# Patient Record
Sex: Female | Born: 1935 | Race: White | Hispanic: No | Smoking: Never smoker
Health system: Southern US, Community
[De-identification: ages and names within clinical notes are randomized; demographics above are authoritative.]

## PROBLEM LIST (undated history)

## (undated) DIAGNOSIS — N189 Chronic kidney disease, unspecified: Secondary | ICD-10-CM

## (undated) DIAGNOSIS — I214 Non-ST elevation (NSTEMI) myocardial infarction: Secondary | ICD-10-CM

## (undated) DIAGNOSIS — E119 Type 2 diabetes mellitus without complications: Secondary | ICD-10-CM

## (undated) DIAGNOSIS — Z8673 Personal history of transient ischemic attack (TIA), and cerebral infarction without residual deficits: Secondary | ICD-10-CM

## (undated) DIAGNOSIS — I509 Heart failure, unspecified: Secondary | ICD-10-CM

## (undated) DIAGNOSIS — M8580 Other specified disorders of bone density and structure, unspecified site: Secondary | ICD-10-CM

## (undated) DIAGNOSIS — I2510 Atherosclerotic heart disease of native coronary artery without angina pectoris: Secondary | ICD-10-CM

## (undated) DIAGNOSIS — C50919 Malignant neoplasm of unspecified site of unspecified female breast: Secondary | ICD-10-CM

## (undated) DIAGNOSIS — E782 Mixed hyperlipidemia: Secondary | ICD-10-CM

## (undated) DIAGNOSIS — D649 Anemia, unspecified: Secondary | ICD-10-CM

## (undated) DIAGNOSIS — K219 Gastro-esophageal reflux disease without esophagitis: Secondary | ICD-10-CM

## (undated) DIAGNOSIS — G510 Bell's palsy: Secondary | ICD-10-CM

## (undated) DIAGNOSIS — I480 Paroxysmal atrial fibrillation: Secondary | ICD-10-CM

## (undated) DIAGNOSIS — M109 Gout, unspecified: Secondary | ICD-10-CM

## (undated) DIAGNOSIS — K2100 Gastro-esophageal reflux disease with esophagitis, without bleeding: Secondary | ICD-10-CM

## (undated) DIAGNOSIS — I10 Essential (primary) hypertension: Secondary | ICD-10-CM

## (undated) DIAGNOSIS — Z85828 Personal history of other malignant neoplasm of skin: Secondary | ICD-10-CM

## (undated) DIAGNOSIS — E041 Nontoxic single thyroid nodule: Secondary | ICD-10-CM

## (undated) HISTORY — DX: Mixed hyperlipidemia: E78.2

## (undated) HISTORY — DX: Personal history of transient ischemic attack (TIA), and cerebral infarction without residual deficits: Z86.73

## (undated) HISTORY — DX: Bell's palsy: G51.0

## (undated) HISTORY — DX: Personal history of other malignant neoplasm of skin: Z85.828

## (undated) HISTORY — DX: Type 2 diabetes mellitus without complications: E11.9

## (undated) HISTORY — DX: Malignant neoplasm of unspecified site of unspecified female breast: C50.919

## (undated) HISTORY — DX: Heart failure, unspecified: I50.9

## (undated) HISTORY — DX: Gout, unspecified: M10.9

## (undated) HISTORY — DX: Nontoxic single thyroid nodule: E04.1

## (undated) HISTORY — PX: APPENDECTOMY: SHX54

## (undated) HISTORY — DX: Gastro-esophageal reflux disease with esophagitis, without bleeding: K21.00

## (undated) HISTORY — DX: Gastro-esophageal reflux disease without esophagitis: K21.9

## (undated) HISTORY — DX: Other specified disorders of bone density and structure, unspecified site: M85.80

## (undated) HISTORY — DX: Essential (primary) hypertension: I10

## (undated) HISTORY — DX: Gastro-esophageal reflux disease with esophagitis: K21.0

## (undated) HISTORY — DX: Atherosclerotic heart disease of native coronary artery without angina pectoris: I25.10

## (undated) HISTORY — DX: Paroxysmal atrial fibrillation: I48.0

## (undated) HISTORY — PX: HYSTEROSCOPY: SHX211

## (undated) HISTORY — PX: ABDOMINAL HYSTERECTOMY: SHX81

---

## 1996-01-28 DIAGNOSIS — C50919 Malignant neoplasm of unspecified site of unspecified female breast: Secondary | ICD-10-CM

## 1996-01-28 HISTORY — DX: Malignant neoplasm of unspecified site of unspecified female breast: C50.919

## 1996-01-28 HISTORY — PX: OTHER SURGICAL HISTORY: SHX169

## 2011-02-28 DIAGNOSIS — E041 Nontoxic single thyroid nodule: Secondary | ICD-10-CM

## 2011-02-28 HISTORY — DX: Nontoxic single thyroid nodule: E04.1

## 2011-03-04 DIAGNOSIS — I1A0 Resistant hypertension: Secondary | ICD-10-CM | POA: Diagnosis present

## 2011-03-04 DIAGNOSIS — E119 Type 2 diabetes mellitus without complications: Secondary | ICD-10-CM

## 2011-03-04 DIAGNOSIS — I10 Essential (primary) hypertension: Secondary | ICD-10-CM | POA: Diagnosis present

## 2011-03-04 DIAGNOSIS — E785 Hyperlipidemia, unspecified: Secondary | ICD-10-CM | POA: Diagnosis present

## 2011-03-18 ENCOUNTER — Other Ambulatory Visit: Payer: Self-pay | Admitting: Endocrinology

## 2011-03-18 DIAGNOSIS — E042 Nontoxic multinodular goiter: Secondary | ICD-10-CM

## 2011-04-02 ENCOUNTER — Ambulatory Visit
Admission: RE | Admit: 2011-04-02 | Discharge: 2011-04-02 | Disposition: A | Discharge status: Home / Self Care | Payer: Medicare Other | Source: Ambulatory Visit | Attending: Endocrinology | Admitting: Endocrinology

## 2011-04-02 ENCOUNTER — Other Ambulatory Visit (HOSPITAL_COMMUNITY)
Admission: RE | Admit: 2011-04-02 | Discharge: 2011-04-02 | Disposition: A | Discharge status: Home / Self Care | Payer: Medicare Other | Source: Ambulatory Visit | Attending: Interventional Radiology | Admitting: Interventional Radiology

## 2011-04-02 DIAGNOSIS — E042 Nontoxic multinodular goiter: Secondary | ICD-10-CM

## 2011-04-02 DIAGNOSIS — E049 Nontoxic goiter, unspecified: Secondary | ICD-10-CM | POA: Insufficient documentation

## 2012-02-12 ENCOUNTER — Other Ambulatory Visit: Payer: Self-pay | Admitting: Endocrinology

## 2012-02-12 DIAGNOSIS — E049 Nontoxic goiter, unspecified: Secondary | ICD-10-CM

## 2012-04-05 ENCOUNTER — Ambulatory Visit
Admission: RE | Admit: 2012-04-05 | Discharge: 2012-04-05 | Disposition: A | Discharge status: Home / Self Care | Payer: Medicare Other | Source: Ambulatory Visit | Attending: Endocrinology | Admitting: Endocrinology

## 2012-04-05 DIAGNOSIS — E049 Nontoxic goiter, unspecified: Secondary | ICD-10-CM

## 2014-01-27 HISTORY — PX: CATARACT EXTRACTION: SUR2

## 2014-07-20 ENCOUNTER — Other Ambulatory Visit: Payer: Self-pay | Admitting: Endocrinology

## 2014-07-20 DIAGNOSIS — E049 Nontoxic goiter, unspecified: Secondary | ICD-10-CM

## 2014-09-29 ENCOUNTER — Other Ambulatory Visit: Payer: Medicare Other

## 2015-02-20 DIAGNOSIS — H25041 Posterior subcapsular polar age-related cataract, right eye: Secondary | ICD-10-CM | POA: Diagnosis not present

## 2015-02-20 DIAGNOSIS — H21561 Pupillary abnormality, right eye: Secondary | ICD-10-CM | POA: Diagnosis not present

## 2015-02-20 DIAGNOSIS — H2511 Age-related nuclear cataract, right eye: Secondary | ICD-10-CM | POA: Diagnosis not present

## 2015-02-20 DIAGNOSIS — H25811 Combined forms of age-related cataract, right eye: Secondary | ICD-10-CM | POA: Diagnosis not present

## 2015-04-13 DIAGNOSIS — I10 Essential (primary) hypertension: Secondary | ICD-10-CM | POA: Diagnosis not present

## 2015-04-13 DIAGNOSIS — E119 Type 2 diabetes mellitus without complications: Secondary | ICD-10-CM | POA: Diagnosis not present

## 2015-04-23 DIAGNOSIS — I10 Essential (primary) hypertension: Secondary | ICD-10-CM | POA: Diagnosis not present

## 2015-04-23 DIAGNOSIS — E1165 Type 2 diabetes mellitus with hyperglycemia: Secondary | ICD-10-CM | POA: Diagnosis not present

## 2015-04-23 DIAGNOSIS — Z789 Other specified health status: Secondary | ICD-10-CM | POA: Diagnosis not present

## 2015-04-23 DIAGNOSIS — M10072 Idiopathic gout, left ankle and foot: Secondary | ICD-10-CM | POA: Diagnosis not present

## 2015-05-02 DIAGNOSIS — Z7189 Other specified counseling: Secondary | ICD-10-CM | POA: Diagnosis not present

## 2015-05-02 DIAGNOSIS — E7800 Pure hypercholesterolemia, unspecified: Secondary | ICD-10-CM | POA: Diagnosis not present

## 2015-05-02 DIAGNOSIS — Z0000 Encounter for general adult medical examination without abnormal findings: Secondary | ICD-10-CM | POA: Diagnosis not present

## 2015-05-02 DIAGNOSIS — Z79899 Other long term (current) drug therapy: Secondary | ICD-10-CM | POA: Diagnosis not present

## 2015-05-02 DIAGNOSIS — Z299 Encounter for prophylactic measures, unspecified: Secondary | ICD-10-CM | POA: Diagnosis not present

## 2015-05-02 DIAGNOSIS — Z1389 Encounter for screening for other disorder: Secondary | ICD-10-CM | POA: Diagnosis not present

## 2015-05-02 DIAGNOSIS — E119 Type 2 diabetes mellitus without complications: Secondary | ICD-10-CM | POA: Diagnosis not present

## 2015-05-02 DIAGNOSIS — I10 Essential (primary) hypertension: Secondary | ICD-10-CM | POA: Diagnosis not present

## 2015-05-02 DIAGNOSIS — R5383 Other fatigue: Secondary | ICD-10-CM | POA: Diagnosis not present

## 2015-05-02 DIAGNOSIS — Z1211 Encounter for screening for malignant neoplasm of colon: Secondary | ICD-10-CM | POA: Diagnosis not present

## 2015-05-29 DIAGNOSIS — E2839 Other primary ovarian failure: Secondary | ICD-10-CM | POA: Diagnosis not present

## 2015-06-28 DIAGNOSIS — E119 Type 2 diabetes mellitus without complications: Secondary | ICD-10-CM | POA: Diagnosis not present

## 2015-06-28 DIAGNOSIS — I10 Essential (primary) hypertension: Secondary | ICD-10-CM | POA: Diagnosis not present

## 2015-07-11 DIAGNOSIS — R1011 Right upper quadrant pain: Secondary | ICD-10-CM | POA: Diagnosis not present

## 2015-07-11 DIAGNOSIS — R1033 Periumbilical pain: Secondary | ICD-10-CM | POA: Diagnosis not present

## 2015-07-17 DIAGNOSIS — R1031 Right lower quadrant pain: Secondary | ICD-10-CM | POA: Diagnosis not present

## 2015-07-17 DIAGNOSIS — I700 Atherosclerosis of aorta: Secondary | ICD-10-CM | POA: Diagnosis not present

## 2015-07-17 DIAGNOSIS — R109 Unspecified abdominal pain: Secondary | ICD-10-CM | POA: Diagnosis not present

## 2015-07-17 DIAGNOSIS — K5730 Diverticulosis of large intestine without perforation or abscess without bleeding: Secondary | ICD-10-CM | POA: Diagnosis not present

## 2015-07-20 DIAGNOSIS — I10 Essential (primary) hypertension: Secondary | ICD-10-CM | POA: Diagnosis not present

## 2015-07-20 DIAGNOSIS — E1165 Type 2 diabetes mellitus with hyperglycemia: Secondary | ICD-10-CM | POA: Diagnosis not present

## 2015-07-20 DIAGNOSIS — Z299 Encounter for prophylactic measures, unspecified: Secondary | ICD-10-CM | POA: Diagnosis not present

## 2015-07-20 DIAGNOSIS — R1011 Right upper quadrant pain: Secondary | ICD-10-CM | POA: Diagnosis not present

## 2015-07-25 DIAGNOSIS — Z7982 Long term (current) use of aspirin: Secondary | ICD-10-CM | POA: Diagnosis not present

## 2015-07-25 DIAGNOSIS — I10 Essential (primary) hypertension: Secondary | ICD-10-CM | POA: Diagnosis not present

## 2015-08-01 DIAGNOSIS — I10 Essential (primary) hypertension: Secondary | ICD-10-CM | POA: Diagnosis not present

## 2015-08-01 DIAGNOSIS — E1165 Type 2 diabetes mellitus with hyperglycemia: Secondary | ICD-10-CM | POA: Diagnosis not present

## 2015-08-01 DIAGNOSIS — E7800 Pure hypercholesterolemia, unspecified: Secondary | ICD-10-CM | POA: Diagnosis not present

## 2015-08-06 DIAGNOSIS — I10 Essential (primary) hypertension: Secondary | ICD-10-CM | POA: Diagnosis not present

## 2015-08-06 DIAGNOSIS — E119 Type 2 diabetes mellitus without complications: Secondary | ICD-10-CM | POA: Diagnosis not present

## 2015-08-09 DIAGNOSIS — Z01419 Encounter for gynecological examination (general) (routine) without abnormal findings: Secondary | ICD-10-CM | POA: Diagnosis not present

## 2015-08-20 DIAGNOSIS — W57XXXA Bitten or stung by nonvenomous insect and other nonvenomous arthropods, initial encounter: Secondary | ICD-10-CM | POA: Diagnosis not present

## 2015-08-20 DIAGNOSIS — S0006XA Insect bite (nonvenomous) of scalp, initial encounter: Secondary | ICD-10-CM | POA: Diagnosis not present

## 2015-09-27 DIAGNOSIS — I10 Essential (primary) hypertension: Secondary | ICD-10-CM | POA: Diagnosis not present

## 2015-09-27 DIAGNOSIS — E119 Type 2 diabetes mellitus without complications: Secondary | ICD-10-CM | POA: Diagnosis not present

## 2015-10-04 DIAGNOSIS — E042 Nontoxic multinodular goiter: Secondary | ICD-10-CM | POA: Diagnosis not present

## 2015-10-09 DIAGNOSIS — R1031 Right lower quadrant pain: Secondary | ICD-10-CM | POA: Diagnosis not present

## 2015-10-10 DIAGNOSIS — E049 Nontoxic goiter, unspecified: Secondary | ICD-10-CM | POA: Diagnosis not present

## 2015-11-09 DIAGNOSIS — E1165 Type 2 diabetes mellitus with hyperglycemia: Secondary | ICD-10-CM | POA: Diagnosis not present

## 2015-11-09 DIAGNOSIS — Z299 Encounter for prophylactic measures, unspecified: Secondary | ICD-10-CM | POA: Diagnosis not present

## 2015-11-09 DIAGNOSIS — Z6822 Body mass index (BMI) 22.0-22.9, adult: Secondary | ICD-10-CM | POA: Diagnosis not present

## 2015-11-09 DIAGNOSIS — C50919 Malignant neoplasm of unspecified site of unspecified female breast: Secondary | ICD-10-CM | POA: Diagnosis not present

## 2015-11-09 DIAGNOSIS — I639 Cerebral infarction, unspecified: Secondary | ICD-10-CM | POA: Diagnosis not present

## 2015-11-13 DIAGNOSIS — L03031 Cellulitis of right toe: Secondary | ICD-10-CM | POA: Diagnosis not present

## 2015-11-14 DIAGNOSIS — E119 Type 2 diabetes mellitus without complications: Secondary | ICD-10-CM | POA: Diagnosis not present

## 2015-11-14 DIAGNOSIS — I10 Essential (primary) hypertension: Secondary | ICD-10-CM | POA: Diagnosis not present

## 2016-01-07 DIAGNOSIS — E119 Type 2 diabetes mellitus without complications: Secondary | ICD-10-CM | POA: Diagnosis not present

## 2016-01-07 DIAGNOSIS — I10 Essential (primary) hypertension: Secondary | ICD-10-CM | POA: Diagnosis not present

## 2016-01-17 DIAGNOSIS — Z1231 Encounter for screening mammogram for malignant neoplasm of breast: Secondary | ICD-10-CM | POA: Diagnosis not present

## 2016-01-17 DIAGNOSIS — Z9011 Acquired absence of right breast and nipple: Secondary | ICD-10-CM | POA: Diagnosis not present

## 2016-02-01 DIAGNOSIS — I10 Essential (primary) hypertension: Secondary | ICD-10-CM | POA: Diagnosis not present

## 2016-02-01 DIAGNOSIS — E119 Type 2 diabetes mellitus without complications: Secondary | ICD-10-CM | POA: Diagnosis not present

## 2016-02-27 DIAGNOSIS — I639 Cerebral infarction, unspecified: Secondary | ICD-10-CM | POA: Diagnosis not present

## 2016-02-27 DIAGNOSIS — Z713 Dietary counseling and surveillance: Secondary | ICD-10-CM | POA: Diagnosis not present

## 2016-02-27 DIAGNOSIS — Z299 Encounter for prophylactic measures, unspecified: Secondary | ICD-10-CM | POA: Diagnosis not present

## 2016-02-27 DIAGNOSIS — Z6822 Body mass index (BMI) 22.0-22.9, adult: Secondary | ICD-10-CM | POA: Diagnosis not present

## 2016-02-27 DIAGNOSIS — C50919 Malignant neoplasm of unspecified site of unspecified female breast: Secondary | ICD-10-CM | POA: Diagnosis not present

## 2016-02-27 DIAGNOSIS — I10 Essential (primary) hypertension: Secondary | ICD-10-CM | POA: Diagnosis not present

## 2016-02-27 DIAGNOSIS — E1165 Type 2 diabetes mellitus with hyperglycemia: Secondary | ICD-10-CM | POA: Diagnosis not present

## 2016-03-03 DIAGNOSIS — I10 Essential (primary) hypertension: Secondary | ICD-10-CM | POA: Diagnosis not present

## 2016-03-03 DIAGNOSIS — E119 Type 2 diabetes mellitus without complications: Secondary | ICD-10-CM | POA: Diagnosis not present

## 2016-03-14 DIAGNOSIS — Z299 Encounter for prophylactic measures, unspecified: Secondary | ICD-10-CM | POA: Diagnosis not present

## 2016-03-14 DIAGNOSIS — Z6823 Body mass index (BMI) 23.0-23.9, adult: Secondary | ICD-10-CM | POA: Diagnosis not present

## 2016-03-14 DIAGNOSIS — D045 Carcinoma in situ of skin of trunk: Secondary | ICD-10-CM | POA: Diagnosis not present

## 2016-03-17 DIAGNOSIS — D485 Neoplasm of uncertain behavior of skin: Secondary | ICD-10-CM | POA: Diagnosis not present

## 2016-03-20 DIAGNOSIS — H5202 Hypermetropia, left eye: Secondary | ICD-10-CM | POA: Diagnosis not present

## 2016-03-20 DIAGNOSIS — E119 Type 2 diabetes mellitus without complications: Secondary | ICD-10-CM | POA: Diagnosis not present

## 2016-04-15 DIAGNOSIS — Z299 Encounter for prophylactic measures, unspecified: Secondary | ICD-10-CM | POA: Diagnosis not present

## 2016-04-15 DIAGNOSIS — C44629 Squamous cell carcinoma of skin of left upper limb, including shoulder: Secondary | ICD-10-CM | POA: Diagnosis not present

## 2016-04-15 DIAGNOSIS — Z6823 Body mass index (BMI) 23.0-23.9, adult: Secondary | ICD-10-CM | POA: Diagnosis not present

## 2016-05-02 DIAGNOSIS — I639 Cerebral infarction, unspecified: Secondary | ICD-10-CM | POA: Diagnosis not present

## 2016-05-02 DIAGNOSIS — N390 Urinary tract infection, site not specified: Secondary | ICD-10-CM | POA: Diagnosis not present

## 2016-05-02 DIAGNOSIS — Z6823 Body mass index (BMI) 23.0-23.9, adult: Secondary | ICD-10-CM | POA: Diagnosis not present

## 2016-05-02 DIAGNOSIS — E7800 Pure hypercholesterolemia, unspecified: Secondary | ICD-10-CM | POA: Diagnosis not present

## 2016-05-02 DIAGNOSIS — R5383 Other fatigue: Secondary | ICD-10-CM | POA: Diagnosis not present

## 2016-05-02 DIAGNOSIS — Z1389 Encounter for screening for other disorder: Secondary | ICD-10-CM | POA: Diagnosis not present

## 2016-05-02 DIAGNOSIS — Z1211 Encounter for screening for malignant neoplasm of colon: Secondary | ICD-10-CM | POA: Diagnosis not present

## 2016-05-02 DIAGNOSIS — R350 Frequency of micturition: Secondary | ICD-10-CM | POA: Diagnosis not present

## 2016-05-02 DIAGNOSIS — Z0000 Encounter for general adult medical examination without abnormal findings: Secondary | ICD-10-CM | POA: Diagnosis not present

## 2016-05-02 DIAGNOSIS — Z299 Encounter for prophylactic measures, unspecified: Secondary | ICD-10-CM | POA: Diagnosis not present

## 2016-05-02 DIAGNOSIS — Z79899 Other long term (current) drug therapy: Secondary | ICD-10-CM | POA: Diagnosis not present

## 2016-05-02 DIAGNOSIS — Z7189 Other specified counseling: Secondary | ICD-10-CM | POA: Diagnosis not present

## 2016-05-02 DIAGNOSIS — I10 Essential (primary) hypertension: Secondary | ICD-10-CM | POA: Diagnosis not present

## 2016-05-19 DIAGNOSIS — D72829 Elevated white blood cell count, unspecified: Secondary | ICD-10-CM | POA: Diagnosis not present

## 2016-06-09 DIAGNOSIS — I10 Essential (primary) hypertension: Secondary | ICD-10-CM | POA: Diagnosis not present

## 2016-06-09 DIAGNOSIS — E119 Type 2 diabetes mellitus without complications: Secondary | ICD-10-CM | POA: Diagnosis not present

## 2016-06-18 DIAGNOSIS — I639 Cerebral infarction, unspecified: Secondary | ICD-10-CM | POA: Diagnosis not present

## 2016-06-18 DIAGNOSIS — Z299 Encounter for prophylactic measures, unspecified: Secondary | ICD-10-CM | POA: Diagnosis not present

## 2016-06-18 DIAGNOSIS — Z6822 Body mass index (BMI) 22.0-22.9, adult: Secondary | ICD-10-CM | POA: Diagnosis not present

## 2016-06-18 DIAGNOSIS — E7800 Pure hypercholesterolemia, unspecified: Secondary | ICD-10-CM | POA: Diagnosis not present

## 2016-06-18 DIAGNOSIS — M8580 Other specified disorders of bone density and structure, unspecified site: Secondary | ICD-10-CM | POA: Diagnosis not present

## 2016-06-18 DIAGNOSIS — I10 Essential (primary) hypertension: Secondary | ICD-10-CM | POA: Diagnosis not present

## 2016-06-18 DIAGNOSIS — I059 Rheumatic mitral valve disease, unspecified: Secondary | ICD-10-CM | POA: Diagnosis not present

## 2016-06-18 DIAGNOSIS — C50919 Malignant neoplasm of unspecified site of unspecified female breast: Secondary | ICD-10-CM | POA: Diagnosis not present

## 2016-06-18 DIAGNOSIS — E1165 Type 2 diabetes mellitus with hyperglycemia: Secondary | ICD-10-CM | POA: Diagnosis not present

## 2016-06-18 DIAGNOSIS — E041 Nontoxic single thyroid nodule: Secondary | ICD-10-CM | POA: Diagnosis not present

## 2016-06-18 DIAGNOSIS — M10072 Idiopathic gout, left ankle and foot: Secondary | ICD-10-CM | POA: Diagnosis not present

## 2016-07-01 DIAGNOSIS — E119 Type 2 diabetes mellitus without complications: Secondary | ICD-10-CM | POA: Diagnosis not present

## 2016-07-01 DIAGNOSIS — I10 Essential (primary) hypertension: Secondary | ICD-10-CM | POA: Diagnosis not present

## 2016-07-17 DIAGNOSIS — B351 Tinea unguium: Secondary | ICD-10-CM | POA: Diagnosis not present

## 2016-07-17 DIAGNOSIS — M79676 Pain in unspecified toe(s): Secondary | ICD-10-CM | POA: Diagnosis not present

## 2016-08-12 DIAGNOSIS — R808 Other proteinuria: Secondary | ICD-10-CM | POA: Diagnosis not present

## 2016-08-12 DIAGNOSIS — Z01419 Encounter for gynecological examination (general) (routine) without abnormal findings: Secondary | ICD-10-CM | POA: Diagnosis not present

## 2016-08-25 DIAGNOSIS — I10 Essential (primary) hypertension: Secondary | ICD-10-CM | POA: Diagnosis not present

## 2016-08-25 DIAGNOSIS — E119 Type 2 diabetes mellitus without complications: Secondary | ICD-10-CM | POA: Diagnosis not present

## 2016-09-16 DIAGNOSIS — I10 Essential (primary) hypertension: Secondary | ICD-10-CM | POA: Diagnosis not present

## 2016-09-16 DIAGNOSIS — I701 Atherosclerosis of renal artery: Secondary | ICD-10-CM | POA: Diagnosis not present

## 2016-09-18 DIAGNOSIS — B351 Tinea unguium: Secondary | ICD-10-CM | POA: Diagnosis not present

## 2016-09-18 DIAGNOSIS — M79676 Pain in unspecified toe(s): Secondary | ICD-10-CM | POA: Diagnosis not present

## 2016-09-24 DIAGNOSIS — M8580 Other specified disorders of bone density and structure, unspecified site: Secondary | ICD-10-CM | POA: Diagnosis not present

## 2016-09-24 DIAGNOSIS — Z299 Encounter for prophylactic measures, unspecified: Secondary | ICD-10-CM | POA: Diagnosis not present

## 2016-09-24 DIAGNOSIS — E7800 Pure hypercholesterolemia, unspecified: Secondary | ICD-10-CM | POA: Diagnosis not present

## 2016-09-24 DIAGNOSIS — I639 Cerebral infarction, unspecified: Secondary | ICD-10-CM | POA: Diagnosis not present

## 2016-09-24 DIAGNOSIS — C50919 Malignant neoplasm of unspecified site of unspecified female breast: Secondary | ICD-10-CM | POA: Diagnosis not present

## 2016-09-24 DIAGNOSIS — I10 Essential (primary) hypertension: Secondary | ICD-10-CM | POA: Diagnosis not present

## 2016-09-24 DIAGNOSIS — Z713 Dietary counseling and surveillance: Secondary | ICD-10-CM | POA: Diagnosis not present

## 2016-09-24 DIAGNOSIS — I059 Rheumatic mitral valve disease, unspecified: Secondary | ICD-10-CM | POA: Diagnosis not present

## 2016-09-24 DIAGNOSIS — E1165 Type 2 diabetes mellitus with hyperglycemia: Secondary | ICD-10-CM | POA: Diagnosis not present

## 2016-09-24 DIAGNOSIS — Z6822 Body mass index (BMI) 22.0-22.9, adult: Secondary | ICD-10-CM | POA: Diagnosis not present

## 2016-10-14 DIAGNOSIS — I10 Essential (primary) hypertension: Secondary | ICD-10-CM | POA: Diagnosis not present

## 2016-10-14 DIAGNOSIS — E119 Type 2 diabetes mellitus without complications: Secondary | ICD-10-CM | POA: Diagnosis not present

## 2016-11-26 DIAGNOSIS — I10 Essential (primary) hypertension: Secondary | ICD-10-CM | POA: Diagnosis not present

## 2016-11-26 DIAGNOSIS — E119 Type 2 diabetes mellitus without complications: Secondary | ICD-10-CM | POA: Diagnosis not present

## 2016-12-15 DIAGNOSIS — E119 Type 2 diabetes mellitus without complications: Secondary | ICD-10-CM | POA: Diagnosis not present

## 2016-12-15 DIAGNOSIS — I10 Essential (primary) hypertension: Secondary | ICD-10-CM | POA: Diagnosis not present

## 2016-12-30 DIAGNOSIS — I10 Essential (primary) hypertension: Secondary | ICD-10-CM | POA: Diagnosis not present

## 2016-12-30 DIAGNOSIS — E119 Type 2 diabetes mellitus without complications: Secondary | ICD-10-CM | POA: Diagnosis not present

## 2017-01-01 DIAGNOSIS — Z299 Encounter for prophylactic measures, unspecified: Secondary | ICD-10-CM | POA: Diagnosis not present

## 2017-01-01 DIAGNOSIS — I10 Essential (primary) hypertension: Secondary | ICD-10-CM | POA: Diagnosis not present

## 2017-01-01 DIAGNOSIS — M542 Cervicalgia: Secondary | ICD-10-CM | POA: Diagnosis not present

## 2017-01-01 DIAGNOSIS — E1165 Type 2 diabetes mellitus with hyperglycemia: Secondary | ICD-10-CM | POA: Diagnosis not present

## 2017-01-01 DIAGNOSIS — Z6823 Body mass index (BMI) 23.0-23.9, adult: Secondary | ICD-10-CM | POA: Diagnosis not present

## 2017-01-07 DIAGNOSIS — M542 Cervicalgia: Secondary | ICD-10-CM | POA: Diagnosis not present

## 2017-01-07 DIAGNOSIS — E042 Nontoxic multinodular goiter: Secondary | ICD-10-CM | POA: Diagnosis not present

## 2017-01-14 DIAGNOSIS — R609 Edema, unspecified: Secondary | ICD-10-CM | POA: Diagnosis not present

## 2017-01-14 DIAGNOSIS — Z713 Dietary counseling and surveillance: Secondary | ICD-10-CM | POA: Diagnosis not present

## 2017-01-14 DIAGNOSIS — Z6822 Body mass index (BMI) 22.0-22.9, adult: Secondary | ICD-10-CM | POA: Diagnosis not present

## 2017-01-14 DIAGNOSIS — I10 Essential (primary) hypertension: Secondary | ICD-10-CM | POA: Diagnosis not present

## 2017-01-27 HISTORY — PX: COLONOSCOPY: SHX174

## 2017-01-28 DIAGNOSIS — E042 Nontoxic multinodular goiter: Secondary | ICD-10-CM | POA: Diagnosis not present

## 2017-01-28 DIAGNOSIS — R229 Localized swelling, mass and lump, unspecified: Secondary | ICD-10-CM | POA: Diagnosis not present

## 2017-02-02 DIAGNOSIS — Z1231 Encounter for screening mammogram for malignant neoplasm of breast: Secondary | ICD-10-CM | POA: Diagnosis not present

## 2017-02-02 DIAGNOSIS — Z9011 Acquired absence of right breast and nipple: Secondary | ICD-10-CM | POA: Diagnosis not present

## 2017-02-04 DIAGNOSIS — E119 Type 2 diabetes mellitus without complications: Secondary | ICD-10-CM | POA: Diagnosis not present

## 2017-02-04 DIAGNOSIS — I10 Essential (primary) hypertension: Secondary | ICD-10-CM | POA: Diagnosis not present

## 2017-02-06 DIAGNOSIS — M7731 Calcaneal spur, right foot: Secondary | ICD-10-CM | POA: Diagnosis not present

## 2017-02-06 DIAGNOSIS — M722 Plantar fascial fibromatosis: Secondary | ICD-10-CM | POA: Diagnosis not present

## 2017-02-06 DIAGNOSIS — M71571 Other bursitis, not elsewhere classified, right ankle and foot: Secondary | ICD-10-CM | POA: Diagnosis not present

## 2017-02-06 DIAGNOSIS — M76821 Posterior tibial tendinitis, right leg: Secondary | ICD-10-CM | POA: Diagnosis not present

## 2017-02-13 DIAGNOSIS — M71571 Other bursitis, not elsewhere classified, right ankle and foot: Secondary | ICD-10-CM | POA: Diagnosis not present

## 2017-02-13 DIAGNOSIS — M722 Plantar fascial fibromatosis: Secondary | ICD-10-CM | POA: Diagnosis not present

## 2017-02-17 DIAGNOSIS — Z299 Encounter for prophylactic measures, unspecified: Secondary | ICD-10-CM | POA: Diagnosis not present

## 2017-02-17 DIAGNOSIS — C44629 Squamous cell carcinoma of skin of left upper limb, including shoulder: Secondary | ICD-10-CM | POA: Diagnosis not present

## 2017-02-17 DIAGNOSIS — C4492 Squamous cell carcinoma of skin, unspecified: Secondary | ICD-10-CM | POA: Diagnosis not present

## 2017-02-17 DIAGNOSIS — Z713 Dietary counseling and surveillance: Secondary | ICD-10-CM | POA: Diagnosis not present

## 2017-02-17 DIAGNOSIS — I10 Essential (primary) hypertension: Secondary | ICD-10-CM | POA: Diagnosis not present

## 2017-02-17 DIAGNOSIS — Z6822 Body mass index (BMI) 22.0-22.9, adult: Secondary | ICD-10-CM | POA: Diagnosis not present

## 2017-03-20 DIAGNOSIS — E1165 Type 2 diabetes mellitus with hyperglycemia: Secondary | ICD-10-CM | POA: Diagnosis not present

## 2017-03-20 DIAGNOSIS — R002 Palpitations: Secondary | ICD-10-CM | POA: Diagnosis not present

## 2017-03-20 DIAGNOSIS — Z6822 Body mass index (BMI) 22.0-22.9, adult: Secondary | ICD-10-CM | POA: Diagnosis not present

## 2017-03-20 DIAGNOSIS — Z299 Encounter for prophylactic measures, unspecified: Secondary | ICD-10-CM | POA: Diagnosis not present

## 2017-03-20 DIAGNOSIS — I10 Essential (primary) hypertension: Secondary | ICD-10-CM | POA: Diagnosis not present

## 2017-03-24 DIAGNOSIS — E119 Type 2 diabetes mellitus without complications: Secondary | ICD-10-CM | POA: Diagnosis not present

## 2017-03-24 DIAGNOSIS — I10 Essential (primary) hypertension: Secondary | ICD-10-CM | POA: Diagnosis not present

## 2017-03-25 DIAGNOSIS — H353121 Nonexudative age-related macular degeneration, left eye, early dry stage: Secondary | ICD-10-CM | POA: Diagnosis not present

## 2017-03-25 DIAGNOSIS — E119 Type 2 diabetes mellitus without complications: Secondary | ICD-10-CM | POA: Diagnosis not present

## 2017-03-25 DIAGNOSIS — H524 Presbyopia: Secondary | ICD-10-CM | POA: Diagnosis not present

## 2017-04-09 DIAGNOSIS — C50919 Malignant neoplasm of unspecified site of unspecified female breast: Secondary | ICD-10-CM | POA: Diagnosis not present

## 2017-04-09 DIAGNOSIS — E1165 Type 2 diabetes mellitus with hyperglycemia: Secondary | ICD-10-CM | POA: Diagnosis not present

## 2017-04-09 DIAGNOSIS — Z6821 Body mass index (BMI) 21.0-21.9, adult: Secondary | ICD-10-CM | POA: Diagnosis not present

## 2017-04-09 DIAGNOSIS — Z299 Encounter for prophylactic measures, unspecified: Secondary | ICD-10-CM | POA: Diagnosis not present

## 2017-04-09 DIAGNOSIS — Z713 Dietary counseling and surveillance: Secondary | ICD-10-CM | POA: Diagnosis not present

## 2017-04-13 DIAGNOSIS — I10 Essential (primary) hypertension: Secondary | ICD-10-CM | POA: Diagnosis not present

## 2017-04-13 DIAGNOSIS — E119 Type 2 diabetes mellitus without complications: Secondary | ICD-10-CM | POA: Diagnosis not present

## 2017-05-04 DIAGNOSIS — E042 Nontoxic multinodular goiter: Secondary | ICD-10-CM | POA: Diagnosis not present

## 2017-05-04 DIAGNOSIS — R229 Localized swelling, mass and lump, unspecified: Secondary | ICD-10-CM | POA: Diagnosis not present

## 2017-05-07 DIAGNOSIS — R5383 Other fatigue: Secondary | ICD-10-CM | POA: Diagnosis not present

## 2017-05-07 DIAGNOSIS — I10 Essential (primary) hypertension: Secondary | ICD-10-CM | POA: Diagnosis not present

## 2017-05-07 DIAGNOSIS — Z299 Encounter for prophylactic measures, unspecified: Secondary | ICD-10-CM | POA: Diagnosis not present

## 2017-05-07 DIAGNOSIS — Z7189 Other specified counseling: Secondary | ICD-10-CM | POA: Diagnosis not present

## 2017-05-07 DIAGNOSIS — Z1339 Encounter for screening examination for other mental health and behavioral disorders: Secondary | ICD-10-CM | POA: Diagnosis not present

## 2017-05-07 DIAGNOSIS — Z789 Other specified health status: Secondary | ICD-10-CM | POA: Diagnosis not present

## 2017-05-07 DIAGNOSIS — Z79899 Other long term (current) drug therapy: Secondary | ICD-10-CM | POA: Diagnosis not present

## 2017-05-07 DIAGNOSIS — Z0000 Encounter for general adult medical examination without abnormal findings: Secondary | ICD-10-CM | POA: Diagnosis not present

## 2017-05-07 DIAGNOSIS — Z1211 Encounter for screening for malignant neoplasm of colon: Secondary | ICD-10-CM | POA: Diagnosis not present

## 2017-05-07 DIAGNOSIS — Z1331 Encounter for screening for depression: Secondary | ICD-10-CM | POA: Diagnosis not present

## 2017-05-07 DIAGNOSIS — R0600 Dyspnea, unspecified: Secondary | ICD-10-CM | POA: Diagnosis not present

## 2017-05-07 DIAGNOSIS — R42 Dizziness and giddiness: Secondary | ICD-10-CM | POA: Diagnosis not present

## 2017-05-07 DIAGNOSIS — Z6821 Body mass index (BMI) 21.0-21.9, adult: Secondary | ICD-10-CM | POA: Diagnosis not present

## 2017-05-13 DIAGNOSIS — R030 Elevated blood-pressure reading, without diagnosis of hypertension: Secondary | ICD-10-CM | POA: Diagnosis not present

## 2017-05-13 DIAGNOSIS — I10 Essential (primary) hypertension: Secondary | ICD-10-CM | POA: Diagnosis not present

## 2017-05-13 DIAGNOSIS — M7989 Other specified soft tissue disorders: Secondary | ICD-10-CM | POA: Diagnosis not present

## 2017-05-19 DIAGNOSIS — E119 Type 2 diabetes mellitus without complications: Secondary | ICD-10-CM | POA: Diagnosis not present

## 2017-05-19 DIAGNOSIS — I10 Essential (primary) hypertension: Secondary | ICD-10-CM | POA: Diagnosis not present

## 2017-05-25 DIAGNOSIS — R0602 Shortness of breath: Secondary | ICD-10-CM | POA: Diagnosis not present

## 2017-06-01 ENCOUNTER — Encounter: Payer: Self-pay | Admitting: Cardiology

## 2017-06-01 NOTE — Progress Notes (Signed)
Cardiology Office Note  Date: 06/03/2017   ID: Rudene Re, DOB 19-Nov-1934, MRN [id number]  PCP: Glenda Chroman, MD  Consulting Cardiologist: Rozann Lesches, MD   Chief Complaint  Patient presents with  . Dyspnea on exertion    History of Present Illness: Calani Gick is an 83 y.o. female referred for cardiology consultation by Dr. Woody Seller for the assessment of dyspnea on exertion.  She states that at least over the last 6 months she has had shortness of breath with activities such as vacuuming or doing other chores, has to stop and rest.  She does not report frank chest pain or tightness.  No palpitations or dizziness.  She has a long-standing history of hypertension and has followed at [facility] for this.  She has a number of medication intolerances, but has generally done well on her current regimen.  She had a recent echocardiogram done at [facility] which showed normal LVEF at 60 to 65% with impaired left ventricular relaxation, mildly calcified aortic and mitral valves with only mild aortic and mild mitral regurgitation.  Also mild to moderate tricuspid regurgitation but upper normal RVSP.  Overall, findings not unexpected in an 83 year old with long-standing hypertension.  I reviewed her current medications which are outlined below.  She reports compliance.  I personally reviewed her ECG today which shows normal sinus rhythm.  She has not undergone any prior ischemic testing.  Past Medical History:  Diagnosis Date  . Bell's palsy   . Breast cancer ([place]) 1998   Right mastectomy  . Essential hypertension   . GERD (gastroesophageal reflux disease)   . Gout   . History of skin cancer    Squamous cell, left shoulder  . History of stroke   . Mixed hyperlipidemia   . Osteopenia   . Reflux esophagitis   . Thyroid nodule   . Type 2 diabetes mellitus ([place])     Past Surgical History:  Procedure Laterality Date  . ABDOMINAL HYSTERECTOMY    . CATARACT EXTRACTION   2016  . Right mastectomy  1998   [facility]    Current Outpatient Medications  Medication Sig Dispense Refill  . aspirin EC 81 MG tablet Take 81 mg by mouth daily.    . Calcium Carbonate-Vitamin D (CALCIUM 500 + D PO) Take by mouth.    . chlorthalidone (HYGROTON) 25 MG tablet Take 25 mg by mouth daily.    Marland Kitchen diltiazem (CARDIZEM CD) 300 MG 24 hr capsule Take 300 mg by mouth as needed.    . labetalol (NORMODYNE) 200 MG tablet Take 200 mg by mouth 2 (two) times daily.     Marland Kitchen losartan (COZAAR) 50 MG tablet Take 50 mg by mouth 2 (two) times daily.     . meclizine (ANTIVERT) 12.5 MG tablet Take 12.5 mg by mouth 3 (three) times daily as needed for dizziness.    . Multiple Vitamin (MULTIVITAMIN) capsule Take 1 capsule by mouth daily.    . multivitamin-lutein (OCUVITE-LUTEIN) CAPS capsule Take 1 capsule by mouth daily.    . Omega-3 Fatty Acids (OMEGA 3 500 PO) Take by mouth.    . saxagliptin HCl (ONGLYZA) 5 MG TABS tablet Take 5 mg by mouth daily.     No current facility-administered medications for this visit.    Allergies:  Amlodipine; Azithromycin; Bactrim [sulfamethoxazole-trimethoprim]; Clonidine derivatives; Coreg [carvedilol]; Evista [raloxifene hcl]; Fosamax [alendronate sodium]; Glipizide; Lipitor [atorvastatin calcium]; Losartan; Pravastatin; and Sulfa antibiotics   Social History: The patient  reports that she  has never smoked. She has never used smokeless tobacco. She reports that she does not drink alcohol or use drugs.   Family History: The patient's family history includes Aneurysm in her sister; Cancer in her sister; Heart attack in her father, maternal uncle, maternal uncle, maternal uncle, paternal aunt, paternal aunt, paternal grandfather, and sister; Heart disease in her maternal uncle, maternal uncle, maternal uncle, paternal aunt, paternal aunt, paternal grandfather, and sister; Stroke in her mother.   ROS:  Please see the history of present illness. Otherwise, complete review of  systems is positive for occasional ankle swelling.  All other systems are reviewed and negative.   Physical Exam: VS:  BP (!) 150/58   Pulse 61   Ht 5\' 6"  (1.676 m)   Wt 141 lb (64 kg)   SpO2 98%   BMI 22.76 kg/m , BMI Body mass index is 22.76 kg/m.  Wt Readings from Last 3 Encounters:  06/03/17 141 lb (64 kg)  04/09/17 143 lb (64.9 kg)    General: Elderly woman, appears comfortable at rest. HEENT: Conjunctiva and lids normal, oropharynx clear. Neck: Supple, no elevated JVP or carotid bruits, no thyromegaly. Lungs: Clear to auscultation, nonlabored breathing at rest. Cardiac: Regular rate and rhythm, no S3, soft systolic murmur, no pericardial rub. Abdomen: Soft, nontender, bowel sounds present. Extremities: No pitting edema, distal pulses 2+. Skin: Warm and dry. Musculoskeletal: No kyphosis. Neuropsychiatric: Alert and oriented x3, affect grossly appropriate.  ECG: There is no old tracing for review today.  Other Studies Reviewed Today:  Echocardiogram 05/25/2017 [facility]): Mild LVH with LVEF 60 to 65%, impaired left ventricular relaxation, normal biatrial chamber size, mildly calcified aortic valve with mild aortic regurgitation, mild mitral annular calcification with mildly thickened leaflets and mild mitral regurgitation, mild to moderate tricuspid regurgitation with RVSP 30 to 40 mmHg, no pericardial effusion.  Assessment and Plan:  1.  Dyspnea on exertion.  Baseline ECG is normal.  Recent echocardiogram shows normal LVEF with impaired diastolic relaxation, not unexpected in the setting of long-standing hypertension.  No major valvular abnormalities to explain symptoms.  Additional cardiac risk factors include hyperlipidemia, type 2 diabetes mellitus, history of stroke.  She has not undergone prior ischemic testing and we will arrange a [place] for further evaluation.  2.  Essential hypertension.  She follows at [facility] and with Dr. Woody Seller for this.  No  changes made to her current regimen.  3.  Mixed hyperlipidemia with statin intolerance.  She is taking omega-3 supplements.  Continues to follow with Dr. Woody Seller.  4.  History of stroke with no reported major residua.  She is on aspirin daily.  Current medicines were reviewed with the patient today.   Orders Placed This Encounter  Procedures  . NM Myocar Multi W/Spect W/Wall Motion / EF  . EKG 12-Lead    Disposition: Call with test results.  Signed, Satira Sark, MD, [facility] 06/03/2017 10:59 AM    [place] [facility] [place], [place], [place] [address] Phone: [phone]; Fax: [phone]

## 2017-06-02 ENCOUNTER — Encounter: Payer: Self-pay | Admitting: *Deleted

## 2017-06-02 DIAGNOSIS — R195 Other fecal abnormalities: Secondary | ICD-10-CM | POA: Diagnosis not present

## 2017-06-03 ENCOUNTER — Encounter: Payer: Self-pay | Admitting: Cardiology

## 2017-06-03 ENCOUNTER — Ambulatory Visit (INDEPENDENT_AMBULATORY_CARE_PROVIDER_SITE_OTHER): Payer: Medicare Other | Admitting: Cardiology

## 2017-06-03 ENCOUNTER — Telehealth: Payer: Self-pay | Admitting: Cardiology

## 2017-06-03 VITALS — BP 150/58 | HR 61 | Ht 66.0 in | Wt 141.0 lb

## 2017-06-03 DIAGNOSIS — E782 Mixed hyperlipidemia: Secondary | ICD-10-CM

## 2017-06-03 DIAGNOSIS — Z8673 Personal history of transient ischemic attack (TIA), and cerebral infarction without residual deficits: Secondary | ICD-10-CM | POA: Diagnosis not present

## 2017-06-03 DIAGNOSIS — I10 Essential (primary) hypertension: Secondary | ICD-10-CM | POA: Diagnosis not present

## 2017-06-03 DIAGNOSIS — R0609 Other forms of dyspnea: Secondary | ICD-10-CM

## 2017-06-03 NOTE — Patient Instructions (Signed)
Medication Instructions:  Your physician recommends that you continue on your current medications as directed. Please refer to the Current Medication list given to you today.  Labwork: NONE  Testing/Procedures: Your physician has requested that you have a lexiscan myoview. For further information please visit [email or web address]. Please follow instruction sheet, as given.  Follow-Up: Your physician recommends that you schedule a follow-up appointment PENDING TEST RESULTS  Any Other Special Instructions Will Be Listed Below (If Applicable).  If you need a refill on your cardiac medications before your next appointment, please call your pharmacy. 

## 2017-06-03 NOTE — Telephone Encounter (Signed)
Pre-cert Verification for the following procedure   lexican scheduled for 06/12/17 @ [facility]

## 2017-06-04 DIAGNOSIS — E1165 Type 2 diabetes mellitus with hyperglycemia: Secondary | ICD-10-CM | POA: Diagnosis not present

## 2017-06-04 DIAGNOSIS — Z299 Encounter for prophylactic measures, unspecified: Secondary | ICD-10-CM | POA: Diagnosis not present

## 2017-06-04 DIAGNOSIS — Z6821 Body mass index (BMI) 21.0-21.9, adult: Secondary | ICD-10-CM | POA: Diagnosis not present

## 2017-06-04 DIAGNOSIS — I10 Essential (primary) hypertension: Secondary | ICD-10-CM | POA: Diagnosis not present

## 2017-06-04 DIAGNOSIS — C50919 Malignant neoplasm of unspecified site of unspecified female breast: Secondary | ICD-10-CM | POA: Diagnosis not present

## 2017-06-04 DIAGNOSIS — R42 Dizziness and giddiness: Secondary | ICD-10-CM | POA: Diagnosis not present

## 2017-06-04 DIAGNOSIS — R0600 Dyspnea, unspecified: Secondary | ICD-10-CM | POA: Diagnosis not present

## 2017-06-08 DIAGNOSIS — R195 Other fecal abnormalities: Secondary | ICD-10-CM | POA: Diagnosis not present

## 2017-06-08 DIAGNOSIS — Z881 Allergy status to other antibiotic agents status: Secondary | ICD-10-CM | POA: Diagnosis not present

## 2017-06-08 DIAGNOSIS — Z800 Family history of malignant neoplasm of digestive organs: Secondary | ICD-10-CM | POA: Diagnosis not present

## 2017-06-08 DIAGNOSIS — I10 Essential (primary) hypertension: Secondary | ICD-10-CM | POA: Diagnosis not present

## 2017-06-08 DIAGNOSIS — Z853 Personal history of malignant neoplasm of breast: Secondary | ICD-10-CM | POA: Diagnosis not present

## 2017-06-08 DIAGNOSIS — Z79899 Other long term (current) drug therapy: Secondary | ICD-10-CM | POA: Diagnosis not present

## 2017-06-08 DIAGNOSIS — K921 Melena: Secondary | ICD-10-CM | POA: Diagnosis not present

## 2017-06-08 DIAGNOSIS — E119 Type 2 diabetes mellitus without complications: Secondary | ICD-10-CM | POA: Diagnosis not present

## 2017-06-08 DIAGNOSIS — Z7982 Long term (current) use of aspirin: Secondary | ICD-10-CM | POA: Diagnosis not present

## 2017-06-08 DIAGNOSIS — Z888 Allergy status to other drugs, medicaments and biological substances status: Secondary | ICD-10-CM | POA: Diagnosis not present

## 2017-06-08 DIAGNOSIS — Z882 Allergy status to sulfonamides status: Secondary | ICD-10-CM | POA: Diagnosis not present

## 2017-06-12 ENCOUNTER — Encounter (HOSPITAL_COMMUNITY)
Admission: RE | Admit: 2017-06-12 | Discharge: 2017-06-12 | Disposition: A | Discharge status: Home / Self Care | Payer: Medicare Other | Source: Ambulatory Visit | Attending: Cardiology | Admitting: Cardiology

## 2017-06-12 ENCOUNTER — Encounter (HOSPITAL_BASED_OUTPATIENT_CLINIC_OR_DEPARTMENT_OTHER)
Admission: RE | Admit: 2017-06-12 | Discharge: 2017-06-12 | Disposition: A | Discharge status: Home / Self Care | Payer: Medicare Other | Source: Ambulatory Visit | Attending: Cardiology | Admitting: Cardiology

## 2017-06-12 DIAGNOSIS — R0609 Other forms of dyspnea: Secondary | ICD-10-CM

## 2017-06-12 LAB — NM MYOCAR MULTI W/SPECT W/WALL MOTION / EF
CHL CUP NUCLEAR SDS: 0
CHL CUP NUCLEAR SRS: 1
CHL CUP NUCLEAR SSS: 1
LV sys vol: 23 mL
LVDIAVOL: 70 mL (ref 46–106)
NUC STRESS TID: 1.02
Peak HR: 75 {beats}/min
RATE: 0.26
Rest HR: 61 {beats}/min

## 2017-06-12 MED ORDER — SODIUM CHLORIDE 0.9% FLUSH
INTRAVENOUS | Status: AC
  Administered 2017-06-12: 10 mL via INTRAVENOUS
  Filled 2017-06-12: qty 10

## 2017-06-12 MED ORDER — TECHNETIUM TC 99M TETROFOSMIN IV KIT
10.0000 | PACK | Freq: Once | INTRAVENOUS | Status: AC | PRN
  Administered 2017-06-12: 11 via INTRAVENOUS

## 2017-06-12 MED ORDER — TECHNETIUM TC 99M TETROFOSMIN IV KIT
30.0000 | PACK | Freq: Once | INTRAVENOUS | Status: AC | PRN
  Administered 2017-06-12: 30 via INTRAVENOUS

## 2017-06-12 MED ORDER — REGADENOSON 0.4 MG/5ML IV SOLN
INTRAVENOUS | Status: AC
  Administered 2017-06-12: 0.4 mg via INTRAVENOUS
  Filled 2017-06-12: qty 5

## 2017-06-15 ENCOUNTER — Telehealth: Payer: Self-pay | Admitting: *Deleted

## 2017-06-15 MED ORDER — ISOSORBIDE MONONITRATE ER 30 MG PO TB24: 30.0000 mg | ORAL_TABLET | Freq: Every evening | ORAL | 3 refills | Status: DC

## 2017-06-15 NOTE — Telephone Encounter (Signed)
Patient informed and copy sent to PCP. 

## 2017-06-15 NOTE — Telephone Encounter (Signed)
-----   Message from Satira Sark, MD sent at 06/14/2017  2:13 PM EDT ----- Results reviewed. Overall low risk results, but not completely normal (soft tissue attenuation artifact versus mild anterior ischemia). Would add Imdur 30 mg daily in the evening and otherwise continue medical therapy. We can see her back in 6 months, sooner if symptoms worsen. A copy of this test should be forwarded to Glenda Chroman, MD.

## 2017-06-18 DIAGNOSIS — E2839 Other primary ovarian failure: Secondary | ICD-10-CM | POA: Diagnosis not present

## 2017-06-23 DIAGNOSIS — I10 Essential (primary) hypertension: Secondary | ICD-10-CM | POA: Diagnosis not present

## 2017-06-23 DIAGNOSIS — R195 Other fecal abnormalities: Secondary | ICD-10-CM | POA: Diagnosis not present

## 2017-06-23 DIAGNOSIS — E119 Type 2 diabetes mellitus without complications: Secondary | ICD-10-CM | POA: Diagnosis not present

## 2017-06-23 DIAGNOSIS — Z6823 Body mass index (BMI) 23.0-23.9, adult: Secondary | ICD-10-CM | POA: Diagnosis not present

## 2017-07-01 DIAGNOSIS — I10 Essential (primary) hypertension: Secondary | ICD-10-CM | POA: Diagnosis not present

## 2017-07-01 DIAGNOSIS — E119 Type 2 diabetes mellitus without complications: Secondary | ICD-10-CM | POA: Diagnosis not present

## 2017-07-16 DIAGNOSIS — I10 Essential (primary) hypertension: Secondary | ICD-10-CM | POA: Diagnosis not present

## 2017-07-16 DIAGNOSIS — Z9011 Acquired absence of right breast and nipple: Secondary | ICD-10-CM | POA: Diagnosis not present

## 2017-07-16 DIAGNOSIS — Z6821 Body mass index (BMI) 21.0-21.9, adult: Secondary | ICD-10-CM | POA: Diagnosis not present

## 2017-07-16 DIAGNOSIS — C50919 Malignant neoplasm of unspecified site of unspecified female breast: Secondary | ICD-10-CM | POA: Diagnosis not present

## 2017-07-16 DIAGNOSIS — E1165 Type 2 diabetes mellitus with hyperglycemia: Secondary | ICD-10-CM | POA: Diagnosis not present

## 2017-07-16 DIAGNOSIS — E7800 Pure hypercholesterolemia, unspecified: Secondary | ICD-10-CM | POA: Diagnosis not present

## 2017-07-16 DIAGNOSIS — Z299 Encounter for prophylactic measures, unspecified: Secondary | ICD-10-CM | POA: Diagnosis not present

## 2017-08-13 DIAGNOSIS — I10 Essential (primary) hypertension: Secondary | ICD-10-CM | POA: Diagnosis not present

## 2017-08-13 DIAGNOSIS — E119 Type 2 diabetes mellitus without complications: Secondary | ICD-10-CM | POA: Diagnosis not present

## 2017-08-24 DIAGNOSIS — C50911 Malignant neoplasm of unspecified site of right female breast: Secondary | ICD-10-CM | POA: Diagnosis not present

## 2017-08-24 DIAGNOSIS — Z6824 Body mass index (BMI) 24.0-24.9, adult: Secondary | ICD-10-CM | POA: Diagnosis not present

## 2017-09-10 DIAGNOSIS — I10 Essential (primary) hypertension: Secondary | ICD-10-CM | POA: Diagnosis not present

## 2017-09-10 DIAGNOSIS — E119 Type 2 diabetes mellitus without complications: Secondary | ICD-10-CM | POA: Diagnosis not present

## 2017-09-15 NOTE — Progress Notes (Signed)
Cardiology Office Note  Date: 09/16/2017   ID: Rudene Re, DOB [birth year], MRN [id number]  PCP: Glenda Chroman, MD  Primary Cardiologist: Rozann Lesches, MD   Chief Complaint  Patient presents with  . Palpitations    History of Present Illness: Crystal [name] is an 83 y.o. female seen in consultation in May of this year with dyspnea on exertion.  She presents back to the office complaining in the interim of a sense of palpitations like a skip, sometimes occurs in the evening, occasionally when blood pressure is elevated.  She does not report chest pain or breathlessness with these episodes and has had no syncope.  She states that she has been compliant with her medications, outlined below.  She still sees Dr. Woody Seller routinely.  Lexiscan Myoview from May is outlined below.  Results were overall low risk.  Past Medical History:  Diagnosis Date  . Bell's palsy   . Breast cancer ([place]) 1998   Right mastectomy  . Essential hypertension   . GERD (gastroesophageal reflux disease)   . Gout   . History of skin cancer    Squamous cell, left shoulder  . History of stroke   . Mixed hyperlipidemia   . Osteopenia   . Reflux esophagitis   . Thyroid nodule   . Type 2 diabetes mellitus ([name])     Past Surgical History:  Procedure Laterality Date  . ABDOMINAL HYSTERECTOMY    . CATARACT EXTRACTION  2016  . Right mastectomy  1998   [facility]    Current Outpatient Medications  Medication Sig Dispense Refill  . aspirin EC 81 MG tablet Take 81 mg by mouth daily.    . Calcium Carbonate-Vitamin D (CALCIUM 500 + D PO) Take by mouth.    . chlorthalidone (HYGROTON) 25 MG tablet Take 25 mg by mouth daily.    Marland Kitchen diltiazem (CARDIZEM CD) 300 MG 24 hr capsule Take 300 mg by mouth as needed.    . isosorbide mononitrate (IMDUR) 30 MG 24 hr tablet Take 1 tablet (30 mg total) by mouth every evening. 90 tablet 3  . labetalol (NORMODYNE) 200 MG tablet Take 200 mg by mouth 2 (two) times daily.       Marland Kitchen losartan (COZAAR) 50 MG tablet Take 50 mg by mouth 2 (two) times daily.     . meclizine (ANTIVERT) 12.5 MG tablet Take 12.5 mg by mouth 3 (three) times daily as needed for dizziness.    . Multiple Vitamin (MULTIVITAMIN) capsule Take 1 capsule by mouth daily.    . multivitamin-lutein (OCUVITE-LUTEIN) CAPS capsule Take 1 capsule by mouth daily.    . Omega-3 Fatty Acids (OMEGA 3 500 PO) Take by mouth.    . saxagliptin HCl (ONGLYZA) 5 MG TABS tablet Take 5 mg by mouth daily.     No current facility-administered medications for this visit.    Allergies:  Amlodipine; Azithromycin; Bactrim [sulfamethoxazole-trimethoprim]; Clonidine derivatives; Coreg [carvedilol]; Evista [raloxifene hcl]; Fosamax [alendronate sodium]; Glipizide; Lipitor [atorvastatin calcium]; Losartan; Pravastatin; and Sulfa antibiotics   Social History: The patient  reports that she has never smoked. She has never used smokeless tobacco. She reports that she does not drink alcohol or use drugs.   ROS:  Please see the history of present illness. Otherwise, complete review of systems is positive for anxiety.  All other systems are reviewed and negative.   Physical Exam: VS:  BP (!) 160/78 (BP Location: Left Arm, Cuff Size: Normal)   Pulse 71   Ht 5'  5" (1.651 m)   Wt 143 lb 9.6 oz (65.1 kg)   SpO2 99%   BMI 23.90 kg/m , BMI Body mass index is 23.9 kg/m.  Wt Readings from Last 3 Encounters:  09/16/17 143 lb 9.6 oz (65.1 kg)  06/03/17 141 lb (64 kg)  04/09/17 143 lb (64.9 kg)    General: Elderly woman, appears comfortable at rest. HEENT: Conjunctiva and lids normal, oropharynx clear. Neck: Supple, no elevated JVP or carotid bruits, no thyromegaly. Lungs: Clear to auscultation, nonlabored breathing at rest. Cardiac: Regular rate and rhythm, no S3, soft systolic murmur. Abdomen: Soft, nontender, bowel sounds present. Extremities: No pitting edema, distal pulses 2+. Skin: Warm and dry. Musculoskeletal: No  kyphosis. Neuropsychiatric: Alert and oriented x3, affect grossly appropriate.  ECG: I personally reviewed the tracing from 06/03/2017 which showed sinus rhythm.  Other Studies Reviewed Today:  Echocardiogram 05/25/2017 [facility]): Mild LVH with LVEF 60 to 65%, impaired left ventricular relaxation, normal biatrial chamber size, mildly calcified aortic valve with mild aortic regurgitation, mild mitral annular calcification with mildly thickened leaflets and mild mitral regurgitation, mild to moderate tricuspid regurgitation with RVSP 30 to 40 mmHg, no pericardial effusion.  Lexiscan Myoview 06/12/2017:  There was no ST segment deviation noted during stress.  Defect 1: There is a medium defect of mild severity present in the mid anterior, mid anteroseptal, mid anterolateral and apical anterior location. Regional wall motion is normal. Hence, variable soft tissue attenuation is suspected. However, a mild degree of underlying ischemia cannot entirely be ruled out. Clinical correlation is recommended.  This is a low risk study.  Nuclear stress EF: 68%.  Assessment and Plan:  1.  Intermittent palpitations as outlined above.  She has had no chest pain or syncope.  She states that these are daily at this point.  We will obtain a 48 to 72-hour cardiac monitor to further investigate.  It sounds like she might be having PVCs based on description of skips.  2.  Essential hypertension, currently on Cardizem CD, labetalol, chlorthalidone and Cozaar.  [facility] follows at [facility] as well.  Current medicines were reviewed with the patient today.   Orders Placed This Encounter  Procedures  . Holter monitor - 48 hour    Disposition: Call with test results.  Signed, Satira Sark, MD, [facility] 09/16/2017 4:53 PM    [place] [facility] [place], [place], [place] [address] Phone: [phone]; Fax: [phone]

## 2017-09-16 ENCOUNTER — Telehealth: Payer: Self-pay | Admitting: Cardiology

## 2017-09-16 ENCOUNTER — Encounter: Payer: Self-pay | Admitting: Cardiology

## 2017-09-16 ENCOUNTER — Ambulatory Visit: Payer: Medicare Other

## 2017-09-16 ENCOUNTER — Ambulatory Visit (INDEPENDENT_AMBULATORY_CARE_PROVIDER_SITE_OTHER): Payer: Medicare Other | Admitting: Cardiology

## 2017-09-16 VITALS — BP 160/78 | HR 71 | Ht 65.0 in | Wt 143.6 lb

## 2017-09-16 DIAGNOSIS — R002 Palpitations: Secondary | ICD-10-CM | POA: Diagnosis not present

## 2017-09-16 DIAGNOSIS — R0609 Other forms of dyspnea: Secondary | ICD-10-CM | POA: Diagnosis not present

## 2017-09-16 DIAGNOSIS — I10 Essential (primary) hypertension: Secondary | ICD-10-CM

## 2017-09-16 NOTE — Telephone Encounter (Signed)
°  Precert needed for: 3 day holter- palpitations   Location: [facility]     Date:

## 2017-09-16 NOTE — Patient Instructions (Signed)
Medication Instructions:  Your physician recommends that you continue on your current medications as directed. Please refer to the Current Medication list given to you today.  Labwork: NONE  Testing/Procedures: Your physician has recommended that you wear a holter monitor FOR 3 DAYS. Holter monitors are medical devices that record the heart's electrical activity. Doctors most often use these monitors to diagnose arrhythmias. Arrhythmias are problems with the speed or rhythm of the heartbeat. The monitor is a small, portable device. You can wear one while you do your normal daily activities. This is usually used to diagnose what is causing palpitations/syncope (passing out).   Follow-Up: Your physician recommends that you schedule a follow-up appointment FOLLOWING TEST RESULTS  Any Other Special Instructions Will Be Listed Below (If Applicable).  If you need a refill on your cardiac medications before your next appointment, please call your pharmacy.

## 2017-09-30 ENCOUNTER — Telehealth: Payer: Self-pay | Admitting: *Deleted

## 2017-09-30 NOTE — Telephone Encounter (Signed)
-----   Message from Satira Sark, MD sent at 09/30/2017  2:54 PM EDT ----- Results reviewed.  Please let her know that the cardiac monitor did not demonstrate any significant arrhythmias.  She has infrequent PACs and PVCs that she is likely feeling as palpitations or skips, but nothing that looks concerning or necessarily suggests a change in her current medical regimen. A copy of this test should be forwarded to Glenda Chroman, MD.

## 2017-09-30 NOTE — Addendum Note (Signed)
Addended by: Merlene Laughter on: 09/30/2017 01:15 PM   Modules accepted: Orders

## 2017-09-30 NOTE — Telephone Encounter (Signed)
Patient informed and copy sent to PCP. 

## 2017-10-09 DIAGNOSIS — E049 Nontoxic goiter, unspecified: Secondary | ICD-10-CM | POA: Diagnosis not present

## 2017-10-12 DIAGNOSIS — E119 Type 2 diabetes mellitus without complications: Secondary | ICD-10-CM | POA: Diagnosis not present

## 2017-10-12 DIAGNOSIS — I10 Essential (primary) hypertension: Secondary | ICD-10-CM | POA: Diagnosis not present

## 2017-10-13 DIAGNOSIS — R030 Elevated blood-pressure reading, without diagnosis of hypertension: Secondary | ICD-10-CM | POA: Diagnosis not present

## 2017-10-13 DIAGNOSIS — I10 Essential (primary) hypertension: Secondary | ICD-10-CM | POA: Diagnosis not present

## 2017-10-13 DIAGNOSIS — Z09 Encounter for follow-up examination after completed treatment for conditions other than malignant neoplasm: Secondary | ICD-10-CM | POA: Diagnosis not present

## 2017-10-13 DIAGNOSIS — I701 Atherosclerosis of renal artery: Secondary | ICD-10-CM | POA: Diagnosis not present

## 2017-10-22 DIAGNOSIS — Z299 Encounter for prophylactic measures, unspecified: Secondary | ICD-10-CM | POA: Diagnosis not present

## 2017-10-22 DIAGNOSIS — Z6821 Body mass index (BMI) 21.0-21.9, adult: Secondary | ICD-10-CM | POA: Diagnosis not present

## 2017-10-22 DIAGNOSIS — E1165 Type 2 diabetes mellitus with hyperglycemia: Secondary | ICD-10-CM | POA: Diagnosis not present

## 2017-10-22 DIAGNOSIS — Z713 Dietary counseling and surveillance: Secondary | ICD-10-CM | POA: Diagnosis not present

## 2017-10-22 DIAGNOSIS — I10 Essential (primary) hypertension: Secondary | ICD-10-CM | POA: Diagnosis not present

## 2017-10-29 DIAGNOSIS — E049 Nontoxic goiter, unspecified: Secondary | ICD-10-CM | POA: Diagnosis not present

## 2017-11-20 DIAGNOSIS — E119 Type 2 diabetes mellitus without complications: Secondary | ICD-10-CM | POA: Diagnosis not present

## 2017-11-20 DIAGNOSIS — I10 Essential (primary) hypertension: Secondary | ICD-10-CM | POA: Diagnosis not present

## 2017-12-02 DIAGNOSIS — I701 Atherosclerosis of renal artery: Secondary | ICD-10-CM | POA: Insufficient documentation

## 2017-12-02 DIAGNOSIS — I10 Essential (primary) hypertension: Secondary | ICD-10-CM | POA: Diagnosis not present

## 2017-12-02 DIAGNOSIS — Z79899 Other long term (current) drug therapy: Secondary | ICD-10-CM | POA: Diagnosis not present

## 2017-12-02 DIAGNOSIS — F419 Anxiety disorder, unspecified: Secondary | ICD-10-CM | POA: Diagnosis not present

## 2017-12-29 DIAGNOSIS — I10 Essential (primary) hypertension: Secondary | ICD-10-CM | POA: Diagnosis not present

## 2017-12-29 DIAGNOSIS — E7800 Pure hypercholesterolemia, unspecified: Secondary | ICD-10-CM | POA: Diagnosis not present

## 2017-12-29 DIAGNOSIS — E1165 Type 2 diabetes mellitus with hyperglycemia: Secondary | ICD-10-CM | POA: Diagnosis not present

## 2017-12-29 DIAGNOSIS — Z299 Encounter for prophylactic measures, unspecified: Secondary | ICD-10-CM | POA: Diagnosis not present

## 2017-12-29 DIAGNOSIS — Z6822 Body mass index (BMI) 22.0-22.9, adult: Secondary | ICD-10-CM | POA: Diagnosis not present

## 2017-12-31 DIAGNOSIS — I10 Essential (primary) hypertension: Secondary | ICD-10-CM | POA: Diagnosis not present

## 2017-12-31 DIAGNOSIS — E119 Type 2 diabetes mellitus without complications: Secondary | ICD-10-CM | POA: Diagnosis not present

## 2018-02-04 DIAGNOSIS — I10 Essential (primary) hypertension: Secondary | ICD-10-CM | POA: Diagnosis not present

## 2018-02-04 DIAGNOSIS — E119 Type 2 diabetes mellitus without complications: Secondary | ICD-10-CM | POA: Diagnosis not present

## 2018-02-10 DIAGNOSIS — F419 Anxiety disorder, unspecified: Secondary | ICD-10-CM | POA: Diagnosis not present

## 2018-02-10 DIAGNOSIS — I701 Atherosclerosis of renal artery: Secondary | ICD-10-CM | POA: Diagnosis not present

## 2018-02-10 DIAGNOSIS — I10 Essential (primary) hypertension: Secondary | ICD-10-CM | POA: Diagnosis not present

## 2018-02-11 DIAGNOSIS — I10 Essential (primary) hypertension: Secondary | ICD-10-CM | POA: Diagnosis not present

## 2018-02-11 DIAGNOSIS — Z6821 Body mass index (BMI) 21.0-21.9, adult: Secondary | ICD-10-CM | POA: Diagnosis not present

## 2018-02-11 DIAGNOSIS — Z299 Encounter for prophylactic measures, unspecified: Secondary | ICD-10-CM | POA: Diagnosis not present

## 2018-02-11 DIAGNOSIS — E1165 Type 2 diabetes mellitus with hyperglycemia: Secondary | ICD-10-CM | POA: Diagnosis not present

## 2018-02-11 DIAGNOSIS — C50919 Malignant neoplasm of unspecified site of unspecified female breast: Secondary | ICD-10-CM | POA: Diagnosis not present

## 2018-02-18 DIAGNOSIS — I10 Essential (primary) hypertension: Secondary | ICD-10-CM | POA: Diagnosis not present

## 2018-02-18 DIAGNOSIS — D045 Carcinoma in situ of skin of trunk: Secondary | ICD-10-CM | POA: Diagnosis not present

## 2018-02-18 DIAGNOSIS — Z6822 Body mass index (BMI) 22.0-22.9, adult: Secondary | ICD-10-CM | POA: Diagnosis not present

## 2018-02-18 DIAGNOSIS — Z299 Encounter for prophylactic measures, unspecified: Secondary | ICD-10-CM | POA: Diagnosis not present

## 2018-02-25 DIAGNOSIS — Z1231 Encounter for screening mammogram for malignant neoplasm of breast: Secondary | ICD-10-CM | POA: Diagnosis not present

## 2018-03-04 DIAGNOSIS — I10 Essential (primary) hypertension: Secondary | ICD-10-CM | POA: Diagnosis not present

## 2018-03-04 DIAGNOSIS — E119 Type 2 diabetes mellitus without complications: Secondary | ICD-10-CM | POA: Diagnosis not present

## 2018-03-16 DIAGNOSIS — Z299 Encounter for prophylactic measures, unspecified: Secondary | ICD-10-CM | POA: Diagnosis not present

## 2018-03-16 DIAGNOSIS — Z6821 Body mass index (BMI) 21.0-21.9, adult: Secondary | ICD-10-CM | POA: Diagnosis not present

## 2018-03-16 DIAGNOSIS — I10 Essential (primary) hypertension: Secondary | ICD-10-CM | POA: Diagnosis not present

## 2018-03-16 DIAGNOSIS — C44622 Squamous cell carcinoma of skin of right upper limb, including shoulder: Secondary | ICD-10-CM | POA: Diagnosis not present

## 2018-03-23 DIAGNOSIS — E1140 Type 2 diabetes mellitus with diabetic neuropathy, unspecified: Secondary | ICD-10-CM | POA: Diagnosis not present

## 2018-03-23 DIAGNOSIS — E1159 Type 2 diabetes mellitus with other circulatory complications: Secondary | ICD-10-CM | POA: Diagnosis not present

## 2018-03-23 DIAGNOSIS — H81393 Other peripheral vertigo, bilateral: Secondary | ICD-10-CM | POA: Diagnosis not present

## 2018-03-25 DIAGNOSIS — E119 Type 2 diabetes mellitus without complications: Secondary | ICD-10-CM | POA: Diagnosis not present

## 2018-03-25 DIAGNOSIS — Z961 Presence of intraocular lens: Secondary | ICD-10-CM | POA: Diagnosis not present

## 2018-04-01 DIAGNOSIS — I10 Essential (primary) hypertension: Secondary | ICD-10-CM | POA: Diagnosis not present

## 2018-04-01 DIAGNOSIS — E119 Type 2 diabetes mellitus without complications: Secondary | ICD-10-CM | POA: Diagnosis not present

## 2018-04-06 DIAGNOSIS — E1165 Type 2 diabetes mellitus with hyperglycemia: Secondary | ICD-10-CM | POA: Diagnosis not present

## 2018-04-06 DIAGNOSIS — Z6822 Body mass index (BMI) 22.0-22.9, adult: Secondary | ICD-10-CM | POA: Diagnosis not present

## 2018-04-06 DIAGNOSIS — E559 Vitamin D deficiency, unspecified: Secondary | ICD-10-CM | POA: Diagnosis not present

## 2018-04-06 DIAGNOSIS — Z299 Encounter for prophylactic measures, unspecified: Secondary | ICD-10-CM | POA: Diagnosis not present

## 2018-04-06 DIAGNOSIS — Z789 Other specified health status: Secondary | ICD-10-CM | POA: Diagnosis not present

## 2018-04-06 DIAGNOSIS — R42 Dizziness and giddiness: Secondary | ICD-10-CM | POA: Diagnosis not present

## 2018-04-06 DIAGNOSIS — I10 Essential (primary) hypertension: Secondary | ICD-10-CM | POA: Diagnosis not present

## 2018-05-07 DIAGNOSIS — I10 Essential (primary) hypertension: Secondary | ICD-10-CM | POA: Diagnosis not present

## 2018-05-07 DIAGNOSIS — E119 Type 2 diabetes mellitus without complications: Secondary | ICD-10-CM | POA: Diagnosis not present

## 2018-06-02 ENCOUNTER — Other Ambulatory Visit: Payer: Self-pay | Admitting: Cardiology

## 2018-06-16 DIAGNOSIS — R42 Dizziness and giddiness: Secondary | ICD-10-CM | POA: Diagnosis not present

## 2018-06-16 DIAGNOSIS — Z299 Encounter for prophylactic measures, unspecified: Secondary | ICD-10-CM | POA: Diagnosis not present

## 2018-06-16 DIAGNOSIS — I10 Essential (primary) hypertension: Secondary | ICD-10-CM | POA: Diagnosis not present

## 2018-06-16 DIAGNOSIS — E1165 Type 2 diabetes mellitus with hyperglycemia: Secondary | ICD-10-CM | POA: Diagnosis not present

## 2018-06-16 DIAGNOSIS — Z6822 Body mass index (BMI) 22.0-22.9, adult: Secondary | ICD-10-CM | POA: Diagnosis not present

## 2018-06-16 DIAGNOSIS — M8580 Other specified disorders of bone density and structure, unspecified site: Secondary | ICD-10-CM | POA: Diagnosis not present

## 2018-06-18 DIAGNOSIS — I10 Essential (primary) hypertension: Secondary | ICD-10-CM | POA: Diagnosis not present

## 2018-06-18 DIAGNOSIS — E119 Type 2 diabetes mellitus without complications: Secondary | ICD-10-CM | POA: Diagnosis not present

## 2018-07-13 DIAGNOSIS — I10 Essential (primary) hypertension: Secondary | ICD-10-CM | POA: Diagnosis not present

## 2018-07-13 DIAGNOSIS — E119 Type 2 diabetes mellitus without complications: Secondary | ICD-10-CM | POA: Diagnosis not present

## 2018-07-15 IMAGING — NM NM MYOCAR MULTI W/SPECT W/WALL MOTION & EF
2 series · 12 of 12 positions shown · non-contrast
Comparison: none

[Series 1: rest · 6.51mm/px · 6 of 64 frames shown]
[frame 6/64]
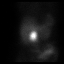
[frame 16/64]
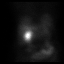
[frame 27/64]
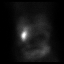
[frame 38/64]
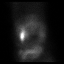
[frame 48/64]
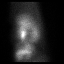
[frame 59/64]
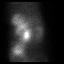

[Series 2: stress gated - gated · 6.51mm/px · 6 of 512 frames shown]
[frame 43/512  full-range]
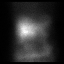
[frame 128/512  full-range]
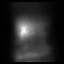
[frame 214/512  full-range]
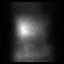
[frame 299/512  full-range]
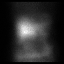
[frame 384/512  full-range]
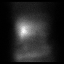
[frame 470/512  full-range]
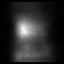

[12 of 12 positions shown; findings below may reference images not displayed]

Canned report from images found in remote index.

Refer to host system for actual result text.

## 2018-07-19 DIAGNOSIS — E1165 Type 2 diabetes mellitus with hyperglycemia: Secondary | ICD-10-CM | POA: Diagnosis not present

## 2018-07-19 DIAGNOSIS — Z1331 Encounter for screening for depression: Secondary | ICD-10-CM | POA: Diagnosis not present

## 2018-07-19 DIAGNOSIS — Z79899 Other long term (current) drug therapy: Secondary | ICD-10-CM | POA: Diagnosis not present

## 2018-07-19 DIAGNOSIS — Z1211 Encounter for screening for malignant neoplasm of colon: Secondary | ICD-10-CM | POA: Diagnosis not present

## 2018-07-19 DIAGNOSIS — Z6822 Body mass index (BMI) 22.0-22.9, adult: Secondary | ICD-10-CM | POA: Diagnosis not present

## 2018-07-19 DIAGNOSIS — Z1339 Encounter for screening examination for other mental health and behavioral disorders: Secondary | ICD-10-CM | POA: Diagnosis not present

## 2018-07-19 DIAGNOSIS — E7800 Pure hypercholesterolemia, unspecified: Secondary | ICD-10-CM | POA: Diagnosis not present

## 2018-07-19 DIAGNOSIS — Z0000 Encounter for general adult medical examination without abnormal findings: Secondary | ICD-10-CM | POA: Diagnosis not present

## 2018-07-19 DIAGNOSIS — I10 Essential (primary) hypertension: Secondary | ICD-10-CM | POA: Diagnosis not present

## 2018-07-19 DIAGNOSIS — R5383 Other fatigue: Secondary | ICD-10-CM | POA: Diagnosis not present

## 2018-07-19 DIAGNOSIS — Z299 Encounter for prophylactic measures, unspecified: Secondary | ICD-10-CM | POA: Diagnosis not present

## 2018-07-19 DIAGNOSIS — Z7189 Other specified counseling: Secondary | ICD-10-CM | POA: Diagnosis not present

## 2018-07-22 DIAGNOSIS — B078 Other viral warts: Secondary | ICD-10-CM | POA: Diagnosis not present

## 2018-07-22 DIAGNOSIS — Z6821 Body mass index (BMI) 21.0-21.9, adult: Secondary | ICD-10-CM | POA: Diagnosis not present

## 2018-07-22 DIAGNOSIS — I10 Essential (primary) hypertension: Secondary | ICD-10-CM | POA: Diagnosis not present

## 2018-07-22 DIAGNOSIS — D485 Neoplasm of uncertain behavior of skin: Secondary | ICD-10-CM | POA: Diagnosis not present

## 2018-07-22 DIAGNOSIS — Z299 Encounter for prophylactic measures, unspecified: Secondary | ICD-10-CM | POA: Diagnosis not present

## 2018-07-29 DIAGNOSIS — E119 Type 2 diabetes mellitus without complications: Secondary | ICD-10-CM | POA: Diagnosis not present

## 2018-07-29 DIAGNOSIS — I10 Essential (primary) hypertension: Secondary | ICD-10-CM | POA: Diagnosis not present

## 2018-08-20 DIAGNOSIS — B079 Viral wart, unspecified: Secondary | ICD-10-CM | POA: Diagnosis not present

## 2018-08-20 DIAGNOSIS — Z299 Encounter for prophylactic measures, unspecified: Secondary | ICD-10-CM | POA: Diagnosis not present

## 2018-08-20 DIAGNOSIS — I10 Essential (primary) hypertension: Secondary | ICD-10-CM | POA: Diagnosis not present

## 2018-08-20 DIAGNOSIS — Z6821 Body mass index (BMI) 21.0-21.9, adult: Secondary | ICD-10-CM | POA: Diagnosis not present

## 2018-08-25 DIAGNOSIS — I10 Essential (primary) hypertension: Secondary | ICD-10-CM | POA: Diagnosis not present

## 2018-09-13 DIAGNOSIS — Z299 Encounter for prophylactic measures, unspecified: Secondary | ICD-10-CM | POA: Diagnosis not present

## 2018-09-13 DIAGNOSIS — E7800 Pure hypercholesterolemia, unspecified: Secondary | ICD-10-CM | POA: Diagnosis not present

## 2018-09-13 DIAGNOSIS — Z6821 Body mass index (BMI) 21.0-21.9, adult: Secondary | ICD-10-CM | POA: Diagnosis not present

## 2018-09-13 DIAGNOSIS — I10 Essential (primary) hypertension: Secondary | ICD-10-CM | POA: Diagnosis not present

## 2018-09-13 DIAGNOSIS — E1165 Type 2 diabetes mellitus with hyperglycemia: Secondary | ICD-10-CM | POA: Diagnosis not present

## 2018-09-13 DIAGNOSIS — C50919 Malignant neoplasm of unspecified site of unspecified female breast: Secondary | ICD-10-CM | POA: Diagnosis not present

## 2018-09-24 DIAGNOSIS — I10 Essential (primary) hypertension: Secondary | ICD-10-CM | POA: Diagnosis not present

## 2018-09-24 DIAGNOSIS — E119 Type 2 diabetes mellitus without complications: Secondary | ICD-10-CM | POA: Diagnosis not present

## 2018-09-27 DIAGNOSIS — I10 Essential (primary) hypertension: Secondary | ICD-10-CM | POA: Diagnosis not present

## 2018-09-30 DIAGNOSIS — Z6821 Body mass index (BMI) 21.0-21.9, adult: Secondary | ICD-10-CM | POA: Diagnosis not present

## 2018-09-30 DIAGNOSIS — Z299 Encounter for prophylactic measures, unspecified: Secondary | ICD-10-CM | POA: Diagnosis not present

## 2018-09-30 DIAGNOSIS — E1165 Type 2 diabetes mellitus with hyperglycemia: Secondary | ICD-10-CM | POA: Diagnosis not present

## 2018-09-30 DIAGNOSIS — C50919 Malignant neoplasm of unspecified site of unspecified female breast: Secondary | ICD-10-CM | POA: Diagnosis not present

## 2018-09-30 DIAGNOSIS — I10 Essential (primary) hypertension: Secondary | ICD-10-CM | POA: Diagnosis not present

## 2018-10-13 DIAGNOSIS — F419 Anxiety disorder, unspecified: Secondary | ICD-10-CM | POA: Diagnosis not present

## 2018-10-13 DIAGNOSIS — I10 Essential (primary) hypertension: Secondary | ICD-10-CM | POA: Diagnosis not present

## 2018-10-13 DIAGNOSIS — I701 Atherosclerosis of renal artery: Secondary | ICD-10-CM | POA: Diagnosis not present

## 2018-10-15 DIAGNOSIS — I10 Essential (primary) hypertension: Secondary | ICD-10-CM | POA: Diagnosis not present

## 2018-10-15 DIAGNOSIS — E119 Type 2 diabetes mellitus without complications: Secondary | ICD-10-CM | POA: Diagnosis not present

## 2018-10-21 DIAGNOSIS — I10 Essential (primary) hypertension: Secondary | ICD-10-CM | POA: Diagnosis not present

## 2018-11-05 DIAGNOSIS — I10 Essential (primary) hypertension: Secondary | ICD-10-CM | POA: Diagnosis not present

## 2018-11-05 DIAGNOSIS — I701 Atherosclerosis of renal artery: Secondary | ICD-10-CM | POA: Diagnosis not present

## 2018-11-10 DIAGNOSIS — I129 Hypertensive chronic kidney disease with stage 1 through stage 4 chronic kidney disease, or unspecified chronic kidney disease: Secondary | ICD-10-CM | POA: Diagnosis not present

## 2018-11-10 DIAGNOSIS — I701 Atherosclerosis of renal artery: Secondary | ICD-10-CM | POA: Diagnosis not present

## 2018-11-10 DIAGNOSIS — N1831 Chronic kidney disease, stage 3a: Secondary | ICD-10-CM | POA: Diagnosis not present

## 2018-11-16 DIAGNOSIS — I10 Essential (primary) hypertension: Secondary | ICD-10-CM | POA: Diagnosis not present

## 2018-11-16 DIAGNOSIS — E119 Type 2 diabetes mellitus without complications: Secondary | ICD-10-CM | POA: Diagnosis not present

## 2018-11-23 DIAGNOSIS — I6381 Other cerebral infarction due to occlusion or stenosis of small artery: Secondary | ICD-10-CM | POA: Diagnosis not present

## 2018-11-23 DIAGNOSIS — Z6821 Body mass index (BMI) 21.0-21.9, adult: Secondary | ICD-10-CM | POA: Diagnosis not present

## 2018-11-23 DIAGNOSIS — R42 Dizziness and giddiness: Secondary | ICD-10-CM | POA: Diagnosis not present

## 2018-11-23 DIAGNOSIS — I10 Essential (primary) hypertension: Secondary | ICD-10-CM | POA: Diagnosis not present

## 2018-11-23 DIAGNOSIS — Z299 Encounter for prophylactic measures, unspecified: Secondary | ICD-10-CM | POA: Diagnosis not present

## 2018-11-30 DIAGNOSIS — H8112 Benign paroxysmal vertigo, left ear: Secondary | ICD-10-CM | POA: Diagnosis not present

## 2018-12-03 DIAGNOSIS — H8112 Benign paroxysmal vertigo, left ear: Secondary | ICD-10-CM | POA: Diagnosis not present

## 2018-12-06 ENCOUNTER — Other Ambulatory Visit: Payer: Self-pay | Admitting: Cardiology

## 2018-12-08 DIAGNOSIS — H8112 Benign paroxysmal vertigo, left ear: Secondary | ICD-10-CM | POA: Diagnosis not present

## 2018-12-13 DIAGNOSIS — H8112 Benign paroxysmal vertigo, left ear: Secondary | ICD-10-CM | POA: Diagnosis not present

## 2018-12-20 DIAGNOSIS — H8112 Benign paroxysmal vertigo, left ear: Secondary | ICD-10-CM | POA: Diagnosis not present

## 2018-12-27 DIAGNOSIS — H8112 Benign paroxysmal vertigo, left ear: Secondary | ICD-10-CM | POA: Diagnosis not present

## 2018-12-30 DIAGNOSIS — I10 Essential (primary) hypertension: Secondary | ICD-10-CM | POA: Diagnosis not present

## 2018-12-30 DIAGNOSIS — E119 Type 2 diabetes mellitus without complications: Secondary | ICD-10-CM | POA: Diagnosis not present

## 2019-01-30 ENCOUNTER — Encounter (HOSPITAL_COMMUNITY): Payer: Self-pay | Admitting: Emergency Medicine

## 2019-01-30 ENCOUNTER — Other Ambulatory Visit: Payer: Self-pay

## 2019-01-30 ENCOUNTER — Inpatient Hospital Stay (HOSPITAL_COMMUNITY)
Admission: EM | Admit: 2019-01-30 | Discharge: 2019-02-02 | DRG: 247 | Disposition: A | Discharge status: Home / Self Care | Payer: Medicare Other | Attending: Cardiovascular Disease | Admitting: Cardiovascular Disease

## 2019-01-30 ENCOUNTER — Emergency Department (HOSPITAL_COMMUNITY): Payer: Medicare Other

## 2019-01-30 ENCOUNTER — Inpatient Hospital Stay (HOSPITAL_COMMUNITY)
Admission: EM | Disposition: A | Discharge status: Home / Self Care | Payer: Self-pay | Source: Home / Self Care | Attending: Cardiovascular Disease

## 2019-01-30 DIAGNOSIS — Z955 Presence of coronary angioplasty implant and graft: Secondary | ICD-10-CM | POA: Diagnosis not present

## 2019-01-30 DIAGNOSIS — I129 Hypertensive chronic kidney disease with stage 1 through stage 4 chronic kidney disease, or unspecified chronic kidney disease: Secondary | ICD-10-CM | POA: Diagnosis not present

## 2019-01-30 DIAGNOSIS — Z90710 Acquired absence of both cervix and uterus: Secondary | ICD-10-CM | POA: Diagnosis not present

## 2019-01-30 DIAGNOSIS — M8580 Other specified disorders of bone density and structure, unspecified site: Secondary | ICD-10-CM | POA: Diagnosis present

## 2019-01-30 DIAGNOSIS — I2109 ST elevation (STEMI) myocardial infarction involving other coronary artery of anterior wall: Secondary | ICD-10-CM | POA: Diagnosis not present

## 2019-01-30 DIAGNOSIS — Z853 Personal history of malignant neoplasm of breast: Secondary | ICD-10-CM

## 2019-01-30 DIAGNOSIS — Z85828 Personal history of other malignant neoplasm of skin: Secondary | ICD-10-CM

## 2019-01-30 DIAGNOSIS — Z888 Allergy status to other drugs, medicaments and biological substances status: Secondary | ICD-10-CM

## 2019-01-30 DIAGNOSIS — E785 Hyperlipidemia, unspecified: Secondary | ICD-10-CM | POA: Diagnosis present

## 2019-01-30 DIAGNOSIS — R072 Precordial pain: Secondary | ICD-10-CM | POA: Diagnosis not present

## 2019-01-30 DIAGNOSIS — I1A0 Resistant hypertension: Secondary | ICD-10-CM | POA: Diagnosis present

## 2019-01-30 DIAGNOSIS — I499 Cardiac arrhythmia, unspecified: Secondary | ICD-10-CM | POA: Diagnosis not present

## 2019-01-30 DIAGNOSIS — Z823 Family history of stroke: Secondary | ICD-10-CM | POA: Diagnosis not present

## 2019-01-30 DIAGNOSIS — I361 Nonrheumatic tricuspid (valve) insufficiency: Secondary | ICD-10-CM | POA: Diagnosis not present

## 2019-01-30 DIAGNOSIS — I213 ST elevation (STEMI) myocardial infarction of unspecified site: Secondary | ICD-10-CM | POA: Diagnosis present

## 2019-01-30 DIAGNOSIS — I255 Ischemic cardiomyopathy: Secondary | ICD-10-CM | POA: Diagnosis present

## 2019-01-30 DIAGNOSIS — E782 Mixed hyperlipidemia: Secondary | ICD-10-CM | POA: Diagnosis not present

## 2019-01-30 DIAGNOSIS — E1122 Type 2 diabetes mellitus with diabetic chronic kidney disease: Secondary | ICD-10-CM | POA: Diagnosis not present

## 2019-01-30 DIAGNOSIS — I2510 Atherosclerotic heart disease of native coronary artery without angina pectoris: Secondary | ICD-10-CM | POA: Diagnosis not present

## 2019-01-30 DIAGNOSIS — M109 Gout, unspecified: Secondary | ICD-10-CM | POA: Diagnosis not present

## 2019-01-30 DIAGNOSIS — N182 Chronic kidney disease, stage 2 (mild): Secondary | ICD-10-CM | POA: Diagnosis present

## 2019-01-30 DIAGNOSIS — R9431 Abnormal electrocardiogram [ECG] [EKG]: Secondary | ICD-10-CM | POA: Diagnosis not present

## 2019-01-30 DIAGNOSIS — I340 Nonrheumatic mitral (valve) insufficiency: Secondary | ICD-10-CM | POA: Diagnosis not present

## 2019-01-30 DIAGNOSIS — Z8673 Personal history of transient ischemic attack (TIA), and cerebral infarction without residual deficits: Secondary | ICD-10-CM

## 2019-01-30 DIAGNOSIS — Z8249 Family history of ischemic heart disease and other diseases of the circulatory system: Secondary | ICD-10-CM | POA: Diagnosis not present

## 2019-01-30 DIAGNOSIS — Z79899 Other long term (current) drug therapy: Secondary | ICD-10-CM | POA: Diagnosis not present

## 2019-01-30 DIAGNOSIS — I10 Essential (primary) hypertension: Secondary | ICD-10-CM | POA: Diagnosis not present

## 2019-01-30 DIAGNOSIS — I200 Unstable angina: Secondary | ICD-10-CM

## 2019-01-30 DIAGNOSIS — Z20822 Contact with and (suspected) exposure to covid-19: Secondary | ICD-10-CM | POA: Diagnosis not present

## 2019-01-30 DIAGNOSIS — Z7982 Long term (current) use of aspirin: Secondary | ICD-10-CM

## 2019-01-30 DIAGNOSIS — E119 Type 2 diabetes mellitus without complications: Secondary | ICD-10-CM

## 2019-01-30 DIAGNOSIS — I214 Non-ST elevation (NSTEMI) myocardial infarction: Secondary | ICD-10-CM | POA: Diagnosis not present

## 2019-01-30 DIAGNOSIS — Z9011 Acquired absence of right breast and nipple: Secondary | ICD-10-CM

## 2019-01-30 DIAGNOSIS — Z7984 Long term (current) use of oral hypoglycemic drugs: Secondary | ICD-10-CM

## 2019-01-30 DIAGNOSIS — K2100 Gastro-esophageal reflux disease with esophagitis, without bleeding: Secondary | ICD-10-CM | POA: Diagnosis not present

## 2019-01-30 HISTORY — PX: LEFT HEART CATH AND CORONARY ANGIOGRAPHY: CATH118249

## 2019-01-30 HISTORY — PX: CORONARY/GRAFT ACUTE MI REVASCULARIZATION: CATH118305

## 2019-01-30 LAB — COMPREHENSIVE METABOLIC PANEL
ALT: 26 U/L (ref 0–44)
AST: 26 U/L (ref 15–41)
Albumin: 4.5 g/dL (ref 3.5–5.0)
Alkaline Phosphatase: 91 U/L (ref 38–126)
Anion gap: 13 (ref 5–15)
BUN: 24 mg/dL — ABNORMAL HIGH (ref 8–23)
CO2: 20 mmol/L — ABNORMAL LOW (ref 22–32)
Calcium: 10.1 mg/dL (ref 8.9–10.3)
Chloride: 99 mmol/L (ref 98–111)
Creatinine, Ser: 1.26 mg/dL — ABNORMAL HIGH (ref 0.44–1.00)
GFR calc Af Amer: 46 mL/min — ABNORMAL LOW (ref >60)
GFR calc non Af Amer: 40 mL/min — ABNORMAL LOW (ref >60)
Glucose, Bld: 147 mg/dL — ABNORMAL HIGH (ref 70–99)
Potassium: 4.2 mmol/L (ref 3.5–5.1)
Sodium: 132 mmol/L — ABNORMAL LOW (ref 135–145)
Total Bilirubin: 0.8 mg/dL (ref 0.3–1.2)
Total Protein: 8 g/dL (ref 6.5–8.1)

## 2019-01-30 LAB — CBC WITH DIFFERENTIAL/PLATELET
Abs Immature Granulocytes: 0.14 10*3/uL — ABNORMAL HIGH (ref 0.00–0.07)
Basophils Absolute: 0.1 10*3/uL (ref 0.0–0.1)
Basophils Relative: 1 %
Eosinophils Absolute: 0.3 10*3/uL (ref 0.0–0.5)
Eosinophils Relative: 2 %
HCT: 41.5 % (ref 36.0–46.0)
Hemoglobin: 13.4 g/dL (ref 12.0–15.0)
Immature Granulocytes: 1 %
Lymphocytes Relative: 23 %
Lymphs Abs: 3 10*3/uL (ref 0.7–4.0)
MCH: 27.3 pg (ref 26.0–34.0)
MCHC: 32.3 g/dL (ref 30.0–36.0)
MCV: 84.7 fL (ref 80.0–100.0)
Monocytes Absolute: 1.2 10*3/uL — ABNORMAL HIGH (ref 0.1–1.0)
Monocytes Relative: 9 %
Neutro Abs: 8.4 10*3/uL — ABNORMAL HIGH (ref 1.7–7.7)
Neutrophils Relative %: 64 %
Platelets: 451 10*3/uL — ABNORMAL HIGH (ref 150–400)
RBC: 4.9 MIL/uL (ref 3.87–5.11)
RDW: 14.7 % (ref 11.5–15.5)
WBC: 13.1 10*3/uL — ABNORMAL HIGH (ref 4.0–10.5)
nRBC: 0 % (ref 0.0–0.2)

## 2019-01-30 LAB — TROPONIN I (HIGH SENSITIVITY)
Troponin I (High Sensitivity): 689 ng/L (ref <18)
Troponin I (High Sensitivity): 6609 ng/L (ref <18)
Troponin I (High Sensitivity): 6918 ng/L (ref <18)

## 2019-01-30 LAB — LIPID PANEL
Cholesterol: 331 mg/dL — ABNORMAL HIGH (ref 0–200)
HDL: 68 mg/dL (ref >40)
LDL Cholesterol: 229 mg/dL — ABNORMAL HIGH (ref 0–99)
Total CHOL/HDL Ratio: 4.9 RATIO
Triglycerides: 169 mg/dL — ABNORMAL HIGH (ref <150)
VLDL: 34 mg/dL (ref 0–40)

## 2019-01-30 LAB — PROTIME-INR
INR: 1 (ref 0.8–1.2)
Prothrombin Time: 13 seconds (ref 11.4–15.2)

## 2019-01-30 LAB — APTT: aPTT: 47 seconds — ABNORMAL HIGH (ref 24–36)

## 2019-01-30 LAB — RESPIRATORY PANEL BY RT PCR (FLU A&B, COVID)
Influenza A by PCR: NEGATIVE
Influenza B by PCR: NEGATIVE
SARS Coronavirus 2 by RT PCR: NEGATIVE

## 2019-01-30 LAB — MRSA PCR SCREENING: MRSA by PCR: NEGATIVE

## 2019-01-30 SURGERY — CORONARY/GRAFT ACUTE MI REVASCULARIZATION
Anesthesia: LOCAL

## 2019-01-30 MED ORDER — DILTIAZEM HCL ER COATED BEADS 300 MG PO CP24: 300.0000 mg | ORAL_CAPSULE | Freq: Every day | ORAL | Status: DC

## 2019-01-30 MED ORDER — DILTIAZEM HCL ER COATED BEADS 180 MG PO CP24
300.0000 mg | ORAL_CAPSULE | Freq: Every day | ORAL | Status: DC
  Administered 2019-01-30 – 2019-01-31 (×2): 300 mg via ORAL
  Filled 2019-01-30 (×3): qty 1

## 2019-01-30 MED ORDER — SODIUM CHLORIDE 0.9 % IV SOLN: 250.0000 mL | INTRAVENOUS | Status: DC | PRN

## 2019-01-30 MED ORDER — SODIUM CHLORIDE 0.9 % IV SOLN: INTRAVENOUS | Status: AC

## 2019-01-30 MED ORDER — ASPIRIN EC 81 MG PO TBEC
81.0000 mg | DELAYED_RELEASE_TABLET | Freq: Every day | ORAL | Status: DC
  Administered 2019-01-31 – 2019-02-02 (×3): 81 mg via ORAL
  Filled 2019-01-30 (×3): qty 1

## 2019-01-30 MED ORDER — TICAGRELOR 90 MG PO TABS
90.0000 mg | ORAL_TABLET | Freq: Two times a day (BID) | ORAL | Status: DC
  Administered 2019-01-31 – 2019-02-02 (×5): 90 mg via ORAL
  Filled 2019-01-30 (×5): qty 1

## 2019-01-30 MED ORDER — LIDOCAINE HCL (PF) 1 % IJ SOLN
INTRAMUSCULAR | Status: DC | PRN
  Administered 2019-01-30: 2 mL

## 2019-01-30 MED ORDER — SODIUM CHLORIDE 0.9% FLUSH
3.0000 mL | INTRAVENOUS | Status: DC | PRN
  Administered 2019-02-01: 3 mL via INTRAVENOUS

## 2019-01-30 MED ORDER — HYDRALAZINE HCL 20 MG/ML IJ SOLN
10.0000 mg | INTRAMUSCULAR | Status: AC | PRN
  Administered 2019-01-30: 10 mg via INTRAVENOUS
  Filled 2019-01-30: qty 1

## 2019-01-30 MED ORDER — MIDAZOLAM HCL 2 MG/2ML IJ SOLN
INTRAMUSCULAR | Status: DC | PRN
  Administered 2019-01-30: 1 mg via INTRAVENOUS

## 2019-01-30 MED ORDER — ACETAMINOPHEN 325 MG PO TABS: 650.0000 mg | ORAL_TABLET | ORAL | Status: DC | PRN

## 2019-01-30 MED ORDER — NITROGLYCERIN IN D5W 200-5 MCG/ML-% IV SOLN
INTRAVENOUS | Status: AC
  Filled 2019-01-30: qty 250

## 2019-01-30 MED ORDER — LOSARTAN POTASSIUM 50 MG PO TABS: 50.0000 mg | ORAL_TABLET | Freq: Two times a day (BID) | ORAL | Status: DC

## 2019-01-30 MED ORDER — FENTANYL CITRATE (PF) 100 MCG/2ML IJ SOLN
INTRAMUSCULAR | Status: DC | PRN
  Administered 2019-01-30: 25 ug via INTRAVENOUS

## 2019-01-30 MED ORDER — CHLORTHALIDONE 25 MG PO TABS
25.0000 mg | ORAL_TABLET | Freq: Every day | ORAL | Status: DC
  Administered 2019-01-31 – 2019-02-01 (×2): 25 mg via ORAL
  Filled 2019-01-30 (×2): qty 1

## 2019-01-30 MED ORDER — LINAGLIPTIN 5 MG PO TABS
5.0000 mg | ORAL_TABLET | Freq: Every day | ORAL | Status: DC
  Administered 2019-01-31 – 2019-02-02 (×3): 5 mg via ORAL
  Filled 2019-01-30 (×3): qty 1

## 2019-01-30 MED ORDER — NITROGLYCERIN 0.4 MG SL SUBL
SUBLINGUAL_TABLET | SUBLINGUAL | Status: AC
  Administered 2019-01-30: 18:00:00 0.4 mg via SUBLINGUAL
  Filled 2019-01-30: qty 1

## 2019-01-30 MED ORDER — HEPARIN (PORCINE) IN NACL 1000-0.9 UT/500ML-% IV SOLN
INTRAVENOUS | Status: DC | PRN
  Administered 2019-01-30 (×2): 500 mL

## 2019-01-30 MED ORDER — ASPIRIN 81 MG PO CHEW
243.0000 mg | CHEWABLE_TABLET | Freq: Once | ORAL | Status: AC
  Administered 2019-01-30: 18:00:00 243 mg via ORAL
  Filled 2019-01-30: qty 3

## 2019-01-30 MED ORDER — HEPARIN SODIUM (PORCINE) 5000 UNIT/ML IJ SOLN
60.0000 [IU]/kg | Freq: Once | INTRAMUSCULAR | Status: AC
  Administered 2019-01-30: 18:00:00 3800 [IU] via INTRAVENOUS
  Filled 2019-01-30: qty 1

## 2019-01-30 MED ORDER — IOHEXOL 350 MG/ML SOLN
INTRAVENOUS | Status: DC | PRN
  Administered 2019-01-30: 115 mL

## 2019-01-30 MED ORDER — VERAPAMIL HCL 2.5 MG/ML IV SOLN
INTRAVENOUS | Status: DC | PRN
  Administered 2019-01-30: 10 mL via INTRA_ARTERIAL

## 2019-01-30 MED ORDER — CHLORHEXIDINE GLUCONATE CLOTH 2 % EX PADS: 6.0000 | MEDICATED_PAD | Freq: Every day | CUTANEOUS | Status: DC

## 2019-01-30 MED ORDER — LABETALOL HCL 200 MG PO TABS
400.0000 mg | ORAL_TABLET | Freq: Two times a day (BID) | ORAL | Status: DC
  Administered 2019-01-31 – 2019-02-01 (×3): 400 mg via ORAL
  Filled 2019-01-30 (×5): qty 2

## 2019-01-30 MED ORDER — NITROGLYCERIN 0.4 MG SL SUBL
0.4000 mg | SUBLINGUAL_TABLET | SUBLINGUAL | Status: DC | PRN
  Administered 2019-01-30 (×2): 0.4 mg via SUBLINGUAL
  Filled 2019-01-30: qty 1

## 2019-01-30 MED ORDER — IRBESARTAN 300 MG PO TABS
300.0000 mg | ORAL_TABLET | Freq: Every day | ORAL | Status: DC
  Administered 2019-01-31 – 2019-02-02 (×3): 300 mg via ORAL
  Filled 2019-01-30 (×3): qty 1

## 2019-01-30 MED ORDER — TICAGRELOR 90 MG PO TABS
ORAL_TABLET | ORAL | Status: DC | PRN
  Administered 2019-01-30: 180 mg via ORAL

## 2019-01-30 MED ORDER — SODIUM CHLORIDE 0.9% FLUSH
3.0000 mL | Freq: Two times a day (BID) | INTRAVENOUS | Status: DC
  Administered 2019-01-30 – 2019-02-02 (×4): 3 mL via INTRAVENOUS

## 2019-01-30 MED ORDER — ISOSORBIDE MONONITRATE ER 30 MG PO TB24
30.0000 mg | ORAL_TABLET | Freq: Every evening | ORAL | Status: DC
  Administered 2019-01-30 – 2019-02-01 (×3): 30 mg via ORAL
  Filled 2019-01-30 (×3): qty 1

## 2019-01-30 MED ORDER — SODIUM CHLORIDE 0.9 % IV SOLN
INTRAVENOUS | Status: DC
  Administered 2019-01-30: 18:00:00 10 mL via INTRAVENOUS

## 2019-01-30 MED ORDER — LABETALOL HCL 200 MG PO TABS: 200.0000 mg | ORAL_TABLET | Freq: Two times a day (BID) | ORAL | Status: DC

## 2019-01-30 MED ORDER — SODIUM CHLORIDE 0.9 % IV SOLN
INTRAVENOUS | Status: DC | PRN
  Administered 2019-01-30: 10 mL/h via INTRAVENOUS

## 2019-01-30 MED ORDER — ONDANSETRON HCL 4 MG/2ML IJ SOLN: 4.0000 mg | Freq: Four times a day (QID) | INTRAMUSCULAR | Status: DC | PRN

## 2019-01-30 MED ORDER — HEPARIN SODIUM (PORCINE) 1000 UNIT/ML IJ SOLN
INTRAMUSCULAR | Status: DC | PRN
  Administered 2019-01-30: 4000 [IU] via INTRAVENOUS
  Administered 2019-01-30: 6000 [IU] via INTRAVENOUS

## 2019-01-30 SURGICAL SUPPLY — 18 items
BALLN SAPPHIRE 2.0X12 (BALLOONS) ×2
BALLN ~~LOC~~ EMERGE MR 3.25X20 (BALLOONS) ×2
BALLOON SAPPHIRE 2.0X12 (BALLOONS) ×1 IMPLANT
BALLOON ~~LOC~~ EMERGE MR 3.25X20 (BALLOONS) ×1 IMPLANT
CATH 5FR JL3.5 JR4 ANG PIG MP (CATHETERS) ×2 IMPLANT
CATH VISTA GUIDE 6FR XBLAD3.5 (CATHETERS) ×2 IMPLANT
DEVICE RAD COMP TR BAND LRG (VASCULAR PRODUCTS) ×2 IMPLANT
GLIDESHEATH SLEND SS 6F .021 (SHEATH) ×2 IMPLANT
GUIDEWIRE INQWIRE 1.5J.035X260 (WIRE) ×1 IMPLANT
INQWIRE 1.5J .035X260CM (WIRE) ×2
KIT ENCORE 26 ADVANTAGE (KITS) ×2 IMPLANT
KIT HEART LEFT (KITS) ×2 IMPLANT
PACK CARDIAC CATHETERIZATION (CUSTOM PROCEDURE TRAY) ×2 IMPLANT
STENT SYNERGY XD 3.0X28 (Permanent Stent) ×1 IMPLANT
SYNERGY XD 3.0X28 (Permanent Stent) ×2 IMPLANT
TRANSDUCER W/STOPCOCK (MISCELLANEOUS) ×2 IMPLANT
TUBING CIL FLEX 10 FLL-RA (TUBING) ×2 IMPLANT
WIRE COUGAR XT STRL 190CM (WIRE) ×2 IMPLANT

## 2019-01-30 NOTE — ED Notes (Addendum)
Pt reports awakened at 0100 With midsternal chest pain  Reports 8/10 and has been consistant  She took Asa 162 mg po wihout relief and when pain did not abate this afternoon  Came to ED for eval   She reports she has a niece and nephew and did not want to bother them   Niece :   [phone] has been contacted and is informed that pt is going to [facility]  Reports to [employer], RN, [employer], ED [employer]

## 2019-01-30 NOTE — ED Notes (Signed)
After NTG SL x 3   Pt reports pain down from 8/10 To 1/10

## 2019-01-30 NOTE — ED Notes (Signed)
[facility] has been called for transport and are here to transport    PCRX has been done, labs,

## 2019-01-30 NOTE — H&P (Addendum)
Cardiology History & Physical    Patient ID: Crystal [name] MRN: [id number]; DOB: 06-02-1934   Admission date: 01/30/2019  Primary Care Provider: Glenda Chroman, MD Primary Cardiologist: No primary care provider on file.  Primary Electrophysiologist:  None   Chief Complaint:  Chest pain  Patient Profile:   Crystal [name] is a 85 y.o. female with a history of DM, HTN, left renal artery stenosis, mild CKD, and breast cancer s/p R mastectomy who presents with chest pain.  History of Present Illness:   Patient had onset of chest pain at approximately 1am.  She took some aspirin, which initially helped some but her pain continued to wax and wane.  She eventually went to the ED at [facility] at around 5PM.  ECG there was concerning for STEMI (STE in V4-V6, II/aVF, and I/aVL).  Patient was transferred to [facility] for urgent coronary angiography.  This demonstrated a 99% mid LAD lesion which was treated with a 3x28 DES via R radial access..  Otherwise mild disease in LCx and moderate 40-60% in RCA.    She denies any cardiac history.  No history of tobacco use.  Does have a family history of heart disease.  She denies fevers, chills, cough, dyspnea, edema, weight gain, or any other new symptoms.  Labs in ED notable for Cr 1.26, Tn 689, WBC 13.  Otherwise unremarkable.  Rapid COVID negative.  CXR normal.   Heart Pathway Score:      Past Medical History:  Diagnosis Date  . Bell's palsy   . Breast cancer ([place]) 1998   Right mastectomy  . Essential hypertension   . GERD (gastroesophageal reflux disease)   . Gout   . History of skin cancer    Squamous cell, left shoulder  . History of stroke   . Mixed hyperlipidemia   . Osteopenia   . Reflux esophagitis   . Thyroid nodule   . Type 2 diabetes mellitus ([place])     Past Surgical History:  Procedure Laterality Date  . ABDOMINAL HYSTERECTOMY    . CATARACT EXTRACTION  2016  . Right mastectomy  1998   [facility]     Medications Prior to  Admission: Prior to Admission medications   Medication Sig Start Date End Date Taking? Authorizing Provider  aspirin EC 81 MG tablet Take 81 mg by mouth daily.    [provider]  Calcium Carbonate-Vitamin D (CALCIUM 500 + D PO) Take by mouth.    [provider]  chlorthalidone (HYGROTON) 25 MG tablet Take 25 mg by mouth daily.    [provider]  diltiazem (CARDIZEM CD) 300 MG 24 hr capsule Take 300 mg by mouth as needed.    [provider]  isosorbide mononitrate (IMDUR) 30 MG 24 hr tablet TAKE 1 TABLET IN THE EVENING. 12/06/18   Satira Sark, MD  labetalol (NORMODYNE) 200 MG tablet Take 200 mg by mouth 2 (two) times daily.     [provider]  losartan (COZAAR) 50 MG tablet Take 50 mg by mouth 2 (two) times daily.     [provider]  meclizine (ANTIVERT) 12.5 MG tablet Take 12.5 mg by mouth 3 (three) times daily as needed for dizziness.    [provider]  Multiple Vitamin (MULTIVITAMIN) capsule Take 1 capsule by mouth daily.    [provider]  multivitamin-lutein (OCUVITE-LUTEIN) CAPS capsule Take 1 capsule by mouth daily.    [provider]  Omega-3 Fatty Acids (OMEGA 3 500 PO)  Take by mouth.    [provider]  saxagliptin HCl (ONGLYZA) 5 MG TABS tablet Take 5 mg by mouth daily.    [provider]     Allergies:    Allergies  Allergen Reactions  . Amlodipine     EDEMA  . Azithromycin     FACIAL BURNING   . Bactrim [Sulfamethoxazole-Trimethoprim]     NAUSEA  . Clonidine Derivatives     FATIGUE  . Coreg [Carvedilol]     GLAUCOMA   . Evista [Raloxifene Hcl]     GERD  . Fosamax [Alendronate Sodium]     INCREASED GERD   . Glipizide     PALPITATIONS  . Lipitor [Atorvastatin Calcium]     ACHING  . Losartan     FATIGUE   . Pravastatin     ACHING  . Sulfa Antibiotics     NAUSEA    Social History:   Social History   Socioeconomic History  . Marital status: Divorced     Spouse name: Not on file  . Number of children: Not on file  . Years of education: Not on file  . Highest education level: Not on file  Occupational History  . Not on file  Tobacco Use  . Smoking status: Never Smoker  . Smokeless tobacco: Never Used  Substance and Sexual Activity  . Alcohol use: Never  . Drug use: Never  . Sexual activity: Not on file  Other Topics Concern  . Not on file  Social History Narrative  . Not on file   Social Determinants of Health   Financial Resource Strain:   . Difficulty of Paying Living Expenses: Not on file  Food Insecurity:   . Worried About Charity fundraiser in the Last Year: Not on file  . Ran Out of Food in the Last Year: Not on file  Transportation Needs:   . Lack of Transportation (Medical): Not on file  . Lack of Transportation (Non-Medical): Not on file  Physical Activity:   . Days of Exercise per Week: Not on file  . Minutes of Exercise per Session: Not on file  Stress:   . Feeling of Stress : Not on file  Social Connections:   . Frequency of Communication with Friends and Family: Not on file  . Frequency of Social Gatherings with Friends and Family: Not on file  . Attends Religious Services: Not on file  . Active Member of Clubs or Organizations: Not on file  . Attends Archivist Meetings: Not on file  . Marital Status: Not on file  Intimate Partner Violence:   . Fear of Current or Ex-Partner: Not on file  . Emotionally Abused: Not on file  . Physically Abused: Not on file  . Sexually Abused: Not on file     Family History:   The patient's family history includes Aneurysm in her sister; Cancer in her sister; Heart attack in her father, maternal uncle, maternal uncle, maternal uncle, paternal aunt, paternal aunt, paternal grandfather, and sister; Heart disease in her maternal uncle, maternal uncle, maternal uncle, paternal aunt, paternal aunt, paternal grandfather, and sister; Stroke in her mother.    ROS:   Please see the history of present illness.  All other ROS reviewed and negative.     Physical Exam/Data:   Vitals:   01/30/19 1734 01/30/19 1743 01/30/19 1745 01/30/19 1747  BP: (!) 184/74 (!) 167/76  (!) 167/76  Pulse: 86 84 81 (!) 103  Resp:  17 15   Temp:      TempSrc:      SpO2: 96% 97% 97% 93%  Weight:      Height:       No intake or output data in the 24 hours ending 01/30/19 1839 Last 3 Weights 01/30/2019 01/30/2019 09/16/2017  Weight (lbs) 140 lb 140 lb 143 lb 9.6 oz  Weight (kg) 63.504 kg 63.504 kg 65.137 kg     Body mass index is 23.3 kg/m.  Wt Readings from Last 3 Encounters:  01/30/19 63.5 kg  09/16/17 65.1 kg  06/03/17 64 kg    Physical Exam: General: Well developed, well nourished, in no acute distress. Head: Normocephalic, atraumatic, sclera non-icteric, no xanthomas, nares are without discharge.  Neck: Negative for carotid bruits. JVD not elevated. Lungs: Clear bilaterally to auscultation without wheezes, rales, or rhonchi. Breathing is unlabored. Heart: RRR with S1 S2. No murmurs, rubs, or gallops appreciated. Abdomen: Soft, non-tender, non-distended with normoactive bowel sounds. No hepatomegaly. No rebound/guarding. No obvious abdominal masses. Msk:  Strength and tone appear normal for age. Extremities: No clubbing or cyanosis. No edema.  Distal pedal pulses are 2+ and equal bilaterally. Neuro: Alert and oriented X 3. No focal deficit. No facial asymmetry. Moves all extremities spontaneously. Psych:  Responds to questions appropriately with a normal affect.    EKG:  The ECG that was done  was personally reviewed.  See HPI.  Relevant CV Studies: Echo 04/2017 normal  Laboratory Data:  High Sensitivity Troponin:   Recent Labs  Lab 01/30/19 1727  TROPONINIHS 689*      Cardiac EnzymesNo results for input(s): TROPONINI in the last 168 hours. No results for input(s): TROPIPOC in the last 168 hours.  Chemistry Recent Labs  Lab 01/30/19 1727  NA 132*   K 4.2  CL 99  CO2 20*  GLUCOSE 147*  BUN 24*  CREATININE 1.26*  CALCIUM 10.1  GFRNONAA 40*  GFRAA 46*  ANIONGAP 13    Recent Labs  Lab 01/30/19 1727  PROT 8.0  ALBUMIN 4.5  AST 26  ALT 26  ALKPHOS 91  BILITOT 0.8   Hematology Recent Labs  Lab 01/30/19 1727  WBC 13.1*  RBC 4.90  HGB 13.4  HCT 41.5  MCV 84.7  MCH 27.3  MCHC 32.3  RDW 14.7  PLT 451*   BNPNo results for input(s): BNP, PROBNP in the last 168 hours.  DDimer No results for input(s): DDIMER in the last 168 hours.   Radiology/Studies:  DG Chest Port 1 View  Result Date: 01/30/2019 CLINICAL DATA:  Chest pain EXAM: PORTABLE CHEST 1 VIEW COMPARISON:  None. FINDINGS: The heart size and mediastinal contours are within normal limits. Both lungs are clear. The visualized skeletal structures are unremarkable. IMPRESSION: No active disease. Electronically Signed   By: Kerby Moors M.D.   On: 01/30/2019 17:57    Assessment and Plan   1. STEMI Coronary angiography with 99% mid LAD lesion, successfully treated with PCI.  Pt currently stable with improvement in chest pain.  Plan DAPTx1 year and other routine post-STEMI care. -- start ticagrelor 90 mg BID -- aspirin 81 mg daily -- continue home labetolol and losartan -- echocardiogram tomorrow -- muscle aches with multiple statins - will need to investigate alternatives (LDL 229)  2. Resistant HTN BP currently 176/86.  Will continue home regimen and adjust as needed of BP remains high.  Is being worked up as an outpatient for L renal artery stenosis. -- continue chlorthalidone 25,  diltiazem 300, imdur 30, labetolol 200, losartan 50  3. Diabetes mellitus Will hold home saxagliptin and monitor BS.   Severity of Illness: The appropriate patient status for this patient is INPATIENT. Inpatient status is judged to be reasonable and necessary in order to provide the required intensity of service to ensure the patient's safety. The patient's presenting symptoms,  physical exam findings, and initial radiographic and laboratory data in the context of their chronic comorbidities is felt to place them at high risk for further clinical deterioration. Furthermore, it is not anticipated that the patient will be medically stable for discharge from the hospital within 2 midnights of admission. The following factors support the patient status of inpatient.   " The patient's presenting symptoms include chest pain. " The worrisome physical exam findings include none. " The initial radiographic and laboratory data are worrisome because of STEMI. " The chronic co-morbidities include HTN, DM.   * I certify that at the point of admission it is my clinical judgment that the patient will require inpatient hospital care spanning beyond 2 midnights from the point of admission due to high intensity of service, high risk for further deterioration and high frequency of surveillance required.*    For questions or updates, please contact [place] Please consult [email or web address] for contact info under      Signed, BARNETT, ADAM S, MD 01/30/2019, 6:39 PM   I have personally seen and examined this patient with Dr. Charissa Bash. I agree with the assessment and plan as outlined above. She is admitted with ongoing chest pain, EKG changes with subtle ST elevation in the anterior and inferior leads. Her EKG changes do not meet criteria for STEMI. Emergent cardiac cath given ongoing pain and EKG changes. Severe stenosis mid LAD treated with a DES x 1. Will plan DAPT with ASA and Brilinta. Echo tomorrow. Statin intolerant. Consider PCSK9 inh as outpatient. Continue beta blocker.   Lauree Chandler 01/30/2019 8:57 PM

## 2019-01-30 NOTE — Progress Notes (Signed)
CRITICAL VALUE ALERT  Critical Value:  Troponin 6609  Date & Time Notied:  01/30/2019, 2210  Provider Notified: Cardiology fellow text paged.  Orders Received/Actions taken: No orders received at this time.  Patient has no complaints of chest pain or tightness.

## 2019-01-30 NOTE — ED Triage Notes (Signed)
Patient complains of chest pain that began today at 0100. Patient took 2 tylenol and 1 baby aspirin at 0200. Pain starts in the medial chest and radiates down her left arm.

## 2019-01-30 NOTE — ED Notes (Signed)
Code STEMI paged out to [facility].

## 2019-01-30 NOTE — ED Notes (Signed)
[facility] called to transport pt to [facility].

## 2019-01-30 NOTE — ED Provider Notes (Signed)
[facility] Provider Note   CSN: [id number] Arrival date & time: 01/30/19  1625     History Chief Complaint  Patient presents with  . Chest Pain    Crystal [name] is a 85 y.o. female.  HPI    Patient presents with chest pain. Patient notes that she was in her usual state of health until about 4 and half hours prior to ED arrival.  She awoke from sleeping with sternal chest pressure.  Since onset it has been persistent, with only transient relief after receiving aspirin at home.  No no dyspnea, no other pain, no fever, no cough. Patient has a history of prior stress test, cardiac monitor, but no catheterization.  She does have a history of cardiac disease in her family, but none personally. She notes a history of hypertension, diabetes personally.  Past Medical History:  Diagnosis Date  . Bell's palsy   . Breast cancer ([place]) 1998   Right mastectomy  . Essential hypertension   . GERD (gastroesophageal reflux disease)   . Gout   . History of skin cancer    Squamous cell, left shoulder  . History of stroke   . Mixed hyperlipidemia   . Osteopenia   . Reflux esophagitis   . Thyroid nodule   . Type 2 diabetes mellitus ([facility])     There are no problems to display for this patient.   Past Surgical History:  Procedure Laterality Date  . ABDOMINAL HYSTERECTOMY    . CATARACT EXTRACTION  2016  . Right mastectomy  1998   [facility]     OB History   No obstetric history on file.     Family History  Problem Relation Age of Onset  . Stroke Mother   . Heart attack Father   . Aneurysm Sister   . Heart attack Maternal Uncle   . Heart disease Maternal Uncle   . Heart attack Paternal Aunt   . Heart disease Paternal Aunt   . Heart attack Paternal Grandfather   . Heart disease Paternal Grandfather   . Heart attack Paternal Aunt   . Heart disease Paternal Aunt   . Heart attack Maternal Uncle   . Heart disease Maternal Uncle   . Heart attack Maternal  Uncle   . Heart disease Maternal Uncle   . Heart attack Sister   . Heart disease Sister   . Cancer Sister     Social History   Tobacco Use  . Smoking status: Never Smoker  . Smokeless tobacco: Never Used  Substance Use Topics  . Alcohol use: Never  . Drug use: Never    Home Medications Prior to Admission medications   Medication Sig Start Date End Date Taking? Authorizing Provider  aspirin EC 81 MG tablet Take 81 mg by mouth daily.    [provider]  Calcium Carbonate-Vitamin D (CALCIUM 500 + D PO) Take by mouth.    [provider]  chlorthalidone (HYGROTON) 25 MG tablet Take 25 mg by mouth daily.    [provider]  diltiazem (CARDIZEM CD) 300 MG 24 hr capsule Take 300 mg by mouth as needed.    [provider]  isosorbide mononitrate (IMDUR) 30 MG 24 hr tablet TAKE 1 TABLET IN THE EVENING. 12/06/18   Satira Sark, MD  labetalol (NORMODYNE) 200 MG tablet Take 200 mg by mouth 2 (two) times daily.     [provider]  losartan (COZAAR) 50 MG tablet Take 50 mg by mouth  2 (two) times daily.     [provider]  meclizine (ANTIVERT) 12.5 MG tablet Take 12.5 mg by mouth 3 (three) times daily as needed for dizziness.    [provider]  Multiple Vitamin (MULTIVITAMIN) capsule Take 1 capsule by mouth daily.    [provider]  multivitamin-lutein (OCUVITE-LUTEIN) CAPS capsule Take 1 capsule by mouth daily.    [provider]  Omega-3 Fatty Acids (OMEGA 3 500 PO) Take by mouth.    [provider]  saxagliptin HCl (ONGLYZA) 5 MG TABS tablet Take 5 mg by mouth daily.    [provider]    Allergies    Amlodipine, Azithromycin, Bactrim [sulfamethoxazole-trimethoprim], Clonidine derivatives, Coreg [carvedilol], Evista [raloxifene hcl], Fosamax [alendronate sodium], Glipizide, Lipitor [atorvastatin calcium], Losartan, Pravastatin, and Sulfa antibiotics  Review of Systems   Review of  Systems  Constitutional:       Per HPI, otherwise negative  HENT:       Per HPI, otherwise negative  Respiratory:       Per HPI, otherwise negative  Cardiovascular:       Per HPI, otherwise negative  Gastrointestinal: Negative for vomiting.  Endocrine:       Negative aside from HPI  Genitourinary:       Neg aside from HPI   Musculoskeletal:       Per HPI, otherwise negative  Skin: Negative.   Neurological: Negative for syncope.    Physical Exam Updated Vital Signs BP (!) 184/74 (BP Location: Left Arm)   Pulse 86   Temp 97.8 F (36.6 C) (Oral)   Resp 16   Ht 5\' 5"  (1.651 m)   Wt 63.5 kg   SpO2 96%   BMI 23.30 kg/m   Physical Exam Vitals and nursing note reviewed.  Constitutional:      General: She is not in acute distress.    Appearance: She is well-developed.  HENT:     Head: Normocephalic and atraumatic.  Eyes:     Conjunctiva/sclera: Conjunctivae normal.  Cardiovascular:     Rate and Rhythm: Normal rate and regular rhythm.  Pulmonary:     Effort: Pulmonary effort is normal. No respiratory distress.     Breath sounds: Normal breath sounds. No stridor.  Abdominal:     General: There is no distension.  Skin:    General: Skin is warm and dry.  Neurological:     Mental Status: She is alert and oriented to person, place, and time.     Cranial Nerves: No cranial nerve deficit.     ED Results / Procedures / Treatments   Labs (all labs ordered are listed, but only abnormal results are displayed) Labs Reviewed  CBC WITH DIFFERENTIAL/PLATELET - Abnormal; Notable for the following components:      Result Value   WBC 13.1 (*)    Platelets 451 (*)    Neutro Abs 8.4 (*)    Monocytes Absolute 1.2 (*)    Abs Immature Granulocytes 0.14 (*)    All other components within normal limits  APTT - Abnormal; Notable for the following components:   aPTT 47 (*)    All other components within normal limits  COMPREHENSIVE METABOLIC PANEL - Abnormal; Notable for the  following components:   Sodium 132 (*)    CO2 20 (*)    Glucose, Bld 147 (*)    BUN 24 (*)    Creatinine, Ser 1.26 (*)    GFR calc non Af Amer 40 (*)  GFR calc Af Amer 46 (*)    All other components within normal limits  LIPID PANEL - Abnormal; Notable for the following components:   Cholesterol 331 (*)    Triglycerides 169 (*)    LDL Cholesterol 229 (*)    All other components within normal limits  TROPONIN I (HIGH SENSITIVITY) - Abnormal; Notable for the following components:   Troponin I (High Sensitivity) 689 (*)    All other components within normal limits  RESPIRATORY PANEL BY RT PCR (FLU A&B, COVID)  PROTIME-INR  TROPONIN I (HIGH SENSITIVITY)    EKG EKG Interpretation  Date/Time:  Sunday January 30 2019 17:24:43 EST Ventricular Rate:  89 PR Interval:  144 QRS Duration: 85 QT Interval:  371 QTC Calculation: 452 R Axis:   68 Text Interpretation: Sinus rhythm Probable left atrial enlargement diffuse st-t wave changes similar to those  of a few minutes ago Baseline wander artefact In comparison to ECG 06/03/17 - ST changes are new Abnormal ECG Confirmed by Carmin Muskrat [phone]) on 01/30/2019 5:37:50 PM   Radiology DG Chest Port 1 View  Result Date: 01/30/2019 CLINICAL DATA:  Chest pain EXAM: PORTABLE CHEST 1 VIEW COMPARISON:  None. FINDINGS: The heart size and mediastinal contours are within normal limits. Both lungs are clear. The visualized skeletal structures are unremarkable. IMPRESSION: No active disease. Electronically Signed   By: Kerby Moors M.D.   On: 01/30/2019 17:57    Procedures Procedures (including critical care time)  CRITICAL CARE Performed by: Carmin Muskrat Total critical care time: 35 minutes Critical care time was exclusive of separately billable procedures and treating other patients. Critical care was necessary to treat or prevent imminent or life-threatening deterioration. Critical care was time spent personally by me on the following  activities: development of treatment plan with patient and/or surrogate as well as nursing, discussions with consultants, evaluation of patient's response to treatment, examination of patient, obtaining history from patient or surrogate, ordering and performing treatments and interventions, ordering and review of laboratory studies, ordering and review of radiographic studies, pulse oximetry and re-evaluation of patient's condition.   Medications Ordered in ED Medications  nitroGLYCERIN 0.2 mg/mL in dextrose 5 % infusion (has no administration in time range)  0.9 %  sodium chloride infusion (has no administration in time range)  aspirin chewable tablet 243 mg (has no administration in time range)  heparin injection 3,800 Units (has no administration in time range)  nitroGLYCERIN (NITROSTAT) SL tablet 0.4 mg (has no administration in time range)  nitroGLYCERIN (NITROSTAT) 0.4 MG SL tablet (0.4 mg  Given 01/30/19 1737)    ED Course  I have reviewed the triage vital signs and the nursing notes.  Pertinent labs & imaging results that were available during my care of the patient were reviewed by me and considered in my medical decision making (see chart for details).    MDM Rules/Calculators/A&P                     Initially discussed the patient's case with triage staff, after they had an EKG performed on arrival. That EKG had diffuse ST changes consistent with repull, the patient was brought emergently to exam room. There, repeat EKG was more concerning with diffuse ST elevations, though with no reciprocal depressions. Comparison to EKG from May, 2019, EKG changes are new. With persistent chest pain, new EKG changes, patient was designated as a code STEMI, received heparin, nitroglycerin, remaining aspirin.   With consideration of acute coronary syndrome  I discussed the patient's case with our cardiology colleagues and she was prepared for expedient transfer to our affiliated center.   6:15  PM Initial point-of-care troponin greater than 600.  Patient in route to [facility] for planned catheterization. Final Clinical Impression(s) / ED Diagnoses Final diagnoses:  Unstable angina [facility])     Carmin Muskrat, MD 01/30/19 1816

## 2019-01-31 ENCOUNTER — Inpatient Hospital Stay (HOSPITAL_COMMUNITY): Payer: Medicare Other

## 2019-01-31 DIAGNOSIS — I340 Nonrheumatic mitral (valve) insufficiency: Secondary | ICD-10-CM

## 2019-01-31 DIAGNOSIS — I361 Nonrheumatic tricuspid (valve) insufficiency: Secondary | ICD-10-CM

## 2019-01-31 LAB — BASIC METABOLIC PANEL
Anion gap: 11 (ref 5–15)
BUN: 27 mg/dL — ABNORMAL HIGH (ref 8–23)
CO2: 20 mmol/L — ABNORMAL LOW (ref 22–32)
Calcium: 9.2 mg/dL (ref 8.9–10.3)
Chloride: 103 mmol/L (ref 98–111)
Creatinine, Ser: 1.29 mg/dL — ABNORMAL HIGH (ref 0.44–1.00)
GFR calc Af Amer: 45 mL/min — ABNORMAL LOW (ref >60)
GFR calc non Af Amer: 39 mL/min — ABNORMAL LOW (ref >60)
Glucose, Bld: 141 mg/dL — ABNORMAL HIGH (ref 70–99)
Potassium: 4.1 mmol/L (ref 3.5–5.1)
Sodium: 134 mmol/L — ABNORMAL LOW (ref 135–145)

## 2019-01-31 LAB — ECHOCARDIOGRAM COMPLETE
Height: 65 in
Weight: 2239.87 oz

## 2019-01-31 LAB — CBC
HCT: 34.7 % — ABNORMAL LOW (ref 36.0–46.0)
Hemoglobin: 11.7 g/dL — ABNORMAL LOW (ref 12.0–15.0)
MCH: 27.3 pg (ref 26.0–34.0)
MCHC: 33.7 g/dL (ref 30.0–36.0)
MCV: 80.9 fL (ref 80.0–100.0)
Platelets: 381 10*3/uL (ref 150–400)
RBC: 4.29 MIL/uL (ref 3.87–5.11)
RDW: 14.8 % (ref 11.5–15.5)
WBC: 11.8 10*3/uL — ABNORMAL HIGH (ref 4.0–10.5)
nRBC: 0 % (ref 0.0–0.2)

## 2019-01-31 LAB — GLUCOSE, CAPILLARY: Glucose-Capillary: 156 mg/dL — ABNORMAL HIGH (ref 70–99)

## 2019-01-31 LAB — POCT ACTIVATED CLOTTING TIME: Activated Clotting Time: 395 seconds

## 2019-01-31 MED FILL — Nitroglycerin IV Soln 100 MCG/ML in D5W: INTRA_ARTERIAL | Qty: 10 | Status: CN

## 2019-01-31 NOTE — Progress Notes (Signed)
CARDIAC REHAB PHASE I   PRE:  Rate/Rhythm: 72 SR  BP:  Supine:   Sitting: 101/62  Standing:    SaO2: 100%RA  MODE:  Ambulation: 190 ft   POST:  Rate/Rhythm: 92 SR  BP:  Supine:   Sitting: 105/69  Standing:    SaO2: 99%RA 0601-5615 Pt walked 190 ft with asst x 1 with fairly steady gait. Tolerated without CP. Tired easily. Encouraged pt to walk with staff later. To recliner with call bell after taking pt to bathroom. MI education completed except for ex ed. Discussed MI restrictions, importance of brilinta with stent, NTG use, heart healthy and low carb food choices, and CRP 2. Referred to [facility] program. Not proficient with APPs so not interested in Virtual program.   Graylon Good, RN BSN  01/31/2019 9:11 AM

## 2019-01-31 NOTE — Progress Notes (Signed)
  Echocardiogram 2D Echocardiogram has been performed.  Crystal [name] 01/31/2019, 11:33 AM

## 2019-01-31 NOTE — Progress Notes (Signed)
Progress Note  Patient Name: Crystal [name] Date of Encounter: 01/31/2019  Primary Cardiologist: No primary care provider on file.   Subjective   Feels tired this morning after walking with cardiac rehab.  No chest pain, shortness of breath, or other specific complaints.  Inpatient Medications    Scheduled Meds: . aspirin EC  81 mg Oral Daily  . Chlorhexidine Gluconate Cloth  6 each Topical Daily  . chlorthalidone  25 mg Oral Daily  . diltiazem  300 mg Oral QHS  . irbesartan  300 mg Oral Daily  . isosorbide mononitrate  30 mg Oral QPM  . labetalol  400 mg Oral BID  . linagliptin  5 mg Oral Daily  . sodium chloride flush  3 mL Intravenous Q12H  . ticagrelor  90 mg Oral BID   Continuous Infusions: . sodium chloride 10 mL (01/30/19 1810)  . sodium chloride     PRN Meds: sodium chloride, acetaminophen, nitroGLYCERIN, ondansetron (ZOFRAN) IV, sodium chloride flush   Vital Signs    Vitals:   01/31/19 0600 01/31/19 0619 01/31/19 0700 01/31/19 0818  BP: (!) 140/55  (!) 147/60   Pulse: 80  78   Resp: 20  19   Temp:    98.4 F (36.9 C)  TempSrc:    Oral  SpO2: 98%  98%   Weight:  63.5 kg    Height:        Intake/Output Summary (Last 24 hours) at 01/31/2019 0839 Last data filed at 01/31/2019 0600 Gross per 24 hour  Intake 1077.05 ml  Output --  Net 1077.05 ml   Last 3 Weights 01/31/2019 01/30/2019 01/30/2019  Weight (lbs) 139 lb 15.9 oz 140 lb 140 lb  Weight (kg) 63.5 kg 63.504 kg 63.504 kg      Telemetry    Normal sinus rhythm- Personally Reviewed  ECG    Normal sinus rhythm with mild anterolateral ST elevation, improved from prior - Personally Reviewed  Physical Exam  Alert, oriented, elderly woman GEN: No acute distress.   Neck: No JVD Cardiac: RRR, no murmurs, rubs, or gallops.  Respiratory: Clear to auscultation bilaterally. GI: Soft, nontender, non-distended  MS: No edema; No deformity. Neuro:  Nonfocal  Psych: Normal affect   Labs    High  Sensitivity Troponin:   Recent Labs  Lab 01/30/19 1727 01/30/19 2047 01/30/19 2300  TROPONINIHS 689* 6,609* 6,918*      Chemistry Recent Labs  Lab 01/30/19 1727 01/31/19 0208  NA 132* 134*  K 4.2 4.1  CL 99 103  CO2 20* 20*  GLUCOSE 147* 141*  BUN 24* 27*  CREATININE 1.26* 1.29*  CALCIUM 10.1 9.2  PROT 8.0  --   ALBUMIN 4.5  --   AST 26  --   ALT 26  --   ALKPHOS 91  --   BILITOT 0.8  --   GFRNONAA 40* 39*  GFRAA 46* 45*  ANIONGAP 13 11     Hematology Recent Labs  Lab 01/30/19 1727 01/31/19 0208  WBC 13.1* 11.8*  RBC 4.90 4.29  HGB 13.4 11.7*  HCT 41.5 34.7*  MCV 84.7 80.9  MCH 27.3 27.3  MCHC 32.3 33.7  RDW 14.7 14.8  PLT 451* 381    BNPNo results for input(s): BNP, PROBNP in the last 168 hours.   DDimer No results for input(s): DDIMER in the last 168 hours.   Radiology    CARDIAC CATHETERIZATION  Result Date: 01/30/2019  Prox RCA lesion is 60% stenosed.  Mid  RCA lesion is 40% stenosed.  Prox Cx to Mid Cx lesion is 20% stenosed.  Mid LAD lesion is 99% stenosed.  A drug-eluting stent was successfully placed using a SYNERGY XD 3.0X28.  Post intervention, there is a 0% residual stenosis.  1. Severe stenosis mid LAD 2. Successful PTCA/DES x 1 mid LAD 3. Mild non-obstructive disease in the Circumflex 4. Moderate non-obstructive disease in the mid RCA Recommendations: Will admit to the ICU. DAPT with ASA and Brilinta for one year. Continue home beta blocker. Echo in am. She is statin intolerant. Fast track discharge, possibly home tomorrow if stable.   DG Chest Port 1 View  Result Date: 01/30/2019 CLINICAL DATA:  Chest pain EXAM: PORTABLE CHEST 1 VIEW COMPARISON:  None. FINDINGS: The heart size and mediastinal contours are within normal limits. Both lungs are clear. The visualized skeletal structures are unremarkable. IMPRESSION: No active disease. Electronically Signed   By: Kerby Moors M.D.   On: 01/30/2019 17:57    Cardiac Studies   Cardiac  catheterization 01/30/2019: Conclusion    Prox RCA lesion is 60% stenosed.  Mid RCA lesion is 40% stenosed.  Prox Cx to Mid Cx lesion is 20% stenosed.  Mid LAD lesion is 99% stenosed.  A drug-eluting stent was successfully placed using a SYNERGY XD 3.0X28.  Post intervention, there is a 0% residual stenosis.   1. Severe stenosis mid LAD 2. Successful PTCA/DES x 1 mid LAD 3. Mild non-obstructive disease in the Circumflex 4. Moderate non-obstructive disease in the mid RCA  Recommendations: Will admit to the ICU. DAPT with ASA and Brilinta for one year. Continue home beta blocker. Echo in am. She is statin intolerant. Fast track discharge, possibly home tomorrow if stable.      Patient Profile     85 y.o. female presents with anterolateral MI, treated with PCI of the LAD using a drug-eluting stent.    Assessment & Plan    1.  Anterolateral MI: Now on aspirin and ticagrelor.  Unable to tolerate statin drugs.  Need to evaluate PCSK9 inhibition.  Await 2D echocardiogram. 2.  Resistant hypertension: Has been treated with a combination of chlorthalidone, diltiazem, isosorbide, labetalol, and losartan. 3.  Type 2 diabetes: Treat with sliding scale insulin. 4.  Mixed hyperlipidemia: Markedly elevated LDL.  Will make early referral to lipid clinic for PCSK9 inhibitor.  Disposition: We will transfer to a telemetry bed today and anticipate discharge tomorrow.  For questions or updates, please contact [place] Please consult [email or web address] for contact info under     Signed, Sherren Mocha, MD  01/31/2019, 8:39 AM

## 2019-01-31 NOTE — TOC Benefit Eligibility Note (Signed)
Transition of Care [facility]) Benefit Eligibility Note    Patient Details  Name: Crystal [name] MRN: [id number] Date of Birth: 04-May-1934   Medication/Dose: Brilinta/Ticagrelor 90mg  twice daily  Covered?: Yes  Tier: Other(unavailable)  Prescription Coverage Preferred Pharmacy: Any retail  Spoke with Person/Company/Phone Number:: Jinny Blossom / [facility]/ [phone]  Co-Pay: 197.06 30 day supply Retail /125.00 15 day supply mail order  Prior Approval: No  Deductible: Unmet  Additional Notes: Alternative  / Clopidogrel 75 mg 4.42 30 day supply 10.00 for a 90 day supply  Mail Order    Orbie Pyo Phone Number: 01/31/2019, 10:40 AM

## 2019-01-31 NOTE — Progress Notes (Signed)
Received report from [place] at [phone]. Patient arrive to unit at approximately 1400. Glennon Mac RN indicated right radial site level 1 with bruising extending into forearm  Assessed right radial cath site upon admission, bruising extends minimally into palm and up forearm. Level 1 with raised area. No pain at site and 2+ pulse.

## 2019-01-31 NOTE — Care Management (Signed)
Brilinta benefits check sent and pending.  Caiden Monsivais MSN, RN, NCM-BC, ACM-RN [phone] 

## 2019-01-31 NOTE — Plan of Care (Signed)
  Problem: Activity: Goal: Ability to return to baseline activity level will improve Outcome: Progressing   Problem: Cardiovascular: Goal: Ability to achieve and maintain adequate cardiovascular perfusion will improve Outcome: Progressing Goal: Vascular access site(s) Level 0-1 will be maintained Outcome: Progressing   Problem: Clinical Measurements: Goal: Ability to maintain clinical measurements within normal limits will improve Outcome: Progressing Goal: Will remain free from infection Outcome: Progressing Goal: Diagnostic test results will improve Outcome: Progressing Goal: Respiratory complications will improve Outcome: Progressing Goal: Cardiovascular complication will be avoided Outcome: Progressing   Problem: Nutrition: Goal: Adequate nutrition will be maintained Outcome: Progressing   Problem: Elimination: Goal: Will not experience complications related to bowel motility Outcome: Progressing Goal: Will not experience complications related to urinary retention Outcome: Progressing   Problem: Pain Managment: Goal: General experience of comfort will improve Outcome: Progressing   Problem: Safety: Goal: Ability to remain free from injury will improve Outcome: Progressing

## 2019-02-01 ENCOUNTER — Encounter: Payer: Self-pay | Admitting: General Practice

## 2019-02-01 MED ORDER — CARVEDILOL 25 MG PO TABS
25.0000 mg | ORAL_TABLET | Freq: Two times a day (BID) | ORAL | Status: DC
  Administered 2019-02-01 – 2019-02-02 (×2): 25 mg via ORAL
  Filled 2019-02-01 (×2): qty 1

## 2019-02-01 MED ORDER — SPIRONOLACTONE 25 MG PO TABS
25.0000 mg | ORAL_TABLET | Freq: Every day | ORAL | Status: DC
  Administered 2019-02-01 – 2019-02-02 (×2): 25 mg via ORAL
  Filled 2019-02-01 (×2): qty 1

## 2019-02-01 MED FILL — Nitroglycerin IV Soln 100 MCG/ML in D5W: INTRA_ARTERIAL | Qty: 10 | Status: AC

## 2019-02-01 NOTE — Progress Notes (Addendum)
Progress Note  Patient Name: Crystal [name] Date of Encounter: 02/01/2019  Primary Cardiologist: No primary care provider on file.   Subjective   Feeling well this morning.   Inpatient Medications    Scheduled Meds: . aspirin EC  81 mg Oral Daily  . carvedilol  25 mg Oral BID WC  . irbesartan  300 mg Oral Daily  . isosorbide mononitrate  30 mg Oral QPM  . linagliptin  5 mg Oral Daily  . sodium chloride flush  3 mL Intravenous Q12H  . spironolactone  25 mg Oral Daily  . ticagrelor  90 mg Oral BID   Continuous Infusions: . sodium chloride 10 mL (01/30/19 1810)  . sodium chloride     PRN Meds: sodium chloride, acetaminophen, nitroGLYCERIN, ondansetron (ZOFRAN) IV, sodium chloride flush   Vital Signs    Vitals:   02/01/19 0040 02/01/19 0415 02/01/19 0500 02/01/19 0747  BP: (!) 104/45 (!) 115/44  (!) 140/55  Pulse: 79 72  85  Resp: 16 16  18   Temp: 98.2 F (36.8 C) 98.3 F (36.8 C)  98.3 F (36.8 C)  TempSrc: Oral Oral  Oral  SpO2: 97% 98%  99%  Weight:   63.9 kg   Height:        Intake/Output Summary (Last 24 hours) at 02/01/2019 0955 Last data filed at 02/01/2019 0730 Gross per 24 hour  Intake 340 ml  Output --  Net 340 ml   Last 3 Weights 02/01/2019 01/31/2019 01/31/2019  Weight (lbs) 140 lb 14.4 oz 141 lb 139 lb 15.9 oz  Weight (kg) 63.912 kg 63.957 kg 63.5 kg      Telemetry    SR - Personally Reviewed  ECG    No new tracing this morning  Physical Exam  Pleasant older WF, sitting up in bed. GEN: No acute distress.   Neck: No JVD Cardiac: RRR, no murmurs, rubs, or gallops.  Respiratory: Clear to auscultation bilaterally. GI: Soft, nontender, non-distended  MS: No edema; No deformity. Right radial site bruised but no hematoma.  Neuro:  Nonfocal  Psych: Normal affect   Labs    High Sensitivity Troponin:   Recent Labs  Lab 01/30/19 1727 01/30/19 2047 01/30/19 2300  TROPONINIHS 689* 6,609* 6,918*      Chemistry Recent Labs  Lab  01/30/19 1727 01/31/19 0208  NA 132* 134*  K 4.2 4.1  CL 99 103  CO2 20* 20*  GLUCOSE 147* 141*  BUN 24* 27*  CREATININE 1.26* 1.29*  CALCIUM 10.1 9.2  PROT 8.0  --   ALBUMIN 4.5  --   AST 26  --   ALT 26  --   ALKPHOS 91  --   BILITOT 0.8  --   GFRNONAA 40* 39*  GFRAA 46* 45*  ANIONGAP 13 11     Hematology Recent Labs  Lab 01/30/19 1727 01/31/19 0208  WBC 13.1* 11.8*  RBC 4.90 4.29  HGB 13.4 11.7*  HCT 41.5 34.7*  MCV 84.7 80.9  MCH 27.3 27.3  MCHC 32.3 33.7  RDW 14.7 14.8  PLT 451* 381    BNPNo results for input(s): BNP, PROBNP in the last 168 hours.   DDimer No results for input(s): DDIMER in the last 168 hours.   Radiology    CARDIAC CATHETERIZATION  Result Date: 01/30/2019  Prox RCA lesion is 60% stenosed.  Mid RCA lesion is 40% stenosed.  Prox Cx to Mid Cx lesion is 20% stenosed.  Mid LAD lesion is 99% stenosed.  A drug-eluting stent was successfully placed using a SYNERGY XD 3.0X28.  Post intervention, there is a 0% residual stenosis.  1. Severe stenosis mid LAD 2. Successful PTCA/DES x 1 mid LAD 3. Mild non-obstructive disease in the Circumflex 4. Moderate non-obstructive disease in the mid RCA Recommendations: Will admit to the ICU. DAPT with ASA and Brilinta for one year. Continue home beta blocker. Echo in am. She is statin intolerant. Fast track discharge, possibly home tomorrow if stable.   DG Chest Port 1 View  Result Date: 01/30/2019 CLINICAL DATA:  Chest pain EXAM: PORTABLE CHEST 1 VIEW COMPARISON:  None. FINDINGS: The heart size and mediastinal contours are within normal limits. Both lungs are clear. The visualized skeletal structures are unremarkable. IMPRESSION: No active disease. Electronically Signed   By: Kerby Moors M.D.   On: 01/30/2019 17:57   ECHOCARDIOGRAM COMPLETE  Result Date: 01/31/2019   ECHOCARDIOGRAM REPORT   Patient Name:   Crystal [name] Date of Exam: 01/31/2019 Medical Rec #:  [id number]        Height:       65.0 in  Accession #:    [id number]       Weight:       140.0 lb Date of Birth:  24-Apr-1934        BSA:          1.70 m Patient Age:    85 years         BP:           107/72 mmHg Patient Gender: F                HR:           73 bpm. Exam Location:  Inpatient Procedure: 2D Echo, Color Doppler and Cardiac Doppler Indications:    I25.110 Atherosclerotic heart disease of native coronary artery                 with unstable angina pectoris  History:        Patient has no prior history of Echocardiogram examinations.                 Acute MI; Risk Factors:Hypertension, Diabetes and Dyslipidemia.                 Patient is post stent placement.  Sonographer:    Roseanna Rainbow RDCS Referring Phys: [place]  Sonographer Comments: Image acquisition challenging due to respiratory motion. IMPRESSIONS  1. Left ventricular ejection fraction, by visual estimation, is 30 to 35%. The left ventricle has moderately decreased function. There is no left ventricular hypertrophy.  2. Severe akinesis of the left ventricular, apical apical segment.  3. Left ventricular diastolic parameters are consistent with Grade II diastolic dysfunction (pseudonormalization).  4. The left ventricle demonstrates regional wall motion abnormalities.  5. Global right ventricle has normal systolic function.The right ventricular size is normal. No increase in right ventricular wall thickness.  6. Left atrial size was normal.  7. Right atrial size was normal.  8. The mitral valve is grossly normal. Mild mitral valve regurgitation.  9. The tricuspid valve is normal in structure. 10. The aortic valve is tricuspid. Aortic valve regurgitation is trivial. No evidence of aortic valve sclerosis or stenosis. 11. The pulmonic valve was normal in structure. Pulmonic valve regurgitation is not visualized. 12. The atrial septum is grossly normal. FINDINGS  Left Ventricle: Left ventricular ejection fraction, by visual estimation, is 30 to 35%. The left ventricle has  moderately decreased function. Severe akinesis of the left ventricular, apical apical segment. The left ventricle demonstrates regional wall motion abnormalities. There is no left ventricular hypertrophy. Left ventricular diastolic parameters are consistent with Grade II diastolic dysfunction (pseudonormalization). Right Ventricle: The right ventricular size is normal. No increase in right ventricular wall thickness. Global RV systolic function is has normal systolic function. Left Atrium: Left atrial size was normal in size. Right Atrium: Right atrial size was normal in size Pericardium: There is no evidence of pericardial effusion. Mitral Valve: The mitral valve is grossly normal. Mild mitral valve regurgitation. Tricuspid Valve: The tricuspid valve is normal in structure. Tricuspid valve regurgitation is mild. Aortic Valve: The aortic valve is tricuspid. . There is mild thickening of the aortic valve. Aortic valve regurgitation is trivial. The aortic valve is structurally normal, with no evidence of sclerosis or stenosis. There is mild thickening of the aortic  valve. Pulmonic Valve: The pulmonic valve was normal in structure. Pulmonic valve regurgitation is not visualized. Pulmonic regurgitation is not visualized. Aorta: The aortic root and ascending aorta are structurally normal, with no evidence of dilitation. IAS/Shunts: The atrial septum is grossly normal.  LEFT VENTRICLE PLAX 2D LVIDd:         4.21 cm       Diastology LVIDs:         2.68 cm       LV e' lateral:   4.03 cm/s LV PW:         1.30 cm       LV E/e' lateral: 30.8 LV IVS:        1.40 cm       LV e' medial:    4.13 cm/s LVOT diam:     1.80 cm       LV E/e' medial:  30.0 LV SV:         53 ml LV SV Index:   30.73 LVOT Area:     2.54 cm  LV Volumes (MOD) LV area d, A2C:    26.90 cm LV area d, A4C:    25.80 cm LV area s, A2C:    19.00 cm LV area s, A4C:    21.40 cm LV major d, A2C:   8.06 cm LV major d, A4C:   7.92 cm LV major s, A2C:   8.10 cm LV  major s, A4C:   8.07 cm LV vol d, MOD A2C: 73.3 ml LV vol d, MOD A4C: 69.2 ml LV vol s, MOD A2C: 37.3 ml LV vol s, MOD A4C: 45.4 ml LV SV MOD A2C:     36.0 ml LV SV MOD A4C:     69.2 ml LV SV MOD BP:      31.4 ml RIGHT VENTRICLE             IVC RV S prime:     10.40 cm/s  IVC diam: 1.60 cm TAPSE (M-mode): 2.0 cm LEFT ATRIUM             Index       RIGHT ATRIUM           Index LA diam:        2.40 cm 1.41 cm/m  RA Area:     13.00 cm LA Vol (A2C):   44.2 ml 26.00 ml/m RA Volume:   31.50 ml  18.53 ml/m LA Vol (A4C):   36.2 ml 21.30 ml/m LA Biplane Vol: 40.3 ml 23.71 ml/m  AORTIC VALVE  PULMONIC VALVE LVOT Vmax:   106.00 cm/s PR End Diast Vel: 1.71 msec LVOT Vmean:  69.600 cm/s LVOT VTI:    0.225 m  AORTA Ao Root diam: 2.85 cm Ao Asc diam:  2.80 cm MITRAL VALVE MV Area (PHT): 2.60 cm              SHUNTS MV PHT:        84.68 msec            Systemic VTI:  0.22 m MV Decel Time: 292 msec              Systemic Diam: 1.80 cm MV E velocity: 124.00 cm/s 103 cm/s MV A velocity: 137.00 cm/s 70.3 cm/s MV E/A ratio:  0.91        1.5  Mertie Moores MD Electronically signed by Mertie Moores MD Signature Date/Time: 01/31/2019/1:46:41 PM    Final     Cardiac Studies   Cath: 01/30/19   Prox RCA lesion is 60% stenosed.  Mid RCA lesion is 40% stenosed.  Prox Cx to Mid Cx lesion is 20% stenosed.  Mid LAD lesion is 99% stenosed.  A drug-eluting stent was successfully placed using a SYNERGY XD 3.0X28.  Post intervention, there is a 0% residual stenosis.  1. Severe stenosis mid LAD 2. Successful PTCA/DES x 1 mid LAD 3. Mild non-obstructive disease in the Circumflex 4. Moderate non-obstructive disease in the mid RCA  Recommendations: Will admit to the ICU. DAPT with ASA and Brilinta for one year. Continue home beta blocker. Echo in am. She is statin intolerant. Fast track discharge, possibly home tomorrow if stable.   Diagnostic Dominance: Right  Intervention    TTE:  01/31/19  IMPRESSIONS   1. Left ventricular ejection fraction, by visual estimation, is 30 to 35%. The left ventricle has moderately decreased function. There is no left ventricular hypertrophy. 2. Severe akinesis of the left ventricular, apical apical segment. 3. Left ventricular diastolic parameters are consistent with Grade II diastolic dysfunction (pseudonormalization). 4. The left ventricle demonstrates regional wall motion abnormalities. 5. Global right ventricle has normal systolic function.The right ventricular size is normal. No increase in right ventricular wall thickness. 6. Left atrial size was normal. 7. Right atrial size was normal. 8. The mitral valve is grossly normal. Mild mitral valve regurgitation. 9. The tricuspid valve is normal in structure. 10. The aortic valve is tricuspid. Aortic valve regurgitation is trivial. No evidence of aortic valve sclerosis or stenosis. 11. The pulmonic valve was normal in structure. Pulmonic valve regurgitation is not visualized. 12. The atrial septum is grossly normal. _____________     Patient Profile     85 y.o. female who presented with anterolateral MI, treated with PCI of the LAD using a drug-eluting stent.     Assessment & Plan    1. NSTEMI: severe stenosis mLAD treated with PCI/DES x1. Residual non obstructive disease in the Lcx and mRCA to be treated medically. Placed on DAPT with ASA/Brilinta for at least one year. hsTn peaked at 6918. No chest pain overnight.   2. HTN: reported to have resistant HTN and on several high dose medications. Given decline in her EF will plan to transition from Labetalol to coreg, and chlorthalidone to spiro.  -- monitor with med changes  3. ICM: EF noted at 30-35% on echo. Does not appear volume overloaded on exam. Changes to meds noted above. Could consider Entresto as well.   4. HL: LDL 229, with hx of statin intolerance. Plan for  lipid clinic referral at discharge.   5. DM:  treated with alogliptin prior to admission. On Tradjenta here.   For questions or updates, please contact [place] Please consult [email or web address] for contact info under        Signed, Reino Bellis, NP  02/01/2019, 9:55 AM    I have personally seen and examined this patient. I agree with the assessment and plan as outlined above.  She is doing well post MI. LVEF=30-35%. Will plan one year of DAPT with ASA and Brilinta. Her BP has been difficult to control at home and she is on multiple medications. Will stop Diltiazem, Labetalol and chlorthalidone and will start coreg and spironolactone. It is reasonable to consider transitioning her ARB to [facility]. Given the medication changes today and fluctuation in her BP, will monitor today and plan d/c home tomorrow.   Lauree Chandler 02/01/2019 3:26 PM

## 2019-02-01 NOTE — Progress Notes (Signed)
Right radial bruising marked during shift assessment, reassement at 1700 remains within margins originally drawn.

## 2019-02-01 NOTE — Progress Notes (Signed)
CARDIAC REHAB PHASE I   PRE:  Rate/Rhythm: 73 SR  BP:  Supine:   Sitting: 110/63  Standing:    SaO2: 100%RA  MODE:  Ambulation: 200 ft   POST:  Rate/Rhythm: 77 SR  BP:  Supine:   Sitting: 117/46  Standing:    SaO2: 100%RA 1008-1055 Pt walked 200 ft on RA with asst x 1 with slow steady gait. Encouraged pursed lip breathing. Stopped a couple of times to rest. No CP but some DOE. Sats good on RA. To recliner after walk with call bell. Gave CHF booklet and discussed low sodium diet of 2000 mg, 2L FR and daily weights. Reviewed zone of when to call MD since EF low at 30-35%. Pt voiced understanding. Encouraged more walks with staff.    Graylon Good, RN BSN  02/01/2019 10:49 AM

## 2019-02-01 NOTE — Discharge Summary (Addendum)
Discharge Summary    Patient ID: Crystal [name],  MRN: [id number], DOB/AGE: [id number] y.o.  Admit date: 01/30/2019 Discharge date: 02/02/2019  Primary Care Provider: Glenda Chroman Primary Cardiologist: Lauree Chandler, MD  Discharge Diagnoses    Principal Problem:   STEMI (ST elevation myocardial infarction) [facility]) Active Problems:   Diabetes mellitus ([place])   Hyperlipidemia   Resistant hypertension   Non-ST elevation (NSTEMI) myocardial infarction [facility])   NSTEMI (non-ST elevated myocardial infarction) ([place])   Allergies Allergies  Allergen Reactions  . Bactrim [Sulfamethoxazole-Trimethoprim] Nausea And Vomiting  . Sulfa Antibiotics Nausea And Vomiting  . Amlodipine Other (See Comments)    EDEMA  . Clonidine Derivatives Other (See Comments)    FATIGUE  . Evista [Raloxifene Hcl] Other (See Comments)    GERD  . Fosamax [Alendronate Sodium] Other (See Comments)    INCREASED GERD   . Glipizide Other (See Comments)    PALPITATIONS  . Lipitor [Atorvastatin Calcium] Other (See Comments)    ACHING  . Losartan Other (See Comments)    FATIGUE   . Pravastatin Other (See Comments)    ACHING  . Azithromycin Rash and Other (See Comments)    FACIAL BURNING     Diagnostic Studies/Procedures    Cath: 01/30/19   Prox RCA lesion is 60% stenosed.  Mid RCA lesion is 40% stenosed.  Prox Cx to Mid Cx lesion is 20% stenosed.  Mid LAD lesion is 99% stenosed.  A drug-eluting stent was successfully placed using a SYNERGY XD 3.0X28.  Post intervention, there is a 0% residual stenosis.   1. Severe stenosis mid LAD 2. Successful PTCA/DES x 1 mid LAD 3. Mild non-obstructive disease in the Circumflex 4. Moderate non-obstructive disease in the mid RCA  Recommendations: Will admit to the ICU. DAPT with ASA and Brilinta for one year. Continue home beta blocker. Echo in am. She is statin intolerant. Fast track discharge, possibly home tomorrow if stable.    Diagnostic Dominance: Right  Intervention    TTE: 01/31/19  IMPRESSIONS    1. Left ventricular ejection fraction, by visual estimation, is 30 to 35%. The left ventricle has moderately decreased function. There is no left ventricular hypertrophy.  2. Severe akinesis of the left ventricular, apical apical segment.  3. Left ventricular diastolic parameters are consistent with Grade II diastolic dysfunction (pseudonormalization).  4. The left ventricle demonstrates regional wall motion abnormalities.  5. Global right ventricle has normal systolic function.The right ventricular size is normal. No increase in right ventricular wall thickness.  6. Left atrial size was normal.  7. Right atrial size was normal.  8. The mitral valve is grossly normal. Mild mitral valve regurgitation.  9. The tricuspid valve is normal in structure. 10. The aortic valve is tricuspid. Aortic valve regurgitation is trivial. No evidence of aortic valve sclerosis or stenosis. 11. The pulmonic valve was normal in structure. Pulmonic valve regurgitation is not visualized. 12. The atrial septum is grossly normal. _____________   History of Present Illness      85 y.o. female with a history of DM, HTN, left renal artery stenosis, mild CKD, and breast cancer s/p R mastectomy who presents with chest pain and found to have a STEMI.  Patient had onset of chest pain at approximately 1am.  She took some aspirin, which initially helped some but her pain continued to wax and wane.  She eventually went to the ED at [facility] at [facility].  ECG there was concerning for STEMI (STE  in V4-V6, II/aVF, and I/aVL).  Patient was transferred to [facility] for urgent coronary angiography.   She denied any cardiac history.  No history of tobacco use.  Does have a family history of heart disease.  She denied fevers, chills, cough, dyspnea, edema, weight gain, or any other new symptoms.  Labs in ED notable for Cr 1.26, Tn 689, WBC 13.  Otherwise  unremarkable. Rapid COVID negative.  CXR normal.  Hospital Course     She was brought to the cath lab emergently and underwent cath noted above with severe stenosis in the mLAD, successful PCI/DES x1. Residual non obstructive disease in the Lcx and mRCA to be treated medically. Placed on DAPT with ASA/Brilinta for at least one year. hsTn peaked at 6918. She was monitored in the ICU overnight and transferred out the following morning. Known to have resistant hypertension and treated with combination of chlorthalidone, Diltiazem, Imdur, labetalol and losartan prior to admission. Also hx of statin intolerance and will plan for referral to lipid clinic as an outpatient. LDL 229. Follow up echo showed an EF of 30-35% with severe akinesis in the left ventricular and apical segments, G2DD. Given this decline in her LV function, her Diltiazem was stopped and labetalol switched to coreg 25mg  BID. Also reports she had been on spiro in the past, this was restarted on 25mg  daily. She worked well with cardiac rehab without recurrent chest pain. She was continued on her home DM medication regimen.   General: Well developed, well nourished, female appearing in no acute distress. Head: Normocephalic, atraumatic.  Neck: Supple without bruits, JVD. Lungs:  Resp regular and unlabored, CTA. Heart: RRR, S1, S2, no S3, S4, soft systolic murmur; no rub. Abdomen: Soft, non-tender, non-distended with normoactive bowel sounds. No hepatomegaly. No rebound/guarding. No obvious abdominal masses. Extremities: No clubbing, cyanosis, edema. Distal pedal pulses are 2+ bilaterally. Right radial cath site with bruising extending into the forearm but no hematoma noted.  Neuro: Alert and oriented X 3. Moves all extremities spontaneously. Psych: Normal affect.  Crystal [name] was seen by Dr. Angelena Form and determined stable for discharge home. Follow up in the office has been arranged. Medications are listed below.    _____________  Discharge Vitals Blood pressure (!) 140/54, pulse 81, temperature 98.2 F (36.8 C), temperature source Oral, resp. rate 19, height 5\' 5"  (1.651 m), weight 64.5 kg, SpO2 98 %.  Filed Weights   01/31/19 1351 02/01/19 0500 02/02/19 0312  Weight: 64 kg 63.9 kg 64.5 kg    Labs & Radiologic Studies    CBC Recent Labs    01/30/19 1727 01/31/19 0208  WBC 13.1* 11.8*  NEUTROABS 8.4*  --   HGB 13.4 11.7*  HCT 41.5 34.7*  MCV 84.7 80.9  PLT 451* 081   Basic Metabolic Panel Recent Labs    01/30/19 1727 01/31/19 0208  NA 132* 134*  K 4.2 4.1  CL 99 103  CO2 20* 20*  GLUCOSE 147* 141*  BUN 24* 27*  CREATININE 1.26* 1.29*  CALCIUM 10.1 9.2   Liver Function Tests Recent Labs    01/30/19 1727  AST 26  ALT 26  ALKPHOS 91  BILITOT 0.8  PROT 8.0  ALBUMIN 4.5   No results for input(s): LIPASE, AMYLASE in the last 72 hours. Cardiac Enzymes No results for input(s): CKTOTAL, CKMB, CKMBINDEX, TROPONINI in the last 72 hours. BNP Invalid input(s): POCBNP D-Dimer No results for input(s): DDIMER in the last 72 hours. Hemoglobin A1C No results for input(s): HGBA1C  in the last 72 hours. Fasting Lipid Panel Recent Labs    01/30/19 1727  CHOL 331*  HDL 68  LDLCALC 229*  TRIG 169*  CHOLHDL 4.9   Thyroid Function Tests No results for input(s): TSH, T4TOTAL, T3FREE, THYROIDAB in the last 72 hours.  Invalid input(s): FREET3 _____________  CARDIAC CATHETERIZATION  Result Date: 01/30/2019  Prox RCA lesion is 60% stenosed.  Mid RCA lesion is 40% stenosed.  Prox Cx to Mid Cx lesion is 20% stenosed.  Mid LAD lesion is 99% stenosed.  A drug-eluting stent was successfully placed using a SYNERGY XD 3.0X28.  Post intervention, there is a 0% residual stenosis.  1. Severe stenosis mid LAD 2. Successful PTCA/DES x 1 mid LAD 3. Mild non-obstructive disease in the Circumflex 4. Moderate non-obstructive disease in the mid RCA Recommendations: Will admit to the ICU. DAPT  with ASA and Brilinta for one year. Continue home beta blocker. Echo in am. She is statin intolerant. Fast track discharge, possibly home tomorrow if stable.   DG Chest Port 1 View  Result Date: 01/30/2019 CLINICAL DATA:  Chest pain EXAM: PORTABLE CHEST 1 VIEW COMPARISON:  None. FINDINGS: The heart size and mediastinal contours are within normal limits. Both lungs are clear. The visualized skeletal structures are unremarkable. IMPRESSION: No active disease. Electronically Signed   By: Kerby Moors M.D.   On: 01/30/2019 17:57   ECHOCARDIOGRAM COMPLETE  Result Date: 01/31/2019   ECHOCARDIOGRAM REPORT   Patient Name:   Crystal [name] Date of Exam: 01/31/2019 Medical Rec #:  [id number]        Height:       65.0 in Accession #:    [id number]       Weight:       140.0 lb Date of Birth:  March 28, 1934        BSA:          1.70 m Patient Age:    16 years         BP:           107/72 mmHg Patient Gender: F                HR:           73 bpm. Exam Location:  Inpatient Procedure: 2D Echo, Color Doppler and Cardiac Doppler Indications:    I25.110 Atherosclerotic heart disease of native coronary artery                 with unstable angina pectoris  History:        Patient has no prior history of Echocardiogram examinations.                 Acute MI; Risk Factors:Hypertension, Diabetes and Dyslipidemia.                 Patient is post stent placement.  Sonographer:    Roseanna Rainbow RDCS Referring Phys: [place]  Sonographer Comments: Image acquisition challenging due to respiratory motion. IMPRESSIONS  1. Left ventricular ejection fraction, by visual estimation, is 30 to 35%. The left ventricle has moderately decreased function. There is no left ventricular hypertrophy.  2. Severe akinesis of the left ventricular, apical apical segment.  3. Left ventricular diastolic parameters are consistent with Grade II diastolic dysfunction (pseudonormalization).  4. The left ventricle demonstrates regional wall motion  abnormalities.  5. Global right ventricle has normal systolic function.The right ventricular size is normal. No increase in right ventricular wall thickness.  6. Left atrial size was normal.  7. Right atrial size was normal.  8. The mitral valve is grossly normal. Mild mitral valve regurgitation.  9. The tricuspid valve is normal in structure. 10. The aortic valve is tricuspid. Aortic valve regurgitation is trivial. No evidence of aortic valve sclerosis or stenosis. 11. The pulmonic valve was normal in structure. Pulmonic valve regurgitation is not visualized. 12. The atrial septum is grossly normal. FINDINGS  Left Ventricle: Left ventricular ejection fraction, by visual estimation, is 30 to 35%. The left ventricle has moderately decreased function. Severe akinesis of the left ventricular, apical apical segment. The left ventricle demonstrates regional wall motion abnormalities. There is no left ventricular hypertrophy. Left ventricular diastolic parameters are consistent with Grade II diastolic dysfunction (pseudonormalization). Right Ventricle: The right ventricular size is normal. No increase in right ventricular wall thickness. Global RV systolic function is has normal systolic function. Left Atrium: Left atrial size was normal in size. Right Atrium: Right atrial size was normal in size Pericardium: There is no evidence of pericardial effusion. Mitral Valve: The mitral valve is grossly normal. Mild mitral valve regurgitation. Tricuspid Valve: The tricuspid valve is normal in structure. Tricuspid valve regurgitation is mild. Aortic Valve: The aortic valve is tricuspid. . There is mild thickening of the aortic valve. Aortic valve regurgitation is trivial. The aortic valve is structurally normal, with no evidence of sclerosis or stenosis. There is mild thickening of the aortic  valve. Pulmonic Valve: The pulmonic valve was normal in structure. Pulmonic valve regurgitation is not visualized. Pulmonic regurgitation  is not visualized. Aorta: The aortic root and ascending aorta are structurally normal, with no evidence of dilitation. IAS/Shunts: The atrial septum is grossly normal.  LEFT VENTRICLE PLAX 2D LVIDd:         4.21 cm       Diastology LVIDs:         2.68 cm       LV e' lateral:   4.03 cm/s LV PW:         1.30 cm       LV E/e' lateral: 30.8 LV IVS:        1.40 cm       LV e' medial:    4.13 cm/s LVOT diam:     1.80 cm       LV E/e' medial:  30.0 LV SV:         53 ml LV SV Index:   30.73 LVOT Area:     2.54 cm  LV Volumes (MOD) LV area d, A2C:    26.90 cm LV area d, A4C:    25.80 cm LV area s, A2C:    19.00 cm LV area s, A4C:    21.40 cm LV major d, A2C:   8.06 cm LV major d, A4C:   7.92 cm LV major s, A2C:   8.10 cm LV major s, A4C:   8.07 cm LV vol d, MOD A2C: 73.3 ml LV vol d, MOD A4C: 69.2 ml LV vol s, MOD A2C: 37.3 ml LV vol s, MOD A4C: 45.4 ml LV SV MOD A2C:     36.0 ml LV SV MOD A4C:     69.2 ml LV SV MOD BP:      31.4 ml RIGHT VENTRICLE             IVC RV S prime:     10.40 cm/s  IVC diam: 1.60 cm TAPSE (M-mode): 2.0 cm LEFT ATRIUM  Index       RIGHT ATRIUM           Index LA diam:        2.40 cm 1.41 cm/m  RA Area:     13.00 cm LA Vol (A2C):   44.2 ml 26.00 ml/m RA Volume:   31.50 ml  18.53 ml/m LA Vol (A4C):   36.2 ml 21.30 ml/m LA Biplane Vol: 40.3 ml 23.71 ml/m  AORTIC VALVE             PULMONIC VALVE LVOT Vmax:   106.00 cm/s PR End Diast Vel: 1.71 msec LVOT Vmean:  69.600 cm/s LVOT VTI:    0.225 m  AORTA Ao Root diam: 2.85 cm Ao Asc diam:  2.80 cm MITRAL VALVE MV Area (PHT): 2.60 cm              SHUNTS MV PHT:        84.68 msec            Systemic VTI:  0.22 m MV Decel Time: 292 msec              Systemic Diam: 1.80 cm MV E velocity: 124.00 cm/s 103 cm/s MV A velocity: 137.00 cm/s 70.3 cm/s MV E/A ratio:  0.91        1.5  Mertie Moores MD Electronically signed by Mertie Moores MD Signature Date/Time: 01/31/2019/1:46:41 PM    Final    Disposition   Pt is being discharged home today in  good condition.  Follow-up Plans & Appointments    Follow-up Information    Glenda Chroman, MD. Go on 02/09/2019.   Specialty: Internal Medicine Why: @11 :45am Contact information: [place] [address] [phone]        Imogene Burn, PA-C Follow up on 02/16/2019.   Specialty: Cardiology Why: at 11:30am for your follow up appt.  Contact information: [address] [place] [address] [phone]          Discharge Instructions    Amb Referral to Cardiac Rehabilitation   Complete by: As directed    Diagnosis:  NSTEMI Coronary Stents     After initial evaluation and assessments completed: Virtual Based Care may be provided alone or in conjunction with Phase 2 Cardiac Rehab based on patient barriers.: Yes   Diet - low sodium heart healthy   Complete by: As directed    Discharge instructions   Complete by: As directed    Radial Site Care Refer to this sheet in the next few weeks. These instructions provide you with information on caring for yourself after your procedure. Your caregiver may also give you more specific instructions. Your treatment has been planned according to current medical practices, but problems sometimes occur. Call your caregiver if you have any problems or questions after your procedure. HOME CARE INSTRUCTIONS You may shower the day after the procedure.Remove the bandage (dressing) and gently wash the site with plain soap and water.Gently pat the site dry.  Do not apply powder or lotion to the site.  Do not submerge the affected site in water for 3 to 5 days.  Inspect the site at least twice daily.  Do not flex or bend the affected arm for 24 hours.  No lifting over 5 pounds (2.3 kg) for 5 days after your procedure.  Do not drive home if you are discharged the same day of the procedure. Have someone else drive you.  You may drive 24  hours after the procedure unless otherwise instructed by your caregiver.  What to  expect: Any bruising will usually fade within 1 to 2 weeks.  Blood that collects in the tissue (hematoma) may be painful to the touch. It should usually decrease in size and tenderness within 1 to 2 weeks.  SEEK IMMEDIATE MEDICAL CARE IF: You have unusual pain at the radial site.  You have redness, warmth, swelling, or pain at the radial site.  You have drainage (other than a small amount of blood on the dressing).  You have chills.  You have a fever or persistent symptoms for more than 72 hours.  You have a fever and your symptoms suddenly get worse.  Your arm becomes pale, cool, tingly, or numb.  You have heavy bleeding from the site. Hold pressure on the site.   PLEASE DO NOT MISS ANY DOSES OF YOUR BRILINTA!!!!! Also keep a log of you blood pressures and bring back to your follow up appt. Please call the office with any questions.   Patients taking blood thinners should generally stay away from medicines like ibuprofen, Advil, Motrin, naproxen, and Aleve due to risk of stomach bleeding. You may take Tylenol as directed or talk to your primary doctor about alternatives.   Increase activity slowly   Complete by: As directed        Discharge Medications     Medication List    STOP taking these medications   diltiazem 300 MG 24 hr capsule Commonly known as: CARDIZEM CD   labetalol 200 MG tablet Commonly known as: NORMODYNE     TAKE these medications   acetaminophen 500 MG tablet Commonly known as: TYLENOL Take 500 mg by mouth every 6 (six) hours as needed for headache (pain).   Alogliptin Benzoate 25 MG Tabs Take 12.5 mg by mouth daily at 2 PM.   aspirin EC 81 MG tablet Take 81 mg by mouth daily.   CALCIUM 500 + D PO Take 1 tablet by mouth daily.   candesartan 32 MG tablet Commonly known as: ATACAND Take 32 mg by mouth daily with breakfast.   carvedilol 25 MG tablet Commonly known as: COREG Take 1 tablet (25 mg total) by mouth 2 (two) times daily with a meal.    cholecalciferol 25 MCG (1000 UT) tablet Commonly known as: VITAMIN D3 Take 1,000 Units by mouth daily with breakfast.   CINNAMON PO Take 1 capsule by mouth daily with supper.   diazepam 2 MG tablet Commonly known as: VALIUM Take 2 mg by mouth 2 (two) times daily as needed (vertigo/dizziness).   isosorbide mononitrate 30 MG 24 hr tablet Commonly known as: IMDUR TAKE 1 TABLET IN THE EVENING. What changed: when to take this   meclizine 25 MG tablet Commonly known as: ANTIVERT Take 25 mg by mouth 2 (two) times daily as needed for dizziness (vertigo).   multivitamin with minerals Tabs tablet Take 1 tablet by mouth every morning.   multivitamin-lutein Caps capsule Take 1 capsule by mouth every morning.   nitroGLYCERIN 0.4 MG SL tablet Commonly known as: NITROSTAT Place 1 tablet (0.4 mg total) under the tongue every 5 (five) minutes x 3 doses as needed for chest pain.   spironolactone 25 MG tablet Commonly known as: ALDACTONE Take 25 mg by mouth daily with lunch.   ticagrelor 90 MG Tabs tablet Commonly known as: BRILINTA Take 1 tablet (90 mg total) by mouth 2 (two) times daily.       Yes  AHA/ACC Clinical Performance & Quality Measures: 1. Aspirin prescribed? - Yes 2. ADP Receptor Inhibitor (Plavix/Clopidogrel, Brilinta/Ticagrelor or Effient/Prasugrel) prescribed (includes medically managed patients)? - Yes 3. Beta Blocker prescribed? - Yes 4. High Intensity Statin (Lipitor 40-80mg  or Crestor 20-40mg ) prescribed? - No - statin intolerant  5. EF assessed during THIS hospitalization? - Yes 6. For EF <40%, was ACEI/ARB prescribed? - Yes 7. For EF <40%, Aldosterone Antagonist (Spironolactone or Eplerenone) prescribed? - Yes 8. Cardiac Rehab Phase II ordered (Included Medically managed Patients)? - Yes   Outstanding Labs/Studies   Lipid clinic referral done at discharge.   Duration of Discharge Encounter   Greater than 30 minutes including  physician time.  Signed, Reino Bellis NP-C 02/02/2019, 12:11 PM  Patient seen, examined. Available data reviewed. Agree with findings, assessment, and plan as outlined by Reino Bellis, NP.  On my exam, the patient is an elderly woman in no distress.  JVP is normal, lungs are clear, heart is regular rate and rhythm with no murmur gallop, abdomen is soft and nontender, extremities have no edema.  The patient is noted to have severe LV dysfunction after presenting with acute MI.  LVEF is less than 35%.  She has been started on appropriate medical therapy with an ARB, carvedilol, and spironolactone.  She is tolerating her initial therapy well.  It would be reasonable to change her to [facility] as an outpatient.  She should have a follow-up echocardiogram in 3 months.  She is medically stable for discharge this morning.  Otherwise as outlined above.  Sherren Mocha, M.D. 02/02/2019 2:45 PM

## 2019-02-02 ENCOUNTER — Telehealth: Payer: Self-pay | Admitting: Cardiovascular Disease

## 2019-02-02 LAB — BASIC METABOLIC PANEL
Anion gap: 8 (ref 5–15)
BUN: 35 mg/dL — ABNORMAL HIGH (ref 8–23)
CO2: 23 mmol/L (ref 22–32)
Calcium: 9.1 mg/dL (ref 8.9–10.3)
Chloride: 103 mmol/L (ref 98–111)
Creatinine, Ser: 1.49 mg/dL — ABNORMAL HIGH (ref 0.44–1.00)
GFR calc Af Amer: 38 mL/min — ABNORMAL LOW (ref >60)
GFR calc non Af Amer: 32 mL/min — ABNORMAL LOW (ref >60)
Glucose, Bld: 113 mg/dL — ABNORMAL HIGH (ref 70–99)
Potassium: 4.4 mmol/L (ref 3.5–5.1)
Sodium: 134 mmol/L — ABNORMAL LOW (ref 135–145)

## 2019-02-02 MED ORDER — CARVEDILOL 25 MG PO TABS: 25.0000 mg | ORAL_TABLET | Freq: Two times a day (BID) | ORAL | 2 refills | Status: DC

## 2019-02-02 MED ORDER — TICAGRELOR 90 MG PO TABS: 90.0000 mg | ORAL_TABLET | Freq: Two times a day (BID) | ORAL | 2 refills | Status: DC

## 2019-02-02 MED ORDER — NITROGLYCERIN 0.4 MG SL SUBL: 0.4000 mg | SUBLINGUAL_TABLET | SUBLINGUAL | 2 refills | Status: DC | PRN

## 2019-02-02 MED FILL — NITROGLYCERIN 0.4 MG TAB SL: 0.4 | 8 days supply | Qty: 25 | Fill #0

## 2019-02-02 MED FILL — CARVEDILOL 25 MG TABLET: 25 | 30 days supply | Qty: 60 | Fill #0

## 2019-02-02 MED FILL — BRILINTA 90 MG TABLET: 90 | 30 days supply | Qty: 60 | Fill #0

## 2019-02-02 NOTE — Progress Notes (Signed)
CARDIAC REHAB PHASE I   PRE:  Rate/Rhythm: 80 SR  BP:  Supine:   Sitting: 139/66  Standing:    SaO2: 100%RA  MODE:  Ambulation: 200 ft   POST:  Rate/Rhythm: 97 SR  BP:  Supine:   Sitting: 156/72  Standing:    SaO2: 100%RA 1004-1030 Pt walked 200 ft on RA with hand held asst. Stopped at 100 ft to rest. Pt still gets SOB with activity. Encouraged her to go home with nephew until she feels stronger. Knows to increase distance and walks as she feels ready.   Graylon Good, RN BSN  02/02/2019 10:25 AM

## 2019-02-02 NOTE — Telephone Encounter (Signed)
**Note De-identified Crystal [name] Obfuscation** The pt is being discharged from the hospital today. We will call her tomorrow. 

## 2019-02-02 NOTE — Telephone Encounter (Signed)
New message   Per Wenda Low scheduled a TOC appt with Ermalinda Barrios on 02/16/19 at 11:30 am.

## 2019-02-02 NOTE — Progress Notes (Addendum)
Discharge education and medication education given to patient  with teach back. Education on diet and increasing activity slowly given. All questions and concerns answered. Peripheral IV's and telemetry leads removed. All patient belongings given to patient. Patient transported to main entrance by nurse via wheelchair.

## 2019-02-02 NOTE — Care Management Important Message (Signed)
Important Message  Patient Details  Name: Crystal [name] MRN: [id number] Date of Birth: 1934/12/22   Medicare Important Message Given:  Yes     [name] 02/02/2019, 12:28 PM

## 2019-02-03 NOTE — Telephone Encounter (Signed)
Patient contacted regarding discharge from [facility] on 02/02/2019.  Patient understands to follow up with provider Ermalinda Barrios, PA-c on 02/16/2019 at 11:30 at [facility] in [place], [place] [address]. Patient understands discharge instructions? Yes Patient understands medications and regiment? Yes Patient understands to bring all medications to this visit? Yes

## 2019-02-09 DIAGNOSIS — I214 Non-ST elevation (NSTEMI) myocardial infarction: Secondary | ICD-10-CM | POA: Diagnosis not present

## 2019-02-09 DIAGNOSIS — Z6821 Body mass index (BMI) 21.0-21.9, adult: Secondary | ICD-10-CM | POA: Diagnosis not present

## 2019-02-09 DIAGNOSIS — Z299 Encounter for prophylactic measures, unspecified: Secondary | ICD-10-CM | POA: Diagnosis not present

## 2019-02-09 DIAGNOSIS — E1165 Type 2 diabetes mellitus with hyperglycemia: Secondary | ICD-10-CM | POA: Diagnosis not present

## 2019-02-09 DIAGNOSIS — I25119 Atherosclerotic heart disease of native coronary artery with unspecified angina pectoris: Secondary | ICD-10-CM | POA: Diagnosis not present

## 2019-02-09 DIAGNOSIS — C50919 Malignant neoplasm of unspecified site of unspecified female breast: Secondary | ICD-10-CM | POA: Diagnosis not present

## 2019-02-09 DIAGNOSIS — I10 Essential (primary) hypertension: Secondary | ICD-10-CM | POA: Diagnosis not present

## 2019-02-15 NOTE — Progress Notes (Signed)
Cardiology Office Note    Date:  02/16/2019   ID:  [name], DOB 1934-11-28, MRN [id number]  PCP:  Glenda Chroman, MD  Cardiologist: Lauree Chandler, MD EPS: None  No chief complaint on file.   History of Present Illness:  Crystal [name] is a 85 y.o. female  with a history of DM,resistant HTN, left renal artery stenosis, mild CKD, and breast cancer s/p R mastectomy.   Patient had STEMI  DES LAD 01/2019 Residual non obstructive disease in the Lcx and mRCA to be treated medically. Placed on DAPT with ASA/Brilinta for at least one year. hsTn peaked at 6918.  2D echo showed LVEF 30 to 35% with severe akinesis of the left ventricle and apical segments with grade 2 DD.  Diltiazem was stopped and she was placed on Coreg.  spironolactone was restarted.  History of statin intolerance and referred to the lipid clinic.  LDL was 229.  Patient says she was doing well but nervous about coming in today and became short of breath walking in. BP is up but readings from home 130-156/70's. She says it's the best it's been in years. Denies chest pain, dizziness, palpitations, edema.Watching sodium intake. Patient says her insurance won't pay for PCSK9 inhibitor.     Past Medical History:  Diagnosis Date  . Bell's palsy   . Breast cancer ([place]) 1998   Right mastectomy  . Essential hypertension   . GERD (gastroesophageal reflux disease)   . Gout   . History of skin cancer    Squamous cell, left shoulder  . History of stroke   . Mixed hyperlipidemia   . Osteopenia   . Reflux esophagitis   . Thyroid nodule   . Type 2 diabetes mellitus ([place])     Past Surgical History:  Procedure Laterality Date  . ABDOMINAL HYSTERECTOMY    . CATARACT EXTRACTION  2016  . CORONARY/GRAFT ACUTE MI REVASCULARIZATION N/A 01/30/2019   Procedure: Coronary/Graft Acute MI Revascularization;  Surgeon: Burnell Blanks, MD;  Location: [place] [facility];  Service: Cardiovascular;  Laterality: N/A;  . LEFT  HEART CATH AND CORONARY ANGIOGRAPHY N/A 01/30/2019   Procedure: LEFT HEART CATH AND CORONARY ANGIOGRAPHY;  Surgeon: Burnell Blanks, MD;  Location: [facility] [facility];  Service: Cardiovascular;  Laterality: N/A;  . Right mastectomy  1998   [place]    Current Medications: Current Meds  Medication Sig  . acetaminophen (TYLENOL) 500 MG tablet Take 500 mg by mouth every 6 (six) hours as needed for headache (pain).  . Alogliptin Benzoate 25 MG TABS Take 12.5 mg by mouth daily at 2 PM.  . aspirin EC 81 MG tablet Take 81 mg by mouth daily.  . Calcium Carbonate-Vitamin D (CALCIUM 500 + D PO) Take 1 tablet by mouth daily.   . candesartan (ATACAND) 32 MG tablet Take 32 mg by mouth daily with breakfast.   . carvedilol (COREG) 25 MG tablet Take 1 tablet (25 mg total) by mouth 2 (two) times daily with a meal.  . cholecalciferol (VITAMIN D3) 25 MCG (1000 UT) tablet Take 1,000 Units by mouth daily with breakfast.  . CINNAMON PO Take 1 capsule by mouth daily with supper.  . diazepam (VALIUM) 2 MG tablet Take 2 mg by mouth 2 (two) times daily as needed (vertigo/dizziness).   . isosorbide mononitrate (IMDUR) 30 MG 24 hr tablet TAKE 1 TABLET IN THE EVENING. (Patient taking differently: Take 30 mg by mouth at bedtime. )  . meclizine (ANTIVERT) 25  MG tablet Take 25 mg by mouth 2 (two) times daily as needed for dizziness (vertigo).   . Multiple Vitamin (MULTIVITAMIN WITH MINERALS) TABS tablet Take 1 tablet by mouth every morning.  . multivitamin-lutein (OCUVITE-LUTEIN) CAPS capsule Take 1 capsule by mouth every morning.   . nitroGLYCERIN (NITROSTAT) 0.4 MG SL tablet Place 1 tablet (0.4 mg total) under the tongue every 5 (five) minutes x 3 doses as needed for chest pain.  Marland Kitchen spironolactone (ALDACTONE) 25 MG tablet Take 25 mg by mouth daily with lunch.  . ticagrelor (BRILINTA) 90 MG TABS tablet Take 1 tablet (90 mg total) by mouth 2 (two) times daily.     Allergies:   Bactrim  [sulfamethoxazole-trimethoprim], Sulfa antibiotics, Amlodipine, Clonidine derivatives, Evista [raloxifene hcl], Fosamax [alendronate sodium], Glipizide, Lipitor [atorvastatin calcium], Losartan, Pravastatin, and Azithromycin   Social History   Socioeconomic History  . Marital status: Divorced    Spouse name: Not on file  . Number of children: Not on file  . Years of education: Not on file  . Highest education level: Not on file  Occupational History  . Not on file  Tobacco Use  . Smoking status: Never Smoker  . Smokeless tobacco: Never Used  Substance and Sexual Activity  . Alcohol use: Never  . Drug use: Never  . Sexual activity: Not on file  Other Topics Concern  . Not on file  Social History Narrative  . Not on file   Social Determinants of Health   Financial Resource Strain:   . Difficulty of Paying Living Expenses: Not on file  Food Insecurity:   . Worried About Charity fundraiser in the Last Year: Not on file  . Ran Out of Food in the Last Year: Not on file  Transportation Needs:   . Lack of Transportation (Medical): Not on file  . Lack of Transportation (Non-Medical): Not on file  Physical Activity:   . Days of Exercise per Week: Not on file  . Minutes of Exercise per Session: Not on file  Stress:   . Feeling of Stress : Not on file  Social Connections:   . Frequency of Communication with Friends and Family: Not on file  . Frequency of Social Gatherings with Friends and Family: Not on file  . Attends Religious Services: Not on file  . Active Member of Clubs or Organizations: Not on file  . Attends Archivist Meetings: Not on file  . Marital Status: Not on file     Family History:  The patient's   family history includes Aneurysm in her sister; Cancer in her sister; Heart attack in her father, maternal uncle, maternal uncle, maternal uncle, paternal aunt, paternal aunt, paternal grandfather, and sister; Heart disease in her maternal uncle, maternal  uncle, maternal uncle, paternal aunt, paternal aunt, paternal grandfather, and sister; Stroke in her mother.   ROS:   Please see the history of present illness.    ROS All other systems reviewed and are negative.   PHYSICAL EXAM:   VS:  BP (!) 152/72   Pulse 79   Ht 5\' 5"  (1.651 m)   Wt 151 lb (68.5 kg)   SpO2 96%   BMI 25.13 kg/m   Physical Exam  GEN: Well nourished, well developed, in no acute distress  Neck: no JVD, carotid bruits, or masses Cardiac:RRR; no murmurs, rubs, or gallops  Respiratory:  clear to auscultation bilaterally, normal work of breathing GI: soft, nontender, nondistended, + BS Ext: without cyanosis, clubbing, or  edema, Good distal pulses bilaterally Neuro:  Alert and Oriented x 3 Psych: euthymic mood, full affect  Wt Readings from Last 3 Encounters:  02/16/19 151 lb (68.5 kg)  02/02/19 142 lb 4.8 oz (64.5 kg)  09/16/17 143 lb 9.6 oz (65.1 kg)      Studies/Labs Reviewed:   EKG:  EKG is not ordered today.   Recent Labs: 01/30/2019: ALT 26 01/31/2019: Hemoglobin 11.7; Platelets 381 02/02/2019: BUN 35; Creatinine, Ser 1.49; Potassium 4.4; Sodium 134   Lipid Panel    Component Value Date/Time   CHOL 331 (H) 01/30/2019 1727   TRIG 169 (H) 01/30/2019 1727   HDL 68 01/30/2019 1727   CHOLHDL 4.9 01/30/2019 1727   VLDL 34 01/30/2019 1727   LDLCALC 229 (H) 01/30/2019 1727    Additional studies/ records that were reviewed today include:    Cath: 01/30/19    Prox RCA lesion is 60% stenosed.  Mid RCA lesion is 40% stenosed.  Prox Cx to Mid Cx lesion is 20% stenosed.  Mid LAD lesion is 99% stenosed.  A drug-eluting stent was successfully placed using a SYNERGY XD 3.0X28.  Post intervention, there is a 0% residual stenosis.   1. Severe stenosis mid LAD 2. Successful PTCA/DES x 1 mid LAD 3. Mild non-obstructive disease in the Circumflex 4. Moderate non-obstructive disease in the mid RCA   Recommendations: Will admit to the ICU. DAPT with ASA and  Brilinta for one year. Continue home beta blocker. Echo in am. She is statin intolerant. Fast track discharge, possibly home tomorrow if stable.    TTE: 01/31/19   IMPRESSIONS      1. Left ventricular ejection fraction, by visual estimation, is 30 to 35%. The left ventricle has moderately decreased function. There is no left ventricular hypertrophy.  2. Severe akinesis of the left ventricular, apical apical segment.  3. Left ventricular diastolic parameters are consistent with Grade II diastolic dysfunction (pseudonormalization).  4. The left ventricle demonstrates regional wall motion abnormalities.  5. Global right ventricle has normal systolic function.The right ventricular size is normal. No increase in right ventricular wall thickness.  6. Left atrial size was normal.  7. Right atrial size was normal.  8. The mitral valve is grossly normal. Mild mitral valve regurgitation.  9. The tricuspid valve is normal in structure. 10. The aortic valve is tricuspid. Aortic valve regurgitation is trivial. No evidence of aortic valve sclerosis or stenosis. 11. The pulmonic valve was normal in structure. Pulmonic valve regurgitation is not visualized. 12. The atrial septum is grossly normal. _____________     ASSESSMENT:    1. Coronary artery disease involving native coronary artery of native heart without angina pectoris   2. Ischemic cardiomyopathy   3. Resistant hypertension   4. Hyperlipidemia, unspecified hyperlipidemia type      PLAN:  In order of problems listed above:  CAD status post STEMI treated with DES to the LAD 01/2019 with residual nonobstructive disease in the circumflex and RCA treated medically.  LVEF 30 to 35% with grade 2 DD on echo.  On carvedilol, aspirin, Brilinta-doing well without angina. Also having trouble affording Brilinta. We will give samples today. Lives in [place] and wants to f/u there. Will arrange.  Ischemic cardiomyopathy EF 30 to 35% spironolactone  restarted in the hospital-dyspnea on exertion coming in here today. Waiting to do cardiac rehab  History of resistant hypertension and left renal artery stenosis-BP doing much better. No changes.  Hyperlipidemia statin intolerant referred to lipid clinic-her insurance  says they won't pay for PCSK9 inhibitor-will verify with lipid clinic    Medication Adjustments/Labs and Tests Ordered: Current medicines are reviewed at length with the patient today.  Concerns regarding medicines are outlined above.  Medication changes, Labs and Tests ordered today are listed in the Patient Instructions below. Patient Instructions  Medication Instructions:  Your physician recommends that you continue on your current medications as directed. Please refer to the Current Medication list given to you today.  *If you need a refill on your cardiac medications before your next appointment, please call your pharmacy*  Lab Work: TODAY: BMET  If you have labs (blood work) drawn today and your tests are completely normal, you will receive your results only by: Marland Kitchen MyChart Message (if you have MyChart) OR . A paper copy in the mail If you have any lab test that is abnormal or we need to change your treatment, we will call you to review the results.  Testing/Procedures: None ordered  Follow-Up: At [name], you and your health needs are our priority.  As part of our continuing mission to provide you with exceptional heart care, we have created designated Provider Care Teams.  These Care Teams include your primary Cardiologist (physician) and Advanced Practice Providers (APPs -  Physician Assistants and Nurse Practitioners) who all work together to provide you with the care you need, when you need it.  Your next appointment:   2 month(s)  The format for your next appointment:   In Person  Provider:   Dr. Harl Bowie in the [facility] office  Other Instructions      Signed, Ermalinda Barrios, PA-C  02/16/2019 12:30  PM    [address], [place], [place]  [address] Phone: [phone]; Fax: [phone]

## 2019-02-16 ENCOUNTER — Other Ambulatory Visit: Payer: Self-pay

## 2019-02-16 ENCOUNTER — Encounter: Payer: Self-pay | Admitting: Physician Assistant

## 2019-02-16 ENCOUNTER — Ambulatory Visit (INDEPENDENT_AMBULATORY_CARE_PROVIDER_SITE_OTHER): Payer: Medicare Other | Admitting: Physician Assistant

## 2019-02-16 VITALS — BP 152/72 | HR 79 | Ht 65.0 in | Wt 151.0 lb

## 2019-02-16 DIAGNOSIS — I2510 Atherosclerotic heart disease of native coronary artery without angina pectoris: Secondary | ICD-10-CM

## 2019-02-16 DIAGNOSIS — I200 Unstable angina: Secondary | ICD-10-CM

## 2019-02-16 DIAGNOSIS — E785 Hyperlipidemia, unspecified: Secondary | ICD-10-CM | POA: Diagnosis not present

## 2019-02-16 DIAGNOSIS — I10 Essential (primary) hypertension: Secondary | ICD-10-CM

## 2019-02-16 DIAGNOSIS — I255 Ischemic cardiomyopathy: Secondary | ICD-10-CM

## 2019-02-16 NOTE — Patient Instructions (Signed)
Medication Instructions:  Your physician recommends that you continue on your current medications as directed. Please refer to the Current Medication list given to you today.  *If you need a refill on your cardiac medications before your next appointment, please call your pharmacy*  Lab Work: TODAY: BMET  If you have labs (blood work) drawn today and your tests are completely normal, you will receive your results only by: Marland Kitchen MyChart Message (if you have MyChart) OR . A paper copy in the mail If you have any lab test that is abnormal or we need to change your treatment, we will call you to review the results.  Testing/Procedures: None ordered  Follow-Up: At [facility], you and your health needs are our priority.  As part of our continuing mission to provide you with exceptional heart care, we have created designated Provider Care Teams.  These Care Teams include your primary Cardiologist (physician) and Advanced Practice Providers (APPs -  Physician Assistants and Nurse Practitioners) who all work together to provide you with the care you need, when you need it.  Your next appointment:   2 month(s)  The format for your next appointment:   In Person  Provider:   Dr. Harl Bowie in the [facility] office  Other Instructions

## 2019-02-17 ENCOUNTER — Other Ambulatory Visit: Payer: Self-pay

## 2019-02-17 DIAGNOSIS — E875 Hyperkalemia: Secondary | ICD-10-CM

## 2019-02-17 LAB — BASIC METABOLIC PANEL
BUN/Creatinine Ratio: 21 (ref 12–28)
BUN: 27 mg/dL (ref 8–27)
CO2: 22 mmol/L (ref 20–29)
Calcium: 10.4 mg/dL — ABNORMAL HIGH (ref 8.7–10.3)
Chloride: 95 mmol/L — ABNORMAL LOW (ref 96–106)
Creatinine, Ser: 1.31 mg/dL — ABNORMAL HIGH (ref 0.57–1.00)
GFR calc Af Amer: 44 mL/min/{1.73_m2} — ABNORMAL LOW (ref >59)
GFR calc non Af Amer: 38 mL/min/{1.73_m2} — ABNORMAL LOW (ref >59)
Glucose: 98 mg/dL (ref 65–99)
Potassium: 5.6 mmol/L — ABNORMAL HIGH (ref 3.5–5.2)
Sodium: 130 mmol/L — ABNORMAL LOW (ref 134–144)

## 2019-02-18 NOTE — Progress Notes (Signed)
Patient ID: Nitasha Jewel                 DOB: 1934/11/10                    MRN: [id number]     HPI: Crystal [name] is a 85 y.o. female patient of Dr. Angelena Form, referred to lipid clinic by Ermalinda Barrios, PA. PMH is significant for CAD, resistant HTN, T2DM, mixed HLD, HFrEF, left renal artery stenosis, mild CKD, breast cancer s/p R mastectomy, gout, GERD, hx of skin cancer, and osteopenia. STEMI  DES LAD 01/2019 Residual non obstructive disease in the Lcx and mRCA to be treated medically. Placed on DAPT with ASA/Brilinta for at least one year. hsTn peaked at 6918.  2D echo showed LVEF 30 to 35% with severe akinesis of the left ventricle and apical segments with grade 2 DD.  Patient was last seen by Ermalinda Barrios, PA where it was noted that patient has a history of statin intolerance and her LDL was 229. The patient stated that her insurance will not cover a PCSK9 inhibitor.   Patient arrives in lipid clinic for medication management. She reports taking atorvastatin for 3-4 years and Zetia for about 1 year in the past and experienced myalgias with both. The patient appears to be eating a healthy diet, but minimal exercise due to no sidewalks and COVID precautions. She is very nervous about injections and does not think she can give herself injections.  Patient asked about how long she would need to be on medications for her cholesterol.  Current Medications: None Intolerances: atorvastatin (myalgias), Zetia (myalgias) Risk Factors: CAD/STEMI, resistant HTN, T2DM, HFrEF LDL goal: <55 mg/dL  Diet: breakfast: oatmeal, cheerios. Lunch: Sandwiches, leftover dinner. Dinner: potatoes, vegetables. Snacks: nuts, raisins  Exercise: minimal- walks around house and to mailbox  Family History: The patient's   family history includes Aneurysm in her sister; Cancer in her sister; Heart attack in her father, maternal uncle, maternal uncle, maternal uncle, paternal aunt, paternal aunt, paternal grandfather,  and sister; Heart disease in her maternal uncle, maternal uncle, maternal uncle, paternal aunt, paternal aunt, paternal grandfather, and sister; Stroke in her mother.   Social History: no tobacco, alcohol, or drug use  Labs: Lipid panel (01/30/2019): TC 331, HDL 68, LDL 229, TG 169 (no medications)  Past Medical History:  Diagnosis Date  . Bell's palsy   . Breast cancer ([place]) 1998   Right mastectomy  . Essential hypertension   . GERD (gastroesophageal reflux disease)   . Gout   . History of skin cancer    Squamous cell, left shoulder  . History of stroke   . Mixed hyperlipidemia   . Osteopenia   . Reflux esophagitis   . Thyroid nodule   . Type 2 diabetes mellitus ([place])     Current Outpatient Medications on File Prior to Visit  Medication Sig Dispense Refill  . acetaminophen (TYLENOL) 500 MG tablet Take 500 mg by mouth every 6 (six) hours as needed for headache (pain).    . Alogliptin Benzoate 25 MG TABS Take 12.5 mg by mouth daily at 2 PM.    . aspirin EC 81 MG tablet Take 81 mg by mouth daily.    . Calcium Carbonate-Vitamin D (CALCIUM 500 + D PO) Take 1 tablet by mouth daily.     . candesartan (ATACAND) 32 MG tablet Take 32 mg by mouth daily with breakfast.     . carvedilol (COREG) 25 MG tablet Take  1 tablet (25 mg total) by mouth 2 (two) times daily with a meal. 60 tablet 2  . cholecalciferol (VITAMIN D3) 25 MCG (1000 UT) tablet Take 1,000 Units by mouth daily with breakfast.    . CINNAMON PO Take 1 capsule by mouth daily with supper.    . diazepam (VALIUM) 2 MG tablet Take 2 mg by mouth 2 (two) times daily as needed (vertigo/dizziness).     . isosorbide mononitrate (IMDUR) 30 MG 24 hr tablet TAKE 1 TABLET IN THE EVENING. (Patient taking differently: Take 30 mg by mouth at bedtime. ) 90 tablet 0  . meclizine (ANTIVERT) 25 MG tablet Take 25 mg by mouth 2 (two) times daily as needed for dizziness (vertigo).     . Multiple Vitamin (MULTIVITAMIN WITH MINERALS) TABS tablet Take 1  tablet by mouth every morning.    . multivitamin-lutein (OCUVITE-LUTEIN) CAPS capsule Take 1 capsule by mouth every morning.     . nitroGLYCERIN (NITROSTAT) 0.4 MG SL tablet Place 1 tablet (0.4 mg total) under the tongue every 5 (five) minutes x 3 doses as needed for chest pain. 25 tablet 2  . ticagrelor (BRILINTA) 90 MG TABS tablet Take 1 tablet (90 mg total) by mouth 2 (two) times daily. 180 tablet 2   No current facility-administered medications on file prior to visit.    Allergies  Allergen Reactions  . Bactrim [Sulfamethoxazole-Trimethoprim] Nausea And Vomiting  . Sulfa Antibiotics Nausea And Vomiting  . Amlodipine Other (See Comments)    EDEMA  . Clonidine Derivatives Other (See Comments)    FATIGUE  . Evista [Raloxifene Hcl] Other (See Comments)    GERD  . Fosamax [Alendronate Sodium] Other (See Comments)    INCREASED GERD   . Glipizide Other (See Comments)    PALPITATIONS  . Lipitor [Atorvastatin Calcium] Other (See Comments)    ACHING  . Losartan Other (See Comments)    FATIGUE   . Pravastatin Other (See Comments)    ACHING  . Azithromycin Rash and Other (See Comments)    FACIAL BURNING     Assessment/Plan:  1. Hyperlipidemia - LDL remains above goal of <55 mg/dL. Start taking rosuvastatin 5 mg once daily. Discussed with patient that we will slowly titrate the dose to highest tolerable dose (ideally 20mg  daily). We talked about the benefits of taking a statin long term. Discussed that the cardiovascular benefits are clear for patients less than 89 years old. However if patient has a good quality of life, we would choose to continue to treat her risk factors aggressively. Will recheck lipids once on a stable, max tolerated statin dose. Will readdress PCSK9i at that time. Patient very hesitant. At 21, will need to weigh risk vs benefits. Encouraged the patient to continue eating a healthy diet and walking around her house. Will follow-up in 2-3 weeks to increase dose if  medication is tolerable.    Julieta Bellini, PharmD Candidate  Ramond Dial, [place].D, BCPS, CPP [place]  [address], [place], [place] [address]  Phone: [phone]; Fax: [phone]

## 2019-02-21 ENCOUNTER — Telehealth (HOSPITAL_COMMUNITY): Payer: Self-pay | Admitting: *Deleted

## 2019-02-21 ENCOUNTER — Other Ambulatory Visit: Payer: Self-pay

## 2019-02-21 ENCOUNTER — Ambulatory Visit (INDEPENDENT_AMBULATORY_CARE_PROVIDER_SITE_OTHER): Payer: Medicare Other | Admitting: Pharmacist

## 2019-02-21 ENCOUNTER — Other Ambulatory Visit: Payer: Medicare Other | Admitting: *Deleted

## 2019-02-21 ENCOUNTER — Encounter: Payer: Self-pay | Admitting: Pharmacist

## 2019-02-21 DIAGNOSIS — E785 Hyperlipidemia, unspecified: Secondary | ICD-10-CM

## 2019-02-21 DIAGNOSIS — E875 Hyperkalemia: Secondary | ICD-10-CM

## 2019-02-21 DIAGNOSIS — I200 Unstable angina: Secondary | ICD-10-CM | POA: Diagnosis not present

## 2019-02-21 MED ORDER — ROSUVASTATIN CALCIUM 5 MG PO TABS: 5.0000 mg | ORAL_TABLET | Freq: Every day | ORAL | 3 refills | Status: DC

## 2019-02-21 NOTE — Telephone Encounter (Signed)
Called patient to discuss program. No answer. Left message for her to call us back.

## 2019-02-21 NOTE — Patient Instructions (Signed)
It was nice meeting you today!!  Please start taking rosuvastatin 5 mg once daily.  We will call you in 2-3 weeks for follow-up and may increase the dose of your medication.   Please call [phone] if you have any questions.

## 2019-02-22 ENCOUNTER — Telehealth: Payer: Self-pay | Admitting: Cardiovascular Disease

## 2019-02-22 ENCOUNTER — Telehealth (HOSPITAL_COMMUNITY): Payer: Self-pay | Admitting: *Deleted

## 2019-02-22 LAB — BASIC METABOLIC PANEL
BUN/Creatinine Ratio: 16 (ref 12–28)
BUN: 20 mg/dL (ref 8–27)
CO2: 21 mmol/L (ref 20–29)
Calcium: 9.7 mg/dL (ref 8.7–10.3)
Chloride: 96 mmol/L (ref 96–106)
Creatinine, Ser: 1.22 mg/dL — ABNORMAL HIGH (ref 0.57–1.00)
GFR calc Af Amer: 48 mL/min/{1.73_m2} — ABNORMAL LOW (ref >59)
GFR calc non Af Amer: 41 mL/min/{1.73_m2} — ABNORMAL LOW (ref >59)
Glucose: 118 mg/dL — ABNORMAL HIGH (ref 65–99)
Potassium: 5.1 mmol/L (ref 3.5–5.2)
Sodium: 131 mmol/L — ABNORMAL LOW (ref 134–144)

## 2019-02-22 NOTE — Telephone Encounter (Signed)
Pt c/o medication issue:  1. Name of Medication: ticagrelor (BRILINTA) 90 MG TABS tablet  2. How are you currently taking this medication (dosage and times per day)? 2x daily with aspirin  3. Are you having a reaction (difficulty breathing--STAT)? no  4. What is your medication issue? Patient saw Ermalinda Barrios on 02/16/19. She states that she was told to start taking Brilinta 2x daily with an aspirin. She forgot to do that this morning and now wants to know what she should do. Please advise.

## 2019-02-22 NOTE — Telephone Encounter (Signed)
Patient called back today. She has decided to wait until we reopen to do the in gym program. She does have a smart phone, however she does not want the virtual program. We will call her when we reopen.

## 2019-02-22 NOTE — Telephone Encounter (Signed)
Called and spoke to patient and instructed her to take her Brilinta and ASA. Made her aware that she can take her PM dose before she goes to bed and resume regular schedule tomorrow. Patient verbalized understanding and thanked me for the call.

## 2019-02-27 ENCOUNTER — Inpatient Hospital Stay (HOSPITAL_COMMUNITY)
Admission: EM | Admit: 2019-02-27 | Discharge: 2019-03-02 | DRG: 291 | Disposition: A | Discharge status: Home / Self Care | Payer: Medicare Other | Attending: Internal Medicine | Admitting: Internal Medicine

## 2019-02-27 ENCOUNTER — Other Ambulatory Visit: Payer: Self-pay

## 2019-02-27 ENCOUNTER — Encounter (HOSPITAL_COMMUNITY): Payer: Self-pay | Admitting: Emergency Medicine

## 2019-02-27 ENCOUNTER — Emergency Department (HOSPITAL_COMMUNITY): Payer: Medicare Other

## 2019-02-27 DIAGNOSIS — I2510 Atherosclerotic heart disease of native coronary artery without angina pectoris: Secondary | ICD-10-CM

## 2019-02-27 DIAGNOSIS — I252 Old myocardial infarction: Secondary | ICD-10-CM | POA: Diagnosis not present

## 2019-02-27 DIAGNOSIS — E876 Hypokalemia: Secondary | ICD-10-CM | POA: Diagnosis not present

## 2019-02-27 DIAGNOSIS — K219 Gastro-esophageal reflux disease without esophagitis: Secondary | ICD-10-CM | POA: Diagnosis present

## 2019-02-27 DIAGNOSIS — I5042 Chronic combined systolic (congestive) and diastolic (congestive) heart failure: Secondary | ICD-10-CM

## 2019-02-27 DIAGNOSIS — Z955 Presence of coronary angioplasty implant and graft: Secondary | ICD-10-CM

## 2019-02-27 DIAGNOSIS — E785 Hyperlipidemia, unspecified: Secondary | ICD-10-CM | POA: Diagnosis present

## 2019-02-27 DIAGNOSIS — N179 Acute kidney failure, unspecified: Secondary | ICD-10-CM | POA: Diagnosis present

## 2019-02-27 DIAGNOSIS — Z8673 Personal history of transient ischemic attack (TIA), and cerebral infarction without residual deficits: Secondary | ICD-10-CM

## 2019-02-27 DIAGNOSIS — Z809 Family history of malignant neoplasm, unspecified: Secondary | ICD-10-CM

## 2019-02-27 DIAGNOSIS — J9601 Acute respiratory failure with hypoxia: Secondary | ICD-10-CM

## 2019-02-27 DIAGNOSIS — E782 Mixed hyperlipidemia: Secondary | ICD-10-CM | POA: Diagnosis not present

## 2019-02-27 DIAGNOSIS — N1832 Chronic kidney disease, stage 3b: Secondary | ICD-10-CM | POA: Diagnosis present

## 2019-02-27 DIAGNOSIS — E1122 Type 2 diabetes mellitus with diabetic chronic kidney disease: Secondary | ICD-10-CM | POA: Diagnosis present

## 2019-02-27 DIAGNOSIS — Z823 Family history of stroke: Secondary | ICD-10-CM | POA: Diagnosis not present

## 2019-02-27 DIAGNOSIS — R0602 Shortness of breath: Secondary | ICD-10-CM

## 2019-02-27 DIAGNOSIS — G510 Bell's palsy: Secondary | ICD-10-CM | POA: Diagnosis present

## 2019-02-27 DIAGNOSIS — Z85828 Personal history of other malignant neoplasm of skin: Secondary | ICD-10-CM

## 2019-02-27 DIAGNOSIS — I25118 Atherosclerotic heart disease of native coronary artery with other forms of angina pectoris: Secondary | ICD-10-CM | POA: Diagnosis not present

## 2019-02-27 DIAGNOSIS — I701 Atherosclerosis of renal artery: Secondary | ICD-10-CM | POA: Diagnosis present

## 2019-02-27 DIAGNOSIS — E871 Hypo-osmolality and hyponatremia: Secondary | ICD-10-CM | POA: Diagnosis present

## 2019-02-27 DIAGNOSIS — I509 Heart failure, unspecified: Secondary | ICD-10-CM

## 2019-02-27 DIAGNOSIS — Z20822 Contact with and (suspected) exposure to covid-19: Secondary | ICD-10-CM | POA: Diagnosis not present

## 2019-02-27 DIAGNOSIS — I25110 Atherosclerotic heart disease of native coronary artery with unstable angina pectoris: Secondary | ICD-10-CM | POA: Diagnosis present

## 2019-02-27 DIAGNOSIS — N183 Chronic kidney disease, stage 3 unspecified: Secondary | ICD-10-CM | POA: Diagnosis not present

## 2019-02-27 DIAGNOSIS — M109 Gout, unspecified: Secondary | ICD-10-CM | POA: Diagnosis present

## 2019-02-27 DIAGNOSIS — I5023 Acute on chronic systolic (congestive) heart failure: Secondary | ICD-10-CM | POA: Diagnosis not present

## 2019-02-27 DIAGNOSIS — I5043 Acute on chronic combined systolic (congestive) and diastolic (congestive) heart failure: Secondary | ICD-10-CM | POA: Diagnosis not present

## 2019-02-27 DIAGNOSIS — I255 Ischemic cardiomyopathy: Secondary | ICD-10-CM | POA: Diagnosis present

## 2019-02-27 DIAGNOSIS — I160 Hypertensive urgency: Secondary | ICD-10-CM | POA: Diagnosis present

## 2019-02-27 DIAGNOSIS — M8580 Other specified disorders of bone density and structure, unspecified site: Secondary | ICD-10-CM | POA: Diagnosis present

## 2019-02-27 DIAGNOSIS — I130 Hypertensive heart and chronic kidney disease with heart failure and stage 1 through stage 4 chronic kidney disease, or unspecified chronic kidney disease: Principal | ICD-10-CM | POA: Diagnosis present

## 2019-02-27 DIAGNOSIS — J810 Acute pulmonary edema: Secondary | ICD-10-CM | POA: Diagnosis not present

## 2019-02-27 DIAGNOSIS — Z9011 Acquired absence of right breast and nipple: Secondary | ICD-10-CM

## 2019-02-27 DIAGNOSIS — Z7982 Long term (current) use of aspirin: Secondary | ICD-10-CM

## 2019-02-27 DIAGNOSIS — I10 Essential (primary) hypertension: Secondary | ICD-10-CM | POA: Diagnosis not present

## 2019-02-27 DIAGNOSIS — R000 Tachycardia, unspecified: Secondary | ICD-10-CM | POA: Diagnosis not present

## 2019-02-27 DIAGNOSIS — N189 Chronic kidney disease, unspecified: Secondary | ICD-10-CM | POA: Diagnosis not present

## 2019-02-27 DIAGNOSIS — Z8249 Family history of ischemic heart disease and other diseases of the circulatory system: Secondary | ICD-10-CM

## 2019-02-27 DIAGNOSIS — Z853 Personal history of malignant neoplasm of breast: Secondary | ICD-10-CM | POA: Diagnosis not present

## 2019-02-27 DIAGNOSIS — D631 Anemia in chronic kidney disease: Secondary | ICD-10-CM | POA: Diagnosis present

## 2019-02-27 DIAGNOSIS — R0789 Other chest pain: Secondary | ICD-10-CM | POA: Diagnosis not present

## 2019-02-27 DIAGNOSIS — Z79899 Other long term (current) drug therapy: Secondary | ICD-10-CM

## 2019-02-27 DIAGNOSIS — E119 Type 2 diabetes mellitus without complications: Secondary | ICD-10-CM

## 2019-02-27 LAB — COMPREHENSIVE METABOLIC PANEL
ALT: 26 U/L (ref 0–44)
AST: 45 U/L — ABNORMAL HIGH (ref 15–41)
Albumin: 3.6 g/dL (ref 3.5–5.0)
Alkaline Phosphatase: 74 U/L (ref 38–126)
Anion gap: 11 (ref 5–15)
BUN: 21 mg/dL (ref 8–23)
CO2: 18 mmol/L — ABNORMAL LOW (ref 22–32)
Calcium: 8.4 mg/dL — ABNORMAL LOW (ref 8.9–10.3)
Chloride: 96 mmol/L — ABNORMAL LOW (ref 98–111)
Creatinine, Ser: 1.8 mg/dL — ABNORMAL HIGH (ref 0.44–1.00)
GFR calc Af Amer: 30 mL/min — ABNORMAL LOW (ref >60)
GFR calc non Af Amer: 26 mL/min — ABNORMAL LOW (ref >60)
Glucose, Bld: 187 mg/dL — ABNORMAL HIGH (ref 70–99)
Potassium: 5.4 mmol/L — ABNORMAL HIGH (ref 3.5–5.1)
Sodium: 125 mmol/L — ABNORMAL LOW (ref 135–145)
Total Bilirubin: 1.2 mg/dL (ref 0.3–1.2)
Total Protein: 6.6 g/dL (ref 6.5–8.1)

## 2019-02-27 LAB — CBC WITH DIFFERENTIAL/PLATELET
Abs Immature Granulocytes: 0.09 10*3/uL — ABNORMAL HIGH (ref 0.00–0.07)
Basophils Absolute: 0 10*3/uL (ref 0.0–0.1)
Basophils Relative: 0 %
Eosinophils Absolute: 0.2 10*3/uL (ref 0.0–0.5)
Eosinophils Relative: 1 %
HCT: 33.4 % — ABNORMAL LOW (ref 36.0–46.0)
Hemoglobin: 10.9 g/dL — ABNORMAL LOW (ref 12.0–15.0)
Immature Granulocytes: 1 %
Lymphocytes Relative: 13 %
Lymphs Abs: 1.6 10*3/uL (ref 0.7–4.0)
MCH: 27.5 pg (ref 26.0–34.0)
MCHC: 32.6 g/dL (ref 30.0–36.0)
MCV: 84.3 fL (ref 80.0–100.0)
Monocytes Absolute: 1.8 10*3/uL — ABNORMAL HIGH (ref 0.1–1.0)
Monocytes Relative: 14 %
Neutro Abs: 9.1 10*3/uL — ABNORMAL HIGH (ref 1.7–7.7)
Neutrophils Relative %: 71 %
Platelets: 303 10*3/uL (ref 150–400)
RBC: 3.96 MIL/uL (ref 3.87–5.11)
RDW: 14.1 % (ref 11.5–15.5)
WBC: 12.8 10*3/uL — ABNORMAL HIGH (ref 4.0–10.5)
nRBC: 0 % (ref 0.0–0.2)

## 2019-02-27 LAB — POC SARS CORONAVIRUS 2 AG -  ED: SARS Coronavirus 2 Ag: NEGATIVE

## 2019-02-27 LAB — PROTIME-INR
INR: 1.2 (ref 0.8–1.2)
Prothrombin Time: 14.9 seconds (ref 11.4–15.2)

## 2019-02-27 LAB — RESPIRATORY PANEL BY RT PCR (FLU A&B, COVID)
Influenza A by PCR: NEGATIVE
Influenza B by PCR: NEGATIVE
SARS Coronavirus 2 by RT PCR: NEGATIVE

## 2019-02-27 LAB — TROPONIN I (HIGH SENSITIVITY)
Troponin I (High Sensitivity): 29 ng/L — ABNORMAL HIGH (ref <18)
Troponin I (High Sensitivity): 189 ng/L (ref <18)

## 2019-02-27 LAB — BRAIN NATRIURETIC PEPTIDE: B Natriuretic Peptide: 2582 pg/mL — ABNORMAL HIGH (ref 0.0–100.0)

## 2019-02-27 MED ORDER — NITROGLYCERIN IN D5W 200-5 MCG/ML-% IV SOLN
INTRAVENOUS | Status: AC
  Filled 2019-02-27: qty 250

## 2019-02-27 MED ORDER — HEPARIN SODIUM (PORCINE) 5000 UNIT/ML IJ SOLN
5000.0000 [IU] | Freq: Three times a day (TID) | INTRAMUSCULAR | Status: DC
  Administered 2019-02-28 – 2019-03-02 (×6): 5000 [IU] via SUBCUTANEOUS
  Filled 2019-02-27 (×7): qty 1

## 2019-02-27 MED ORDER — ACETAMINOPHEN 325 MG PO TABS: 650.0000 mg | ORAL_TABLET | Freq: Four times a day (QID) | ORAL | Status: DC | PRN

## 2019-02-27 MED ORDER — FUROSEMIDE 10 MG/ML IJ SOLN
40.0000 mg | Freq: Once | INTRAMUSCULAR | Status: AC
  Administered 2019-02-27: 40 mg via INTRAVENOUS
  Filled 2019-02-27: qty 4

## 2019-02-27 MED ORDER — SODIUM CHLORIDE 0.9 % IV SOLN: 250.0000 mL | INTRAVENOUS | Status: DC | PRN

## 2019-02-27 MED ORDER — ASPIRIN EC 81 MG PO TBEC
81.0000 mg | DELAYED_RELEASE_TABLET | Freq: Every day | ORAL | Status: DC
  Administered 2019-02-28 – 2019-03-02 (×3): 81 mg via ORAL
  Filled 2019-02-27 (×4): qty 1

## 2019-02-27 MED ORDER — ACETAMINOPHEN 650 MG RE SUPP: 650.0000 mg | Freq: Four times a day (QID) | RECTAL | Status: DC | PRN

## 2019-02-27 MED ORDER — ADULT MULTIVITAMIN W/MINERALS CH
1.0000 | ORAL_TABLET | Freq: Every morning | ORAL | Status: DC
  Administered 2019-02-28 – 2019-03-02 (×3): 1 via ORAL
  Filled 2019-02-27 (×4): qty 1

## 2019-02-27 MED ORDER — LORAZEPAM 2 MG/ML IJ SOLN
0.5000 mg | Freq: Once | INTRAMUSCULAR | Status: AC
  Administered 2019-02-27: 0.5 mg via INTRAVENOUS
  Filled 2019-02-27: qty 1

## 2019-02-27 MED ORDER — CALCIUM CARBONATE-VITAMIN D 500-200 MG-UNIT PO TABS
ORAL_TABLET | Freq: Every day | ORAL | Status: DC
  Administered 2019-02-28 – 2019-03-02 (×3): 1 via ORAL
  Filled 2019-02-27 (×5): qty 1

## 2019-02-27 MED ORDER — BISACODYL 10 MG RE SUPP: 10.0000 mg | Freq: Every day | RECTAL | Status: DC | PRN

## 2019-02-27 MED ORDER — TRAMADOL HCL 50 MG PO TABS: 50.0000 mg | ORAL_TABLET | Freq: Three times a day (TID) | ORAL | Status: DC | PRN

## 2019-02-27 MED ORDER — CARVEDILOL 12.5 MG PO TABS
25.0000 mg | ORAL_TABLET | Freq: Two times a day (BID) | ORAL | Status: DC
  Administered 2019-02-28 – 2019-03-02 (×5): 25 mg via ORAL
  Filled 2019-02-27 (×7): qty 2

## 2019-02-27 MED ORDER — ONDANSETRON HCL 4 MG PO TABS: 4.0000 mg | ORAL_TABLET | Freq: Four times a day (QID) | ORAL | Status: DC | PRN

## 2019-02-27 MED ORDER — POLYETHYLENE GLYCOL 3350 17 G PO PACK: 17.0000 g | PACK | Freq: Every day | ORAL | Status: DC | PRN

## 2019-02-27 MED ORDER — SODIUM CHLORIDE 0.9% FLUSH: 3.0000 mL | INTRAVENOUS | Status: DC | PRN

## 2019-02-27 MED ORDER — NITROGLYCERIN IN D5W 200-5 MCG/ML-% IV SOLN
5.0000 ug/min | INTRAVENOUS | Status: DC
  Administered 2019-02-27: 5 ug/min via INTRAVENOUS

## 2019-02-27 MED ORDER — MECLIZINE HCL 12.5 MG PO TABS: 25.0000 mg | ORAL_TABLET | Freq: Two times a day (BID) | ORAL | Status: DC | PRN

## 2019-02-27 MED ORDER — FUROSEMIDE 10 MG/ML IJ SOLN
40.0000 mg | Freq: Two times a day (BID) | INTRAMUSCULAR | Status: DC
  Administered 2019-02-28 – 2019-03-02 (×5): 40 mg via INTRAVENOUS
  Filled 2019-02-27 (×6): qty 4

## 2019-02-27 MED ORDER — ONDANSETRON HCL 4 MG/2ML IJ SOLN: 4.0000 mg | Freq: Four times a day (QID) | INTRAMUSCULAR | Status: DC | PRN

## 2019-02-27 MED ORDER — SODIUM CHLORIDE 0.9% FLUSH
3.0000 mL | Freq: Two times a day (BID) | INTRAVENOUS | Status: DC
  Administered 2019-02-27 – 2019-03-02 (×4): 3 mL via INTRAVENOUS

## 2019-02-27 MED ORDER — VITAMIN D 25 MCG (1000 UNIT) PO TABS
1000.0000 [IU] | ORAL_TABLET | Freq: Every day | ORAL | Status: DC
  Administered 2019-02-28 – 2019-03-02 (×3): 1000 [IU] via ORAL
  Filled 2019-02-27 (×5): qty 1

## 2019-02-27 MED ORDER — TICAGRELOR 90 MG PO TABS
90.0000 mg | ORAL_TABLET | Freq: Two times a day (BID) | ORAL | Status: DC
  Administered 2019-02-28 – 2019-03-02 (×5): 90 mg via ORAL
  Filled 2019-02-27 (×5): qty 1

## 2019-02-27 MED ORDER — INSULIN ASPART 100 UNIT/ML ~~LOC~~ SOLN
0.0000 [IU] | Freq: Three times a day (TID) | SUBCUTANEOUS | Status: DC
  Administered 2019-02-28 (×2): 1 [IU] via SUBCUTANEOUS
  Administered 2019-03-01: 2 [IU] via SUBCUTANEOUS
  Administered 2019-03-02 (×2): 1 [IU] via SUBCUTANEOUS
  Filled 2019-02-27: qty 1

## 2019-02-27 MED ORDER — ROSUVASTATIN CALCIUM 10 MG PO TABS
5.0000 mg | ORAL_TABLET | Freq: Every day | ORAL | Status: DC
  Administered 2019-02-28 – 2019-03-02 (×3): 5 mg via ORAL
  Filled 2019-02-27 (×5): qty 1

## 2019-02-27 MED ORDER — ASPIRIN 81 MG PO CHEW
324.0000 mg | CHEWABLE_TABLET | Freq: Once | ORAL | Status: AC
  Administered 2019-02-27: 324 mg via ORAL
  Filled 2019-02-27: qty 4

## 2019-02-27 MED ORDER — ALBUTEROL SULFATE (2.5 MG/3ML) 0.083% IN NEBU
5.0000 mg | INHALATION_SOLUTION | Freq: Once | RESPIRATORY_TRACT | Status: AC
  Administered 2019-02-27: 5 mg via RESPIRATORY_TRACT
  Filled 2019-02-27: qty 6

## 2019-02-27 NOTE — H&P (Signed)
History and Physical    Patient Demographics:    Crystal [name] DGL:[id number] DOB: [birth year] DOA: 02/27/2019  PCP: Glenda Chroman, MD  Patient coming from: Home  I have personally briefly reviewed patient's old medical records in [place]  Chief Complaint: Shortness of breath   Assessment & Plan:     Assessment/Plan Principal Problem:   CHF exacerbation ([place]) Active Problems:   Diabetes mellitus ([place])   Hyperlipidemia   CAD (coronary artery disease)     Principal Problem: Acute exacerbation of congestive heart failure with reduced ejection fraction Patient presented with increased shortness of breath, dyspnea on exertion.  Noted to have hypoxemia on presentation.  Patient was placed on BiPAP.  BNP noted to be over 2000.  Chest x-ray shows findings consistent with pulmonary edema. Most recent echocardiogram on 01/31/2019 showed EF of 30 to 35%, severe akinesis of the LV and apical segment.  Case discussed with cardiology fellow on-call who recommended transfer to [facility] for cardiology consult this morning. -We will place on IV Lasix -Monitor input, renal function, daily weights -Cardiology consult on transfer to [facility]  Other Active Problems: Acute hypoxemic respiratory failure Patient noted to have increased shortness of breath and hypoxemia presentation likely secondary to CHF exacerbation.  Was placed on BiPAP. -Continuous pulse oximetry -Continue BiPAP and titrate.  Coronary artery disease: Patient had a recent ST elevation MI and had emergent cardiac which showed severe stenosis in the mid LAD with successful DES to mid LAD.  Mild nonobstructive disease in circumflex and moderate nonobstructive disease in mid RCA noted. -Continue aspirin, Brilinta, statin -Continue Coreg, isosorbide -Hold candesartan due to worsening renal function  Hypertension with hypertensive urgency: -Continue Coreg, isosorbide.  Initially placed on nitroglycerin infusion in the ER with  good improvement in blood pressures.  Diabetes mellitus type 2: -Placed on sliding scale insulin coverage with fingerstick monitoring -monitor a1c  Hyperlipidemia: -Continue Crestor  Hyponatremia: Sodium 125 on presentation. -We will send hyponatremia work-up  Mild acute on chronic renal failure with baseline chronic kidney disease stage III: Creatinine 1.8 on presentation.  Baseline appears to be around 1.2-1.4. May be secondary to recent use of ARB's and underlying CHF -We will keep candesartan on hold -Monitor input output, renal function closely  Chronic normocytic anemia: Hemoglobin 10.9 on presentation, appears to be chronic and stable, possibly anemia of chronic disease. -Monitor CBC  History of right-sided breast cancer status post mastectomy Stable, routine follow-up as outpatient  DVT prophylaxis: Lovenox Code Status:  Full code Family Communication: N/A  Disposition Plan: Admitted as inpatient to [facility] as per cardiology request, they will follow up in consult Consults called: N/A Admission status: Inpatient status    HPI:     HPI: Crystal [name] is a 85 y.o. female with medical history significant of CAD, resistant HTN, T2DM, mixed HLD, HFrEF, left renal artery stenosis, mild CKD, breast cancer s/p R mastectomy, gout, GERD, hx of skin cancer, and osteopenia who presented to the ER with shortness of breath. Patient was noted to be having respiratory distress on presentation to the ER with hypoxemia.  Patient was placed on BiPAP with improvement in symptoms.  EKG was done in the ER which showed biphasic T waves in slight ST elevations in lateral leads.  Case was discussed with cardiology fellow on-call who did not believe the patient was having a recurrence of MI and opined that the ST changes are likely secondary to evolving changes from recent MI.  Did recommend transferring the  patient over to [facility] for further management and will be available  for consult in the morning. ED Course:  Vital Signs reviewed on presentation, significant for temperature 98.1, heart rate 99, respiratory 27, blood pressure 103/88, saturation 96% on 15 L BiPAP. Labs reviewed, significant for sodium 125, potassium 5.4, chloride 96, BUN 21, creatinine 1.8, LFTs within normal limits, BNP 2582, troponin XX 9, WBC count 12.8, hemoglobin 10.9, hematocrit 33, platelets 303. Imaging personally Reviewed, chest x-ray shows diffuse bilateral interstitial shadows consistent with pulmonary edema with probably vascular congestion. EKG personally reviewed, shows EKG shows sinus tachycardia, low voltages, biphasic T waves in lateral leads, mild ST elevations noted lateral leads.    Review of systems:    Review of Systems: As per HPI otherwise 10 point review of systems negative.  All other review of systems is negative except the ones noted above in the HPI.    Past Medical and Surgical History:  Reviewed by me  Past Medical History:  Diagnosis Date  . Bell's palsy   . Breast cancer ([place]) 1998   Right mastectomy  . Essential hypertension   . GERD (gastroesophageal reflux disease)   . Gout   . History of skin cancer    Squamous cell, left shoulder  . History of stroke   . Mixed hyperlipidemia   . Osteopenia   . Reflux esophagitis   . Thyroid nodule   . Type 2 diabetes mellitus ([place])     Past Surgical History:  Procedure Laterality Date  . ABDOMINAL HYSTERECTOMY    . CATARACT EXTRACTION  2016  . CORONARY/GRAFT ACUTE MI REVASCULARIZATION N/A 01/30/2019   Procedure: Coronary/Graft Acute MI Revascularization;  Surgeon: Burnell Blanks, MD;  Location: [facility];  Service: Cardiovascular;  Laterality: N/A;  . LEFT HEART CATH AND CORONARY ANGIOGRAPHY N/A 01/30/2019   Procedure: LEFT HEART CATH AND CORONARY ANGIOGRAPHY;  Surgeon: Burnell Blanks, MD;  Location: [facility];  Service: Cardiovascular;  Laterality: N/A;  . Right  mastectomy  1998   [place]     Social History:  Reviewed by me   reports that she has never smoked. She has never used smokeless tobacco. She reports that she does not drink alcohol or use drugs.  Allergies:    Allergies  Allergen Reactions  . Bactrim [Sulfamethoxazole-Trimethoprim] Nausea And Vomiting  . Sulfa Antibiotics Nausea And Vomiting  . Amlodipine Other (See Comments)    EDEMA  . Clonidine Derivatives Other (See Comments)    FATIGUE  . Evista [Raloxifene Hcl] Other (See Comments)    GERD  . Fosamax [Alendronate Sodium] Other (See Comments)    INCREASED GERD   . Glipizide Other (See Comments)    PALPITATIONS  . Lipitor [Atorvastatin Calcium] Other (See Comments)    ACHING  . Losartan Other (See Comments)    FATIGUE   . Pravastatin Other (See Comments)    ACHING  . Azithromycin Rash and Other (See Comments)    FACIAL BURNING     Family History :   Family History  Problem Relation Age of Onset  . Stroke Mother   . Heart attack Father   . Aneurysm Sister   . Heart attack Maternal Uncle   . Heart disease Maternal Uncle   . Heart attack Paternal Aunt   . Heart disease Paternal Aunt   . Heart attack Paternal Grandfather   . Heart disease Paternal Grandfather   . Heart attack Paternal Aunt   . Heart disease  Paternal Aunt   . Heart attack Maternal Uncle   . Heart disease Maternal Uncle   . Heart attack Maternal Uncle   . Heart disease Maternal Uncle   . Heart attack Sister   . Heart disease Sister   . Cancer Sister    Family history reviewed, noted as above, not pertinent to current presentation.   Home Medications:    Prior to Admission medications   Medication Sig Start Date End Date Taking? Authorizing Provider  acetaminophen (TYLENOL) 500 MG tablet Take 500 mg by mouth every 6 (six) hours as needed for headache (pain).    [provider]  Alogliptin Benzoate 25 MG TABS Take 12.5 mg by mouth daily at 2 PM. 01/18/19   [provider]  aspirin EC 81 MG tablet Take 81 mg by mouth daily.    [provider]  Calcium Carbonate-Vitamin D (CALCIUM 500 + D PO) Take 1 tablet by mouth daily.     [provider]  candesartan (ATACAND) 32 MG tablet Take 32 mg by mouth daily with breakfast.  12/06/18   [provider]  carvedilol (COREG) 25 MG tablet Take 1 tablet (25 mg total) by mouth 2 (two) times daily with a meal. 02/02/19   Cheryln Manly, NP  cholecalciferol (VITAMIN D3) 25 MCG (1000 UT) tablet Take 1,000 Units by mouth daily with breakfast.    [provider]  CINNAMON PO Take 1 capsule by mouth daily with supper.    [provider]  diazepam (VALIUM) 2 MG tablet Take 2 mg by mouth 2 (two) times daily as needed (vertigo/dizziness).  12/02/18   [provider]  isosorbide mononitrate (IMDUR) 30 MG 24 hr tablet TAKE 1 TABLET IN THE EVENING. Patient taking differently: Take 30 mg by mouth at bedtime.  12/06/18   Satira Sark, MD  meclizine (ANTIVERT) 25 MG tablet Take 25 mg by mouth 2 (two) times daily as needed for dizziness (vertigo).  12/02/18   [provider]  Multiple Vitamin (MULTIVITAMIN WITH MINERALS) TABS tablet Take 1 tablet by mouth every morning.    [provider]  multivitamin-lutein (OCUVITE-LUTEIN) CAPS capsule Take 1 capsule by mouth every morning.     [provider]  nitroGLYCERIN (NITROSTAT) 0.4 MG SL tablet Place 1 tablet (0.4 mg total) under the tongue every 5 (five) minutes x 3 doses as needed for chest pain. 02/02/19   Cheryln Manly, NP  rosuvastatin (CRESTOR) 5 MG tablet Take 1 tablet (5 mg total) by mouth daily. 02/21/19   Burnell Blanks, MD  ticagrelor (BRILINTA) 90 MG TABS tablet Take 1 tablet (90 mg total) by mouth 2 (two) times daily. 02/02/19   Cheryln Manly, NP    Physical Exam:    Physical Exam: Vitals:   02/27/19 2045 02/27/19 2050 02/27/19 2055 02/27/19 2100  BP: (!) 195/119 (!) 115/97 (!)  175/93 (!) 188/98  Pulse: 100 (!) 103 (!) 104 (!) 104  Resp: (!) 24 (!) 32 (!) 21 (!) 28  Temp:      TempSrc:      SpO2: 97% 98% 99% 98%  Weight:      Height:        Constitutional: NAD, calm, comfortable Vitals:   02/27/19 2045 02/27/19 2050 02/27/19 2055 02/27/19 2100  BP: (!) 195/119 (!) 115/97 (!) 175/93 (!) 188/98  Pulse: 100 (!) 103 (!) 104 (!) 104  Resp: (!) 24 (!) 32 (!) 21 (!) 28  Temp:  TempSrc:      SpO2: 97% 98% 99% 98%  Weight:      Height:       Eyes: PERRL, lids and conjunctivae normal ENMT: Mucous membranes are moist. Posterior pharynx clear of any exudate or lesions.Normal dentition.  Neck: normal, supple, no masses, no thyromegaly Respiratory: clear to auscultation bilaterally, no wheezing, decreased air entry at both bases, bilateral crepitations. Normal respiratory effort. No accessory muscle use.  Cardiovascular: Regular rate and rhythm, no murmurs / rubs / gallops. No extremity edema. 2+ pedal pulses. No carotid bruits.  Abdomen: no tenderness, no masses palpated. No hepatosplenomegaly. Bowel sounds positive.  Musculoskeletal: no clubbing / cyanosis. No joint deformity upper and lower extremities. Good ROM, no contractures. Normal muscle tone.  Skin: no rashes, lesions, ulcers. No induration Neurologic: CN 2-12 grossly intact. Sensation intact, DTR normal. Strength 5/5 in all 4.  Psychiatric: Normal judgment and insight. Alert and oriented x 3. Normal mood.    Decubitus Ulcers: Not present on admission Catheters and tubes: None  Data Review:    Labs on Admission: I have personally reviewed following labs and imaging studies  CBC: Recent Labs  Lab 02/27/19 1944  WBC 12.8*  NEUTROABS 9.1*  HGB 10.9*  HCT 33.4*  MCV 84.3  PLT 161   Basic Metabolic Panel: Recent Labs  Lab 02/21/19 1314 02/27/19 1944  NA 131* 125*  K 5.1 5.4*  CL 96 96*  CO2 21 18*  GLUCOSE 118* 187*  BUN 20 21  CREATININE 1.22* 1.80*  CALCIUM 9.7 8.4*    GFR: Estimated Creatinine Clearance: 23.4 mL/min (A) (by C-G formula based on SCr of 1.8 mg/dL (H)). Liver Function Tests: Recent Labs  Lab 02/27/19 1944  AST 45*  ALT 26  ALKPHOS 74  BILITOT 1.2  PROT 6.6  ALBUMIN 3.6   No results for input(s): LIPASE, AMYLASE in the last 168 hours. No results for input(s): AMMONIA in the last 168 hours. Coagulation Profile: Recent Labs  Lab 02/27/19 1944  INR 1.2   Cardiac Enzymes: No results for input(s): CKTOTAL, CKMB, CKMBINDEX, TROPONINI in the last 168 hours. BNP (last 3 results) No results for input(s): PROBNP in the last 8760 hours. HbA1C: No results for input(s): HGBA1C in the last 72 hours. CBG: No results for input(s): GLUCAP in the last 168 hours. Lipid Profile: No results for input(s): CHOL, HDL, LDLCALC, TRIG, CHOLHDL, LDLDIRECT in the last 72 hours. Thyroid Function Tests: No results for input(s): TSH, T4TOTAL, FREET4, T3FREE, THYROIDAB in the last 72 hours. Anemia Panel: No results for input(s): VITAMINB12, FOLATE, FERRITIN, TIBC, IRON, RETICCTPCT in the last 72 hours. Urine analysis: No results found for: COLORURINE, APPEARANCEUR, [place], [place], [place], [place], BILIRUBINUR, KETONESUR, PROTEINUR, UROBILINOGEN, NITRITE, LEUKOCYTESUR   Imaging Results:      Radiological Exams on Admission: DG Chest Portable 1 View  Result Date: 02/27/2019 CLINICAL DATA:  Shortness of breath EXAM: PORTABLE CHEST 1 VIEW COMPARISON:  01/30/2019 FINDINGS: Cardiac shadow is within normal limits. Aortic calcifications are again seen. Vascular congestion is noted with evidence of bilateral parenchymal edema. No sizable effusion is seen. No bony abnormality is noted. IMPRESSION: Changes consistent with CHF. Electronically Signed   By: Inez Catalina M.D.   On: 02/27/2019 20:41      Justine Cossin Ginette Otto MD [facility]  If 7PM-7AM, please contact night-coverage   02/27/2019, 9:19 PM

## 2019-02-27 NOTE — ED Notes (Signed)
Date and time results received: 02/27/19 20:30 (use smartphrase ".now" to insert current time)  Test :troponin Critical Value: 189  Name of Provider Notified: [facility]  Orders Received? Or Actions Taken?: physician notified

## 2019-02-27 NOTE — ED Triage Notes (Signed)
Patient complains of Shortness of breath that began yesterday after walking to the mailbox. Patient had a stent place January 3rd.

## 2019-02-27 NOTE — ED Provider Notes (Signed)
[facility] EMERGENCY DEPARTMENT Provider Note   CSN: [id number] Arrival date & time: 02/27/19  1919     History Chief Complaint  Patient presents with  . Shortness of Breath    Crystal [name] is a 85 y.o. female.  Level 5 caveat for respiratory distress.  Patient complains of shortness of breath since yesterday that came on with exertion.  Has been "coming and going since".  She denies any cough.  She has tightness in her chest but not similar to her MI.  She underwent a cardiac catheterization on January 3 for ST elevation MI and had 1 stent placed.  She was found to have an echocardiogram with an EF around 35%.  She states she has been doing well up until yesterday when she became acutely short of breath is progressively worsening and feels like she is being smothered.  He denies any abdominal pain, nausea, vomiting.  No leg swelling.  She states she normally lies flat at home and was not sent home on any diuretics other than Spironolactone  The history is provided by the patient. The history is limited by the condition of the patient.  Shortness of Breath Associated symptoms: cough   Associated symptoms: no abdominal pain, no fever and no vomiting        Past Medical History:  Diagnosis Date  . Bell's palsy   . Breast cancer ([place]) 1998   Right mastectomy  . Essential hypertension   . GERD (gastroesophageal reflux disease)   . Gout   . History of skin cancer    Squamous cell, left shoulder  . History of stroke   . Mixed hyperlipidemia   . Osteopenia   . Reflux esophagitis   . Thyroid nodule   . Type 2 diabetes mellitus [facility])     Patient Active Problem List   Diagnosis Date Noted  . STEMI (ST elevation myocardial infarction) ([place]) 01/30/2019  . NSTEMI (non-ST elevated myocardial infarction) ([place]) 01/30/2019  . Non-ST elevation (NSTEMI) myocardial infarction ([place])   . Renal artery stenosis ([place]) 12/02/2017  . Diabetes mellitus ([place]) 03/04/2011  . Hyperlipidemia  03/04/2011  . Resistant hypertension 03/04/2011    Past Surgical History:  Procedure Laterality Date  . ABDOMINAL HYSTERECTOMY    . CATARACT EXTRACTION  2016  . CORONARY/GRAFT ACUTE MI REVASCULARIZATION N/A 01/30/2019   Procedure: Coronary/Graft Acute MI Revascularization;  Surgeon: Burnell Blanks, MD;  Location: [place] [facility];  Service: Cardiovascular;  Laterality: N/A;  . LEFT HEART CATH AND CORONARY ANGIOGRAPHY N/A 01/30/2019   Procedure: LEFT HEART CATH AND CORONARY ANGIOGRAPHY;  Surgeon: Burnell Blanks, MD;  Location: [place] [facility];  Service: Cardiovascular;  Laterality: N/A;  . Right mastectomy  1998   [place]     OB History   No obstetric history on file.     Family History  Problem Relation Age of Onset  . Stroke Mother   . Heart attack Father   . Aneurysm Sister   . Heart attack Maternal Uncle   . Heart disease Maternal Uncle   . Heart attack Paternal Aunt   . Heart disease Paternal Aunt   . Heart attack Paternal Grandfather   . Heart disease Paternal Grandfather   . Heart attack Paternal Aunt   . Heart disease Paternal Aunt   . Heart attack Maternal Uncle   . Heart disease Maternal Uncle   . Heart attack Maternal Uncle   . Heart disease Maternal Uncle   . Heart attack Sister   .  Heart disease Sister   . Cancer Sister     Social History   Tobacco Use  . Smoking status: Never Smoker  . Smokeless tobacco: Never Used  Substance Use Topics  . Alcohol use: Never  . Drug use: Never    Home Medications Prior to Admission medications   Medication Sig Start Date End Date Taking? Authorizing Provider  acetaminophen (TYLENOL) 500 MG tablet Take 500 mg by mouth every 6 (six) hours as needed for headache (pain).    [provider]  Alogliptin Benzoate 25 MG TABS Take 12.5 mg by mouth daily at 2 PM. 01/18/19   [provider]  aspirin EC 81 MG tablet Take 81 mg by mouth daily.    [provider]  Calcium  Carbonate-Vitamin D (CALCIUM 500 + D PO) Take 1 tablet by mouth daily.     [provider]  candesartan (ATACAND) 32 MG tablet Take 32 mg by mouth daily with breakfast.  12/06/18   [provider]  carvedilol (COREG) 25 MG tablet Take 1 tablet (25 mg total) by mouth 2 (two) times daily with a meal. 02/02/19   Cheryln Manly, NP  cholecalciferol (VITAMIN D3) 25 MCG (1000 UT) tablet Take 1,000 Units by mouth daily with breakfast.    [provider]  CINNAMON PO Take 1 capsule by mouth daily with supper.    [provider]  diazepam (VALIUM) 2 MG tablet Take 2 mg by mouth 2 (two) times daily as needed (vertigo/dizziness).  12/02/18   [provider]  isosorbide mononitrate (IMDUR) 30 MG 24 hr tablet TAKE 1 TABLET IN THE EVENING. Patient taking differently: Take 30 mg by mouth at bedtime.  12/06/18   Satira Sark, MD  meclizine (ANTIVERT) 25 MG tablet Take 25 mg by mouth 2 (two) times daily as needed for dizziness (vertigo).  12/02/18   [provider]  Multiple Vitamin (MULTIVITAMIN WITH MINERALS) TABS tablet Take 1 tablet by mouth every morning.    [provider]  multivitamin-lutein (OCUVITE-LUTEIN) CAPS capsule Take 1 capsule by mouth every morning.     [provider]  nitroGLYCERIN (NITROSTAT) 0.4 MG SL tablet Place 1 tablet (0.4 mg total) under the tongue every 5 (five) minutes x 3 doses as needed for chest pain. 02/02/19   Cheryln Manly, NP  rosuvastatin (CRESTOR) 5 MG tablet Take 1 tablet (5 mg total) by mouth daily. 02/21/19   Burnell Blanks, MD  ticagrelor (BRILINTA) 90 MG TABS tablet Take 1 tablet (90 mg total) by mouth 2 (two) times daily. 02/02/19   Cheryln Manly, NP    Allergies    Bactrim [sulfamethoxazole-trimethoprim], Sulfa antibiotics, Amlodipine, Clonidine derivatives, Evista [raloxifene hcl], Fosamax [alendronate sodium], Glipizide, Lipitor [atorvastatin calcium], Losartan, Pravastatin, and  Azithromycin  Review of Systems   Review of Systems  Unable to perform ROS: Severe respiratory distress  Constitutional: Negative for activity change, appetite change and fever.  Respiratory: Positive for cough, chest tightness and shortness of breath.   Gastrointestinal: Negative for abdominal pain, nausea and vomiting.  Genitourinary: Negative for dysuria and hematuria.   all other systems are negative except as noted in the HPI and PMH.    Physical Exam Updated Vital Signs Pulse (!) 103   Temp 98.1 F (36.7 C) (Oral)   Resp (!) 23   Ht 5\' 5"  (1.651 m)   Wt 68.4 kg   SpO2 93%   BMI 25.09 kg/m   Physical Exam Vitals and nursing  note reviewed.  Constitutional:      General: She is in acute distress.     Appearance: She is well-developed. She is ill-appearing.     Comments: Pale appearing, respiratory distress, tachypneic  HENT:     Head: Normocephalic and atraumatic.     Mouth/Throat:     Pharynx: No oropharyngeal exudate.  Eyes:     Conjunctiva/sclera: Conjunctivae normal.     Pupils: Pupils are equal, round, and reactive to light.  Neck:     Comments: No meningismus. Cardiovascular:     Rate and Rhythm: Normal rate and regular rhythm.     Heart sounds: Normal heart sounds. No murmur.  Pulmonary:     Effort: Pulmonary effort is normal. No respiratory distress.     Breath sounds: Rales present.     Comments: Bibasilar crackles Abdominal:     Palpations: Abdomen is soft.     Tenderness: There is no abdominal tenderness. There is no guarding or rebound.  Musculoskeletal:        General: No tenderness. Normal range of motion.     Cervical back: Normal range of motion and neck supple.     Right lower leg: Edema present.     Left lower leg: Edema present.  Skin:    General: Skin is warm.  Neurological:     Mental Status: She is alert and oriented to person, place, and time.     Cranial Nerves: No cranial nerve deficit.     Motor: No abnormal muscle tone.      Coordination: Coordination normal.     Comments:  5/5 strength throughout. CN 2-12 intact.Equal grip strength.   Psychiatric:        Behavior: Behavior normal.     ED Results / Procedures / Treatments   Labs (all labs ordered are listed, but only abnormal results are displayed) Labs Reviewed  CBC WITH DIFFERENTIAL/PLATELET - Abnormal; Notable for the following components:      Result Value   WBC 12.8 (*)    Hemoglobin 10.9 (*)    HCT 33.4 (*)    Neutro Abs 9.1 (*)    Monocytes Absolute 1.8 (*)    Abs Immature Granulocytes 0.09 (*)    All other components within normal limits  COMPREHENSIVE METABOLIC PANEL - Abnormal; Notable for the following components:   Sodium 125 (*)    Potassium 5.4 (*)    Chloride 96 (*)    CO2 18 (*)    Glucose, Bld 187 (*)    Creatinine, Ser 1.80 (*)    Calcium 8.4 (*)    AST 45 (*)    GFR calc non Af Amer 26 (*)    GFR calc Af Amer 30 (*)    All other components within normal limits  BRAIN NATRIURETIC PEPTIDE - Abnormal; Notable for the following components:   B Natriuretic Peptide 2,582.0 (*)    All other components within normal limits  TROPONIN I (HIGH SENSITIVITY) - Abnormal; Notable for the following components:   Troponin I (High Sensitivity) 29 (*)    All other components within normal limits  TROPONIN I (HIGH SENSITIVITY) - Abnormal; Notable for the following components:   Troponin I (High Sensitivity) 189 (*)    All other components within normal limits  RESPIRATORY PANEL BY RT PCR (FLU A&B, COVID)  PROTIME-INR  BASIC METABOLIC PANEL  CBC  POC SARS CORONAVIRUS 2 AG -  ED    EKG EKG Interpretation  Date/Time:  Sunday February 27 2019  19:41:29 EST Ventricular Rate:  104 PR Interval:    QRS Duration: 111 QT Interval:  319 QTC Calculation: 420 R Axis:   83 Text Interpretation: Sinus tachycardia Low voltage with right axis deviation Nonspecific T abnrm, anterolateral leads similar to earlier today ST elevation and biphasic T wave  inversions laterally. d/w Dr. Ellyn Hack Confirmed by Ezequiel Essex [phone]) on 02/27/2019 8:25:50 PM   Radiology DG Chest Portable 1 View  Result Date: 02/27/2019 CLINICAL DATA:  Shortness of breath EXAM: PORTABLE CHEST 1 VIEW COMPARISON:  01/30/2019 FINDINGS: Cardiac shadow is within normal limits. Aortic calcifications are again seen. Vascular congestion is noted with evidence of bilateral parenchymal edema. No sizable effusion is seen. No bony abnormality is noted. IMPRESSION: Changes consistent with CHF. Electronically Signed   By: Inez Catalina M.D.   On: 02/27/2019 20:41    Procedures .Critical Care Performed by: Ezequiel Essex, MD Authorized by: Ezequiel Essex, MD   Critical care provider statement:    Critical care time (minutes):  45   Critical care was necessary to treat or prevent imminent or life-threatening deterioration of the following conditions:  Cardiac failure and respiratory failure   Critical care was time spent personally by me on the following activities:  Discussions with consultants, evaluation of patient's response to treatment, examination of patient, ordering and performing treatments and interventions, ordering and review of laboratory studies, ordering and review of radiographic studies, pulse oximetry, re-evaluation of patient's condition, obtaining history from patient or surrogate and review of old charts   (including critical care time)  Medications Ordered in ED Medications  albuterol (PROVENTIL) (2.5 MG/3ML) 0.083% nebulizer solution 5 mg (has no administration in time range)  aspirin chewable tablet 324 mg (has no administration in time range)  furosemide (LASIX) injection 40 mg (has no administration in time range)    ED Course  I have reviewed the triage vital signs and the nursing notes.  Pertinent labs & imaging results that were available during my care of the patient were reviewed by me and considered in my medical decision making (see chart  for details).    MDM Rules/Calculators/A&P                     Patient with recent ST elevation MI here with shortness of breath for the past 2 days.  She is hypoxic into the 70s on room air with increased work of breathing with tachypnea and crackles.  EKG shows biphasic T waves in the lateral leads with some elevation of V4, V5 and V6.  Patient placed on BiPAP on arrival and given IV Lasix.  EKG discussed with Dr. Hassell Done of cardiology who does not feel this represents STEMI but recommends discussion with interventionalist.  Discussed with Dr. Ellyn Hack who reviewed EKG.  He feels these EKG changes likely evolution of her previous anterior lateral MI.  He does not think she has in-stent thrombosis given her lack of chest pain currently. Agrees does not meet STEMI criteria.   Patient improving on BiPAP.  Her x-ray is consistent with pulmonary edema.  She started on IV nitroglycerin as well as IV Lasix.  She remains tachypneic but with her breathing is improving.  Her diaphoresis is improving.  Labs show mild hyponatremia and renal failure. Discussed again with Dr. Hassell Done of cardiology.  She request medical admission and they will consult on patient [facility] at [facility].  Discussed with Dr. Scherrie November.   [name] was evaluated in Emergency Department on 02/27/2019 for  the symptoms described in the history of present illness. She was evaluated in the context of the global COVID-19 pandemic, which necessitated consideration that the patient might be at risk for infection with the SARS-CoV-2 virus that causes COVID-19. Institutional protocols and algorithms that pertain to the evaluation of patients at risk for COVID-19 are in a state of rapid change based on information released by regulatory bodies including the CDC and federal and state organizations. These policies and algorithms were followed during the patient's care in the ED.  Final Clinical Impression(s) / ED Diagnoses Final  diagnoses:  Acute pulmonary edema [facility])    Rx / DC Orders ED Discharge Orders    None       Taesean Reth, Annie Main, MD 02/27/19 2327

## 2019-02-28 ENCOUNTER — Inpatient Hospital Stay (HOSPITAL_COMMUNITY): Payer: Medicare Other

## 2019-02-28 ENCOUNTER — Other Ambulatory Visit: Payer: Self-pay

## 2019-02-28 DIAGNOSIS — N189 Chronic kidney disease, unspecified: Secondary | ICD-10-CM

## 2019-02-28 DIAGNOSIS — I5042 Chronic combined systolic (congestive) and diastolic (congestive) heart failure: Secondary | ICD-10-CM

## 2019-02-28 DIAGNOSIS — I25118 Atherosclerotic heart disease of native coronary artery with other forms of angina pectoris: Secondary | ICD-10-CM

## 2019-02-28 DIAGNOSIS — N179 Acute kidney failure, unspecified: Secondary | ICD-10-CM | POA: Insufficient documentation

## 2019-02-28 DIAGNOSIS — E785 Hyperlipidemia, unspecified: Secondary | ICD-10-CM | POA: Insufficient documentation

## 2019-02-28 DIAGNOSIS — E782 Mixed hyperlipidemia: Secondary | ICD-10-CM

## 2019-02-28 DIAGNOSIS — J810 Acute pulmonary edema: Secondary | ICD-10-CM | POA: Insufficient documentation

## 2019-02-28 DIAGNOSIS — J9601 Acute respiratory failure with hypoxia: Secondary | ICD-10-CM

## 2019-02-28 DIAGNOSIS — I10 Essential (primary) hypertension: Secondary | ICD-10-CM

## 2019-02-28 DIAGNOSIS — I5043 Acute on chronic combined systolic (congestive) and diastolic (congestive) heart failure: Secondary | ICD-10-CM

## 2019-02-28 LAB — CBC
HCT: 34.1 % — ABNORMAL LOW (ref 36.0–46.0)
Hemoglobin: 11.1 g/dL — ABNORMAL LOW (ref 12.0–15.0)
MCH: 27.5 pg (ref 26.0–34.0)
MCHC: 32.6 g/dL (ref 30.0–36.0)
MCV: 84.6 fL (ref 80.0–100.0)
Platelets: 258 10*3/uL (ref 150–400)
RBC: 4.03 MIL/uL (ref 3.87–5.11)
RDW: 13.9 % (ref 11.5–15.5)
WBC: 11.3 10*3/uL — ABNORMAL HIGH (ref 4.0–10.5)
nRBC: 0 % (ref 0.0–0.2)

## 2019-02-28 LAB — BASIC METABOLIC PANEL
Anion gap: 12 (ref 5–15)
BUN: 25 mg/dL — ABNORMAL HIGH (ref 8–23)
CO2: 23 mmol/L (ref 22–32)
Calcium: 8.5 mg/dL — ABNORMAL LOW (ref 8.9–10.3)
Chloride: 93 mmol/L — ABNORMAL LOW (ref 98–111)
Creatinine, Ser: 1.63 mg/dL — ABNORMAL HIGH (ref 0.44–1.00)
GFR calc Af Amer: 34 mL/min — ABNORMAL LOW (ref >60)
GFR calc non Af Amer: 29 mL/min — ABNORMAL LOW (ref >60)
Glucose, Bld: 156 mg/dL — ABNORMAL HIGH (ref 70–99)
Potassium: 3.7 mmol/L (ref 3.5–5.1)
Sodium: 128 mmol/L — ABNORMAL LOW (ref 135–145)

## 2019-02-28 LAB — CBG MONITORING, ED
Glucose-Capillary: 112 mg/dL — ABNORMAL HIGH (ref 70–99)
Glucose-Capillary: 126 mg/dL — ABNORMAL HIGH (ref 70–99)

## 2019-02-28 LAB — GLUCOSE, CAPILLARY
Glucose-Capillary: 135 mg/dL — ABNORMAL HIGH (ref 70–99)
Glucose-Capillary: 112 mg/dL — ABNORMAL HIGH (ref 70–99)

## 2019-02-28 MED ORDER — ISOSORBIDE MONONITRATE ER 60 MG PO TB24
30.0000 mg | ORAL_TABLET | Freq: Every day | ORAL | Status: DC
  Administered 2019-02-28 – 2019-03-02 (×3): 30 mg via ORAL
  Filled 2019-02-28 (×5): qty 1

## 2019-02-28 NOTE — Progress Notes (Addendum)
PROGRESS NOTE  Prosperity Darrough TFT:[id number] DOB: [birth year] DOA: 02/27/2019 PCP: Glenda Chroman, MD  Brief History:  85 year old female with a history of coronary artery disease, ischemic cardiomyopathy with a recent STEMI 01/30/2019 status post heart catheterization with DES, diabetes mellitus type 2, hypertension, left renal artery stenosis, right breast cancer, and GERD presenting with dyspnea on exertion for approximately 1 week.  She stated that she had had intermittent chest discomfort with the dyspnea on exertion.   She stated that her shortness of breath gradually worsened over the past week to the point where she had difficulty catching her breath after a bath on 02/27/2019.  She denied any worsening orthopnea or lower extremity edema.  As result, she presented for further evaluation.  Upon presentation, she was noted to be in respiratory distress with oxygen saturation in the 70s, and she was  placed on BiPAP.  Notably, the patient had a recent admission to the hospital from 01/30/2019 to 02/02/2019 during which time she had an STEMI.  She underwent cardiac catheterization on 02/26/2019 which showed severe mid LAD stenosis status post DES x1.  She was started on aspirin and Brilinta.  She was sent home on carvedilol 25 mg twice daily and spironolactone.  01/31/2019 echo showed EF 30-35%. In the emergency department, the patient was placed on BiPAP.  Chest x-ray showed pulmonary edema.  She was given IV furosemide and started on nitroglycerin drip.  Case was discussed with cardiology fellow and subsequently with Dr. Ellyn Hack.  Dr. Ellyn Hack to opined that that ST changes are likely secondary to evolving changes from her recent MI and did not feel the patient was having a STEMI.  They recommended transferring the patient to [facility] for further management.  Assessment/Plan: Acute respiratory failure with hypoxia -Secondary to pulmonary edema -Initially placed on BiPAP -Currently stable on 2 L  nasal cannula -Wean oxygen as tolerated for saturation greater 92% -personally reviewed CXR--pulmonary edema  Acute on chronic systolic and diastolic CHF -7/0/6237 echo EF 30-35%, G2DD, + regional WMA, severe apical AK -Continue furosemide 40 mg IV twice daily -Restart Imdur -Wean off nitroglycerin drip -Continue carvedilol -Daily weights -Accurate I's and O's -Holding ARB secondary to worsening serum creatinine  Acute on chronic renal failure--CKD stage IIIb -Baseline creatinine 1.2-1.4 -Presented with serum creatinine 1.80 -Monitor with diuresis  Coronary artery disease -Continue aspirin and Brilinta -No chest pain presently -Continue Crestor -personally reviewed EKG--sinus, TWI V4-V6  Hyponatremia -Secondary to fluid overload -Improving with diuresis  Diabetes mellitus type 2 -Hemoglobin A1c -Holding alogliptin -NovoLog sliding scale pending A1c  Essential hypertension -Continue carvedilol -Holding ARB secondary to acute on chronic renal failure  Hyperlipidemia -previously statin intolerant--now tolerating low dose crestor -Continue statin  -01/30/19 LDL 229       Disposition Plan:   Transfer to [facility] Family Communication:   Family at bedside  Consultants:  cardiology  Code Status:  FULL   DVT Prophylaxis:  [place] Heparin    Procedures: As Listed in Progress Note Above  Antibiotics: None       Subjective: Patient denies fevers, chills, headache, chest pain, dyspnea, nausea, vomiting, diarrhea, abdominal pain, dysuria, hematuria, hematochezia, and melena.   Objective: Vitals:   02/28/19 0500 02/28/19 0600 02/28/19 0615 02/28/19 0700  BP: 133/64 (!) 149/67  (!) 143/61  Pulse: 63 64  69  Resp: (!) 23 (!) 28  15  Temp:      TempSrc:  SpO2: 100% 100% 99% 99%  Weight:      Height:        Intake/Output Summary (Last 24 hours) at 02/28/2019 0723 Last data filed at 02/28/2019 0700 Gross per 24 hour  Intake --  Output 1600 ml  Net -1600 ml    Weight change:  Exam:   General:  Pt is alert, follows commands appropriately, not in acute distress  HEENT: No icterus, No thrush, No neck mass, [place]/AT  Cardiovascular: RRR, S1/S2, no rubs, no gallops  Respiratory: bibasilar crackles  Abdomen: Soft/+BS, non tender, non distended, no guarding  Extremities: No edema, No lymphangitis, No petechiae, No rashes, no synovitis   Data Reviewed: I have personally reviewed following labs and imaging studies Basic Metabolic Panel: Recent Labs  Lab 02/21/19 1314 02/27/19 1944 02/28/19 0532  NA 131* 125* 128*  K 5.1 5.4* 3.7  CL 96 96* 93*  CO2 21 18* 23  GLUCOSE 118* 187* 156*  BUN 20 21 25*  CREATININE 1.22* 1.80* 1.63*  CALCIUM 9.7 8.4* 8.5*   Liver Function Tests: Recent Labs  Lab 02/27/19 1944  AST 45*  ALT 26  ALKPHOS 74  BILITOT 1.2  PROT 6.6  ALBUMIN 3.6   No results for input(s): LIPASE, AMYLASE in the last 168 hours. No results for input(s): AMMONIA in the last 168 hours. Coagulation Profile: Recent Labs  Lab 02/27/19 1944  INR 1.2   CBC: Recent Labs  Lab 02/27/19 1944 02/28/19 0532  WBC 12.8* 11.3*  NEUTROABS 9.1*  --   HGB 10.9* 11.1*  HCT 33.4* 34.1*  MCV 84.3 84.6  PLT 303 258   Cardiac Enzymes: No results for input(s): CKTOTAL, CKMB, CKMBINDEX, TROPONINI in the last 168 hours. BNP: Invalid input(s): POCBNP CBG: No results for input(s): GLUCAP in the last 168 hours. HbA1C: No results for input(s): HGBA1C in the last 72 hours. Urine analysis: No results found for: COLORURINE, APPEARANCEUR, LABSPEC, PHURINE, GLUCOSEU, HGBUR, BILIRUBINUR, KETONESUR, PROTEINUR, UROBILINOGEN, NITRITE, LEUKOCYTESUR Sepsis Labs: @LABRCNTIP (procalcitonin:4,lacticidven:4) ) Recent Results (from the past 240 hour(s))  Respiratory Panel by RT PCR (Flu A&B, Covid) - Nasopharyngeal Swab     Status: None   Collection Time: 02/27/19  7:49 PM   Specimen: Nasopharyngeal Swab  Result Value Ref Range Status   SARS  Coronavirus 2 by RT PCR NEGATIVE NEGATIVE Final    Comment: (NOTE) SARS-CoV-2 target nucleic acids are NOT DETECTED. The SARS-CoV-2 RNA is generally detectable in upper respiratoy specimens during the acute phase of infection. The lowest concentration of SARS-CoV-2 viral copies this assay can detect is 131 copies/mL. A negative result does not preclude SARS-Cov-2 infection and should not be used as the sole basis for treatment or other patient management decisions. A negative result may occur with  improper specimen collection/handling, submission of specimen other than nasopharyngeal swab, presence of viral mutation(s) within the areas targeted by this assay, and inadequate number of viral copies (<131 copies/mL). A negative result must be combined with clinical observations, patient history, and epidemiological information. The expected result is Negative. Fact Sheet for Patients:  PinkCheek.be Fact Sheet for Healthcare Providers:  GravelBags.it This test is not yet ap proved or cleared by the [place] FDA and  has been authorized for detection and/or diagnosis of SARS-CoV-2 by FDA under an Emergency Use Authorization (EUA). This EUA will remain  in effect (meaning this test can be used) for the duration of the COVID-19 declaration under Section 564(b)(1) of the Act, 21 U.S.C. section 360bbb-3(b)(1), unless the authorization  is terminated or revoked sooner.    Influenza A by PCR NEGATIVE NEGATIVE Final   Influenza B by PCR NEGATIVE NEGATIVE Final    Comment: (NOTE) The Xpert Xpress SARS-CoV-2/FLU/RSV assay is intended as an aid in  the diagnosis of influenza from Nasopharyngeal swab specimens and  should not be used as a sole basis for treatment. Nasal washings and  aspirates are unacceptable for Xpert Xpress SARS-CoV-2/FLU/RSV  testing. Fact Sheet for Patients: PinkCheek.be Fact Sheet  for Healthcare Providers: GravelBags.it This test is not yet approved or cleared by the [place] FDA and  has been authorized for detection and/or diagnosis of SARS-CoV-2 by  FDA under an Emergency Use Authorization (EUA). This EUA will remain  in effect (meaning this test can be used) for the duration of the  Covid-19 declaration under Section 564(b)(1) of the Act, 21  U.S.C. section 360bbb-3(b)(1), unless the authorization is  terminated or revoked. Performed at [facility], [address] [place], [place] [address]      Scheduled Meds: . aspirin EC  81 mg Oral Daily  . Calcium Carb-Cholecalciferol   Oral Daily  . carvedilol  25 mg Oral BID WC  . furosemide  40 mg Intravenous Q12H  . heparin  5,000 Units Subcutaneous Q8H  . insulin aspart  0-9 Units Subcutaneous TID WC  . multivitamin with minerals  1 tablet Oral q morning - 10a  . rosuvastatin  5 mg Oral Daily  . sodium chloride flush  3 mL Intravenous Q12H  . ticagrelor  90 mg Oral BID  . cholecalciferol  1,000 Units Oral Q breakfast   Continuous Infusions: . sodium chloride    . nitroGLYCERIN 25 mcg/min (02/28/19 0541)    Procedures/Studies: CARDIAC CATHETERIZATION  Result Date: 01/30/2019  Prox RCA lesion is 60% stenosed.  Mid RCA lesion is 40% stenosed.  Prox Cx to Mid Cx lesion is 20% stenosed.  Mid LAD lesion is 99% stenosed.  A drug-eluting stent was successfully placed using a SYNERGY XD [id number].  Post intervention, there is a 0% residual stenosis.  1. Severe stenosis mid LAD 2. Successful PTCA/DES x 1 mid LAD 3. Mild non-obstructive disease in the Circumflex 4. Moderate non-obstructive disease in the mid RCA Recommendations: Will admit to the ICU. DAPT with ASA and Brilinta for one year. Continue home beta blocker. Echo in am. She is statin intolerant. Fast track discharge, possibly home tomorrow if stable.   DG Chest Portable 1 View  Result Date: 02/27/2019 CLINICAL DATA:   Shortness of breath EXAM: PORTABLE CHEST 1 VIEW COMPARISON:  01/30/2019 FINDINGS: Cardiac shadow is within normal limits. Aortic calcifications are again seen. Vascular congestion is noted with evidence of bilateral parenchymal edema. No sizable effusion is seen. No bony abnormality is noted. IMPRESSION: Changes consistent with CHF. Electronically Signed   By: Inez Catalina M.D.   On: 02/27/2019 20:41   DG Chest Port 1 View  Result Date: 01/30/2019 CLINICAL DATA:  Chest pain EXAM: PORTABLE CHEST 1 VIEW COMPARISON:  None. FINDINGS: The heart size and mediastinal contours are within normal limits. Both lungs are clear. The visualized skeletal structures are unremarkable. IMPRESSION: No active disease. Electronically Signed   By: Kerby Moors M.D.   On: 01/30/2019 17:57   ECHOCARDIOGRAM COMPLETE  Result Date: 01/31/2019   ECHOCARDIOGRAM REPORT   Patient Name:   MISK GALENTINE Date of Exam: 01/31/2019 Medical Rec #:  [id number]        Height:       65.0 in  Accession #:    [id number]       Weight:       140.0 lb Date of Birth:  [birth year]        BSA:          1.70 m Patient Age:    20 years         BP:           107/72 mmHg Patient Gender: F                HR:           73 bpm. Exam Location:  Inpatient Procedure: 2D Echo, Color Doppler and Cardiac Doppler Indications:    I25.110 Atherosclerotic heart disease of native coronary artery                 with unstable angina pectoris  History:        Patient has no prior history of Echocardiogram examinations.                 Acute MI; Risk Factors:Hypertension, Diabetes and Dyslipidemia.                 Patient is post stent placement.  Sonographer:    Roseanna Rainbow RDCS Referring Phys: [place]  Sonographer Comments: Image acquisition challenging due to respiratory motion. IMPRESSIONS  1. Left ventricular ejection fraction, by visual estimation, is 30 to 35%. The left ventricle has moderately decreased function. There is no left ventricular  hypertrophy.  2. Severe akinesis of the left ventricular, apical apical segment.  3. Left ventricular diastolic parameters are consistent with Grade II diastolic dysfunction (pseudonormalization).  4. The left ventricle demonstrates regional wall motion abnormalities.  5. Global right ventricle has normal systolic function.The right ventricular size is normal. No increase in right ventricular wall thickness.  6. Left atrial size was normal.  7. Right atrial size was normal.  8. The mitral valve is grossly normal. Mild mitral valve regurgitation.  9. The tricuspid valve is normal in structure. 10. The aortic valve is tricuspid. Aortic valve regurgitation is trivial. No evidence of aortic valve sclerosis or stenosis. 11. The pulmonic valve was normal in structure. Pulmonic valve regurgitation is not visualized. 12. The atrial septum is grossly normal. FINDINGS  Left Ventricle: Left ventricular ejection fraction, by visual estimation, is 30 to 35%. The left ventricle has moderately decreased function. Severe akinesis of the left ventricular, apical apical segment. The left ventricle demonstrates regional wall motion abnormalities. There is no left ventricular hypertrophy. Left ventricular diastolic parameters are consistent with Grade II diastolic dysfunction (pseudonormalization). Right Ventricle: The right ventricular size is normal. No increase in right ventricular wall thickness. Global RV systolic function is has normal systolic function. Left Atrium: Left atrial size was normal in size. Right Atrium: Right atrial size was normal in size Pericardium: There is no evidence of pericardial effusion. Mitral Valve: The mitral valve is grossly normal. Mild mitral valve regurgitation. Tricuspid Valve: The tricuspid valve is normal in structure. Tricuspid valve regurgitation is mild. Aortic Valve: The aortic valve is tricuspid. . There is mild thickening of the aortic valve. Aortic valve regurgitation is trivial. The  aortic valve is structurally normal, with no evidence of sclerosis or stenosis. There is mild thickening of the aortic  valve. Pulmonic Valve: The pulmonic valve was normal in structure. Pulmonic valve regurgitation is not visualized. Pulmonic regurgitation is not visualized. Aorta: The aortic root and ascending aorta are structurally normal, with  no evidence of dilitation. IAS/Shunts: The atrial septum is grossly normal.  LEFT VENTRICLE PLAX 2D LVIDd:         4.21 cm       Diastology LVIDs:         2.68 cm       LV e' lateral:   4.03 cm/s LV PW:         1.30 cm       LV E/e' lateral: 30.8 LV IVS:        1.40 cm       LV e' medial:    4.13 cm/s LVOT diam:     1.80 cm       LV E/e' medial:  30.0 LV SV:         53 ml LV SV Index:   30.73 LVOT Area:     2.54 cm  LV Volumes (MOD) LV area d, A2C:    26.90 cm LV area d, A4C:    25.80 cm LV area s, A2C:    19.00 cm LV area s, A4C:    21.40 cm LV major d, A2C:   8.06 cm LV major d, A4C:   7.92 cm LV major s, A2C:   8.10 cm LV major s, A4C:   8.07 cm LV vol d, MOD A2C: 73.3 ml LV vol d, MOD A4C: 69.2 ml LV vol s, MOD A2C: 37.3 ml LV vol s, MOD A4C: 45.4 ml LV SV MOD A2C:     36.0 ml LV SV MOD A4C:     69.2 ml LV SV MOD BP:      31.4 ml RIGHT VENTRICLE             IVC RV S prime:     10.40 cm/s  IVC diam: 1.60 cm TAPSE (M-mode): 2.0 cm LEFT ATRIUM             Index       RIGHT ATRIUM           Index LA diam:        2.40 cm 1.41 cm/m  RA Area:     13.00 cm LA Vol (A2C):   44.2 ml 26.00 ml/m RA Volume:   31.50 ml  18.53 ml/m LA Vol (A4C):   36.2 ml 21.30 ml/m LA Biplane Vol: 40.3 ml 23.71 ml/m  AORTIC VALVE             PULMONIC VALVE LVOT Vmax:   106.00 cm/s PR End Diast Vel: 1.71 msec LVOT Vmean:  69.600 cm/s LVOT VTI:    0.225 m  AORTA Ao Root diam: 2.85 cm Ao Asc diam:  2.80 cm MITRAL VALVE MV Area (PHT): 2.60 cm              SHUNTS MV PHT:        84.68 msec            Systemic VTI:  0.22 m MV Decel Time: 292 msec              Systemic Diam: 1.80 cm MV E  velocity: 124.00 cm/s 103 cm/s MV A velocity: 137.00 cm/s 70.3 cm/s MV E/A ratio:  0.91        1.5  Mertie Moores MD Electronically signed by Mertie Moores MD Signature Date/Time: 01/31/2019/1:46:41 PM    Final     Orson Eva, DO  [facility] Pager [phone]  If 7PM-7AM, please contact night-coverage [email or web address] Password [facility] 02/28/2019, 7:23 AM  LOS: 1 day

## 2019-02-28 NOTE — Final Consult Note (Addendum)
Cardiology Consultation:   [name] ID: [place] [name] MRN: [id number]; DOB: [birth year]  Admit date: 02/27/2019 Date of Consult: 02/28/2019  Primary Care Provider: Glenda Chroman, MD Primary Cardiologist: Lauree Chandler, MD   Primary Electrophysiologist:  None     [name] Profile:   C[place] [name] is a 85 y.o. female with a hx of CAD status post STEMI treated with DES to [place] LAD 01/2019 LVEF 30 to 35% who is being seen today for Crystal evaluation of acute CHF at Crystal request of Dr. Carles Collet.  History of Present Illness:   Crystal [name] is an 85 year old female with a history of DM,resistant HTN, left renal artery stenosis, mild CKD, and breast cancer s/p R mastectomy.    [name] had STEMI  DES LAD 01/2019 Residual non obstructive disease in [place] Lcx and mRCA to be treated medically. Placed on DAPT with ASA/Brilinta for at least one year. hsTn peaked at 6918.  2D echo showed LVEF 30 to 35% with severe akinesis of Crystal left ventricle and apical segments with grade 2 DD.  Diltiazem was stopped and she was placed on Coreg.  spironolactone was restarted.  History of statin intolerance and referred to [place] lipid clinic.  LDL was 229.   I saw Crystal [name] 02/17/2019 and she was short of breath coming into [place] office but overall stable without angina or heart failure on exam.  Potassium was high at 5.6 so I stopped spironolactone.  Repeat potassium 5.1 on 1/25/202.  [name] came in yesterday with worsening shortness of breath hypoxia and BNP over 2000.  Chest x-ray pulmonary edema.  She is awaiting transfer to [facility] and was given IV Lasix.  Blood pressure was 195/119 in [place] ED but she is now hypotensive.  [name] says blood pressures have been running 140-150/60's at home.  She was having some dyspnea on exertion but Saturday after eating some stew from [place] church she became acutely short of breath and came to [place] ER.  Heart Pathway Score:     Past Medical History:  Diagnosis Date  . Bell's palsy   .  Breast cancer ([place]) 1998   Right mastectomy  . Essential hypertension   . GERD (gastroesophageal reflux disease)   . Gout   . History of skin cancer    Squamous cell, left shoulder  . History of stroke   . Mixed hyperlipidemia   . Osteopenia   . Reflux esophagitis   . Thyroid nodule   . Type 2 diabetes mellitus ([place])     Past Surgical History:  Procedure Laterality Date  . ABDOMINAL HYSTERECTOMY    . CATARACT EXTRACTION  2016  . CORONARY/GRAFT ACUTE MI REVASCULARIZATION N/A 01/30/2019   Procedure: Coronary/Graft Acute MI Revascularization;  Surgeon: Burnell Blanks, MD;  Location: [place] [facility];  Service: Cardiovascular;  Laterality: N/A;  . LEFT HEART CATH AND CORONARY ANGIOGRAPHY N/A 01/30/2019   Procedure: LEFT HEART CATH AND CORONARY ANGIOGRAPHY;  Surgeon: Burnell Blanks, MD;  Location: [place] [facility];  Service: Cardiovascular;  Laterality: N/A;  . Right mastectomy  1998   [place]     Home Medications:  Prior to Admission medications   Medication Sig Start Date End Date Taking? Authorizing Provider  nitroGLYCERIN (NITROSTAT) 0.4 MG SL tablet Place 1 tablet (0.4 mg total) under [place] tongue every 5 (five) minutes x 3 doses as needed for chest pain. 02/02/19  Yes Reino Bellis B, NP  acetaminophen (TYLENOL) 500 MG tablet Take 500 mg by mouth every 6 (six)  hours as needed for headache (pain).    [provider]  Alogliptin Benzoate 25 MG TABS Take 12.5 mg by mouth daily at 2 PM. 01/18/19   [provider]  aspirin EC 81 MG tablet Take 81 mg by mouth daily.    [provider]  Calcium Carbonate-Vitamin D (CALCIUM 500 + D PO) Take 1 tablet by mouth daily.     [provider]  candesartan (ATACAND) 32 MG tablet Take 32 mg by mouth daily with breakfast.  12/06/18   [provider]  carvedilol (COREG) 25 MG tablet Take 1 tablet (25 mg total) by mouth 2 (two) times daily with a meal. 02/02/19   Cheryln Manly, NP    cholecalciferol (VITAMIN D3) 25 MCG (1000 UT) tablet Take 1,000 Units by mouth daily with breakfast.    [provider]  CINNAMON PO Take 1 capsule by mouth daily with supper.    [provider]  diazepam (VALIUM) 2 MG tablet Take 2 mg by mouth 2 (two) times daily as needed (vertigo/dizziness).  12/02/18   [provider]  isosorbide mononitrate (IMDUR) 30 MG 24 hr tablet TAKE 1 TABLET IN [place] EVENING. [name] taking differently: Take 30 mg by mouth at bedtime.  12/06/18   Satira Sark, MD  meclizine (ANTIVERT) 25 MG tablet Take 25 mg by mouth 2 (two) times daily as needed for dizziness (vertigo).  12/02/18   [provider]  Multiple Vitamin (MULTIVITAMIN WITH MINERALS) TABS tablet Take 1 tablet by mouth every morning.    [provider]  multivitamin-lutein (OCUVITE-LUTEIN) CAPS capsule Take 1 capsule by mouth every morning.     [provider]  rosuvastatin (CRESTOR) 5 MG tablet Take 1 tablet (5 mg total) by mouth daily. 02/21/19   Burnell Blanks, MD  ticagrelor (BRILINTA) 90 MG TABS tablet Take 1 tablet (90 mg total) by mouth 2 (two) times daily. 02/02/19   Cheryln Manly, NP    Inpatient Medications: Scheduled Meds: . aspirin EC  81 mg Oral Daily  . calcium-vitamin D   Oral Daily  . carvedilol  25 mg Oral BID WC  . furosemide  40 mg Intravenous Q12H  . heparin  5,000 Units Subcutaneous Q8H  . insulin aspart  0-9 Units Subcutaneous TID WC  . isosorbide mononitrate  30 mg Oral Daily  . multivitamin with minerals  1 tablet Oral q morning - 10a  . rosuvastatin  5 mg Oral Daily  . sodium chloride flush  3 mL Intravenous Q12H  . ticagrelor  90 mg Oral BID  . cholecalciferol  1,000 Units Oral Q breakfast   Continuous Infusions: . sodium chloride    . nitroGLYCERIN Stopped (02/28/19 1011)   PRN Meds: sodium chloride, acetaminophen **OR** acetaminophen, bisacodyl, meclizine, ondansetron **OR** ondansetron (ZOFRAN) IV,  polyethylene glycol, sodium chloride flush, traMADol  Allergies:    Allergies  Allergen Reactions  . Bactrim [Sulfamethoxazole-Trimethoprim] Nausea And Vomiting  . Sulfa Antibiotics Nausea And Vomiting  . Amlodipine Other (See Comments)    EDEMA  . Clonidine Derivatives Other (See Comments)    FATIGUE  . Evista [Raloxifene Hcl] Other (See Comments)    GERD  . Fosamax [Alendronate Sodium] Other (See Comments)    INCREASED GERD   . Glipizide Other (See Comments)    PALPITATIONS  . Lipitor [Atorvastatin Calcium] Other (See Comments)    ACHING  . Losartan Other (See Comments)    FATIGUE   . Pravastatin Other (See Comments)  ACHING  . Azithromycin Rash and Other (See Comments)    FACIAL BURNING     Social History:   Social History   Socioeconomic History  . Marital status: Divorced    Spouse name: Not on file  . Number of children: Not on file  . Years of education: Not on file  . Highest education level: Not on file  Occupational History  . Not on file  Tobacco Use  . Smoking status: Never Smoker  . Smokeless tobacco: Never Used  Substance and Sexual Activity  . Alcohol use: Never  . Drug use: Never  . Sexual activity: Not on file  Other Topics Concern  . Not on file  Social History Narrative  . Not on file   Social Determinants of Health   Financial Resource Strain:   . Difficulty of Paying Living Expenses: Not on file  Food Insecurity:   . Worried About Charity fundraiser in [place] Last Year: Not on file  . Ran Out of Food in [place] Last Year: Not on file  Transportation Needs:   . Lack of Transportation (Medical): Not on file  . Lack of Transportation (Non-Medical): Not on file  Physical Activity:   . Days of Exercise per Week: Not on file  . Minutes of Exercise per Session: Not on file  Stress:   . Feeling of Stress : Not on file  Social Connections:   . Frequency of Communication with Friends and Family: Not on file  . Frequency of Social Gatherings  with Friends and Family: Not on file  . Attends Religious Services: Not on file  . Active Member of Clubs or Organizations: Not on file  . Attends Archivist Meetings: Not on file  . Marital Status: Not on file  Intimate Partner Violence:   . Fear of Current or Ex-Partner: Not on file  . Emotionally Abused: Not on file  . Physically Abused: Not on file  . Sexually Abused: Not on file    Family History:     Family History  Problem Relation Age of Onset  . Stroke Mother   . Heart attack Father   . Aneurysm Sister   . Heart attack Maternal Uncle   . Heart disease Maternal Uncle   . Heart attack Paternal Aunt   . Heart disease Paternal Aunt   . Heart attack Paternal Grandfather   . Heart disease Paternal Grandfather   . Heart attack Paternal Aunt   . Heart disease Paternal Aunt   . Heart attack Maternal Uncle   . Heart disease Maternal Uncle   . Heart attack Maternal Uncle   . Heart disease Maternal Uncle   . Heart attack Sister   . Heart disease Sister   . Cancer Sister      ROS:  Please see Crystal history of present illness.  Review of Systems  Constitution: Negative.  HENT: Negative.   Eyes: Negative.   Cardiovascular: Positive for dyspnea on exertion.  Respiratory: Positive for shortness of breath.   Hematologic/Lymphatic: Negative.   Musculoskeletal: Negative.  Negative for joint pain.  Gastrointestinal: Negative.   Genitourinary: Negative.   Neurological: Negative.    All other ROS reviewed and negative.     Physical Exam/Data:   Vitals:   02/28/19 0900 02/28/19 0915 02/28/19 0945 02/28/19 1000  BP: (!) 162/62 (!) 138/54 (!) 140/55 136/66  Pulse: 65 70 72 79  Resp: 17 15 20 15   Temp:      TempSrc:  SpO2: 97% 98% 99% 98%  Weight:      Height:        Intake/Output Summary (Last 24 hours) at 02/28/2019 1030 Last data filed at 02/28/2019 0700 Gross per 24 hour  Intake --  Output 1600 ml  Net -1600 ml   Last 3 Weights 02/27/2019 02/16/2019  02/02/2019  Weight (lbs) 150 lb 12.7 oz 151 lb 142 lb 4.8 oz  Weight (kg) 68.4 kg 68.493 kg 64.547 kg     Body mass index is 25.09 kg/m.  General:  Well nourished, well developed, in no acute distress  HEENT: normal Lymph: no adenopathy Neck: no JVD Endocrine:  No thryomegaly Vascular: No carotid bruits; FA pulses 2+ bilaterally without bruits  Cardiac:  normal S1, S2; RRR; positive S3 and S4 Lungs:  clear to auscultation bilaterally, no wheezing, rhonchi or rales  Abd: soft, nontender, no hepatomegaly  Ext: no edema Musculoskeletal:  No deformities, BUE and BLE strength normal and equal Skin: warm and dry  Neuro:  CNs 2-12 intact, no focal abnormalities noted Psych:  Normal affect   EKG:  Crystal EKG was personally reviewed and demonstrates: Sinus tachycardia at 104 bpm.  Lateral T wave inversion new since 01/31/2019 Telemetry:  Telemetry was personally reviewed and demonstrates: Normal sinus rhythm  Relevant CV Studies:     Cath: 01/30/19    Prox RCA lesion is 60% stenosed.  Mid RCA lesion is 40% stenosed.  Prox Cx to Mid Cx lesion is 20% stenosed.  Mid LAD lesion is 99% stenosed.  A drug-eluting stent was successfully placed using a SYNERGY XD 3.0X28.  Post intervention, there is a 0% residual stenosis.   1. Severe stenosis mid LAD 2. Successful PTCA/DES x 1 mid LAD 3. Mild non-obstructive disease in Crystal Circumflex 4. Moderate non-obstructive disease in Crystal mid RCA   Recommendations: Will admit to [place] ICU. DAPT with ASA and Brilinta for one year. Continue home beta blocker. Echo in am. She is statin intolerant. Fast track discharge, possibly home tomorrow if stable.    TTE: 01/31/19   IMPRESSIONS      1. Left ventricular ejection fraction, by visual estimation, is 30 to 35%. Crystal left ventricle has moderately decreased function. There is no left ventricular hypertrophy.  2. Severe akinesis of [place] left ventricular, apical apical segment.  3. Left ventricular diastolic  parameters are consistent with Grade II diastolic dysfunction (pseudonormalization).  4. Crystal left ventricle demonstrates regional wall motion abnormalities.  5. Global right ventricle has normal systolic function.Crystal right ventricular size is normal. No increase in right ventricular wall thickness.  6. Left atrial size was normal.  7. Right atrial size was normal.  8. Crystal mitral valve is grossly normal. Mild mitral valve regurgitation.  9. Crystal tricuspid valve is normal in structure. 10. Crystal aortic valve is tricuspid. Aortic valve regurgitation is trivial. No evidence of aortic valve sclerosis or stenosis. 11. Crystal pulmonic valve was normal in structure. Pulmonic valve regurgitation is not visualized. 12. Crystal atrial septum is grossly normal. _____________        Laboratory Data:  High Sensitivity Troponin:   Recent Labs  Lab 01/30/19 1727 01/30/19 2047 01/30/19 2300 02/27/19 1944 02/27/19 2138  TROPONINIHS 689* 6,609* 6,918* 29* 189*     Chemistry Recent Labs  Lab 02/21/19 1314 02/27/19 1944 02/28/19 0532  NA 131* 125* 128*  K 5.1 5.4* 3.7  CL 96 96* 93*  CO2 21 18* 23  GLUCOSE 118* 187* 156*  BUN 20 21 25*  CREATININE 1.22* 1.80* 1.63*  CALCIUM 9.7 8.4* 8.5*  GFRNONAA 41* 26* 29*  GFRAA 48* 30* 34*  ANIONGAP  --  11 12    Recent Labs  Lab 02/27/19 1944  PROT 6.6  ALBUMIN 3.6  AST 45*  ALT 26  ALKPHOS 74  BILITOT 1.2   Hematology Recent Labs  Lab 02/27/19 1944 02/28/19 0532  WBC 12.8* 11.3*  RBC 3.96 4.03  HGB 10.9* 11.1*  HCT 33.4* 34.1*  MCV 84.3 84.6  MCH 27.5 27.5  MCHC 32.6 32.6  RDW 14.1 13.9  PLT 303 258   BNP Recent Labs  Lab 02/27/19 1944  BNP 2,582.0*    DDimer No results for input(s): DDIMER in [place] last 168 hours.   Radiology/Studies:  DG Chest Portable 1 View  Result Date: 02/27/2019 CLINICAL DATA:  Shortness of breath EXAM: PORTABLE CHEST 1 VIEW COMPARISON:  01/30/2019 FINDINGS: Cardiac shadow is within normal limits. Aortic  calcifications are again seen. Vascular congestion is noted with evidence of bilateral parenchymal edema. No sizable effusion is seen. No bony abnormality is noted. IMPRESSION: Changes consistent with CHF. Electronically Signed   By: Inez Catalina M.D.   On: 02/27/2019 20:41         Assessment and Plan:   Acute on chronic systolic CHF LVEF 30 to 20% with severe akinesis of [place] LV and apical segment 01/31/2019.  Spironolactone stopped 02/16/2019 because of hyperkalemia and BP was up on admission but is stable now.  She has diuresed 2.4 L and is feeling much better.  Will cancel transfer to [facility] and treat here.  Creatinine 1.  63 today which is up potassium 3.7  CAD status post STEMI treated with DES to [place] LAD 01/2019 with residual nonobstructive disease in Crystal circumflex and RCA treated medically.  LVEF 30 to 35% with grade 2 DD on echo.  On carvedilol, aspirin, Brilinta-doing well without angina. Also having trouble affording Brilinta.  Second troponin up to 189.  We will continue to watch. to establish care in our [place] office.  Ischemic cardiomyopathy EF 30 to 35% spironolactone stopped in [place] office because of hyperkalemia.  Blood pressure has been trending up but she is also had some low blood pressures.  Continue to monitor.   History of resistant hypertension and left renal artery stenosis-BP doing much better. No changes.   Hyperlipidemia statin intolerant referred to lipid clinic-her insurance says they won't pay for PCSK9 inhibitor-lipid clinic started on low-dose rosuvastatin 5 mg once daily to see how she tolerates it.          For questions or updates, please contact [place] Please consult [email or web address] for contact info under     Signed, Ermalinda Barrios, PA-C  02/28/2019 10:30 AM   Crystal [name] was seen and examined, and I agree with Crystal history, physical exam, assessment and plan as documented above, with modifications as noted below. I have also personally reviewed all  relevant documentation, old records, labs, and both radiographic and cardiovascular studies. I have also independently interpreted old and new ECG's.  Briefly, this is an 85 year old woman with a history of coronary disease and recent STEMI with drug-eluting stent placement to [place] LAD in January 2021. Echocardiogram demonstrated severely reduced LV systolic function, LVEF 30 to 35%. She was then evaluated in [place] outpatient setting by Gerrianne Scale PA-C and was doing well at that time.  She has been on dual antiplatelet therapy with aspirin and Brilinta and has been maintained on carvedilol. She is  statin intolerant. She had been on spironolactone but developed hyperkalemia and this was stopped.  After being discharged from [place] hospital she had some exertional dyspnea when walking to [place] mailbox and back to her house. Over Crystal past 2 days she has been walking to her mailbox but that she would make it back to her house. She denies exertional chest pain but did have some chest tightness. Chest x-ray demonstrated CHF.  Labs reviewed above with creatinine of 1.63 and hemoglobin of 11.1. Sodium low at 128.  High-sensitivity troponins have peaked at 189. 4 weeks ago was 6918.  BNP markedly elevated at 2582.  She has been given IV Lasix for a total of 80 mg thus far and has diuresed 2.4 L and her shortness of breath is already improving. She was initially on BiPAP but is now on nasal cannula.  Recommendations: It was initially planned for her to transfer to [facility] for further evaluation and treatment but I spoke with [place] hospitalist, Dr. Carles Collet, and told him that I felt she was okay to stay at [facility] and [place] transfer was canceled.  We will plan to continue IV Lasix 40 mg twice daily for acute on chronic combined CHF (she has grade 2 diastolic dysfunction as well). We will try low-dose rosuvastatin 5 mg daily to see if she tolerates this. We will continue Imdur 30 mg. Will hold candesartan given  acute on chronic renal insufficiency. Troponin elevation is not consistent with ACS and is more indicative of demand ischemia in Crystal context of decompensated CHF. Continue dual antiplatelet therapy with aspirin and Brilinta.   Kate Sable, MD, [facility]  02/28/2019 1:49 PM

## 2019-02-28 NOTE — Progress Notes (Signed)
Patient is to be transferred to [facility] but currently still at [facility].  She is an 85 year old female with a history of CAD, ischemic cardiomyopathy with recent STEMI on 01/29/2018 status post heart catheterization with DES, diabetes mellitus type 2, hypertension, left renal artery stenosis, history of right breast cancer, as well as GERD and other comorbidities who presents with a chief complaint of dyspnea on exertion for 1 week.  She has denied any worsening orthopnea or lower extremity edema.  She presented for further evaluation and she is noted to be in respiratory distress with oxygen saturation in the 70s and she was placed on BiPAP.  She recently underwent cardiac catheterization on 01/30/2019 and was placed on dual antiplatelet therapy with aspirin and Brilinta given severe mid LAD stenosis status post DES.  She was also placed on carvedilol and spironolactone.  She read presented last night and placed on BiPAP as chest x-ray showed pulmonary edema.  She was started on IV furosemide and started on nitroglycerin drip and cardiology was consulted and felt that the ST changes that she had on EKG were secondary to evolving and is from her recent MI but felt that she warranted transfer to [facility] for further evaluation and management.  She was seen and examined my partner and colleague Dr. Shanon Brow Tat and we are currently awaiting arrival to [facility].

## 2019-02-28 NOTE — ED Notes (Signed)
Tapering Nitro for systolic BP < 718 per verbal order Dr. Carles Collet

## 2019-02-28 NOTE — Progress Notes (Signed)
Patient taken off of BIPAP and placed on 3L [place]. Patient states she feels much better and is not having any trouble at this time with her breathing. All vitals are stable. RN aware.

## 2019-02-28 NOTE — ED Notes (Signed)
Pt was given meal tray.  

## 2019-03-01 DIAGNOSIS — I2510 Atherosclerotic heart disease of native coronary artery without angina pectoris: Secondary | ICD-10-CM

## 2019-03-01 DIAGNOSIS — N183 Chronic kidney disease, stage 3 unspecified: Secondary | ICD-10-CM

## 2019-03-01 LAB — COMPREHENSIVE METABOLIC PANEL
ALT: 35 U/L (ref 0–44)
AST: 25 U/L (ref 15–41)
Albumin: 3 g/dL — ABNORMAL LOW (ref 3.5–5.0)
Alkaline Phosphatase: 67 U/L (ref 38–126)
Anion gap: 12 (ref 5–15)
BUN: 28 mg/dL — ABNORMAL HIGH (ref 8–23)
CO2: 26 mmol/L (ref 22–32)
Calcium: 8.7 mg/dL — ABNORMAL LOW (ref 8.9–10.3)
Chloride: 94 mmol/L — ABNORMAL LOW (ref 98–111)
Creatinine, Ser: 1.31 mg/dL — ABNORMAL HIGH (ref 0.44–1.00)
GFR calc Af Amer: 44 mL/min — ABNORMAL LOW (ref >60)
GFR calc non Af Amer: 38 mL/min — ABNORMAL LOW (ref >60)
Glucose, Bld: 108 mg/dL — ABNORMAL HIGH (ref 70–99)
Potassium: 3.3 mmol/L — ABNORMAL LOW (ref 3.5–5.1)
Sodium: 132 mmol/L — ABNORMAL LOW (ref 135–145)
Total Bilirubin: 0.7 mg/dL (ref 0.3–1.2)
Total Protein: 5.8 g/dL — ABNORMAL LOW (ref 6.5–8.1)

## 2019-03-01 LAB — CBC WITH DIFFERENTIAL/PLATELET
Abs Immature Granulocytes: 0.05 10*3/uL (ref 0.00–0.07)
Basophils Absolute: 0 10*3/uL (ref 0.0–0.1)
Basophils Relative: 0 %
Eosinophils Absolute: 0.2 10*3/uL (ref 0.0–0.5)
Eosinophils Relative: 3 %
HCT: 32.6 % — ABNORMAL LOW (ref 36.0–46.0)
Hemoglobin: 10.6 g/dL — ABNORMAL LOW (ref 12.0–15.0)
Immature Granulocytes: 1 %
Lymphocytes Relative: 20 %
Lymphs Abs: 1.9 10*3/uL (ref 0.7–4.0)
MCH: 26.9 pg (ref 26.0–34.0)
MCHC: 32.5 g/dL (ref 30.0–36.0)
MCV: 82.7 fL (ref 80.0–100.0)
Monocytes Absolute: 1.3 10*3/uL — ABNORMAL HIGH (ref 0.1–1.0)
Monocytes Relative: 14 %
Neutro Abs: 6.1 10*3/uL (ref 1.7–7.7)
Neutrophils Relative %: 62 %
Platelets: 294 10*3/uL (ref 150–400)
RBC: 3.94 MIL/uL (ref 3.87–5.11)
RDW: 13.7 % (ref 11.5–15.5)
WBC: 9.6 10*3/uL (ref 4.0–10.5)
nRBC: 0 % (ref 0.0–0.2)

## 2019-03-01 LAB — GLUCOSE, CAPILLARY
Glucose-Capillary: 108 mg/dL — ABNORMAL HIGH (ref 70–99)
Glucose-Capillary: 157 mg/dL — ABNORMAL HIGH (ref 70–99)
Glucose-Capillary: 85 mg/dL (ref 70–99)
Glucose-Capillary: 123 mg/dL — ABNORMAL HIGH (ref 70–99)

## 2019-03-01 LAB — PHOSPHORUS: Phosphorus: 3.6 mg/dL (ref 2.5–4.6)

## 2019-03-01 LAB — HEMOGLOBIN A1C
Hgb A1c MFr Bld: 6.1 % — ABNORMAL HIGH (ref 4.8–5.6)
Mean Plasma Glucose: 128.37 mg/dL

## 2019-03-01 LAB — MAGNESIUM: Magnesium: 1.8 mg/dL (ref 1.7–2.4)

## 2019-03-01 MED ORDER — POTASSIUM CHLORIDE CRYS ER 20 MEQ PO TBCR
40.0000 meq | EXTENDED_RELEASE_TABLET | Freq: Once | ORAL | Status: AC
  Administered 2019-03-01: 40 meq via ORAL
  Filled 2019-03-01: qty 2

## 2019-03-01 NOTE — Progress Notes (Addendum)
PROGRESS NOTE  Crystal [name] JHE:[id number] DOB: 05/01/1934 DOA: 02/27/2019 PCP: Glenda Chroman, MD  Brief History:  85 year old female with a history of coronary artery disease, ischemic cardiomyopathy with a recent STEMI 01/30/2019 status post heart catheterization with DES, diabetes mellitus type 2, hypertension, left renal artery stenosis, right breast cancer, and GERD presenting with dyspnea on exertion for approximately 1 week.  She stated that she had had intermittent chest discomfort with the dyspnea on exertion.   She stated that her shortness of breath gradually worsened over the past week to the point where she had difficulty catching her breath after a bath on 02/27/2019.  She denied any worsening orthopnea or lower extremity edema.  As result, she presented for further evaluation.  Upon presentation, she was noted to be in respiratory distress with oxygen saturation in the 70s, and she was  placed on BiPAP.  Notably, the patient had a recent admission to the hospital from 01/30/2019 to 02/02/2019 during which time she had an STEMI.  She underwent cardiac catheterization on 02/26/2019 which showed severe mid LAD stenosis status post DES x1.  She was started on aspirin and Brilinta.  She was sent home on carvedilol 25 mg twice daily and spironolactone.  01/31/2019 echo showed EF 30-35%. In the emergency department, the patient was placed on BiPAP.  Chest x-ray showed pulmonary edema.  She was given IV furosemide and started on nitroglycerin drip.  Case was discussed with cardiology fellow and subsequently with Dr. Ellyn Hack.  Dr. Ellyn Hack to opined that that ST changes are likely secondary to evolving changes from her recent MI and did not feel the patient was having a STEMI.  They recommended transferring the patient to [facility] for further management.  Later, Dr. Bronson Ing saw the patient and felt the patient could stay at [facility].  Assessment/Plan: Acute respiratory failure with  hypoxia -Secondary to pulmonary edema -Initially placed on BiPAP -Currently stable on 2 L nasal cannula>>>1.5L -Wean oxygen as tolerated for saturation greater 92% -personally reviewed CXR--pulmonary edema  Acute on chronic systolic and diastolic CHF -02/03/5629 echo EF 30-35%, G2DD, + regional WMA, severe apical AK -Continue furosemide 40 mg IV twice daily -Restarted Imdur -Weaned off nitroglycerin drip -Continue carvedilol -Daily weights--NEG 4 lbs -Accurate I's and O's -Holding ARB secondary to worsening serum creatinine for now -appreciate cardiology follow up  Acute on chronic renal failure--CKD stage IIIb -Baseline creatinine 1.2-1.4 -Presented with serum creatinine 1.80 -Monitor with diuresis  Coronary artery disease -Continue aspirin and Brilinta -No chest pain presently -Continue Crestor -personally reviewed EKG--sinus, TWI V4-V6  Hyponatremia -Secondary to fluid overload -Improving with diuresis  Diabetes mellitus type 2 -Hemoglobin A1c--6.1 -Holding alogliptin -NovoLog sliding scale   Essential hypertension -Continue carvedilol -Holding ARB secondary to acute on chronic renal failure  Hyperlipidemia -previously statin intolerant--now tolerating low dose crestor -Continue statin  -01/30/19 LDL 229  Hypokalemia -replete -check mag       Disposition Plan:   Home in 1-2 days when cleared by cardiology and adequate fluid removal Family Communication:   Family at bedside  Consultants:  cardiology  Code Status:  FULL   DVT Prophylaxis:  [place] Heparin    Procedures: As Listed in Progress Note Above  Antibiotics: None      Subjective: Patient denies fevers, chills, headache, chest pain,nausea, vomiting, diarrhea, abdominal pain, dysuria, hematuria, hematochezia, and melena.  She feels dyspnea is improving, but still has some dyspnea on exertion  Objective: Vitals:   02/28/19 2025 03/01/19 0438 03/01/19 1442 03/01/19  1543  BP: (!) 117/43 (!) 137/52 128/63   Pulse: 80 79 76 75  Resp: 20 20 16    Temp: 98.9 F (37.2 C) 99.1 F (37.3 C)    TempSrc: Oral Oral    SpO2: 99% 98% 99% 98%  Weight:  66.3 kg    Height:        Intake/Output Summary (Last 24 hours) at 03/01/2019 1631 Last data filed at 03/01/2019 1300 Gross per 24 hour  Intake 720 ml  Output 1750 ml  Net -1030 ml   Weight change: -2.7 kg Exam:   General:  Pt is alert, follows commands appropriately, not in acute distress  HEENT: No icterus, No thrush, No neck mass, [place]/AT  Cardiovascular: RRR, S1/S2, no rubs, no gallops  Respiratory: bibasilar crackles. No wheeze  Abdomen: Soft/+BS, non tender, non distended, no guarding  Extremities: No edema, No lymphangitis, No petechiae, No rashes, no synovitis   Data Reviewed: I have personally reviewed following labs and imaging studies Basic Metabolic Panel: Recent Labs  Lab 02/27/19 1944 02/28/19 0532 03/01/19 0627  NA 125* 128* 132*  K 5.4* 3.7 3.3*  CL 96* 93* 94*  CO2 18* 23 26  GLUCOSE 187* 156* 108*  BUN 21 25* 28*  CREATININE 1.80* 1.63* 1.31*  CALCIUM 8.4* 8.5* 8.7*  MG  --   --  1.8  PHOS  --   --  3.6   Liver Function Tests: Recent Labs  Lab 02/27/19 1944 03/01/19 0627  AST 45* 25  ALT 26 35  ALKPHOS 74 67  BILITOT 1.2 0.7  PROT 6.6 5.8*  ALBUMIN 3.6 3.0*   No results for input(s): LIPASE, AMYLASE in the last 168 hours. No results for input(s): AMMONIA in the last 168 hours. Coagulation Profile: Recent Labs  Lab 02/27/19 1944  INR 1.2   CBC: Recent Labs  Lab 02/27/19 1944 02/28/19 0532 03/01/19 0627  WBC 12.8* 11.3* 9.6  NEUTROABS 9.1*  --  6.1  HGB 10.9* 11.1* 10.6*  HCT 33.4* 34.1* 32.6*  MCV 84.3 84.6 82.7  PLT 303 258 294   Cardiac Enzymes: No results for input(s): CKTOTAL, CKMB, CKMBINDEX, TROPONINI in the last 168 hours. BNP: Invalid input(s): POCBNP CBG: Recent Labs  Lab 02/28/19 1648 02/28/19 2023 03/01/19 0736 03/01/19 1115  03/01/19 1617  GLUCAP 135* 112* 108* 157* 85   HbA1C: Recent Labs    02/28/19 0532  HGBA1C 6.1*   Urine analysis: No results found for: COLORURINE, APPEARANCEUR, LABSPEC, PHURINE, GLUCOSEU, HGBUR, BILIRUBINUR, KETONESUR, PROTEINUR, UROBILINOGEN, NITRITE, LEUKOCYTESUR Sepsis Labs: @LABRCNTIP (procalcitonin:4,lacticidven:4) ) Recent Results (from the past 240 hour(s))  Respiratory Panel by RT PCR (Flu A&B, Covid) - Nasopharyngeal Swab     Status: None   Collection Time: 02/27/19  7:49 PM   Specimen: Nasopharyngeal Swab  Result Value Ref Range Status   SARS Coronavirus 2 by RT PCR NEGATIVE NEGATIVE Final    Comment: (NOTE) SARS-CoV-2 target nucleic acids are NOT DETECTED. The SARS-CoV-2 RNA is generally detectable in upper respiratoy specimens during the acute phase of infection. The lowest concentration of SARS-CoV-2 viral copies this assay can detect is 131 copies/mL. A negative result does not preclude SARS-Cov-2 infection and should not be used as the sole basis for treatment or other patient management decisions. A negative result may occur with  improper specimen collection/handling, submission of specimen other than nasopharyngeal swab, presence of viral mutation(s) within the areas targeted by this assay, and  inadequate number of viral copies (<131 copies/mL). A negative result must be combined with clinical observations, patient history, and epidemiological information. The expected result is Negative. Fact Sheet for Patients:  PinkCheek.be Fact Sheet for Healthcare Providers:  GravelBags.it This test is not yet ap proved or cleared by the [place] FDA and  has been authorized for detection and/or diagnosis of SARS-CoV-2 by FDA under an Emergency Use Authorization (EUA). This EUA will remain  in effect (meaning this test can be used) for the duration of the COVID-19 declaration under Section 564(b)(1) of the  Act, 21 U.S.C. section 360bbb-3(b)(1), unless the authorization is terminated or revoked sooner.    Influenza A by PCR NEGATIVE NEGATIVE Final   Influenza B by PCR NEGATIVE NEGATIVE Final    Comment: (NOTE) The Xpert Xpress SARS-CoV-2/FLU/RSV assay is intended as an aid in  the diagnosis of influenza from Nasopharyngeal swab specimens and  should not be used as a sole basis for treatment. Nasal washings and  aspirates are unacceptable for Xpert Xpress SARS-CoV-2/FLU/RSV  testing. Fact Sheet for Patients: PinkCheek.be Fact Sheet for Healthcare Providers: GravelBags.it This test is not yet approved or cleared by the [place] FDA and  has been authorized for detection and/or diagnosis of SARS-CoV-2 by  FDA under an Emergency Use Authorization (EUA). This EUA will remain  in effect (meaning this test can be used) for the duration of the  Covid-19 declaration under Section 564(b)(1) of the Act, 21  U.S.C. section 360bbb-3(b)(1), unless the authorization is  terminated or revoked. Performed at [facility], [address], [place], [place] [address]      Scheduled Meds: . aspirin EC  81 mg Oral Daily  . calcium-vitamin D   Oral Daily  . carvedilol  25 mg Oral BID WC  . furosemide  40 mg Intravenous Q12H  . heparin  5,000 Units Subcutaneous Q8H  . insulin aspart  0-9 Units Subcutaneous TID WC  . isosorbide mononitrate  30 mg Oral Daily  . multivitamin with minerals  1 tablet Oral q morning - 10a  . rosuvastatin  5 mg Oral Daily  . sodium chloride flush  3 mL Intravenous Q12H  . ticagrelor  90 mg Oral BID  . cholecalciferol  1,000 Units Oral Q breakfast   Continuous Infusions: . sodium chloride    . nitroGLYCERIN Stopped (02/28/19 1011)    Procedures/Studies: CARDIAC CATHETERIZATION  Result Date: 01/30/2019  Prox RCA lesion is 60% stenosed.  Mid RCA lesion is 40% stenosed.  Prox Cx to Mid Cx lesion is 20%  stenosed.  Mid LAD lesion is 99% stenosed.  A drug-eluting stent was successfully placed using a SYNERGY XD [id number].  Post intervention, there is a 0% residual stenosis.  1. Severe stenosis mid LAD 2. Successful PTCA/DES x 1 mid LAD 3. Mild non-obstructive disease in the Circumflex 4. Moderate non-obstructive disease in the mid RCA Recommendations: Will admit to the ICU. DAPT with ASA and Brilinta for one year. Continue home beta blocker. Echo in am. She is statin intolerant. Fast track discharge, possibly home tomorrow if stable.   DG CHEST PORT 1 VIEW  Result Date: 02/28/2019 CLINICAL DATA:  Shortness of breath EXAM: PORTABLE CHEST 1 VIEW COMPARISON:  02/27/2019 FINDINGS: Improved aeration with persistent pulmonary vascular congestion. No significant pleural effusion. No pneumothorax. Stable cardiomediastinal contours. IMPRESSION: Improved lung aeration since 02/27/2019 persistent pulmonary vascular congestion. Electronically Signed   By: Macy Mis M.D.   On: 02/28/2019 11:45   DG Chest Portable  1 View  Result Date: 02/27/2019 CLINICAL DATA:  Shortness of breath EXAM: PORTABLE CHEST 1 VIEW COMPARISON:  01/30/2019 FINDINGS: Cardiac shadow is within normal limits. Aortic calcifications are again seen. Vascular congestion is noted with evidence of bilateral parenchymal edema. No sizable effusion is seen. No bony abnormality is noted. IMPRESSION: Changes consistent with CHF. Electronically Signed   By: Inez Catalina M.D.   On: 02/27/2019 20:41   DG Chest Port 1 View  Result Date: 01/30/2019 CLINICAL DATA:  Chest pain EXAM: PORTABLE CHEST 1 VIEW COMPARISON:  None. FINDINGS: The heart size and mediastinal contours are within normal limits. Both lungs are clear. The visualized skeletal structures are unremarkable. IMPRESSION: No active disease. Electronically Signed   By: Kerby Moors M.D.   On: 01/30/2019 17:57   ECHOCARDIOGRAM COMPLETE  Result Date: 01/31/2019   ECHOCARDIOGRAM REPORT   Patient  Name:   Crystal [name] Date of Exam: 01/31/2019 Medical Rec #:  [id number]        Height:       65.0 in Accession #:    [id number]       Weight:       140.0 lb Date of Birth:  11-Jun-1934        BSA:          1.70 m Patient Age:    30 years         BP:           107/72 mmHg Patient Gender: F                HR:           73 bpm. Exam Location:  Inpatient Procedure: 2D Echo, Color Doppler and Cardiac Doppler Indications:    I25.110 Atherosclerotic heart disease of native coronary artery                 with unstable angina pectoris  History:        Patient has no prior history of Echocardiogram examinations.                 Acute MI; Risk Factors:Hypertension, Diabetes and Dyslipidemia.                 Patient is post stent placement.  Sonographer:    Roseanna Rainbow RDCS Referring Phys: [place]  Sonographer Comments: Image acquisition challenging due to respiratory motion. IMPRESSIONS  1. Left ventricular ejection fraction, by visual estimation, is 30 to 35%. The left ventricle has moderately decreased function. There is no left ventricular hypertrophy.  2. Severe akinesis of the left ventricular, apical apical segment.  3. Left ventricular diastolic parameters are consistent with Grade II diastolic dysfunction (pseudonormalization).  4. The left ventricle demonstrates regional wall motion abnormalities.  5. Global right ventricle has normal systolic function.The right ventricular size is normal. No increase in right ventricular wall thickness.  6. Left atrial size was normal.  7. Right atrial size was normal.  8. The mitral valve is grossly normal. Mild mitral valve regurgitation.  9. The tricuspid valve is normal in structure. 10. The aortic valve is tricuspid. Aortic valve regurgitation is trivial. No evidence of aortic valve sclerosis or stenosis. 11. The pulmonic valve was normal in structure. Pulmonic valve regurgitation is not visualized. 12. The atrial septum is grossly normal. FINDINGS  Left  Ventricle: Left ventricular ejection fraction, by visual estimation, is 30 to 35%. The left ventricle has moderately decreased function. Severe akinesis of the left ventricular,  apical apical segment. The left ventricle demonstrates regional wall motion abnormalities. There is no left ventricular hypertrophy. Left ventricular diastolic parameters are consistent with Grade II diastolic dysfunction (pseudonormalization). Right Ventricle: The right ventricular size is normal. No increase in right ventricular wall thickness. Global RV systolic function is has normal systolic function. Left Atrium: Left atrial size was normal in size. Right Atrium: Right atrial size was normal in size Pericardium: There is no evidence of pericardial effusion. Mitral Valve: The mitral valve is grossly normal. Mild mitral valve regurgitation. Tricuspid Valve: The tricuspid valve is normal in structure. Tricuspid valve regurgitation is mild. Aortic Valve: The aortic valve is tricuspid. . There is mild thickening of the aortic valve. Aortic valve regurgitation is trivial. The aortic valve is structurally normal, with no evidence of sclerosis or stenosis. There is mild thickening of the aortic  valve. Pulmonic Valve: The pulmonic valve was normal in structure. Pulmonic valve regurgitation is not visualized. Pulmonic regurgitation is not visualized. Aorta: The aortic root and ascending aorta are structurally normal, with no evidence of dilitation. IAS/Shunts: The atrial septum is grossly normal.  LEFT VENTRICLE PLAX 2D LVIDd:         4.21 cm       Diastology LVIDs:         2.68 cm       LV e' lateral:   4.03 cm/s LV PW:         1.30 cm       LV E/e' lateral: 30.8 LV IVS:        1.40 cm       LV e' medial:    4.13 cm/s LVOT diam:     1.80 cm       LV E/e' medial:  30.0 LV SV:         53 ml LV SV Index:   30.73 LVOT Area:     2.54 cm  LV Volumes (MOD) LV area d, A2C:    26.90 cm LV area d, A4C:    25.80 cm LV area s, A2C:    19.00 cm LV area  s, A4C:    21.40 cm LV major d, A2C:   8.06 cm LV major d, A4C:   7.92 cm LV major s, A2C:   8.10 cm LV major s, A4C:   8.07 cm LV vol d, MOD A2C: 73.3 ml LV vol d, MOD A4C: 69.2 ml LV vol s, MOD A2C: 37.3 ml LV vol s, MOD A4C: 45.4 ml LV SV MOD A2C:     36.0 ml LV SV MOD A4C:     69.2 ml LV SV MOD BP:      31.4 ml RIGHT VENTRICLE             IVC RV S prime:     10.40 cm/s  IVC diam: 1.60 cm TAPSE (M-mode): 2.0 cm LEFT ATRIUM             Index       RIGHT ATRIUM           Index LA diam:        2.40 cm 1.41 cm/m  RA Area:     13.00 cm LA Vol (A2C):   44.2 ml 26.00 ml/m RA Volume:   31.50 ml  18.53 ml/m LA Vol (A4C):   36.2 ml 21.30 ml/m LA Biplane Vol: 40.3 ml 23.71 ml/m  AORTIC VALVE             PULMONIC  VALVE LVOT Vmax:   106.00 cm/s PR End Diast Vel: 1.71 msec LVOT Vmean:  69.600 cm/s LVOT VTI:    0.225 m  AORTA Ao Root diam: 2.85 cm Ao Asc diam:  2.80 cm MITRAL VALVE MV Area (PHT): 2.60 cm              SHUNTS MV PHT:        84.68 msec            Systemic VTI:  0.22 m MV Decel Time: 292 msec              Systemic Diam: 1.80 cm MV E velocity: 124.00 cm/s 103 cm/s MV A velocity: 137.00 cm/s 70.3 cm/s MV E/A ratio:  0.91        1.5  Mertie Moores MD Electronically signed by Mertie Moores MD Signature Date/Time: 01/31/2019/1:46:41 PM    Final     Orson Eva, DO  [facility] Pager [phone]  If 7PM-7AM, please contact night-coverage [email or web address] Password [email or web address] 03/01/2019, 4:31 PM   LOS: 2 days

## 2019-03-01 NOTE — Progress Notes (Addendum)
Progress Note  Patient Name: Crystal [name] Date of Encounter: 03/01/2019  Primary Cardiologist: Lauree Chandler, MD (was followed by Dr. Domenic Polite in 2019 and wishes to switch back to [place], [place])  Subjective   Breathing continues to improve. Slept with her usual 2 pillows last night with no PND. No recent chest pain or palpitations.She has not yet ambulated.    Inpatient Medications    Scheduled Meds: . aspirin EC  81 mg Oral Daily  . calcium-vitamin D   Oral Daily  . carvedilol  25 mg Oral BID WC  . furosemide  40 mg Intravenous Q12H  . heparin  5,000 Units Subcutaneous Q8H  . insulin aspart  0-9 Units Subcutaneous TID WC  . isosorbide mononitrate  30 mg Oral Daily  . multivitamin with minerals  1 tablet Oral q morning - 10a  . rosuvastatin  5 mg Oral Daily  . sodium chloride flush  3 mL Intravenous Q12H  . ticagrelor  90 mg Oral BID  . cholecalciferol  1,000 Units Oral Q breakfast   Continuous Infusions: . sodium chloride    . nitroGLYCERIN Stopped (02/28/19 1011)   PRN Meds: sodium chloride, acetaminophen **OR** acetaminophen, bisacodyl, meclizine, ondansetron **OR** ondansetron (ZOFRAN) IV, polyethylene glycol, sodium chloride flush, traMADol   Vital Signs    Vitals:   02/28/19 1400 02/28/19 1520 02/28/19 2025 03/01/19 0438  BP: (!) 123/52 (!) 129/55 (!) 117/43 (!) 137/52  Pulse: 67 76 80 79  Resp: 16 18 20 20   Temp:  98.3 F (36.8 C) 98.9 F (37.2 C) 99.1 F (37.3 C)  TempSrc:  Oral Oral Oral  SpO2: 100% 99% 99% 98%  Weight:  65.7 kg  66.3 kg  Height:  5\' 5"  (1.651 m)      Intake/Output Summary (Last 24 hours) at 03/01/2019 0759 Last data filed at 03/01/2019 0600 Gross per 24 hour  Intake 310.47 ml  Output 1800 ml  Net -1489.53 ml    Last 3 Weights 03/01/2019 02/28/2019 02/27/2019  Weight (lbs) 146 lb 2.6 oz 144 lb 13.5 oz 150 lb 12.7 oz  Weight (kg) 66.3 kg 65.7 kg 68.4 kg      Telemetry    NSR, HR in 60's to 80's with occasional PVC's. -  Personally Reviewed  ECG    No new tracings.   Physical Exam   General: Well developed, well nourished, female appearing in no acute distress. Head: Normocephalic, atraumatic.  Neck: Supple without bruits, JVD not elevated. Lungs:  Resp regular and unlabored, mild rales along bases bilaterally. Heart: RRR, S1, S2, no S3, S4, or murmur; no rub. Abdomen: Soft, non-tender, non-distended with normoactive bowel sounds. No hepatomegaly. No rebound/guarding. No obvious abdominal masses. Extremities: No clubbing, cyanosis, or edema. Distal pedal pulses are 2+ bilaterally. Neuro: Alert and oriented X 3. Moves all extremities spontaneously. Psych: Normal affect.  Labs    Chemistry Recent Labs  Lab 02/27/19 1944 02/28/19 0532 03/01/19 0627  NA 125* 128* 132*  K 5.4* 3.7 3.3*  CL 96* 93* 94*  CO2 18* 23 26  GLUCOSE 187* 156* 108*  BUN 21 25* 28*  CREATININE 1.80* 1.63* 1.31*  CALCIUM 8.4* 8.5* 8.7*  PROT 6.6  --  5.8*  ALBUMIN 3.6  --  3.0*  AST 45*  --  25  ALT 26  --  35  ALKPHOS 74  --  67  BILITOT 1.2  --  0.7  GFRNONAA 26* 29* 38*  GFRAA 30* 34* 44*  ANIONGAP 11 12  12     Hematology Recent Labs  Lab 02/27/19 1944 02/28/19 0532 03/01/19 0627  WBC 12.8* 11.3* 9.6  RBC 3.96 4.03 3.94  HGB 10.9* 11.1* 10.6*  HCT 33.4* 34.1* 32.6*  MCV 84.3 84.6 82.7  MCH 27.5 27.5 26.9  MCHC 32.6 32.6 32.5  RDW 14.1 13.9 13.7  PLT 303 258 294    Cardiac EnzymesNo results for input(s): TROPONINI in the last 168 hours. No results for input(s): TROPIPOC in the last 168 hours.   BNP Recent Labs  Lab 02/27/19 1944  BNP 2,582.0*     DDimer No results for input(s): DDIMER in the last 168 hours.   Radiology    DG CHEST PORT 1 VIEW  Result Date: 02/28/2019 CLINICAL DATA:  Shortness of breath EXAM: PORTABLE CHEST 1 VIEW COMPARISON:  02/27/2019 FINDINGS: Improved aeration with persistent pulmonary vascular congestion. No significant pleural effusion. No pneumothorax. Stable  cardiomediastinal contours. IMPRESSION: Improved lung aeration since 02/27/2019 persistent pulmonary vascular congestion. Electronically Signed   By: Macy Mis M.D.   On: 02/28/2019 11:45   DG Chest Portable 1 View  Result Date: 02/27/2019 CLINICAL DATA:  Shortness of breath EXAM: PORTABLE CHEST 1 VIEW COMPARISON:  01/30/2019 FINDINGS: Cardiac shadow is within normal limits. Aortic calcifications are again seen. Vascular congestion is noted with evidence of bilateral parenchymal edema. No sizable effusion is seen. No bony abnormality is noted. IMPRESSION: Changes consistent with CHF. Electronically Signed   By: Inez Catalina M.D.   On: 02/27/2019 20:41    Cardiac Studies   Cardiac Catheterization: 01/30/2019  Prox RCA lesion is 60% stenosed.  Mid RCA lesion is 40% stenosed.  Prox Cx to Mid Cx lesion is 20% stenosed.  Mid LAD lesion is 99% stenosed.  A drug-eluting stent was successfully placed using a SYNERGY XD 3.0X28.  Post intervention, there is a 0% residual stenosis.   1. Severe stenosis mid LAD 2. Successful PTCA/DES x 1 mid LAD 3. Mild non-obstructive disease in the Circumflex 4. Moderate non-obstructive disease in the mid RCA  Recommendations: Will admit to the ICU. DAPT with ASA and Brilinta for one year. Continue home beta blocker. Echo in am. She is statin intolerant. Fast track discharge, possibly home tomorrow if stable.   Echocardiogram: 01/31/2019 IMPRESSIONS    1. Left ventricular ejection fraction, by visual estimation, is 30 to  35%. The left ventricle has moderately decreased function. There is no  left ventricular hypertrophy.  2. Severe akinesis of the left ventricular, apical apical segment.  3. Left ventricular diastolic parameters are consistent with Grade II  diastolic dysfunction (pseudonormalization).  4. The left ventricle demonstrates regional wall motion abnormalities.  5. Global right ventricle has normal systolic function.The right    ventricular size is normal. No increase in right ventricular wall  thickness.  6. Left atrial size was normal.  7. Right atrial size was normal.  8. The mitral valve is grossly normal. Mild mitral valve regurgitation.  9. The tricuspid valve is normal in structure.  10. The aortic valve is tricuspid. Aortic valve regurgitation is trivial.  No evidence of aortic valve sclerosis or stenosis.  11. The pulmonic valve was normal in structure. Pulmonic valve  regurgitation is not visualized.  12. The atrial septum is grossly normal.    Patient Profile     85 y.o. female w/ PMH of CAD (s/p recent STEMI in 01/2019 with DES to mid-LAD), chronic systolic CHF/ICM (EF 95-62% by echo in 01/2019), HTN, HLD (statin intolerant), renal artery  stenosis, and history of breast cancer (s/p R mastectomy) who presented to [facility] ED on 02/27/2019 for evaluation of worsening dyspnea. Currently admitted for an acute CHF exacerbation.   Assessment & Plan    1. Acute Hypoxic Respiratory Failure in the setting of Acute on Chronic Combined CHF Exacerbation - she has a known reduced EF of 30-35% by echo in 01/2019 with plans for a repeat echo in 3 months following optimal medical therapy.  - saturations initially in the 70's and required BiPAP --> now transitioned to nasal cannula. BNP initially elevated to 2582 and CXR consistent with CHF.  - on Coreg 25mg  BID and Candesartan 32mg  daily as an outpatient. Had been on Spironolactone 25mg  daily as an outpatient but this was discontinued given hyperkalemia.  - she is receiving IV Lasix 40mg  BID with a recorded net output of -3.1L thus far and weight has declined by 4 lbs. Creatinine continues to improve with diuresis. Would continue with IV Lasix today. She was not on a standing diuretic dose at the time of admission so would anticipate adding this at discharge with close follow-up labs as an outpatient. Could consider Entresto as an outpatient as well but she reports  cost is already an issue with Brilinta so there might be financial barriers to this as well unless approved for patient assistance.   2. CAD - recent STEMI in 01/2019 with DES to mid-LAD as outlined above. EKG on admission showed TWI along anterior and lateral leads which was thought to be most consistent with evolving changes from her recent MI. HS Troponin values at 29 and 189 this admission, most consistent with demand ischemia in the setting of her CHF exacerbation. - continue ASA, Brilinta, BB and statin therapy.    3. HTN - BP has been variable from 109/43 - 162/77 within the past 24 hours, at 137/52 on most recent check.  - continue Coreg and Imdur. Given improvement in renal function, would anticipate adding back Candesartan prior to discharge.  4. HLD - FLP earlier this month showed total cholesterol of 331, HDL 68, triglycerides 169 and LDL 229. She has been started on Crestor 5mg  daily by [facility].   5. Stage 3 CKD - baseline creatinine ~ 1.2. Elevated to 1.80 on admission, improved to 1.31 today. She is mildly hypokalemic at 3.3 today and will order supplementation.   For questions or updates, please contact [place] Please consult [email or web address] for contact info under Cardiology/STEMI.   Signed, Erma Heritage , PA-C 7:59 AM 03/01/2019 Pager: [phone]  The patient was seen and examined, and I agree with the history, physical exam, assessment and plan as documented above.  She is doing much better and slept well. Shortness of breath has significantly improved. Renal function also improving with diuresis. Would anticipate resuming ARB prior to discharge. Otherwise, continue medical therapy as detailed above.  Kate Sable, MD, [facility]  03/01/2019 9:07 AM

## 2019-03-02 LAB — BASIC METABOLIC PANEL
Anion gap: 14 (ref 5–15)
BUN: 32 mg/dL — ABNORMAL HIGH (ref 8–23)
CO2: 28 mmol/L (ref 22–32)
Calcium: 9.3 mg/dL (ref 8.9–10.3)
Chloride: 93 mmol/L — ABNORMAL LOW (ref 98–111)
Creatinine, Ser: 1.23 mg/dL — ABNORMAL HIGH (ref 0.44–1.00)
GFR calc Af Amer: 47 mL/min — ABNORMAL LOW (ref >60)
GFR calc non Af Amer: 41 mL/min — ABNORMAL LOW (ref >60)
Glucose, Bld: 115 mg/dL — ABNORMAL HIGH (ref 70–99)
Potassium: 3.5 mmol/L (ref 3.5–5.1)
Sodium: 135 mmol/L (ref 135–145)

## 2019-03-02 LAB — MAGNESIUM: Magnesium: 1.8 mg/dL (ref 1.7–2.4)

## 2019-03-02 LAB — GLUCOSE, CAPILLARY
Glucose-Capillary: 127 mg/dL — ABNORMAL HIGH (ref 70–99)
Glucose-Capillary: 135 mg/dL — ABNORMAL HIGH (ref 70–99)

## 2019-03-02 MED ORDER — FUROSEMIDE 40 MG PO TABS
40.0000 mg | ORAL_TABLET | Freq: Every day | ORAL | Status: DC
  Administered 2019-03-02: 40 mg via ORAL
  Filled 2019-03-02: qty 1

## 2019-03-02 MED ORDER — CANDESARTAN CILEXETIL 16 MG PO TABS: 16.0000 mg | ORAL_TABLET | Freq: Every day | ORAL | 1 refills | Status: DC

## 2019-03-02 MED ORDER — FUROSEMIDE 40 MG PO TABS: 40.0000 mg | ORAL_TABLET | Freq: Every day | ORAL | 0 refills | Status: DC

## 2019-03-02 NOTE — Discharge Summary (Signed)
Physician Discharge Summary  Crystal [name]:[id number] DOB: [birth year] DOA: 02/27/2019  PCP: Glenda Chroman, MD  Admit date: 02/27/2019 Discharge date: 03/02/2019  Admitted From: Home Disposition: Home  Recommendations for Outpatient Follow-up:  1. Follow up with PCP in 1-2 weeks 2. Please follow up on the following pending results:  Home Health: None Equipment/Devices: None  Discharge Condition: Stable CODE STATUS: Full code Diet recommendation: Heart healthy  Brief/Interim Summary: This is a very pleasant 85 year old lady who presented to the hospital with dyspnea.  Please see initial history and physical examination for initial evaluation. It was felt that she was in acute systolic congestive heart failure.  Echocardiogram showed ejection fraction 30 to 35%.  Upon presentation to the emergency room, she was placed on BiPAP but has done well. Today, she is saturating 98% on room air with walking and has been able to be switched to oral Lasix.  Renal function has been improving and she will go home with half the dose of Atacand.  She will follow up with cardiology in due course.  Discharge Diagnoses:  Principal Problem:   CHF exacerbation ([place]) Active Problems:   Diabetes mellitus ([place])   Hyperlipidemia   CAD (coronary artery disease)   Acute on chronic combined systolic and diastolic CHF (congestive heart failure) (HCC)   Acute respiratory failure with hypoxemia [facility])    Discharge Instructions  Discharge Instructions    Diet - low sodium heart healthy   Complete by: As directed    Increase activity slowly   Complete by: As directed      Allergies as of 03/02/2019      Reactions   Bactrim [sulfamethoxazole-trimethoprim] Nausea And Vomiting   Sulfa Antibiotics Nausea And Vomiting   Amlodipine Other (See Comments)   EDEMA   Clonidine Derivatives Other (See Comments)   FATIGUE   Evista [raloxifene Hcl] Other (See Comments)   GERD   Fosamax [alendronate Sodium]  Other (See Comments)   INCREASED GERD    Glipizide Other (See Comments)   PALPITATIONS   Lipitor [atorvastatin Calcium] Other (See Comments)   ACHING   Losartan Other (See Comments)   FATIGUE    Pravastatin Other (See Comments)   ACHING   Azithromycin Rash, Other (See Comments)   FACIAL BURNING       Medication List    TAKE these medications   acetaminophen 500 MG tablet Commonly known as: TYLENOL Take 500 mg by mouth every 6 (six) hours as needed for headache (pain).   Alogliptin Benzoate 25 MG Tabs Take 12.5 mg by mouth daily at 2 PM.   aspirin EC 81 MG tablet Take 81 mg by mouth daily.   CALCIUM 500 + D PO Take 1 tablet by mouth daily.   candesartan 16 MG tablet Commonly known as: ATACAND Take 1 tablet (16 mg total) by mouth daily. What changed:   medication strength  how much to take  when to take this   candesartan 16 MG tablet Commonly known as: ATACAND Take 1 tablet (16 mg total) by mouth daily. What changed: You were already taking a medication with the same name, and this prescription was added. Make sure you understand how and when to take each.   carvedilol 25 MG tablet Commonly known as: COREG Take 1 tablet (25 mg total) by mouth 2 (two) times daily with a meal.   cholecalciferol 25 MCG (1000 UNIT) tablet Commonly known as: VITAMIN D3 Take 1,000 Units by mouth daily with breakfast.   CINNAMON PO  Take 1 capsule by mouth daily with supper.   furosemide 40 MG tablet Commonly known as: LASIX Take 1 tablet (40 mg total) by mouth daily.   isosorbide mononitrate 30 MG 24 hr tablet Commonly known as: IMDUR TAKE 1 TABLET IN THE EVENING. What changed: when to take this   multivitamin with minerals Tabs tablet Take 1 tablet by mouth every morning.   multivitamin-lutein Caps capsule Take 1 capsule by mouth every morning.   nitroGLYCERIN 0.4 MG SL tablet Commonly known as: NITROSTAT Place 1 tablet (0.4 mg total) under the tongue every 5 (five)  minutes x 3 doses as needed for chest pain.   rosuvastatin 5 MG tablet Commonly known as: CRESTOR Take 1 tablet (5 mg total) by mouth daily.   sodium chloride 0.65 % Soln nasal spray Commonly known as: OCEAN Place 1 spray into both nostrils 2 (two) times daily as needed for congestion.   ticagrelor 90 MG Tabs tablet Commonly known as: BRILINTA Take 1 tablet (90 mg total) by mouth 2 (two) times daily.      Follow-up Information    Satira Sark, MD Follow up on 04/15/2019.   Specialty: Cardiology Why: Cardiology Follow-up with Dr. Domenic Polite on 04/15/2019 at 3:20 PM. This replaces your previously scheduled visit with Dr. Harl Bowie.  Contact information: [place] [address] [phone]        Erma Heritage, PA-C Follow up on 03/10/2019.   Specialties: Physician Assistant, Cardiology Why: Cardiology Hosputal Follow-up on 03/10/2019 at 2:30 PM. APPOINTMENT WILL BE AT OUR [place] OFFICE LOCATION.  Contact information: [address] [place] [address] [phone]            Consultations:  Cardiology   Procedures/Studies: DG CHEST PORT 1 VIEW  Result Date: 02/28/2019 CLINICAL DATA:  Shortness of breath EXAM: PORTABLE CHEST 1 VIEW COMPARISON:  02/27/2019 FINDINGS: Improved aeration with persistent pulmonary vascular congestion. No significant pleural effusion. No pneumothorax. Stable cardiomediastinal contours. IMPRESSION: Improved lung aeration since 02/27/2019 persistent pulmonary vascular congestion. Electronically Signed   By: Macy Mis M.D.   On: 02/28/2019 11:45   DG Chest Portable 1 View  Result Date: 02/27/2019 CLINICAL DATA:  Shortness of breath EXAM: PORTABLE CHEST 1 VIEW COMPARISON:  01/30/2019 FINDINGS: Cardiac shadow is within normal limits. Aortic calcifications are again seen. Vascular congestion is noted with evidence of bilateral parenchymal edema. No sizable effusion is seen. No bony abnormality is noted. IMPRESSION:  Changes consistent with CHF. Electronically Signed   By: Inez Catalina M.D.   On: 02/27/2019 20:41   ECHOCARDIOGRAM COMPLETE  Result Date: 01/31/2019   ECHOCARDIOGRAM REPORT   Patient Name:   Crystal [name] Date of Exam: 01/31/2019 Medical Rec #:  [id number]        Height:       65.0 in Accession #:    [id number]       Weight:       140.0 lb Date of Birth:  09-Nov-1934        BSA:          1.70 m Patient Age:    85 years         BP:           107/72 mmHg Patient Gender: F                HR:           73 bpm. Exam Location:  Inpatient Procedure: 2D Echo, Color Doppler  and Cardiac Doppler Indications:    I25.110 Atherosclerotic heart disease of native coronary artery                 with unstable angina pectoris  History:        Patient has no prior history of Echocardiogram examinations.                 Acute MI; Risk Factors:Hypertension, Diabetes and Dyslipidemia.                 Patient is post stent placement.  Sonographer:    Roseanna Rainbow RDCS Referring Phys: [place]  Sonographer Comments: Image acquisition challenging due to respiratory motion. IMPRESSIONS  1. Left ventricular ejection fraction, by visual estimation, is 30 to 35%. The left ventricle has moderately decreased function. There is no left ventricular hypertrophy.  2. Severe akinesis of the left ventricular, apical apical segment.  3. Left ventricular diastolic parameters are consistent with Grade II diastolic dysfunction (pseudonormalization).  4. The left ventricle demonstrates regional wall motion abnormalities.  5. Global right ventricle has normal systolic function.The right ventricular size is normal. No increase in right ventricular wall thickness.  6. Left atrial size was normal.  7. Right atrial size was normal.  8. The mitral valve is grossly normal. Mild mitral valve regurgitation.  9. The tricuspid valve is normal in structure. 10. The aortic valve is tricuspid. Aortic valve regurgitation is trivial. No evidence of aortic  valve sclerosis or stenosis. 11. The pulmonic valve was normal in structure. Pulmonic valve regurgitation is not visualized. 12. The atrial septum is grossly normal. FINDINGS  Left Ventricle: Left ventricular ejection fraction, by visual estimation, is 30 to 35%. The left ventricle has moderately decreased function. Severe akinesis of the left ventricular, apical apical segment. The left ventricle demonstrates regional wall motion abnormalities. There is no left ventricular hypertrophy. Left ventricular diastolic parameters are consistent with Grade II diastolic dysfunction (pseudonormalization). Right Ventricle: The right ventricular size is normal. No increase in right ventricular wall thickness. Global RV systolic function is has normal systolic function. Left Atrium: Left atrial size was normal in size. Right Atrium: Right atrial size was normal in size Pericardium: There is no evidence of pericardial effusion. Mitral Valve: The mitral valve is grossly normal. Mild mitral valve regurgitation. Tricuspid Valve: The tricuspid valve is normal in structure. Tricuspid valve regurgitation is mild. Aortic Valve: The aortic valve is tricuspid. . There is mild thickening of the aortic valve. Aortic valve regurgitation is trivial. The aortic valve is structurally normal, with no evidence of sclerosis or stenosis. There is mild thickening of the aortic  valve. Pulmonic Valve: The pulmonic valve was normal in structure. Pulmonic valve regurgitation is not visualized. Pulmonic regurgitation is not visualized. Aorta: The aortic root and ascending aorta are structurally normal, with no evidence of dilitation. IAS/Shunts: The atrial septum is grossly normal.  LEFT VENTRICLE PLAX 2D LVIDd:         4.21 cm       Diastology LVIDs:         2.68 cm       LV e' lateral:   4.03 cm/s LV PW:         1.30 cm       LV E/e' lateral: 30.8 LV IVS:        1.40 cm       LV e' medial:    4.13 cm/s LVOT diam:     1.80 cm  LV E/e' medial:   30.0 LV SV:         53 ml LV SV Index:   30.73 LVOT Area:     2.54 cm  LV Volumes (MOD) LV area d, A2C:    26.90 cm LV area d, A4C:    25.80 cm LV area s, A2C:    19.00 cm LV area s, A4C:    21.40 cm LV major d, A2C:   8.06 cm LV major d, A4C:   7.92 cm LV major s, A2C:   8.10 cm LV major s, A4C:   8.07 cm LV vol d, MOD A2C: 73.3 ml LV vol d, MOD A4C: 69.2 ml LV vol s, MOD A2C: 37.3 ml LV vol s, MOD A4C: 45.4 ml LV SV MOD A2C:     36.0 ml LV SV MOD A4C:     69.2 ml LV SV MOD BP:      31.4 ml RIGHT VENTRICLE             IVC RV S prime:     10.40 cm/s  IVC diam: 1.60 cm TAPSE (M-mode): 2.0 cm LEFT ATRIUM             Index       RIGHT ATRIUM           Index LA diam:        2.40 cm 1.41 cm/m  RA Area:     13.00 cm LA Vol (A2C):   44.2 ml 26.00 ml/m RA Volume:   31.50 ml  18.53 ml/m LA Vol (A4C):   36.2 ml 21.30 ml/m LA Biplane Vol: 40.3 ml 23.71 ml/m  AORTIC VALVE             PULMONIC VALVE LVOT Vmax:   106.00 cm/s PR End Diast Vel: 1.71 msec LVOT Vmean:  69.600 cm/s LVOT VTI:    0.225 m  AORTA Ao Root diam: 2.85 cm Ao Asc diam:  2.80 cm MITRAL VALVE MV Area (PHT): 2.60 cm              SHUNTS MV PHT:        84.68 msec            Systemic VTI:  0.22 m MV Decel Time: 292 msec              Systemic Diam: 1.80 cm MV E velocity: 124.00 cm/s 103 cm/s MV A velocity: 137.00 cm/s 70.3 cm/s MV E/A ratio:  0.91        1.5  Mertie Moores MD Electronically signed by Mertie Moores MD Signature Date/Time: 01/31/2019/1:46:41 PM    Final        Subjective:  She feels well and not dyspneic at rest. Discharge Exam: Vitals:   03/02/19 0427 03/02/19 0457 03/02/19 0843 03/02/19 0958  BP:  140/71    Pulse:  80  79  Resp:  17    Temp:  98.5 F (36.9 C)    TempSrc:  Oral    SpO2:  98% 93% 98%  Weight: 64.8 kg     Height:         She looks good for her age.  Oxygen saturation 98% on room air upon walking.  Hemodynamically stable.  Lung fields are entirely clear.  She is alert and orientated.  There is no  clinical evidence of decompensated heart failure.   The results of significant diagnostics from this hospitalization (including imaging, microbiology, ancillary and laboratory) are listed  below for reference.     Microbiology: Recent Results (from the past 240 hour(s))  Respiratory Panel by RT PCR (Flu A&B, Covid) - Nasopharyngeal Swab     Status: None   Collection Time: 02/27/19  7:49 PM   Specimen: Nasopharyngeal Swab  Result Value Ref Range Status   SARS Coronavirus 2 by RT PCR NEGATIVE NEGATIVE Final    Comment: (NOTE) SARS-CoV-2 target nucleic acids are NOT DETECTED. The SARS-CoV-2 RNA is generally detectable in upper respiratoy specimens during the acute phase of infection. The lowest concentration of SARS-CoV-2 viral copies this assay can detect is 131 copies/mL. A negative result does not preclude SARS-Cov-2 infection and should not be used as the sole basis for treatment or other patient management decisions. A negative result may occur with  improper specimen collection/handling, submission of specimen other than nasopharyngeal swab, presence of viral mutation(s) within the areas targeted by this assay, and inadequate number of viral copies (<131 copies/mL). A negative result must be combined with clinical observations, patient history, and epidemiological information. The expected result is Negative. Fact Sheet for Patients:  PinkCheek.be Fact Sheet for Healthcare Providers:  GravelBags.it This test is not yet ap proved or cleared by the [place] FDA and  has been authorized for detection and/or diagnosis of SARS-CoV-2 by FDA under an Emergency Use Authorization (EUA). This EUA will remain  in effect (meaning this test can be used) for the duration of the COVID-19 declaration under Section 564(b)(1) of the Act, 21 U.S.C. section 360bbb-3(b)(1), unless the authorization is terminated or revoked sooner.     Influenza A by PCR NEGATIVE NEGATIVE Final   Influenza B by PCR NEGATIVE NEGATIVE Final    Comment: (NOTE) The Xpert Xpress SARS-CoV-2/FLU/RSV assay is intended as an aid in  the diagnosis of influenza from Nasopharyngeal swab specimens and  should not be used as a sole basis for treatment. Nasal washings and  aspirates are unacceptable for Xpert Xpress SARS-CoV-2/FLU/RSV  testing. Fact Sheet for Patients: PinkCheek.be Fact Sheet for Healthcare Providers: GravelBags.it This test is not yet approved or cleared by the [place] FDA and  has been authorized for detection and/or diagnosis of SARS-CoV-2 by  FDA under an Emergency Use Authorization (EUA). This EUA will remain  in effect (meaning this test can be used) for the duration of the  Covid-19 declaration under Section 564(b)(1) of the Act, 21  U.S.C. section 360bbb-3(b)(1), unless the authorization is  terminated or revoked. Performed at [facility], [address], [place], [place] [address]      Labs: BNP (last 3 results) Recent Labs    02/27/19 1944  BNP 1,157.2*   Basic Metabolic Panel: Recent Labs  Lab 02/27/19 1944 02/28/19 0532 03/01/19 0627 03/02/19 0615  NA 125* 128* 132* 135  K 5.4* 3.7 3.3* 3.5  CL 96* 93* 94* 93*  CO2 18* 23 26 28   GLUCOSE 187* 156* 108* 115*  BUN 21 25* 28* 32*  CREATININE 1.80* 1.63* 1.31* 1.23*  CALCIUM 8.4* 8.5* 8.7* 9.3  MG  --   --  1.8 1.8  PHOS  --   --  3.6  --    Liver Function Tests: Recent Labs  Lab 02/27/19 1944 03/01/19 0627  AST 45* 25  ALT 26 35  ALKPHOS 74 67  BILITOT 1.2 0.7  PROT 6.6 5.8*  ALBUMIN 3.6 3.0*   No results for input(s): LIPASE, AMYLASE in the last 168 hours. No results for input(s): AMMONIA in the last 168 hours.  CBC: Recent Labs  Lab 02/27/19 1944 02/28/19 0532 03/01/19 0627  WBC 12.8* 11.3* 9.6  NEUTROABS 9.1*  --  6.1  HGB 10.9* 11.1* 10.6*  HCT 33.4* 34.1* 32.6*  MCV  84.3 84.6 82.7  PLT 303 258 294   Cardiac Enzymes: No results for input(s): CKTOTAL, CKMB, CKMBINDEX, TROPONINI in the last 168 hours. BNP: Invalid input(s): POCBNP CBG: Recent Labs  Lab 03/01/19 0736 03/01/19 1115 03/01/19 1617 03/01/19 2051 03/02/19 0737  GLUCAP 108* 157* 85 123* 127*   D-Dimer No results for input(s): DDIMER in the last 72 hours. Hgb A1c Recent Labs    02/28/19 0532  HGBA1C 6.1*   Lipid Profile No results for input(s): CHOL, HDL, LDLCALC, TRIG, CHOLHDL, LDLDIRECT in the last 72 hours. Thyroid function studies No results for input(s): TSH, T4TOTAL, T3FREE, THYROIDAB in the last 72 hours.  Invalid input(s): FREET3 Anemia work up No results for input(s): VITAMINB12, FOLATE, FERRITIN, TIBC, IRON, RETICCTPCT in the last 72 hours. Urinalysis No results found for: COLORURINE, APPEARANCEUR, [place], [place], [place], [place], [place], [place], PROTEINUR, UROBILINOGEN, NITRITE, LEUKOCYTESUR Sepsis Labs Invalid input(s): PROCALCITONIN,  WBC,  LACTICIDVEN Microbiology Recent Results (from the past 240 hour(s))  Respiratory Panel by RT PCR (Flu A&B, Covid) - Nasopharyngeal Swab     Status: None   Collection Time: 02/27/19  7:49 PM   Specimen: Nasopharyngeal Swab  Result Value Ref Range Status   SARS Coronavirus 2 by RT PCR NEGATIVE NEGATIVE Final    Comment: (NOTE) SARS-CoV-2 target nucleic acids are NOT DETECTED. The SARS-CoV-2 RNA is generally detectable in upper respiratoy specimens during the acute phase of infection. The lowest concentration of SARS-CoV-2 viral copies this assay can detect is 131 copies/mL. A negative result does not preclude SARS-Cov-2 infection and should not be used as the sole basis for treatment or other patient management decisions. A negative result may occur with  improper specimen collection/handling, submission of specimen other than nasopharyngeal swab, presence of viral mutation(s) within the areas targeted by this  assay, and inadequate number of viral copies (<131 copies/mL). A negative result must be combined with clinical observations, patient history, and epidemiological information. The expected result is Negative. Fact Sheet for Patients:  PinkCheek.be Fact Sheet for Healthcare Providers:  GravelBags.it This test is not yet ap proved or cleared by the [place] FDA and  has been authorized for detection and/or diagnosis of SARS-CoV-2 by FDA under an Emergency Use Authorization (EUA). This EUA will remain  in effect (meaning this test can be used) for the duration of the COVID-19 declaration under Section 564(b)(1) of the Act, 21 U.S.C. section 360bbb-3(b)(1), unless the authorization is terminated or revoked sooner.    Influenza A by PCR NEGATIVE NEGATIVE Final   Influenza B by PCR NEGATIVE NEGATIVE Final    Comment: (NOTE) The Xpert Xpress SARS-CoV-2/FLU/RSV assay is intended as an aid in  the diagnosis of influenza from Nasopharyngeal swab specimens and  should not be used as a sole basis for treatment. Nasal washings and  aspirates are unacceptable for Xpert Xpress SARS-CoV-2/FLU/RSV  testing. Fact Sheet for Patients: PinkCheek.be Fact Sheet for Healthcare Providers: GravelBags.it This test is not yet approved or cleared by the [place] FDA and  has been authorized for detection and/or diagnosis of SARS-CoV-2 by  FDA under an Emergency Use Authorization (EUA). This EUA will remain  in effect (meaning this test can be used) for the duration of the  Covid-19 declaration under Section 564(b)(1) of the Act, 21  U.S.C.  section 360bbb-3(b)(1), unless the authorization is  terminated or revoked. Performed at [facility], [address] [place], [place] [address]      Time coordinating discharge: 40 minutes.  SIGNED:   Doree Albee, MD  [facility] 03/02/2019, 11:24 AM   If 7PM-7AM, please contact night-coverage [email or web address]

## 2019-03-02 NOTE — Progress Notes (Signed)
IV, tele removed. D/C instructions reviewed with patient, verbalized understanding. Patient to be transported to private vehicle via wheelchair. Will continue to monitor.

## 2019-03-02 NOTE — Progress Notes (Addendum)
Progress Note  Patient Name: Crystal [name] Date of Encounter: 03/02/2019  Primary Cardiologist: Lauree Chandler, MD --> wishes to follow-up in [facility] and was previously followed by Dr. Domenic Polite  Subjective   Breathing back to baseline. Still wearing 2L [place] and was not on oxygen supplementation prior to admission. No chest pain or palpitations.   Inpatient Medications    Scheduled Meds: . aspirin EC  81 mg Oral Daily  . calcium-vitamin D   Oral Daily  . carvedilol  25 mg Oral BID WC  . furosemide  40 mg Intravenous Q12H  . heparin  5,000 Units Subcutaneous Q8H  . insulin aspart  0-9 Units Subcutaneous TID WC  . isosorbide mononitrate  30 mg Oral Daily  . multivitamin with minerals  1 tablet Oral q morning - 10a  . rosuvastatin  5 mg Oral Daily  . sodium chloride flush  3 mL Intravenous Q12H  . ticagrelor  90 mg Oral BID  . cholecalciferol  1,000 Units Oral Q breakfast   Continuous Infusions: . sodium chloride    . nitroGLYCERIN Stopped (02/28/19 1011)   PRN Meds: sodium chloride, acetaminophen **OR** acetaminophen, bisacodyl, meclizine, ondansetron **OR** ondansetron (ZOFRAN) IV, polyethylene glycol, sodium chloride flush, traMADol   Vital Signs    Vitals:   03/01/19 1543 03/01/19 2035 03/02/19 0427 03/02/19 0457  BP:  (!) 126/52  140/71  Pulse: 75 78  80  Resp:  20  17  Temp:  (!) 97.4 F (36.3 C)  98.5 F (36.9 C)  TempSrc:  Oral  Oral  SpO2: 98% 99%  98%  Weight:   64.8 kg   Height:        Intake/Output Summary (Last 24 hours) at 03/02/2019 0743 Last data filed at 03/02/2019 0500 Gross per 24 hour  Intake 480 ml  Output 2450 ml  Net -1970 ml    Last 3 Weights 03/02/2019 03/01/2019 02/28/2019  Weight (lbs) 142 lb 13.7 oz 146 lb 2.6 oz 144 lb 13.5 oz  Weight (kg) 64.8 kg 66.3 kg 65.7 kg      Telemetry    NSR, HR in 70's to 90's with occasional PVC's- Personally Reviewed  ECG    No new tracings.   Physical Exam   General: Well developed, well  nourished, female appearing in no acute distress. Head: Normocephalic, atraumatic.  Neck: Supple without bruits, JVD not elevated. Lungs:  Resp regular and unlabored, CTA without wheezing or rales. Heart: RRR, S1, S2, no S3, S4, or murmur; no rub. Abdomen: Soft, non-tender, non-distended with normoactive bowel sounds. No hepatomegaly. No rebound/guarding. No obvious abdominal masses. Extremities: No clubbing, cyanosis, or lower extremity edema. Distal pedal pulses are 2+ bilaterally. Neuro: Alert and oriented X 3. Moves all extremities spontaneously. Psych: Normal affect.  Labs    Chemistry Recent Labs  Lab 02/27/19 1944 02/27/19 1944 02/28/19 0532 03/01/19 0627 03/02/19 0615  NA 125*   < > 128* 132* 135  K 5.4*   < > 3.7 3.3* 3.5  CL 96*   < > 93* 94* 93*  CO2 18*   < > 23 26 28   GLUCOSE 187*   < > 156* 108* 115*  BUN 21   < > 25* 28* 32*  CREATININE 1.80*   < > 1.63* 1.31* 1.23*  CALCIUM 8.4*   < > 8.5* 8.7* 9.3  PROT 6.6  --   --  5.8*  --   ALBUMIN 3.6  --   --  3.0*  --  AST 45*  --   --  25  --   ALT 26  --   --  35  --   ALKPHOS 74  --   --  67  --   BILITOT 1.2  --   --  0.7  --   GFRNONAA 26*   < > 29* 38* 41*  GFRAA 30*   < > 34* 44* 47*  ANIONGAP 11   < > 12 12 14    < > = values in this interval not displayed.     Hematology Recent Labs  Lab 02/27/19 1944 02/28/19 0532 03/01/19 0627  WBC 12.8* 11.3* 9.6  RBC 3.96 4.03 3.94  HGB 10.9* 11.1* 10.6*  HCT 33.4* 34.1* 32.6*  MCV 84.3 84.6 82.7  MCH 27.5 27.5 26.9  MCHC 32.6 32.6 32.5  RDW 14.1 13.9 13.7  PLT 303 258 294    Cardiac EnzymesNo results for input(s): TROPONINI in the last 168 hours. No results for input(s): TROPIPOC in the last 168 hours.   BNP Recent Labs  Lab 02/27/19 1944  BNP 2,582.0*     DDimer No results for input(s): DDIMER in the last 168 hours.   Radiology    DG CHEST PORT 1 VIEW  Result Date: 02/28/2019 CLINICAL DATA:  Shortness of breath EXAM: PORTABLE CHEST 1 VIEW  COMPARISON:  02/27/2019 FINDINGS: Improved aeration with persistent pulmonary vascular congestion. No significant pleural effusion. No pneumothorax. Stable cardiomediastinal contours. IMPRESSION: Improved lung aeration since 02/27/2019 persistent pulmonary vascular congestion. Electronically Signed   By: Macy Mis M.D.   On: 02/28/2019 11:45    Cardiac Studies   Cardiac Catheterization: 01/2019  Prox RCA lesion is 60% stenosed.  Mid RCA lesion is 40% stenosed.  Prox Cx to Mid Cx lesion is 20% stenosed.  Mid LAD lesion is 99% stenosed.  A drug-eluting stent was successfully placed using a SYNERGY XD 3.0X28.  Post intervention, there is a 0% residual stenosis.   1. Severe stenosis mid LAD 2. Successful PTCA/DES x 1 mid LAD 3. Mild non-obstructive disease in the Circumflex 4. Moderate non-obstructive disease in the mid RCA  Recommendations: Will admit to the ICU. DAPT with ASA and Brilinta for one year. Continue home beta blocker. Echo in am. She is statin intolerant. Fast track discharge, possibly home tomorrow if stable.   Echocardiogram: 01/2019 IMPRESSIONS    1. Left ventricular ejection fraction, by visual estimation, is 30 to  35%. The left ventricle has moderately decreased function. There is no  left ventricular hypertrophy.  2. Severe akinesis of the left ventricular, apical apical segment.  3. Left ventricular diastolic parameters are consistent with Grade II  diastolic dysfunction (pseudonormalization).  4. The left ventricle demonstrates regional wall motion abnormalities.  5. Global right ventricle has normal systolic function.The right  ventricular size is normal. No increase in right ventricular wall  thickness.  6. Left atrial size was normal.  7. Right atrial size was normal.  8. The mitral valve is grossly normal. Mild mitral valve regurgitation.  9. The tricuspid valve is normal in structure.  10. The aortic valve is tricuspid. Aortic valve  regurgitation is trivial.  No evidence of aortic valve sclerosis or stenosis.  11. The pulmonic valve was normal in structure. Pulmonic valve  regurgitation is not visualized.  12. The atrial septum is grossly normal.   Patient Profile     85 y.o. female w/ PMH of CAD (s/p recent STEMI in 01/2019 with DES to mid-LAD), chronic systolic  CHF/ICM (EF 30-35% by echo in 01/2019), HTN, HLD (statin intolerant), renal artery stenosis, and history of breast cancer (s/p R mastectomy) who presented to [facility] ED on 02/27/2019 for evaluation of worsening dyspnea. Currently admitted for an acute CHF exacerbation.   Assessment & Plan    1. Acute Hypoxic Respiratory Failure in the setting of Acute on Chronic Combined CHF Exacerbation - she has a known reduced EF of 30-35% by echo in 01/2019 with plans for a repeat echo in 3 months following optimal medical therapy and recent revascularization.  - was not on diuretic therapy PTA and BNP was elevated to 2582 and CXR consistent with CHF. She has been receiving IV Lasix 40mg  BID and has responded well with a recorded output of -5.1L thus far (-1.9L yesterday). She appears euvolemic by examination but has remained on oxygen supplementation. Will ask patient's nurse to check ambulatory O2 saturations later today. If stable, would anticipate she can be switched to PO Lasix and possible discharge later today. If desaturating, would continue with IV dosing. She reports scales at home showed the same weight for weeks without changing and says they are very old. Will reach out to the Cardiovascular Service Line social worker to see if scales can be mailed to the patient.  - continue Coreg and plan to add back Candesartan at the time of discharge as outlined below. Previously had hyperkalemia with Spironolactone and Entresto is cost-prohibitive at this time.   2. CAD - recent STEMI in 01/2019 with DES to mid-LAD as outlined above. EKG on admission showed TWI along anterior  and lateral leads which was thought to be most consistent with evolving changes from her recent MI.  - HS Troponin values peaked at 189 this admission and felt to be most consistent with demand ischemia in the setting of her CHF exacerbation. Peaked at 6918 during prior admission.  - continue ASA, Brilinta, BB and statin therapy. She does report her co-pay for Kary Kos is unaffordable. Will need to enroll in patient assistance as an outpatient or consider switching to Plavix.   3. HTN - BP has been stable at 126/52 - 140/71 within the past 24 hours. Continue Coreg and Imdur. On Candesartan 32mg  daily as an outpatient but not on formulary here and she has been intolerant to other ARB's in the past. Given BP trend, would recommend restarting at 16mg  daily upon discharge and can titrate as an outpatient.   4. HLD - LDL elevated to 229 on most recent check. She has been started on Crestor 5mg  daily by the [facility] and is overall tolerating this well without reported side-effects.   5. Stage 3 CKD - baseline creatinine close to 1.2. Peaked at 1.80 on admission and back to baseline today, at 1.23.    For questions or updates, please contact [place] Please consult [email or web address] for contact info under Cardiology/STEMI.   Signed, Erma Heritage , PA-C 7:43 AM 03/02/2019 Pager: [phone]  The patient was seen and examined, and I agree with the history, physical exam, assessment and plan as documented above.  Her nurse was present at the time of my encounter.  I spoke to her about ambulating the patient and weaning her off oxygen.  If so, she can be switched to oral Lasix today.  She is symptomatically improved and physical exam is consistent with euvolemic status.  We spoke about medical therapy adherence.  As renal function has improved candesartan can also be resumed. Hopefully, she will be able  to be discharged later today.  Kate Sable, MD, [facility]  03/02/2019 8:59  AM

## 2019-03-03 ENCOUNTER — Telehealth: Payer: Self-pay | Admitting: Licensed Clinical Social Worker

## 2019-03-03 NOTE — Telephone Encounter (Signed)
CSW referred to assist patient with obtaining a scale. CSW contacted patient to inform scale will be delivered to home. Patient grateful for support and assistance. CSW available as needed. Jackie Selim Durden, LCSW, CCSW-MCS [phone]  

## 2019-03-04 ENCOUNTER — Other Ambulatory Visit: Payer: Self-pay

## 2019-03-04 ENCOUNTER — Encounter: Payer: Self-pay | Admitting: *Deleted

## 2019-03-04 ENCOUNTER — Other Ambulatory Visit: Payer: Self-pay | Admitting: *Deleted

## 2019-03-04 DIAGNOSIS — I509 Heart failure, unspecified: Secondary | ICD-10-CM

## 2019-03-04 DIAGNOSIS — I5043 Acute on chronic combined systolic (congestive) and diastolic (congestive) heart failure: Secondary | ICD-10-CM

## 2019-03-04 NOTE — Patient Outreach (Signed)
Referral received from hospital liason, pt hospitalized 1/31-03/02/19 with diagnoses acute pulmonary edema, pt with history CHF, HLD, CAD, DM- AIC 6.4, HTN.  Outreach call to pt for screening and transition of care week 1, spoke with pt, HIPAA verified, RN CM reviewed discharge instructions and medications with pt, pt feels affording Brilinta is going to be a problematic, RN CM placed order for [facility] pharmacist for assistance.  Pt has primary care provider appointment on 03/07/19 and verbalizes cardiology upcoming appointments.  Pt lives alone and has assistance of her nephew and neighbors, pt still drives and states she is independent in all aspects of her care.  Pt feels diabetes is under good control,  CBG today 111.  Pt feels working towards managing HF is priority for her.  Outpatient Encounter Medications as of 03/04/2019  Medication Sig Note  . acetaminophen (TYLENOL) 500 MG tablet Take 500 mg by mouth every 6 (six) hours as needed for headache (pain).   . Alogliptin Benzoate 25 MG TABS Take 12.5 mg by mouth daily at 2 PM. 02/27/2019: 30ds last filled 02/22/2019  . aspirin EC 81 MG tablet Take 81 mg by mouth daily.   . Calcium Carbonate-Vitamin D (CALCIUM 500 + D PO) Take 1 tablet by mouth daily.    . candesartan (ATACAND) 16 MG tablet Take 1 tablet (16 mg total) by mouth daily.   . carvedilol (COREG) 25 MG tablet Take 1 tablet (25 mg total) by mouth 2 (two) times daily with a meal. 02/27/2019: 30ds last filled on 02/02/2019  . cholecalciferol (VITAMIN D3) 25 MCG (1000 UT) tablet Take 1,000 Units by mouth daily with breakfast.   . CINNAMON PO Take 1 capsule by mouth daily with supper.   . furosemide (LASIX) 40 MG tablet Take 1 tablet (40 mg total) by mouth daily.   . isosorbide mononitrate (IMDUR) 30 MG 24 hr tablet TAKE 1 TABLET IN THE EVENING. (Patient taking differently: Take 30 mg by mouth at bedtime. ) 02/27/2019: 90ds last filled on 12/06/2018  . Multiple Vitamin (MULTIVITAMIN WITH MINERALS) TABS  tablet Take 1 tablet by mouth every morning.   . multivitamin-lutein (OCUVITE-LUTEIN) CAPS capsule Take 1 capsule by mouth every morning.    . nitroGLYCERIN (NITROSTAT) 0.4 MG SL tablet Place 1 tablet (0.4 mg total) under the tongue every 5 (five) minutes x 3 doses as needed for chest pain.   . rosuvastatin (CRESTOR) 5 MG tablet Take 1 tablet (5 mg total) by mouth daily. 02/27/2019: 90ds last filled on 02/21/2019  . sodium chloride (OCEAN) 0.65 % SOLN nasal spray Place 1 spray into both nostrils 2 (two) times daily as needed for congestion.    . ticagrelor (BRILINTA) 90 MG TABS tablet Take 1 tablet (90 mg total) by mouth 2 (two) times daily. 02/27/2019: 30ds last filled on 02/02/2019  . candesartan (ATACAND) 16 MG tablet Take 1 tablet (16 mg total) by mouth daily. (Patient not taking: Reported on 03/04/2019)    No facility-administered encounter medications on file as of 03/04/2019.   THN CM Care Plan Problem One     Most Recent Value  Care Plan Problem One  Knowledge deficit related to CHF  Role Documenting the Problem One  Care Management Coordinator  Care Plan for Problem One  Active  THN Long Term Goal   Pt will verbalize, demonstrate improved self care for CHF within 60 days  THN Long Term Goal Start Date  03/04/19  Interventions for Problem One Long Term Goal  RN CM established  plan of care with pt, mailed successful outreach letter to pt home including 24 hour nurse line magnet, [facility] calendar and low sodium poster,  RN CM faxed today's note with barrier letter to primary care MD  [facility] CM Short Term Goal #1   Pt will verbalize CHF action plan/ zones within 30 days  THN CM Short Term Goal #1 Start Date  03/04/19  Interventions for Short Term Goal #1  RN CM reviewed HF action plan with pt (she opened Living Better with HF booklet to review),  RN CM placed emphasis on yellow zone  THN CM Short Term Goal #2   Pt will weigh daily and record within 30 days  THN CM Short Term Goal #2 Start Date  03/04/19   Interventions for Short Term Goal #2  Pt did receive new scale today and plans to start weighing tomorrow morning, pt plans to record weight daily,  RN CM ask pt to weigh on hard surface at same time daily, before eating and dressing and after emptying her bladder  THN CM Short Term Goal #3  Pt will verbalize foods high in sodium to limit/ avoid within 30 days  THN CM Short Term Goal #3 Start Date  03/04/19  Interventions for Short Tern Goal #3  RN CM mailed pt copy of low sodium poster, reviewed foods high in sodium to limit and read label for sodium content     PLAN Outreach pt next week to finish required assessments Continue weekly transition of care calls  Jacqlyn Larsen [facility], [place] Coordinator [phone]

## 2019-03-07 ENCOUNTER — Other Ambulatory Visit: Payer: Self-pay | Admitting: *Deleted

## 2019-03-07 ENCOUNTER — Other Ambulatory Visit: Payer: Self-pay | Admitting: Pharmacist

## 2019-03-07 DIAGNOSIS — I5022 Chronic systolic (congestive) heart failure: Secondary | ICD-10-CM | POA: Diagnosis not present

## 2019-03-07 DIAGNOSIS — I25119 Atherosclerotic heart disease of native coronary artery with unspecified angina pectoris: Secondary | ICD-10-CM | POA: Diagnosis not present

## 2019-03-07 DIAGNOSIS — Z299 Encounter for prophylactic measures, unspecified: Secondary | ICD-10-CM | POA: Diagnosis not present

## 2019-03-07 DIAGNOSIS — I10 Essential (primary) hypertension: Secondary | ICD-10-CM | POA: Diagnosis not present

## 2019-03-07 DIAGNOSIS — E1165 Type 2 diabetes mellitus with hyperglycemia: Secondary | ICD-10-CM | POA: Diagnosis not present

## 2019-03-07 DIAGNOSIS — Z6820 Body mass index (BMI) 20.0-20.9, adult: Secondary | ICD-10-CM | POA: Diagnosis not present

## 2019-03-07 DIAGNOSIS — G510 Bell's palsy: Secondary | ICD-10-CM | POA: Diagnosis not present

## 2019-03-07 NOTE — Patient Outreach (Signed)
Outreach call to pt for completion of required assessments, spoke with pt, HIPAA verified, pt reports she is doing well today, continues checking BP, CBG and weight daily and recording all.  Pt to see primary care provider today.  Pt reports referral has been placed for outpatient PT at [facility].  No new concerns voiced. RN CM faxed today's note to primary care provider.  PLAN Continue weekly transition of care calls  Jacqlyn Larsen [facility], [place] Coordinator [phone]

## 2019-03-07 NOTE — Patient Outreach (Signed)
[facility]) Care Management  [place]   03/07/2019  Crystal [name] 01/16/1935 [id number]  Reason for referral: Medication Assistance with Brilinta  Referral source: [facility] RN Current insurance: [facility] Supplement  PMHx includes but not limited to:  CAD s/p recent DES 1/21 to mid LAD, T2DM, CHF with recent hospitalization for CHF exacerbation.  Per notes, statin intolerant.  Plans for DAPT with ASA / Brilinta x 1 year.    Outreach:  Successful telephone call with patient.  HIPAA identifiers verified.   Subjective:  Patient reports she received Brilinta for 1 month at no charge using a coupon card, but was told by pharmacy that regular co-pay will be $200 / month.  She does not have a Medicare Part D / Advantage plan and reports her usual monthly costs for other medications are very low.  She has f/u appt (3 month check-up) with cardiologist next month.    Objective: The ASCVD Risk score [name], et al., 2013) failed to calculate for the following reasons:   The 2013 ASCVD risk score is only valid for ages 57 to 65   The patient has a prior MI or stroke diagnosis  Lab Results  Component Value Date   CREATININE 1.23 (H) 03/02/2019   CREATININE 1.31 (H) 03/01/2019   CREATININE 1.63 (H) 02/28/2019    Lab Results  Component Value Date   HGBA1C 6.1 (H) 02/28/2019    Lipid Panel     Component Value Date/Time   CHOL 331 (H) 01/30/2019 1727   TRIG 169 (H) 01/30/2019 1727   HDL 68 01/30/2019 1727   CHOLHDL 4.9 01/30/2019 1727   VLDL 34 01/30/2019 1727   LDLCALC 229 (H) 01/30/2019 1727    BP Readings from Last 3 Encounters:  03/02/19 (!) 129/55  02/16/19 (!) 152/72  02/02/19 (!) 140/54    Allergies  Allergen Reactions  . Bactrim [Sulfamethoxazole-Trimethoprim] Nausea And Vomiting  . Sulfa Antibiotics Nausea And Vomiting  . Amlodipine Other (See Comments)    EDEMA  . Clonidine Derivatives Other (See Comments)    FATIGUE  .  Evista [Raloxifene Hcl] Other (See Comments)    GERD  . Fosamax [Alendronate Sodium] Other (See Comments)    INCREASED GERD   . Glipizide Other (See Comments)    PALPITATIONS  . Lipitor [Atorvastatin Calcium] Other (See Comments)    ACHING  . Losartan Other (See Comments)    FATIGUE   . Pravastatin Other (See Comments)    ACHING  . Azithromycin Rash and Other (See Comments)    FACIAL BURNING     Medications Reviewed Today    Reviewed by Kassie Mends, RN (Registered Nurse) on 03/04/19 at 1633  Med List Status: <None>  Medication Order Taking? Sig Documenting Provider Last Dose Status Informant  acetaminophen (TYLENOL) 500 MG tablet [id number] Yes Take 500 mg by mouth every 6 (six) hours as needed for headache (pain). [provider] Taking Active Self  Alogliptin Benzoate 25 MG TABS [id number] Yes Take 12.5 mg by mouth daily at 2 PM. [provider] Taking Active Self           Med Note Trinidad Curet, Coralee Pesa Feb 27, 2019 10:36 PM) 30ds last filled 02/22/2019  aspirin EC 81 MG tablet [id number] Yes Take 81 mg by mouth daily. [provider] Taking Active Self           Med Note Trinidad Curet, DANA K   Sun Feb 27, 2019 10:36 PM)    Calcium Carbonate-Vitamin D (CALCIUM 500 + D PO) [id number] Yes Take 1 tablet by mouth daily.  [provider] Taking Active Self  candesartan (ATACAND) 16 MG tablet [id number] Yes Take 1 tablet (16 mg total) by mouth daily. Doree Albee, MD Taking Active   candesartan (ATACAND) 16 MG tablet [id number] No Take 1 tablet (16 mg total) by mouth daily.  Patient not taking: Reported on 03/04/2019   Doree Albee, MD Not Taking Active   carvedilol (COREG) 25 MG tablet [id number] Yes Take 1 tablet (25 mg total) by mouth 2 (two) times daily with a meal. Cheryln Manly, NP Taking Active Self           Med Note Trinidad Curet, Coralee Pesa Feb 27, 2019 10:37 PM) 30ds last filled on 02/02/2019  cholecalciferol (VITAMIN D3) 25 MCG (1000 UT) tablet  [id number] Yes Take 1,000 Units by mouth daily with breakfast. [provider] Taking Active Self  CINNAMON PO [id number] Yes Take 1 capsule by mouth daily with supper. [provider] Taking Active Self  furosemide (LASIX) 40 MG tablet [id number] Yes Take 1 tablet (40 mg total) by mouth daily. Doree Albee, MD Taking Active   isosorbide mononitrate (IMDUR) 30 MG 24 hr tablet [id number] Yes TAKE 1 TABLET IN THE EVENING.  Patient taking differently: Take 30 mg by mouth at bedtime.    Satira Sark, MD Taking Active Self           Med Note Trinidad Curet, Coralee Pesa Feb 27, 2019 10:37 PM) 90ds last filled on 12/06/2018  Multiple Vitamin (MULTIVITAMIN WITH MINERALS) TABS tablet [id number] Yes Take 1 tablet by mouth every morning. [provider] Taking Active Self  multivitamin-lutein (OCUVITE-LUTEIN) CAPS capsule [id number] Yes Take 1 capsule by mouth every morning.  [provider] Taking Active Self  nitroGLYCERIN (NITROSTAT) 0.4 MG SL tablet [id number] Yes Place 1 tablet (0.4 mg total) under the tongue every 5 (five) minutes x 3 doses as needed for chest pain. Cheryln Manly, NP Taking Active Self  rosuvastatin (CRESTOR) 5 MG tablet [id number] Yes Take 1 tablet (5 mg total) by mouth daily. Burnell Blanks, MD Taking Active Self           Med Note Trinidad Curet, Coralee Pesa Feb 27, 2019 10:38 PM) 90ds last filled on 02/21/2019  sodium chloride (OCEAN) 0.65 % SOLN nasal spray [id number] Yes Place 1 spray into both nostrils 2 (two) times daily as needed for congestion.  [provider] Taking Active Self  ticagrelor (BRILINTA) 90 MG TABS tablet [id number] Yes Take 1 tablet (90 mg total) by mouth 2 (two) times daily. Cheryln Manly, NP Taking Active Self           Med Note Trinidad Curet, Coralee Pesa Feb 27, 2019 10:39 PM) 30ds last filled on 02/02/2019          Assessment: Patient declines medication review.   Extra Help:  Not eligible for Extra Help Low  Income Subsidy based on reported income and assets  Patient Assistance Programs: Brilinta made by [place] requirement met: No o Out-of-pocket prescription expenditure met:   Not Applicable - Patient has not met application requirements to apply for this program at this time.  - Reviewed program requirements with patient.   - Patient will meet with cardiologist next month.  She will discuss cost issue with him and possibly ask  for samples or switch to cheaper option (ie: plavix).    Plan: . Will close [facility] pharmacy case as no further medication needs identified at this time.  Am happy to assist in the future as needed.     Ralene Bathe, PharmD, [place] [phone]

## 2019-03-09 ENCOUNTER — Telehealth: Payer: Self-pay | Admitting: Pharmacist

## 2019-03-09 NOTE — Telephone Encounter (Signed)
Called patient to follow up on how she is doing with rosuvastatin 5mg  daily. Patient states she is doing well, but she is not ready to increase to 10mg  yet. Will call patient in another month to see if she is ready to increase/ and or set up labs.

## 2019-03-10 ENCOUNTER — Encounter: Payer: Self-pay | Admitting: Student

## 2019-03-10 ENCOUNTER — Ambulatory Visit (INDEPENDENT_AMBULATORY_CARE_PROVIDER_SITE_OTHER): Payer: Medicare Other | Admitting: Student

## 2019-03-10 ENCOUNTER — Other Ambulatory Visit: Payer: Self-pay | Admitting: Cardiology

## 2019-03-10 ENCOUNTER — Other Ambulatory Visit (HOSPITAL_COMMUNITY)
Admission: RE | Admit: 2019-03-10 | Discharge: 2019-03-10 | Disposition: A | Discharge status: Home / Self Care | Payer: Medicare Other | Source: Ambulatory Visit | Attending: Student | Admitting: Student

## 2019-03-10 ENCOUNTER — Other Ambulatory Visit: Payer: Self-pay

## 2019-03-10 VITALS — BP 149/61 | HR 79 | Temp 98.2°F | Ht 65.0 in | Wt 135.6 lb

## 2019-03-10 DIAGNOSIS — I10 Essential (primary) hypertension: Secondary | ICD-10-CM | POA: Diagnosis not present

## 2019-03-10 DIAGNOSIS — Z79899 Other long term (current) drug therapy: Secondary | ICD-10-CM | POA: Diagnosis not present

## 2019-03-10 DIAGNOSIS — N183 Chronic kidney disease, stage 3 unspecified: Secondary | ICD-10-CM | POA: Diagnosis not present

## 2019-03-10 DIAGNOSIS — I2510 Atherosclerotic heart disease of native coronary artery without angina pectoris: Secondary | ICD-10-CM | POA: Diagnosis not present

## 2019-03-10 DIAGNOSIS — I5022 Chronic systolic (congestive) heart failure: Secondary | ICD-10-CM | POA: Diagnosis not present

## 2019-03-10 DIAGNOSIS — I255 Ischemic cardiomyopathy: Secondary | ICD-10-CM

## 2019-03-10 DIAGNOSIS — E785 Hyperlipidemia, unspecified: Secondary | ICD-10-CM | POA: Diagnosis not present

## 2019-03-10 DIAGNOSIS — I200 Unstable angina: Secondary | ICD-10-CM

## 2019-03-10 LAB — BASIC METABOLIC PANEL
Anion gap: 11 (ref 5–15)
BUN: 34 mg/dL — ABNORMAL HIGH (ref 8–23)
CO2: 29 mmol/L (ref 22–32)
Calcium: 9.2 mg/dL (ref 8.9–10.3)
Chloride: 91 mmol/L — ABNORMAL LOW (ref 98–111)
Creatinine, Ser: 1.64 mg/dL — ABNORMAL HIGH (ref 0.44–1.00)
GFR calc Af Amer: 33 mL/min — ABNORMAL LOW (ref >60)
GFR calc non Af Amer: 29 mL/min — ABNORMAL LOW (ref >60)
Glucose, Bld: 98 mg/dL (ref 70–99)
Potassium: 4.5 mmol/L (ref 3.5–5.1)
Sodium: 131 mmol/L — ABNORMAL LOW (ref 135–145)

## 2019-03-10 MED ORDER — FUROSEMIDE 20 MG PO TABS: 20.0000 mg | ORAL_TABLET | Freq: Every day | ORAL | 3 refills | Status: DC

## 2019-03-10 NOTE — Progress Notes (Signed)
Cardiology Office Note    Date:  03/10/2019   ID:  Crystal [name], DOB 1934-09-12, MRN [id number]  PCP:  Glenda Chroman, MD  Cardiologist: Rozann Lesches, MD   Chief Complaint  Patient presents with  . Hospitalization Follow-up    History of Present Illness:    Crystal [name] is a 85 y.o. female with past medical history of CAD (s/p recent STEMI in 01/2019 with DES to mid-LAD), chronic systolic CHF/ICM (EF 23-55% by echo in 01/2019), HTN, HLD (statin intolerant), Type 2 DM, renal artery stenosis, and history of breast cancer (s/p R mastectomy) who presents to the office today for hospital follow-up.   She was recently presented to [facility] ED on 02/27/2019 for evaluation of worsening dyspnea and was found to have an acute CHF exacerbation with BNP elevated at 2582. She initially required BiPAP but was later transitioned to nasal cannula. She diuresed over 5 L with IV Lasix and was transitioned to Lasix 40 mg daily at the time of discharge (weight 142 lbs). Was continued on ASA 81 mg daily, Candesartan 16mg  daily, Coreg 25mg  BID, Imdur 30mg  daily, Crestor 5mg  daily and Brilinta 90mg  BID.   In talking with the patient today, she reports overall doing well since her recent hospitalization.  She has been following daily weights and this has been stable at 131 - 133 lbs on her home scales since discharge. She denies any dyspnea on exertion, orthopnea, PND or lower extremity edema. No recent chest pain or palpitations.  She has been keeping a detailed blood pressure log and SBP is typically in the 110's to 120's when checked at home. She questions if this is too low for her. She does report some intermittent dizziness but not necessarily associated with positional changes and usually resolves within several seconds. She has been trying to follow a low-sodium diet.  Past Medical History:  Diagnosis Date  . Bell's palsy   . Breast cancer ([place]) 1998   Right mastectomy  . CAD (coronary  artery disease)    a. s/p STEMI in 01/2019 with DES to mid-LAD  . CHF (congestive heart failure) ([place])    a. EF 30-35% by echo in 01/2019  . Essential hypertension   . GERD (gastroesophageal reflux disease)   . Gout   . History of skin cancer    Squamous cell, left shoulder  . History of stroke   . Mixed hyperlipidemia   . Osteopenia   . Reflux esophagitis   . Thyroid nodule   . Type 2 diabetes mellitus ([place])     Past Surgical History:  Procedure Laterality Date  . ABDOMINAL HYSTERECTOMY    . CATARACT EXTRACTION  2016  . CORONARY/GRAFT ACUTE MI REVASCULARIZATION N/A 01/30/2019   Procedure: Coronary/Graft Acute MI Revascularization;  Surgeon: Burnell Blanks, MD;  Location: [place] [facility];  Service: Cardiovascular;  Laterality: N/A;  . LEFT HEART CATH AND CORONARY ANGIOGRAPHY N/A 01/30/2019   Procedure: LEFT HEART CATH AND CORONARY ANGIOGRAPHY;  Surgeon: Burnell Blanks, MD;  Location: [place] [facility];  Service: Cardiovascular;  Laterality: N/A;  . Right mastectomy  1998   [place]    Current Medications: Outpatient Medications Prior to Visit  Medication Sig Dispense Refill  . acetaminophen (TYLENOL) 500 MG tablet Take 500 mg by mouth every 6 (six) hours as needed for headache (pain).    . Alogliptin Benzoate 25 MG TABS Take 12.5 mg by mouth daily at 2 PM.    . aspirin EC  81 MG tablet Take 81 mg by mouth daily.    . Calcium Carbonate-Vitamin D (CALCIUM 500 + D PO) Take 1 tablet by mouth daily.     . candesartan (ATACAND) 16 MG tablet Take 1 tablet (16 mg total) by mouth daily. 30 tablet 1  . carvedilol (COREG) 25 MG tablet Take 1 tablet (25 mg total) by mouth 2 (two) times daily with a meal. 60 tablet 2  . cholecalciferol (VITAMIN D3) 25 MCG (1000 UT) tablet Take 1,000 Units by mouth daily with breakfast.    . CINNAMON PO Take 1 capsule by mouth daily with supper.    . isosorbide mononitrate (IMDUR) 30 MG 24 hr tablet TAKE 1 TABLET IN THE EVENING. 90 tablet 0   . Multiple Vitamin (MULTIVITAMIN WITH MINERALS) TABS tablet Take 1 tablet by mouth every morning.    . multivitamin-lutein (OCUVITE-LUTEIN) CAPS capsule Take 1 capsule by mouth every morning.     . nitroGLYCERIN (NITROSTAT) 0.4 MG SL tablet Place 1 tablet (0.4 mg total) under the tongue every 5 (five) minutes x 3 doses as needed for chest pain. 25 tablet 2  . rosuvastatin (CRESTOR) 5 MG tablet Take 1 tablet (5 mg total) by mouth daily. 90 tablet 3  . sodium chloride (OCEAN) 0.65 % SOLN nasal spray Place 1 spray into both nostrils 2 (two) times daily as needed for congestion.     . ticagrelor (BRILINTA) 90 MG TABS tablet Take 1 tablet (90 mg total) by mouth 2 (two) times daily. 180 tablet 2  . candesartan (ATACAND) 16 MG tablet Take 1 tablet (16 mg total) by mouth daily. 30 tablet 1  . furosemide (LASIX) 40 MG tablet Take 1 tablet (40 mg total) by mouth daily. 30 tablet 0   No facility-administered medications prior to visit.     Allergies:   Bactrim [sulfamethoxazole-trimethoprim], Sulfa antibiotics, Amlodipine, Clonidine derivatives, Evista [raloxifene hcl], Fosamax [alendronate sodium], Glipizide, Lipitor [atorvastatin calcium], Losartan, Pravastatin, and Azithromycin   Social History   Socioeconomic History  . Marital status: Divorced    Spouse name: Not on file  . Number of children: Not on file  . Years of education: Not on file  . Highest education level: Not on file  Occupational History  . Not on file  Tobacco Use  . Smoking status: Never Smoker  . Smokeless tobacco: Never Used  Substance and Sexual Activity  . Alcohol use: Never  . Drug use: Never  . Sexual activity: Not on file  Other Topics Concern  . Not on file  Social History Narrative  . Not on file   Social Determinants of Health   Financial Resource Strain:   . Difficulty of Paying Living Expenses: Not on file  Food Insecurity:   . Worried About Charity fundraiser in the Last Year: Not on file  . Ran Out  of Food in the Last Year: Not on file  Transportation Needs:   . Lack of Transportation (Medical): Not on file  . Lack of Transportation (Non-Medical): Not on file  Physical Activity:   . Days of Exercise per Week: Not on file  . Minutes of Exercise per Session: Not on file  Stress:   . Feeling of Stress : Not on file  Social Connections:   . Frequency of Communication with Friends and Family: Not on file  . Frequency of Social Gatherings with Friends and Family: Not on file  . Attends Religious Services: Not on file  . Active Member of  Clubs or Organizations: Not on file  . Attends Archivist Meetings: Not on file  . Marital Status: Not on file     Family History:  The patient's family history includes Aneurysm in her sister; Cancer in her sister; Heart attack in her father, maternal uncle, maternal uncle, maternal uncle, paternal aunt, paternal aunt, paternal grandfather, and sister; Heart disease in her maternal uncle, maternal uncle, maternal uncle, paternal aunt, paternal aunt, paternal grandfather, and sister; Stroke in her mother.   Review of Systems:   Please see the history of present illness.     General:  No chills, fever, night sweats or weight changes.  Cardiovascular:  No chest pain, dyspnea on exertion, edema, orthopnea, palpitations, paroxysmal nocturnal dyspnea. Dermatological: No rash, lesions/masses Respiratory: No cough, dyspnea Urologic: No hematuria, dysuria Abdominal:   No nausea, vomiting, diarrhea, bright red blood per rectum, melena, or hematemesis Neurologic:  No visual changes, wkns, changes in mental status. Positive for dizziness.    All other systems reviewed and are otherwise negative except as noted above.   Physical Exam:    VS:  BP (!) 149/61   Pulse 79   Temp 98.2 F (36.8 C)   Ht 5\' 5"  (1.651 m)   Wt 135 lb 9.6 oz (61.5 kg)   SpO2 99%   BMI 22.57 kg/m    General: Well developed, well nourished,female appearing in no acute  distress. Head: Normocephalic, atraumatic, sclera non-icteric, no xanthomas, nares are without discharge.  Neck: No carotid bruits. JVD not elevated.  Lungs: Respirations regular and unlabored, without wheezes or rales.  Heart: Regular rate and rhythm. No S3 or S4.  No murmur, no rubs, or gallops appreciated. Abdomen: Soft, non-tender, non-distended with normoactive bowel sounds. No hepatomegaly. No rebound/guarding. No obvious abdominal masses. Msk:  Strength and tone appear normal for age. No joint deformities or effusions. Extremities: No clubbing or cyanosis. No lower extremity edema.  Distal pedal pulses are 2+ bilaterally. Neuro: Alert and oriented X 3. Moves all extremities spontaneously. No focal deficits noted. Psych:  Responds to questions appropriately with a normal affect. Skin: No rashes or lesions noted  Wt Readings from Last 3 Encounters:  03/10/19 135 lb 9.6 oz (61.5 kg)  03/02/19 142 lb 13.7 oz (64.8 kg)  02/16/19 151 lb (68.5 kg)     Studies/Labs Reviewed:   EKG:  EKG is not ordered today.   Recent Labs: 02/27/2019: B Natriuretic Peptide 2,582.0 03/01/2019: ALT 35; Hemoglobin 10.6; Platelets 294 03/02/2019: Magnesium 1.8 03/10/2019: BUN 34; Creatinine, [name] 1.64; Potassium 4.5; Sodium 131   Lipid Panel    Component Value Date/Time   CHOL 331 (H) 01/30/2019 1727   TRIG 169 (H) 01/30/2019 1727   HDL 68 01/30/2019 1727   CHOLHDL 4.9 01/30/2019 1727   VLDL 34 01/30/2019 1727   LDLCALC 229 (H) 01/30/2019 1727    Additional studies/ records that were reviewed today include:   Cardiac Catheterization: 01/2019  Prox RCA lesion is 60% stenosed.  Mid RCA lesion is 40% stenosed.  Prox Cx to Mid Cx lesion is 20% stenosed.  Mid LAD lesion is 99% stenosed.  A drug-eluting stent was successfully placed using a SYNERGY XD 3.0X28.  Post intervention, there is a 0% residual stenosis.   1. Severe stenosis mid LAD 2. Successful PTCA/DES x 1 mid LAD 3. Mild  non-obstructive disease in the Circumflex 4. Moderate non-obstructive disease in the mid RCA  Recommendations: Will admit to the ICU. DAPT with ASA and Brilinta for  one year. Continue home beta blocker. Echo in am. She is statin intolerant. Fast track discharge, possibly home tomorrow if stable.    Echocardiogram: 01/2019 IMPRESSIONS    1. Left ventricular ejection fraction, by visual estimation, is 30 to  35%. The left ventricle has moderately decreased function. There is no  left ventricular hypertrophy.  2. Severe akinesis of the left ventricular, apical apical segment.  3. Left ventricular diastolic parameters are consistent with Grade II  diastolic dysfunction (pseudonormalization).  4. The left ventricle demonstrates regional wall motion abnormalities.  5. Global right ventricle has normal systolic function.The right  ventricular size is normal. No increase in right ventricular wall  thickness.  6. Left atrial size was normal.  7. Right atrial size was normal.  8. The mitral valve is grossly normal. Mild mitral valve regurgitation.  9. The tricuspid valve is normal in structure.  10. The aortic valve is tricuspid. Aortic valve regurgitation is trivial.  No evidence of aortic valve sclerosis or stenosis.  11. The pulmonic valve was normal in structure. Pulmonic valve  regurgitation is not visualized.  12. The atrial septum is grossly normal.   Assessment:    1. Coronary artery disease involving native coronary artery of native heart without angina pectoris   2. Chronic systolic heart failure ([place])   3. Ischemic cardiomyopathy   4. Medication management   5. Essential hypertension   6. Hyperlipidemia LDL goal <70   7. Stage 3 chronic kidney disease, unspecified whether stage 3a or 3b CKD      Plan:   In order of problems listed above:  1. CAD - she is s/p recent STEMI in 01/2019 with DES to mid-LAD. She denies any recurrent chest pain and dyspnea has  improved since recent hospital discharge. - Continue current medication regimen with ASA 81 mg daily, Brilinta 90 mg twice daily, Coreg 25 mg twice daily and Crestor 5 mg daily. She reports Brilinta will be unaffordable long-term and she does not qualify for patient assistance. Reviewed with the patient we can switch this to Plavix in the future if needed. Will ask nursing staff to reach out to the [facility] representative today to see if samples are available.  2. Chronic Systolic CHF/Ischemic Cardiomyopathy - EF 30-35% by echo in 01/2019. She was recently hospitalized for an acute CHF exacerbation and started on Lasix 40 mg daily. Her weight is down to 135 lbs on the office scales which is below her previous baseline weight and she reports intermittent dizziness. I am concerned that she might be dehydrated given her frequent urination. Will plan to reduce Lasix to 20 mg daily with instructions to take an extra 20 mg as needed for weight gain or worsening edema. Recheck BMET today.  - will continue Coreg and Candesartan at current dosing for now. Pending repeat labs, would consider the addition of Spironolactone if renal function remains stable. Entresto likely cost-prohibitive as she was turned down for patient assistance as outlined above. Ultimately will plan for a repeat echo in 3 months following optimization of medical therapy to reassess EF.   3. HTN - BP elevated at 149/61 during today's visit but she brings with her today a BP log and SBP has been in the 110's to 120's when checked at home. Given reports of her dizziness, will plan to reduce Lasix as outlined above.   - Continue Coreg 25 mg twice daily and Candesartan 16 mg daily.    4. HLD - she has been intolerant to  statin therapy in the past and is being followed by the [facility].  She has been started on Crestor 5 mg daily and is overall tolerating this well. The [facility] plans to follow-up with the patient next month and to further  increase dosing to 10 mg daily if overall doing well.  5. Stage 3 CKD - baseline creatinine 1.2 - 1.3. Peaked at 1.80 during recent admission and improved to 1.23 at discharge. Will recheck BMET today.    Medication Adjustments/Labs and Tests Ordered: Current medicines are reviewed at length with the patient today.  Concerns regarding medicines are outlined above.  Medication changes, Labs and Tests ordered today are listed in the Patient Instructions below. Patient Instructions   Medication Instructions:  Your physician has recommended you make the following change in your medication:  Decrease Lasix to 20 mg Daily   *If you need a refill on your cardiac medications before your next appointment, please call your pharmacy*  Lab Work: Your physician recommends that you return for lab work in: Today   If you have labs (blood work) drawn today and your tests are completely normal, you will receive your results only by: Marland Kitchen MyChart Message (if you have MyChart) OR . A paper copy in the mail If you have any lab test that is abnormal or we need to change your treatment, we will call you to review the results.  Testing/Procedures: NONE   Follow-Up: At [facility], you and your health needs are our priority.  As part of our continuing mission to provide you with exceptional heart care, we have created designated Provider Care Teams.  These Care Teams include your primary Cardiologist (physician) and Advanced Practice Providers (APPs -  Physician Assistants and Nurse Practitioners) who all work together to provide you with the care you need, when you need it.  Your next appointment:   1 month(s)  The format for your next appointment:   In Person  Provider:   Rozann Lesches, MD  Other Instructions Thank you for choosing [facility]!    Limit daily fluid intake to less than 2 Liters per day. Please limit salt intake.  Please weight yourself every morning. Take an extra  20mg  Lasix if weight increases by 3 pounds overnight or 5 pounds in a single week.   Two Gram Sodium Diet 2000 mg  What is Sodium? Sodium is a mineral found naturally in many foods. The most significant source of sodium in the diet is table salt, which is about 40% sodium.  Processed, convenience, and preserved foods also contain a large amount of sodium.  The body needs only 500 mg of sodium daily to function,  A normal diet provides more than enough sodium even if you do not use salt.  Why Limit Sodium? A build up of sodium in the body can cause thirst, increased blood pressure, shortness of breath, and water retention.  Decreasing sodium in the diet can reduce edema and risk of heart attack or stroke associated with high blood pressure.  Keep in mind that there are many other factors involved in these health problems.  Heredity, obesity, lack of exercise, cigarette smoking, stress and what you eat all play a role.  General Guidelines:  Do not add salt at the table or in cooking.  One teaspoon of salt contains over 2 grams of sodium.  Read food labels  Avoid processed and convenience foods  Ask your dietitian before eating any foods not dicussed in the  menu planning guidelines  Consult your physician if you wish to use a salt substitute or a sodium containing medication such as antacids.  Limit milk and milk products to 16 oz (2 cups) per day.  Shopping Hints:  READ LABELS!! "Dietetic" does not necessarily mean low sodium.  Salt and other sodium ingredients are often added to foods during processing.   Menu Planning Guidelines Food Group Choose More Often Avoid  Beverages (see also the milk group All fruit juices, low-sodium, salt-free vegetables juices, low-sodium carbonated beverages Regular vegetable or tomato juices, commercially softened water used for drinking or cooking  Breads and Cereals Enriched white, wheat, rye and pumpernickel bread, hard rolls and dinner rolls; muffins,  cornbread and waffles; most dry cereals, cooked cereal without added salt; unsalted crackers and breadsticks; low sodium or homemade bread crumbs Bread, rolls and crackers with salted tops; quick breads; instant hot cereals; pancakes; commercial bread stuffing; self-rising flower and biscuit mixes; regular bread crumbs or cracker crumbs  Desserts and Sweets Desserts and sweets mad with mild should be within allowance Instant pudding mixes and cake mixes  Fats Butter or margarine; vegetable oils; unsalted salad dressings, regular salad dressings limited to 1 Tbs; light, sour and heavy cream Regular salad dressings containing bacon fat, bacon bits, and salt pork; snack dips made with instant soup mixes or processed cheese; salted nuts  Fruits Most fresh, frozen and canned fruits Fruits processed with salt or sodium-containing ingredient (some dried fruits are processed with sodium sulfites        Vegetables Fresh, frozen vegetables and low- sodium canned vegetables Regular canned vegetables, sauerkraut, pickled vegetables, and others prepared in brine; frozen vegetables in sauces; vegetables seasoned with ham, bacon or salt pork  Condiments, Sauces, Miscellaneous  Salt substitute with physician's approval; pepper, herbs, spices; vinegar, lemon or lime juice; hot pepper sauce; garlic powder, onion powder, low sodium soy sauce (1 Tbs.); low sodium condiments (ketchup, chili sauce, mustard) in limited amounts (1 tsp.) fresh ground horseradish; unsalted tortilla chips, pretzels, potato chips, popcorn, salsa (1/4 cup) Any seasoning made with salt including garlic salt, celery salt, onion salt, and seasoned salt; sea salt, rock salt, kosher salt; meat tenderizers; monosodium glutamate; mustard, regular soy sauce, barbecue, sauce, chili sauce, teriyaki sauce, steak sauce, Worcestershire sauce, and most flavored vinegars; canned gravy and mixes; regular condiments; salted snack foods, olives, picles, relish,  horseradish sauce, catsup   Food preparation: Try these seasonings Meats:    Pork Sage, onion Serve with applesauce  Chicken Poultry seasoning, thyme, parsley Serve with cranberry sauce  Lamb Curry powder, rosemary, garlic, thyme Serve with mint sauce or jelly  Veal Marjoram, basil Serve with current jelly, cranberry sauce  Beef Pepper, bay leaf Serve with dry mustard, unsalted chive butter  Fish [place] leaf, dill Serve with unsalted lemon butter, unsalted parsley butter  Vegetables:    Asparagus Lemon juice   Broccoli Lemon juice   Carrots Mustard dressing parsley, mint, nutmeg, glazed with unsalted butter and sugar   Green beans Marjoram, lemon juice, nutmeg,dill seed   Tomatoes Basil, marjoram, onion   Spice /blend for [facility]" 4 tsp ground thyme 1 tsp ground sage 3 tsp ground rosemary 4 tsp ground marjoram     Signed, Erma Heritage, PA-C  03/10/2019 5:10 PM    [facility] [address] [place], [place] [address] Phone: [phone] Fax: [phone]

## 2019-03-10 NOTE — Patient Instructions (Addendum)
Medication Instructions:  Your physician has recommended you make the following change in your medication:  Decrease Lasix to 20 mg Daily   *If you need a refill on your cardiac medications before your next appointment, please call your pharmacy*  Lab Work: Your physician recommends that you return for lab work in: Today   If you have labs (blood work) drawn today and your tests are completely normal, you will receive your results only by: Marland Kitchen MyChart Message (if you have MyChart) OR . A paper copy in the mail If you have any lab test that is abnormal or we need to change your treatment, we will call you to review the results.  Testing/Procedures: NONE   Follow-Up: At [facility], you and your health needs are our priority.  As part of our continuing mission to provide you with exceptional heart care, we have created designated Provider Care Teams.  These Care Teams include your primary Cardiologist (physician) and Advanced Practice Providers (APPs -  Physician Assistants and Nurse Practitioners) who all work together to provide you with the care you need, when you need it.  Your next appointment:   1 month(s)  The format for your next appointment:   In Person  Provider:   Rozann Lesches, MD  Other Instructions Thank you for choosing [facility]!    Limit daily fluid intake to less than 2 Liters per day. Please limit salt intake.  Please weight yourself every morning. Take an extra 20mg  Lasix if weight increases by 3 pounds overnight or 5 pounds in a single week.   Two Gram Sodium Diet 2000 mg  What is Sodium? Sodium is a mineral found naturally in many foods. The most significant source of sodium in the diet is table salt, which is about 40% sodium.  Processed, convenience, and preserved foods also contain a large amount of sodium.  The body needs only 500 mg of sodium daily to function,  A normal diet provides more than enough sodium even if you do not use  salt.  Why Limit Sodium? A build up of sodium in the body can cause thirst, increased blood pressure, shortness of breath, and water retention.  Decreasing sodium in the diet can reduce edema and risk of heart attack or stroke associated with high blood pressure.  Keep in mind that there are many other factors involved in these health problems.  Heredity, obesity, lack of exercise, cigarette smoking, stress and what you eat all play a role.  General Guidelines:  Do not add salt at the table or in cooking.  One teaspoon of salt contains over 2 grams of sodium.  Read food labels  Avoid processed and convenience foods  Ask your dietitian before eating any foods not dicussed in the menu planning guidelines  Consult your physician if you wish to use a salt substitute or a sodium containing medication such as antacids.  Limit milk and milk products to 16 oz (2 cups) per day.  Shopping Hints:  READ LABELS!! "Dietetic" does not necessarily mean low sodium.  Salt and other sodium ingredients are often added to foods during processing.   Menu Planning Guidelines Food Group Choose More Often Avoid  Beverages (see also the milk group All fruit juices, low-sodium, salt-free vegetables juices, low-sodium carbonated beverages Regular vegetable or tomato juices, commercially softened water used for drinking or cooking  Breads and Cereals Enriched white, wheat, rye and pumpernickel bread, hard rolls and dinner rolls; muffins, cornbread and waffles; most dry  cereals, cooked cereal without added salt; unsalted crackers and breadsticks; low sodium or homemade bread crumbs Bread, rolls and crackers with salted tops; quick breads; instant hot cereals; pancakes; commercial bread stuffing; self-rising flower and biscuit mixes; regular bread crumbs or cracker crumbs  Desserts and Sweets Desserts and sweets mad with mild should be within allowance Instant pudding mixes and cake mixes  Fats Butter or margarine;  vegetable oils; unsalted salad dressings, regular salad dressings limited to 1 Tbs; light, sour and heavy cream Regular salad dressings containing bacon fat, bacon bits, and salt pork; snack dips made with instant soup mixes or processed cheese; salted nuts  Fruits Most fresh, frozen and canned fruits Fruits processed with salt or sodium-containing ingredient (some dried fruits are processed with sodium sulfites        Vegetables Fresh, frozen vegetables and low- sodium canned vegetables Regular canned vegetables, sauerkraut, pickled vegetables, and others prepared in brine; frozen vegetables in sauces; vegetables seasoned with ham, bacon or salt pork  Condiments, Sauces, Miscellaneous  Salt substitute with physician's approval; pepper, herbs, spices; vinegar, lemon or lime juice; hot pepper sauce; garlic powder, onion powder, low sodium soy sauce (1 Tbs.); low sodium condiments (ketchup, chili sauce, mustard) in limited amounts (1 tsp.) fresh ground horseradish; unsalted tortilla chips, pretzels, potato chips, popcorn, salsa (1/4 cup) Any seasoning made with salt including garlic salt, celery salt, onion salt, and seasoned salt; sea salt, rock salt, kosher salt; meat tenderizers; monosodium glutamate; mustard, regular soy sauce, barbecue, sauce, chili sauce, teriyaki sauce, steak sauce, Worcestershire sauce, and most flavored vinegars; canned gravy and mixes; regular condiments; salted snack foods, olives, picles, relish, horseradish sauce, catsup   Food preparation: Try these seasonings Meats:    Pork Sage, onion Serve with applesauce  Chicken Poultry seasoning, thyme, parsley Serve with cranberry sauce  Lamb Curry powder, rosemary, garlic, thyme Serve with mint sauce or jelly  Veal Marjoram, basil Serve with current jelly, cranberry sauce  Beef Pepper, bay leaf Serve with dry mustard, unsalted chive butter  Fish [place] leaf, dill Serve with unsalted lemon butter, unsalted parsley butter   Vegetables:    Asparagus Lemon juice   Broccoli Lemon juice   Carrots Mustard dressing parsley, mint, nutmeg, glazed with unsalted butter and sugar   Green beans Marjoram, lemon juice, nutmeg,dill seed   Tomatoes Basil, marjoram, onion   Spice /blend for [employer] [facility]" 4 tsp ground thyme 1 tsp ground sage 3 tsp ground rosemary 4 tsp ground marjoram

## 2019-03-11 ENCOUNTER — Other Ambulatory Visit: Payer: Self-pay | Admitting: *Deleted

## 2019-03-11 NOTE — Patient Outreach (Signed)
Outreach call to pt for transition of care week 2, pt reports she is doing well today, saw cardiologist this week, continues weighing daily, weight today 132 pounds, CBG yesterday after eating 111 and pt continues checking CBG once daily.  Pt states outpatient rehab (PT) has her referral but previously did not see her due to covid, pt states she will call back and reschedule.  No new concerns voiced.  THN CM Care Plan Problem One     Most Recent Value  Care Plan Problem One  Knowledge deficit related to CHF  Role Documenting the Problem One  Care Management Coordinator  Care Plan for Problem One  Active  THN Long Term Goal   Pt will verbalize, demonstrate improved self care for CHF within 60 days  THN Long Term Goal Start Date  03/04/19  Interventions for Problem One Long Term Goal  RN CM reviewed plan of care with pt, informed her case will be transferred to another [facility] RN CM due to role change.  Pt did see cardiologist on 03/07/19, pt has all medications and taking as prescribed.  [facility]N CM Short Term Goal #1   Pt will verbalize CHF action plan/ zones within 30 days  [facility]N CM Short Term Goal #1 Start Date  03/04/19  Interventions for Short Term Goal #1  RN CM reinforced HF action plan with emphasis on yellow zone.  THN CM Short Term Goal #2   Pt will weigh daily and record within 30 days  THN CM Short Term Goal #2 Start Date  03/04/19  Interventions for Short Term Goal #2  Pt is weighing daily, RN CM will continue to provide reminders, oversight   Pt did receive [facility] calendar and is recording weight in calendar  [facility] CM Short Term Goal #3  Pt will verbalize foods high in sodium to limit/ avoid within 30 days  THN CM Short Term Goal #3 Start Date  03/04/19  Interventions for Short Tern Goal #3  Pt did receive low sodium poster and plans to review      PLAN Continue weekly transition of care calls  Jacqlyn Larsen [facility], [place] Coordinator [phone]

## 2019-03-14 ENCOUNTER — Telehealth: Payer: Self-pay | Admitting: *Deleted

## 2019-03-14 DIAGNOSIS — Z79899 Other long term (current) drug therapy: Secondary | ICD-10-CM

## 2019-03-14 NOTE — Telephone Encounter (Signed)
-----   Message from Erma Heritage, [place] sent at 03/10/2019  4:57 PM EST ----- Please let the patient know her renal function has acutely worsened since hospital discharge and I suspect this is secondary to dehydration. We did reduce Lasix to 20 mg daily at the time of her visit today. Would recommend a repeat BMET in 1 week to make sure this is improving. Please forward a copy of results to Glenda Chroman, MD.

## 2019-03-14 NOTE — Telephone Encounter (Signed)
Pt.notified

## 2019-03-18 ENCOUNTER — Other Ambulatory Visit: Payer: Self-pay | Admitting: *Deleted

## 2019-03-18 NOTE — Patient Outreach (Signed)
Transition of care call #3.  Introduced myself as being her new Transport planner since Almyra Free has been appointed a new position.  Crystal [name] reports she is doing well. She is following her HF Action plan and can tell me what she does daily to manage her HF (self assessment, weigh, take meds, exercise/walking, low salt diet). She knows when she is in the [place] (wt up, more SOB, more edema, generally not feeling well).  Today she weighs 131.8, denies SOB or edema. She has increased her walking to daily trips to the mailbox, weekly trip to take out trash and grocery shopping.   Her diabetes is in good control, FBS 108 today. She reports all her FBS are <120 and NFBS are <160.  She was scheduled to get her first [place] vaccination on Wednesday but when she arrived at the hospital in [place], it was evident she would have to wait at least an hour and she did not want to do that in that environment. She is rescheduled for next Wednesday.  Advised I will call her again in one week.  Crystal Pont. [name], MSN, [facility] Gerontological Nurse Practitioner [facility] Care Management [phone]

## 2019-03-21 ENCOUNTER — Other Ambulatory Visit: Payer: Self-pay

## 2019-03-21 ENCOUNTER — Other Ambulatory Visit (HOSPITAL_COMMUNITY)
Admission: RE | Admit: 2019-03-21 | Discharge: 2019-03-21 | Disposition: A | Discharge status: Home / Self Care | Payer: Medicare Other | Source: Ambulatory Visit | Attending: Student | Admitting: Student

## 2019-03-21 DIAGNOSIS — Z79899 Other long term (current) drug therapy: Secondary | ICD-10-CM | POA: Diagnosis not present

## 2019-03-21 LAB — BASIC METABOLIC PANEL
Anion gap: 11 (ref 5–15)
BUN: 27 mg/dL — ABNORMAL HIGH (ref 8–23)
CO2: 28 mmol/L (ref 22–32)
Calcium: 9.7 mg/dL (ref 8.9–10.3)
Chloride: 93 mmol/L — ABNORMAL LOW (ref 98–111)
Creatinine, Ser: 1.37 mg/dL — ABNORMAL HIGH (ref 0.44–1.00)
GFR calc Af Amer: 42 mL/min — ABNORMAL LOW (ref >60)
GFR calc non Af Amer: 36 mL/min — ABNORMAL LOW (ref >60)
Glucose, Bld: 97 mg/dL (ref 70–99)
Potassium: 4 mmol/L (ref 3.5–5.1)
Sodium: 132 mmol/L — ABNORMAL LOW (ref 135–145)

## 2019-03-23 ENCOUNTER — Telehealth: Payer: Self-pay | Admitting: *Deleted

## 2019-03-23 DIAGNOSIS — Z23 Encounter for immunization: Secondary | ICD-10-CM | POA: Diagnosis not present

## 2019-03-23 NOTE — Telephone Encounter (Signed)
Patient given lab results, copied pcp

## 2019-03-23 NOTE — Telephone Encounter (Signed)
-----   Message from Erma Heritage, [place] sent at 03/21/2019 11:41 AM EST ----- Please let the patient know her kidney function has almost returned to baseline following recent dose reduction of Lasix as creatinine was previously elevated at 1.64 and is now at 1.37. Sodium levels improving and potassium is within a normal range. Continue current medication regimen at this time. Please forward a copy of results to Glenda Chroman, MD.   Alda Berthold, she had requested Brilinta 90mg  samples and we did not have any at the time of her visit. Please recheck to see if we do at this time.

## 2019-03-23 NOTE — Telephone Encounter (Signed)
Called patient with test results. No answer. Left message to call back.  

## 2019-03-23 NOTE — Telephone Encounter (Signed)
Left message returning call for results

## 2019-03-25 ENCOUNTER — Other Ambulatory Visit: Payer: Self-pay

## 2019-03-25 ENCOUNTER — Other Ambulatory Visit: Payer: Self-pay | Admitting: *Deleted

## 2019-03-25 NOTE — Patient Outreach (Signed)
Transition of care call #4  Crystal [name] is doing very well. She has grasped the concepts of self management and the HF Action Plan. No HF sxs. Wt stable at 131. Taking meds as ordered. Getting as much activity as possible. She complains that her diuretic makes it hard for her to get out and do things.  Her blood pressure readings are all under 146 systollically.  Her FBS is always under 120.  She has received her first [place].  Praised pt for taking her health condition seriously and following the instructions for changes in her life style to improve her condition and help her live with a better quality of life.  Discussed that if she has an appt or errand to do in the am, she may postpone her am furosemide until she returns home [place].  Discussed the fine balance between managing her HF and monitoring her blood for signs of kidney stress. Advised sometimes the doctor may have to adjust your medications either to get rid of excess fluids or to give the kidneys a rest.  I will call her again in one week.  [name]. [name], MSN, [facility] Gerontological Nurse Practitioner [facility] Care Management [phone]

## 2019-03-28 DIAGNOSIS — E119 Type 2 diabetes mellitus without complications: Secondary | ICD-10-CM | POA: Diagnosis not present

## 2019-03-28 DIAGNOSIS — I10 Essential (primary) hypertension: Secondary | ICD-10-CM | POA: Diagnosis not present

## 2019-03-31 ENCOUNTER — Other Ambulatory Visit: Payer: Self-pay | Admitting: *Deleted

## 2019-03-31 DIAGNOSIS — Z961 Presence of intraocular lens: Secondary | ICD-10-CM | POA: Diagnosis not present

## 2019-03-31 DIAGNOSIS — H52203 Unspecified astigmatism, bilateral: Secondary | ICD-10-CM | POA: Diagnosis not present

## 2019-03-31 DIAGNOSIS — E119 Type 2 diabetes mellitus without complications: Secondary | ICD-10-CM | POA: Diagnosis not present

## 2019-03-31 NOTE — Patient Outreach (Signed)
Transition of care call:  No answer, left message for a return call.  Crystal [name]. [name], MSN, [facility] Gerontological Nurse Practitioner [facility] Care Management [phone]

## 2019-04-01 ENCOUNTER — Telehealth: Payer: Self-pay

## 2019-04-01 ENCOUNTER — Other Ambulatory Visit: Payer: Self-pay | Admitting: *Deleted

## 2019-04-01 DIAGNOSIS — Z79899 Other long term (current) drug therapy: Secondary | ICD-10-CM

## 2019-04-01 NOTE — Patient Outreach (Signed)
Returned call from Crystal [name].  She reports she is doing well. She is following her HF Action plan of daily self assessment, wt, taking meds, HF zone, all in the GREEN, no edema, no SOB.  She has received her first [place] vaccination. She reports she had a small bruise and was tender at the site.  She does report 2 episodes of diarrhea this am and she thought she saw some blood in the stool itself, something pink. She denied seeing blood in the toilet water or on the toilet tissue. She reported this to her MD who has ordered a CBC to be drawn on Monday.She is on Brilinta for CAD stent anticoagulation.  I will call again in one week.  Crystal Pont. [name], MSN, [facility] Gerontological Nurse Practitioner [facility] Care Management [phone] \

## 2019-04-01 NOTE — Telephone Encounter (Signed)
Pt states she feels brilinta is causing her to have bloody stools.     Please call [phone]  Thanks renee

## 2019-04-01 NOTE — Telephone Encounter (Signed)
Pt c/o dark and pink colored stool on last night. Reports 2 episodes on last nights and none this morning. Pt denies SOB at this time. Please advise.

## 2019-04-01 NOTE — Addendum Note (Signed)
Addended by: Levonne Hubert on: 04/01/2019 12:23 PM   Modules accepted: Orders

## 2019-04-01 NOTE — Telephone Encounter (Signed)
     Did she have anything significantly different in her diet over the past few days? If she has noticed this and symptoms continue, would recommend checking a CBC to make sure her Hgb and platelets remain stable. Hgb was at 10.6 when checked on 2/2. We do not have access to stool cards through our lab here but if symptoms persist through the weekend, would recommend obtaining hemoccult stool cards from her PCP.   Was she able to get Brilinta samples previously? At the time of her visit, she was unable to afford this and we had discussed switching to Plavix.   Signed, Erma Heritage, PA-C 04/01/2019, 10:16 AM Pager: [phone]

## 2019-04-04 ENCOUNTER — Other Ambulatory Visit (HOSPITAL_COMMUNITY)
Admission: RE | Admit: 2019-04-04 | Discharge: 2019-04-04 | Disposition: A | Discharge status: Home / Self Care | Payer: Medicare Other | Source: Ambulatory Visit | Attending: Student | Admitting: Student

## 2019-04-04 DIAGNOSIS — Z79899 Other long term (current) drug therapy: Secondary | ICD-10-CM | POA: Diagnosis not present

## 2019-04-04 LAB — CBC
HCT: 33 % — ABNORMAL LOW (ref 36.0–46.0)
Hemoglobin: 10.4 g/dL — ABNORMAL LOW (ref 12.0–15.0)
MCH: 27 pg (ref 26.0–34.0)
MCHC: 31.5 g/dL (ref 30.0–36.0)
MCV: 85.7 fL (ref 80.0–100.0)
Platelets: 306 10*3/uL (ref 150–400)
RBC: 3.85 MIL/uL — ABNORMAL LOW (ref 3.87–5.11)
RDW: 14.3 % (ref 11.5–15.5)
WBC: 8.2 10*3/uL (ref 4.0–10.5)
nRBC: 0 % (ref 0.0–0.2)

## 2019-04-07 ENCOUNTER — Other Ambulatory Visit: Payer: Self-pay | Admitting: *Deleted

## 2019-04-07 DIAGNOSIS — K921 Melena: Secondary | ICD-10-CM | POA: Diagnosis not present

## 2019-04-07 DIAGNOSIS — I10 Essential (primary) hypertension: Secondary | ICD-10-CM | POA: Diagnosis not present

## 2019-04-07 DIAGNOSIS — Z299 Encounter for prophylactic measures, unspecified: Secondary | ICD-10-CM | POA: Diagnosis not present

## 2019-04-07 NOTE — Patient Outreach (Signed)
Transition of care call.  Crystal [name] did not answer he phone today, however, I was able to leave a voicemail and requested a call back for a report.  Advised I will call her again next Friday 04/15/19.  [name]. [name], MSN, [facility] Gerontological Nurse Practitioner [facility] Care Management [phone]

## 2019-04-07 NOTE — Patient Outreach (Signed)
Mrs. Stege returned my call. She reports she has been to see Dr. Woody Seller because she has had 2 black stools. When tested she was hem-negative. Advised to monitor and inform of any further similar stools or bright red blood. May need referral to GI.  She reports her wt. Is stable at 131, BPs WNL, O2 99% and her glucose was 107 today.  We agreed we would talk after she sees her doctor, Monday, March 22.  [name]. [name], MSN, [facility] Gerontological Nurse Practitioner [facility] Care Management [phone]

## 2019-04-08 NOTE — Telephone Encounter (Signed)
Pt notified of recommendations and the need of lab work. Pt did state she has been eating blueberries.

## 2019-04-11 ENCOUNTER — Telehealth: Payer: Self-pay | Admitting: Pharmacist

## 2019-04-11 DIAGNOSIS — E782 Mixed hyperlipidemia: Secondary | ICD-10-CM

## 2019-04-11 NOTE — Telephone Encounter (Signed)
Spoke with patient who states she is doing well on rosuvastatin 5mg  daily. She would like to check lipid panel before increasing dose. I have ordered labs for patient to get done at [facility]. States she will go on Wed probably.

## 2019-04-13 ENCOUNTER — Other Ambulatory Visit (HOSPITAL_COMMUNITY)
Admission: RE | Admit: 2019-04-13 | Discharge: 2019-04-13 | Disposition: A | Discharge status: Home / Self Care | Payer: Medicare Other | Source: Ambulatory Visit | Attending: Cardiology | Admitting: Cardiology

## 2019-04-13 ENCOUNTER — Telehealth: Payer: Self-pay

## 2019-04-13 ENCOUNTER — Other Ambulatory Visit: Payer: Self-pay

## 2019-04-13 DIAGNOSIS — E782 Mixed hyperlipidemia: Secondary | ICD-10-CM | POA: Insufficient documentation

## 2019-04-13 LAB — LIPID PANEL
Cholesterol: 155 mg/dL (ref 0–200)
HDL: 56 mg/dL (ref >40)
LDL Cholesterol: 77 mg/dL (ref 0–99)
Total CHOL/HDL Ratio: 2.8 RATIO
Triglycerides: 111 mg/dL (ref <150)
VLDL: 22 mg/dL (ref 0–40)

## 2019-04-13 LAB — HEPATIC FUNCTION PANEL
ALT: 17 U/L (ref 0–44)
AST: 21 U/L (ref 15–41)
Albumin: 4.1 g/dL (ref 3.5–5.0)
Alkaline Phosphatase: 60 U/L (ref 38–126)
Bilirubin, Direct: <0.1 mg/dL (ref 0.0–0.2)
Total Bilirubin: 0.6 mg/dL (ref 0.3–1.2)
Total Protein: 7.1 g/dL (ref 6.5–8.1)

## 2019-04-13 NOTE — Telephone Encounter (Signed)
-----   Message from Satira Sark, MD sent at 04/13/2019 12:04 PM EDT ----- Results reviewed.  Lab work ordered by lipid clinic.  Actually, she has had a fairly remarkable improvement in numbers with LDL from 229 down to 77 on Crestor 5 mg daily.  I am forwarding this information to the lipid clinic.  Plan was to increase Crestor to 10 mg daily.  If patient did not tolerate that, she would probably be a good candidate to stay on Crestor 5 mg daily plus Zetia given the response noted.

## 2019-04-13 NOTE — Telephone Encounter (Signed)
Called pt. No answer, left message for pt to return call.  

## 2019-04-15 ENCOUNTER — Encounter: Payer: Self-pay | Admitting: Cardiology

## 2019-04-15 ENCOUNTER — Other Ambulatory Visit: Payer: Self-pay

## 2019-04-15 ENCOUNTER — Ambulatory Visit (INDEPENDENT_AMBULATORY_CARE_PROVIDER_SITE_OTHER): Payer: Medicare Other | Admitting: Cardiology

## 2019-04-15 ENCOUNTER — Ambulatory Visit: Payer: Medicare Other | Admitting: *Deleted

## 2019-04-15 VITALS — BP 174/70 | HR 63 | Ht 65.0 in | Wt 133.0 lb

## 2019-04-15 DIAGNOSIS — I255 Ischemic cardiomyopathy: Secondary | ICD-10-CM | POA: Diagnosis not present

## 2019-04-15 DIAGNOSIS — N1832 Chronic kidney disease, stage 3b: Secondary | ICD-10-CM

## 2019-04-15 DIAGNOSIS — I200 Unstable angina: Secondary | ICD-10-CM | POA: Diagnosis not present

## 2019-04-15 DIAGNOSIS — I5022 Chronic systolic (congestive) heart failure: Secondary | ICD-10-CM

## 2019-04-15 DIAGNOSIS — I25119 Atherosclerotic heart disease of native coronary artery with unspecified angina pectoris: Secondary | ICD-10-CM | POA: Diagnosis not present

## 2019-04-15 MED ORDER — CLOPIDOGREL BISULFATE 75 MG PO TABS: 75.0000 mg | ORAL_TABLET | Freq: Every day | ORAL | 1 refills | Status: DC

## 2019-04-15 MED ORDER — SPIRONOLACTONE 25 MG PO TABS: 12.5000 mg | ORAL_TABLET | Freq: Every day | ORAL | 1 refills | Status: DC

## 2019-04-15 NOTE — Progress Notes (Signed)
Cardiology Office Note  Date: 04/15/2019   ID: [name], DOB December 04, 1934, MRN [id number]  PCP:  Glenda Chroman, MD  Cardiologist:  Rozann Lesches, MD Electrophysiologist:  None   Chief Complaint  Patient presents with  . Cardiac follow-up    History of Present Illness: Crystal [name] is an 85 y.o. female last seen in February by Ms. Strader PA-C.  She presents for a follow-up visit.  I reviewed her chart and recent medication adjustments.  She is tolerating lower dose of Lasix, her weight has been quite stable.  We went over her home blood pressure checks as well with systolics ranging from 476L to 140s.  She states that her home blood pressure cuff has indicated "irregular" at times, she did have some PVCs on examination today.  She does not report any palpitations or syncope.  No active angina at this time.  She states that she has seen some blood in her stools, was evaluated by Dr. Woody Seller.  She tells me that heme check was negative but she still states that she has had some recurring bleeding.  I asked her to make sure Dr. Woody Seller is aware of this and it does sound like they have talked about GI consultation ultimately.  I went over her most recent lab work, hemoglobin was 10.4, creatinine 1.37, potassium 4.0.  We went over her medications in detail today and discussed anticipated changes.  Past Medical History:  Diagnosis Date  . Bell's palsy   . Breast cancer ([place]) 1998   Right mastectomy  . CAD (coronary artery disease)    a. s/p STEMI in 01/2019 with DES to mid-LAD  . CHF (congestive heart failure) ([place])    a. EF 30-35% by echo in 01/2019  . Essential hypertension   . GERD (gastroesophageal reflux disease)   . Gout   . History of skin cancer    Squamous cell, left shoulder  . History of stroke   . Mixed hyperlipidemia   . Osteopenia   . Reflux esophagitis   . Thyroid nodule   . Type 2 diabetes mellitus ([place])     Past Surgical History:  Procedure Laterality  Date  . ABDOMINAL HYSTERECTOMY    . CATARACT EXTRACTION  2016  . CORONARY/GRAFT ACUTE MI REVASCULARIZATION N/A 01/30/2019   Procedure: Coronary/Graft Acute MI Revascularization;  Surgeon: Burnell Blanks, MD;  Location: [facility];  Service: Cardiovascular;  Laterality: N/A;  . LEFT HEART CATH AND CORONARY ANGIOGRAPHY N/A 01/30/2019   Procedure: LEFT HEART CATH AND CORONARY ANGIOGRAPHY;  Surgeon: Burnell Blanks, MD;  Location: [facility];  Service: Cardiovascular;  Laterality: N/A;  . Right mastectomy  1998   [place]    Current Outpatient Medications  Medication Sig Dispense Refill  . acetaminophen (TYLENOL) 500 MG tablet Take 500 mg by mouth every 6 (six) hours as needed for headache (pain).    . Alogliptin Benzoate 25 MG TABS Take 12.5 mg by mouth daily at 2 PM.    . aspirin EC 81 MG tablet Take 81 mg by mouth daily.    . Calcium Carbonate-Vitamin D (CALCIUM 500 + D PO) Take 1 tablet by mouth daily.     . candesartan (ATACAND) 16 MG tablet Take 1 tablet (16 mg total) by mouth daily. 30 tablet 1  . carvedilol (COREG) 25 MG tablet Take 1 tablet (25 mg total) by mouth 2 (two) times daily with a meal. 60 tablet 2  . cholecalciferol (VITAMIN D3)  25 MCG (1000 UT) tablet Take 1,000 Units by mouth daily with breakfast.    . CINNAMON PO Take 1 capsule by mouth daily with supper.    . furosemide (LASIX) 20 MG tablet Take 1 tablet (20 mg total) by mouth daily. 90 tablet 3  . isosorbide mononitrate (IMDUR) 30 MG 24 hr tablet TAKE 1 TABLET IN THE EVENING. 90 tablet 0  . Multiple Vitamin (MULTIVITAMIN WITH MINERALS) TABS tablet Take 1 tablet by mouth every morning.    . multivitamin-lutein (OCUVITE-LUTEIN) CAPS capsule Take 1 capsule by mouth every morning.     . nitroGLYCERIN (NITROSTAT) 0.4 MG SL tablet Place 1 tablet (0.4 mg total) under the tongue every 5 (five) minutes x 3 doses as needed for chest pain. 25 tablet 2  . rosuvastatin (CRESTOR) 5 MG tablet Take 1 tablet  (5 mg total) by mouth daily. 90 tablet 3  . sodium chloride (OCEAN) 0.65 % SOLN nasal spray Place 1 spray into both nostrils 2 (two) times daily as needed for congestion.     . clopidogrel (PLAVIX) 75 MG tablet Take 1 tablet (75 mg total) by mouth daily. 90 tablet 1  . spironolactone (ALDACTONE) 25 MG tablet Take 0.5 tablets (12.5 mg total) by mouth daily. 45 tablet 1   No current facility-administered medications for this visit.   Allergies:  Bactrim [sulfamethoxazole-trimethoprim], Sulfa antibiotics, Amlodipine, Clonidine derivatives, Evista [raloxifene hcl], Fosamax [alendronate sodium], Glipizide, Lipitor [atorvastatin calcium], Losartan, Pravastatin, and Azithromycin   ROS:   No syncope.  Physical Exam: VS:  BP (!) 174/70   Pulse 63   Ht 5\' 5"  (1.651 m)   Wt 133 lb (60.3 kg)   SpO2 98%   BMI 22.13 kg/m , BMI Body mass index is 22.13 kg/m.  Wt Readings from Last 3 Encounters:  04/15/19 133 lb (60.3 kg)  03/10/19 135 lb 9.6 oz (61.5 kg)  03/02/19 142 lb 13.7 oz (64.8 kg)    General: Elderly woman, appears comfortable at rest. HEENT: Conjunctiva and lids normal, wearing a mask. Neck: Supple, no elevated JVP or carotid bruits, no thyromegaly. Lungs: Clear to auscultation, nonlabored breathing at rest. Cardiac: Regular rate and rhythm with ectopy, no S3 or significant systolic murmur. Abdomen: Soft, nontender,bowel sounds present. Extremities: No pitting edema, distal pulses 2+.  ECG:  An ECG dated 02/27/2019 was personally reviewed today and demonstrated:  Sinus tachycardia with nonspecific ST-T changes, possible lateral ischemia.  Recent Labwork: 02/27/2019: B Natriuretic Peptide 2,582.0 03/02/2019: Magnesium 1.8 03/21/2019: BUN 27; Creatinine, Ser 1.37; Potassium 4.0; Sodium 132 04/04/2019: Hemoglobin 10.4; Platelets 306 04/13/2019: ALT 17; AST 21     Component Value Date/Time   CHOL 155 04/13/2019 1118   TRIG 111 04/13/2019 1118   HDL 56 04/13/2019 1118   CHOLHDL 2.8  04/13/2019 1118   VLDL 22 04/13/2019 1118   LDLCALC 77 04/13/2019 1118    Other Studies Reviewed Today:  Echocardiogram 01/31/2019: 1. Left ventricular ejection fraction, by visual estimation, is 30 to  35%. The left ventricle has moderately decreased function. There is no  left ventricular hypertrophy.  2. Severe akinesis of the left ventricular, apical apical segment.  3. Left ventricular diastolic parameters are consistent with Grade II  diastolic dysfunction (pseudonormalization).  4. The left ventricle demonstrates regional wall motion abnormalities.  5. Global right ventricle has normal systolic function.The right  ventricular size is normal. No increase in right ventricular wall  thickness.  6. Left atrial size was normal.  7. Right atrial size was normal.  8. The mitral valve is grossly normal. Mild mitral valve regurgitation.  9. The tricuspid valve is normal in structure.  10. The aortic valve is tricuspid. Aortic valve regurgitation is trivial.  No evidence of aortic valve sclerosis or stenosis.  11. The pulmonic valve was normal in structure. Pulmonic valve  regurgitation is not visualized.  12. The atrial septum is grossly normal.   Cardiac catheterization 01/30/2019:  Prox RCA lesion is 60% stenosed.  Mid RCA lesion is 40% stenosed.  Prox Cx to Mid Cx lesion is 20% stenosed.  Mid LAD lesion is 99% stenosed.  A drug-eluting stent was successfully placed using a SYNERGY XD 3.0X28.  Post intervention, there is a 0% residual stenosis.   1. Severe stenosis mid LAD 2. Successful PTCA/DES x 1 mid LAD 3. Mild non-obstructive disease in the Circumflex 4. Moderate non-obstructive disease in the mid RCA  Recommendations: Will admit to the ICU. DAPT with ASA and Brilinta for one year. Continue home beta blocker. Echo in am. She is statin intolerant. Fast track discharge, possibly home tomorrow if stable.   Assessment and Plan:  1.  CAD status post STEMI in  January of this year with DES intervention to the mid LAD.  She does not report any active angina symptoms at this time.  Continues to have difficulty affording Brilinta, we will switch to Plavix and otherwise continue aspirin.  She is also on beta-blocker in the form of Coreg and Crestor.  2.  Ischemic cardiomyopathy with LVEF 30 to 35% by echocardiogram in January.  Weight has been stable and she does not present with volume overload today.  Continue Coreg, low-dose Lasix, start Aldactone 12.5 mg daily.  Also check into insurance coverage for [facility] as a substitute for candesartan.  Hold off on repeat echocardiogram until medication adjustments have been stabilized.  Follow-up BMET in 10 days.  3.  CKD stage III, last creatinine 1.37 with normal potassium.  Medication Adjustments/Labs and Tests Ordered: Current medicines are reviewed at length with the patient today.  Concerns regarding medicines are outlined above.   Tests Ordered: Orders Placed This Encounter  Procedures  . Basic metabolic panel    Medication Changes: Meds ordered this encounter  Medications  . clopidogrel (PLAVIX) 75 MG tablet    Sig: Take 1 tablet (75 mg total) by mouth daily.    Dispense:  90 tablet    Refill:  1    04/15/2019 stop brilinta  . spironolactone (ALDACTONE) 25 MG tablet    Sig: Take 0.5 tablets (12.5 mg total) by mouth daily.    Dispense:  45 tablet    Refill:  1    04/15/2019 NEW    Disposition:  Follow up 4 to 6 weeks.  Signed, Satira Sark, MD, [facility] 04/15/2019 4:54 PM    [facility] [place], [place], [place] [address] Phone: [phone]; Fax: [phone]

## 2019-04-15 NOTE — Patient Instructions (Addendum)
Medication Instructions:    Your physician has recommended you make the following change in your medication:   Stop brilinta  Start clopidogrel 75 mg by mouth daily  Start spironolactone 12.5 mg by mouth daily  Continue other medications the same  Labwork:  Your physician recommends that you return for non-fasting lab work in: 10 days to check your BMET. This may be done at [facility] or [facility] [phone])  Testing/Procedures:  NONE  Follow-Up:  Your physician recommends that you schedule a follow-up appointment in: 4-6 weeks ([place] or [place] office).  Any Other Special Instructions Will Be Listed Below (If Applicable).  Please call your insurance company to check the price for entresto 24/26 mg one by mouth twice daily. #60/month  If you need a refill on your cardiac medications before your next appointment, please call your pharmacy.

## 2019-04-16 DIAGNOSIS — D259 Leiomyoma of uterus, unspecified: Secondary | ICD-10-CM | POA: Diagnosis not present

## 2019-04-16 DIAGNOSIS — N1832 Chronic kidney disease, stage 3b: Secondary | ICD-10-CM | POA: Diagnosis not present

## 2019-04-16 DIAGNOSIS — D62 Acute posthemorrhagic anemia: Secondary | ICD-10-CM | POA: Diagnosis not present

## 2019-04-16 DIAGNOSIS — I129 Hypertensive chronic kidney disease with stage 1 through stage 4 chronic kidney disease, or unspecified chronic kidney disease: Secondary | ICD-10-CM | POA: Diagnosis not present

## 2019-04-16 DIAGNOSIS — E1122 Type 2 diabetes mellitus with diabetic chronic kidney disease: Secondary | ICD-10-CM | POA: Diagnosis present

## 2019-04-16 DIAGNOSIS — Z7982 Long term (current) use of aspirin: Secondary | ICD-10-CM | POA: Diagnosis not present

## 2019-04-16 DIAGNOSIS — T45525A Adverse effect of antithrombotic drugs, initial encounter: Secondary | ICD-10-CM | POA: Diagnosis not present

## 2019-04-16 DIAGNOSIS — I700 Atherosclerosis of aorta: Secondary | ICD-10-CM | POA: Diagnosis not present

## 2019-04-16 DIAGNOSIS — Z853 Personal history of malignant neoplasm of breast: Secondary | ICD-10-CM | POA: Diagnosis not present

## 2019-04-16 DIAGNOSIS — K921 Melena: Secondary | ICD-10-CM | POA: Diagnosis not present

## 2019-04-16 DIAGNOSIS — I774 Celiac artery compression syndrome: Secondary | ICD-10-CM | POA: Diagnosis not present

## 2019-04-16 DIAGNOSIS — D649 Anemia, unspecified: Secondary | ICD-10-CM | POA: Diagnosis not present

## 2019-04-16 DIAGNOSIS — K8020 Calculus of gallbladder without cholecystitis without obstruction: Secondary | ICD-10-CM | POA: Diagnosis not present

## 2019-04-16 DIAGNOSIS — Z20822 Contact with and (suspected) exposure to covid-19: Secondary | ICD-10-CM | POA: Diagnosis present

## 2019-04-18 ENCOUNTER — Other Ambulatory Visit: Payer: Self-pay | Admitting: *Deleted

## 2019-04-18 NOTE — Patient Outreach (Addendum)
Telephone outreach.  No answer, able to leave a voice mail and requested a return call.  Crystal [name]. [name], MSN, GNP-BC Gerontological Nurse Practitioner [facility] Care Management [phone]  [name] called me back. She reports she had to go to the ED over the weekend for bloody diarrhea and nausea. She was not admitted. Orders were for her to stop her Brillenta, wait 2 days and begin Plavix. She has continued her ASA and she has not had any apparent bleeding since Saturday.  Dr. Domenic Polite has proposed she take Valere Dross, [name] looked into the cost, her co-pay would be $285.00 and she cannot afford that.  THN CM Care Plan Problem One     Most Recent Value  Care Plan Problem One  Knowledge deficit related to CHF  (Pended)   Role Documenting the Problem One  Care Management Coordinator  (Pended)   [place] for Problem One  Active  (Pended)   THN Long Term Goal   Pt will verbalize, demonstrate improved self care for CHF within 60 days  (Pended)   THN Long Term Goal Start Date  03/04/19  (Pended)   THN Long Term Goal Met Date  03/25/19  (Pended)   THN CM Short Term Goal #1   Pt will verbalize CHF action plan/ zones within 30 days  (Pended)   THN CM Short Term Goal #1 Start Date  03/04/19  (Pended)   THN CM Short Term Goal #1 Met Date  03/25/19  (Pended)   THN CM Short Term Goal #2   Pt will weigh daily and record within 30 days  (Pended)   THN CM Short Term Goal #2 Start Date  03/04/19  (Pended)   THN CM Short Term Goal #2 Met Date  03/25/19  (Pended)   THN CM Short Term Goal #3  Pt will verbalize foods high in sodium to limit/ avoid within 30 days  (Pended)   THN CM Short Term Goal #3 Start Date  03/04/19  (Pended)     [facility] CM Care Plan Problem Two     Most Recent Value  Care Plan Problem Two  No Advanced Directives  (Pended)   Role Documenting the Problem Two  Care Management Coordinator  (Pended)   Care Plan for Problem Two  Active  (Pended)      I will call pt  again in one week.  Called Dr. Myles Gip office and left a message regarding pt cannot afford the Entresto.  Crystal [name]. [name], MSN, [facility] Gerontological Nurse Practitioner [facility] Care Management [phone]

## 2019-04-20 DIAGNOSIS — Z23 Encounter for immunization: Secondary | ICD-10-CM | POA: Diagnosis not present

## 2019-04-22 DIAGNOSIS — Z299 Encounter for prophylactic measures, unspecified: Secondary | ICD-10-CM | POA: Diagnosis not present

## 2019-04-22 DIAGNOSIS — Z681 Body mass index (BMI) 19 or less, adult: Secondary | ICD-10-CM | POA: Diagnosis not present

## 2019-04-22 DIAGNOSIS — E1165 Type 2 diabetes mellitus with hyperglycemia: Secondary | ICD-10-CM | POA: Diagnosis not present

## 2019-04-22 DIAGNOSIS — K922 Gastrointestinal hemorrhage, unspecified: Secondary | ICD-10-CM | POA: Diagnosis not present

## 2019-04-22 DIAGNOSIS — I10 Essential (primary) hypertension: Secondary | ICD-10-CM | POA: Diagnosis not present

## 2019-04-22 DIAGNOSIS — Z09 Encounter for follow-up examination after completed treatment for conditions other than malignant neoplasm: Secondary | ICD-10-CM | POA: Diagnosis not present

## 2019-04-22 DIAGNOSIS — I25119 Atherosclerotic heart disease of native coronary artery with unspecified angina pectoris: Secondary | ICD-10-CM | POA: Diagnosis not present

## 2019-04-25 ENCOUNTER — Other Ambulatory Visit (HOSPITAL_COMMUNITY)
Admission: RE | Admit: 2019-04-25 | Discharge: 2019-04-25 | Disposition: A | Discharge status: Home / Self Care | Payer: Medicare Other | Source: Ambulatory Visit | Attending: Cardiology | Admitting: Cardiology

## 2019-04-25 ENCOUNTER — Other Ambulatory Visit: Payer: Self-pay | Admitting: Cardiology

## 2019-04-25 ENCOUNTER — Other Ambulatory Visit: Payer: Self-pay | Admitting: *Deleted

## 2019-04-25 ENCOUNTER — Other Ambulatory Visit (INDEPENDENT_AMBULATORY_CARE_PROVIDER_SITE_OTHER): Payer: Self-pay | Admitting: Internal Medicine

## 2019-04-25 DIAGNOSIS — I5022 Chronic systolic (congestive) heart failure: Secondary | ICD-10-CM | POA: Insufficient documentation

## 2019-04-25 LAB — BASIC METABOLIC PANEL
Anion gap: 11 (ref 5–15)
BUN: 28 mg/dL — ABNORMAL HIGH (ref 8–23)
CO2: 29 mmol/L (ref 22–32)
Calcium: 9.9 mg/dL (ref 8.9–10.3)
Chloride: 98 mmol/L (ref 98–111)
Creatinine, Ser: 1.31 mg/dL — ABNORMAL HIGH (ref 0.44–1.00)
GFR calc Af Amer: 44 mL/min — ABNORMAL LOW (ref >60)
GFR calc non Af Amer: 38 mL/min — ABNORMAL LOW (ref >60)
Glucose, Bld: 101 mg/dL — ABNORMAL HIGH (ref 70–99)
Potassium: 4.3 mmol/L (ref 3.5–5.1)
Sodium: 138 mmol/L (ref 135–145)

## 2019-04-25 NOTE — Patient Outreach (Signed)
Telephone outreach:  Any HF sxs: minor SOB on exertion,   Daily wts: today 126.9 has lost wt steadily  Low Na diet: No added salt, using herbs, pepper   Taking meds: yes  HF Action Plan: Knows what to do daily. Knows her zones.  Advanced Directives: Discussed, pt says at this time she would want an initial attempt at CPR but she says she would not want to be maintained on life support. She has HCPOA. Advised she should talk to her niece and nephew about her thoughts on this. We will discuss at our next conversation.    Crystal [name]. Crystal Neither, MSN, [facility] Gerontological Nurse Practitioner [facility] Care Management [phone]

## 2019-04-27 ENCOUNTER — Telehealth: Payer: Self-pay | Admitting: *Deleted

## 2019-04-27 NOTE — Telephone Encounter (Signed)
-----   Message from Satira Sark, MD sent at 04/26/2019  4:54 PM EDT ----- Results reviewed.  Renal function remains stable, creatinine 1.31 and potassium also normal after recent medication adjustments.

## 2019-04-27 NOTE — Telephone Encounter (Signed)
Patient informed. Copy sent to PCP °

## 2019-05-12 ENCOUNTER — Ambulatory Visit: Payer: Medicare Other | Admitting: Cardiology

## 2019-05-13 ENCOUNTER — Encounter: Payer: Self-pay | Admitting: *Deleted

## 2019-05-13 ENCOUNTER — Other Ambulatory Visit: Payer: Self-pay | Admitting: *Deleted

## 2019-05-13 NOTE — Patient Outreach (Addendum)
[facility]) Care Management  05/13/2019  Arp [birth year] [phone]   Telephone outreach:  Crystal [name] is doing well. No signs of HF exacerbation. Her wt is now down to 126.8 and has reached a plateau where it goes up or down a pound of two only.  She has edema if she is up many hours during the day but it dissipates when she elevates her legs.   We completed a MOST form today. She does not want CPR and would want limited interventions otherwise.  She has met all her care plan goals as of today.  I will call her again in one month to follow up.  I am sending her a copy of her MOST form.  Crystal Pont. [name], MSN, [facility] Gerontological Nurse Practitioner [facility] Care Management [phone]

## 2019-05-17 DIAGNOSIS — K5792 Diverticulitis of intestine, part unspecified, without perforation or abscess without bleeding: Secondary | ICD-10-CM | POA: Diagnosis not present

## 2019-05-17 DIAGNOSIS — R5383 Other fatigue: Secondary | ICD-10-CM | POA: Diagnosis not present

## 2019-05-17 DIAGNOSIS — I10 Essential (primary) hypertension: Secondary | ICD-10-CM | POA: Diagnosis not present

## 2019-05-17 DIAGNOSIS — E1165 Type 2 diabetes mellitus with hyperglycemia: Secondary | ICD-10-CM | POA: Diagnosis not present

## 2019-05-17 DIAGNOSIS — Z299 Encounter for prophylactic measures, unspecified: Secondary | ICD-10-CM | POA: Diagnosis not present

## 2019-05-19 ENCOUNTER — Ambulatory Visit: Payer: Medicare Other | Admitting: Cardiology

## 2019-05-19 ENCOUNTER — Telehealth: Payer: Self-pay

## 2019-05-19 DIAGNOSIS — N1832 Chronic kidney disease, stage 3b: Secondary | ICD-10-CM | POA: Diagnosis not present

## 2019-05-19 DIAGNOSIS — R197 Diarrhea, unspecified: Secondary | ICD-10-CM | POA: Diagnosis not present

## 2019-05-19 NOTE — Telephone Encounter (Signed)
Pt states she does not understand her meds.  Please call [phone]   Thanks renee

## 2019-05-19 NOTE — Telephone Encounter (Signed)
Noted.  She mentioned seeing intermittent blood in her stools to me at her last office visit as well and I encouraged her to follow-up with her PCP Dr. Woody Seller.  Please make sure that she communicates with her PCP today about continued symptoms.  From a cardiac perspective she is on aspirin and Plavix following STEMI in January with DES intervention.  Ideally, we would not interrupt dual antiplatelet therapy at this time.  If she develops significant bleeding however or requires urgent GI evaluation, this may have to be reconsidered.

## 2019-05-19 NOTE — Telephone Encounter (Signed)
Pt called and c/o blood in stool. Pt states that she started seeing blood in her stool on yesterday. She began having diarrhea on Friday and was seen by PCP office for this and started on Flagyl and Cipro. Pt denies improvement of symptoms at this time. Pt instructed to also call PCP office. Please advise.

## 2019-05-19 NOTE — Telephone Encounter (Signed)
Pt notified and will call PCP.

## 2019-05-24 DIAGNOSIS — Z1231 Encounter for screening mammogram for malignant neoplasm of breast: Secondary | ICD-10-CM | POA: Diagnosis not present

## 2019-05-24 DIAGNOSIS — Z9011 Acquired absence of right breast and nipple: Secondary | ICD-10-CM | POA: Diagnosis not present

## 2019-05-27 ENCOUNTER — Other Ambulatory Visit: Payer: Self-pay

## 2019-05-27 ENCOUNTER — Encounter: Payer: Self-pay | Admitting: Cardiology

## 2019-05-27 ENCOUNTER — Ambulatory Visit (INDEPENDENT_AMBULATORY_CARE_PROVIDER_SITE_OTHER): Payer: Medicare Other | Admitting: Cardiology

## 2019-05-27 ENCOUNTER — Encounter (HOSPITAL_COMMUNITY): Payer: Medicare Other

## 2019-05-27 VITALS — BP 162/70 | HR 71 | Temp 96.9°F | Ht 65.0 in | Wt 128.0 lb

## 2019-05-27 DIAGNOSIS — I255 Ischemic cardiomyopathy: Secondary | ICD-10-CM | POA: Diagnosis not present

## 2019-05-27 DIAGNOSIS — I25119 Atherosclerotic heart disease of native coronary artery with unspecified angina pectoris: Secondary | ICD-10-CM

## 2019-05-27 DIAGNOSIS — N1832 Chronic kidney disease, stage 3b: Secondary | ICD-10-CM | POA: Diagnosis not present

## 2019-05-27 DIAGNOSIS — I200 Unstable angina: Secondary | ICD-10-CM

## 2019-05-27 NOTE — Progress Notes (Signed)
Cardiology Office Note  Date: 05/27/2019   ID: Crystal [name], DOB 1934/02/08, MRN [id number]  PCP:  Glenda Chroman, MD  Cardiologist:  Rozann Lesches, MD Electrophysiologist:  None   Chief Complaint  Patient presents with  . Cardiac follow-up    History of Present Illness: Crystal [name] is an 85 y.o. female last seen in March.  She presents for a routine visit.  She does not report any active angina symptoms and has stable NYHA class II dyspnea.  At the last visit she was switched from Brilinta to Plavix.  She was also started on Aldactone.  Follow-up lab work in late March showed potassium 4.3, creatinine 1.31.  She has had some trouble with intermittent loose stools/diarrhea, not seeing him blood or stools at this point.  Also rumbling and discomfort in her lower abdomen.  She has had evaluation by her PCP with medication adjustments and anticipates referral to GI.  I reviewed her current cardiac regimen and we discussed obtaining a follow-up echocardiogram to determine if any further adjustments need to be made.  I talked with her about anticipation of 1 year of aspirin and Plavix in light of ACS in January with DES intervention.  Past Medical History:  Diagnosis Date  . Bell's palsy   . Breast cancer ([place]) 1998   Right mastectomy  . CAD (coronary artery disease)    a. s/p STEMI in 01/2019 with DES to mid-LAD  . CHF (congestive heart failure) ([place])    a. EF 30-35% by echo in 01/2019  . Essential hypertension   . GERD (gastroesophageal reflux disease)   . Gout   . History of skin cancer    Squamous cell, left shoulder  . History of stroke   . Mixed hyperlipidemia   . Osteopenia   . Reflux esophagitis   . Thyroid nodule   . Type 2 diabetes mellitus ([place])     Past Surgical History:  Procedure Laterality Date  . ABDOMINAL HYSTERECTOMY    . CATARACT EXTRACTION  2016  . CORONARY/GRAFT ACUTE MI REVASCULARIZATION N/A 01/30/2019   Procedure: Coronary/Graft  Acute MI Revascularization;  Surgeon: Burnell Blanks, MD;  Location: [facility];  Service: Cardiovascular;  Laterality: N/A;  . LEFT HEART CATH AND CORONARY ANGIOGRAPHY N/A 01/30/2019   Procedure: LEFT HEART CATH AND CORONARY ANGIOGRAPHY;  Surgeon: Burnell Blanks, MD;  Location: [facility];  Service: Cardiovascular;  Laterality: N/A;  . Right mastectomy  1998   [place]    Current Outpatient Medications  Medication Sig Dispense Refill  . acetaminophen (TYLENOL) 500 MG tablet Take 500 mg by mouth every 6 (six) hours as needed for headache (pain).    . Alogliptin Benzoate 25 MG TABS Take 12.5 mg by mouth daily at 2 PM.    . aspirin EC 81 MG tablet Take 81 mg by mouth daily.    . Calcium Carbonate-Vitamin D (CALCIUM 500 + D PO) Take 1 tablet by mouth daily.     . candesartan (ATACAND) 16 MG tablet Take 1 tablet (16 mg total) by mouth daily. 30 tablet 1  . carvedilol (COREG) 25 MG tablet TAKE 1 TABLET TWICE DAILY WITH A MEAL. 60 tablet 6  . cholecalciferol (VITAMIN D3) 25 MCG (1000 UT) tablet Take 1,000 Units by mouth daily with breakfast.    . CINNAMON PO Take 1 capsule by mouth daily with supper.    . clopidogrel (PLAVIX) 75 MG tablet Take 1 tablet (75 mg  total) by mouth daily. 90 tablet 1  . furosemide (LASIX) 20 MG tablet Take 1 tablet (20 mg total) by mouth daily. 90 tablet 3  . isosorbide mononitrate (IMDUR) 30 MG 24 hr tablet TAKE 1 TABLET IN THE EVENING. 90 tablet 0  . Multiple Vitamin (MULTIVITAMIN WITH MINERALS) TABS tablet Take 1 tablet by mouth every morning.    . multivitamin-lutein (OCUVITE-LUTEIN) CAPS capsule Take 1 capsule by mouth every morning.     . nitroGLYCERIN (NITROSTAT) 0.4 MG SL tablet Place 1 tablet (0.4 mg total) under the tongue every 5 (five) minutes x 3 doses as needed for chest pain. 25 tablet 2  . rosuvastatin (CRESTOR) 5 MG tablet Take 1 tablet (5 mg total) by mouth daily. 90 tablet 3  . sodium chloride (OCEAN) 0.65 % SOLN nasal  spray Place 1 spray into both nostrils 2 (two) times daily as needed for congestion.     Marland Kitchen spironolactone (ALDACTONE) 25 MG tablet Take 0.5 tablets (12.5 mg total) by mouth daily. 45 tablet 1   No current facility-administered medications for this visit.   Allergies:  Bactrim [sulfamethoxazole-trimethoprim], Sulfa antibiotics, Amlodipine, Clonidine derivatives, Evista [raloxifene hcl], Fosamax [alendronate sodium], Glipizide, Lipitor [atorvastatin calcium], Losartan, Pravastatin, and Azithromycin   ROS:   No palpitations or syncope.  Physical Exam: VS:  BP (!) 162/70   Pulse 71   Temp (!) 96.9 F (36.1 C)   Ht 5\' 5"  (1.651 m)   Wt 128 lb (58.1 kg)   SpO2 99%   BMI 21.30 kg/m , BMI Body mass index is 21.3 kg/m.  Wt Readings from Last 3 Encounters:  05/27/19 128 lb (58.1 kg)  04/15/19 133 lb (60.3 kg)  03/10/19 135 lb 9.6 oz (61.5 kg)    General: Elderly woman, appears comfortable at rest. HEENT: Conjunctiva and lids normal, wearing a mask. Neck: Supple, no elevated JVP or carotid bruits, no thyromegaly. Lungs: Clear to auscultation, nonlabored breathing at rest. Cardiac: Regular rate and rhythm, no S3 or significant systolic murmur. Abdomen: Soft, bowel sounds present, no guarding or rebound. Extremities: No pitting edema, distal pulses 2+.  ECG:  An ECG dated 02/27/2019 was personally reviewed today and demonstrated:  Sinus tachycardia with nonspecific ST-T changes, possible lateral ischemia.  Recent Labwork: 02/27/2019: B Natriuretic Peptide 2,582.0 03/02/2019: Magnesium 1.8 04/04/2019: Hemoglobin 10.4; Platelets 306 04/13/2019: ALT 17; AST 21 04/25/2019: BUN 28; Creatinine, Ser 1.31; Potassium 4.3; Sodium 138     Component Value Date/Time   CHOL 155 04/13/2019 1118   TRIG 111 04/13/2019 1118   HDL 56 04/13/2019 1118   CHOLHDL 2.8 04/13/2019 1118   VLDL 22 04/13/2019 1118   LDLCALC 77 04/13/2019 1118    Other Studies Reviewed Today:  Echocardiogram 01/31/2019: 1. Left  ventricular ejection fraction, by visual estimation, is 30 to  35%. The left ventricle has moderately decreased function. There is no  left ventricular hypertrophy.  2. Severe akinesis of the left ventricular, apical apical segment.  3. Left ventricular diastolic parameters are consistent with Grade II  diastolic dysfunction (pseudonormalization).  4. The left ventricle demonstrates regional wall motion abnormalities.  5. Global right ventricle has normal systolic function.The right  ventricular size is normal. No increase in right ventricular wall  thickness.  6. Left atrial size was normal.  7. Right atrial size was normal.  8. The mitral valve is grossly normal. Mild mitral valve regurgitation.  9. The tricuspid valve is normal in structure.  10. The aortic valve is tricuspid. Aortic valve regurgitation  is trivial.  No evidence of aortic valve sclerosis or stenosis.  11. The pulmonic valve was normal in structure. Pulmonic valve  regurgitation is not visualized.  12. The atrial septum is grossly normal.   Cardiac catheterization 01/30/2019:  Prox RCA lesion is 60% stenosed.  Mid RCA lesion is 40% stenosed.  Prox Cx to Mid Cx lesion is 20% stenosed.  Mid LAD lesion is 99% stenosed.  A drug-eluting stent was successfully placed using a SYNERGY XD 3.0X28.  Post intervention, there is a 0% residual stenosis.  1. Severe stenosis mid LAD 2. Successful PTCA/DES x 1 mid LAD 3. Mild non-obstructive disease in the Circumflex 4. Moderate non-obstructive disease in the mid RCA  Recommendations: Will admit to the ICU. DAPT with ASA and Brilinta for one year. Continue home beta blocker. Echo in am. She is statin intolerant. Fast track discharge, possibly home tomorrow if stable.   Assessment and Plan:  1.  CAD status post STEMI in January with DES intervention to the mid LAD.  She is doing well without recurrent angina on medical therapy.  Continue aspirin and Plavix, Coreg,  Imdur, Atacand, and Crestor.  2.  Ischemic cardiomyopathy, LVEF 30 to 35% in January.  Plan to repeat echocardiogram to reassess LVEF.  For now continue Coreg, Atacand, and Aldactone.  3.  CKD stage III, recent creatinine 1.31 and potassium normal.  Medication Adjustments/Labs and Tests Ordered: Current medicines are reviewed at length with the patient today.  Concerns regarding medicines are outlined above.   Tests Ordered: Orders Placed This Encounter  Procedures  . ECHOCARDIOGRAM COMPLETE    Medication Changes: No orders of the defined types were placed in this encounter.   Disposition:  Follow up test results and determine disposition.  Signed, Satira Sark, MD, [facility] 05/27/2019 4:24 PM    [facility] [address] [place], [place] [address] Phone: [phone]; Fax: [phone]

## 2019-05-27 NOTE — Patient Instructions (Addendum)
Medication Instructions: Your physician recommends that you continue on your current medications as directed. Please refer to the Current Medication list given to you today.   Labwork: None today  Procedures/Testing: Your physician has requested that you have an echocardiogram in the [facility] office. Echocardiography is a painless test that uses sound waves to create images of your heart. It provides your doctor with information about the size and shape of your heart and how well your heart's chambers and valves are working. This procedure takes approximately one hour. There are no restrictions for this procedure.    Follow-Up: To be determined after echo.  Any Additional Special Instructions Will Be Listed Below (If Applicable).     If you need a refill on your cardiac medications before your next appointment, please call your pharmacy.       Thank you for choosing [facility] !

## 2019-06-07 ENCOUNTER — Encounter: Payer: Self-pay | Admitting: Internal Medicine

## 2019-06-10 ENCOUNTER — Other Ambulatory Visit: Payer: Self-pay | Admitting: *Deleted

## 2019-06-11 ENCOUNTER — Other Ambulatory Visit: Payer: Self-pay | Admitting: Cardiology

## 2019-06-13 ENCOUNTER — Ambulatory Visit (INDEPENDENT_AMBULATORY_CARE_PROVIDER_SITE_OTHER): Payer: Medicare Other

## 2019-06-13 ENCOUNTER — Other Ambulatory Visit: Payer: Self-pay

## 2019-06-13 ENCOUNTER — Other Ambulatory Visit: Payer: Self-pay | Admitting: *Deleted

## 2019-06-13 DIAGNOSIS — I255 Ischemic cardiomyopathy: Secondary | ICD-10-CM

## 2019-06-13 NOTE — Patient Outreach (Signed)
[facility]) Care Management  06/13/2019  Rawan Riendeau Griffey [birth year] [id number]  Mrs. Boffa called me back today. She reports she continues to have diarrhea which started mid April. She has been to see Dr. Woody Seller' Nurse practitioner. Pt was prescribed medication for possible diverticulitis (cipro/flagyl) this did not improve her diarrhea. She talked with Dr. Woody Seller and he suggested that she take Pepto Bismol and also gave her an Rx for lomotil. She says she doesn't know how much to take. Explained she should take the Pepto Bismol as instructed on the bottle. If this does not slow the diarrhea, she should take the lomotil as the prescriptions.  She says she wonders if it could be the Plavix that is causing the diarrhea. This is a possible side effect but she needs this as she did had an MI and angioplasty earlier this year.  Suggested the she stop eating blueberries every day. Also she can try Metamucil to add bulk and perhaps reduce her diarrhea.  Her GI consult is not scheduled until 07/06/19 and was requested as an urgent consult but this was the earliest appt possible. I will outreach to provider to see if she can be worked in anytime sooner.  Will call pt next week to see how the new suggestions have worked out for her.  [name]. [name], MSN, [name] Gerontological Nurse Practitioner [facility] Care Management [phone]

## 2019-06-14 ENCOUNTER — Telehealth: Payer: Self-pay | Admitting: Gastroenterology

## 2019-06-14 NOTE — Telephone Encounter (Signed)
Crystal [name], I received a request from another NP who works with Care Management to get patient in sooner for new patient appt than June. She is a new patient. Can we move her up to first available APP appt? Thanks!

## 2019-06-14 NOTE — Telephone Encounter (Signed)
Added patient to wait list, there are no more openings in may

## 2019-06-20 ENCOUNTER — Other Ambulatory Visit: Payer: Self-pay | Admitting: *Deleted

## 2019-06-20 NOTE — Patient Outreach (Signed)
[facility]) Care Management  06/20/2019  [place] [birth year] [id number]   Telephone outreach:  Crystal [name] is feeling very well. She reports the suggestions made last week have helped to reduce her watery stools down to 2 stools a day. She purchased metamucil fiber wafers and powder and is taking this everyday as directed. She has stopped eating blue berries.  She says she feels so much better and now she can leave the house without fear of having a bowel incontinence issue.  She will have her GI consult in 2 weeks. My associate has put Crystal [name] on the wait list for a cancellation.  Also suggested that she use vitamin A and D ointment if she gets any gaulded skin.  THN CM Care Plan Problem One     Most Recent Value  Care Plan Problem One  Knowledge deficit related to CHF  Role Documenting the Problem One  Care Management Coordinator  Care Plan for Problem One  Not Active  THN Long Term Goal   Pt will verbalize, demonstrate improved self care for CHF within 60 days  THN Long Term Goal Start Date  03/04/19  [facility] Long Term Goal Met Date  03/25/19  THN CM Short Term Goal #1   Pt will verbalize CHF action plan/ zones within 30 days  THN CM Short Term Goal #1 Start Date  03/04/19  [facility] CM Short Term Goal #1 Met Date  03/25/19  THN CM Short Term Goal #2   Pt will weigh daily and record within 30 days  THN CM Short Term Goal #2 Start Date  03/04/19  [facility] CM Short Term Goal #2 Met Date  03/25/19  THN CM Short Term Goal #3  Pt will verbalize foods high in sodium to limit/ avoid within 30 days  THN CM Short Term Goal #3 Start Date  03/04/19  [facility] CM Short Term Goal #3 Met Date  04/25/19    [facility] CM Care Plan Problem Two     Most Recent Value  Care Plan Problem Two  No Advanced Directives  Role Documenting the Problem Two  Care Management Coordinator  Care Plan for Problem Two  Not Active  THN CM Short Term Goal #1   Pt will complete a goals of care discussion  with me within the next 30 days and it will be documented in EPIC.  THN CM Short Term Goal #1 Start Date  04/18/19  [facility] CM Short Term Goal #1 Met Date   05/13/19    [facility] CM Care Plan Problem Three     Most Recent Value  Care Plan Problem Three  new problem diarrhea  Role Documenting the Problem Three  Care Management Coordinator  Care Plan for Problem Three  Active  THN CM Short Term Goal #1   Pt to follow instructions per MD and NP for prn medication for diarrhea and limiting certain foods over the next 30 days.  THN CM Short Term Goal #1 Start Date  06/13/19     I will call her again in one month.  [name]. [name], MSN, [facility] Gerontological Nurse Practitioner [facility] Care Management [phone]

## 2019-06-21 NOTE — Patient Outreach (Signed)
[facility]) Care Management  06/21/2019  Kaylie Ritter Gillock 1934/04/30 [id number]   Telephone assessment.   Mrs. Blish reports that since she has stopped eating blueberries and is using the metamucil biscuits her stools have decreased to only 2 per day. She is able to make it to the bathroom. She is relieved to not having so many watery stools.  She will have her GI assessment with Dr. Cyndi Bender on 07/06/19.  NP to follow up after this consult at the end of June.  [name]. [name], MSN, [facility] Gerontological Nurse Practitioner [facility] Care Management [phone]

## 2019-06-26 DIAGNOSIS — I10 Essential (primary) hypertension: Secondary | ICD-10-CM | POA: Diagnosis not present

## 2019-06-26 DIAGNOSIS — E119 Type 2 diabetes mellitus without complications: Secondary | ICD-10-CM | POA: Diagnosis not present

## 2019-06-29 ENCOUNTER — Other Ambulatory Visit (INDEPENDENT_AMBULATORY_CARE_PROVIDER_SITE_OTHER): Payer: Self-pay | Admitting: Internal Medicine

## 2019-07-01 ENCOUNTER — Other Ambulatory Visit (INDEPENDENT_AMBULATORY_CARE_PROVIDER_SITE_OTHER): Payer: Self-pay | Admitting: Internal Medicine

## 2019-07-04 ENCOUNTER — Other Ambulatory Visit: Payer: Self-pay | Admitting: *Deleted

## 2019-07-04 MED ORDER — CANDESARTAN CILEXETIL 16 MG PO TABS: 16.0000 mg | ORAL_TABLET | Freq: Every day | ORAL | 6 refills | Status: DC

## 2019-07-06 ENCOUNTER — Encounter: Payer: Self-pay | Admitting: Gastroenterology

## 2019-07-06 ENCOUNTER — Other Ambulatory Visit: Payer: Self-pay

## 2019-07-06 ENCOUNTER — Ambulatory Visit (INDEPENDENT_AMBULATORY_CARE_PROVIDER_SITE_OTHER): Payer: Medicare Other | Admitting: Gastroenterology

## 2019-07-06 DIAGNOSIS — R197 Diarrhea, unspecified: Secondary | ICD-10-CM

## 2019-07-06 DIAGNOSIS — I200 Unstable angina: Secondary | ICD-10-CM | POA: Diagnosis not present

## 2019-07-06 NOTE — Patient Instructions (Signed)
Continue the Metamucil cookies as you are doing, and you can take Imodium as needed. Call me with any worsening!  I recommend resuming the probiotic you were taking.  We will repeat blood work in 6 weeks, then see you back in 2 months.  Please call with any bleeding, worsening diarrhea, or abdominal pain!  It was a pleasure to see you today. I want to create trusting relationships with patients to provide genuine, compassionate, and quality care. I value your feedback. If you receive a survey regarding your visit,  I greatly appreciate you taking time to fill this out.   Annitta Needs, PhD, ANP-BC [facility] Gastroenterology

## 2019-07-06 NOTE — Progress Notes (Signed)
Primary Care Physician:  Glenda Chroman, MD  Referring Physician: Dr. Woody Seller Primary Gastroenterologist:  Dr. Gala Romney   Chief Complaint  Patient presents with  . Diarrhea    mostly daily, looks like some blood in stool at times. No nausea, no vomiting, no abdominal    HPI:   Crystal [name] is a delightful 85 y.o. female presenting today at the request of Dr. Woody Seller due to diarrhea.   She notes onset of diarrhea around February. Lomotil without much improvement. Pepto had helped. No antibiotics prior to onset of diarrhea. Had been prescribed abx for 1 week by PCP during diarrheal illness. Probiotic seemed to help. She is unsure of any medication changes. However, after patient left I did see where rosuvastatin was started in February. Pharmacist called her after the appt (the next day) and she will be taking a drug holiday for 2 weeks.   Found some metamucil cookies and eating one per day and taking Imodium as needed. Stools alternate each day. Stool used to be very watery but now more consistency but still loose. Baseline bowel habits before this was BM once per day. Taking metamucil cookies has helped consistency. Believes she has seen some blood in her stool. Has to be on Plavix for a year. Symptoms are morseo after she eats. Careful about sodium. Eats cereal in the morning with 2% milk. Got Fairlife milk on Monday but hasn't used. Didn't like almond milk. Ice cream occasionally. Had urgency with stools in the past. Believes may have had some blood with wiping. Stools had been dark on Brilinta. Now on Plavix.   Appetite is getting better, weight near her baseline.   Last colonoscopy 2019 by Dr. Anthony Sar. Reportedly normal in the records.   Outside labs in March 2021 with Hgb 10.5, Hct 32.6, improvement to 12.3 most recently in April 2021.   Past Medical History:  Diagnosis Date  . Bell's palsy   . Breast cancer ([place]) 1998   Right mastectomy  . CAD (coronary artery disease)    a. s/p  STEMI in 01/2019 with DES to mid-LAD  . CHF (congestive heart failure) ([place])    a. EF 30-35% by echo in 01/2019  . Essential hypertension   . GERD (gastroesophageal reflux disease)   . Gout   . History of skin cancer    Squamous cell, left shoulder  . History of stroke   . Mixed hyperlipidemia   . Osteopenia   . Reflux esophagitis   . Thyroid nodule   . Type 2 diabetes mellitus ([place])     Past Surgical History:  Procedure Laterality Date  . ABDOMINAL HYSTERECTOMY    . CATARACT EXTRACTION  2016  . CORONARY/GRAFT ACUTE MI REVASCULARIZATION N/A 01/30/2019   Procedure: Coronary/Graft Acute MI Revascularization;  Surgeon: Burnell Blanks, MD;  Location: [facility];  Service: Cardiovascular;  Laterality: N/A;  . LEFT HEART CATH AND CORONARY ANGIOGRAPHY N/A 01/30/2019   Procedure: LEFT HEART CATH AND CORONARY ANGIOGRAPHY;  Surgeon: Burnell Blanks, MD;  Location: [facility];  Service: Cardiovascular;  Laterality: N/A;  . Right mastectomy  1998   [facility]    Current Outpatient Medications  Medication Sig Dispense Refill  . acetaminophen (TYLENOL) 500 MG tablet Take 500 mg by mouth every 6 (six) hours as needed for headache (pain).    . Alogliptin Benzoate 25 MG TABS Take 12.5 mg by mouth daily at 2 PM.    . aspirin EC 81 MG  tablet Take 81 mg by mouth daily.    . Calcium Carbonate-Vitamin D (CALCIUM 500 + D PO) Take 1 tablet by mouth daily.     . candesartan (ATACAND) 16 MG tablet Take 1 tablet (16 mg total) by mouth daily. 30 tablet 6  . carvedilol (COREG) 25 MG tablet TAKE 1 TABLET TWICE DAILY WITH A MEAL. 60 tablet 6  . cholecalciferol (VITAMIN D3) 25 MCG (1000 UT) tablet Take 1,000 Units by mouth daily with breakfast.    . CINNAMON PO Take 1 capsule by mouth daily with supper.    . clopidogrel (PLAVIX) 75 MG tablet Take 1 tablet (75 mg total) by mouth daily. 90 tablet 1  . furosemide (LASIX) 20 MG tablet Take 1 tablet (20 mg total) by mouth daily. 90  tablet 3  . isosorbide mononitrate (IMDUR) 30 MG 24 hr tablet TAKE 1 TABLET EVERY EVENING. 90 tablet 1  . meclizine (ANTIVERT) 25 MG tablet Take 25 mg by mouth 2 (two) times daily as needed for dizziness.    . Multiple Vitamin (MULTIVITAMIN WITH MINERALS) TABS tablet Take 1 tablet by mouth every morning.    . multivitamin-lutein (OCUVITE-LUTEIN) CAPS capsule Take 1 capsule by mouth every morning.     . nitroGLYCERIN (NITROSTAT) 0.4 MG SL tablet Place 1 tablet (0.4 mg total) under the tongue every 5 (five) minutes x 3 doses as needed for chest pain. 25 tablet 2  . rosuvastatin (CRESTOR) 5 MG tablet Take 1 tablet (5 mg total) by mouth daily. 90 tablet 3  . sodium chloride (OCEAN) 0.65 % SOLN nasal spray Place 1 spray into both nostrils 2 (two) times daily as needed for congestion.     Marland Kitchen spironolactone (ALDACTONE) 25 MG tablet Take 0.5 tablets (12.5 mg total) by mouth daily. 45 tablet 1   No current facility-administered medications for this visit.    Allergies as of 07/06/2019 - Review Complete 07/06/2019  Allergen Reaction Noted  . Bactrim [sulfamethoxazole-trimethoprim] Nausea And Vomiting 06/02/2017  . Sulfa antibiotics Nausea And Vomiting 06/02/2017  . Amlodipine Other (See Comments) 06/02/2017  . Clonidine derivatives Other (See Comments) 06/02/2017  . Evista [raloxifene hcl] Other (See Comments) 06/02/2017  . Fosamax [alendronate sodium] Other (See Comments) 06/02/2017  . Glipizide Other (See Comments) 06/02/2017  . Lipitor [atorvastatin calcium] Other (See Comments) 06/02/2017  . Losartan Other (See Comments) 06/02/2017  . Pravastatin Other (See Comments) 06/02/2017  . Azithromycin Rash and Other (See Comments) 06/02/2017    Family History  Problem Relation Age of Onset  . Stroke Mother   . Heart attack Father   . Aneurysm Sister   . Heart attack Maternal Uncle   . Heart disease Maternal Uncle   . Heart attack Paternal Aunt   . Heart disease Paternal Aunt   . Heart attack  Paternal Grandfather   . Heart disease Paternal Grandfather   . Heart attack Paternal Aunt   . Heart disease Paternal Aunt   . Heart attack Maternal Uncle   . Heart disease Maternal Uncle   . Heart attack Maternal Uncle   . Heart disease Maternal Uncle   . Heart attack Sister   . Heart disease Sister   . Cancer Sister     Social History   Socioeconomic History  . Marital status: Divorced    Spouse name: Not on file  . Number of children: Not on file  . Years of education: Not on file  . Highest education level: Not on file  Occupational History  .  Not on file  Tobacco Use  . Smoking status: Never Smoker  . Smokeless tobacco: Never Used  Substance and Sexual Activity  . Alcohol use: Never  . Drug use: Never  . Sexual activity: Not on file  Other Topics Concern  . Not on file  Social History Narrative  . Not on file   Social Determinants of Health   Financial Resource Strain:   . Difficulty of Paying Living Expenses:   Food Insecurity:   . Worried About Charity fundraiser in the Last Year:   . Arboriculturist in the Last Year:   Transportation Needs:   . Film/video editor (Medical):   Marland Kitchen Lack of Transportation (Non-Medical):   Physical Activity:   . Days of Exercise per Week:   . Minutes of Exercise per Session:   Stress:   . Feeling of Stress :   Social Connections:   . Frequency of Communication with Friends and Family:   . Frequency of Social Gatherings with Friends and Family:   . Attends Religious Services:   . Active Member of Clubs or Organizations:   . Attends Archivist Meetings:   Marland Kitchen Marital Status:   Intimate Partner Violence:   . Fear of Current or Ex-Partner:   . Emotionally Abused:   Marland Kitchen Physically Abused:   . Sexually Abused:     Review of Systems: Gen: see HPI CV: Denies chest pain, heart palpitations, peripheral edema, syncope.  Resp: Denies shortness of breath at rest or with exertion. Denies wheezing or cough.  GI: see  HPI GU : Denies urinary burning, urinary frequency, urinary hesitancy MS: Denies joint pain, muscle weakness, cramps, or limitation of movement.  Derm: Denies rash, itching, dry skin Psych: Denies depression, anxiety, memory loss, and confusion Heme: see HPI  Physical Exam: BP (!) 186/70   Pulse 72   Temp (!) 97.2 F (36.2 C) (Oral)   Ht 5\' 5"  (1.651 m)   Wt 131 lb 3.2 oz (59.5 kg)   BMI 21.83 kg/m  General:   Alert and oriented. Pleasant and cooperative. Well-nourished and well-developed.  Head:  Normocephalic and atraumatic. Eyes:  Without icterus, sclera clear and conjunctiva pink.  Ears:  Normal auditory acuity. Mouth:  Mask in place Lungs:  Clear to auscultation bilaterally. Heart:  S1, S2 present without murmurs appreciated.  Abdomen:  +BS, soft, non-tender and non-distended. No HSM noted. No guarding or rebound. No masses appreciated.  Rectal:  Deferred  Msk:  Symmetrical without gross deformities. Normal posture. Extremities:  Without edema. Neurologic:  Alert and  oriented x4;  grossly normal neurologically. Psych:  Alert and cooperative. Normal mood and affect.  ASSESSMENT: Crystal [name] is a delightful 85 y.o. female presenting today with onset of diarrhea in February 2021 after starting rosuvastatin; she has noted improvement in consistency with addition of fiber and imodium as needed. Clinically, she has improved from baseline but still with alternating bowel habits. Doubt infectious etiology due to chronicity and query med effect.   Anemia: noted several months ago with normocytic in setting of CKD and now resolved as of April 2021. She is on Plavix with history of DES in Jan 2021. Will recheck CBC and iron studies in 4-6 weeks. Not a candidate for endoscopic intervention currently as she recently had DES placed; encouragingly, her last colonoscopy was in 2019. We will follow clinically for now.    PLAN:  Continue metamucil cookies, Imodium as needed  Resume  probiotic  Repeat CBC, iron studies in 6 weeks  Call if any worsening  Drug holiday off of rosuvastatin as planned  Return in 2 months or sooner if needed   Annitta Needs, PhD, ANP-BC [facility] Gastroenter[facility]

## 2019-07-07 ENCOUNTER — Telehealth: Payer: Self-pay | Admitting: Pharmacist

## 2019-07-07 NOTE — Telephone Encounter (Signed)
I was calling patient to set up lipid labs at [facility]. However upon review of chart it appears pt has been having diarrhea since Feb which was when we started rosuvastatin. I will suggest to patient that she take a drug holiday and see if diarrhea improves. Spoke with patient and she was in agreement to stop rosuvastatin for 2 weeks to see if improvement is made. I will call her in 2 weeks to follow up

## 2019-07-11 NOTE — Progress Notes (Signed)
Cc'ed to pcp °

## 2019-07-12 ENCOUNTER — Other Ambulatory Visit: Payer: Self-pay | Admitting: Emergency Medicine

## 2019-07-12 DIAGNOSIS — R197 Diarrhea, unspecified: Secondary | ICD-10-CM

## 2019-07-25 ENCOUNTER — Other Ambulatory Visit: Payer: Self-pay | Admitting: *Deleted

## 2019-07-25 NOTE — Patient Outreach (Signed)
[facility]) Care Management  07/25/2019  Crystal [name] October 09, 1934 [id number]  Telephone assessment.  Crystal [name] is doing very well. Her bowel issues have resolved with previous suggestions made and has seen her GI provider who added back her probiotic.  She is continuing to follow her HF action plan and her wt is stable, no SOB, no edema.  Will reduce call frequency now and will call her in 3 months.  [facility] CM Care Plan Problem One     Most Recent Value  Care Plan Problem One Chronic CHF  Role Documenting the Problem One Care Management Coordinator  Care Plan for Problem One Active  THN Long Term Goal  Pt will continue HF self monitoring, call MD for problems and avoid hospitalization over the next 90 days.  [facility]N Long Term Goal Start Date 07/25/19  [facility]N Long Term Goal Met Date 03/25/19  Interventions for Problem One Long Term Goal Pt is managing well, next contact will be in 90 days for follow up. Pt reminded she can call me for any problems or questions.  [facility] CM Short Term Goal #1  Pt will verbalize CHF action plan/ zones within 30 days  [facility]N CM Short Term Goal #1 Start Date 03/04/19  [facility] CM Short Term Goal #1 Met Date 03/25/19  [facility]N CM Short Term Goal #2  Pt will weigh daily and record within 30 days  [facility]N CM Short Term Goal #2 Start Date 03/04/19  [facility] CM Short Term Goal #2 Met Date 03/25/19  [facility] CM Short Term Goal #3 Pt will verbalize foods high in sodium to limit/ avoid within 30 days  [facility]N CM Short Term Goal #3 Start Date 03/04/19  [facility] CM Short Term Goal #3 Met Date 04/25/19    [facility] CM Care Plan Problem Two     Most Recent Value  Care Plan Problem Two No Advanced Directives  Role Documenting the Problem Two Care Management Coordinator  Care Plan for Problem Two Not Active  THN CM Short Term Goal #1  Pt will complete a goals of care discussion with me within the next 30 days and it will be documented in EPIC.  THN CM Short Term Goal #1 Start Date 04/18/19   [facility] CM Short Term Goal #1 Met Date  05/13/19    [facility] CM Care Plan Problem Three     Most Recent Value  Care Plan Problem Three new problem diarrhea  Role Documenting the Problem Three Care Management Coordinator  Care Plan for Problem Three Not Active  THN CM Short Term Goal #1  Pt to follow instructions per MD and NP for prn medication for diarrhea and limiting certain foods over the next 30 days.  [facility]N CM Short Term Goal #1 Start Date 06/13/19  [facility] CM Short Term Goal #1 Met Date 06/20/19     [name]. [name], MSN, [facility] Gerontological Nurse Practitioner [facility] Care Management [phone]

## 2019-07-26 ENCOUNTER — Telehealth: Payer: Self-pay | Admitting: Pharmacist

## 2019-07-26 NOTE — Telephone Encounter (Signed)
Spoke with patient. Diarrhea has stopped since stopping rosuvastatin. I did suggest that we try a low dose pravastatin. Patient not willing to try at this time. Not interested in injections. Intolerant to atorvastatin and zetia. She will continue healthy diet and let us know if she changes her mind.

## 2019-07-27 DIAGNOSIS — I10 Essential (primary) hypertension: Secondary | ICD-10-CM | POA: Diagnosis not present

## 2019-07-27 DIAGNOSIS — E119 Type 2 diabetes mellitus without complications: Secondary | ICD-10-CM | POA: Diagnosis not present

## 2019-08-08 ENCOUNTER — Other Ambulatory Visit: Payer: Self-pay | Admitting: Cardiology

## 2019-08-17 DIAGNOSIS — R197 Diarrhea, unspecified: Secondary | ICD-10-CM | POA: Diagnosis not present

## 2019-08-17 DIAGNOSIS — D649 Anemia, unspecified: Secondary | ICD-10-CM | POA: Diagnosis not present

## 2019-08-18 LAB — CBC WITH DIFFERENTIAL/PLATELET
Absolute Monocytes: 1186 cells/uL — ABNORMAL HIGH (ref 200–950)
Basophils Absolute: 54 cells/uL (ref 0–200)
Basophils Relative: 0.7 %
Eosinophils Absolute: 293 cells/uL (ref 15–500)
Eosinophils Relative: 3.8 %
HCT: 35.3 % (ref 35.0–45.0)
Hemoglobin: 11.5 g/dL — ABNORMAL LOW (ref 11.7–15.5)
Lymphs Abs: 2318 cells/uL (ref 850–3900)
MCH: 26.3 pg — ABNORMAL LOW (ref 27.0–33.0)
MCHC: 32.6 g/dL (ref 32.0–36.0)
MCV: 80.6 fL (ref 80.0–100.0)
MPV: 10.1 fL (ref 7.5–12.5)
Monocytes Relative: 15.4 %
Neutro Abs: 3850 cells/uL (ref 1500–7800)
Neutrophils Relative %: 50 %
Platelets: 354 10*3/uL (ref 140–400)
RBC: 4.38 10*6/uL (ref 3.80–5.10)
RDW: 14.3 % (ref 11.0–15.0)
Total Lymphocyte: 30.1 %
WBC: 7.7 10*3/uL (ref 3.8–10.8)

## 2019-08-18 LAB — IRON,TIBC AND FERRITIN PANEL
Ferritin: 40 ng/mL (ref 16–288)
Iron: 43 ug/dL — ABNORMAL LOW (ref 45–160)

## 2019-08-24 DIAGNOSIS — Z299 Encounter for prophylactic measures, unspecified: Secondary | ICD-10-CM | POA: Diagnosis not present

## 2019-08-24 DIAGNOSIS — E1122 Type 2 diabetes mellitus with diabetic chronic kidney disease: Secondary | ICD-10-CM | POA: Diagnosis not present

## 2019-08-24 DIAGNOSIS — N183 Chronic kidney disease, stage 3 unspecified: Secondary | ICD-10-CM | POA: Diagnosis not present

## 2019-08-24 DIAGNOSIS — E1165 Type 2 diabetes mellitus with hyperglycemia: Secondary | ICD-10-CM | POA: Diagnosis not present

## 2019-08-24 DIAGNOSIS — C50919 Malignant neoplasm of unspecified site of unspecified female breast: Secondary | ICD-10-CM | POA: Diagnosis not present

## 2019-08-24 DIAGNOSIS — I10 Essential (primary) hypertension: Secondary | ICD-10-CM | POA: Diagnosis not present

## 2019-08-26 ENCOUNTER — Other Ambulatory Visit: Payer: Self-pay

## 2019-08-26 DIAGNOSIS — E119 Type 2 diabetes mellitus without complications: Secondary | ICD-10-CM | POA: Diagnosis not present

## 2019-08-26 DIAGNOSIS — D509 Iron deficiency anemia, unspecified: Secondary | ICD-10-CM

## 2019-08-26 DIAGNOSIS — I10 Essential (primary) hypertension: Secondary | ICD-10-CM | POA: Diagnosis not present

## 2019-08-29 DIAGNOSIS — R21 Rash and other nonspecific skin eruption: Secondary | ICD-10-CM | POA: Diagnosis not present

## 2019-08-29 DIAGNOSIS — C50919 Malignant neoplasm of unspecified site of unspecified female breast: Secondary | ICD-10-CM | POA: Diagnosis not present

## 2019-08-29 DIAGNOSIS — Z299 Encounter for prophylactic measures, unspecified: Secondary | ICD-10-CM | POA: Diagnosis not present

## 2019-08-29 DIAGNOSIS — I10 Essential (primary) hypertension: Secondary | ICD-10-CM | POA: Diagnosis not present

## 2019-08-29 DIAGNOSIS — M8580 Other specified disorders of bone density and structure, unspecified site: Secondary | ICD-10-CM | POA: Diagnosis not present

## 2019-09-07 ENCOUNTER — Other Ambulatory Visit: Payer: Self-pay

## 2019-09-07 DIAGNOSIS — D509 Iron deficiency anemia, unspecified: Secondary | ICD-10-CM

## 2019-09-16 DIAGNOSIS — I10 Essential (primary) hypertension: Secondary | ICD-10-CM | POA: Diagnosis not present

## 2019-09-16 DIAGNOSIS — E119 Type 2 diabetes mellitus without complications: Secondary | ICD-10-CM | POA: Diagnosis not present

## 2019-09-21 ENCOUNTER — Other Ambulatory Visit: Payer: Self-pay

## 2019-09-21 ENCOUNTER — Ambulatory Visit (INDEPENDENT_AMBULATORY_CARE_PROVIDER_SITE_OTHER): Payer: Medicare Other | Admitting: Gastroenterology

## 2019-09-21 ENCOUNTER — Encounter: Payer: Self-pay | Admitting: Gastroenterology

## 2019-09-21 DIAGNOSIS — R1310 Dysphagia, unspecified: Secondary | ICD-10-CM | POA: Diagnosis not present

## 2019-09-21 DIAGNOSIS — I200 Unstable angina: Secondary | ICD-10-CM | POA: Diagnosis not present

## 2019-09-21 DIAGNOSIS — D649 Anemia, unspecified: Secondary | ICD-10-CM | POA: Diagnosis not present

## 2019-09-21 NOTE — H&P (View-Only) (Signed)
Referring Provider: Glenda Chroman, MD Primary Care Physician:  Glenda Chroman, MD  Primary GI: Dr. Gala Romney   Chief Complaint  Patient presents with  . Diarrhea    f/u, doing ok    HPI:   Crystal [name] is an 85 y.o. female presenting today with a history of onset of diarrhea in February 2021 after starting rosuvastatin; she had noted improvement in consistency with addition of fiber and imodium as needed when last seen June 2021. Normocytic anemia noted in setting of CKD months ago. On plavix with history of DES in Jan 2021. Colonoscopy in 2019 by Dr. Anthony Sar.   HGb 11.5 in July 2021, improved from prior. Ferritin low normal. Started on ferrous sulfate 325 mg daily. Felt to have component of IDA. CBC and iron studies due next month.   BM usually daily. Episode of diarrhea only every few weeks and will take Imodium. No hematochezia. Occasional abdominal cramping if needed to have a BM. Good appetite, thinks she is eating too much. Intermittent solid food dysphagia at times. New onset a few months ago.   BP at home 135/59.   Past Medical History:  Diagnosis Date  . Bell's palsy   . Breast cancer ([place]) 1998   Right mastectomy  . CAD (coronary artery disease)    a. s/p STEMI in 01/2019 with DES to mid-LAD  . CHF (congestive heart failure) ([place])    a. EF 30-35% by echo in 01/2019  . Essential hypertension   . GERD (gastroesophageal reflux disease)   . Gout   . History of skin cancer    Squamous cell, left shoulder  . Mixed hyperlipidemia   . Osteopenia   . Reflux esophagitis   . Thyroid nodule   . Type 2 diabetes mellitus ([place])     Past Surgical History:  Procedure Laterality Date  . CATARACT EXTRACTION  2016  . CORONARY/GRAFT ACUTE MI REVASCULARIZATION N/A 01/30/2019   Procedure: Coronary/Graft Acute MI Revascularization;  Surgeon: Burnell Blanks, MD;  Location: [facility];  Service: Cardiovascular;  Laterality: N/A;  . HYSTEROSCOPY    . LEFT HEART CATH  AND CORONARY ANGIOGRAPHY N/A 01/30/2019   Procedure: LEFT HEART CATH AND CORONARY ANGIOGRAPHY;  Surgeon: Burnell Blanks, MD;  Location: [facility];  Service: Cardiovascular;  Laterality: N/A;  . Right mastectomy  1998   [place]    Current Outpatient Medications  Medication Sig Dispense Refill  . acetaminophen (TYLENOL) 500 MG tablet Take 500 mg by mouth every 6 (six) hours as needed for headache (pain).    . Alogliptin Benzoate 12.5 MG TABS Take 1 tablet by mouth daily.     . Alogliptin Benzoate 25 MG TABS Take 12.5 mg by mouth daily at 2 PM.    . aspirin EC 81 MG tablet Take 81 mg by mouth daily.    . Calcium Carbonate-Vitamin D (CALCIUM 500 + D PO) Take 1 tablet by mouth daily.     . candesartan (ATACAND) 16 MG tablet Take 1 tablet (16 mg total) by mouth daily. 30 tablet 6  . carvedilol (COREG) 25 MG tablet TAKE 1 TABLET TWICE DAILY WITH A MEAL. 60 tablet 6  . cholecalciferol (VITAMIN D3) 25 MCG (1000 UT) tablet Take 1,000 Units by mouth daily with breakfast.    . CINNAMON PO Take 1 capsule by mouth daily with supper.    . clopidogrel (PLAVIX) 75 MG tablet TAKE 1 TABLET BY MOUTH DAILY--STOP BRILLINTA. 90 tablet  1  . furosemide (LASIX) 20 MG tablet Take 1 tablet (20 mg total) by mouth daily. 90 tablet 3  . isosorbide mononitrate (IMDUR) 30 MG 24 hr tablet TAKE 1 TABLET EVERY EVENING. 90 tablet 1  . meclizine (ANTIVERT) 25 MG tablet Take 25 mg by mouth 2 (two) times daily as needed for dizziness.    . Multiple Vitamin (MULTIVITAMIN WITH MINERALS) TABS tablet Take 1 tablet by mouth every morning.    . multivitamin-lutein (OCUVITE-LUTEIN) CAPS capsule Take 1 capsule by mouth every morning.     . nitroGLYCERIN (NITROSTAT) 0.4 MG SL tablet Place 1 tablet (0.4 mg total) under the tongue every 5 (five) minutes x 3 doses as needed for chest pain. 25 tablet 2  . saccharomyces boulardii (FLORASTOR) 250 MG capsule Take 250 mg by mouth 2 (two) times daily.    . sodium chloride (OCEAN)  0.65 % SOLN nasal spray Place 1 spray into both nostrils 2 (two) times daily as needed for congestion.     Marland Kitchen spironolactone (ALDACTONE) 25 MG tablet Take 0.5 tablets (12.5 mg total) by mouth daily. 45 tablet 1   No current facility-administered medications for this visit.    Allergies as of 09/21/2019 - Review Complete 09/21/2019  Allergen Reaction Noted  . Bactrim [sulfamethoxazole-trimethoprim] Nausea And Vomiting 06/02/2017  . Sulfa antibiotics Nausea And Vomiting 06/02/2017  . Amlodipine Other (See Comments) 06/02/2017  . Clonidine derivatives Other (See Comments) 06/02/2017  . Evista [raloxifene hcl] Other (See Comments) 06/02/2017  . Fosamax [alendronate sodium] Other (See Comments) 06/02/2017  . Glipizide Other (See Comments) 06/02/2017  . Lipitor [atorvastatin calcium] Other (See Comments) 06/02/2017  . Losartan Other (See Comments) 06/02/2017  . Pravastatin Other (See Comments) 06/02/2017  . Azithromycin Rash and Other (See Comments) 06/02/2017    Family History  Problem Relation Age of Onset  . Stroke Mother   . Heart attack Father   . Aneurysm Sister   . Heart attack Maternal Uncle   . Heart disease Maternal Uncle   . Heart attack Paternal Aunt   . Heart disease Paternal Aunt   . Heart attack Paternal Grandfather   . Heart disease Paternal Grandfather   . Heart attack Paternal Aunt   . Heart disease Paternal Aunt   . Heart attack Maternal Uncle   . Heart disease Maternal Uncle   . Heart attack Maternal Uncle   . Heart disease Maternal Uncle   . Heart attack Sister   . Heart disease Sister   . Cancer Sister   . Colon cancer Neg Hx   . Colon polyps Neg Hx     Social History   Socioeconomic History  . Marital status: Divorced    Spouse name: Not on file  . Number of children: Not on file  . Years of education: Not on file  . Highest education level: Not on file  Occupational History  . Not on file  Tobacco Use  . Smoking status: Never Smoker  .  Smokeless tobacco: Never Used  Vaping Use  . Vaping Use: Never used  Substance and Sexual Activity  . Alcohol use: Never  . Drug use: Never  . Sexual activity: Not on file  Other Topics Concern  . Not on file  Social History Narrative  . Not on file   Social Determinants of Health   Financial Resource Strain:   . Difficulty of Paying Living Expenses: Not on file  Food Insecurity:   . Worried About Charity fundraiser in  the Last Year: Not on file  . Ran Out of Food in the Last Year: Not on file  Transportation Needs:   . Lack of Transportation (Medical): Not on file  . Lack of Transportation (Non-Medical): Not on file  Physical Activity:   . Days of Exercise per Week: Not on file  . Minutes of Exercise per Session: Not on file  Stress:   . Feeling of Stress : Not on file  Social Connections:   . Frequency of Communication with Friends and Family: Not on file  . Frequency of Social Gatherings with Friends and Family: Not on file  . Attends Religious Services: Not on file  . Active Member of Clubs or Organizations: Not on file  . Attends Archivist Meetings: Not on file  . Marital Status: Not on file    Review of Systems: Gen: Denies fever, chills, anorexia. Denies fatigue, weakness, weight loss.  CV: Denies chest pain, palpitations, syncope, peripheral edema, and claudication. Resp: Denies dyspnea at rest, cough, wheezing, coughing up blood, and pleurisy. GI: see HPI Derm: Denies rash, itching, dry skin Psych: Denies depression, anxiety, memory loss, confusion. No homicidal or suicidal ideation.  Heme: Denies bruising, bleeding, and enlarged lymph nodes.  Physical Exam: BP (!) 178/71   Pulse 67   Temp (!) 97.1 F (36.2 C) (Temporal)   Ht 5\' 5"  (1.651 m)   Wt 132 lb 12.8 oz (60.2 kg)   BMI 22.10 kg/m  General:   Alert and oriented. No distress noted. Pleasant and cooperative.  Head:  Normocephalic and atraumatic. Eyes:  Conjuctiva clear without scleral  icterus. Mouth:  Mask in place  Abdomen:  +BS, soft, non-tender and non-distended. No rebound or guarding. No HSM or masses noted. Msk:  Symmetrical without gross deformities. Normal posture. Extremities:  Without edema. Neurologic:  Alert and  oriented x4 Psych:  Alert and cooperative. Normal mood and affect.  ASSESSMENT: Crystal [name] is an 85 y.o. female presenting today in routine follow-up with history of diarrhea in Feb 2021 that has now resolved, with improvement in bowel habits taking fiber daily. Mild normocytic anemia noted in setting of CKD, with possible IDA component as ferritin low normal. Colonoscopy in 2019 by Dr Anthony Sar.   History of DES in January 2021, on Plavix and aspirin. Established with Dr. Domenic Polite.  Now with new onset solid food dysphagia, no odynophagia starting in interim from last visit. In light of stent placement early this year on aspirin and Plavix, would ideally wait a year before endoscopic procedures. However, will reach out to Dr. Domenic Polite. Will not need to hold Plavix prior.    PLAN:  Repeat CBC, iron studies next month as planned  With new onset dysphagia, needs further evaluation. Reaching out to cardiology.   6 month follow-up regardless.  Annitta Needs, PhD, ANP-BC [facility]    Addendum: Dr. Domenic Polite aware. Medically stable from cardiac standpoint. Will pursue EGD/dilation in near future with Propofol. Risks and benefits discussed with patient.  Annitta Needs, PhD, ANP-BC [facility] Gastroenterology

## 2019-09-21 NOTE — Patient Instructions (Signed)
I will reach out to Dr. Domenic Polite about the procedure.   Please complete blood work as planned next month.  We will see you in 6 months regardless!  I enjoyed seeing you again today! As you know, I value our relationship and want to provide genuine, compassionate, and quality care. I welcome your feedback. If you receive a survey regarding your visit,  I greatly appreciate you taking time to fill this out. See you next time!  Annitta Needs, PhD, ANP-BC [facility] Gastroenterology

## 2019-09-21 NOTE — Progress Notes (Addendum)
Referring Provider: Glenda Chroman, MD Primary Care Physician:  Glenda Chroman, MD  Primary GI: Dr. Gala Romney   Chief Complaint  Patient presents with  . Diarrhea    f/u, doing ok    HPI:   Crystal [name] is an 85 y.o. female presenting today with a history of onset of diarrhea in February 2021 after starting rosuvastatin; she had noted improvement in consistency with addition of fiber and imodium as needed when last seen June 2021. Normocytic anemia noted in setting of CKD months ago. On plavix with history of DES in Jan 2021. Colonoscopy in 2019 by Dr. Anthony Sar.   HGb 11.5 in July 2021, improved from prior. Ferritin low normal. Started on ferrous sulfate 325 mg daily. Felt to have component of IDA. CBC and iron studies due next month.   BM usually daily. Episode of diarrhea only every few weeks and will take Imodium. No hematochezia. Occasional abdominal cramping if needed to have a BM. Good appetite, thinks she is eating too much. Intermittent solid food dysphagia at times. New onset a few months ago.   BP at home 135/59.   Past Medical History:  Diagnosis Date  . Bell's palsy   . Breast cancer ([place]) 1998   Right mastectomy  . CAD (coronary artery disease)    a. s/p STEMI in 01/2019 with DES to mid-LAD  . CHF (congestive heart failure) ([place])    a. EF 30-35% by echo in 01/2019  . Essential hypertension   . GERD (gastroesophageal reflux disease)   . Gout   . History of skin cancer    Squamous cell, left shoulder  . Mixed hyperlipidemia   . Osteopenia   . Reflux esophagitis   . Thyroid nodule   . Type 2 diabetes mellitus ([place])     Past Surgical History:  Procedure Laterality Date  . CATARACT EXTRACTION  2016  . CORONARY/GRAFT ACUTE MI REVASCULARIZATION N/A 01/30/2019   Procedure: Coronary/Graft Acute MI Revascularization;  Surgeon: Burnell Blanks, MD;  Location: [facility];  Service: Cardiovascular;  Laterality: N/A;  . HYSTEROSCOPY    . LEFT HEART CATH  AND CORONARY ANGIOGRAPHY N/A 01/30/2019   Procedure: LEFT HEART CATH AND CORONARY ANGIOGRAPHY;  Surgeon: Burnell Blanks, MD;  Location: [facility];  Service: Cardiovascular;  Laterality: N/A;  . Right mastectomy  1998   [place]    Current Outpatient Medications  Medication Sig Dispense Refill  . acetaminophen (TYLENOL) 500 MG tablet Take 500 mg by mouth every 6 (six) hours as needed for headache (pain).    . Alogliptin Benzoate 12.5 MG TABS Take 1 tablet by mouth daily.     . Alogliptin Benzoate 25 MG TABS Take 12.5 mg by mouth daily at 2 PM.    . aspirin EC 81 MG tablet Take 81 mg by mouth daily.    . Calcium Carbonate-Vitamin D (CALCIUM 500 + D PO) Take 1 tablet by mouth daily.     . candesartan (ATACAND) 16 MG tablet Take 1 tablet (16 mg total) by mouth daily. 30 tablet 6  . carvedilol (COREG) 25 MG tablet TAKE 1 TABLET TWICE DAILY WITH A MEAL. 60 tablet 6  . cholecalciferol (VITAMIN D3) 25 MCG (1000 UT) tablet Take 1,000 Units by mouth daily with breakfast.    . CINNAMON PO Take 1 capsule by mouth daily with supper.    . clopidogrel (PLAVIX) 75 MG tablet TAKE 1 TABLET BY MOUTH DAILY--STOP BRILLINTA. 90 tablet  1  . furosemide (LASIX) 20 MG tablet Take 1 tablet (20 mg total) by mouth daily. 90 tablet 3  . isosorbide mononitrate (IMDUR) 30 MG 24 hr tablet TAKE 1 TABLET EVERY EVENING. 90 tablet 1  . meclizine (ANTIVERT) 25 MG tablet Take 25 mg by mouth 2 (two) times daily as needed for dizziness.    . Multiple Vitamin (MULTIVITAMIN WITH MINERALS) TABS tablet Take 1 tablet by mouth every morning.    . multivitamin-lutein (OCUVITE-LUTEIN) CAPS capsule Take 1 capsule by mouth every morning.     . nitroGLYCERIN (NITROSTAT) 0.4 MG SL tablet Place 1 tablet (0.4 mg total) under the tongue every 5 (five) minutes x 3 doses as needed for chest pain. 25 tablet 2  . saccharomyces boulardii (FLORASTOR) 250 MG capsule Take 250 mg by mouth 2 (two) times daily.    . sodium chloride (OCEAN)  0.65 % SOLN nasal spray Place 1 spray into both nostrils 2 (two) times daily as needed for congestion.     Marland Kitchen spironolactone (ALDACTONE) 25 MG tablet Take 0.5 tablets (12.5 mg total) by mouth daily. 45 tablet 1   No current facility-administered medications for this visit.    Allergies as of 09/21/2019 - Review Complete 09/21/2019  Allergen Reaction Noted  . Bactrim [sulfamethoxazole-trimethoprim] Nausea And Vomiting 06/02/2017  . Sulfa antibiotics Nausea And Vomiting 06/02/2017  . Amlodipine Other (See Comments) 06/02/2017  . Clonidine derivatives Other (See Comments) 06/02/2017  . Evista [raloxifene hcl] Other (See Comments) 06/02/2017  . Fosamax [alendronate sodium] Other (See Comments) 06/02/2017  . Glipizide Other (See Comments) 06/02/2017  . Lipitor [atorvastatin calcium] Other (See Comments) 06/02/2017  . Losartan Other (See Comments) 06/02/2017  . Pravastatin Other (See Comments) 06/02/2017  . Azithromycin Rash and Other (See Comments) 06/02/2017    Family History  Problem Relation Age of Onset  . Stroke Mother   . Heart attack Father   . Aneurysm Sister   . Heart attack Maternal Uncle   . Heart disease Maternal Uncle   . Heart attack Paternal Aunt   . Heart disease Paternal Aunt   . Heart attack Paternal Grandfather   . Heart disease Paternal Grandfather   . Heart attack Paternal Aunt   . Heart disease Paternal Aunt   . Heart attack Maternal Uncle   . Heart disease Maternal Uncle   . Heart attack Maternal Uncle   . Heart disease Maternal Uncle   . Heart attack Sister   . Heart disease Sister   . Cancer Sister   . Colon cancer Neg Hx   . Colon polyps Neg Hx     Social History   Socioeconomic History  . Marital status: Divorced    Spouse name: Not on file  . Number of children: Not on file  . Years of education: Not on file  . Highest education level: Not on file  Occupational History  . Not on file  Tobacco Use  . Smoking status: Never Smoker  .  Smokeless tobacco: Never Used  Vaping Use  . Vaping Use: Never used  Substance and Sexual Activity  . Alcohol use: Never  . Drug use: Never  . Sexual activity: Not on file  Other Topics Concern  . Not on file  Social History Narrative  . Not on file   Social Determinants of Health   Financial Resource Strain:   . Difficulty of Paying Living Expenses: Not on file  Food Insecurity:   . Worried About Charity fundraiser in  the Last Year: Not on file  . Ran Out of Food in the Last Year: Not on file  Transportation Needs:   . Lack of Transportation (Medical): Not on file  . Lack of Transportation (Non-Medical): Not on file  Physical Activity:   . Days of Exercise per Week: Not on file  . Minutes of Exercise per Session: Not on file  Stress:   . Feeling of Stress : Not on file  Social Connections:   . Frequency of Communication with Friends and Family: Not on file  . Frequency of Social Gatherings with Friends and Family: Not on file  . Attends Religious Services: Not on file  . Active Member of Clubs or Organizations: Not on file  . Attends Archivist Meetings: Not on file  . Marital Status: Not on file    Review of Systems: Gen: Denies fever, chills, anorexia. Denies fatigue, weakness, weight loss.  CV: Denies chest pain, palpitations, syncope, peripheral edema, and claudication. Resp: Denies dyspnea at rest, cough, wheezing, coughing up blood, and pleurisy. GI: see HPI Derm: Denies rash, itching, dry skin Psych: Denies depression, anxiety, memory loss, confusion. No homicidal or suicidal ideation.  Heme: Denies bruising, bleeding, and enlarged lymph nodes.  Physical Exam: BP (!) 178/71   Pulse 67   Temp (!) 97.1 F (36.2 C) (Temporal)   Ht 5\' 5"  (1.651 m)   Wt 132 lb 12.8 oz (60.2 kg)   BMI 22.10 kg/m  General:   Alert and oriented. No distress noted. Pleasant and cooperative.  Head:  Normocephalic and atraumatic. Eyes:  Conjuctiva clear without scleral  icterus. Mouth:  Mask in place  Abdomen:  +BS, soft, non-tender and non-distended. No rebound or guarding. No HSM or masses noted. Msk:  Symmetrical without gross deformities. Normal posture. Extremities:  Without edema. Neurologic:  Alert and  oriented x4 Psych:  Alert and cooperative. Normal mood and affect.  ASSESSMENT: Crystal [name] is an 85 y.o. female presenting today in routine follow-up with history of diarrhea in Feb 2021 that has now resolved, with improvement in bowel habits taking fiber daily. Mild normocytic anemia noted in setting of CKD, with possible IDA component as ferritin low normal. Colonoscopy in 2019 by Dr Anthony Sar.   History of DES in January 2021, on Plavix and aspirin. Established with Dr. Domenic Polite.  Now with new onset solid food dysphagia, no odynophagia starting in interim from last visit. In light of stent placement early this year on aspirin and Plavix, would ideally wait a year before endoscopic procedures. However, will reach out to Dr. Domenic Polite. Will not need to hold Plavix prior.    PLAN:  Repeat CBC, iron studies next month as planned  With new onset dysphagia, needs further evaluation. Reaching out to cardiology.   6 month follow-up regardless.  Annitta Needs, PhD, ANP-BC [facility]    Addendum: Dr. Domenic Polite aware. Medically stable from cardiac standpoint. Will pursue EGD/dilation in near future with Propofol. Risks and benefits discussed with patient.  Annitta Needs, PhD, ANP-BC [facility]

## 2019-09-26 DIAGNOSIS — I10 Essential (primary) hypertension: Secondary | ICD-10-CM | POA: Diagnosis not present

## 2019-09-26 DIAGNOSIS — E1165 Type 2 diabetes mellitus with hyperglycemia: Secondary | ICD-10-CM | POA: Diagnosis not present

## 2019-09-26 DIAGNOSIS — G4700 Insomnia, unspecified: Secondary | ICD-10-CM | POA: Diagnosis not present

## 2019-09-26 DIAGNOSIS — Z299 Encounter for prophylactic measures, unspecified: Secondary | ICD-10-CM | POA: Diagnosis not present

## 2019-09-26 DIAGNOSIS — E1122 Type 2 diabetes mellitus with diabetic chronic kidney disease: Secondary | ICD-10-CM | POA: Diagnosis not present

## 2019-09-27 ENCOUNTER — Telehealth: Payer: Self-pay | Admitting: Gastroenterology

## 2019-09-27 NOTE — Telephone Encounter (Addendum)
Called pt, EGD/DIL w/Prop w/Dr. Gala Romney scheduled for 10/13/19 at 2:30pm. Pt informed to stay on Plavix and Aspirin. COVID test 10/12/19 at 10:15am. Orders entered. Appt letter mailed with procedure instructions.

## 2019-09-27 NOTE — Telephone Encounter (Signed)
Please let patient know we can proceed with EGD/dilation.  Needs EGD/dilation by Dr. Gala Romney with Propofol. ASA II. Reason: dysphagia. Stay on plavix and aspirin.

## 2019-09-28 ENCOUNTER — Other Ambulatory Visit: Payer: Self-pay | Admitting: Cardiology

## 2019-10-05 ENCOUNTER — Other Ambulatory Visit: Payer: Self-pay | Admitting: Cardiology

## 2019-10-12 ENCOUNTER — Other Ambulatory Visit: Payer: Self-pay

## 2019-10-12 ENCOUNTER — Other Ambulatory Visit (HOSPITAL_COMMUNITY)
Admission: RE | Admit: 2019-10-12 | Discharge: 2019-10-12 | Disposition: A | Discharge status: Home / Self Care | Payer: Medicare Other | Source: Ambulatory Visit | Attending: Internal Medicine | Admitting: Internal Medicine

## 2019-10-12 DIAGNOSIS — Z85828 Personal history of other malignant neoplasm of skin: Secondary | ICD-10-CM | POA: Diagnosis not present

## 2019-10-12 DIAGNOSIS — D631 Anemia in chronic kidney disease: Secondary | ICD-10-CM | POA: Diagnosis not present

## 2019-10-12 DIAGNOSIS — Z7984 Long term (current) use of oral hypoglycemic drugs: Secondary | ICD-10-CM | POA: Diagnosis not present

## 2019-10-12 DIAGNOSIS — R1310 Dysphagia, unspecified: Secondary | ICD-10-CM | POA: Diagnosis not present

## 2019-10-12 DIAGNOSIS — Z9011 Acquired absence of right breast and nipple: Secondary | ICD-10-CM | POA: Diagnosis not present

## 2019-10-12 DIAGNOSIS — Z01812 Encounter for preprocedural laboratory examination: Secondary | ICD-10-CM | POA: Diagnosis not present

## 2019-10-12 DIAGNOSIS — Z955 Presence of coronary angioplasty implant and graft: Secondary | ICD-10-CM | POA: Diagnosis not present

## 2019-10-12 DIAGNOSIS — N189 Chronic kidney disease, unspecified: Secondary | ICD-10-CM | POA: Diagnosis not present

## 2019-10-12 DIAGNOSIS — K2100 Gastro-esophageal reflux disease with esophagitis, without bleeding: Secondary | ICD-10-CM | POA: Diagnosis not present

## 2019-10-12 DIAGNOSIS — Z7982 Long term (current) use of aspirin: Secondary | ICD-10-CM | POA: Diagnosis not present

## 2019-10-12 DIAGNOSIS — I2510 Atherosclerotic heart disease of native coronary artery without angina pectoris: Secondary | ICD-10-CM | POA: Diagnosis not present

## 2019-10-12 DIAGNOSIS — Z79899 Other long term (current) drug therapy: Secondary | ICD-10-CM | POA: Diagnosis not present

## 2019-10-12 DIAGNOSIS — E1151 Type 2 diabetes mellitus with diabetic peripheral angiopathy without gangrene: Secondary | ICD-10-CM | POA: Diagnosis not present

## 2019-10-12 DIAGNOSIS — Z853 Personal history of malignant neoplasm of breast: Secondary | ICD-10-CM | POA: Diagnosis not present

## 2019-10-12 DIAGNOSIS — I509 Heart failure, unspecified: Secondary | ICD-10-CM | POA: Diagnosis not present

## 2019-10-12 DIAGNOSIS — K319 Disease of stomach and duodenum, unspecified: Secondary | ICD-10-CM | POA: Diagnosis not present

## 2019-10-12 DIAGNOSIS — K222 Esophageal obstruction: Secondary | ICD-10-CM | POA: Diagnosis not present

## 2019-10-12 DIAGNOSIS — E1122 Type 2 diabetes mellitus with diabetic chronic kidney disease: Secondary | ICD-10-CM | POA: Diagnosis not present

## 2019-10-12 DIAGNOSIS — Z7902 Long term (current) use of antithrombotics/antiplatelets: Secondary | ICD-10-CM | POA: Diagnosis not present

## 2019-10-12 DIAGNOSIS — I252 Old myocardial infarction: Secondary | ICD-10-CM | POA: Diagnosis not present

## 2019-10-12 DIAGNOSIS — Z20822 Contact with and (suspected) exposure to covid-19: Secondary | ICD-10-CM | POA: Diagnosis not present

## 2019-10-12 DIAGNOSIS — I130 Hypertensive heart and chronic kidney disease with heart failure and stage 1 through stage 4 chronic kidney disease, or unspecified chronic kidney disease: Secondary | ICD-10-CM | POA: Diagnosis not present

## 2019-10-12 LAB — BASIC METABOLIC PANEL
Anion gap: 10 (ref 5–15)
BUN: 35 mg/dL — ABNORMAL HIGH (ref 8–23)
CO2: 23 mmol/L (ref 22–32)
Calcium: 9.6 mg/dL (ref 8.9–10.3)
Chloride: 96 mmol/L — ABNORMAL LOW (ref 98–111)
Creatinine, Ser: 1.34 mg/dL — ABNORMAL HIGH (ref 0.44–1.00)
GFR calc Af Amer: 43 mL/min — ABNORMAL LOW (ref >60)
GFR calc non Af Amer: 37 mL/min — ABNORMAL LOW (ref >60)
Glucose, Bld: 106 mg/dL — ABNORMAL HIGH (ref 70–99)
Potassium: 4.6 mmol/L (ref 3.5–5.1)
Sodium: 129 mmol/L — ABNORMAL LOW (ref 135–145)

## 2019-10-12 LAB — SARS CORONAVIRUS 2 (TAT 6-24 HRS): SARS Coronavirus 2: NEGATIVE

## 2019-10-13 ENCOUNTER — Encounter (HOSPITAL_COMMUNITY)
Admission: RE | Disposition: A | Discharge status: Home / Self Care | Payer: Self-pay | Source: Home / Self Care | Attending: Internal Medicine

## 2019-10-13 ENCOUNTER — Ambulatory Visit (HOSPITAL_COMMUNITY): Payer: Medicare Other | Admitting: Anesthesiology

## 2019-10-13 ENCOUNTER — Ambulatory Visit (HOSPITAL_COMMUNITY)
Admission: RE | Admit: 2019-10-13 | Discharge: 2019-10-13 | Disposition: A | Discharge status: Home / Self Care | Payer: Medicare Other | Attending: Internal Medicine | Admitting: Internal Medicine

## 2019-10-13 ENCOUNTER — Other Ambulatory Visit: Payer: Self-pay

## 2019-10-13 ENCOUNTER — Encounter (HOSPITAL_COMMUNITY): Payer: Self-pay | Admitting: Internal Medicine

## 2019-10-13 DIAGNOSIS — Z7984 Long term (current) use of oral hypoglycemic drugs: Secondary | ICD-10-CM | POA: Insufficient documentation

## 2019-10-13 DIAGNOSIS — Z7982 Long term (current) use of aspirin: Secondary | ICD-10-CM | POA: Insufficient documentation

## 2019-10-13 DIAGNOSIS — E1122 Type 2 diabetes mellitus with diabetic chronic kidney disease: Secondary | ICD-10-CM | POA: Insufficient documentation

## 2019-10-13 DIAGNOSIS — K222 Esophageal obstruction: Secondary | ICD-10-CM | POA: Diagnosis not present

## 2019-10-13 DIAGNOSIS — I509 Heart failure, unspecified: Secondary | ICD-10-CM | POA: Insufficient documentation

## 2019-10-13 DIAGNOSIS — I252 Old myocardial infarction: Secondary | ICD-10-CM | POA: Diagnosis not present

## 2019-10-13 DIAGNOSIS — K922 Gastrointestinal hemorrhage, unspecified: Secondary | ICD-10-CM

## 2019-10-13 DIAGNOSIS — D631 Anemia in chronic kidney disease: Secondary | ICD-10-CM | POA: Insufficient documentation

## 2019-10-13 DIAGNOSIS — E119 Type 2 diabetes mellitus without complications: Secondary | ICD-10-CM | POA: Diagnosis not present

## 2019-10-13 DIAGNOSIS — I110 Hypertensive heart disease with heart failure: Secondary | ICD-10-CM | POA: Diagnosis not present

## 2019-10-13 DIAGNOSIS — Z9011 Acquired absence of right breast and nipple: Secondary | ICD-10-CM | POA: Insufficient documentation

## 2019-10-13 DIAGNOSIS — I130 Hypertensive heart and chronic kidney disease with heart failure and stage 1 through stage 4 chronic kidney disease, or unspecified chronic kidney disease: Secondary | ICD-10-CM | POA: Insufficient documentation

## 2019-10-13 DIAGNOSIS — K2100 Gastro-esophageal reflux disease with esophagitis, without bleeding: Secondary | ICD-10-CM | POA: Insufficient documentation

## 2019-10-13 DIAGNOSIS — Z853 Personal history of malignant neoplasm of breast: Secondary | ICD-10-CM | POA: Insufficient documentation

## 2019-10-13 DIAGNOSIS — Z85828 Personal history of other malignant neoplasm of skin: Secondary | ICD-10-CM | POA: Insufficient documentation

## 2019-10-13 DIAGNOSIS — Z955 Presence of coronary angioplasty implant and graft: Secondary | ICD-10-CM | POA: Insufficient documentation

## 2019-10-13 DIAGNOSIS — N189 Chronic kidney disease, unspecified: Secondary | ICD-10-CM | POA: Diagnosis not present

## 2019-10-13 DIAGNOSIS — R1310 Dysphagia, unspecified: Secondary | ICD-10-CM | POA: Insufficient documentation

## 2019-10-13 DIAGNOSIS — K319 Disease of stomach and duodenum, unspecified: Secondary | ICD-10-CM | POA: Insufficient documentation

## 2019-10-13 DIAGNOSIS — E1151 Type 2 diabetes mellitus with diabetic peripheral angiopathy without gangrene: Secondary | ICD-10-CM | POA: Diagnosis not present

## 2019-10-13 DIAGNOSIS — Z79899 Other long term (current) drug therapy: Secondary | ICD-10-CM | POA: Insufficient documentation

## 2019-10-13 DIAGNOSIS — I2510 Atherosclerotic heart disease of native coronary artery without angina pectoris: Secondary | ICD-10-CM | POA: Diagnosis not present

## 2019-10-13 DIAGNOSIS — Z7902 Long term (current) use of antithrombotics/antiplatelets: Secondary | ICD-10-CM | POA: Insufficient documentation

## 2019-10-13 HISTORY — PX: ESOPHAGOGASTRODUODENOSCOPY (EGD) WITH PROPOFOL: SHX5813

## 2019-10-13 HISTORY — PX: BIOPSY: SHX5522

## 2019-10-13 HISTORY — PX: MALONEY DILATION: SHX5535

## 2019-10-13 LAB — GLUCOSE, CAPILLARY
Glucose-Capillary: 108 mg/dL — ABNORMAL HIGH (ref 70–99)
Glucose-Capillary: 109 mg/dL — ABNORMAL HIGH (ref 70–99)

## 2019-10-13 SURGERY — ESOPHAGOGASTRODUODENOSCOPY (EGD) WITH PROPOFOL
Anesthesia: General

## 2019-10-13 MED ORDER — GLYCOPYRROLATE 0.2 MG/ML IJ SOLN
INTRAMUSCULAR | Status: AC
  Administered 2019-10-13: 0.2 mg via INTRAVENOUS
  Filled 2019-10-13: qty 1

## 2019-10-13 MED ORDER — LIDOCAINE 2% (20 MG/ML) 5 ML SYRINGE
INTRAMUSCULAR | Status: AC
  Filled 2019-10-13: qty 20

## 2019-10-13 MED ORDER — LIDOCAINE HCL (CARDIAC) PF 100 MG/5ML IV SOSY
PREFILLED_SYRINGE | INTRAVENOUS | Status: DC | PRN
  Administered 2019-10-13: 100 mg via INTRATRACHEAL

## 2019-10-13 MED ORDER — CHLORHEXIDINE GLUCONATE CLOTH 2 % EX PADS: 6.0000 | MEDICATED_PAD | Freq: Once | CUTANEOUS | Status: DC

## 2019-10-13 MED ORDER — LACTATED RINGERS IV SOLN: Freq: Once | INTRAVENOUS | Status: AC

## 2019-10-13 MED ORDER — STERILE WATER FOR IRRIGATION IR SOLN
Status: DC | PRN
  Administered 2019-10-13: 100 mL

## 2019-10-13 MED ORDER — PROPOFOL 10 MG/ML IV BOLUS
INTRAVENOUS | Status: DC | PRN
  Administered 2019-10-13: 80 mg via INTRAVENOUS
  Administered 2019-10-13: 125 ug/kg/min via INTRAVENOUS

## 2019-10-13 MED ORDER — LACTATED RINGERS IV SOLN: INTRAVENOUS | Status: DC | PRN

## 2019-10-13 MED ORDER — KETAMINE HCL 50 MG/5ML IJ SOSY
PREFILLED_SYRINGE | INTRAMUSCULAR | Status: AC
  Filled 2019-10-13: qty 5

## 2019-10-13 MED ORDER — LIDOCAINE VISCOUS HCL 2 % MT SOLN: 15.0000 mL | Freq: Once | OROMUCOSAL | Status: AC

## 2019-10-13 MED ORDER — PROPOFOL 10 MG/ML IV BOLUS
INTRAVENOUS | Status: AC
  Filled 2019-10-13: qty 120

## 2019-10-13 MED ORDER — GLYCOPYRROLATE 0.2 MG/ML IJ SOLN: 0.2000 mg | Freq: Once | INTRAMUSCULAR | Status: AC

## 2019-10-13 MED ORDER — LIDOCAINE VISCOUS HCL 2 % MT SOLN
OROMUCOSAL | Status: AC
  Administered 2019-10-13: 15 mL via OROMUCOSAL
  Filled 2019-10-13: qty 15

## 2019-10-13 NOTE — Anesthesia Postprocedure Evaluation (Signed)
Anesthesia Post Note  Patient: Crystal [name]  Procedure(s) Performed: ESOPHAGOGASTRODUODENOSCOPY (EGD) WITH PROPOFOL (N/A ) MALONEY DILATION (N/A ) BIOPSY  Patient location during evaluation: PACU Anesthesia Type: General Level of consciousness: awake, oriented, patient cooperative and awake and alert Pain management: pain level controlled Vital Signs Assessment: post-procedure vital signs reviewed and stable Respiratory status: spontaneous breathing, nonlabored ventilation and respiratory function stable Cardiovascular status: blood pressure returned to baseline and stable Postop Assessment: no headache and no backache Anesthetic complications: no   No complications documented.   Last Vitals:  Vitals:   10/13/19 0904  BP: (!) 219/72  Resp: 10  Temp: 36.8 C  SpO2: 99%    Last Pain:  Vitals:   10/13/19 1003  TempSrc:   PainSc: 0-No pain                 Tacy Learn

## 2019-10-13 NOTE — Interval H&P Note (Signed)
History and Physical Interval Note:  10/13/2019 9:23 AM  Crystal [name]  has presented today for surgery, with the diagnosis of dysphagia.  The various methods of treatment have been discussed with the patient and family. After consideration of risks, benefits and other options for treatment, the patient has consented to  Procedure(s) with comments: ESOPHAGOGASTRODUODENOSCOPY (EGD) WITH PROPOFOL (N/A) - 2:30pm MALONEY DILATION (N/A) as a surgical intervention.  The patient's history has been reviewed, patient examined, no change in status, stable for surgery.  I have reviewed the patient's chart and labs.  Questions were answered to the patient's satisfaction.     Crystal [name]   No change.  EGD with possible esophageal dilation as feasible/appropriate today per plan.  She is on Plavix.  Procedure okayed by Dr. Domenic Polite.  The risks, benefits, limitations, alternatives and imponderables have been reviewed with the patient. Potential for esophageal dilation, biopsy, etc. have also been reviewed.  Questions have been answered. All parties agreeable.

## 2019-10-13 NOTE — Discharge Instructions (Signed)
EGD Discharge instructions Please read the instructions outlined below and refer to this sheet in the next few weeks. These discharge instructions provide you with general information on caring for yourself after you leave the hospital. Your doctor may also give you specific instructions. While your treatment has been planned according to the most current medical practices available, unavoidable complications occasionally occur. If you have any problems or questions after discharge, please call your doctor. ACTIVITY  You may resume your regular activity but move at a slower pace for the next 24 hours.   Take frequent rest periods for the next 24 hours.   Walking will help expel (get rid of) the air and reduce the bloated feeling in your abdomen.   No driving for 24 hours (because of the anesthesia (medicine) used during the test).   You may shower.   Do not sign any important legal documents or operate any machinery for 24 hours (because of the anesthesia used during the test).  NUTRITION  Drink plenty of fluids.   You may resume your normal diet.   Begin with a light meal and progress to your normal diet.   Avoid alcoholic beverages for 24 hours or as instructed by your caregiver.  MEDICATIONS  You may resume your normal medications unless your caregiver tells you otherwise.  WHAT YOU CAN EXPECT TODAY  You may experience abdominal discomfort such as a feeling of fullness or "gas" pains.  FOLLOW-UP  Your doctor will discuss the results of your test with you.  SEEK IMMEDIATE MEDICAL ATTENTION IF ANY OF THE FOLLOWING OCCUR:  Excessive nausea (feeling sick to your stomach) and/or vomiting.   Severe abdominal pain and distention (swelling).   Trouble swallowing.   Temperature over 101 F (37.8 C).   Rectal bleeding or vomiting of blood.   Your esophagus was stretched today  Your stomach had some inflammation.  Biopsies were taken.  Further recommendations to follow  pending review of pathology report  Office visit with [place] in 3 months  At patient request, called [name] at [phone] -reviewed results and recommendations     Monitored Anesthesia Care, Care After These instructions provide you with information about caring for yourself after your procedure. Your health care provider may also give you more specific instructions. Your treatment has been planned according to current medical practices, but problems sometimes occur. Call your health care provider if you have any problems or questions after your procedure. What can I expect after the procedure? After your procedure, you may:  Feel sleepy for several hours.  Feel clumsy and have poor balance for several hours.  Feel forgetful about what happened after the procedure.  Have poor judgment for several hours.  Feel nauseous or vomit.  Have a sore throat if you had a breathing tube during the procedure. Follow these instructions at home: For at least 24 hours after the procedure:      Have a responsible adult stay with you. It is important to have someone help care for you until you are awake and alert.  Rest as needed.  Do not: ? Participate in activities in which you could fall or become injured. ? Drive. ? Use heavy machinery. ? Drink alcohol. ? Take sleeping pills or medicines that cause drowsiness. ? Make important decisions or sign legal documents. ? Take care of children on your own. Eating and drinking  Follow the diet that is recommended by your health care provider.  If you vomit, drink water, juice,  or soup when you can drink without vomiting.  Make sure you have little or no nausea before eating solid foods. General instructions  Take over-the-counter and prescription medicines only as told by your health care provider.  If you have sleep apnea, surgery and certain medicines can increase your risk for breathing problems. Follow instructions from your  health care provider about wearing your sleep device: ? Anytime you are sleeping, including during daytime naps. ? While taking prescription pain medicines, sleeping medicines, or medicines that make you drowsy.  If you smoke, do not smoke without supervision.  Keep all follow-up visits as told by your health care provider. This is important. Contact a health care provider if:  You keep feeling nauseous or you keep vomiting.  You feel light-headed.  You develop a rash.  You have a fever. Get help right away if:  You have trouble breathing. Summary  For several hours after your procedure, you may feel sleepy and have poor judgment.  Have a responsible adult stay with you for at least 24 hours or until you are awake and alert. This information is not intended to replace advice given to you by your health care provider. Make sure you discuss any questions you have with your health care provider. Document Revised: 04/13/2017 Document Reviewed: 05/06/2015 Elsevier Patient Education  [place].

## 2019-10-13 NOTE — Op Note (Signed)
[facility] Patient Name: Crystal [name] Procedure Date: 10/13/2019 8:58 AM MRN: [id number] Date of Birth: Feb 19, 1934 Attending MD: Norvel Richards , MD CSN: [id number] Age: 85 Admit Type: Outpatient Procedure:                Upper GI endoscopy Indications:              Dysphagia Providers:                Norvel Richards, MD, Crystal Page, [place]                            Risa Grill, Technician, Aram Candela Referring MD:              Medicines:                Propofol per Anesthesia Complications:            No immediate complications. Estimated Blood Loss:     Estimated blood loss: none. Estimated blood loss                            was minimal. Procedure:                Pre-Anesthesia Assessment:                           - Prior to the procedure, a History and Physical                            was performed, and patient medications and                            allergies were reviewed. The patient's tolerance of                            previous anesthesia was also reviewed. The risks                            and benefits of the procedure and the sedation                            options and risks were discussed with the patient.                            All questions were answered, and informed consent                            was obtained. Prior Anticoagulants: The patient                            last took Plavix (clopidogrel) 1 day prior to the                            procedure. ASA Grade Assessment: III - A patient  with severe systemic disease. After reviewing the                            risks and benefits, the patient was deemed in                            satisfactory condition to undergo the procedure.                           After obtaining informed consent, the endoscope was                            passed under direct vision. Throughout the                            procedure, the patient's  blood pressure, pulse, and                            oxygen saturations were monitored continuously. The                            GIF-H190 ([id number]) was introduced through the                            mouth, and advanced to the second part of duodenum.                            The upper GI endoscopy was accomplished without                            difficulty. The patient tolerated the procedure                            well. Scope In: 10:07:33 AM Scope Out: 10:13:35 AM Total Procedure Duration: 0 hours 6 minutes 2 seconds  Findings:      A non-obstructing Schatzki ring was found at the gastroesophageal       junction.      Multiple localized 3 mm erosions with stigmata of recent bleeding were       found in the gastric antrum.      The duodenal bulb and second portion of the duodenum were normal. The       scope was withdrawn. Dilation was performed with a Maloney dilator with       mild resistance at 62 Fr. The scope was withdrawn. Dilation was       performed with a Maloney dilator with mild resistance at 56 Fr. The       dilation site was examined following endoscope reinsertion and showed no       change. Finally, the abnormal antrum was biopsied with a cold forceps       for histology. Estimated blood loss was minimal. Estimated blood loss       was minimal. Impression:               - Non-obstructing Schatzki ring. Dilated.                           -  Erosive gastropathy with stigmata of recent                            bleeding. S/P bx                           - Normal duodenal bulb and second portion of the                            duodenum. Moderate Sedation:      Moderate (conscious) sedation was personally administered by an       anesthesia professional. The following parameters were monitored: oxygen       saturation, heart rate, blood pressure, respiratory rate, EKG, adequacy       of pulmonary ventilation, and response to care. Recommendation:            - Patient has a contact number available for                            emergencies. The signs and symptoms of potential                            delayed complications were discussed with the                            patient. Return to normal activities tomorrow.                            Written discharge instructions were provided to the                            patient.                           - Advance diet as tolerated.                           - Continue present medications.                           - Await pathology results.                           - Return to my office in 3 months. Procedure Code(s):        --- Professional ---                           [id number], Esophagogastroduodenoscopy, flexible,                            transoral; with biopsy, single or multiple                           43450, Dilation of esophagus, by unguided sound or                            bougie, single  or multiple passes Diagnosis Code(s):        --- Professional ---                           K22.2, Esophageal obstruction                           K92.2, Gastrointestinal hemorrhage, unspecified                           R13.10, Dysphagia, unspecified CPT copyright 2019 American Medical Association. All rights reserved. The codes documented in this report are preliminary and upon coder review may  be revised to meet current compliance requirements. Cristopher Estimable. Kody Brandl, MD Norvel Richards, MD 10/13/2019 10:26:02 AM This report has been signed electronically. Number of Addenda: 0

## 2019-10-13 NOTE — Transfer of Care (Signed)
Immediate Anesthesia Transfer of Care Note  Patient: Crystal [name]  Procedure(s) Performed: ESOPHAGOGASTRODUODENOSCOPY (EGD) WITH PROPOFOL (N/A ) MALONEY DILATION (N/A ) BIOPSY  Patient Location: PACU  Anesthesia Type:General  Level of Consciousness: awake, alert , oriented and patient cooperative  Airway & Oxygen Therapy: Patient Spontanous Breathing  Post-op Assessment: Report given to RN, Post -op Vital signs reviewed and stable and Patient moving all extremities  Post vital signs: Reviewed and stable  Last Vitals:  Vitals Value Taken Time  BP    Temp    Pulse    Resp 16 10/13/19 1019  SpO2    Vitals shown include unvalidated device data.  Last Pain:  Vitals:   10/13/19 1003  TempSrc:   PainSc: 0-No pain      Patients Stated Pain Goal: 8 (36/14/43 1540)  Complications: No complications documented.

## 2019-10-13 NOTE — Anesthesia Preprocedure Evaluation (Signed)
Anesthesia Evaluation  Patient identified by MRN, date of birth, ID band Patient awake    Reviewed: Allergy & Precautions, NPO status , Patient's Chart, lab work & pertinent test results, reviewed documented beta blocker date and time   History of Anesthesia Complications Negative for: history of anesthetic complications  Airway Mallampati: III  TM Distance: >3 FB Neck ROM: Full    Dental  (+) Caps, Dental Advisory Given   Pulmonary shortness of breath and with exertion,    Pulmonary exam normal breath sounds clear to auscultation       Cardiovascular Exercise Tolerance: Poor hypertension, Pt. on medications and Pt. on home beta blockers + CAD, + Past MI, + Cardiac Stents (01/2019), + Peripheral Vascular Disease, +CHF and + DOE  Normal cardiovascular exam Rhythm:Regular Rate:Normal  1. Left ventricular ejection fraction, by estimation, is 45 to 50%. The  left ventricle has mildly decreased function. The left ventricle  demonstrates regional wall motion abnormalities (see scoring  diagram/findings for description). Left ventricular  diastolic parameters are consistent with Grade I diastolic dysfunction  (impaired relaxation). Elevated left ventricular end-diastolic pressure.  2. Right ventricular systolic function is mildly reduced. The right  ventricular size is normal. There is normal pulmonary artery systolic  pressure.  3. Left atrial size was mildly dilated.  4. The mitral valve is degenerative. Mild mitral valve regurgitation.  5. Tricuspid valve regurgitation is mild to moderate.  6. The aortic valve was not well visualized. Aortic valve regurgitation  is mild. No aortic stenosis is present.  7. The inferior vena cava is normal in size with greater than 50%  respiratory variability, suggesting right atrial pressure of 3 mmHg.   Comparison(s): Echocardiogram done 01/31/19 showed an EF of 30-35%.    Neuro/Psych   Neuromuscular disease    GI/Hepatic GERD  Medicated and Controlled,  Endo/Other  diabetes, Well Controlled, Type 2, Oral Hypoglycemic Agents  Renal/GU Renal InsufficiencyRenal disease     Musculoskeletal negative musculoskeletal ROS (+)   Abdominal   Peds  Hematology  (+) anemia ,   Anesthesia Other Findings   Reproductive/Obstetrics negative OB ROS                             Anesthesia Physical Anesthesia Plan  ASA: III  Anesthesia Plan: General   Post-op Pain Management:    Induction: Intravenous  PONV Risk Score and Plan: TIVA  Airway Management Planned: Nasal Cannula and Natural Airway  Additional Equipment:   Intra-op Plan:   Post-operative Plan:   Informed Consent: I have reviewed the patients History and Physical, chart, labs and discussed the procedure including the risks, benefits and alternatives for the proposed anesthesia with the patient or authorized representative who has indicated his/her understanding and acceptance.       Plan Discussed with: CRNA and Surgeon  Anesthesia Plan Comments:         Anesthesia Quick Evaluation

## 2019-10-14 ENCOUNTER — Other Ambulatory Visit: Payer: Self-pay

## 2019-10-14 LAB — SURGICAL PATHOLOGY

## 2019-10-18 ENCOUNTER — Encounter: Payer: Self-pay | Admitting: Internal Medicine

## 2019-10-18 ENCOUNTER — Encounter (HOSPITAL_COMMUNITY): Payer: Self-pay | Admitting: Internal Medicine

## 2019-10-24 ENCOUNTER — Other Ambulatory Visit: Payer: Self-pay | Admitting: *Deleted

## 2019-10-24 NOTE — Patient Outreach (Signed)
[facility] [facility]) Care Management  10/24/2019  [place] 03/06/1934 [id number]   Telephone outreach (quarterly) for follow up HF, GI  [name] is doing very well. She is following her HF Action Plan daily management and has not been in the [place]. She will see her cardiologist in November.  She had an EGD 9/16. Her esophagus was stretched and several biopsies were taken. She is able to swallow much easier now. She takes her probiotic when she remembers it (has to be kept in refrigerator) which is not where her daily meds are kept.  She has more formed stools now that she is taking a metamucil wafer.  Encouraged to continue her daily regimen and call MD on NP if she has questions.  We agreed to talk again in December.  [name]. [name], MSN, [facility] Gerontological Nurse Practitioner [facility] Care Management [phone]

## 2019-10-26 DIAGNOSIS — D509 Iron deficiency anemia, unspecified: Secondary | ICD-10-CM | POA: Diagnosis not present

## 2019-10-27 DIAGNOSIS — E119 Type 2 diabetes mellitus without complications: Secondary | ICD-10-CM | POA: Diagnosis not present

## 2019-10-27 DIAGNOSIS — I10 Essential (primary) hypertension: Secondary | ICD-10-CM | POA: Diagnosis not present

## 2019-10-27 LAB — IRON,TIBC AND FERRITIN PANEL
%SAT: 24 % (calc) (ref 16–45)
Ferritin: 64 ng/mL (ref 16–288)
Iron: 90 ug/dL (ref 45–160)
TIBC: 379 mcg/dL (calc) (ref 250–450)

## 2019-10-27 LAB — CBC WITH DIFFERENTIAL/PLATELET
Absolute Monocytes: 1000 cells/uL — ABNORMAL HIGH (ref 200–950)
Basophils Absolute: 59 cells/uL (ref 0–200)
Basophils Relative: 0.7 %
Eosinophils Absolute: 328 cells/uL (ref 15–500)
Eosinophils Relative: 3.9 %
HCT: 33.1 % — ABNORMAL LOW (ref 35.0–45.0)
Hemoglobin: 11.2 g/dL — ABNORMAL LOW (ref 11.7–15.5)
Lymphs Abs: 2377 cells/uL (ref 850–3900)
MCH: 27.9 pg (ref 27.0–33.0)
MCHC: 33.8 g/dL (ref 32.0–36.0)
MCV: 82.5 fL (ref 80.0–100.0)
MPV: 10.3 fL (ref 7.5–12.5)
Monocytes Relative: 11.9 %
Neutro Abs: 4637 cells/uL (ref 1500–7800)
Neutrophils Relative %: 55.2 %
Platelets: 315 10*3/uL (ref 140–400)
RBC: 4.01 10*6/uL (ref 3.80–5.10)
RDW: 14.3 % (ref 11.0–15.0)
Total Lymphocyte: 28.3 %
WBC: 8.4 10*3/uL (ref 3.8–10.8)

## 2019-11-02 ENCOUNTER — Other Ambulatory Visit: Payer: Self-pay

## 2019-11-02 DIAGNOSIS — D509 Iron deficiency anemia, unspecified: Secondary | ICD-10-CM

## 2019-11-25 DIAGNOSIS — I10 Essential (primary) hypertension: Secondary | ICD-10-CM | POA: Diagnosis not present

## 2019-11-25 DIAGNOSIS — E119 Type 2 diabetes mellitus without complications: Secondary | ICD-10-CM | POA: Diagnosis not present

## 2019-11-28 ENCOUNTER — Other Ambulatory Visit: Payer: Self-pay | Admitting: Cardiology

## 2019-11-30 DIAGNOSIS — E1122 Type 2 diabetes mellitus with diabetic chronic kidney disease: Secondary | ICD-10-CM | POA: Diagnosis not present

## 2019-11-30 DIAGNOSIS — I10 Essential (primary) hypertension: Secondary | ICD-10-CM | POA: Diagnosis not present

## 2019-11-30 DIAGNOSIS — E1165 Type 2 diabetes mellitus with hyperglycemia: Secondary | ICD-10-CM | POA: Diagnosis not present

## 2019-11-30 DIAGNOSIS — N183 Chronic kidney disease, stage 3 unspecified: Secondary | ICD-10-CM | POA: Diagnosis not present

## 2019-11-30 DIAGNOSIS — Z299 Encounter for prophylactic measures, unspecified: Secondary | ICD-10-CM | POA: Diagnosis not present

## 2019-12-09 ENCOUNTER — Other Ambulatory Visit: Payer: Self-pay | Admitting: Cardiology

## 2019-12-19 ENCOUNTER — Encounter: Payer: Self-pay | Admitting: Cardiology

## 2019-12-19 ENCOUNTER — Ambulatory Visit (INDEPENDENT_AMBULATORY_CARE_PROVIDER_SITE_OTHER): Payer: Medicare Other | Admitting: Cardiology

## 2019-12-19 VITALS — BP 170/78 | HR 64 | Ht 65.0 in | Wt 137.0 lb

## 2019-12-19 DIAGNOSIS — I255 Ischemic cardiomyopathy: Secondary | ICD-10-CM | POA: Diagnosis not present

## 2019-12-19 DIAGNOSIS — I25119 Atherosclerotic heart disease of native coronary artery with unspecified angina pectoris: Secondary | ICD-10-CM | POA: Diagnosis not present

## 2019-12-19 DIAGNOSIS — Z79899 Other long term (current) drug therapy: Secondary | ICD-10-CM | POA: Diagnosis not present

## 2019-12-19 DIAGNOSIS — I200 Unstable angina: Secondary | ICD-10-CM

## 2019-12-19 MED ORDER — CANDESARTAN CILEXETIL 16 MG PO TABS: 16.0000 mg | ORAL_TABLET | Freq: Every morning | ORAL | 6 refills | Status: DC

## 2019-12-19 NOTE — Patient Instructions (Addendum)
Medication Instructions:   Your physician has recommended you make the following change in your medication:   Increase candesartain to 16 mg (whole tablet) in the morning and 8 mg (1/2 tablet) in the evening  Continue other medications the same  Labwork:  Your physician recommends that you return for non-fasting lab work in: 7-10 days to check your BMET. This may be done at [facility].  Testing/Procedures:  None  Follow-Up:  Your physician recommends that you schedule a follow-up appointment in: 6 months.   Any Other Special Instructions Will Be Listed Below (If Applicable).  If you need a refill on your cardiac medications before your next appointment, please call your pharmacy.

## 2019-12-19 NOTE — Progress Notes (Signed)
Cardiology Office Note  Date: 12/19/2019   ID: Crystal [name], DOB 1934/07/10, MRN [id number]  PCP:  Glenda Chroman, MD  Cardiologist:  Rozann Lesches, MD Electrophysiologist:  None   Chief Complaint  Patient presents with  . Cardiac follow-up    History of Present Illness: Crystal [name] is an 85 y.o. female last seen in April.  She is here today for a follow-up visit.  She reports stable fatigue with ADLs, has to pace herself.  No progressive breathlessness or chest pain however.  Follow-up echocardiogram in May revealed improvement in LVEF to the range of 45 to 50% as noted below.  She did not tolerate Crestor secondary to diarrhea which improved after discontinuation of the medication.  Had prior intolerance to Lipitor and Zetia and at this point not interested in any other medications for treatment of hyperlipidemia.  I reviewed her medications which are outlined below.  We discussed increasing the dose of her Atacand for better blood pressure control.  She reports systolics in the 850Y to 774J by home checks.  Past Medical History:  Diagnosis Date  . Bell's palsy   . Breast cancer ([place]) 1998   Right mastectomy  . CAD (coronary artery disease)    a. s/p STEMI in 01/2019 with DES to mid-LAD  . CHF (congestive heart failure) ([place])    a. EF 30-35% by echo in 01/2019  . Essential hypertension   . GERD (gastroesophageal reflux disease)   . Gout   . History of skin cancer    Squamous cell, left shoulder  . Mixed hyperlipidemia   . Osteopenia   . Reflux esophagitis   . Thyroid nodule   . Type 2 diabetes mellitus ([place])     Past Surgical History:  Procedure Laterality Date  . BIOPSY  10/13/2019   Procedure: BIOPSY;  Surgeon: Daneil Dolin, MD;  [facility];  Service: Endoscopy;;  . CATARACT EXTRACTION  2016  . CORONARY/GRAFT ACUTE MI REVASCULARIZATION N/A 01/30/2019   Procedure: Coronary/Graft Acute MI Revascularization;  Surgeon: Burnell Blanks, MD;  Location: [place] CV LAB;  Service: Cardiovascular;  Laterality: N/A;  . ESOPHAGOGASTRODUODENOSCOPY (EGD) WITH PROPOFOL N/A 10/13/2019   Procedure: ESOPHAGOGASTRODUODENOSCOPY (EGD) WITH PROPOFOL;  Surgeon: Daneil Dolin, MD;  [facility];  Service: Endoscopy;  Laterality: N/A;  2:30pm  . HYSTEROSCOPY    . LEFT HEART CATH AND CORONARY ANGIOGRAPHY N/A 01/30/2019   Procedure: LEFT HEART CATH AND CORONARY ANGIOGRAPHY;  Surgeon: Burnell Blanks, MD;  Location: [place] CV LAB;  Service: Cardiovascular;  Laterality: N/A;  . Venia Minks DILATION N/A 10/13/2019   Procedure: Venia Minks DILATION;  Surgeon: Daneil Dolin, MD;  [facility];  Service: Endoscopy;  Laterality: N/A;  . Right mastectomy  1998   [place]    Current Outpatient Medications  Medication Sig Dispense Refill  . acetaminophen (TYLENOL) 500 MG tablet Take 500 mg by mouth every 6 (six) hours as needed for headache (pain).    . Alogliptin Benzoate 12.5 MG TABS Take 12.5 mg by mouth daily at 2 PM.     . aspirin EC 81 MG tablet Take 81 mg by mouth every morning. (0930)    . Calcium Carbonate-Vitamin D (CALCIUM 500 + D PO) Take 1 tablet by mouth daily before lunch. Citracal plus D3    . candesartan (ATACAND) 16 MG tablet Take 1 tablet (16 mg total) by mouth in the morning. And 8 mg (1/2 tablet)  in the evening 45 tablet 6  . carvedilol (COREG) 25 MG tablet Take 1 tablet (25 mg total) by mouth in the morning and at bedtime. Breakfast & supper 180 tablet 3  . cholecalciferol (VITAMIN D3) 25 MCG (1000 UT) tablet Take 1,000 Units by mouth daily with breakfast.    . CINNAMON PO Take 1,000 mg by mouth daily before lunch.     . clopidogrel (PLAVIX) 75 MG tablet TAKE 1 TABLET BY MOUTH DAILY--STOP BRILLINTA. (Patient taking differently: Take 75 mg by mouth every morning. ) 90 tablet 1  . diazepam (VALIUM) 2 MG tablet Take 2 mg by mouth 2 (two) times daily as needed (dizzy spells.).    Marland Kitchen Doxepin HCl  3 MG TABS Take 3 mg by mouth at bedtime as needed (sleep.).     Marland Kitchen ferrous sulfate 325 (65 FE) MG tablet Take 325 mg by mouth daily before lunch.    . furosemide (LASIX) 20 MG tablet Take 1 tablet (20 mg total) by mouth daily. 90 tablet 3  . isosorbide mononitrate (IMDUR) 30 MG 24 hr tablet TAKE 1 TABLET EVERY EVENING. 90 tablet 2  . loperamide (IMODIUM) 2 MG capsule Take 2-4 mg by mouth 4 (four) times daily as needed for diarrhea or loose stools.    . meclizine (ANTIVERT) 25 MG tablet Take 25 mg by mouth 2 (two) times daily as needed for dizziness.    . Multiple Vitamin (MULTIVITAMIN WITH MINERALS) TABS tablet Take 1 tablet by mouth daily. Centrum Silver for Women    . multivitamin-lutein (OCUVITE-LUTEIN) CAPS capsule Take 1 capsule by mouth daily before lunch.     . nitroGLYCERIN (NITROSTAT) 0.4 MG SL tablet Place 1 tablet (0.4 mg total) under the tongue every 5 (five) minutes x 3 doses as needed for chest pain. 25 tablet 2  . Omega-3 Fatty Acids (FISH OIL PO) Take 1,576 mg by mouth daily before lunch.    Marland Kitchen saccharomyces boulardii (FLORASTOR) 250 MG capsule Take 250 mg by mouth 2 (two) times daily as needed (diarrhea).     . sodium chloride (OCEAN) 0.65 % SOLN nasal spray Place 1 spray into both nostrils 2 (two) times daily as needed for congestion.     Marland Kitchen spironolactone (ALDACTONE) 25 MG tablet TAKE (1/2) TABLET BY MOUTH DAILY. (Patient taking differently: Take 12.5 mg by mouth daily. ) 45 tablet 8   No current facility-administered medications for this visit.   Allergies:  Bactrim [sulfamethoxazole-trimethoprim], Sulfa antibiotics, Amlodipine, Clonidine derivatives, Evista [raloxifene hcl], Fosamax [alendronate sodium], Glipizide, Lipitor [atorvastatin calcium], Losartan, Pravastatin, and Azithromycin   ROS: No palpitations or syncope.  Physical Exam: VS:  BP (!) 170/78   Pulse 64   Ht 5\' 5"  (1.651 m)   Wt 137 lb (62.1 kg)   SpO2 98%   BMI 22.80 kg/m , BMI Body mass index is 22.8  kg/m.  Wt Readings from Last 3 Encounters:  12/19/19 137 lb (62.1 kg)  09/21/19 132 lb 12.8 oz (60.2 kg)  07/06/19 131 lb 3.2 oz (59.5 kg)    General: Elderly woman, appears comfortable at rest. HEENT: Conjunctiva and lids normal, wearing a mask. Neck: Supple, no elevated JVP or carotid bruits, no thyromegaly. Lungs: Clear to auscultation, nonlabored breathing at rest. Cardiac: Regular rate and rhythm, no S3 or significant systolic murmur. Extremities: No pitting edema.  ECG:  An ECG dated 02/27/2019 was personally reviewed today and demonstrated:  Sinus tachycardia with nonspecific ST-T changes, possible lateral ischemia.  Recent Labwork: 02/27/2019: B Natriuretic  Peptide 2,582.0 03/02/2019: Magnesium 1.8 04/13/2019: ALT 17; AST 21 10/12/2019: BUN 35; Creatinine, Ser 1.34; Potassium 4.6; Sodium 129 10/26/2019: Hemoglobin 11.2; Platelets 315     Component Value Date/Time   CHOL 155 04/13/2019 1118   TRIG 111 04/13/2019 1118   HDL 56 04/13/2019 1118   CHOLHDL 2.8 04/13/2019 1118   VLDL 22 04/13/2019 1118   LDLCALC 77 04/13/2019 1118    Other Studies Reviewed Today:  Cardiac catheterization 01/30/2019:  Prox RCA lesion is 60% stenosed.  Mid RCA lesion is 40% stenosed.  Prox Cx to Mid Cx lesion is 20% stenosed.  Mid LAD lesion is 99% stenosed.  A drug-eluting stent was successfully placed using a SYNERGY XD 3.0X28.  Post intervention, there is a 0% residual stenosis.  1. Severe stenosis mid LAD 2. Successful PTCA/DES x 1 mid LAD 3. Mild non-obstructive disease in the Circumflex 4. Moderate non-obstructive disease in the mid RCA  Recommendations: Will admit to the ICU. DAPT with ASA and Brilinta for one year. Continue home beta blocker. Echo in am. She is statin intolerant. Fast track discharge, possibly home tomorrow if stable.  Echocardiogram 06/13/2019: 1. Left ventricular ejection fraction, by estimation, is 45 to 50%. The  left ventricle has mildly decreased  function. The left ventricle  demonstrates regional wall motion abnormalities (see scoring  diagram/findings for description). Left ventricular  diastolic parameters are consistent with Grade I diastolic dysfunction  (impaired relaxation). Elevated left ventricular end-diastolic pressure.  2. Right ventricular systolic function is mildly reduced. The right  ventricular size is normal. There is normal pulmonary artery systolic  pressure.  3. Left atrial size was mildly dilated.  4. The mitral valve is degenerative. Mild mitral valve regurgitation.  5. Tricuspid valve regurgitation is mild to moderate.  6. The aortic valve was not well visualized. Aortic valve regurgitation  is mild. No aortic stenosis is present.  7. The inferior vena cava is normal in size with greater than 50%  respiratory variability, suggesting right atrial pressure of 3 mmHg.   Comparison(s): Echocardiogram done 01/31/19 showed an EF of 30-35%.   Assessment and Plan:  1.  Ischemic cardiomyopathy, LVEF improved to the range of 45 to 50% by last echocardiogram in May.  Continue observation on medical therapy including Atacand, Coreg, Lasix, and Aldactone.  2.  CAD status post STEMI in January with DES to the mid LAD.  No active angina at this time.  Continue aspirin and Plavix.  She has a history of statin intolerance as discussed above and does not want to consider PCSK9 inhibitor.  3.  Essential hypertension, increase Atacand to 60 mg the morning and 8 mg in the evening.  Check BMET in 7 to 10 days.  4.  CKD stage IIIb, last creatinine 1.34.  Medication Adjustments/Labs and Tests Ordered: Current medicines are reviewed at length with the patient today.  Concerns regarding medicines are outlined above.   Tests Ordered: Orders Placed This Encounter  Procedures  . Basic metabolic panel    Medication Changes: Meds ordered this encounter  Medications  . candesartan (ATACAND) 16 MG tablet    Sig: Take 1  tablet (16 mg total) by mouth in the morning. And 8 mg (1/2 tablet) in the evening    Dispense:  45 tablet    Refill:  6    12/19/2019 dose increase    Disposition:  Follow up 6 months in the [place] office.  Signed, Satira Sark, MD, [facility] 12/19/2019 3:47 PM  [facility] [place], [place], [place] [address] Phone: [phone]; Fax: [phone]

## 2019-12-21 ENCOUNTER — Ambulatory Visit: Payer: Medicare Other | Admitting: Cardiology

## 2019-12-26 DIAGNOSIS — I25119 Atherosclerotic heart disease of native coronary artery with unspecified angina pectoris: Secondary | ICD-10-CM | POA: Diagnosis not present

## 2019-12-27 DIAGNOSIS — E119 Type 2 diabetes mellitus without complications: Secondary | ICD-10-CM | POA: Diagnosis not present

## 2019-12-27 DIAGNOSIS — I10 Essential (primary) hypertension: Secondary | ICD-10-CM | POA: Diagnosis not present

## 2020-01-03 ENCOUNTER — Telehealth: Payer: Self-pay | Admitting: *Deleted

## 2020-01-03 ENCOUNTER — Other Ambulatory Visit: Payer: Self-pay | Admitting: *Deleted

## 2020-01-03 DIAGNOSIS — N1832 Chronic kidney disease, stage 3b: Secondary | ICD-10-CM

## 2020-01-03 DIAGNOSIS — I5022 Chronic systolic (congestive) heart failure: Secondary | ICD-10-CM

## 2020-01-03 DIAGNOSIS — I10 Essential (primary) hypertension: Secondary | ICD-10-CM

## 2020-01-03 NOTE — Telephone Encounter (Signed)
-----   Message from Crystal Sark, MD sent at 01/03/2020 12:07 PM EST ----- Results reviewed.  Atacand recently increased to 16 mg in the morning and 8 mg in the evening.  She has known CKD stage IIIb.  Creatinine 1.5 with normal potassium.  Would continue with current plan.  Recheck BMET for next visit.

## 2020-01-03 NOTE — Telephone Encounter (Signed)
Patient informed and verbalized understanding of plan.  Order mailed to patient Copy sent to PCP

## 2020-01-09 ENCOUNTER — Other Ambulatory Visit: Payer: Self-pay

## 2020-01-09 DIAGNOSIS — D509 Iron deficiency anemia, unspecified: Secondary | ICD-10-CM

## 2020-01-12 ENCOUNTER — Other Ambulatory Visit: Payer: Self-pay

## 2020-01-12 ENCOUNTER — Ambulatory Visit (INDEPENDENT_AMBULATORY_CARE_PROVIDER_SITE_OTHER): Payer: Medicare Other | Admitting: Gastroenterology

## 2020-01-12 ENCOUNTER — Encounter: Payer: Self-pay | Admitting: Gastroenterology

## 2020-01-12 VITALS — BP 187/70 | HR 64 | Temp 97.1°F | Ht 65.0 in | Wt 135.0 lb

## 2020-01-12 DIAGNOSIS — R1310 Dysphagia, unspecified: Secondary | ICD-10-CM

## 2020-01-12 DIAGNOSIS — I200 Unstable angina: Secondary | ICD-10-CM

## 2020-01-12 DIAGNOSIS — D649 Anemia, unspecified: Secondary | ICD-10-CM

## 2020-01-12 MED ORDER — PANTOPRAZOLE SODIUM 40 MG PO TBEC: 40.0000 mg | DELAYED_RELEASE_TABLET | Freq: Every day | ORAL | 3 refills | Status: DC

## 2020-01-12 NOTE — Patient Instructions (Signed)
I sent in Protonix to take once each morning, 30 minutes before breakfast. Make sure to take this to help protect your stomach.   Please complete blood work in January at [facility].  We have ordered a swallow study that will check to see what's going on with your esophagus.   Some probiotic examples are Digestive Advantage, [employer], Align, Restora.  We will see you in 6 months regardless!   Have a wonderful Christmas!  I enjoyed seeing you again today! As you know, I value our relationship and want to provide genuine, compassionate, and quality care. I welcome your feedback. If you receive a survey regarding your visit,  I greatly appreciate you taking time to fill this out. See you next time!  Annitta Needs, PhD, ANP-BC [facility]

## 2020-01-12 NOTE — Progress Notes (Signed)
Referring Provider: Glenda Chroman, MD Primary Care Physician:  Glenda Chroman, MD Primary GI: Dr. Gala Romney    Chief Complaint  Patient presents with  . Follow-up    Swallowing is about the same as before. Diarrhea is better     HPI:   Crystal [name] is an 85 y.o. female presenting today with a history of diarrhea in Feb 2021 that has now much improved, with improvement in bowel habits taking fiber daily. Mild normocytic anemia noted in setting of CKD, with possible IDA component as ferritin low normal, dysphagia s/p recent EGD with non-obstructing Schatzki ring at GE junction, s/p dilation, erosive gastropathy with stigmata of recent bleeding, normal duodenum. Negative H.pylori.   She is on oral iron now, with last Hgb 11.2 in Sept 2021. Ferritin 64. This will be rechecked soon. Still with dysphagia. Pills and occasionally solids. Diarrhea is better. Will still have spells but usually just once per week. Will eat fiber cookies one a day. Interested in probiotics she doesn't have to refrigerate.   Past Medical History:  Diagnosis Date  . Bell's palsy   . Breast cancer ([place]) 1998   Right mastectomy  . CAD (coronary artery disease)    a. s/p STEMI in 01/2019 with DES to mid-LAD  . CHF (congestive heart failure) ([place])    a. EF 30-35% by echo in 01/2019  . Essential hypertension   . GERD (gastroesophageal reflux disease)   . Gout   . History of skin cancer    Squamous cell, left shoulder  . Mixed hyperlipidemia   . Osteopenia   . Reflux esophagitis   . Thyroid nodule   . Type 2 diabetes mellitus ([place])     Past Surgical History:  Procedure Laterality Date  . BIOPSY  10/13/2019   Procedure: BIOPSY;  Surgeon: Daneil Dolin, MD;  [facility];  Service: Endoscopy;;  . CATARACT EXTRACTION  2016  . CORONARY/GRAFT ACUTE MI REVASCULARIZATION N/A 01/30/2019   Procedure: Coronary/Graft Acute MI Revascularization;  Surgeon: Burnell Blanks, MD;  Location: [facility];  Service: Cardiovascular;  Laterality: N/A;  . ESOPHAGOGASTRODUODENOSCOPY (EGD) WITH PROPOFOL N/A 10/13/2019   Non-obstructing Schatzki ring at GE junction, s/p dilation, erosive gastropathy with stigmata of recent bleeding, normal duodenum. Negative H.pylori.   Marland Kitchen HYSTEROSCOPY    . LEFT HEART CATH AND CORONARY ANGIOGRAPHY N/A 01/30/2019   Procedure: LEFT HEART CATH AND CORONARY ANGIOGRAPHY;  Surgeon: Burnell Blanks, MD;  Location: [place] [facility];  Service: Cardiovascular;  Laterality: N/A;  . Venia Minks DILATION N/A 10/13/2019   Procedure: Venia Minks DILATION;  Surgeon: Daneil Dolin, MD;  Location: [facility];  Service: Endoscopy;  Laterality: N/A;  . Right mastectomy  1998   [place]    Current Outpatient Medications  Medication Sig Dispense Refill  . acetaminophen (TYLENOL) 500 MG tablet Take 500 mg by mouth every 6 (six) hours as needed for headache (pain).    . Alogliptin Benzoate 12.5 MG TABS Take 12.5 mg by mouth daily at 2 PM.     . aspirin EC 81 MG tablet Take 81 mg by mouth every morning. (0930)    . Calcium Carbonate-Vitamin D (CALCIUM 500 + D PO) Take 1 tablet by mouth daily before lunch. Citracal plus D3    . candesartan (ATACAND) 16 MG tablet Take 1 tablet (16 mg total) by mouth in the morning. And 8 mg (1/2 tablet) in the evening 45 tablet  6  . carvedilol (COREG) 25 MG tablet Take 1 tablet (25 mg total) by mouth in the morning and at bedtime. Breakfast & supper 180 tablet 3  . cholecalciferol (VITAMIN D3) 25 MCG (1000 UT) tablet Take 1,000 Units by mouth daily with breakfast.    . CINNAMON PO Take 1,000 mg by mouth daily before lunch.     . clopidogrel (PLAVIX) 75 MG tablet TAKE 1 TABLET BY MOUTH DAILY--STOP BRILLINTA. (Patient taking differently: Take 75 mg by mouth every morning.) 90 tablet 1  . diazepam (VALIUM) 2 MG tablet Take 2 mg by mouth 2 (two) times daily as needed (dizzy spells.).    Marland Kitchen Doxepin HCl 3 MG TABS Take 3 mg by mouth at bedtime as  needed (sleep.).     Marland Kitchen ferrous sulfate 325 (65 FE) MG tablet Take 325 mg by mouth daily before lunch.    . furosemide (LASIX) 20 MG tablet Take 1 tablet (20 mg total) by mouth daily. 90 tablet 3  . isosorbide mononitrate (IMDUR) 30 MG 24 hr tablet TAKE 1 TABLET EVERY EVENING. 90 tablet 2  . loperamide (IMODIUM) 2 MG capsule Take 2-4 mg by mouth 4 (four) times daily as needed for diarrhea or loose stools.    . meclizine (ANTIVERT) 25 MG tablet Take 25 mg by mouth 2 (two) times daily as needed for dizziness.    . Multiple Vitamin (MULTIVITAMIN WITH MINERALS) TABS tablet Take 1 tablet by mouth daily. Centrum Silver for Women    . multivitamin-lutein (OCUVITE-LUTEIN) CAPS capsule Take 1 capsule by mouth daily before lunch.     . nitroGLYCERIN (NITROSTAT) 0.4 MG SL tablet Place 1 tablet (0.4 mg total) under the tongue every 5 (five) minutes x 3 doses as needed for chest pain. 25 tablet 2  . Omega-3 Fatty Acids (FISH OIL PO) Take 1,576 mg by mouth daily before lunch.    Marland Kitchen saccharomyces boulardii (FLORASTOR) 250 MG capsule Take 250 mg by mouth 2 (two) times daily as needed (diarrhea).     . sodium chloride (OCEAN) 0.65 % SOLN nasal spray Place 1 spray into both nostrils 2 (two) times daily as needed for congestion.     Marland Kitchen spironolactone (ALDACTONE) 25 MG tablet TAKE (1/2) TABLET BY MOUTH DAILY. (Patient taking differently: Take 12.5 mg by mouth daily.) 45 tablet 8   No current facility-administered medications for this visit.    Allergies as of 01/12/2020 - Review Complete 01/12/2020  Allergen Reaction Noted  . Bactrim [sulfamethoxazole-trimethoprim] Nausea And Vomiting 06/02/2017  . Sulfa antibiotics Nausea And Vomiting 06/02/2017  . Amlodipine Other (See Comments) 06/02/2017  . Clonidine derivatives Other (See Comments) 06/02/2017  . Evista [raloxifene hcl] Other (See Comments) 06/02/2017  . Fosamax [alendronate sodium] Other (See Comments) 06/02/2017  . Glipizide Other (See Comments) 06/02/2017   . Lipitor [atorvastatin calcium] Other (See Comments) 06/02/2017  . Losartan Other (See Comments) 06/02/2017  . Pravastatin Other (See Comments) 06/02/2017  . Azithromycin Rash and Other (See Comments) 06/02/2017    Family History  Problem Relation Age of Onset  . Stroke Mother   . Heart attack Father   . Aneurysm Sister   . Heart attack Maternal Uncle   . Heart disease Maternal Uncle   . Heart attack Paternal Aunt   . Heart disease Paternal Aunt   . Heart attack Paternal Grandfather   . Heart disease Paternal Grandfather   . Heart attack Paternal Aunt   . Heart disease Paternal Aunt   . Heart attack Maternal  Uncle   . Heart disease Maternal Uncle   . Heart attack Maternal Uncle   . Heart disease Maternal Uncle   . Heart attack Sister   . Heart disease Sister   . Cancer Sister   . Colon cancer Neg Hx   . Colon polyps Neg Hx     Social History   Socioeconomic History  . Marital status: Divorced    Spouse name: Not on file  . Number of children: Not on file  . Years of education: Not on file  . Highest education level: Not on file  Occupational History  . Not on file  Tobacco Use  . Smoking status: Never Smoker  . Smokeless tobacco: Never Used  Vaping Use  . Vaping Use: Never used  Substance and Sexual Activity  . Alcohol use: Never  . Drug use: Never  . Sexual activity: Not on file  Other Topics Concern  . Not on file  Social History Narrative  . Not on file   Social Determinants of Health   Financial Resource Strain: Not on file  Food Insecurity: Not on file  Transportation [name]: Not on file  Physical Activity: Not on file  Stress: Not on file  Social Connections: Not on file    Review of Systems: Gen: Denies fever, chills, anorexia. Denies fatigue, weakness, weight loss.  CV: Denies chest pain, palpitations, syncope, peripheral edema, and claudication. Resp: Denies dyspnea at rest, cough, wheezing, coughing up blood, and pleurisy. GI: see  HPI Derm: Denies rash, itching, dry skin Psych: Denies depression, anxiety, memory loss, confusion. No homicidal or suicidal ideation.  Heme: Denies bruising, bleeding, and enlarged lymph nodes.  Physical Exam: BP (!) 187/70   Pulse 64   Temp (!) 97.1 F (36.2 C) (Temporal)   Ht 5\' 5"  (1.651 m)   Wt 135 lb (61.2 kg)   BMI 22.47 kg/m  General:   Alert and oriented. No distress noted. Pleasant and cooperative.  Head:  Normocephalic and atraumatic. Eyes:  Conjuctiva clear without scleral icterus. Mouth:  Mask in place Abdomen:  +BS, soft, non-tender and non-distended. No rebound or guarding. No HSM or masses noted. Msk:  Symmetrical without gross deformities. Normal posture. Extremities:  Without edema. Neurologic:  Alert and  oriented x4 Psych:  Alert and cooperative. Normal mood and affect.  ASSESSMENT: MELLIE BUCCELLATO is an 85 y.o. female presenting today with history of diarrhea now much improved on supplemental fiber, dysphagia s/p recent EGD with dilation, erosive gastropathy on EGD, normocytic anemia in setting of CKD with possible IDA component.   Dysphagia is not improved s/p dilation. Query underlying motility disorder. Will pursue BPE.  Anemia: update CBC, iron studies in Jan 2022. Start Protonix once daily in setting of daily aspirin and known erosive gastropathy on recent EGD.    PLAN:  Protonix daily  BPE ordered  CBC, iron studies in Jan 2021  Return in 6 months  Crystal Needs, PhD, [facility]

## 2020-01-16 ENCOUNTER — Other Ambulatory Visit: Payer: Self-pay | Admitting: *Deleted

## 2020-01-16 ENCOUNTER — Encounter (HOSPITAL_COMMUNITY)
Admission: EM | Disposition: A | Discharge status: Home / Self Care | Payer: Self-pay | Source: Home / Self Care | Attending: Cardiovascular Disease

## 2020-01-16 ENCOUNTER — Encounter (HOSPITAL_COMMUNITY): Payer: Self-pay

## 2020-01-16 ENCOUNTER — Inpatient Hospital Stay (HOSPITAL_COMMUNITY): Payer: Medicare Other

## 2020-01-16 ENCOUNTER — Other Ambulatory Visit: Payer: Self-pay

## 2020-01-16 ENCOUNTER — Emergency Department (HOSPITAL_COMMUNITY): Payer: Medicare Other

## 2020-01-16 ENCOUNTER — Inpatient Hospital Stay (HOSPITAL_COMMUNITY)
Admission: EM | Admit: 2020-01-16 | Discharge: 2020-01-19 | DRG: 247 | Disposition: A | Discharge status: Home / Self Care | Payer: Medicare Other | Attending: Cardiovascular Disease | Admitting: Cardiovascular Disease

## 2020-01-16 DIAGNOSIS — E785 Hyperlipidemia, unspecified: Secondary | ICD-10-CM | POA: Diagnosis present

## 2020-01-16 DIAGNOSIS — I5022 Chronic systolic (congestive) heart failure: Secondary | ICD-10-CM | POA: Diagnosis not present

## 2020-01-16 DIAGNOSIS — Z882 Allergy status to sulfonamides status: Secondary | ICD-10-CM | POA: Diagnosis not present

## 2020-01-16 DIAGNOSIS — R079 Chest pain, unspecified: Secondary | ICD-10-CM

## 2020-01-16 DIAGNOSIS — I10 Essential (primary) hypertension: Secondary | ICD-10-CM | POA: Diagnosis not present

## 2020-01-16 DIAGNOSIS — Z888 Allergy status to other drugs, medicaments and biological substances status: Secondary | ICD-10-CM

## 2020-01-16 DIAGNOSIS — R9431 Abnormal electrocardiogram [ECG] [EKG]: Secondary | ICD-10-CM | POA: Diagnosis not present

## 2020-01-16 DIAGNOSIS — R457 State of emotional shock and stress, unspecified: Secondary | ICD-10-CM | POA: Diagnosis not present

## 2020-01-16 DIAGNOSIS — Z85828 Personal history of other malignant neoplasm of skin: Secondary | ICD-10-CM

## 2020-01-16 DIAGNOSIS — Z20822 Contact with and (suspected) exposure to covid-19: Secondary | ICD-10-CM | POA: Diagnosis present

## 2020-01-16 DIAGNOSIS — E871 Hypo-osmolality and hyponatremia: Secondary | ICD-10-CM | POA: Diagnosis not present

## 2020-01-16 DIAGNOSIS — I130 Hypertensive heart and chronic kidney disease with heart failure and stage 1 through stage 4 chronic kidney disease, or unspecified chronic kidney disease: Secondary | ICD-10-CM | POA: Diagnosis not present

## 2020-01-16 DIAGNOSIS — Z7902 Long term (current) use of antithrombotics/antiplatelets: Secondary | ICD-10-CM

## 2020-01-16 DIAGNOSIS — N1832 Chronic kidney disease, stage 3b: Secondary | ICD-10-CM | POA: Diagnosis not present

## 2020-01-16 DIAGNOSIS — I2510 Atherosclerotic heart disease of native coronary artery without angina pectoris: Secondary | ICD-10-CM | POA: Diagnosis present

## 2020-01-16 DIAGNOSIS — I214 Non-ST elevation (NSTEMI) myocardial infarction: Principal | ICD-10-CM | POA: Diagnosis present

## 2020-01-16 DIAGNOSIS — E782 Mixed hyperlipidemia: Secondary | ICD-10-CM | POA: Diagnosis present

## 2020-01-16 DIAGNOSIS — M109 Gout, unspecified: Secondary | ICD-10-CM | POA: Diagnosis not present

## 2020-01-16 DIAGNOSIS — Z881 Allergy status to other antibiotic agents status: Secondary | ICD-10-CM | POA: Diagnosis not present

## 2020-01-16 DIAGNOSIS — Z8249 Family history of ischemic heart disease and other diseases of the circulatory system: Secondary | ICD-10-CM | POA: Diagnosis not present

## 2020-01-16 DIAGNOSIS — E1122 Type 2 diabetes mellitus with diabetic chronic kidney disease: Secondary | ICD-10-CM | POA: Diagnosis present

## 2020-01-16 DIAGNOSIS — Z79899 Other long term (current) drug therapy: Secondary | ICD-10-CM

## 2020-01-16 DIAGNOSIS — Z955 Presence of coronary angioplasty implant and graft: Secondary | ICD-10-CM | POA: Diagnosis not present

## 2020-01-16 DIAGNOSIS — Z7982 Long term (current) use of aspirin: Secondary | ICD-10-CM | POA: Diagnosis not present

## 2020-01-16 DIAGNOSIS — N179 Acute kidney failure, unspecified: Secondary | ICD-10-CM | POA: Diagnosis not present

## 2020-01-16 DIAGNOSIS — Z9011 Acquired absence of right breast and nipple: Secondary | ICD-10-CM | POA: Diagnosis not present

## 2020-01-16 DIAGNOSIS — I340 Nonrheumatic mitral (valve) insufficiency: Secondary | ICD-10-CM

## 2020-01-16 DIAGNOSIS — I2582 Chronic total occlusion of coronary artery: Secondary | ICD-10-CM | POA: Diagnosis not present

## 2020-01-16 DIAGNOSIS — I351 Nonrheumatic aortic (valve) insufficiency: Secondary | ICD-10-CM | POA: Diagnosis not present

## 2020-01-16 DIAGNOSIS — M8580 Other specified disorders of bone density and structure, unspecified site: Secondary | ICD-10-CM | POA: Diagnosis not present

## 2020-01-16 DIAGNOSIS — R0789 Other chest pain: Secondary | ICD-10-CM | POA: Diagnosis not present

## 2020-01-16 DIAGNOSIS — I252 Old myocardial infarction: Secondary | ICD-10-CM

## 2020-01-16 DIAGNOSIS — Z853 Personal history of malignant neoplasm of breast: Secondary | ICD-10-CM | POA: Diagnosis not present

## 2020-01-16 DIAGNOSIS — E119 Type 2 diabetes mellitus without complications: Secondary | ICD-10-CM

## 2020-01-16 HISTORY — PX: CORONARY ANGIOGRAPHY: CATH118303

## 2020-01-16 HISTORY — PX: LEFT HEART CATH AND CORONARY ANGIOGRAPHY: CATH118249

## 2020-01-16 HISTORY — PX: CORONARY ULTRASOUND/IVUS: CATH118244

## 2020-01-16 HISTORY — PX: CORONARY STENT INTERVENTION: CATH118234

## 2020-01-16 LAB — MAGNESIUM: Magnesium: 1.2 mg/dL — ABNORMAL LOW (ref 1.7–2.4)

## 2020-01-16 LAB — COMPREHENSIVE METABOLIC PANEL
ALT: 14 U/L (ref 0–44)
AST: 24 U/L (ref 15–41)
Albumin: 4.1 g/dL (ref 3.5–5.0)
Alkaline Phosphatase: 45 U/L (ref 38–126)
Anion gap: 8 (ref 5–15)
BUN: 38 mg/dL — ABNORMAL HIGH (ref 8–23)
CO2: 23 mmol/L (ref 22–32)
Calcium: 9.2 mg/dL (ref 8.9–10.3)
Chloride: 95 mmol/L — ABNORMAL LOW (ref 98–111)
Creatinine, Ser: 1.66 mg/dL — ABNORMAL HIGH (ref 0.44–1.00)
GFR, Estimated: 30 mL/min — ABNORMAL LOW (ref >60)
Glucose, Bld: 108 mg/dL — ABNORMAL HIGH (ref 70–99)
Potassium: 4.4 mmol/L (ref 3.5–5.1)
Sodium: 126 mmol/L — ABNORMAL LOW (ref 135–145)
Total Bilirubin: 0.5 mg/dL (ref 0.3–1.2)
Total Protein: 6.9 g/dL (ref 6.5–8.1)

## 2020-01-16 LAB — ECHOCARDIOGRAM COMPLETE
AR max vel: 1.46 cm2
AV Area VTI: 1.44 cm2
AV Area mean vel: 1.21 cm2
AV Mean grad: 4 mmHg
AV Peak grad: 7 mmHg
Ao pk vel: 1.32 m/s
Area-P 1/2: 4.1 cm2
Height: 65 in
P 1/2 time: 356 msec
S' Lateral: 3.04 cm
Weight: 2080 oz

## 2020-01-16 LAB — HEMOGLOBIN A1C
Hgb A1c MFr Bld: 6 % — ABNORMAL HIGH (ref 4.8–5.6)
Mean Plasma Glucose: 125.5 mg/dL

## 2020-01-16 LAB — TROPONIN I (HIGH SENSITIVITY)
Troponin I (High Sensitivity): 842 ng/L (ref <18)
Troponin I (High Sensitivity): 741 ng/L (ref <18)
Troponin I (High Sensitivity): 675 ng/L (ref <18)
Troponin I (High Sensitivity): 614 ng/L (ref <18)

## 2020-01-16 LAB — CBC WITH DIFFERENTIAL/PLATELET
Abs Immature Granulocytes: 0.04 10*3/uL (ref 0.00–0.07)
Basophils Absolute: 0.1 10*3/uL (ref 0.0–0.1)
Basophils Relative: 1 %
Eosinophils Absolute: 0.3 10*3/uL (ref 0.0–0.5)
Eosinophils Relative: 3 %
HCT: 33 % — ABNORMAL LOW (ref 36.0–46.0)
Hemoglobin: 11.2 g/dL — ABNORMAL LOW (ref 12.0–15.0)
Immature Granulocytes: 0 %
Lymphocytes Relative: 28 %
Lymphs Abs: 2.6 10*3/uL (ref 0.7–4.0)
MCH: 28.4 pg (ref 26.0–34.0)
MCHC: 33.9 g/dL (ref 30.0–36.0)
MCV: 83.5 fL (ref 80.0–100.0)
Monocytes Absolute: 1.1 10*3/uL — ABNORMAL HIGH (ref 0.1–1.0)
Monocytes Relative: 12 %
Neutro Abs: 5 10*3/uL (ref 1.7–7.7)
Neutrophils Relative %: 56 %
Platelets: 318 10*3/uL (ref 150–400)
RBC: 3.95 MIL/uL (ref 3.87–5.11)
RDW: 13.9 % (ref 11.5–15.5)
WBC: 9 10*3/uL (ref 4.0–10.5)
nRBC: 0 % (ref 0.0–0.2)

## 2020-01-16 LAB — GLUCOSE, CAPILLARY: Glucose-Capillary: 157 mg/dL — ABNORMAL HIGH (ref 70–99)

## 2020-01-16 LAB — POCT ACTIVATED CLOTTING TIME: Activated Clotting Time: 297 seconds

## 2020-01-16 LAB — APTT: aPTT: 130 seconds — ABNORMAL HIGH (ref 24–36)

## 2020-01-16 LAB — PROTIME-INR
INR: 1.1 (ref 0.8–1.2)
Prothrombin Time: 13.8 seconds (ref 11.4–15.2)

## 2020-01-16 LAB — RESP PANEL BY RT-PCR (FLU A&B, COVID) ARPGX2
Influenza A by PCR: NEGATIVE
Influenza B by PCR: NEGATIVE
SARS Coronavirus 2 by RT PCR: NEGATIVE

## 2020-01-16 LAB — HEPARIN LEVEL (UNFRACTIONATED)
Heparin Unfractionated: 0.58 IU/mL (ref 0.30–0.70)
Heparin Unfractionated: 0.29 IU/mL — ABNORMAL LOW (ref 0.30–0.70)

## 2020-01-16 LAB — TSH: TSH: 1.032 u[IU]/mL (ref 0.350–4.500)

## 2020-01-16 LAB — T4, FREE: Free T4: 1.59 ng/dL — ABNORMAL HIGH (ref 0.61–1.12)

## 2020-01-16 SURGERY — LEFT HEART CATH AND CORONARY ANGIOGRAPHY
Anesthesia: LOCAL

## 2020-01-16 SURGERY — CORONARY ANGIOGRAPHY (CATH LAB)
Anesthesia: LOCAL

## 2020-01-16 MED ORDER — LABETALOL HCL 5 MG/ML IV SOLN: 10.0000 mg | INTRAVENOUS | Status: DC | PRN

## 2020-01-16 MED ORDER — CLOPIDOGREL BISULFATE 75 MG PO TABS: 75.0000 mg | ORAL_TABLET | Freq: Every day | ORAL | Status: DC

## 2020-01-16 MED ORDER — HYDRALAZINE HCL 20 MG/ML IJ SOLN: 10.0000 mg | INTRAMUSCULAR | Status: AC | PRN

## 2020-01-16 MED ORDER — CLOPIDOGREL BISULFATE 75 MG PO TABS
ORAL_TABLET | ORAL | Status: AC
  Filled 2020-01-16: qty 1

## 2020-01-16 MED ORDER — ASPIRIN EC 81 MG PO TBEC: 81.0000 mg | DELAYED_RELEASE_TABLET | Freq: Every day | ORAL | Status: DC

## 2020-01-16 MED ORDER — ALOGLIPTIN BENZOATE 12.5 MG PO TABS: 12.5000 mg | ORAL_TABLET | Freq: Every day | ORAL | Status: DC

## 2020-01-16 MED ORDER — SODIUM CHLORIDE 0.9% FLUSH: 3.0000 mL | INTRAVENOUS | Status: DC | PRN

## 2020-01-16 MED ORDER — HEPARIN (PORCINE) IN NACL 1000-0.9 UT/500ML-% IV SOLN
INTRAVENOUS | Status: DC | PRN
  Administered 2020-01-16 (×2): 500 mL

## 2020-01-16 MED ORDER — HEPARIN (PORCINE) 25000 UT/250ML-% IV SOLN
850.0000 [IU]/h | INTRAVENOUS | Status: DC
  Administered 2020-01-16: 700 [IU]/h via INTRAVENOUS
  Filled 2020-01-16: qty 250

## 2020-01-16 MED ORDER — NITROGLYCERIN 0.4 MG SL SUBL: 0.4000 mg | SUBLINGUAL_TABLET | SUBLINGUAL | Status: DC | PRN

## 2020-01-16 MED ORDER — ACETAMINOPHEN 500 MG PO TABS: 500.0000 mg | ORAL_TABLET | Freq: Four times a day (QID) | ORAL | Status: DC | PRN

## 2020-01-16 MED ORDER — SODIUM CHLORIDE 0.9 % IV SOLN
INTRAVENOUS | Status: DC
Start: 2020-01-16 — End: 2020-01-19

## 2020-01-16 MED ORDER — ONDANSETRON HCL 4 MG/2ML IJ SOLN: 4.0000 mg | Freq: Four times a day (QID) | INTRAMUSCULAR | Status: DC | PRN

## 2020-01-16 MED ORDER — SODIUM CHLORIDE 0.9 % IV SOLN: 250.0000 mL | INTRAVENOUS | Status: DC | PRN

## 2020-01-16 MED ORDER — CLOPIDOGREL BISULFATE 300 MG PO TABS
ORAL_TABLET | ORAL | Status: DC | PRN
  Administered 2020-01-16: 300 mg via ORAL

## 2020-01-16 MED ORDER — VERAPAMIL HCL 2.5 MG/ML IV SOLN
INTRAVENOUS | Status: DC | PRN
  Administered 2020-01-16 (×3): 200 ug via INTRACORONARY

## 2020-01-16 MED ORDER — SODIUM CHLORIDE 0.9 % IV SOLN: INTRAVENOUS | Status: DC

## 2020-01-16 MED ORDER — MORPHINE SULFATE (PF) 4 MG/ML IV SOLN
INTRAVENOUS | Status: AC
  Filled 2020-01-16: qty 1

## 2020-01-16 MED ORDER — NITROGLYCERIN 1 MG/10 ML FOR IR/CATH LAB
INTRA_ARTERIAL | Status: DC | PRN
  Administered 2020-01-16 (×2): 200 ug via INTRACORONARY

## 2020-01-16 MED ORDER — IOHEXOL 350 MG/ML SOLN
INTRAVENOUS | Status: DC | PRN
  Administered 2020-01-16: 75 mL

## 2020-01-16 MED ORDER — ADULT MULTIVITAMIN W/MINERALS CH
1.0000 | ORAL_TABLET | Freq: Every day | ORAL | Status: DC
  Administered 2020-01-17 – 2020-01-19 (×3): 1 via ORAL
  Filled 2020-01-16 (×3): qty 1

## 2020-01-16 MED ORDER — SODIUM CHLORIDE 0.9 % IV SOLN: INTRAVENOUS | Status: AC

## 2020-01-16 MED ORDER — CARVEDILOL 25 MG PO TABS
25.0000 mg | ORAL_TABLET | Freq: Two times a day (BID) | ORAL | Status: DC
  Administered 2020-01-16 – 2020-01-19 (×6): 25 mg via ORAL
  Filled 2020-01-16 (×6): qty 1

## 2020-01-16 MED ORDER — VERAPAMIL HCL 2.5 MG/ML IV SOLN
INTRAVENOUS | Status: DC | PRN
  Administered 2020-01-16: 10 mL via INTRA_ARTERIAL

## 2020-01-16 MED ORDER — ASPIRIN 81 MG PO CHEW: 81.0000 mg | CHEWABLE_TABLET | ORAL | Status: DC

## 2020-01-16 MED ORDER — CLOPIDOGREL BISULFATE 75 MG PO TABS
75.0000 mg | ORAL_TABLET | Freq: Every morning | ORAL | Status: DC
  Administered 2020-01-17 – 2020-01-19 (×3): 75 mg via ORAL
  Filled 2020-01-16 (×3): qty 1

## 2020-01-16 MED ORDER — MIDAZOLAM HCL 2 MG/2ML IJ SOLN
INTRAMUSCULAR | Status: AC
  Filled 2020-01-16: qty 2

## 2020-01-16 MED ORDER — LIDOCAINE HCL (PF) 1 % IJ SOLN
INTRAMUSCULAR | Status: AC
  Filled 2020-01-16: qty 30

## 2020-01-16 MED ORDER — ASPIRIN 300 MG RE SUPP
300.0000 mg | RECTAL | Status: AC
Start: 2020-01-16 — End: 2020-01-16
  Filled 2020-01-16: qty 1

## 2020-01-16 MED ORDER — ASPIRIN 81 MG PO CHEW
81.0000 mg | CHEWABLE_TABLET | ORAL | Status: AC
  Administered 2020-01-16: 81 mg via ORAL
  Filled 2020-01-16: qty 1

## 2020-01-16 MED ORDER — HEPARIN (PORCINE) IN NACL 1000-0.9 UT/500ML-% IV SOLN
INTRAVENOUS | Status: AC
  Filled 2020-01-16: qty 1000

## 2020-01-16 MED ORDER — ACETAMINOPHEN 325 MG PO TABS: 650.0000 mg | ORAL_TABLET | ORAL | Status: DC | PRN

## 2020-01-16 MED ORDER — MIDAZOLAM HCL 2 MG/2ML IJ SOLN
INTRAMUSCULAR | Status: DC | PRN
  Administered 2020-01-16: 1 mg via INTRAVENOUS

## 2020-01-16 MED ORDER — MIDAZOLAM HCL 2 MG/2ML IJ SOLN
INTRAMUSCULAR | Status: DC | PRN
  Administered 2020-01-16 (×2): 1 mg via INTRAVENOUS

## 2020-01-16 MED ORDER — HYDRALAZINE HCL 20 MG/ML IJ SOLN
INTRAMUSCULAR | Status: AC
  Filled 2020-01-16: qty 1

## 2020-01-16 MED ORDER — FENTANYL CITRATE (PF) 100 MCG/2ML IJ SOLN
INTRAMUSCULAR | Status: DC | PRN
  Administered 2020-01-16 (×2): 25 ug via INTRAVENOUS
  Administered 2020-01-16: 50 ug via INTRAVENOUS

## 2020-01-16 MED ORDER — DIAZEPAM 2 MG PO TABS: 2.0000 mg | ORAL_TABLET | Freq: Two times a day (BID) | ORAL | Status: DC | PRN

## 2020-01-16 MED ORDER — LABETALOL HCL 5 MG/ML IV SOLN: 10.0000 mg | INTRAVENOUS | Status: AC | PRN

## 2020-01-16 MED ORDER — VERAPAMIL HCL 2.5 MG/ML IV SOLN
INTRAVENOUS | Status: AC
  Filled 2020-01-16: qty 2

## 2020-01-16 MED ORDER — CHLORHEXIDINE GLUCONATE CLOTH 2 % EX PADS
6.0000 | MEDICATED_PAD | Freq: Every day | CUTANEOUS | Status: DC
  Administered 2020-01-16: 6 via TOPICAL

## 2020-01-16 MED ORDER — METOPROLOL TARTRATE 5 MG/5ML IV SOLN
INTRAVENOUS | Status: DC | PRN
  Administered 2020-01-16: 5 mg via INTRAVENOUS

## 2020-01-16 MED ORDER — SODIUM CHLORIDE 0.9% FLUSH
3.0000 mL | Freq: Two times a day (BID) | INTRAVENOUS | Status: DC
  Administered 2020-01-17 – 2020-01-19 (×4): 3 mL via INTRAVENOUS

## 2020-01-16 MED ORDER — HEPARIN SODIUM (PORCINE) 1000 UNIT/ML IJ SOLN
INTRAMUSCULAR | Status: AC
  Filled 2020-01-16: qty 1

## 2020-01-16 MED ORDER — HYDROMORPHONE HCL 1 MG/ML IJ SOLN: 0.5000 mg | INTRAMUSCULAR | Status: DC | PRN

## 2020-01-16 MED ORDER — SODIUM CHLORIDE 0.9% FLUSH
3.0000 mL | Freq: Two times a day (BID) | INTRAVENOUS | Status: DC
  Administered 2020-01-16: 3 mL via INTRAVENOUS

## 2020-01-16 MED ORDER — NITROGLYCERIN 1 MG/10 ML FOR IR/CATH LAB
INTRA_ARTERIAL | Status: AC
  Filled 2020-01-16: qty 10

## 2020-01-16 MED ORDER — SODIUM CHLORIDE 0.9% FLUSH
3.0000 mL | Freq: Two times a day (BID) | INTRAVENOUS | Status: DC
Start: 2020-01-16 — End: 2020-01-16

## 2020-01-16 MED ORDER — LOPERAMIDE HCL 2 MG PO CAPS: 2.0000 mg | ORAL_CAPSULE | Freq: Four times a day (QID) | ORAL | Status: DC | PRN

## 2020-01-16 MED ORDER — FENTANYL CITRATE (PF) 100 MCG/2ML IJ SOLN
INTRAMUSCULAR | Status: AC
  Filled 2020-01-16: qty 2

## 2020-01-16 MED ORDER — HEPARIN SODIUM (PORCINE) 1000 UNIT/ML IJ SOLN
INTRAMUSCULAR | Status: DC | PRN
  Administered 2020-01-16: 4000 [IU] via INTRAVENOUS
  Administered 2020-01-16: 2000 [IU] via INTRAVENOUS
  Administered 2020-01-16: 3000 [IU] via INTRAVENOUS

## 2020-01-16 MED ORDER — HYDRALAZINE HCL 20 MG/ML IJ SOLN
INTRAMUSCULAR | Status: DC | PRN
  Administered 2020-01-16: 10 mg via INTRAVENOUS

## 2020-01-16 MED ORDER — ASPIRIN 81 MG PO CHEW: 81.0000 mg | CHEWABLE_TABLET | Freq: Every day | ORAL | Status: DC

## 2020-01-16 MED ORDER — DOXEPIN HCL 10 MG/ML PO CONC
3.0000 mg | Freq: Every evening | ORAL | Status: DC | PRN
  Filled 2020-01-16: qty 0.3

## 2020-01-16 MED ORDER — LIDOCAINE HCL (PF) 1 % IJ SOLN
INTRAMUSCULAR | Status: DC | PRN
  Administered 2020-01-16: 20 mL

## 2020-01-16 MED ORDER — LIDOCAINE HCL (PF) 1 % IJ SOLN
INTRAMUSCULAR | Status: DC | PRN
  Administered 2020-01-16: 2 mL

## 2020-01-16 MED ORDER — METOPROLOL TARTRATE 5 MG/5ML IV SOLN
INTRAVENOUS | Status: AC
  Filled 2020-01-16: qty 5

## 2020-01-16 MED ORDER — IOHEXOL 350 MG/ML SOLN
INTRAVENOUS | Status: DC | PRN
  Administered 2020-01-16: 25 mL

## 2020-01-16 MED ORDER — ASPIRIN 81 MG PO CHEW: 324.0000 mg | CHEWABLE_TABLET | ORAL | Status: AC

## 2020-01-16 MED ORDER — MORPHINE SULFATE (PF) 4 MG/ML IV SOLN
INTRAVENOUS | Status: DC | PRN
  Administered 2020-01-16: 2 mg via INTRAVENOUS

## 2020-01-16 MED ORDER — INSULIN ASPART 100 UNIT/ML ~~LOC~~ SOLN
0.0000 [IU] | Freq: Three times a day (TID) | SUBCUTANEOUS | Status: DC
  Administered 2020-01-18 – 2020-01-19 (×2): 3 [IU] via SUBCUTANEOUS

## 2020-01-16 MED ORDER — FERROUS SULFATE 325 (65 FE) MG PO TABS
325.0000 mg | ORAL_TABLET | Freq: Every day | ORAL | Status: DC
  Administered 2020-01-17 – 2020-01-18 (×2): 325 mg via ORAL
  Filled 2020-01-16 (×2): qty 1

## 2020-01-16 MED ORDER — FENTANYL CITRATE (PF) 100 MCG/2ML IJ SOLN
INTRAMUSCULAR | Status: DC | PRN
  Administered 2020-01-16: 25 ug via INTRAVENOUS

## 2020-01-16 MED ORDER — IRBESARTAN 150 MG PO TABS
150.0000 mg | ORAL_TABLET | Freq: Every day | ORAL | Status: DC
  Administered 2020-01-17 – 2020-01-18 (×2): 150 mg via ORAL
  Filled 2020-01-16 (×3): qty 1

## 2020-01-16 MED ORDER — HEPARIN BOLUS VIA INFUSION
3000.0000 [IU] | Freq: Once | INTRAVENOUS | Status: AC
  Administered 2020-01-16: 3000 [IU] via INTRAVENOUS

## 2020-01-16 MED ORDER — HYDRALAZINE HCL 20 MG/ML IJ SOLN
10.0000 mg | INTRAMUSCULAR | Status: DC | PRN
Start: 2020-01-16 — End: 2020-01-16

## 2020-01-16 MED ORDER — HEPARIN SODIUM (PORCINE) 5000 UNIT/ML IJ SOLN
5000.0000 [IU] | Freq: Three times a day (TID) | INTRAMUSCULAR | Status: DC
  Administered 2020-01-17 – 2020-01-19 (×7): 5000 [IU] via SUBCUTANEOUS
  Filled 2020-01-16 (×7): qty 1

## 2020-01-16 MED ORDER — PANTOPRAZOLE SODIUM 40 MG PO TBEC
40.0000 mg | DELAYED_RELEASE_TABLET | Freq: Every day | ORAL | Status: DC
  Administered 2020-01-17 – 2020-01-19 (×3): 40 mg via ORAL
  Filled 2020-01-16 (×3): qty 1

## 2020-01-16 MED ORDER — NITROGLYCERIN IN D5W 200-5 MCG/ML-% IV SOLN
5.0000 ug/min | INTRAVENOUS | Status: DC
  Administered 2020-01-16: 5 ug/min via INTRAVENOUS
  Filled 2020-01-16: qty 250

## 2020-01-16 MED ORDER — CLOPIDOGREL BISULFATE 300 MG PO TABS
ORAL_TABLET | ORAL | Status: AC
  Filled 2020-01-16: qty 1

## 2020-01-16 MED ORDER — OMEGA-3-ACID ETHYL ESTERS 1 G PO CAPS
1.0000 g | ORAL_CAPSULE | Freq: Every day | ORAL | Status: DC
  Administered 2020-01-17 – 2020-01-19 (×3): 1 g via ORAL
  Filled 2020-01-16 (×3): qty 1

## 2020-01-16 MED ORDER — ASPIRIN EC 81 MG PO TBEC
81.0000 mg | DELAYED_RELEASE_TABLET | Freq: Every morning | ORAL | Status: DC
  Administered 2020-01-17 – 2020-01-19 (×3): 81 mg via ORAL
  Filled 2020-01-16 (×3): qty 1

## 2020-01-16 MED ORDER — CLOPIDOGREL BISULFATE 75 MG PO TABS
ORAL_TABLET | ORAL | Status: DC | PRN
  Administered 2020-01-16: 75 mg via ORAL

## 2020-01-16 SURGICAL SUPPLY — 10 items
CATH INFINITI 5FR JL4 (CATHETERS) ×2 IMPLANT
CATH INFINITI JR4 5F (CATHETERS) ×2 IMPLANT
CLOSURE MYNX CONTROL 5F (Vascular Products) ×2 IMPLANT
KIT HEART LEFT (KITS) ×2 IMPLANT
PACK CARDIAC CATHETERIZATION (CUSTOM PROCEDURE TRAY) ×2 IMPLANT
SHEATH PINNACLE 5F 10CM (SHEATH) ×2 IMPLANT
SHEATH PROBE COVER 6X72 (BAG) ×2 IMPLANT
TRANSDUCER W/STOPCOCK (MISCELLANEOUS) ×2 IMPLANT
TUBING CIL FLEX 10 FLL-RA (TUBING) ×2 IMPLANT
WIRE EMERALD 3MM-J .035X150CM (WIRE) ×2 IMPLANT

## 2020-01-16 SURGICAL SUPPLY — 20 items
BALLN SAPPHIRE 2.5X15 (BALLOONS) ×2
BALLN SAPPHIRE ~~LOC~~ 4.0X18 (BALLOONS) ×2 IMPLANT
BALLOON SAPPHIRE 2.5X15 (BALLOONS) ×1 IMPLANT
CATH 5FR JL3.5 JR4 ANG PIG MP (CATHETERS) ×2 IMPLANT
CATH INFINITI 5 FR JL3.5 (CATHETERS) ×2 IMPLANT
CATH OPTICROSS HD (CATHETERS) ×2 IMPLANT
CATH VISTA GUIDE 6FR XBRCA (CATHETERS) ×2 IMPLANT
DEVICE RAD COMP TR BAND LRG (VASCULAR PRODUCTS) ×2 IMPLANT
GLIDESHEATH SLEND SS 6F .021 (SHEATH) ×2 IMPLANT
GUIDEWIRE INQWIRE 1.5J.035X260 (WIRE) ×1 IMPLANT
INQWIRE 1.5J .035X260CM (WIRE) ×2
KIT ENCORE 26 ADVANTAGE (KITS) ×2 IMPLANT
KIT HEART LEFT (KITS) ×2 IMPLANT
KIT HEMO VALVE WATCHDOG (MISCELLANEOUS) ×2 IMPLANT
PACK CARDIAC CATHETERIZATION (CUSTOM PROCEDURE TRAY) ×2 IMPLANT
SLED PULL BACK IVUS (MISCELLANEOUS) ×2 IMPLANT
STENT RESOLUTE ONYX 3.5X38 (Permanent Stent) ×2 IMPLANT
TRANSDUCER W/STOPCOCK (MISCELLANEOUS) ×2 IMPLANT
TUBING CIL FLEX 10 FLL-RA (TUBING) ×2 IMPLANT
WIRE ASAHI GRAND SLAM 180CM (WIRE) ×2 IMPLANT

## 2020-01-16 NOTE — Progress Notes (Signed)
Patient arrived to 6E-26 from cath lab at 1600 C/O chest pain that continued to worsen and radiated to jaw. Paged PA Sande Rives who came to bedside. Performed EKG that showed ST elevation. Patient returned to cath lab. Placed zoll pads as a precaution. Charge nurse Ferne Coe, RN and PA Sande Rives stayed with patient down to cath lab.

## 2020-01-16 NOTE — Interval H&P Note (Signed)
Cath Lab Visit (complete for each Cath Lab visit)  Clinical Evaluation Leading to the Procedure:   ACS: Yes.    Non-ACS:    Anginal Classification: CCS IV  Anti-ischemic medical therapy: Minimal Therapy (1 class of medications)  Non-Invasive Test Results: No non-invasive testing performed  Prior CABG: No previous CABG      History and Physical Interval Note:  01/16/2020 2:41 PM  Crystal [name]  has presented today for surgery, with the diagnosis of unstable angina.  The various methods of treatment have been discussed with the patient and family. After consideration of risks, benefits and other options for treatment, the patient has consented to  Procedure(s): LEFT HEART CATH AND CORONARY ANGIOGRAPHY (N/A) as a surgical intervention.  The patient's history has been reviewed, patient examined, no change in status, stable for surgery.  I have reviewed the patient's chart and labs.  Questions were answered to the patient's satisfaction.     Larae Grooms

## 2020-01-16 NOTE — ED Provider Notes (Addendum)
[facility] EMERGENCY DEPARTMENT Provider Note   CSN: [id number] Arrival date & time: 01/16/20  0131   Time seen 2:09 AM  History Chief Complaint  Patient presents with  . Chest Pain    Crystal [name] is a 85 y.o. female.  HPI   Patient states she woke up at 4 AM on December 19 with left-sided chest pain that she described as aching that lasted about 5 minutes and went away with 1 nitroglycerin.  She states she went to church today and had some loss of appetite but otherwise felt okay.  She went to bed about 10 PM but she woke up at midnight again with left-sided chest pain that radiated into her left posterior shoulder.  The chest pain was aching and the shoulder pain is sharp and stabbing.  She took Tylenol and 1 nitroglycerin.  She states the chest pain only lasted about a minute but she continues to have pain in her left posterior shoulder.  She denies nausea, vomiting, diaphoresis or swelling of her extremities.  She does feel short of breath.  She states this is the first time she has had chest pain since she had her stent placed in January 2021.  She denies any other change of her activity today.  She states she is compliant with her medication.  Patient has had the Moderna Covid vaccine  PCP Glenda Chroman, MD Cardiology Dr. Domenic Polite  Past Medical History:  Diagnosis Date  . Bell's palsy   . Breast cancer ([place]) 1998   Right mastectomy  . CAD (coronary artery disease)    a. s/p STEMI in 01/2019 with DES to mid-LAD  . CHF (congestive heart failure) ([place])    a. EF 30-35% by echo in 01/2019  . Essential hypertension   . GERD (gastroesophageal reflux disease)   . Gout   . History of skin cancer    Squamous cell, left shoulder  . Mixed hyperlipidemia   . Osteopenia   . Reflux esophagitis   . Thyroid nodule   . Type 2 diabetes mellitus [facility])     Patient Active Problem List   Diagnosis Date Noted  . Dysphagia 09/21/2019  . Anemia 09/21/2019  . Diarrhea 07/06/2019   . Acute on chronic combined systolic and diastolic CHF (congestive heart failure) ([place]) 02/28/2019  . Acute respiratory failure with hypoxemia ([place]) 02/28/2019  . Acute pulmonary edema (H[facility])   . Hyperlipidemia LDL goal <70   . Acute renal failure superimposed on chronic kidney disease ([place])   . CHF exacerbation ([place]) 02/27/2019  . CAD (coronary artery disease) 02/27/2019  . STEMI (ST elevation myocardial infarction) ([place]) 01/30/2019  . NSTEMI (non-ST elevated myocardial infarction) ([place]) 01/30/2019  . Non-ST elevation (NSTEMI) myocardial infarction ([place])   . Renal artery stenosis ([place]) 12/02/2017  . Diabetes mellitus ([place]) 03/04/2011  . Hyperlipidemia 03/04/2011  . Resistant hypertension 03/04/2011    Past Surgical History:  Procedure Laterality Date  . BIOPSY  10/13/2019   Procedure: BIOPSY;  Surgeon: Daneil Dolin, MD;  [facility];  Service: Endoscopy;;  . CATARACT EXTRACTION  2016  . CORONARY/GRAFT ACUTE MI REVASCULARIZATION N/A 01/30/2019   Procedure: Coronary/Graft Acute MI Revascularization;  Surgeon: Burnell Blanks, MD;  Location: [place] CV LAB;  Service: Cardiovascular;  Laterality: N/A;  . ESOPHAGOGASTRODUODENOSCOPY (EGD) WITH PROPOFOL N/A 10/13/2019   Non-obstructing Schatzki ring at GE junction, s/p dilation, erosive gastropathy with stigmata of recent bleeding, normal duodenum. Negative H.pylori.   Mar[facility] Kitchen HYSTEROSCOPY    .  LEFT HEART CATH AND CORONARY ANGIOGRAPHY N/A 01/30/2019   Procedure: LEFT HEART CATH AND CORONARY ANGIOGRAPHY;  Surgeon: Burnell Blanks, MD;  Location: [place] CV LAB;  Service: Cardiovascular;  Laterality: N/A;  . Venia Minks DILATION N/A 10/13/2019   Procedure: Venia Minks DILATION;  Surgeon: Daneil Dolin, MD;  Location[facility];  Service: Endoscopy;  Laterality: N/A;  . Right mastectomy  1998   [place]     OB History   No obstetric history on file.     Family History  Problem Relation Age of Onset  . Stroke  Mother   . Heart attack Father   . Aneurysm Sister   . Heart attack Maternal Uncle   . Heart disease Maternal Uncle   . Heart attack Paternal Aunt   . Heart disease Paternal Aunt   . Heart attack Paternal Grandfather   . Heart disease Paternal Grandfather   . Heart attack Paternal Aunt   . Heart disease Paternal Aunt   . Heart attack Maternal Uncle   . Heart disease Maternal Uncle   . Heart attack Maternal Uncle   . Heart disease Maternal Uncle   . Heart attack Sister   . Heart disease Sister   . Cancer Sister   . Colon cancer Neg Hx   . Colon polyps Neg Hx     Social History   Tobacco Use  . Smoking status: Never Smoker  . Smokeless tobacco: Never Used  Vaping Use  . Vaping Use: Never used  Substance Use Topics  . Alcohol use: Never  . Drug use: Never  Lives at home  Home Medications Prior to Admission medications   Medication Sig Start Date End Date Taking? Authorizing Provider  acetaminophen (TYLENOL) 500 MG tablet Take 500 mg by mouth every 6 (six) hours as needed for headache (pain).    [provider]  Alogliptin Benzoate 12.5 MG TABS Take 12.5 mg by mouth daily at 2 PM.  06/29/19   [provider]  aspirin EC 81 MG tablet Take 81 mg by mouth every morning. (0930)    [provider]  Calcium Carbonate-Vitamin D (CALCIUM 500 + D PO) Take 1 tablet by mouth daily before lunch. Citracal plus D3    [provider]  candesartan (ATACAND) 16 MG tablet Take 1 tablet (16 mg total) by mouth in the morning. And 8 mg (1/2 tablet) in the evening 12/19/19 12/18/20  Satira Sark, MD  carvedilol (COREG) 25 MG tablet Take 1 tablet (25 mg total) by mouth in the morning and at bedtime. Breakfast & supper 11/28/19   Satira Sark, MD  cholecalciferol (VITAMIN D3) 25 MCG (1000 UT) tablet Take 1,000 Units by mouth daily with breakfast.    [provider]  CINNAMON PO Take 1,000 mg by mouth daily before lunch.     [provider]  clopidogrel (PLAVIX) 75 MG tablet TAKE 1 TABLET BY MOUTH DAILY--STOP BRILLINTA. Patient taking differently: Take 75 mg by mouth every morning. 10/05/19   Satira Sark, MD  diazepam (VALIUM) 2 MG tablet Take 2 mg by mouth 2 (two) times daily as needed (dizzy spells.).    [provider]  Doxepin HCl 3 MG TABS Take 3 mg by mouth at bedtime as needed (sleep.).  09/26/19   [provider]  ferrous sulfate 325 (65 FE) MG tablet Take 325 mg by mouth daily before lunch.    [provider]  furosemide (LASIX) 20 MG tablet Take  1 tablet (20 mg total) by mouth daily. 03/10/19 10/05/20  Strader, Fransisco Hertz, PA-C  isosorbide mononitrate (IMDUR) 30 MG 24 hr tablet TAKE 1 TABLET EVERY EVENING. 12/09/19   Satira Sark, MD  loperamide (IMODIUM) 2 MG capsule Take 2-4 mg by mouth 4 (four) times daily as needed for diarrhea or loose stools.    [provider]  meclizine (ANTIVERT) 25 MG tablet Take 25 mg by mouth 2 (two) times daily as needed for dizziness.    [provider]  Multiple Vitamin (MULTIVITAMIN WITH MINERALS) TABS tablet Take 1 tablet by mouth daily. Centrum Silver for Women    [provider]  multivitamin-lutein (OCUVITE-LUTEIN) CAPS capsule Take 1 capsule by mouth daily before lunch.     [provider]  nitroGLYCERIN (NITROSTAT) 0.4 MG SL tablet Place 1 tablet (0.4 mg total) under the tongue every 5 (five) minutes x 3 doses as needed for chest pain. 02/02/19   Cheryln Manly, NP  Omega-3 Fatty Acids (FISH OIL PO) Take 1,576 mg by mouth daily before lunch.    [provider]  pantoprazole (PROTONIX) 40 MG tablet Take 1 tablet (40 mg total) by mouth daily. Take 30 minutes before breakfast 01/12/20   Annitta Needs, NP  saccharomyces boulardii (FLORASTOR) 250 MG capsule Take 250 mg by mouth 2 (two) times daily as needed (diarrhea).     [provider]  sodium chloride (OCEAN) 0.65 % SOLN nasal spray Place 1 spray  into both nostrils 2 (two) times daily as needed for congestion.     [provider]  spironolactone (ALDACTONE) 25 MG tablet TAKE (1/2) TABLET BY MOUTH DAILY. Patient taking differently: Take 12.5 mg by mouth daily. 09/28/19   Satira Sark, MD    Allergies    Bactrim [sulfamethoxazole-trimethoprim], Sulfa antibiotics, Amlodipine, Clonidine derivatives, Evista [raloxifene hcl], Fosamax [alendronate sodium], Glipizide, Lipitor [atorvastatin calcium], Losartan, Pravastatin, and Azithromycin  Review of Systems   Review of Systems  All other systems reviewed and are negative.   Physical Exam Updated Vital Signs BP (!) 187/60   Pulse 66   Temp 98.2 F (36.8 C) (Oral)   Resp (!) 21   Ht 5\' 5"  (1.651 m)   Wt 59 kg   SpO2 96%   BMI 21.63 kg/m   Physical Exam Vitals and nursing note reviewed.  Constitutional:      General: She is not in acute distress.    Appearance: Normal appearance. She is normal weight. She is not ill-appearing or toxic-appearing.  HENT:     Head: Normocephalic and atraumatic.     Right Ear: External ear normal.     Left Ear: External ear normal.  Eyes:     Extraocular Movements: Extraocular movements intact.     Conjunctiva/sclera: Conjunctivae normal.     Pupils: Pupils are equal, round, and reactive to light.  Cardiovascular:     Rate and Rhythm: Normal rate and regular rhythm.     Pulses: Normal pulses.     Heart sounds: No murmur heard.   Pulmonary:     Effort: Pulmonary effort is normal. No respiratory distress.     Breath sounds: Normal breath sounds.  Musculoskeletal:     Cervical back: Normal range of motion.     Right lower leg: No edema.     Left lower leg: No edema.  Skin:    General: Skin is warm and dry.  Neurological:     General: No focal deficit present.  Mental Status: She is alert and oriented to person, place, and time.     Cranial Nerves: No cranial nerve deficit.  Psychiatric:        Mood and Affect: Mood  normal.        Behavior: Behavior normal.        Thought Content: Thought content normal.     ED Results / Procedures / Treatments   Labs (all labs ordered are listed, but only abnormal results are displayed) Results for orders placed or performed during the hospital encounter of 01/16/20  Resp Panel by RT-PCR (Flu A&B, Covid) Nasopharyngeal Swab   Specimen: Nasopharyngeal Swab; Nasopharyngeal(NP) swabs in vial transport medium  Result Value Ref Range   SARS Coronavirus 2 by RT PCR NEGATIVE NEGATIVE   Influenza A by PCR NEGATIVE NEGATIVE   Influenza B by PCR NEGATIVE NEGATIVE  Comprehensive metabolic panel  Result Value Ref Range   Sodium 126 (L) 135 - 145 mmol/L   Potassium 4.4 3.5 - 5.1 mmol/L   Chloride 95 (L) 98 - 111 mmol/L   CO2 23 22 - 32 mmol/L   Glucose, Bld 108 (H) 70 - 99 mg/dL   BUN 38 (H) 8 - 23 mg/dL   Creatinine, Ser 1.66 (H) 0.44 - 1.00 mg/dL   Calcium 9.2 8.9 - 10.3 mg/dL   Total Protein 6.9 6.5 - 8.1 g/dL   Albumin 4.1 3.5 - 5.0 g/dL   AST 24 15 - 41 U/L   ALT 14 0 - 44 U/L   Alkaline Phosphatase 45 38 - 126 U/L   Total Bilirubin 0.5 0.3 - 1.2 mg/dL   GFR, Estimated 30 (L) >60 mL/min   Anion gap 8 5 - 15  CBC with Differential  Result Value Ref Range   WBC 9.0 4.0 - 10.5 K/uL   RBC 3.95 3.87 - 5.11 MIL/uL   Hemoglobin 11.2 (L) 12.0 - 15.0 g/dL   HCT 33.0 (L) 36.0 - 46.0 %   MCV 83.5 80.0 - 100.0 fL   MCH 28.4 26.0 - 34.0 pg   MCHC 33.9 30.0 - 36.0 g/dL   RDW 13.9 11.5 - 15.5 %   Platelets 318 150 - 400 K/uL   nRBC 0.0 0.0 - 0.2 %   Neutrophils Relative % 56 %   Neutro Abs 5.0 1.7 - 7.7 K/uL   Lymphocytes Relative 28 %   Lymphs Abs 2.6 0.7 - 4.0 K/uL   Monocytes Relative 12 %   Monocytes Absolute 1.1 (H) 0.1 - 1.0 K/uL   Eosinophils Relative 3 %   Eosinophils Absolute 0.3 0.0 - 0.5 K/uL   Basophils Relative 1 %   Basophils Absolute 0.1 0.0 - 0.1 K/uL   Immature Granulocytes 0 %   Abs Immature Granulocytes 0.04 0.00 - 0.07 K/uL  Protime-INR   Result Value Ref Range   Prothrombin Time 13.8 11.4 - 15.2 seconds   INR 1.1 0.8 - 1.2  APTT  Result Value Ref Range   aPTT 130 (H) 24 - 36 seconds  Heparin level (unfractionated)  Result Value Ref Range   Heparin Unfractionated 0.58 0.30 - 0.70 IU/mL  Troponin I (High Sensitivity)  Result Value Ref Range   Troponin I (High Sensitivity) 842 (HH) <18 ng/L  Troponin I (High Sensitivity)  Result Value Ref Range   Troponin I (High Sensitivity) 741 (HH) <18 ng/L   Laboratory interpretation all normal except mild anemia, renal insufficiency which is similar to her prior high level in February 11 but she  has a creatinine generally of at least 1.3, mild hyponatremia, positive initial troponin    EKG EKG Interpretation  Date/Time:  Monday January 16 2020 01:44:31 EST Ventricular Rate:  70 PR Interval:    QRS Duration: 93 QT Interval:  420 QTC Calculation: 454 R Axis:   70 Text Interpretation: Sinus rhythm Nonspecific T abnormalities, inferior leads Since last tracing 27 Feb 2019 T wave inversion now evident in Inferior leads Confirmed by Rolland Porter [phone]) on 01/16/2020 1:51:40 AM   Radiology DG Chest Port 1 View  Result Date: 01/16/2020 CLINICAL DATA:  Bilateral shoulder pain, left greater than right, radiating to the neck and left arm, chest pain began yesterday EXAM: PORTABLE CHEST 1 VIEW COMPARISON:  Radiograph 02/28/2019 FINDINGS: Chronically coarsened interstitial and bronchitic changes are present throughout both lungs. No focal consolidation. No convincing features of edema. Portion of the left costophrenic sulcus is collimated. No visible pneumothorax or pleural effusion. Coronary stent projects over the level a cardiac silhouette. The aorta is calcified. The remaining cardiomediastinal contours are unremarkable. No acute osseous or soft tissue abnormality. Degenerative changes are present in the imaged spine and shoulders. Surgical clips in the right axilla. Telemetry leads  overlie the chest. IMPRESSION: No acute cardiopulmonary abnormality. Chronically coarsened interstitial and bronchitic changes. Prior coronary stenting. Aortic Atherosclerosis (ICD10-I70.0). Electronically Signed   By: Lovena Le M.D.   On: 01/16/2020 02:40    Procedures .Critical Care Performed by: Rolland Porter, MD Authorized by: Rolland Porter, MD   Critical care provider statement:    Critical care time (minutes):  44   Critical care was necessary to treat or prevent imminent or life-threatening deterioration of the following conditions:  Cardiac failure   Critical care was time spent personally by me on the following activities:  Discussions with consultants, examination of patient, obtaining history from patient or surrogate, ordering and review of laboratory studies, ordering and review of radiographic studies, re-evaluation of patient's condition and review of old charts   (including critical care time)  Cardiac catheterization 01/30/2019:  Prox RCA lesion is 60% stenosed.  Mid RCA lesion is 40% stenosed.  Prox Cx to Mid Cx lesion is 20% stenosed.  Mid LAD lesion is 99% stenosed.  A drug-eluting stent was successfully placed using a SYNERGY [id number] [id number].  Post intervention, there is a 0% residual stenosis.  Echocardiogram 06/13/2019: 1. Left ventricular ejection fraction, by estimation, is 45 to 50%. The  left ventricle has mildly decreased function. The left ventricle  demonstrates regional wall motion abnormalities (see scoring  diagram/findings for description). Left ventricular  diastolic parameters are consistent with Grade I diastolic dysfunction  (impaired relaxation). Elevated left ventricular end-diastolic pressure.  2. Right ventricular systolic function is mildly reduced. The right  ventricular size is normal. There is normal pulmonary artery systolic  pressure.  3. Left atrial size was mildly dilated.  4. The mitral valve is degenerative. Mild mitral valve  regurgitation.  5. Tricuspid valve regurgitation is mild to moderate.  6. The aortic valve was not well visualized. Aortic valve regurgitation  is mild. No aortic stenosis is present.  7. The inferior vena cava is normal in size with greater than 50%  respiratory variability, suggesting right atrial pressure of 3 mmHg.    Medications Ordered in ED Medications  nitroGLYCERIN 50 mg in dextrose 5 % 250 mL (0.2 mg/mL) infusion (10 mcg/min Intravenous Rate/Dose Change 01/16/20 0302)  heparin ADULT infusion 100 units/mL (25000 units/258mL sodium chloride 0.45%) (700 Units/hr Intravenous New Bag/Given 01/16/20  0308)  heparin bolus via infusion 3,000 Units (3,000 Units Intravenous Bolus from Bag 01/16/20 0308)    ED Course  I have reviewed the triage vital signs and the nursing notes.  Pertinent labs & imaging results that were available during my care of the patient were reviewed by me and considered in my medical decision making (see chart for details).    MDM Rules/Calculators/A&P                          Patient presented with a STEMI in January 2021 and has a DES to the mid LAD.  She also was noted to have a proximal RCA lesion that was 60% stenosed.  Patient's initial troponin is positive in the ED.  I will discuss with cardiology to see if she should remain here or she needs to be transferred to [facility].  Patient's initial blood pressure was very high over 200, she was started on a nitroglycerin drip for her pain and blood pressure control.  EMS had already given her aspirin.  2:51 AM patient discussed with Dr. Neena Rhymes, cardiology.  He wants her started on a heparin drip, he wants her to be admitted to [facility] to stepdown under Dr. Elmarie Shiley service.  Unfortunately there is no beds available right now, if her condition changes I will call him back and we may need to send her ED to ED.  Unfortunately I will not have cardiology here for about another 6 hours.  3:30 AM  patient's blood pressure is 177/65 on nitroglycerin drip.  She has been started on her heparin bolus and drip.  Recheck at 6:30 AM patient states she still has some chest pain but it is better.  Her delta troponin actually has improved from 842 down to 741.  Patient's blood pressure is 189/71 on a nitroglycerin drip.  Patient is still waiting for a bed to become available at [facility].  I will asked Dr. Laverta Baltimore to have our cardiologist see her here if she still in the emergency department at 830 this morning.   Final Clinical Impression(s) / ED Diagnoses Final diagnoses:  NSTEMI (non-ST elevated myocardial infarction) [facility])    Rx / DC Orders  Plan admission  Rolland Porter, MD, Barbette Or, MD 01/16/20 [place], [place], MD 01/16/20 [place], [place], MD 01/16/20 [phone]

## 2020-01-16 NOTE — Progress Notes (Signed)
*  PRELIMINARY RESULTS* Echocardiogram 2D Echocardiogram has been performed.  Crystal [name] 01/16/2020, 10:12 AM

## 2020-01-16 NOTE — Progress Notes (Addendum)
Received pt from [facility] via [place]. Patient alert and oreinted X4, skin warm and dry , resp even and unlabored, and denies any chest pain, but has left shoulders pain.  IVF infusing with Heparin and Nitro IV. Consent signed and patient stated she was aware of reason for cath procedure. Patient to procedure area via stretcher.

## 2020-01-16 NOTE — ED Notes (Signed)
Pt denies chest pain at this time- pt up to bathroom

## 2020-01-16 NOTE — H&P (View-Only) (Signed)
Received pt from [facility] via [place]. Patient alert and oreinted X4, skin warm and dry , resp even and unlabored, and denies any chest pain, but has left shoulders pain.  IVF infusing with Heparin and Nitro IV. Consent signed and patient stated she was aware of reason for cath procedure. Patient to procedure area via stretcher.

## 2020-01-16 NOTE — Progress Notes (Addendum)
   Called to bedside because patient was having 10/10 chest pain radiating to arm and jaw after cardiac cath today. Patient had DES to RCA. Patient does not appear to be in any distress. No abnormal heart or lung sounds. Increased IV Nitro drip. Ordered repeat EKG which showed ST elevations in inferior leads. Immediately called Dr. Irish Lack and cath lab and patient was taken back down to cath lab for re-look cath with Dr. Irish Lack. I stayed with patient all the way down to cath lab.   Darreld Mclean, PA-C 01/16/2020 4:59 PM  I have examined the patient and reviewed assessment and plan and discussed with patient.  Agree with above as stated.  Discussed withCallie Goodrich at length and multiple ECGs reviewed. I reviewed the patient's ECG.  She had significantly more ST elevation noted than the immediate post PCI ECG.  Pre cath ECG showed inferior TWI.   She was reporting severe pain, although appeared somewhat stoic with flat affect.  Even during the cath, she was reporting pain but STs on monitor were stable and there was TIMI 3 flow on angio.   RRR No wheezing TR band still on right wrist.   2+ right femoral; pulse  IVUS done on stent at initial cath showed well apposed stent and no edge dissections.   Decided to repeat cath from the right groin.  As I suspect this may be from microvascular disease related to no reflow, will use a 5 Fr sheath.  Another option would be to give IIb/IIIa inhibitor and see if ST's improve, but given her age, I don't want to increase bleeding risk.  She is intolerant of Brilinta unfortunately.  WIll also give extra dose of Plavix 300 mg x 1 if no obvious obstruction found at cath.  Hopefully, we can close her right groin site.    CRITICAL CARE Performed by: Larae Grooms   Total critical care time: 35 minutes  Critical care time was exclusive of separately billable procedures and treating other patients.  Critical care was necessary to treat or prevent  imminent or life-threatening deterioration.  Critical care was time spent personally by me on the following activities: development of treatment plan with patient and/or surrogate as well as nursing, discussions with consultants, evaluation of patient's response to treatment, examination of patient, obtaining history from patient or surrogate, ordering and performing treatments and interventions, ordering and review of laboratory studies, ordering and review of radiographic studies, pulse oximetry and re-evaluation of patient's condition.   Larae Grooms

## 2020-01-16 NOTE — ED Notes (Signed)
Left facility with [facility]

## 2020-01-16 NOTE — ED Triage Notes (Addendum)
Pt c/o chest pain that started yesterday that got better with nitro. Chest pain began again and she took nitro again around 0000 with no relief.   EMS gave 324 of aspirin

## 2020-01-16 NOTE — Progress Notes (Signed)
ANTICOAGULATION CONSULT NOTE - Initial Consult  Pharmacy Consult for Heparin Indication: chest pain/ACS  Allergies  Allergen Reactions  . Bactrim [Sulfamethoxazole-Trimethoprim] Nausea And Vomiting  . Sulfa Antibiotics Nausea And Vomiting  . Amlodipine Other (See Comments)    EDEMA  . Clonidine Derivatives Other (See Comments)    FATIGUE  . Evista [Raloxifene Hcl] Other (See Comments)    GERD  . Fosamax [Alendronate Sodium] Other (See Comments)    INCREASED GERD   . Glipizide Other (See Comments)    PALPITATIONS  . Lipitor [Atorvastatin Calcium] Other (See Comments)    ACHING  . Losartan Other (See Comments)    FATIGUE   . Pravastatin Other (See Comments)    ACHING  . Azithromycin Rash and Other (See Comments)    FACIAL BURNING     Patient Measurements: Height: 5\' 5"  (165.1 cm) Weight: 59 kg (130 lb) IBW/kg (Calculated) : 57  Vital Signs: Temp: 98.2 F (36.8 C) (12/20 0145) Temp Source: Oral (12/20 0145) BP: 189/64 (12/20 0230) Pulse Rate: 64 (12/20 0230)  Labs: Recent Labs    01/16/20 0153  HGB 11.2*  HCT 33.0*  PLT 318  CREATININE 1.66*  TROPONINIHS 842*    Estimated Creatinine Clearance: 23.1 mL/min (A) (by C-G formula based on SCr of 1.66 mg/dL (H)).   Medical History: Past Medical History:  Diagnosis Date  . Bell's palsy   . Breast cancer ([place]) 1998   Right mastectomy  . CAD (coronary artery disease)    a. s/p STEMI in 01/2019 with DES to mid-LAD  . CHF (congestive heart failure) ([place])    a. EF 30-35% by echo in 01/2019  . Essential hypertension   . GERD (gastroesophageal reflux disease)   . Gout   . History of skin cancer    Squamous cell, left shoulder  . Mixed hyperlipidemia   . Osteopenia   . Reflux esophagitis   . Thyroid nodule   . Type 2 diabetes mellitus (HCC)     Medications:  No current facility-administered medications on file prior to encounter.   Current Outpatient Medications on File Prior to Encounter  Medication Sig  Dispense Refill  . acetaminophen (TYLENOL) 500 MG tablet Take 500 mg by mouth every 6 (six) hours as needed for headache (pain).    . Alogliptin Benzoate 12.5 MG TABS Take 12.5 mg by mouth daily at 2 PM.     . aspirin EC 81 MG tablet Take 81 mg by mouth every morning. (0930)    . Calcium Carbonate-Vitamin D (CALCIUM 500 + D PO) Take 1 tablet by mouth daily before lunch. Citracal plus D3    . candesartan (ATACAND) 16 MG tablet Take 1 tablet (16 mg total) by mouth in the morning. And 8 mg (1/2 tablet) in the evening 45 tablet 6  . carvedilol (COREG) 25 MG tablet Take 1 tablet (25 mg total) by mouth in the morning and at bedtime. Breakfast & supper 180 tablet 3  . cholecalciferol (VITAMIN D3) 25 MCG (1000 UT) tablet Take 1,000 Units by mouth daily with breakfast.    . CINNAMON PO Take 1,000 mg by mouth daily before lunch.     . clopidogrel (PLAVIX) 75 MG tablet TAKE 1 TABLET BY MOUTH DAILY--STOP BRILLINTA. (Patient taking differently: Take 75 mg by mouth every morning.) 90 tablet 1  . diazepam (VALIUM) 2 MG tablet Take 2 mg by mouth 2 (two) times daily as needed (dizzy spells.).    Marland Kitchen Doxepin HCl 3 MG TABS Take 3  mg by mouth at bedtime as needed (sleep.).     Marland Kitchen ferrous sulfate 325 (65 FE) MG tablet Take 325 mg by mouth daily before lunch.    . furosemide (LASIX) 20 MG tablet Take 1 tablet (20 mg total) by mouth daily. 90 tablet 3  . isosorbide mononitrate (IMDUR) 30 MG 24 hr tablet TAKE 1 TABLET EVERY EVENING. 90 tablet 2  . loperamide (IMODIUM) 2 MG capsule Take 2-4 mg by mouth 4 (four) times daily as needed for diarrhea or loose stools.    . meclizine (ANTIVERT) 25 MG tablet Take 25 mg by mouth 2 (two) times daily as needed for dizziness.    . Multiple Vitamin (MULTIVITAMIN WITH MINERALS) TABS tablet Take 1 tablet by mouth daily. Centrum Silver for Women    . multivitamin-lutein (OCUVITE-LUTEIN) CAPS capsule Take 1 capsule by mouth daily before lunch.     . nitroGLYCERIN (NITROSTAT) 0.4 MG SL tablet  Place 1 tablet (0.4 mg total) under the tongue every 5 (five) minutes x 3 doses as needed for chest pain. 25 tablet 2  . Omega-3 Fatty Acids (FISH OIL PO) Take 1,576 mg by mouth daily before lunch.    . pantoprazole (PROTONIX) 40 MG tablet Take 1 tablet (40 mg total) by mouth daily. Take 30 minutes before breakfast 90 tablet 3  . saccharomyces boulardii (FLORASTOR) 250 MG capsule Take 250 mg by mouth 2 (two) times daily as needed (diarrhea).     . sodium chloride (OCEAN) 0.65 % SOLN nasal spray Place 1 spray into both nostrils 2 (two) times daily as needed for congestion.     Marland Kitchen spironolactone (ALDACTONE) 25 MG tablet TAKE (1/2) TABLET BY MOUTH DAILY. (Patient taking differently: Take 12.5 mg by mouth daily.) 45 tablet 8     Assessment: 85 y.o. female with chest pain for heparin  Goal of Therapy:  Heparin level 0.3-0.7 units/ml Monitor platelets by anticoagulation protocol: Yes   Plan:  Heparin 3000 units IV bolus, then start heparin 700 units/hr Check heparin level in 8 hours.   Deem Marmol, Bronson Curb 01/16/2020,2:57 AM

## 2020-01-16 NOTE — ED Notes (Signed)
Date and time results received: 01/16/20 2:39 AM (use smartphrase ".now" to insert current time)  Test: troponin Critical Value: 842  Name of Provider Notified: Dr Tomi Bamberger  Orders Received? Or Actions Taken?: see chart

## 2020-01-16 NOTE — ED Notes (Signed)
carelink at bedside for transport to [facility]

## 2020-01-16 NOTE — Progress Notes (Signed)
CC'ED TO PCP 

## 2020-01-16 NOTE — Progress Notes (Addendum)
ANTICOAGULATION CONSULT NOTE -   Pharmacy Consult for heparin Indication: chest pain/ACS  Allergies  Allergen Reactions  . Bactrim [Sulfamethoxazole-Trimethoprim] Nausea And Vomiting  . Sulfa Antibiotics Nausea And Vomiting  . Amlodipine Other (See Comments)    EDEMA  . Clonidine Derivatives Other (See Comments)    FATIGUE  . Evista [Raloxifene Hcl] Other (See Comments)    GERD  . Fosamax [Alendronate Sodium] Other (See Comments)    INCREASED GERD   . Glipizide Other (See Comments)    PALPITATIONS  . Lipitor [Atorvastatin Calcium] Other (See Comments)    ACHING  . Losartan Other (See Comments)    FATIGUE   . Pravastatin Other (See Comments)    ACHING  . Azithromycin Rash and Other (See Comments)    FACIAL BURNING     Patient Measurements: Height: 5\' 5"  (165.1 cm) Weight: 59 kg (130 lb) IBW/kg (Calculated) : 57 Heparin Dosing Weight: 59 kg  Vital Signs: Temp: 97.9 F (36.6 C) (12/20 0718) Temp Source: Oral (12/20 0718) BP: 188/53 (12/20 0915) Pulse Rate: 73 (12/20 0915)  Labs: Recent Labs    01/16/20 0153 01/16/20 0510 01/16/20 0804 01/16/20 1003  HGB 11.2*  --   --   --   HCT 33.0*  --   --   --   PLT 318  --   --   --   APTT  --  130*  --   --   LABPROT  --  13.8  --   --   INR  --  1.1  --   --   HEPARINUNFRC  --  0.58  --  0.29*  CREATININE 1.66*  --   --   --   TROPONINIHS 842* 741* 675* 614*    Estimated Creatinine Clearance: 23.1 mL/min (A) (by C-G formula based on SCr of 1.66 mg/dL (H)).   Medical History: Past Medical History:  Diagnosis Date  . Bell's palsy   . Breast cancer ([place]) 1998   Right mastectomy  . CAD (coronary artery disease)    a. s/p STEMI in 01/2019 with DES to mid-LAD  . CHF (congestive heart failure) ([place])    a. EF 30-35% by echo in 01/2019  . Essential hypertension   . GERD (gastroesophageal reflux disease)   . Gout   . History of skin cancer    Squamous cell, left shoulder  . Mixed hyperlipidemia   . Osteopenia    . Reflux esophagitis   . Thyroid nodule   . Type 2 diabetes mellitus (HCC)     Medications:  (Not in a hospital admission)   Assessment: Pharmacy consulted to dose heparin in patient with chest pain/NSTEMI.  Patient is not on anticoagulation prior to admission.   HL 0.29 Troponin 614 CBC WNL  Goal of Therapy:  Heparin level 0.3-0.7 units/ml Monitor platelets by anticoagulation protocol: Yes   Plan:  Increase heparin infusion to 850 units/hr. Heparin level in 8 hours and daily. Monitor H&H and s/s of bleeding.  Margot Ables, PharmD Clinical Pharmacist 01/16/2020 10:53 AM

## 2020-01-16 NOTE — H&P (Signed)
Cardiology Admission History and Physical:   Patient ID: Crystal [name] MRN: [id number]; DOB: 1934/02/10   Admission date: 01/16/2020  Primary Care Provider: Glenda Chroman, MD [facility] Cardiologist: Rozann Lesches, MD  [place] Electrophysiologist:  None   Chief Complaint:  Chest pain  Patient Profile:   Crystal [name] is a 85 y.o. female with history of STEMI Jan 2021 with DES to LAD presents with chest pain.   History of Present Illness:   Crystal [name] 85 yo female history of CAD with prior STEMI in Jan 2021 with DES to mid LAD, chronic systolic HF prior LVEF 41-93% most recent 45-50%, HTN, CKD3 presents with chest pain.   Intermittent SOB and fatigue over the last week. Yesterday began having left chest and shoulder pain, sharp pain also radiating down left arm. Better with NG.    K 4.4 Cr 1.66 Na 126 WBC 9 Hgb 11.2 Plt 318 INR 1.1 hstrop 843-->741 COVID neg CXR no acute process EKG SR, new inferior TWI Past Medical History:  Diagnosis Date  . Bell's palsy   . Breast cancer ([place]) 1998   Right mastectomy  . CAD (coronary artery disease)    a. s/p STEMI in 01/2019 with DES to mid-LAD  . CHF (congestive heart failure) ([place])    a. EF 30-35% by echo in 01/2019  . Essential hypertension   . GERD (gastroesophageal reflux disease)   . Gout   . History of skin cancer    Squamous cell, left shoulder  . Mixed hyperlipidemia   . Osteopenia   . Reflux esophagitis   . Thyroid nodule   . Type 2 diabetes mellitus ([place])     Past Surgical History:  Procedure Laterality Date  . BIOPSY  10/13/2019   Procedure: BIOPSY;  Surgeon: Daneil Dolin, MD;  [facility];  Service: Endoscopy;;  . CATARACT EXTRACTION  2016  . CORONARY/GRAFT ACUTE MI REVASCULARIZATION N/A 01/30/2019   Procedure: Coronary/Graft Acute MI Revascularization;  Surgeon: Burnell Blanks, MD;  Location: [facility];  Service: Cardiovascular;  Laterality: N/A;  .  ESOPHAGOGASTRODUODENOSCOPY (EGD) WITH PROPOFOL N/A 10/13/2019   Non-obstructing Schatzki ring at GE junction, s/p dilation, erosive gastropathy with stigmata of recent bleeding, normal duodenum. Negative H.pylori.   Marland Kitchen HYSTEROSCOPY    . LEFT HEART CATH AND CORONARY ANGIOGRAPHY N/A 01/30/2019   Procedure: LEFT HEART CATH AND CORONARY ANGIOGRAPHY;  Surgeon: Burnell Blanks, MD;  Location: [facility];  Service: Cardiovascular;  Laterality: N/A;  . Venia Minks DILATION N/A 10/13/2019   Procedure: Venia Minks DILATION;  Surgeon: Daneil Dolin, MD;  Location[facility];  Service: Endoscopy;  Laterality: N/A;  . Right mastectomy  1998   [place]     Medications Prior to Admission: Prior to Admission medications   Medication Sig Start Date End Date Taking? Authorizing Provider  acetaminophen (TYLENOL) 500 MG tablet Take 500 mg by mouth every 6 (six) hours as needed for headache (pain).   Yes [provider]  Alogliptin Benzoate 12.5 MG TABS Take 12.5 mg by mouth daily at 2 PM.  06/29/19  Yes [provider]  aspirin EC 81 MG tablet Take 81 mg by mouth every morning. (0930)   Yes [provider]  Calcium Carbonate-Vitamin D (CALCIUM 500 + D PO) Take 1 tablet by mouth daily before lunch. Citracal plus D3   Yes [provider]  candesartan (ATACAND) 16 MG tablet Take 1 tablet (16 mg total) by mouth  in the morning. And 8 mg (1/2 tablet) in the evening 12/19/19 12/18/20 Yes Satira Sark, MD  carvedilol (COREG) 25 MG tablet Take 1 tablet (25 mg total) by mouth in the morning and at bedtime. Breakfast & supper 11/28/19  Yes Satira Sark, MD  cholecalciferol (VITAMIN D3) 25 MCG (1000 UT) tablet Take 1,000 Units by mouth daily with breakfast.   Yes [provider]  CINNAMON PO Take 1,000 mg by mouth daily before lunch.    Yes [provider]  clopidogrel (PLAVIX) 75 MG tablet TAKE 1 TABLET BY MOUTH DAILY--STOP BRILLINTA. Patient taking  differently: Take 75 mg by mouth every morning. 10/05/19  Yes Satira Sark, MD  diazepam (VALIUM) 2 MG tablet Take 2 mg by mouth 2 (two) times daily as needed (dizzy spells.).   Yes [provider]  Doxepin HCl 3 MG TABS Take 3 mg by mouth at bedtime as needed (sleep.).  09/26/19  Yes [provider]  ferrous sulfate 325 (65 FE) MG tablet Take 325 mg by mouth daily before lunch.   Yes [provider]  furosemide (LASIX) 20 MG tablet Take 1 tablet (20 mg total) by mouth daily. 03/10/19 10/05/20 Yes Strader, Fransisco Hertz, PA-C  isosorbide mononitrate (IMDUR) 30 MG 24 hr tablet TAKE 1 TABLET EVERY EVENING. Patient taking differently: Take 30 mg by mouth daily. 12/09/19  Yes Satira Sark, MD  loperamide (IMODIUM) 2 MG capsule Take 2-4 mg by mouth 4 (four) times daily as needed for diarrhea or loose stools.   Yes [provider]  meclizine (ANTIVERT) 25 MG tablet Take 25 mg by mouth 2 (two) times daily as needed for dizziness.   Yes [provider]  Multiple Vitamin (MULTIVITAMIN WITH MINERALS) TABS tablet Take 1 tablet by mouth daily. Centrum Silver for Women   Yes [provider]  multivitamin-lutein (OCUVITE-LUTEIN) CAPS capsule Take 1 capsule by mouth daily before lunch.    Yes [provider]  nitroGLYCERIN (NITROSTAT) 0.4 MG SL tablet Place 1 tablet (0.4 mg total) under the tongue every 5 (five) minutes x 3 doses as needed for chest pain. 02/02/19  Yes Cheryln Manly, NP  Omega-3 Fatty Acids (FISH OIL PO) Take 1,576 mg by mouth daily before lunch.   Yes [provider]  pantoprazole (PROTONIX) 40 MG tablet Take 1 tablet (40 mg total) by mouth daily. Take 30 minutes before breakfast 01/12/20  Yes Annitta Needs, NP  saccharomyces boulardii (FLORASTOR) 250 MG capsule Take 250 mg by mouth 2 (two) times daily as needed (diarrhea).    Yes [provider]  sodium chloride (OCEAN) 0.65 % SOLN nasal spray Place 1 spray into  both nostrils 2 (two) times daily as needed for congestion.    Yes [provider]  spironolactone (ALDACTONE) 25 MG tablet TAKE (1/2) TABLET BY MOUTH DAILY. Patient taking differently: Take 12.5 mg by mouth daily. 09/28/19  Yes Satira Sark, MD     Allergies:    Allergies  Allergen Reactions  . Bactrim [Sulfamethoxazole-Trimethoprim] Nausea And Vomiting  . Sulfa Antibiotics Nausea And Vomiting  . Amlodipine Other (See Comments)    EDEMA  . Clonidine Derivatives Other (See Comments)    FATIGUE  . Evista [Raloxifene Hcl] Other (See Comments)    GERD  . Fosamax [Alendronate Sodium] Other (See Comments)    INCREASED GERD   . Glipizide Other (See Comments)    PALPITATIONS  . Lipitor [Atorvastatin Calcium] Other (See Comments)  ACHING  . Losartan Other (See Comments)    FATIGUE   . Pravastatin Other (See Comments)    ACHING  . Azithromycin Rash and Other (See Comments)    FACIAL BURNING     Social History:   Social History   Socioeconomic History  . Marital status: Divorced    Spouse name: Not on file  . Number of children: Not on file  . Years of education: Not on file  . Highest education level: Not on file  Occupational History  . Not on file  Tobacco Use  . Smoking status: Never Smoker  . Smokeless tobacco: Never Used  Vaping Use  . Vaping Use: Never used  Substance and Sexual Activity  . Alcohol use: Never  . Drug use: Never  . Sexual activity: Not on file  Other Topics Concern  . Not on file  Social History Narrative  . Not on file   Social Determinants of Health   Financial Resource Strain: Not on file  Food Insecurity: Not on file  Transportation Needs: Not on file  Physical Activity: Not on file  Stress: Not on file  Social Connections: Not on file  Intimate Partner Violence: Not on file    Family History:   The patient's family history includes Aneurysm in her sister; Cancer in her sister; Heart attack in her father, maternal  uncle, maternal uncle, maternal uncle, paternal aunt, paternal aunt, paternal grandfather, and sister; Heart disease in her maternal uncle, maternal uncle, maternal uncle, paternal aunt, paternal aunt, paternal grandfather, and sister; Stroke in her mother. There is no history of Colon cancer or Colon polyps.    ROS:  Please see the history of present illness.  All other ROS reviewed and negative.     Physical Exam/Data:   Vitals:   01/16/20 0815 01/16/20 0830 01/16/20 0900 01/16/20 0915  BP: (!) 188/69 (!) 181/59 (!) 182/66 (!) 188/53  Pulse: 70 70 71 73  Resp: 15 15 12 14   Temp:      TempSrc:      SpO2: 97% 95% 96% 98%  Weight:      Height:        Intake/Output Summary (Last 24 hours) at 01/16/2020 0921 Last data filed at 01/16/2020 0723 Gross per 24 hour  Intake --  Output 300 ml  Net -300 ml   Last 3 Weights 01/16/2020 01/12/2020 12/19/2019  Weight (lbs) 130 lb 135 lb 137 lb  Weight (kg) 58.968 kg 61.236 kg 62.143 kg     Body mass index is 21.63 kg/m.  General:  Well nourished, well developed, in no acute distress HEENT: normal Lymph: no adenopathy Neck: no JVD Endocrine:  No thryomegaly Vascular: No carotid bruits; FA pulses 2+ bilaterally without bruits  Cardiac:  normal S1, S2; RRR; no murmur  Lungs:  clear to auscultation bilaterally, no wheezing, rhonchi or rales  Abd: soft, nontender, no hepatomegaly  Ext: no edema Musculoskeletal:  No deformities, BUE and BLE strength normal and equal Skin: warm and dry  Neuro:  CNs 2-12 intact, no focal abnormalities noted Psych:  Normal affect     Laboratory Data:  High Sensitivity Troponin:   Recent Labs  Lab 01/16/20 0153 01/16/20 0510 01/16/20 0804  TROPONINIHS 842* 741* 675*      Chemistry Recent Labs  Lab 01/16/20 0153  NA 126*  K 4.4  CL 95*  CO2 23  GLUCOSE 108*  BUN 38*  CREATININE 1.66*  CALCIUM 9.2  GFRNONAA 30*  ANIONGAP 8  Recent Labs  Lab 01/16/20 0153  PROT 6.9  ALBUMIN 4.1   AST 24  ALT 14  ALKPHOS 45  BILITOT 0.5   Hematology Recent Labs  Lab 01/16/20 0153  WBC 9.0  RBC 3.95  HGB 11.2*  HCT 33.0*  MCV 83.5  MCH 28.4  MCHC 33.9  RDW 13.9  PLT 318   BNPNo results for input(s): BNP, PROBNP in the last 168 hours.  DDimer No results for input(s): DDIMER in the last 168 hours.   Radiology/Studies:  DG Chest Port 1 View  Result Date: 01/16/2020 CLINICAL DATA:  Bilateral shoulder pain, left greater than right, radiating to the neck and left arm, chest pain began yesterday EXAM: PORTABLE CHEST 1 VIEW COMPARISON:  Radiograph 02/28/2019 FINDINGS: Chronically coarsened interstitial and bronchitic changes are present throughout both lungs. No focal consolidation. No convincing features of edema. Portion of the left costophrenic sulcus is collimated. No visible pneumothorax or pleural effusion. Coronary stent projects over the level a cardiac silhouette. The aorta is calcified. The remaining cardiomediastinal contours are unremarkable. No acute osseous or soft tissue abnormality. Degenerative changes are present in the imaged spine and shoulders. Surgical clips in the right axilla. Telemetry leads overlie the chest. IMPRESSION: No acute cardiopulmonary abnormality. Chronically coarsened interstitial and bronchitic changes. Prior coronary stenting. Aortic Atherosclerosis (ICD10-I70.0). Electronically Signed   By: Lovena Le M.D.   On: 01/16/2020 02:40     Assessment and Plan:  1. CAD/NSTEMI - history of STEMI Jan 2021, received DES to LAD. At that time RCA 60% nonobstructive - presents with chest pain, initial trop 842 trending down, EKG new inferior TWIs - medical therapy with ASA, plavix(on since Jan 2021 MI), candesartan, coreg. She is statin intolerant. Started on nitro gtt in ER.  - will transfer to [facility] for cath today - repeat echo.     2. Hyperlipidemia - has not tolerated crestor, atorva, zetia. She turned down [id number]   3. Hyponatremia -  somewhat chronic, Na usually high 120s to low 130s over last several months - admit sodium 126, would hold her home lasix. Aldactone could also be playing a role but would continue for now           TIMI Risk Score for Unstable Angina or Non-ST Elevation MI:   The patient's TIMI risk score is 6, which indicates a 41% risk of all cause mortality, new or recurrent myocardial infarction or need for urgent revascularization in the next 14 days.      Severity of Illness: The appropriate patient status for this patient is INPATIENT. Inpatient status is judged to be reasonable and necessary in order to provide the required intensity of service to ensure the patient's safety. The patient's presenting symptoms, physical exam findings, and initial radiographic and laboratory data in the context of their chronic comorbidities is felt to place them at high risk for further clinical deterioration. Furthermore, it is not anticipated that the patient will be medically stable for discharge from the hospital within 2 midnights of admission. The following factors support the patient status of inpatient.   " The patient's presenting symptoms include chest pain. " The worrisome physical exam findings include . " The initial radiographic and laboratory data are worrisome because of elevated troponin, EKG TWIs. " The chronic co-morbidities include CAD.   * I certify that at the point of admission it is my clinical judgment that the patient will require inpatient hospital care spanning beyond 2 midnights from the point of  admission due to high intensity of service, high risk for further deterioration and high frequency of surveillance required.*    For questions or updates, please contact [facility] Please consult [email or web address] for contact info under     Signed, Carlyle Dolly, MD  01/16/2020 9:21 AM

## 2020-01-17 ENCOUNTER — Other Ambulatory Visit: Payer: Self-pay

## 2020-01-17 ENCOUNTER — Inpatient Hospital Stay (HOSPITAL_COMMUNITY): Payer: Medicare Other

## 2020-01-17 ENCOUNTER — Ambulatory Visit (HOSPITAL_COMMUNITY): Payer: Medicare Other

## 2020-01-17 ENCOUNTER — Encounter (HOSPITAL_COMMUNITY): Payer: Self-pay | Admitting: Interventional Cardiology

## 2020-01-17 DIAGNOSIS — I214 Non-ST elevation (NSTEMI) myocardial infarction: Principal | ICD-10-CM

## 2020-01-17 DIAGNOSIS — E782 Mixed hyperlipidemia: Secondary | ICD-10-CM

## 2020-01-17 DIAGNOSIS — I10 Essential (primary) hypertension: Secondary | ICD-10-CM

## 2020-01-17 LAB — ECHOCARDIOGRAM COMPLETE
AR max vel: 1.7 cm2
AV Area VTI: 1.94 cm2
AV Area mean vel: 1.62 cm2
AV Mean grad: 5 mmHg
AV Peak grad: 8.5 mmHg
Ao pk vel: 1.46 m/s
Area-P 1/2: 2.33 cm2
Height: 65 in
P 1/2 time: 446 msec
S' Lateral: 3 cm
Weight: 2080 oz

## 2020-01-17 LAB — CBC
HCT: 28.5 % — ABNORMAL LOW (ref 36.0–46.0)
Hemoglobin: 9.9 g/dL — ABNORMAL LOW (ref 12.0–15.0)
MCH: 28.6 pg (ref 26.0–34.0)
MCHC: 34.7 g/dL (ref 30.0–36.0)
MCV: 82.4 fL (ref 80.0–100.0)
Platelets: 282 10*3/uL (ref 150–400)
RBC: 3.46 MIL/uL — ABNORMAL LOW (ref 3.87–5.11)
RDW: 14.3 % (ref 11.5–15.5)
WBC: 9.8 10*3/uL (ref 4.0–10.5)
nRBC: 0 % (ref 0.0–0.2)

## 2020-01-17 LAB — BASIC METABOLIC PANEL
Anion gap: 12 (ref 5–15)
BUN: 33 mg/dL — ABNORMAL HIGH (ref 8–23)
CO2: 18 mmol/L — ABNORMAL LOW (ref 22–32)
Calcium: 8.6 mg/dL — ABNORMAL LOW (ref 8.9–10.3)
Chloride: 101 mmol/L (ref 98–111)
Creatinine, Ser: 1.48 mg/dL — ABNORMAL HIGH (ref 0.44–1.00)
GFR, Estimated: 35 mL/min — ABNORMAL LOW (ref >60)
Glucose, Bld: 95 mg/dL (ref 70–99)
Potassium: 4.2 mmol/L (ref 3.5–5.1)
Sodium: 131 mmol/L — ABNORMAL LOW (ref 135–145)

## 2020-01-17 LAB — GLUCOSE, CAPILLARY
Glucose-Capillary: 92 mg/dL (ref 70–99)
Glucose-Capillary: 110 mg/dL — ABNORMAL HIGH (ref 70–99)
Glucose-Capillary: 117 mg/dL — ABNORMAL HIGH (ref 70–99)
Glucose-Capillary: 112 mg/dL — ABNORMAL HIGH (ref 70–99)

## 2020-01-17 LAB — TRIGLYCERIDES: Triglycerides: 156 mg/dL — ABNORMAL HIGH (ref <150)

## 2020-01-17 MED ORDER — SPIRONOLACTONE 12.5 MG HALF TABLET
12.5000 mg | ORAL_TABLET | Freq: Every day | ORAL | Status: DC
  Administered 2020-01-17 – 2020-01-18 (×2): 12.5 mg via ORAL
  Filled 2020-01-17 (×2): qty 1

## 2020-01-17 MED ORDER — ISOSORBIDE MONONITRATE ER 30 MG PO TB24
30.0000 mg | ORAL_TABLET | Freq: Every day | ORAL | Status: DC
  Administered 2020-01-17 – 2020-01-19 (×3): 30 mg via ORAL
  Filled 2020-01-17 (×3): qty 1

## 2020-01-17 NOTE — Progress Notes (Signed)
  Echocardiogram 2D Echocardiogram has been performed.  Crystal [name] 01/17/2020, 2:53 PM

## 2020-01-17 NOTE — Progress Notes (Signed)
CARDIAC REHAB PHASE I   PRE:  Rate/Rhythm: 68 SR  BP:  Supine: 110/43, 133/62  Sitting:   Standing:    SaO2: 99%RA  MODE:  Ambulation: 172 ft   POST:  Rate/Rhythm: 93 SR  BP:  Supine:   Sitting: 105/43  Standing:    SaO2: 99%RA 1045-1132 Pt walked 172 ft with rolling walker and minimal asst. No CP or dizziness. Tolerated well. MI education completed with pt who voiced understanding. She remembered a lot of ed from January so just needed review. Reviewed MI restrictions, NTG use, importance of plavix with stent, watching sodium and CRP 2. Will send referral back to [facility]. Pt stated unable to do last admission due to taking lasix in morning and needing to stay close to home.    Graylon Good, RN BSN  01/17/2020 11:28 AM

## 2020-01-17 NOTE — Progress Notes (Signed)
Progress Note  Patient Name: Crystal [name] Date of Encounter: 01/17/2020  [facility] Cardiologist: Rozann Lesches, MD   Subjective   Postop day 1 inferior non-STEMI.  No chest pain or shortness of breath this morning  Inpatient Medications    Scheduled Meds: . Alogliptin Benzoate  12.5 mg Oral Q1400  . aspirin EC  81 mg Oral q morning - 10a  . carvedilol  25 mg Oral BID WC  . Chlorhexidine Gluconate Cloth  6 each Topical Daily  . clopidogrel  75 mg Oral q morning - 10a  . ferrous sulfate  325 mg Oral QAC lunch  . heparin injection (subcutaneous)  5,000 Units Subcutaneous Q8H  . insulin aspart  0-15 Units Subcutaneous TID WC  . irbesartan  150 mg Oral Daily  . multivitamin with minerals  1 tablet Oral Daily  . omega-3 acid ethyl esters  1 g Oral Daily  . pantoprazole  40 mg Oral Daily  . sodium chloride flush  3 mL Intravenous Q12H   Continuous Infusions: . sodium chloride    . sodium chloride    . nitroGLYCERIN Stopped (01/16/20 1956)   PRN Meds: sodium chloride, acetaminophen, diazepam, doxepin, HYDROmorphone (DILAUDID) injection, loperamide, nitroGLYCERIN, ondansetron (ZOFRAN) IV, sodium chloride flush   Vital Signs    Vitals:   01/17/20 0200 01/17/20 0215 01/17/20 0300 01/17/20 0400  BP: (!) 133/48 (!) 139/48 (!) 158/52 (!) 156/56  Pulse: 73 70 71 70  Resp: 13 15 15 14   Temp:    98.1 F (36.7 C)  TempSrc:    Oral  SpO2: 99% 98% 99% 100%  Weight:      Height:        Intake/Output Summary (Last 24 hours) at 01/17/2020 0828 Last data filed at 01/17/2020 0500 Gross per 24 hour  Intake 901.71 ml  Output 475 ml  Net 426.71 ml   Last 3 Weights 01/16/2020 01/12/2020 12/19/2019  Weight (lbs) 130 lb 135 lb 137 lb  Weight (kg) 58.968 kg 61.236 kg 62.143 kg      Telemetry    Normal sinus rhythm- Personally Reviewed  ECG    Normal sinus rhythm at 75 with biphasic inferior T waves and early R wave transition.- Personally Reviewed  Physical  Exam   GEN: No acute distress.   Neck: No JVD Cardiac: RRR, no murmurs, rubs, or gallops.  Respiratory: Clear to auscultation bilaterally. GI: Soft, nontender, non-distended  MS: No edema; No deformity. Neuro:  Nonfocal  Psych: Normal affect   Labs    High Sensitivity Troponin:   Recent Labs  Lab 01/16/20 0153 01/16/20 0510 01/16/20 0804 01/16/20 1003  TROPONINIHS 842* 741* 675* 614*      Chemistry Recent Labs  Lab 01/16/20 0153 01/17/20 0550  NA 126* 131*  K 4.4 4.2  CL 95* 101  CO2 23 18*  GLUCOSE 108* 95  BUN 38* 33*  CREATININE 1.66* 1.48*  CALCIUM 9.2 8.6*  PROT 6.9  --   ALBUMIN 4.1  --   AST 24  --   ALT 14  --   ALKPHOS 45  --   BILITOT 0.5  --   GFRNONAA 30* 35*  ANIONGAP 8 12     Hematology Recent Labs  Lab 01/16/20 0153 01/17/20 0550  WBC 9.0 9.8  RBC 3.95 3.46*  HGB 11.2* 9.9*  HCT 33.0* 28.5*  MCV 83.5 82.4  MCH 28.4 28.6  MCHC 33.9 34.7  RDW 13.9 14.3  PLT 318 282  BNPNo results for input(s): BNP, PROBNP in the last 168 hours.   DDimer No results for input(s): DDIMER in the last 168 hours.   Radiology    CARDIAC CATHETERIZATION  Result Date: 01/16/2020  Non-stenotic Mid LAD lesion was previously treated.  Prox Cx to Mid Cx lesion is 20% stenosed.  Dist RCA lesion is 20% stenosed.  Previously placed Mid RCA drug eluting stent is widely patent.  Unclear why she had ST elevations.  No large branch vessel jailed or occluded.  I suspect this was from microvascular disease related to her transient no reflow from the first cath.  Will give additional clopidogrel.  She did not tolerate Brilinta in the past.  I did not use GPIIb/IIIa due to bleeding risk.   CARDIAC CATHETERIZATION  Result Date: 01/16/2020  Previously placed Mid LAD drug eluting stent is widely patent.  Prox Cx to Mid Cx lesion is 20% stenosed.  Mid RCA lesion is 99% stenosed.  A drug-eluting stent was successfully placed using a STENT RESOLUTE ONYX 3.5X38,  postdilated to > 4 mm and optimized with IVUS. Transient no reflow when the stent was deployed, which resolved with IC verapamil.  Post intervention, there is a 0% residual stenosis.  Dist RCA lesion is 20% stenosed.  LV end diastolic pressure is low.  There is no aortic valve stenosis.  Continue aggressive secondary prevention.  DAPT for 12 months.  Watch overnight.    DG Chest Port 1 View  Result Date: 01/16/2020 CLINICAL DATA:  Bilateral shoulder pain, left greater than right, radiating to the neck and left arm, chest pain began yesterday EXAM: PORTABLE CHEST 1 VIEW COMPARISON:  Radiograph 02/28/2019 FINDINGS: Chronically coarsened interstitial and bronchitic changes are present throughout both lungs. No focal consolidation. No convincing features of edema. Portion of the left costophrenic sulcus is collimated. No visible pneumothorax or pleural effusion. Coronary stent projects over the level a cardiac silhouette. The aorta is calcified. The remaining cardiomediastinal contours are unremarkable. No acute osseous or soft tissue abnormality. Degenerative changes are present in the imaged spine and shoulders. Surgical clips in the right axilla. Telemetry leads overlie the chest. IMPRESSION: No acute cardiopulmonary abnormality. Chronically coarsened interstitial and bronchitic changes. Prior coronary stenting. Aortic Atherosclerosis (ICD10-I70.0). Electronically Signed   By: Lovena Le M.D.   On: 01/16/2020 02:40   ECHOCARDIOGRAM COMPLETE  Result Date: 01/16/2020    ECHOCARDIOGRAM REPORT   Patient Name:   Crystal [name] Date of Exam: 01/16/2020 Medical Rec #:  [id number]          Height:       65.0 in Accession #:    [id number]         Weight:       130.0 lb Date of Birth:  [birth year]          BSA:          1.647 m Patient Age:    14 years           BP:           188/53 mmHg Patient Gender: F                  HR:           73 bpm. Exam Location:  [facility] Procedure: 2D Echo Indications:     Chest Pain R07.9  History:        Patient has prior history of Echocardiogram examinations, most  recent 06/13/2019. CHF, Previous Myocardial Infarction and CAD;                 Risk Factors:Non-Smoker, Dyslipidemia, Diabetes and                 Hypertension.  Sonographer:    Leavy Cella RDCS (AE) Referring Phys: [id number] [place]  1. Left ventricular ejection fraction, by estimation, is 50 to 55%. The left ventricle has low normal function. The left ventricle has no regional wall motion abnormalities. There is mild left ventricular hypertrophy. Left ventricular diastolic parameters are consistent with Grade I diastolic dysfunction (impaired relaxation). Elevated left atrial pressure.  2. Right ventricular systolic function is normal. The right ventricular size is normal.  3. Left atrial size was mildly dilated.  4. The mitral valve is normal in structure. Mild mitral valve regurgitation. No evidence of mitral stenosis.  5. The aortic valve has an indeterminant number of cusps. Aortic valve regurgitation is mild to moderate. No aortic stenosis is present.  6. The inferior vena cava is normal in size with greater than 50% respiratory variability, suggesting right atrial pressure of 3 mmHg. FINDINGS  Left Ventricle: Left ventricular ejection fraction, by estimation, is 50 to 55%. The left ventricle has low normal function. The left ventricle has no regional wall motion abnormalities. The left ventricular internal cavity size was normal in size. There is mild left ventricular hypertrophy. Left ventricular diastolic parameters are consistent with Grade I diastolic dysfunction (impaired relaxation). Elevated left atrial pressure. Right Ventricle: The right ventricular size is normal. No increase in right ventricular wall thickness. Right ventricular systolic function is normal. Left Atrium: Left atrial size was mildly dilated. Right Atrium: Right atrial size was normal in size.  Pericardium: There is no evidence of pericardial effusion. Mitral Valve: The mitral valve is normal in structure. There is mild thickening of the mitral valve leaflet(s). There is mild calcification of the mitral valve leaflet(s). Mild mitral annular calcification. Mild mitral valve regurgitation. No evidence of  mitral valve stenosis. Tricuspid Valve: The tricuspid valve is normal in structure. Tricuspid valve regurgitation is trivial. No evidence of tricuspid stenosis. Aortic Valve: The aortic valve has an indeterminant number of cusps. Aortic valve regurgitation is mild to moderate. Aortic regurgitation PHT measures 356 msec. No aortic stenosis is present. Aortic valve mean gradient measures 4.0 mmHg. Aortic valve peak gradient measures 7.0 mmHg. Aortic valve area, by VTI measures 1.44 cm. Pulmonic Valve: The pulmonic valve was not well visualized. Pulmonic valve regurgitation is not visualized. No evidence of pulmonic stenosis. Aorta: The aortic root is normal in size and structure. Pulmonary Artery: Indeterminant PASP, inadequate TR jet. Venous: The inferior vena cava is normal in size with greater than 50% respiratory variability, suggesting right atrial pressure of 3 mmHg. IAS/Shunts: No atrial level shunt detected by color flow Doppler.  LEFT VENTRICLE PLAX 2D LVIDd:         4.13 cm  Diastology LVIDs:         3.04 cm  LV e' medial:    0.03 cm/s LV PW:         1.21 cm  LV E/e' medial:  23.9 LV IVS:        1.12 cm  LV e' lateral:   0.04 cm/s LVOT diam:     1.80 cm  LV E/e' lateral: 19.9 LV SV:         43 LV SV Index:   26 LVOT Area:  2.54 cm  RIGHT VENTRICLE RV S prime:     13.20 cm/s LEFT ATRIUM             Index       RIGHT ATRIUM           Index LA diam:        3.70 cm 2.25 cm/m  RA Area:     13.80 cm LA Vol (A2C):   58.3 ml 35.39 ml/m RA Volume:   31.40 ml  19.06 ml/m LA Vol (A4C):   52.6 ml 31.93 ml/m LA Biplane Vol: 58.3 ml 35.39 ml/m  AORTIC VALVE AV Area (Vmax):    1.46 cm AV Area (Vmean):    1.21 cm AV Area (VTI):     1.44 cm AV Vmax:           131.87 cm/s AV Vmean:          95.387 cm/s AV VTI:            0.302 m AV Peak Grad:      7.0 mmHg AV Mean Grad:      4.0 mmHg LVOT Vmax:         75.67 cm/s LVOT Vmean:        45.366 cm/s LVOT VTI:          0.171 m LVOT/AV VTI ratio: 0.57 AI PHT:            356 msec  AORTA Ao Root diam: 2.80 cm MITRAL VALVE                TRICUSPID VALVE MV Area (PHT): 4.10 cm     TR Peak grad:   19.7 mmHg MV Decel Time: 185 msec     TR Vmax:        222.00 cm/s MV E velocity: 0.79 cm/s MV A velocity: 147.00 cm/s  SHUNTS MV E/A ratio:  0.01         Systemic VTI:  0.17 m                             Systemic Diam: 1.80 cm Carlyle Dolly MD Electronically signed by Carlyle Dolly MD Signature Date/Time: 01/16/2020/10:19:54 AM    Final     Cardiac Studies   2D echo (01/16/2020)  IMPRESSIONS    1. Left ventricular ejection fraction, by estimation, is 50 to 55%. The  left ventricle has low normal function. The left ventricle has no regional  wall motion abnormalities. There is mild left ventricular hypertrophy.  Left ventricular diastolic  parameters are consistent with Grade I diastolic dysfunction (impaired  relaxation). Elevated left atrial pressure.  2. Right ventricular systolic function is normal. The right ventricular  size is normal.  3. Left atrial size was mildly dilated.  4. The mitral valve is normal in structure. Mild mitral valve  regurgitation. No evidence of mitral stenosis.  5. The aortic valve has an indeterminant number of cusps. Aortic valve  regurgitation is mild to moderate. No aortic stenosis is present.  6. The inferior vena cava is normal in size with greater than 50%  respiratory variability, suggesting right atrial pressure of 3 mmHg.   Cardiac catheterization (01/16/2020)  Conclusion    Non-stenotic Mid LAD lesion was previously treated.  Prox Cx to Mid Cx lesion is 20% stenosed.  Dist RCA lesion is 20%  stenosed.  Previously placed Mid RCA drug eluting stent is widely patent.   Unclear  why she had ST elevations.  No large branch vessel jailed or occluded.  I suspect this was from microvascular disease related to her transient no reflow from the first cath.  Will give additional clopidogrel.  She did not tolerate Brilinta in the past.  I did not use GPIIb/IIIa due to bleeding risk.  Coronary Diagrams   Diagnostic Dominance: Right    Intervention    Patient Profile     KATLYNN NASER is a 85 y.o. female with history of STEMI Jan 2021 with DES to LAD presents with chest pain.  She has a history of treated hypertension hyperlipidemia.  She presented to [facility] with chest pain yesterday and EKG changes with positive enzymes.  She was transferred urgently for cardiac catheterization performed by Dr. Irish Lack.  Assessment & Plan    1: CAD-history of CAD status post anterior STEMI treated with PCI and stenting January 2021 with otherwise nonobstructive CAD after that.  She presented with chest pain to [facility] yesterday after awakening with pain at night.  She had inferior ST segment changes and positive enzymes.  She was transferred for urgent cath which was performed radially by Dr. Irish Lack revealing totally occluded RCA which underwent stenting using a 3.5 x 38 mm stent, postdilated with a 4 mm balloon, IVUS guided.  She had "no reflow" treated with IC verapamil resulting in improvement in blood flow.  The previously placed LAD was stent was widely patent.  She went back to the lab because of recurrent chest pain and EKG changes revealing a widely patent stent.  She is allergic to Brilinta and has been on aspirin and Plavix at home which will be continued.  2: Hyperlipidemia-intolerant to statin therapy.  PCSK9 has been discussed in the past.  3: Essential hypertension-on spironolactone and carvedilol at home.  Blood pressure today is 160/50.  Currently on Coreg and  irbesartan (on candesartan at home).  We will closely monitor blood pressure in the hospital.  Postop day 1 inferior STEMI treated PCI drug-eluting stenting.  Her peak troponin was 750.  She is asymptomatic this morning.  Her right groin minx closure site is stable.  Will transfer to telemetry today, ambulate and follow vital signs.  Potential discharge tomorrow.  For questions or updates, please contact [facility] Please consult [email or web address] for contact info under        Signed, Quay Burow, MD  01/17/2020, 8:28 AM

## 2020-01-18 LAB — GLUCOSE, CAPILLARY
Glucose-Capillary: 195 mg/dL — ABNORMAL HIGH (ref 70–99)
Glucose-Capillary: 63 mg/dL — ABNORMAL LOW (ref 70–99)
Glucose-Capillary: 81 mg/dL (ref 70–99)
Glucose-Capillary: 114 mg/dL — ABNORMAL HIGH (ref 70–99)
Glucose-Capillary: 120 mg/dL — ABNORMAL HIGH (ref 70–99)

## 2020-01-18 LAB — BASIC METABOLIC PANEL
Anion gap: 10 (ref 5–15)
BUN: 34 mg/dL — ABNORMAL HIGH (ref 8–23)
CO2: 19 mmol/L — ABNORMAL LOW (ref 22–32)
Calcium: 8.7 mg/dL — ABNORMAL LOW (ref 8.9–10.3)
Chloride: 102 mmol/L (ref 98–111)
Creatinine, Ser: 1.93 mg/dL — ABNORMAL HIGH (ref 0.44–1.00)
GFR, Estimated: 25 mL/min — ABNORMAL LOW (ref >60)
Glucose, Bld: 106 mg/dL — ABNORMAL HIGH (ref 70–99)
Potassium: 4.3 mmol/L (ref 3.5–5.1)
Sodium: 131 mmol/L — ABNORMAL LOW (ref 135–145)

## 2020-01-18 MED ORDER — SODIUM CHLORIDE 0.9 % IV SOLN: INTRAVENOUS | Status: DC

## 2020-01-18 NOTE — Discharge Instructions (Signed)

## 2020-01-18 NOTE — Progress Notes (Signed)
CARDIAC REHAB PHASE I   PRE:  Rate/Rhythm: 92 SR  BP:  Supine:   Sitting: 150/66  Standing:    SaO2: 97%RA  MODE:  Ambulation: 200 ft   POST:  Rate/Rhythm: 98 SR  BP:  Supine:   Sitting: 151/49  Standing:    SaO2: 97%RA 0843-0910 Pt walked 200 ft on RA with hand held asst. No CP. Tolerated well. Walking as tolerated guidelines given for exercise. Other education completed yesterday.   Graylon Good, RN BSN  01/18/2020 9:06 AM

## 2020-01-18 NOTE — Plan of Care (Signed)
  Problem: Cardiovascular: Goal: Ability to achieve and maintain adequate cardiovascular perfusion will improve Outcome: Progressing Goal: Vascular access site(s) Level 0-1 will be maintained Outcome: Progressing   Problem: Activity: Goal: Ability to return to baseline activity level will improve Outcome: Progressing   Problem: Education: Goal: Understanding of CV disease, CV risk reduction, and recovery process will improve Outcome: Progressing Goal: Individualized Educational Video(s) Outcome: Progressing

## 2020-01-18 NOTE — Progress Notes (Addendum)
Progress Note  Patient Name: Crystal [name] Date of Encounter: 01/18/2020  [facility] Cardiologist: Rozann Lesches, MD   Subjective   No chest pain or SOB feels well  POD 2 inf NSTEMI   Inpatient Medications    Scheduled Meds:  aspirin EC  81 mg Oral q morning - 10a   carvedilol  25 mg Oral BID WC   Chlorhexidine Gluconate Cloth  6 each Topical Daily   clopidogrel  75 mg Oral q morning - 10a   ferrous sulfate  325 mg Oral QAC lunch   heparin injection (subcutaneous)  5,000 Units Subcutaneous Q8H   insulin aspart  0-15 Units Subcutaneous TID WC   irbesartan  150 mg Oral Daily   isosorbide mononitrate  30 mg Oral Daily   multivitamin with minerals  1 tablet Oral Daily   omega-3 acid ethyl esters  1 g Oral Daily   pantoprazole  40 mg Oral Daily   sodium chloride flush  3 mL Intravenous Q12H   spironolactone  12.5 mg Oral Daily   Continuous Infusions:  sodium chloride     sodium chloride     sodium chloride 50 mL/hr at 01/18/20 1032   nitroGLYCERIN Stopped (01/16/20 1956)   PRN Meds: sodium chloride, acetaminophen, diazepam, doxepin, HYDROmorphone (DILAUDID) injection, loperamide, nitroGLYCERIN, ondansetron (ZOFRAN) IV, sodium chloride flush   Vital Signs    Vitals:   01/18/20 0000 01/18/20 0424 01/18/20 0810 01/18/20 0824  BP: (!) 139/49 (!) 140/43 (!) 92/54 (!) 134/43  Pulse: 79 69 81   Resp: 16 18 18    Temp: 98.3 F (36.8 C) 97.9 F (36.6 C) 98.2 F (36.8 C)   TempSrc: Oral Oral Oral   SpO2: 97% 98% 98%   Weight:  63.5 kg    Height:        Intake/Output Summary (Last 24 hours) at 01/18/2020 1152 Last data filed at 01/17/2020 1800 Gross per 24 hour  Intake 600 ml  Output --  Net 600 ml   Last 3 Weights 01/18/2020 01/16/2020 01/12/2020  Weight (lbs) 139 lb 15.9 oz 130 lb 135 lb  Weight (kg) 63.5 kg 58.968 kg 61.236 kg      Telemetry    Sr with PACs occ - Personally Reviewed  ECG    No new - Personally Reviewed  Physical Exam    GEN: No acute distress.   Neck: No JVD Cardiac: RRR, no murmurs, rubs, or gallops.  Respiratory: Clear to auscultation bilaterally. GI: Soft, nontender, non-distended  MS: No edema; No deformity. Neuro:  Nonfocal  Psych: Normal affect   Labs    High Sensitivity Troponin:   Recent Labs  Lab 01/16/20 0153 01/16/20 0510 01/16/20 0804 01/16/20 1003  TROPONINIHS 842* 741* 675* 614*      Chemistry Recent Labs  Lab 01/16/20 0153 01/17/20 0550 01/18/20 0233  NA 126* 131* 131*  K 4.4 4.2 4.3  CL 95* 101 102  CO2 23 18* 19*  GLUCOSE 108* 95 106*  BUN 38* 33* 34*  CREATININE 1.66* 1.48* 1.93*  CALCIUM 9.2 8.6* 8.7*  PROT 6.9  --   --   ALBUMIN 4.1  --   --   AST 24  --   --   ALT 14  --   --   ALKPHOS 45  --   --   BILITOT 0.5  --   --   GFRNONAA 30* 35* 25*  ANIONGAP 8 12 10      Hematology Recent Labs  Lab  01/16/20 0153 01/17/20 0550  WBC 9.0 9.8  RBC 3.95 3.46*  HGB 11.2* 9.9*  HCT 33.0* 28.5*  MCV 83.5 82.4  MCH 28.4 28.6  MCHC 33.9 34.7  RDW 13.9 14.3  PLT 318 282    BNPNo results for input(s): BNP, PROBNP in the last 168 hours.   DDimer No results for input(s): DDIMER in the last 168 hours.   Radiology    CARDIAC CATHETERIZATION  Result Date: 01/16/2020  Non-stenotic Mid LAD lesion was previously treated.  Prox Cx to Mid Cx lesion is 20% stenosed.  Dist RCA lesion is 20% stenosed.  Previously placed Mid RCA drug eluting stent is widely patent.  Unclear why she had ST elevations.  No large branch vessel jailed or occluded.  I suspect this was from microvascular disease related to her transient no reflow from the first cath.  Will give additional clopidogrel.  She did not tolerate Brilinta in the past.  I did not use GPIIb/IIIa due to bleeding risk.   CARDIAC CATHETERIZATION  Result Date: 01/16/2020  Previously placed Mid LAD drug eluting stent is widely patent.  Prox Cx to Mid Cx lesion is 20% stenosed.  Mid RCA lesion is 99% stenosed.   A drug-eluting stent was successfully placed using a STENT RESOLUTE ONYX 3.5X38, postdilated to > 4 mm and optimized with IVUS. Transient no reflow when the stent was deployed, which resolved with IC verapamil.  Post intervention, there is a 0% residual stenosis.  Dist RCA lesion is 20% stenosed.  LV end diastolic pressure is low.  There is no aortic valve stenosis.  Continue aggressive secondary prevention.  DAPT for 12 months.  Watch overnight.    ECHOCARDIOGRAM COMPLETE  Result Date: 01/17/2020    ECHOCARDIOGRAM REPORT   Patient Name:   Crystal [name] Date of Exam: 01/17/2020 Medical Rec #:  [id number]          Height:       65.0 in Accession #:    [id number]         Weight:       130.0 lb Date of Birth:  1934-11-11          BSA:          1.647 m Patient Age:    85 years           BP:           133/62 mmHg Patient Gender: F                  HR:           66 bpm. Exam Location:  Inpatient Procedure: 2D Echo, Cardiac Doppler and Color Doppler Indications:    NSTEMI  History:        Patient has prior history of Echocardiogram examinations, most                 recent 01/16/2020. CHF, Acute MI and CAD; Risk Factors:Diabetes,                 Dyslipidemia and Hypertension.  Sonographer:    Clayton Lefort RDCS (AE) Referring Phys: [place]  1. Left ventricular ejection fraction, by estimation, is 50 to 55%. The left ventricle has low normal function. The left ventricle demonstrates regional wall motion abnormalities (see scoring diagram/findings for description). There is moderate left ventricular hypertrophy. Left ventricular diastolic parameters are consistent with Grade I diastolic dysfunction (impaired relaxation). Elevated left ventricular end-diastolic pressure. The  E/e' is 55. There is moderate hypokinesis of the left ventricular, basal-mid inferior wall.  2. Right ventricular systolic function is hyperdynamic. The right ventricular size is normal. There is normal pulmonary  artery systolic pressure.  3. Left atrial size was moderately dilated.  4. The mitral valve is abnormal. Trivial mitral valve regurgitation.  5. The aortic valve is tricuspid. Aortic valve regurgitation is not visualized.  6. The inferior vena cava is normal in size with <50% respiratory variability, suggesting right atrial pressure of 8 mmHg. Comparison(s): No significant change from prior study. 01/16/20: LVEF 50-55%. FINDINGS  Left Ventricle: Left ventricular ejection fraction, by estimation, is 50 to 55%. The left ventricle has low normal function. The left ventricle demonstrates regional wall motion abnormalities. Moderate hypokinesis of the left ventricular, basal-mid inferior wall. The left ventricular internal cavity size was normal in size. There is moderate left ventricular hypertrophy. Left ventricular diastolic parameters are consistent with Grade I diastolic dysfunction (impaired relaxation). Elevated left ventricular end-diastolic pressure. The E/e' is 51.  LV Wall Scoring: The inferior wall is hypokinetic. Right Ventricle: The right ventricular size is normal. No increase in right ventricular wall thickness. Right ventricular systolic function is hyperdynamic. There is normal pulmonary artery systolic pressure. The tricuspid regurgitant velocity is 2.47 m/s, and with an assumed right atrial pressure of 8 mmHg, the estimated right ventricular systolic pressure is 65.7 mmHg. Left Atrium: Left atrial size was moderately dilated. Right Atrium: Right atrial size was normal in size. Pericardium: There is no evidence of pericardial effusion. Mitral Valve: The mitral valve is abnormal. There is moderate thickening of the mitral valve leaflet(s). Trivial mitral valve regurgitation. MV peak gradient, 12.2 mmHg. The mean mitral valve gradient is 3.0 mmHg. Tricuspid Valve: The tricuspid valve is grossly normal. Tricuspid valve regurgitation is trivial. Aortic Valve: The aortic valve is tricuspid. Aortic valve  regurgitation is not visualized. Aortic regurgitation PHT measures 446 msec. Aortic valve mean gradient measures 5.0 mmHg. Aortic valve peak gradient measures 8.5 mmHg. Aortic valve area, by VTI measures 1.94 cm. Pulmonic Valve: The pulmonic valve was normal in structure. Pulmonic valve regurgitation is not visualized. Aorta: The aortic root and ascending aorta are structurally normal, with no evidence of dilitation. Venous: The inferior vena cava is normal in size with less than 50% respiratory variability, suggesting right atrial pressure of 8 mmHg. IAS/Shunts: No atrial level shunt detected by color flow Doppler.  LEFT VENTRICLE PLAX 2D LVIDd:         3.70 cm  Diastology LVIDs:         3.00 cm  LV e' medial:    3.23 cm/s LV PW:         1.40 cm  LV E/e' medial:  35.0 LV IVS:        1.60 cm  LV e' lateral:   5.55 cm/s LVOT diam:     1.80 cm  LV E/e' lateral: 20.4 LV SV:         58 LV SV Index:   35 LVOT Area:     2.54 cm  RIGHT VENTRICLE             IVC RV Basal diam:  3.10 cm     IVC diam: 1.90 cm RV S prime:     12.90 cm/s TAPSE (M-mode): 2.4 cm LEFT ATRIUM           Index       RIGHT ATRIUM  Index LA diam:      3.40 cm 2.06 cm/m  RA Area:     13.30 cm LA Vol (A2C): 68.8 ml 41.77 ml/m RA Volume:   30.20 ml  18.33 ml/m LA Vol (A4C): 65.2 ml 39.58 ml/m  AORTIC VALVE AV Area (Vmax):    1.70 cm AV Area (Vmean):   1.62 cm AV Area (VTI):     1.94 cm AV Vmax:           146.00 cm/s AV Vmean:          101.000 cm/s AV VTI:            0.299 m AV Peak Grad:      8.5 mmHg AV Mean Grad:      5.0 mmHg LVOT Vmax:         97.30 cm/s LVOT Vmean:        64.300 cm/s LVOT VTI:          0.228 m LVOT/AV VTI ratio: 0.76 AI PHT:            446 msec  AORTA Ao Root diam: 3.00 cm Ao Asc diam:  3.00 cm MITRAL VALVE                TRICUSPID VALVE MV Area (PHT): 2.33 cm     TR Peak grad:   24.4 mmHg MV Peak grad:  12.2 mmHg    TR Vmax:        247.00 cm/s MV Mean grad:  3.0 mmHg MV Vmax:       1.75 m/s     SHUNTS MV Vmean:       82.1 cm/s    Systemic VTI:  0.23 m MV Decel Time: 325 msec     Systemic Diam: 1.80 cm MV E velocity: 113.00 cm/s MV A velocity: 164.00 cm/s MV E/A ratio:  0.69 Lyman Bishop MD Electronically signed by Lyman Bishop MD Signature Date/Time: 01/17/2020/4:47:59 PM    Final     Cardiac Studies   2D echo (01/16/2020)   IMPRESSIONS     1. Left ventricular ejection fraction, by estimation, is 50 to 55%. The  left ventricle has low normal function. The left ventricle has no regional  wall motion abnormalities. There is mild left ventricular hypertrophy.  Left ventricular diastolic  parameters are consistent with Grade I diastolic dysfunction (impaired  relaxation). Elevated left atrial pressure.   2. Right ventricular systolic function is normal. The right ventricular  size is normal.   3. Left atrial size was mildly dilated.   4. The mitral valve is normal in structure. Mild mitral valve  regurgitation. No evidence of mitral stenosis.   5. The aortic valve has an indeterminant number of cusps. Aortic valve  regurgitation is mild to moderate. No aortic stenosis is present.   6. The inferior vena cava is normal in size with greater than 50%  respiratory variability, suggesting right atrial pressure of 3 mmHg.     Cardiac cath 01/16/20   Previously placed Mid LAD drug eluting stent is widely patent. Prox Cx to Mid Cx lesion is 20% stenosed. Mid RCA lesion is 99% stenosed. A drug-eluting stent was successfully placed using a STENT RESOLUTE ONYX 3.5X38, postdilated to > 4 mm and optimized with IVUS. Transient no reflow when the stent was deployed, which resolved with IC verapamil. Post intervention, there is a 0% residual stenosis. Dist RCA lesion is 20% stenosed. LV end diastolic pressure is low. There is no aortic  valve stenosis.   Continue aggressive secondary prevention.  DAPT for 12 months.     Watch overnight.        Diagnostic Dominance:  Right    Intervention       REPEAT Cardiac catheterization (01/16/2020)   Conclusion     Non-stenotic Mid LAD lesion was previously treated. Prox Cx to Mid Cx lesion is 20% stenosed. Dist RCA lesion is 20% stenosed. Previously placed Mid RCA drug eluting stent is widely patent.   Unclear why she had ST elevations.  No large branch vessel jailed or occluded.  I suspect this was from microvascular disease related to her transient no reflow from the first cath.  Will give additional clopidogrel.  She did not tolerate Brilinta in the past.  I did not use GPIIb/IIIa due to bleeding risk.  Coronary Diagrams     Diagnostic Dominance: Right      Intervention  Patient Profile     86 y.o. female with history of STEMI Jan 2021 with DES to LAD presents with chest pain.  She has a history of treated hypertension hyperlipidemia.  [facility] to [facility] with chest pain yesterday and EKG changes with positive enzymes.  She was transferred urgently for cardiac catheterization performed by Dr. Irish Lack.    Assessment & Plan    1: CAD-history of CAD status post anterior STEMI treated with PCI and stenting January 2021 with otherwise nonobstructive CAD after that.  She presented with chest pain to [facility] yesterday after awakening with pain at night.  She had inferior ST segment changes and positive enzymes.  She was transferred for urgent cath which was performed radially by Dr. Irish Lack revealing totally occluded RCA which underwent stenting using a 3.5 x 38 mm stent, postdilated with a 4 mm balloon, IVUS guided.  She had "no reflow" treated with IC verapamil resulting in improvement in blood flow.  The previously placed LAD was stent was widely patent.  She went back to the lab because of recurrent chest pain and EKG changes revealing a widely patent stent.  She is allergic to Brilinta and has been on aspirin and Plavix at home which will be continued.   2:  Hyperlipidemia-intolerant to statin therapy.  PCSK9 has been discussed in the past.   3: Essential hypertension-on spironolactone and carvedilol at home.  Blood pressure today is 160/50.  Currently on Coreg and irbesartan (on candesartan at home).  We will closely monitor blood pressure in the hospital.  4. AKI with increase of Cr to 1.93 up from 1.48  - will give IV fluids 50 an hour for today and recheck BMP in AM.  Will stop aldactone. - will continue avapro for now.  Has rec'd both today    Postop day 2 inferior STEMI treated PCI drug-eluting stenting.  Her peak troponin was 750.  She is asymptomatic this morning.  Her right groin minx closure site is stable.  ambulate and follow vital signs.  give IV fluids for AKI.  Potential discharge tomorrow.    For questions or updates, please contact [place] Please consult [email or web address] for contact info under        Signed, Cecilie Kicks, NP  01/18/2020, 11:52 AM    Agree with note written by Cecilie Kicks RNP  Status post inferior STEMI treated with PCI and stenting by Dr. Irish Lack.  This was complicated by no reflow which resolved with intracoronary verapamil.  Because of recurrent chest pain and EKG changes she was  brought back to the Cath Lab for rate repeat angiography revealing widely patent stent.  LV function is normal.  She is on dual antiplatelet therapy.  Her serum creatinine did go up to 1.9 this morning.  Plan hydration today, recheck renal function in the morning.  If it is coming down she can be discharged home.  Her exam is benign.  She denies chest pain.  Quay Burow 01/18/2020 12:15 PM

## 2020-01-19 ENCOUNTER — Other Ambulatory Visit: Payer: Self-pay | Admitting: *Deleted

## 2020-01-19 ENCOUNTER — Telehealth: Payer: Self-pay | Admitting: *Deleted

## 2020-01-19 ENCOUNTER — Other Ambulatory Visit: Payer: Self-pay | Admitting: Cardiology

## 2020-01-19 DIAGNOSIS — Z79899 Other long term (current) drug therapy: Secondary | ICD-10-CM

## 2020-01-19 LAB — BASIC METABOLIC PANEL
Anion gap: 7 (ref 5–15)
BUN: 39 mg/dL — ABNORMAL HIGH (ref 8–23)
CO2: 19 mmol/L — ABNORMAL LOW (ref 22–32)
Calcium: 8.5 mg/dL — ABNORMAL LOW (ref 8.9–10.3)
Chloride: 104 mmol/L (ref 98–111)
Creatinine, Ser: 1.86 mg/dL — ABNORMAL HIGH (ref 0.44–1.00)
GFR, Estimated: 27 mL/min — ABNORMAL LOW (ref >60)
Glucose, Bld: 103 mg/dL — ABNORMAL HIGH (ref 70–99)
Potassium: 4.5 mmol/L (ref 3.5–5.1)
Sodium: 130 mmol/L — ABNORMAL LOW (ref 135–145)

## 2020-01-19 LAB — GLUCOSE, CAPILLARY: Glucose-Capillary: 158 mg/dL — ABNORMAL HIGH (ref 70–99)

## 2020-01-19 MED ORDER — HYDRALAZINE HCL 25 MG PO TABS
25.0000 mg | ORAL_TABLET | Freq: Two times a day (BID) | ORAL | Status: DC
  Administered 2020-01-19: 25 mg via ORAL
  Filled 2020-01-19: qty 1

## 2020-01-19 MED ORDER — HYDRALAZINE HCL 25 MG PO TABS: 25.0000 mg | ORAL_TABLET | Freq: Two times a day (BID) | ORAL | 3 refills | Status: DC

## 2020-01-19 MED ORDER — ISOSORBIDE MONONITRATE ER 30 MG PO TB24: 30.0000 mg | ORAL_TABLET | Freq: Every day | ORAL | Status: DC

## 2020-01-19 MED FILL — hydrALAZINE HCL 25 MG TABS: 25 | 90 days supply | Qty: 180 | Fill #0

## 2020-01-19 NOTE — Telephone Encounter (Signed)
Pt voiced understanding of d/c instructions from hospital - says she is feeling fine has someone at home with her - called early due to office being closed tomorrow - pt confirmed f/u for 12/30 and will call us back if she has questions before then

## 2020-01-19 NOTE — Progress Notes (Signed)
CARDIAC REHAB PHASE I   PRE:  Rate/Rhythm: 79 SR  BP:  Supine:   Sitting: 149/64  Standing:    SaO2: 97%RA  MODE:  Ambulation: 275 ft   POST:  Rate/Rhythm: 89 SR  BP:  Supine:   Sitting: 161/59  Standing:    SaO2: 99%RA [id number] Pt walked 275 ft on RA with steady gait. Denied CP or dizziness. Tolerated well.   Graylon Good, RN BSN  01/19/2020 8:30 AM

## 2020-01-19 NOTE — Telephone Encounter (Signed)
-----   Message from Desma Paganini sent at 01/19/2020  9:51 AM EST ----- TOC f/u w/ Jonni Sanger on 01/26/2020

## 2020-01-19 NOTE — Patient Outreach (Signed)
[facility]) Care Management  01/19/2020  Ciin Brazzel Castleman 1934-02-13 [id number]  Telephone outreach for follow up NSTEMI pt is still hospitalized so call was not made.  [name]. [name], MSN, [facility] Gerontological Nurse Practitioner [facility] Care Management [phone]

## 2020-01-19 NOTE — Discharge Summary (Addendum)
Discharge Summary    Patient ID: Crystal [name] MRN: [id number]; DOB: [birth year]  Admit date: 01/16/2020 Discharge date: 01/19/2020  Primary Care Provider: Glenda Chroman, MD  Primary Cardiologist: Rozann Lesches, MD  Primary Electrophysiologist:  None   Discharge Diagnoses    Principal Problem:   NSTEMI (non-ST elevated myocardial infarction) [facility]) Active Problems:   Diabetes mellitus ([place])   Hyperlipidemia   Hyperlipidemia LDL goal <70   Nonspecific abnormal electrocardiogram (ECG) (EKG)    Diagnostic Studies/Procedures    Cath: 01/16/20   Previously placed Mid LAD drug eluting stent is widely patent. Prox Cx to Mid Cx lesion is 20% stenosed. Mid RCA lesion is 99% stenosed. A drug-eluting stent was successfully placed using a STENT RESOLUTE ONYX 3.5X38, postdilated to > 4 mm and optimized with IVUS. Transient no reflow when the stent was deployed, which resolved with IC verapamil. Post intervention, there is a 0% residual stenosis. Dist RCA lesion is 20% stenosed. LV end diastolic pressure is low. There is no aortic valve stenosis.   Continue aggressive secondary prevention.  DAPT for 12 months.     Watch overnight.   Diagnostic Dominance: Right    Intervention     Echo: 01/17/20  IMPRESSIONS     1. Left ventricular ejection fraction, by estimation, is 50 to 55%. The  left ventricle has low normal function. The left ventricle has no regional  wall motion abnormalities. There is mild left ventricular hypertrophy.  Left ventricular diastolic  parameters are consistent with Grade I diastolic dysfunction (impaired  relaxation). Elevated left atrial pressure.   2. Right ventricular systolic function is normal. The right ventricular  size is normal.   3. Left atrial size was mildly dilated.   4. The mitral valve is normal in structure. Mild mitral valve  regurgitation. No evidence of mitral stenosis.   5. The aortic valve has an indeterminant number  of cusps. Aortic valve  regurgitation is mild to moderate. No aortic stenosis is present.   6. The inferior vena cava is normal in size with greater than 50%  respiratory variability, suggesting right atrial pressure of 3 mmHg.  _____________   History of Present Illness     Crystal [name] is a 85 y.o. female with history of CAD with prior STEMI in Jan 2021 with DES to mid LAD, chronic systolic HF prior LVEF 13-24% most recent 45-50%, HTN, CKD3 presents with chest pain. Intermittent SOB and fatigue over the last week. Yesterday began having left chest and shoulder pain, sharp pain also radiating down left arm. Better with NG.      Labs on admission-K 4.4 Cr 1.66 Na 126 WBC 9 Hgb 11.2 Plt 318 INR 1.1 hstrop 843-->741 COVID neg CXR no acute process EKG SR, new inferior TWI  She was seen by Dr. Harl Bowie at [facility] ED and transferred to [facility] for further management with plans for cardiac cath.   Hospital Course     1. NSTEMI: history of CAD. She presented with chest pain to [facility] after awakening with pain at night.  She had inferior ST segment changes and positive enzymes.  She was transferred for urgent cath which was performed radially by Dr. Irish Lack revealing totally occluded RCA which underwent stenting using a 3.5 x 38 mm stent, postdilated with a 4 mm balloon, IVUS guided.  She had "no reflow" treated with IC verapamil resulting in improvement in blood flow.  The previously placed LAD was stent was widely patent. She was taken  back to the lab because of recurrent chest pain and EKG changes revealing a widely patent stent.  Has an allergy to Brilinta, and has been on DAPT with ASA/plavix. No recurrent chest pain. Worked well with cardiac rehab.    2. Hyperlipidemia: intolerant to statin therapy.  PCSK9 has been discussed in the past but she reports her cost is to high.    3. Essential hypertension: on spironolactone, ARB and carvedilol at home. Developed AKI and spiro/ARB were  held. Added hydralazine 25mg  BID at the time of discharge.    4. AKI: Cr increased to 1.9 post cath. Treated with IVFs with improvement to 1.86 the following day. Plan to hold spiro and ARB at discharge.  -- recheck Cr next week   Did the patient have an acute coronary syndrome (MI, NSTEMI, STEMI, etc) this admission?:  Yes                               AHA/ACC Clinical Performance & Quality Measures: Aspirin prescribed? - Yes ADP Receptor Inhibitor (Plavix/Clopidogrel, Brilinta/Ticagrelor or Effient/Prasugrel) prescribed (includes medically managed patients)? - Yes Beta Blocker prescribed? - Yes High Intensity Statin (Lipitor 40-80mg  or Crestor 20-40mg ) prescribed? - No - intolerant EF assessed during THIS hospitalization? - Yes For EF <40%, was ACEI/ARB prescribed? - Not Applicable (EF >/= 54%) For EF <40%, Aldosterone Antagonist (Spironolactone or Eplerenone) prescribed? - Not Applicable (EF >/= 62%) Cardiac Rehab Phase II ordered (including medically managed patients)? - Yes  _____________  Discharge Vitals Blood pressure (!) 152/50, pulse 81, temperature 97.6 F (36.4 C), temperature source Oral, resp. rate 18, height 5\' 5"  (1.651 m), weight 63.5 kg, SpO2 98 %.  Filed Weights   01/16/20 0139 01/18/20 0424  Weight: 59 kg 63.5 kg    Labs & Radiologic Studies    CBC Recent Labs    01/17/20 0550  WBC 9.8  HGB 9.9*  HCT 28.5*  MCV 82.4  PLT 703   Basic Metabolic Panel Recent Labs    01/16/20 1842 01/17/20 0550 01/18/20 0233 01/19/20 0247  NA  --    < > 131* 130*  K  --    < > 4.3 4.5  CL  --    < > 102 104  CO2  --    < > 19* 19*  GLUCOSE  --    < > 106* 103*  BUN  --    < > 34* 39*  CREATININE  --    < > 1.93* 1.86*  CALCIUM  --    < > 8.7* 8.5*  MG 1.2*  --   --   --    < > = values in this interval not displayed.   Liver Function Tests No results for input(s): AST, ALT, ALKPHOS, BILITOT, PROT, ALBUMIN in the last 72 hours. No results for input(s): LIPASE,  AMYLASE in the last 72 hours. High Sensitivity Troponin:   Recent Labs  Lab 01/16/20 0153 01/16/20 0510 01/16/20 0804 01/16/20 1003  TROPONINIHS 842* 741* 675* 614*    BNP Invalid input(s): POCBNP D-Dimer No results for input(s): DDIMER in the last 72 hours. Hemoglobin A1C Recent Labs    01/16/20 1842  HGBA1C 6.0*   Fasting Lipid Panel Recent Labs    01/17/20 0550  TRIG 156*   Thyroid Function Tests Recent Labs    01/16/20 1842  TSH 1.032   _____________  CARDIAC CATHETERIZATION  Result Date: 01/16/2020  Non-stenotic  Mid LAD lesion was previously treated.  Prox Cx to Mid Cx lesion is 20% stenosed.  Dist RCA lesion is 20% stenosed.  Previously placed Mid RCA drug eluting stent is widely patent.  Unclear why she had ST elevations.  No large branch vessel jailed or occluded.  I suspect this was from microvascular disease related to her transient no reflow from the first cath.  Will give additional clopidogrel.  She did not tolerate Brilinta in the past.  I did not use GPIIb/IIIa due to bleeding risk.   CARDIAC CATHETERIZATION  Result Date: 01/16/2020  Previously placed Mid LAD drug eluting stent is widely patent.  Prox Cx to Mid Cx lesion is 20% stenosed.  Mid RCA lesion is 99% stenosed.  A drug-eluting stent was successfully placed using a STENT RESOLUTE ONYX 3.5X38, postdilated to > 4 mm and optimized with IVUS. Transient no reflow when the stent was deployed, which resolved with IC verapamil.  Post intervention, there is a 0% residual stenosis.  Dist RCA lesion is 20% stenosed.  LV end diastolic pressure is low.  There is no aortic valve stenosis.  Continue aggressive secondary prevention.  DAPT for 12 months.  Watch overnight.    DG Chest Port 1 View  Result Date: 01/16/2020 CLINICAL DATA:  Bilateral shoulder pain, left greater than right, radiating to the neck and left arm, chest pain began yesterday EXAM: PORTABLE CHEST 1 VIEW COMPARISON:  Radiograph  02/28/2019 FINDINGS: Chronically coarsened interstitial and bronchitic changes are present throughout both lungs. No focal consolidation. No convincing features of edema. Portion of the left costophrenic sulcus is collimated. No visible pneumothorax or pleural effusion. Coronary stent projects over the level a cardiac silhouette. The aorta is calcified. The remaining cardiomediastinal contours are unremarkable. No acute osseous or soft tissue abnormality. Degenerative changes are present in the imaged spine and shoulders. Surgical clips in the right axilla. Telemetry leads overlie the chest. IMPRESSION: No acute cardiopulmonary abnormality. Chronically coarsened interstitial and bronchitic changes. Prior coronary stenting. Aortic Atherosclerosis (ICD10-I70.0). Electronically Signed   By: Lovena Le M.D.   On: 01/16/2020 02:40   ECHOCARDIOGRAM COMPLETE  Result Date: 01/17/2020    ECHOCARDIOGRAM REPORT   Patient Name:   Crystal [name] Date of Exam: 01/17/2020 Medical Rec #:  [id number]          Height:       65.0 in Accession #:    [id number]         Weight:       130.0 lb Date of Birth:  09-Feb-1934          BSA:          1.647 m Patient Age:    74 years           BP:           133/62 mmHg Patient Gender: F                  HR:           66 bpm. Exam Location:  Inpatient Procedure: 2D Echo, Cardiac Doppler and Color Doppler Indications:    NSTEMI  History:        Patient has prior history of Echocardiogram examinations, most                 recent 01/16/2020. CHF, Acute MI and CAD; Risk Factors:Diabetes,                 Dyslipidemia and Hypertension.  Sonographer:    Clayton Lefort RDCS (AE) Referring Phys: [place]  1. Left ventricular ejection fraction, by estimation, is 50 to 55%. The left ventricle has low normal function. The left ventricle demonstrates regional wall motion abnormalities (see scoring diagram/findings for description). There is moderate left ventricular  hypertrophy. Left ventricular diastolic parameters are consistent with Grade I diastolic dysfunction (impaired relaxation). Elevated left ventricular end-diastolic pressure. The E/e' is 54. There is moderate hypokinesis of the left ventricular, basal-mid inferior wall.  2. Right ventricular systolic function is hyperdynamic. The right ventricular size is normal. There is normal pulmonary artery systolic pressure.  3. Left atrial size was moderately dilated.  4. The mitral valve is abnormal. Trivial mitral valve regurgitation.  5. The aortic valve is tricuspid. Aortic valve regurgitation is not visualized.  6. The inferior vena cava is normal in size with <50% respiratory variability, suggesting right atrial pressure of 8 mmHg. Comparison(s): No significant change from prior study. 01/16/20: LVEF 50-55%. FINDINGS  Left Ventricle: Left ventricular ejection fraction, by estimation, is 50 to 55%. The left ventricle has low normal function. The left ventricle demonstrates regional wall motion abnormalities. Moderate hypokinesis of the left ventricular, basal-mid inferior wall. The left ventricular internal cavity size was normal in size. There is moderate left ventricular hypertrophy. Left ventricular diastolic parameters are consistent with Grade I diastolic dysfunction (impaired relaxation). Elevated left ventricular end-diastolic pressure. The E/e' is 72.  LV Wall Scoring: The inferior wall is hypokinetic. Right Ventricle: The right ventricular size is normal. No increase in right ventricular wall thickness. Right ventricular systolic function is hyperdynamic. There is normal pulmonary artery systolic pressure. The tricuspid regurgitant velocity is 2.47 m/s, and with an assumed right atrial pressure of 8 mmHg, the estimated right ventricular systolic pressure is 39.7 mmHg. Left Atrium: Left atrial size was moderately dilated. Right Atrium: Right atrial size was normal in size. Pericardium: There is no evidence of  pericardial effusion. Mitral Valve: The mitral valve is abnormal. There is moderate thickening of the mitral valve leaflet(s). Trivial mitral valve regurgitation. MV peak gradient, 12.2 mmHg. The mean mitral valve gradient is 3.0 mmHg. Tricuspid Valve: The tricuspid valve is grossly normal. Tricuspid valve regurgitation is trivial. Aortic Valve: The aortic valve is tricuspid. Aortic valve regurgitation is not visualized. Aortic regurgitation PHT measures 446 msec. Aortic valve mean gradient measures 5.0 mmHg. Aortic valve peak gradient measures 8.5 mmHg. Aortic valve area, by VTI measures 1.94 cm. Pulmonic Valve: The pulmonic valve was normal in structure. Pulmonic valve regurgitation is not visualized. Aorta: The aortic root and ascending aorta are structurally normal, with no evidence of dilitation. Venous: The inferior vena cava is normal in size with less than 50% respiratory variability, suggesting right atrial pressure of 8 mmHg. IAS/Shunts: No atrial level shunt detected by color flow Doppler.  LEFT VENTRICLE PLAX 2D LVIDd:         3.70 cm  Diastology LVIDs:         3.00 cm  LV e' medial:    3.23 cm/s LV PW:         1.40 cm  LV E/e' medial:  35.0 LV IVS:        1.60 cm  LV e' lateral:   5.55 cm/s LVOT diam:     1.80 cm  LV E/e' lateral: 20.4 LV SV:         58 LV SV Index:   35 LVOT Area:     2.54 cm  RIGHT  VENTRICLE             IVC RV Basal diam:  3.10 cm     IVC diam: 1.90 cm RV S prime:     12.90 cm/s TAPSE (M-mode): 2.4 cm LEFT ATRIUM           Index       RIGHT ATRIUM           Index LA diam:      3.40 cm 2.06 cm/m  RA Area:     13.30 cm LA Vol (A2C): 68.8 ml 41.77 ml/m RA Volume:   30.20 ml  18.33 ml/m LA Vol (A4C): 65.2 ml 39.58 ml/m  AORTIC VALVE AV Area (Vmax):    1.70 cm AV Area (Vmean):   1.62 cm AV Area (VTI):     1.94 cm AV Vmax:           146.00 cm/s AV Vmean:          101.000 cm/s AV VTI:            0.299 m AV Peak Grad:      8.5 mmHg AV Mean Grad:      5.0 mmHg LVOT Vmax:          97.30 cm/s LVOT Vmean:        64.300 cm/s LVOT VTI:          0.228 m LVOT/AV VTI ratio: 0.76 AI PHT:            446 msec  AORTA Ao Root diam: 3.00 cm Ao Asc diam:  3.00 cm MITRAL VALVE                TRICUSPID VALVE MV Area (PHT): 2.33 cm     TR Peak grad:   24.4 mmHg MV Peak grad:  12.2 mmHg    TR Vmax:        247.00 cm/s MV Mean grad:  3.0 mmHg MV Vmax:       1.75 m/s     SHUNTS MV Vmean:      82.1 cm/s    Systemic VTI:  0.23 m MV Decel Time: 325 msec     Systemic Diam: 1.80 cm MV E velocity: 113.00 cm/s MV A velocity: 164.00 cm/s MV E/A ratio:  0.69 Lyman Bishop MD Electronically signed by Lyman Bishop MD Signature Date/Time: 01/17/2020/4:47:59 PM    Final    ECHOCARDIOGRAM COMPLETE  Result Date: 01/16/2020    ECHOCARDIOGRAM REPORT   Patient Name:   Crystal [name] Date of Exam: 01/16/2020 Medical Rec #:  [id number]          Height:       65.0 in Accession #:    [id number]         Weight:       130.0 lb Date of Birth:  03-18-1934          BSA:          1.647 m Patient Age:    66 years           BP:           188/53 mmHg Patient Gender: F                  HR:           73 bpm. Exam Location:  [facility] Procedure: 2D Echo Indications:    Chest Pain R07.9  History:        Patient has  prior history of Echocardiogram examinations, most                 recent 06/13/2019. CHF, Previous Myocardial Infarction and CAD;                 Risk Factors:Non-Smoker, Dyslipidemia, Diabetes and                 Hypertension.  Sonographer:    Leavy Cella RDCS (AE) Referring Phys: [id number] [place]  1. Left ventricular ejection fraction, by estimation, is 50 to 55%. The left ventricle has low normal function. The left ventricle has no regional wall motion abnormalities. There is mild left ventricular hypertrophy. Left ventricular diastolic parameters are consistent with Grade I diastolic dysfunction (impaired relaxation). Elevated left atrial pressure.  2. Right ventricular systolic function is  normal. The right ventricular size is normal.  3. Left atrial size was mildly dilated.  4. The mitral valve is normal in structure. Mild mitral valve regurgitation. No evidence of mitral stenosis.  5. The aortic valve has an indeterminant number of cusps. Aortic valve regurgitation is mild to moderate. No aortic stenosis is present.  6. The inferior vena cava is normal in size with greater than 50% respiratory variability, suggesting right atrial pressure of 3 mmHg. FINDINGS  Left Ventricle: Left ventricular ejection fraction, by estimation, is 50 to 55%. The left ventricle has low normal function. The left ventricle has no regional wall motion abnormalities. The left ventricular internal cavity size was normal in size. There is mild left ventricular hypertrophy. Left ventricular diastolic parameters are consistent with Grade I diastolic dysfunction (impaired relaxation). Elevated left atrial pressure. Right Ventricle: The right ventricular size is normal. No increase in right ventricular wall thickness. Right ventricular systolic function is normal. Left Atrium: Left atrial size was mildly dilated. Right Atrium: Right atrial size was normal in size. Pericardium: There is no evidence of pericardial effusion. Mitral Valve: The mitral valve is normal in structure. There is mild thickening of the mitral valve leaflet(s). There is mild calcification of the mitral valve leaflet(s). Mild mitral annular calcification. Mild mitral valve regurgitation. No evidence of  mitral valve stenosis. Tricuspid Valve: The tricuspid valve is normal in structure. Tricuspid valve regurgitation is trivial. No evidence of tricuspid stenosis. Aortic Valve: The aortic valve has an indeterminant number of cusps. Aortic valve regurgitation is mild to moderate. Aortic regurgitation PHT measures 356 msec. No aortic stenosis is present. Aortic valve mean gradient measures 4.0 mmHg. Aortic valve peak gradient measures 7.0 mmHg. Aortic valve area,  by VTI measures 1.44 cm. Pulmonic Valve: The pulmonic valve was not well visualized. Pulmonic valve regurgitation is not visualized. No evidence of pulmonic stenosis. Aorta: The aortic root is normal in size and structure. Pulmonary Artery: Indeterminant PASP, inadequate TR jet. Venous: The inferior vena cava is normal in size with greater than 50% respiratory variability, suggesting right atrial pressure of 3 mmHg. IAS/Shunts: No atrial level shunt detected by color flow Doppler.  LEFT VENTRICLE PLAX 2D LVIDd:         4.13 cm  Diastology LVIDs:         3.04 cm  LV e' medial:    0.03 cm/s LV PW:         1.21 cm  LV E/e' medial:  23.9 LV IVS:        1.12 cm  LV e' lateral:   0.04 cm/s LVOT diam:     1.80 cm  LV E/e' lateral:  19.9 LV SV:         43 LV SV Index:   26 LVOT Area:     2.54 cm  RIGHT VENTRICLE RV S prime:     13.20 cm/s LEFT ATRIUM             Index       RIGHT ATRIUM           Index LA diam:        3.70 cm 2.25 cm/m  RA Area:     13.80 cm LA Vol (A2C):   58.3 ml 35.39 ml/m RA Volume:   31.40 ml  19.06 ml/m LA Vol (A4C):   52.6 ml 31.93 ml/m LA Biplane Vol: 58.3 ml 35.39 ml/m  AORTIC VALVE AV Area (Vmax):    1.46 cm AV Area (Vmean):   1.21 cm AV Area (VTI):     1.44 cm AV Vmax:           131.87 cm/s AV Vmean:          95.387 cm/s AV VTI:            0.302 m AV Peak Grad:      7.0 mmHg AV Mean Grad:      4.0 mmHg LVOT Vmax:         75.67 cm/s LVOT Vmean:        45.366 cm/s LVOT VTI:          0.171 m LVOT/AV VTI ratio: 0.57 AI PHT:            356 msec  AORTA Ao Root diam: 2.80 cm MITRAL VALVE                TRICUSPID VALVE MV Area (PHT): 4.10 cm     TR Peak grad:   19.7 mmHg MV Decel Time: 185 msec     TR Vmax:        222.00 cm/s MV E velocity: 0.79 cm/s MV A velocity: 147.00 cm/s  SHUNTS MV E/A ratio:  0.01         Systemic VTI:  0.17 m                             Systemic Diam: 1.80 cm Carlyle Dolly MD Electronically signed by Carlyle Dolly MD Signature Date/Time: 01/16/2020/10:19:54 AM     Final    Disposition   Pt is being discharged home today in good condition.  Follow-up Plans & Appointments     Follow-up Information     Verta Ellen., NP Follow up on 01/26/2020.   Specialty: Cardiology Why: at 1:30pm for your follow up appt.  Contact information: [place] [address] [phone]         [facility] Follow up on 01/23/2020.   Why: Please go for follow up labs. Contact information: [address] [phone]               Discharge Instructions     Amb Referral to Cardiac Rehabilitation   Complete by: As directed    Diagnosis:  NSTEMI Coronary Stents     After initial evaluation and assessments completed: Virtual Based Care may be provided alone or in conjunction with Phase 2 Cardiac Rehab based on patient barriers.: Yes   Diet - low sodium heart healthy   Complete by: As directed    Discharge  instructions   Complete by: As directed    Radial Site Care Refer to this sheet in the next few weeks. These instructions provide you with information on caring for yourself after your procedure. Your caregiver may also give you more specific instructions. Your treatment has been planned according to current medical practices, but problems sometimes occur. Call your caregiver if you have any problems or questions after your procedure. HOME CARE INSTRUCTIONS You may shower the day after the procedure. Remove the bandage (dressing) and gently wash the site with plain soap and water. Gently pat the site dry.  Do not apply powder or lotion to the site.  Do not submerge the affected site in water for 3 to 5 days.  Inspect the site at least twice daily.  Do not flex or bend the affected arm for 24 hours.  No lifting over 5 pounds (2.3 kg) for 5 days after your procedure.  Do not drive home if you are discharged the same day of the procedure. Have someone else drive you.  You may  drive 24 hours after the procedure unless otherwise instructed by your caregiver.  What to expect: Any bruising will usually fade within 1 to 2 weeks.  Blood that collects in the tissue (hematoma) may be painful to the touch. It should usually decrease in size and tenderness within 1 to 2 weeks.  SEEK IMMEDIATE MEDICAL CARE IF: You have unusual pain at the radial site.  You have redness, warmth, swelling, or pain at the radial site.  You have drainage (other than a small amount of blood on the dressing).  You have chills.  You have a fever or persistent symptoms for more than 72 hours.  You have a fever and your symptoms suddenly get worse.  Your arm becomes pale, cool, tingly, or numb.  You have heavy bleeding from the site. Hold pressure on the site.   Groin Site Care Refer to this sheet in the next few weeks. These instructions provide you with information on caring for yourself after your procedure. Your caregiver may also give you more specific instructions. Your treatment has been planned according to current medical practices, but problems sometimes occur. Call your caregiver if you have any problems or questions after your procedure. HOME CARE INSTRUCTIONS You may shower 24 hours after the procedure. Remove the bandage (dressing) and gently wash the site with plain soap and water. Gently pat the site dry.  Do not apply powder or lotion to the site.  Do not sit in a bathtub, swimming pool, or whirlpool for 5 to 7 days.  No bending, squatting, or lifting anything over 10 pounds (4.5 kg) as directed by your caregiver.  Inspect the site at least twice daily.  Do not drive home if you are discharged the same day of the procedure. Have someone else drive you.  You may drive 24 hours after the procedure unless otherwise instructed by your caregiver.  What to expect: Any bruising will usually fade within 1 to 2 weeks.  Blood that collects in the tissue (hematoma) may be painful to the  touch. It should usually decrease in size and tenderness within 1 to 2 weeks.  SEEK IMMEDIATE MEDICAL CARE IF: You have unusual pain at the groin site or down the affected leg.  You have redness, warmth, swelling, or pain at the groin site.  You have drainage (other than a small amount of blood on the dressing).  You have chills.  You have a  fever or persistent symptoms for more than 72 hours.  You have a fever and your symptoms suddenly get worse.  Your leg becomes pale, cool, tingly, or numb.  You have heavy bleeding from the site. Hold pressure on the site. Marland Kitchen  PLEASE DO NOT MISS ANY DOSES OF YOUR PLAVIX!!!!! Also keep a log of you blood pressures and bring back to your follow up appt. Please call the office with any questions.   Patients taking blood thinners should generally stay away from medicines like ibuprofen, Advil, Motrin, naproxen, and Aleve due to risk of stomach bleeding. You may take Tylenol as directed or talk to your primary doctor about alternatives.  Some studies suggest Prilosec/Omeprazole interacts with Plavix. We changed your Prilosec/Omeprazole to the equivalent dose of Protonix for less chance of interaction.  PLEASE ENSURE THAT YOU DO NOT RUN OUT OF YOUR PLAVIX. This medication is very important to remain on for at least one year. IF you have issues obtaining this medication due to cost please CALL the office 3-5 business days prior to running out in order to prevent missing doses of this medication.   Increase activity slowly   Complete by: As directed        Discharge Medications   Allergies as of 01/19/2020       Reactions   Bactrim [sulfamethoxazole-trimethoprim] Nausea And Vomiting   Sulfa Antibiotics Nausea And Vomiting   Amlodipine Other (See Comments)   EDEMA   Clonidine Derivatives Other (See Comments)   FATIGUE   Evista [raloxifene Hcl] Other (See Comments)   GERD   Fosamax [alendronate Sodium] Other (See Comments)   INCREASED GERD     Glipizide Other (See Comments)   PALPITATIONS   Lipitor [atorvastatin Calcium] Other (See Comments)   ACHING   Losartan Other (See Comments)   FATIGUE    Pravastatin Other (See Comments)   ACHING   Azithromycin Rash, Other (See Comments)   FACIAL BURNING         Medication List     STOP taking these medications    candesartan 16 MG tablet Commonly known as: ATACAND   spironolactone 25 MG tablet Commonly known as: ALDACTONE       TAKE these medications    acetaminophen 500 MG tablet Commonly known as: TYLENOL Take 500 mg by mouth every 6 (six) hours as needed for headache (pain).   Alogliptin Benzoate 12.5 MG Tabs Take 12.5 mg by mouth daily at 2 PM.   aspirin EC 81 MG tablet Take 81 mg by mouth every morning. (0930)   CALCIUM 500 + D PO Take 1 tablet by mouth daily before lunch. Citracal plus D3   carvedilol 25 MG tablet Commonly known as: COREG Take 1 tablet (25 mg total) by mouth in the morning and at bedtime. Breakfast & supper   cholecalciferol 25 MCG (1000 UNIT) tablet Commonly known as: VITAMIN D3 Take 1,000 Units by mouth daily with breakfast.   CINNAMON PO Take 1,000 mg by mouth daily before lunch.   clopidogrel 75 MG tablet Commonly known as: PLAVIX TAKE 1 TABLET BY MOUTH DAILY--STOP BRILLINTA. What changed: See the new instructions.   diazepam 2 MG tablet Commonly known as: VALIUM Take 2 mg by mouth 2 (two) times daily as needed (dizzy spells.).   Doxepin HCl 3 MG Tabs Take 3 mg by mouth at bedtime as needed (sleep.).   ferrous sulfate 325 (65 FE) MG tablet Take 325 mg by mouth daily before lunch.   FISH OIL  PO Take 1,576 mg by mouth daily before lunch.   furosemide 20 MG tablet Commonly known as: LASIX Take 1 tablet (20 mg total) by mouth daily.   hydrALAZINE 25 MG tablet Commonly known as: APRESOLINE Take 1 tablet (25 mg total) by mouth 2 (two) times daily.   isosorbide mononitrate 30 MG 24 hr tablet Commonly known as:  IMDUR Take 1 tablet (30 mg total) by mouth daily.   loperamide 2 MG capsule Commonly known as: IMODIUM Take 2-4 mg by mouth 4 (four) times daily as needed for diarrhea or loose stools.   meclizine 25 MG tablet Commonly known as: ANTIVERT Take 25 mg by mouth 2 (two) times daily as needed for dizziness.   multivitamin with minerals Tabs tablet Take 1 tablet by mouth daily. Centrum Silver for Women   multivitamin-lutein Caps capsule Take 1 capsule by mouth daily before lunch.   nitroGLYCERIN 0.4 MG SL tablet Commonly known as: NITROSTAT Place 1 tablet (0.4 mg total) under the tongue every 5 (five) minutes x 3 doses as needed for chest pain.   pantoprazole 40 MG tablet Commonly known as: PROTONIX Take 1 tablet (40 mg total) by mouth daily. Take 30 minutes before breakfast   saccharomyces boulardii 250 MG capsule Commonly known as: FLORASTOR Take 250 mg by mouth 2 (two) times daily as needed (diarrhea).   sodium chloride 0.65 % Soln nasal spray Commonly known as: OCEAN Place 1 spray into both nostrils 2 (two) times daily as needed for congestion.        Outstanding Labs/Studies   BMET 12/27  Duration of Discharge Encounter   Greater than 30 minutes including physician time.  Signed, Reino Bellis, NP 01/19/2020, 9:56 AM   Agree with note by Reino Bellis NP-C  Patient was admitted on 12/20 with inferior STEMI.  She underwent PCI and drug-eluting stenting by Dr. Irish Lack with an excellent result.  She was brought back later that day because of chest pain and EKG changes.  Her initial cath was complicated by no reflow which improved with intracoronary verapamil.  Her follow-up cath revealed the stent to be widely patent.  She has been asymptomatic since.  Her serum creatinine did increase to 1.9 at peak and fell down to the mid 1.8 range.  Her exam is benign today.  She is stable for discharge.  We will need to check a basic metabolic panel next week and follow-up with  Dr. Domenic Polite as an outpatient.  Lorretta Harp, M.D., [place], [facility], [place] [address] [place], [place]  [address]  [phone] 01/19/2020 11:09 AM

## 2020-01-19 NOTE — Progress Notes (Signed)
Pt received discharge instructions and does not have any additional questions or concerns. Pt is ready for discharge. 

## 2020-01-19 NOTE — Consult Note (Signed)
   [facility] Inpatient Consult   01/19/2020  Crystal [name] 25-Nov-1934 [id number]  [place] [facility] [ACO] Patient: Medicare Next Gen   Patient is currently active with [place] [facility] for chronic disease management services.  Patient has been engaged by a [place] [facility] G-NP.  Our community based plan of care has focused on disease management and community resource support.    Patient will receive a post hospital call and will be evaluated for assessments and disease process education.    Plan: Patient transitioned home today and [facility] G-NP is aware.  Of note, [facility] Care Management services does not replace or interfere with any services that are needed or arranged by inpatient [facility] care management team.  For additional questions or referrals please contact:  Natividad Brood, RN BSN [facility] Liaison  [phone] business mobile phone Toll free office [phone]  Fax number: [phone] [email or web address]

## 2020-01-19 NOTE — Care Management Important Message (Signed)
Important Message  Patient Details  Name: Crystal [name] MRN: [id number] Date of Birth: [birth year]   Medicare Important Message Given:  Yes     Katilin Raynes Montine Circle 01/19/2020, 3:25 PM

## 2020-01-23 ENCOUNTER — Other Ambulatory Visit (HOSPITAL_COMMUNITY)
Admission: RE | Admit: 2020-01-23 | Discharge: 2020-01-23 | Disposition: A | Discharge status: Home / Self Care | Payer: Medicare Other | Source: Ambulatory Visit | Attending: Cardiology | Admitting: Cardiology

## 2020-01-23 DIAGNOSIS — Z79899 Other long term (current) drug therapy: Secondary | ICD-10-CM | POA: Insufficient documentation

## 2020-01-23 LAB — BASIC METABOLIC PANEL
Anion gap: 10 (ref 5–15)
BUN: 41 mg/dL — ABNORMAL HIGH (ref 8–23)
CO2: 23 mmol/L (ref 22–32)
Calcium: 9.1 mg/dL (ref 8.9–10.3)
Chloride: 96 mmol/L — ABNORMAL LOW (ref 98–111)
Creatinine, Ser: 1.88 mg/dL — ABNORMAL HIGH (ref 0.44–1.00)
GFR, Estimated: 26 mL/min — ABNORMAL LOW (ref >60)
Glucose, Bld: 109 mg/dL — ABNORMAL HIGH (ref 70–99)
Potassium: 4.1 mmol/L (ref 3.5–5.1)
Sodium: 129 mmol/L — ABNORMAL LOW (ref 135–145)

## 2020-01-24 ENCOUNTER — Other Ambulatory Visit: Payer: Self-pay | Admitting: *Deleted

## 2020-01-24 NOTE — Patient Outreach (Signed)
[place] [name]) Care Management  01/24/2020  Tahjanae Blankenburg [name] 1934/05/10 [id number]  [facility] post hospital outreach   Covering for Crystal [name], [facility] NP  Insurance: [place] [employer]  Admission 01/16/20 to 01/19/20  Diagnosis Non ST elevation myocardial infarction (NSTEMI), diabetes, Hyperlipidemia (HLD), referred for Cardiac Rehab Phase II [place]   Outreach attempt # 1 successful  Patient is able to verify HIPAA, DOB and address Reviewed reason for outreach with patient   Post hospital  Crystal [name] reports she is doing well since her discharge home  She reports being independent with her care without need of DME at this time She is to follow up with [place] cardiology NP on Thursday 01/26/20 She reports she has not followed up with Dr Woody Seller at this time   Edema She has noted some edema of her ankles since discharge She has followed her action plan to stop her spironolactone and atacand She has restarted her Lasix 20 mg and has noted a weight loss of 2 lbs She was at 134 lbs upon discharge and today is at 132 lbs She confirms she is monitoring her sodium intake She continues morning weight and BP monitoring Her BP is reported to be within normal limits  She has good support She stays alone but her niece, nephew, other family members and neighbors are frequently checking on her   Her nephew wanted her to stay with them on 01/19/20 but she did not prefer to do so. Her Niece Crystal [name] stayed with her at her home on Thursday 01/19/20 On 01/21/20 her nephew, wife and family cooked her breakfast for their christmas tradition but she reports she did not eat sausages and only had eggs, toast and juice    On 01/23/20 she went to [facility] for labs  DME at home - scales, thermometer pulse ox BP cuff   Family gave her a mobile phone to carry for safety  Covid  Vaccines x 2 no covid booster at this time but had flu shot  [facility] RN CM and pt reviewed her action plans for CHF,  HTN and available services for MD office, [name] staff and 24 hour nurse line Past Medical History:  Diagnosis Date  . Bell's palsy   . Breast cancer ([place]) 1998   Right mastectomy  . CAD (coronary artery disease)    a. s/p STEMI in 01/2019 with DES to mid-LAD  . CHF (congestive heart failure) ([place])    a. EF 30-35% by echo in 01/2019  . Essential hypertension   . GERD (gastroesophageal reflux disease)   . Gout   . History of skin cancer    Squamous cell, left shoulder  . Mixed hyperlipidemia   . Osteopenia   . Reflux esophagitis   . Thyroid nodule   . Type 2 diabetes mellitus ([place])      Plans [facility] RN CM will update [facility] NP C Spinks  Pt encouraged to return a call to [facility] RN CM prn Routed note to MD Goals Addressed              This Visit's Progress     Patient Stated   .  [facility]) Patient Stated (pt-stated)   On track     Pt will call MD or NP if has increasing sxs HF (wt. Gain, wheezing, SOB, Edema) to overt an ED visit over the next 3 months.    Marland Kitchen  [facility]) Track and Manage Fluids and Swelling-Heart Failure (pt-stated)   On track  Timeframe:  Short-Term Goal Priority:  Medium Start Date:                   01/24/20          Expected End Date:      03/26/20                 Follow Up Date 02/02/20   - call office if I gain more than 2 pounds in one day or 5 pounds in one week - do ankle pumps when sitting - keep legs up while sitting - use salt in moderation - watch for swelling in feet, ankles and legs every day - weigh myself daily     Notes:           Gracelee Stemmler L. Lavina Hamman, RN, BSN, [employer] Coordinator Office number [phone] [facility] number [phone] Fax number [phone]

## 2020-01-25 NOTE — Progress Notes (Signed)
Cardiology Office Note  Date: 01/26/2020   ID: [name], DOB [birth year], MRN [id number]  PCP:  Glenda Chroman, MD  Cardiologist:  Rozann Lesches, MDRD, breast CA, DM2.   Electrophysiologist:  None   Chief Complaint: Follow-up NSTEMI  History of Present Illness: Crystal [name] is a 85 y.o. female with a history of CAD, ischemic cardiomyopathy, congestive heart failure, HLD, HTN, GELast saw Dr. Domenic Polite 12/19/2019.  Reported stable fatigue with ADLs.  Denied any shortness of breath or chest pain.  Echocardiogram in May reveal improvement in LVEF to the range of 45 to 50%.  She did not tolerate Crestor.  History of prior intolerance to Lipitor and Zetia.  Was not interested in any other medications for treatment of hyperlipidemia.  Systolic blood pressures in the 140s to 150s.  Atacand increased to 60 mg a.m. and 80 mg in the evening.  Basic metabolic panel was ordered.  Status post STEMI January 2021 with DES to mid LAD.  Continuing aspirin and Plavix.  Last creatinine was 1.34.  She was continuing Atacand, Coreg, Lasix, Aldactone for ischemic cardiomyopathy.  Recent admission for NSTEMI January 16, 2020.  Prior STEMI in January 2021 with DES to mid LAD.  History of chronic systolic heart failure with prior EF 30 to 35% with most recent EF of 45 to 50%.  The day prior to admission she began having left chest and shoulder pain radiating down left arm.  Troponins were initially 843--741.  EKG with new inferior T wave inversion.  She was transferred from [facility] to [facility] for further management.  She had totally occluded RCA with stent placement.  Had no reflow treated with IC verapamil resulting in improved blood flow.  Previously placed LAD stent widely patent.  She was taken back to the Cath Lab because of her current chest pain and EKG changes revealing a widely patent stent.  Has an allergy to Brilinta.  She had no recurrent chest pain.  She had worked well with cardiac rehab in  the past.  Due to statin intolerance PCSK9 her meds discussed in the past but patient stated the cost was too high.  She was continuing on spironolactone and ARB as well as carvedilol at home.  She developed acute kidney injury and spironolactone/ARB were held.  Hydralazine 25 mg p.o. twice daily was added at time of discharge.  Creatinine increased to 1.9 post cath.  She was treated with IV fluids with improvement of creatinine to 1.86 the following day.  Plan was to hold spironolactone and ARB at discharge.  Plan to recheck creatinine the following week  Patient is here for transitional care follow-up status post recent NSTEMI December 2021.  Previous MI January 21 with DES to mid LAD.  Recent MI demonstrated totally occluded RCA.  DES stent placed.  She had subsequent chest pain and was taken back to the Cath Lab.  Stent remained widely patent.  States since being discharged she feels tired.  Her spironolactone and ARB were stopped due to worsening renal function.  Recent repeat lab work 3 days ago showed creatinine of 1.88 and GFR 26.  This is slightly worse than 7 days ago when creatinine was 1.86 and GFR was 27.  She denies any anginal or exertional symptoms.  Denies any palpitations, orthostatic symptoms, CVA or TIA-like symptoms, bleeding.  Denies any PND, orthopnea.  Denies any claudication-like symptoms, DVT or PE-like symptoms.  Right radial artery access site clean and dry without any  redness or swelling.  2+ pulses.  Right groin access site clean and dry without bruising.  Good pulses.  Denies any significant lower extremity edema.  Past Medical History:  Diagnosis Date  . Bell's palsy   . Breast cancer ([place]) 1998   Right mastectomy  . CAD (coronary artery disease)    a. s/p STEMI in 01/2019 with DES to mid-LAD  . CHF (congestive heart failure) ([place])    a. EF 30-35% by echo in 01/2019  . Essential hypertension   . GERD (gastroesophageal reflux disease)   . Gout   . History of skin cancer     Squamous cell, left shoulder  . Mixed hyperlipidemia   . Osteopenia   . Reflux esophagitis   . Thyroid nodule   . Type 2 diabetes mellitus ([place])     Past Surgical History:  Procedure Laterality Date  . BIOPSY  10/13/2019   Procedure: BIOPSY;  Surgeon: Daneil Dolin, MD;  [facility];  Service: Endoscopy;;  . CATARACT EXTRACTION  2016  . CORONARY ANGIOGRAPHY N/A 01/16/2020   Procedure: CORONARY ANGIOGRAPHY;  Surgeon: Jettie Booze, MD;  Location: [place] [facility] LAB;  Service: Cardiovascular;  Laterality: N/A;  . CORONARY STENT INTERVENTION N/A 01/16/2020   Procedure: CORONARY STENT INTERVENTION;  Surgeon: Jettie Booze, MD;  Location: [place] CV LAB;  Service: Cardiovascular;  Laterality: N/A;  . CORONARY/GRAFT ACUTE MI REVASCULARIZATION N/A 01/30/2019   Procedure: Coronary/Graft Acute MI Revascularization;  Surgeon: Burnell Blanks, MD;  Location: [place] CV LAB;  Service: Cardiovascular;  Laterality: N/A;  . ESOPHAGOGASTRODUODENOSCOPY (EGD) WITH PROPOFOL N/A 10/13/2019   Non-obstructing Schatzki ring at GE junction, s/p dilation, erosive gastropathy with stigmata of recent bleeding, normal duodenum. Negative H.pylori.   Marland Kitchen HYSTEROSCOPY    . INTRAVASCULAR ULTRASOUND/IVUS N/A 01/16/2020   Procedure: Intravascular Ultrasound/IVUS;  Surgeon: Jettie Booze, MD;  Location: [place] [facility];  Service: Cardiovascular;  Laterality: N/A;  . LEFT HEART CATH AND CORONARY ANGIOGRAPHY N/A 01/30/2019   Procedure: LEFT HEART CATH AND CORONARY ANGIOGRAPHY;  Surgeon: Burnell Blanks, MD;  Location: [facility] LAB;  Service: Cardiovascular;  Laterality: N/A;  . LEFT HEART CATH AND CORONARY ANGIOGRAPHY N/A 01/16/2020   Procedure: LEFT HEART CATH AND CORONARY ANGIOGRAPHY;  Surgeon: Jettie Booze, MD;  Location: [place] CV LAB;  Service: Cardiovascular;  Laterality: N/A;  . Venia Minks DILATION N/A 10/13/2019   Procedure: Venia Minks DILATION;   Surgeon: Daneil Dolin, MD;  Location[facility];  Service: Endoscopy;  Laterality: N/A;  . Right mastectomy  1998   [place]    Current Outpatient Medications  Medication Sig Dispense Refill  . acetaminophen (TYLENOL) 500 MG tablet Take 500 mg by mouth every 6 (six) hours as needed for headache (pain).    . Alogliptin Benzoate 12.5 MG TABS Take 12.5 mg by mouth daily at 2 PM.     . aspirin EC 81 MG tablet Take 81 mg by mouth every morning. (0930)    . Calcium Carbonate-Vitamin D (CALCIUM 500 + D PO) Take 1 tablet by mouth daily before lunch. Citracal plus D3    . carvedilol (COREG) 25 MG tablet Take 1 tablet (25 mg total) by mouth in the morning and at bedtime. Breakfast & supper 180 tablet 3  . cholecalciferol (VITAMIN D3) 25 MCG (1000 UT) tablet Take 1,000 Units by mouth daily with breakfast.    . CINNAMON PO Take 1,000 mg by mouth daily before lunch.     Marland Kitchen  clopidogrel (PLAVIX) 75 MG tablet TAKE 1 TABLET BY MOUTH DAILY--STOP BRILLINTA. (Patient taking differently: Take 75 mg by mouth every morning.) 90 tablet 1  . diazepam (VALIUM) 2 MG tablet Take 2 mg by mouth 2 (two) times daily as needed (dizzy spells.).    Marland Kitchen Doxepin HCl 3 MG TABS Take 3 mg by mouth at bedtime as needed (sleep.).     Marland Kitchen ferrous sulfate 325 (65 FE) MG tablet Take 325 mg by mouth daily before lunch.    . furosemide (LASIX) 20 MG tablet Take 1 tablet (20 mg total) by mouth daily. 90 tablet 3  . hydrALAZINE (APRESOLINE) 25 MG tablet Take 1 tablet (25 mg total) by mouth 2 (two) times daily. 180 tablet 3  . isosorbide mononitrate (IMDUR) 30 MG 24 hr tablet Take 1 tablet (30 mg total) by mouth daily.    Marland Kitchen loperamide (IMODIUM) 2 MG capsule Take 2-4 mg by mouth 4 (four) times daily as needed for diarrhea or loose stools.    . meclizine (ANTIVERT) 25 MG tablet Take 25 mg by mouth 2 (two) times daily as needed for dizziness.    . Multiple Vitamin (MULTIVITAMIN WITH MINERALS) TABS tablet Take 1 tablet by mouth daily. Centrum  Silver for Women    . multivitamin-lutein (OCUVITE-LUTEIN) CAPS capsule Take 1 capsule by mouth daily before lunch.     . nitroGLYCERIN (NITROSTAT) 0.4 MG SL tablet Place 1 tablet (0.4 mg total) under the tongue every 5 (five) minutes x 3 doses as needed for chest pain. 25 tablet 2  . Omega-3 Fatty Acids (FISH OIL PO) Take 1,576 mg by mouth daily before lunch.    . pantoprazole (PROTONIX) 40 MG tablet Take 1 tablet (40 mg total) by mouth daily. Take 30 minutes before breakfast 90 tablet 3  . saccharomyces boulardii (FLORASTOR) 250 MG capsule Take 250 mg by mouth 2 (two) times daily as needed (diarrhea).     . sodium chloride (OCEAN) 0.65 % SOLN nasal spray Place 1 spray into both nostrils 2 (two) times daily as needed for congestion.      No current facility-administered medications for this visit.   Allergies:  Bactrim [sulfamethoxazole-trimethoprim], Sulfa antibiotics, Amlodipine, Clonidine derivatives, Evista [raloxifene hcl], Fosamax [alendronate sodium], Glipizide, Lipitor [atorvastatin calcium], Losartan, Pravastatin, and Azithromycin   Social History: The patient  reports that she has never smoked. She has never used smokeless tobacco. She reports that she does not drink alcohol and does not use drugs.   Family History: The patient's family history includes Aneurysm in her sister; Cancer in her sister; Heart attack in her father, maternal uncle, maternal uncle, maternal uncle, paternal aunt, paternal aunt, paternal grandfather, and sister; Heart disease in her maternal uncle, maternal uncle, maternal uncle, paternal aunt, paternal aunt, paternal grandfather, and sister; Stroke in her mother.   ROS:  Please see the history of present illness. Otherwise, complete review of systems is positive for none.  All other systems are reviewed and negative.   Physical Exam: VS:  There were no vitals taken for this visit., BMI There is no height or weight on file to calculate BMI.  Wt Readings from  Last 3 Encounters:  01/18/20 139 lb 15.9 oz (63.5 kg)  01/12/20 135 lb (61.2 kg)  12/19/19 137 lb (62.1 kg)    General: Patient appears comfortable at rest. Neck: Supple, no elevated JVP or carotid bruits, no thyromegaly. Lungs: Clear to auscultation, nonlabored breathing at rest. Cardiac: Regular rate and rhythm, no S3 or significant  systolic murmur, no pericardial rub. Extremities: No pitting edema, distal pulses 2+. Skin: Warm and dry.  Right radial access site and right groin access site clean and dry without redness swelling or edema noted.  2+ pulses Musculoskeletal: No kyphosis. Neuropsychiatric: Alert and oriented x3, affect grossly appropriate.  ECG:  An ECG dated 01/26/2020 was personally reviewed today and demonstrated:  Normal sinus rhythm rate of 64.  Recent Labwork: 02/27/2019: B Natriuretic Peptide 2,582.0 01/16/2020: ALT 14; AST 24; Magnesium 1.2; TSH 1.032 01/17/2020: Hemoglobin 9.9; Platelets 282 01/23/2020: BUN 41; Creatinine, Ser 1.88; Potassium 4.1; Sodium 129     Component Value Date/Time   CHOL 155 04/13/2019 1118   TRIG 156 (H) 01/17/2020 0550   HDL 56 04/13/2019 1118   CHOLHDL 2.8 04/13/2019 1118   VLDL 22 04/13/2019 1118   LDLCALC 77 04/13/2019 1118    Other Studies Reviewed Today:  Cath: 01/16/20   Previously placed Mid LAD drug eluting stent is widely patent.  Prox Cx to Mid Cx lesion is 20% stenosed.  Mid RCA lesion is 99% stenosed.  A drug-eluting stent was successfully placed using a STENT RESOLUTE ONYX 3.5X38, postdilated to > 4 mm and optimized with IVUS. Transient no reflow when the stent was deployed, which resolved with IC verapamil.  Post intervention, there is a 0% residual stenosis.  Dist RCA lesion is 20% stenosed.  LV end diastolic pressure is low.  There is no aortic valve stenosis.  Continue aggressive secondary prevention. DAPT for 12 months.   Watch overnight.  Diagnostic Dominance:  Right    Intervention     Echo: 01/17/20  IMPRESSIONS    1. Left ventricular ejection fraction, by estimation, is 50 to 55%. The  left ventricle has low normal function. The left ventricle has no regional  wall motion abnormalities. There is mild left ventricular hypertrophy.  Left ventricular diastolic  parameters are consistent with Grade I diastolic dysfunction (impaired  relaxation). Elevated left atrial pressure.  2. Right ventricular systolic function is normal. The right ventricular  size is normal.  3. Left atrial size was mildly dilated.  4. The mitral valve is normal in structure. Mild mitral valve  regurgitation. No evidence of mitral stenosis.  5. The aortic valve has an indeterminant number of cusps. Aortic valve  regurgitation is mild to moderate. No aortic stenosis is present.  6. The inferior vena cava is normal in size with greater than 50%  respiratory variability, suggesting right atrial pressure of 3 mmHg.     Cardiac catheterization 01/30/2019:  Prox RCA lesion is 60% stenosed.  Mid RCA lesion is 40% stenosed.  Prox Cx to Mid Cx lesion is 20% stenosed.  Mid LAD lesion is 99% stenosed.  A drug-eluting stent was successfully placed using a SYNERGY XD 3.0X28.  Post intervention, there is a 0% residual stenosis.  1. Severe stenosis mid LAD 2. Successful PTCA/DES x 1 mid LAD 3. Mild non-obstructive disease in the Circumflex 4. Moderate non-obstructive disease in the mid RCA  Recommendations: Will admit to the ICU. DAPT with ASA and Brilinta for one year. Continue home beta blocker. Echo in am. She is statin intolerant. Fast track discharge, possibly home tomorrow if stable.  Echocardiogram 06/13/2019: 1. Left ventricular ejection fraction, by estimation, is 45 to 50%. The  left ventricle has mildly decreased function. The left ventricle  demonstrates regional wall motion abnormalities (see scoring  diagram/findings for  description). Left ventricular  diastolic parameters are consistent with Grade I diastolic dysfunction  (impaired  relaxation). Elevated left ventricular end-diastolic pressure.  2. Right ventricular systolic function is mildly reduced. The right  ventricular size is normal. There is normal pulmonary artery systolic  pressure.  3. Left atrial size was mildly dilated.  4. The mitral valve is degenerative. Mild mitral valve regurgitation.  5. Tricuspid valve regurgitation is mild to moderate.  6. The aortic valve was not well visualized. Aortic valve regurgitation  is mild. No aortic stenosis is present.  7. The inferior vena cava is normal in size with greater than 50%  respiratory variability, suggesting right atrial pressure of 3 mmHg.   Comparison(s): Echocardiogram done 01/31/19 showed an EF of 30-35%.     Assessment and Plan:  1. Non-ST elevation (NSTEMI) myocardial infarction ([place])   2. Ischemic cardiomyopathy   3. CAD in native artery   4. Essential hypertension   5. Stage 3b chronic kidney disease ([place])    1. Non-ST elevation (NSTEMI) myocardial infarction [facility]) Recent NSTEMI with stent placement to the totally occluded RCA.  Currently denies any anginal or exertional symptoms.  Continue aspirin 81 mg daily.  Continue carvedilol 25 mg p.o. twice daily.  Continue Plavix 75 mg daily.  Continue Imdur 30 mg daily.  Continue nitroglycerin sublingual as needed.  2. Ischemic cardiomyopathy Recent repeat echo on 01/17/2020: EF 50 to 55%.  No WMA's, mild LVH, G1 DD.  Elevated left atrial pressure.  Mild mitral regurgitation.  Mild to moderate AR.  3. CAD in native artery Previous STEMI in January with LAD stent.  Status post recent NSTEMI with total occlusion of RCA with DES.  Continue aspirin 81 mg daily.  Continue carvedilol 25 mg p.o. twice daily, continue Plavix 75 mg p.o. daily.  Continue Imdur 30 mg daily.  Continue sublingual nitroglycerin as needed.  4. Essential  hypertension Blood pressure elevated on arrival today at 164/52.  Her spironolactone and losartan had been withheld due to decreasing renal function.  Increase hydralazine to 50 mg p.o. twice daily.  Continue carvedilol 25 mg p.o. twice daily.   5. Stage 3b chronic kidney disease ([place]) Recent increase in renal dysfunction.  Please refer to Dr. Theador Hawthorne nephrology for evaluation regarding stage III CKD.   6.  Hyperlipidemia Intolerant to multiple statins.  Previously insurance company would not pay for PCSK9 inhibitor.  Given her recent significant progression of RCA disease from 60% stenosis to the total occlusion over less than 12 months she needs to start either Repatha or another injectable.  Please refer to lipid clinic for reevaluation.    Medication Adjustments/Labs and Tests Ordered: Current medicines are reviewed at length with the patient today.  Concerns regarding medicines are outlined above.   Disposition: Follow-up with Dr. Domenic Polite or APP 1 month  Signed, Levell July, NP 01/26/2020 1:21 PM    [facility] at [place], [place], [place] [address] Phone: [phone]; Fax: [phone]

## 2020-01-26 ENCOUNTER — Ambulatory Visit: Payer: Medicare Other | Admitting: Family Medicine

## 2020-01-26 ENCOUNTER — Encounter: Payer: Self-pay | Admitting: Family Medicine

## 2020-01-26 ENCOUNTER — Other Ambulatory Visit: Payer: Self-pay

## 2020-01-26 VITALS — BP 164/52 | HR 64 | Resp 16 | Ht 65.0 in | Wt 138.2 lb

## 2020-01-26 DIAGNOSIS — I2510 Atherosclerotic heart disease of native coronary artery without angina pectoris: Secondary | ICD-10-CM

## 2020-01-26 DIAGNOSIS — I255 Ischemic cardiomyopathy: Secondary | ICD-10-CM

## 2020-01-26 DIAGNOSIS — I10 Essential (primary) hypertension: Secondary | ICD-10-CM

## 2020-01-26 DIAGNOSIS — I214 Non-ST elevation (NSTEMI) myocardial infarction: Secondary | ICD-10-CM

## 2020-01-26 DIAGNOSIS — N1832 Chronic kidney disease, stage 3b: Secondary | ICD-10-CM

## 2020-01-26 MED ORDER — HYDRALAZINE HCL 50 MG PO TABS: 50.0000 mg | ORAL_TABLET | Freq: Two times a day (BID) | ORAL | 3 refills | Status: DC

## 2020-01-26 NOTE — Patient Instructions (Signed)
Medication Instructions:  Your physician has recommended you make the following change in your medication:  1.  INCREASE the Hydralazine to 50 mg taking 1 tablet twice a day.  You may take 2 of the 25 mg tablets twice a day to use them up.   *If you need a refill on your cardiac medications before your next appointment, please call your pharmacy*   Lab Work: None ordered  If you have labs (blood work) drawn today and your tests are completely normal, you will receive your results only by: Marland Kitchen MyChart Message (if you have MyChart) OR . A paper copy in the mail If you have any lab test that is abnormal or we need to change your treatment, we will call you to review the results.   Testing/Procedures: None ordered  You have been referred to Dr. Theador Hawthorne, Nephrologist.  They will contact you with an appointment.    Your physician recommends that you schedule a follow-up appointment in: 1st available with the Lipid Clinic at the [facility] office.   Follow-Up: At [facility], you and your health needs are our priority.  As part of our continuing mission to provide you with exceptional heart care, we have created designated Provider Care Teams.  These Care Teams include your primary Cardiologist (physician) and Advanced Practice Providers (APPs -  Physician Assistants and Nurse Practitioners) who all work together to provide you with the care you need, when you need it.  We recommend signing up for the patient portal called "MyChart".  Sign up information is provided on this After Visit Summary.  MyChart is used to connect with patients for Virtual Visits (Telemedicine).  Patients are able to view lab/test results, encounter notes, upcoming appointments, etc.  Non-urgent messages can be sent to your provider as well.   To learn more about what you can do with MyChart, go to [email or web address].    Your next appointment:   1 month(s)  The format for your next appointment:   In  Person  Provider:   Katina Dung, NP      Other Instructions

## 2020-01-31 ENCOUNTER — Other Ambulatory Visit: Payer: Self-pay

## 2020-01-31 ENCOUNTER — Ambulatory Visit (HOSPITAL_COMMUNITY)
Admission: RE | Admit: 2020-01-31 | Discharge: 2020-01-31 | Disposition: A | Discharge status: Home / Self Care | Payer: Medicare Other | Source: Ambulatory Visit | Attending: Gastroenterology | Admitting: Gastroenterology

## 2020-01-31 DIAGNOSIS — R1310 Dysphagia, unspecified: Secondary | ICD-10-CM | POA: Insufficient documentation

## 2020-01-31 DIAGNOSIS — D509 Iron deficiency anemia, unspecified: Secondary | ICD-10-CM | POA: Diagnosis not present

## 2020-01-31 DIAGNOSIS — K224 Dyskinesia of esophagus: Secondary | ICD-10-CM | POA: Diagnosis not present

## 2020-02-01 LAB — CBC WITH DIFFERENTIAL/PLATELET
Absolute Monocytes: 850 cells/uL (ref 200–950)
Basophils Absolute: 39 cells/uL (ref 0–200)
Basophils Relative: 0.5 %
Eosinophils Absolute: 257 cells/uL (ref 15–500)
Eosinophils Relative: 3.3 %
HCT: 26.3 % — ABNORMAL LOW (ref 35.0–45.0)
Hemoglobin: 8.9 g/dL — ABNORMAL LOW (ref 11.7–15.5)
Lymphs Abs: 1451 cells/uL (ref 850–3900)
MCH: 28.2 pg (ref 27.0–33.0)
MCHC: 33.8 g/dL (ref 32.0–36.0)
MCV: 83.2 fL (ref 80.0–100.0)
MPV: 10.6 fL (ref 7.5–12.5)
Monocytes Relative: 10.9 %
Neutro Abs: 5203 cells/uL (ref 1500–7800)
Neutrophils Relative %: 66.7 %
Platelets: 368 10*3/uL (ref 140–400)
RBC: 3.16 10*6/uL — ABNORMAL LOW (ref 3.80–5.10)
RDW: 14.1 % (ref 11.0–15.0)
Total Lymphocyte: 18.6 %
WBC: 7.8 10*3/uL (ref 3.8–10.8)

## 2020-02-01 LAB — IRON,TIBC AND FERRITIN PANEL
%SAT: 15 % (calc) — ABNORMAL LOW (ref 16–45)
Ferritin: 94 ng/mL (ref 16–288)
Iron: 49 ug/dL (ref 45–160)
TIBC: 331 mcg/dL (calc) (ref 250–450)

## 2020-02-02 ENCOUNTER — Ambulatory Visit (INDEPENDENT_AMBULATORY_CARE_PROVIDER_SITE_OTHER): Payer: Medicare Other | Admitting: Pharmacist

## 2020-02-02 ENCOUNTER — Other Ambulatory Visit: Payer: Self-pay

## 2020-02-02 ENCOUNTER — Telehealth: Payer: Self-pay

## 2020-02-02 ENCOUNTER — Other Ambulatory Visit: Payer: Self-pay | Admitting: *Deleted

## 2020-02-02 DIAGNOSIS — T466X5A Adverse effect of antihyperlipidemic and antiarteriosclerotic drugs, initial encounter: Secondary | ICD-10-CM

## 2020-02-02 DIAGNOSIS — G720 Drug-induced myopathy: Secondary | ICD-10-CM | POA: Diagnosis not present

## 2020-02-02 DIAGNOSIS — E782 Mixed hyperlipidemia: Secondary | ICD-10-CM | POA: Diagnosis not present

## 2020-02-02 MED ORDER — LIVALO 1 MG PO TABS: 1.0000 | ORAL_TABLET | Freq: Every day | ORAL | 11 refills | Status: DC

## 2020-02-02 NOTE — Patient Outreach (Signed)
[facility]) Care Management  02/02/2020  Crystal [name] [birth year] [id number]  Telephone outreach post hospitalization for STEMI, Unsuccessful, left message and requested a return call.  [name]. [name], MSN, [facility] Nurse Practitioner [facility] [phone]

## 2020-02-02 NOTE — Patient Instructions (Addendum)
It was a pleasure to meet you!   We will send a new prescription called Livalo 1 mg to your pharmacy. Start taking Livalo 1 tablet once a day.  Please call us if you notice any muscle aches or any other side effects.  We will schedule you to get labs [facility].   Continue your healthy diet and avoiding red meats!   Continue walking outside as the weather permits and inside your house as you can!  [phone] - call with any trouble tolerating your medicine.

## 2020-02-02 NOTE — Progress Notes (Signed)
Patient ID: Crystal [name]                 DOB: [birth year]                    MRN: [id number]     HPI: Crystal [name] is a 86 y.o. female patient referred to lipid clinic by Dr. Domenic [name] . PMH is significant for a history of CAD, ischemic cardiomyopathy, congestive heart failure LVEF 50-55% after 12/2019 ECHO, HLD, HTN, DM, CKD. Prior STEMI January 2021 with DES placed to mid LAD, recent hospital admission for NSTEMI December 2021 that showed total RCA occulusion and DES stent placed. Pt had subsequent chest pain and was taken back to cath lab with stent remaining widely patent.   Pt was last seen 01/26/2020 for transitional care follow-up following December NSTEMI. Since discharge, pt admits to feeling tired. Spironolactone and ARB stopped during admission due to worsening renal function. At last visit, denied anginal or exertional symptoms, palpitations, CVA or TIA-like sx, significant lower extremity edema. Pt was continued on ASA 81 mg daily, carvedilol 25 mg BID, Plavix 75 mg daily, Imdur 30 mg daily. Hydralazine was increased to 50 mg BID.   Pt was seen in lipid clinic January 2021 following STEMI. At that visit, had reported taking atorvastatin for 3-4 years and Zetia for about 1 year and experienced myalgias with both medications. Pt reported eating healthy diet with minimal exercise due to COVID. She was nervous about injection therapy. During visit, she was started on rosuvastatin 5 mg daily with plan to titrate slowly to highest tolerated dose and encouraged a healthy diet with walking around house as tolerated.   Pt intolerant to multiple statins. Lipid panel f/u showed great response to low-dose rosuvastatin from 229 to 77. Pt was hesitant to increase rosuvastatin to 10 mg daily. During f/u call June 2021, pt noted to have chronic diarrhea that started around same time that rosuvastatin initiated. Pt recommended to take 10-drug holiday to see if symptoms improved. Pt reported symptoms  improving when stopping rosuvastatin and she was not interested in restarting statin therapy or injection therapy at that time.  Pt presents today in good spirits. She recalls taking atorvastatin for a couple of years and having muscle aches with it. She does not recall taking pravastatin. She states that she was on ezetimibe and does not know why she was taken off of it. She recalls that she was taking rosuvastatin 5 mg last year and was told that she responded well to it. Pt complained of diarrhea while on it and was told to stop it to see if there was some improvement. She was also taking Brillinta at the time. She reported that her diarrhea symptoms somewhat improved but was still having some diarrhea. She is now seeing GI for it. She reported feeling much better when Brillinta switched to clopidogrel. She also reported now taking iron because her iron levels were low. She states she will not take shots as a cholesterol medicine. Pt wanted to discuss resins, fibrates, niacin and loveza and see if any of those would be good options for her.   Current Medications:  None   Intolerances: Atorvastatin - aches Pravastatin - aches  Rosuvastatin 5 mg - diarrhea Zetia   Risk Factors: two events in last year, HF, HTN, DM cardiomyopathy, extensive family hx of heart disease  LDL goal: < 55 mg/dL due to aggressive disease  Diet: Since MI's, was told to limit sodium  and is trying different herbs as seasoning. Eats a lot of salads, chicken or thin pork chops. If getting hamburger meat opts for 93% or 97% lean meat. Eats lots of vegetables except spinach. Has cereal in the mornings. breakfast: oatmeal, cheerios. Lunch: Sandwiches, leftover dinner. Dinner: potatoes, vegetables. Snacks: nuts, raisins  Exercise: not doing a lot of activity, was called about doing the exercise program but has not started it because she feels she would not tolerate  wearing a mask while exercising. Had been walking outside, but  now it is too cold to walk since she was told to walk in 40-80 temps only. Walking to [employer], walks in the house. Goes grocery shopping  Family History: The patient's family history includes Aneurysm in her sister; Cancer in her sister; Heart attack in her father, maternal uncle, maternal uncle, maternal uncle, paternal aunt, paternal aunt, paternal grandfather, and sister; Heart disease in her maternal uncle, maternal uncle, maternal uncle, paternal aunt, paternal aunt, paternal grandfather, and sister; Stroke in her mother.   Social History: The patient  reports that she has never smoked. She has never used smokeless tobacco. She reports that she does not drink alcohol and does not use drugs  Labs: 04/13/19: TC 155, TG 111, HDL 56, LDL 77 (rosuvastatin 5 mg) 01/30/2019: TC 331, TG 169, HDL 68, LDL 229 (no lipid lowering therapy)  Past Medical History:  Diagnosis Date  . Bell's palsy   . Breast cancer ([place]) 1998   Right mastectomy  . CAD (coronary artery disease)    a. s/p STEMI in 01/2019 with DES to mid-LAD  . CHF (congestive heart failure) ([place])    a. EF 30-35% by echo in 01/2019  . Essential hypertension   . GERD (gastroesophageal reflux disease)   . Gout   . History of skin cancer    Squamous cell, left shoulder  . Mixed hyperlipidemia   . Osteopenia   . Reflux esophagitis   . Thyroid nodule   . Type 2 diabetes mellitus ([place])     Current Outpatient Medications on File Prior to Visit  Medication Sig Dispense Refill  . acetaminophen (TYLENOL) 500 MG tablet Take 500 mg by mouth every 6 (six) hours as needed for headache (pain).    . Alogliptin Benzoate 12.5 MG TABS Take 12.5 mg by mouth daily at 2 PM.     . aspirin EC 81 MG tablet Take 81 mg by mouth every morning. (0930)    . Calcium Carbonate-Vitamin D (CALCIUM 500 + D PO) Take 1 tablet by mouth daily before lunch. Citracal plus D3    . carvedilol (COREG) 25 MG tablet Take 1 tablet (25 mg total) by mouth in the morning and at  bedtime. Breakfast & supper 180 tablet 3  . cholecalciferol (VITAMIN D3) 25 MCG (1000 UT) tablet Take 1,000 Units by mouth daily with breakfast.    . CINNAMON PO Take 1,000 mg by mouth daily before lunch.     . clopidogrel (PLAVIX) 75 MG tablet TAKE 1 TABLET BY MOUTH DAILY--STOP BRILLINTA. (Patient taking differently: Take 75 mg by mouth every morning.) 90 tablet 1  . diazepam (VALIUM) 2 MG tablet Take 2 mg by mouth 2 (two) times daily as needed (dizzy spells.).    Marland Kitchen Doxepin HCl 3 MG TABS Take 3 mg by mouth at bedtime as needed (sleep.).     Marland Kitchen ferrous sulfate 325 (65 FE) MG tablet Take 325 mg by mouth daily before lunch.    . furosemide (LASIX)  20 MG tablet Take 1 tablet (20 mg total) by mouth daily. 90 tablet 3  . hydrALAZINE (APRESOLINE) 50 MG tablet Take 1 tablet (50 mg total) by mouth in the morning and at bedtime. 180 tablet 3  . isosorbide mononitrate (IMDUR) 30 MG 24 hr tablet Take 1 tablet (30 mg total) by mouth daily.    Marland Kitchen loperamide (IMODIUM) 2 MG capsule Take 2-4 mg by mouth 4 (four) times daily as needed for diarrhea or loose stools.    . meclizine (ANTIVERT) 25 MG tablet Take 25 mg by mouth 2 (two) times daily as needed for dizziness.    . Multiple Vitamin (MULTIVITAMIN WITH MINERALS) TABS tablet Take 1 tablet by mouth daily. Centrum Silver for Women    . multivitamin-lutein (OCUVITE-LUTEIN) CAPS capsule Take 1 capsule by mouth daily before lunch.     . nitroGLYCERIN (NITROSTAT) 0.4 MG SL tablet Place 1 tablet (0.4 mg total) under the tongue every 5 (five) minutes x 3 doses as needed for chest pain. 25 tablet 2  . Omega-3 Fatty Acids (FISH OIL PO) Take 1,576 mg by mouth daily before lunch.    . pantoprazole (PROTONIX) 40 MG tablet Take 1 tablet (40 mg total) by mouth daily. Take 30 minutes before breakfast 90 tablet 3  . saccharomyces boulardii (FLORASTOR) 250 MG capsule Take 250 mg by mouth 2 (two) times daily as needed (diarrhea).     . sodium chloride (OCEAN) 0.65 % SOLN nasal spray  Place 1 spray into both nostrils 2 (two) times daily as needed for congestion.      No current facility-administered medications on file prior to visit.    Allergies  Allergen Reactions  . Bactrim [Sulfamethoxazole-Trimethoprim] Nausea And Vomiting  . Sulfa Antibiotics Nausea And Vomiting  . Amlodipine Other (See Comments)    EDEMA  . Clonidine Derivatives Other (See Comments)    FATIGUE  . Evista [Raloxifene Hcl] Other (See Comments)    GERD  . Fosamax [Alendronate Sodium] Other (See Comments)    INCREASED GERD   . Glipizide Other (See Comments)    PALPITATIONS  . Lipitor [Atorvastatin Calcium] Other (See Comments)    ACHING  . Losartan Other (See Comments)    FATIGUE   . Pravastatin Other (See Comments)    ACHING  . Azithromycin Rash and Other (See Comments)    FACIAL BURNING     Assessment/Plan:  1. Hyperlipidemia - Baseline LDL of 229 is far above goal of < 55 mg/dL. Aggressive goal chosen due to 2 ASCVD events in the past year and extensive family history putting pt at high risk. Start Livalo (pitavastatin) 1 mg daily per pt preference. Given multiple statin intolerances, will plan to titrate dose slowly. Pt remains opposed to [id number] injectable therapy. F/u lipid panel scheduled in 3 months. Pt was given copay card to bring to pharmacy. Pt encouraged to continue eating healthy diet and exercising as able.   Other options: If pt does not tolerate Livalo (pitavastatin), can trial low dose/frequency rosuvastatin again. Pt was not sure if side effects were due to rosuvastatin or Brillinta.   Juluis Pitch, PharmD Candidate  Megan E. Supple, PharmD, BCACP, [place] [address], [place], [place] [address] Phone: [phone]; Fax: [phone] 02/02/2020 3:05 PM

## 2020-02-02 NOTE — Telephone Encounter (Signed)
Called patient to let her know prescription was ready for pick up at pharmacy and went through insurance. Reminded pt to bring in copay card from visit today.   Let pt know that pharmacy did not have Livalo 1 mg tabs in stock but did have Livalo 2 mg in stock. Will have her take Livalo 2 mg - 1/2 tab daily. Confirmed with patient that she did have a pill cutter. Pt verbalized understanding.   Juluis Pitch, PharmD Candidate

## 2020-02-03 ENCOUNTER — Other Ambulatory Visit: Payer: Self-pay | Admitting: *Deleted

## 2020-02-03 ENCOUNTER — Telehealth: Payer: Self-pay | Admitting: Pharmacist

## 2020-02-03 NOTE — Telephone Encounter (Signed)
Patient called and reported pharmacy could not process co pay card with Livalo prescription because she is on Medicare.  Advised she should have pharmacy run prescription under her [facility] and apply the coupon to that.  Patient voiced understanding.

## 2020-02-03 NOTE — Patient Outreach (Addendum)
[facility]) Care Management  02/03/2020  Jalaysia Lobb [place] 12/31/1934 [id number]   Telephone outreach for follow up NSTEMI on 12/20 and she does have HF and CKD stage 4.  Ms. Shannon is doing well, improving every day. The only complaint she has today is mild what we agreed must be 2+ edema. No SOB. Her wt is stable. No chest pain. She is able to do her personal care, ambulate around the home, make her meals. She asks about vacuum cleaning. Advised she talk to her cardiology practice about that.  The following were the goals we worked on today.  Goals Addressed              This Visit's Progress     Patient Stated   .  [facility]) Patient Stated (pt-stated)   On track     Pt will call MD or NP if has increasing sxs HF (wt. Gain, wheezing, SOB, Edema) to overt an ED visit over the next 3 months.    Marland Kitchen  [facility]) Track and Manage Fluids and Swelling-Heart Failure (pt-stated)   On track     Timeframe:  Short-Term Goal Priority:  Medium Start Date:                   01/24/20          Expected End Date:      03/26/20                 Follow Up Date 02/02/20   - call office if I gain more than 2 pounds in one day or 5 pounds in one week - do ankle pumps when sitting - keep legs up while sitting - use salt in moderation - watch for swelling in feet, ankles and legs every day - weigh myself daily    Why is this important?    It is important to check your weight daily and watch how much salt and liquids you have.   It will help you to manage your heart failure.    Notes: Today we discussed how to judge her edema level. Explained a little bit of swelling in feet or ankle would be 1+, more swelling extending up into her calf would be 2+, Swelling that is beginning to be significant and leaves a depression when skin is pressed would be 3+ and swelling that pulls the skin tight and shiny, may be weeping and leaves a depression when finger pushed in is 4+. Advise MD if edema is  3+ and more so. Wear support stockings. Elevate legs as much as possible to let gravitiy assist fluid shift and loss.      Need basic education on CKD. Will send related [facility] programs.  We agreed we can talk in a month as long as she will call me if she has problems in between.  [name]. [name], MSN, [facility] Gerontological Nurse Practitioner [facility] Care Management [phone]

## 2020-02-06 ENCOUNTER — Telehealth: Payer: Self-pay | Admitting: Pharmacist

## 2020-02-06 NOTE — Telephone Encounter (Signed)
Patient called asking to speak with [facility]. Offered to assist patient, but she would like Megan to call her back.

## 2020-02-06 NOTE — Telephone Encounter (Signed)
Pharmacy called back, states that her copay before card was about $150 and that the card provides up to $75 off each month, so her copay will continue to be ~$75 per month. Called pt back, she wishes to retry rosuvastatin after she takes her month worth of Livalo since she already paid for it.  Will call pt in 3 weeks to follow up, and plan to transition back to rosuvastatin but at a lower dose of 2.5mg  daily.

## 2020-02-06 NOTE — Telephone Encounter (Signed)
Returned call to pt. She states pharmacy still cannot process her copay card.  ID [id number] BIN [id number] PCN [facility] [place] and provided them with info multiple times again since they kept having issues running the copay card. They are still having issues with it, provided them with pharmacist line directly to copay card [phone].

## 2020-02-07 ENCOUNTER — Other Ambulatory Visit: Payer: Self-pay

## 2020-02-07 DIAGNOSIS — D649 Anemia, unspecified: Secondary | ICD-10-CM

## 2020-02-07 DIAGNOSIS — I10 Essential (primary) hypertension: Secondary | ICD-10-CM | POA: Diagnosis not present

## 2020-02-07 DIAGNOSIS — I6381 Other cerebral infarction due to occlusion or stenosis of small artery: Secondary | ICD-10-CM | POA: Diagnosis not present

## 2020-02-07 DIAGNOSIS — Z299 Encounter for prophylactic measures, unspecified: Secondary | ICD-10-CM | POA: Diagnosis not present

## 2020-02-07 DIAGNOSIS — I5022 Chronic systolic (congestive) heart failure: Secondary | ICD-10-CM | POA: Diagnosis not present

## 2020-02-07 DIAGNOSIS — N1832 Chronic kidney disease, stage 3b: Secondary | ICD-10-CM | POA: Diagnosis not present

## 2020-02-07 DIAGNOSIS — N183 Chronic kidney disease, stage 3 unspecified: Secondary | ICD-10-CM | POA: Diagnosis not present

## 2020-02-10 ENCOUNTER — Telehealth: Payer: Self-pay | Admitting: Family Medicine

## 2020-02-10 DIAGNOSIS — I5022 Chronic systolic (congestive) heart failure: Secondary | ICD-10-CM

## 2020-02-10 DIAGNOSIS — C50919 Malignant neoplasm of unspecified site of unspecified female breast: Secondary | ICD-10-CM | POA: Diagnosis not present

## 2020-02-10 DIAGNOSIS — Z789 Other specified health status: Secondary | ICD-10-CM | POA: Diagnosis not present

## 2020-02-10 DIAGNOSIS — I10 Essential (primary) hypertension: Secondary | ICD-10-CM | POA: Diagnosis not present

## 2020-02-10 DIAGNOSIS — Z299 Encounter for prophylactic measures, unspecified: Secondary | ICD-10-CM | POA: Diagnosis not present

## 2020-02-10 DIAGNOSIS — N1832 Chronic kidney disease, stage 3b: Secondary | ICD-10-CM

## 2020-02-10 DIAGNOSIS — E1165 Type 2 diabetes mellitus with hyperglycemia: Secondary | ICD-10-CM | POA: Diagnosis not present

## 2020-02-10 DIAGNOSIS — I6381 Other cerebral infarction due to occlusion or stenosis of small artery: Secondary | ICD-10-CM | POA: Diagnosis not present

## 2020-02-10 MED ORDER — HYDRALAZINE HCL 50 MG PO TABS: 25.0000 mg | ORAL_TABLET | Freq: Two times a day (BID) | ORAL | Status: DC

## 2020-02-10 NOTE — Telephone Encounter (Signed)
Patient was started on Hydralazine 01/19/2020 at hospital discharge.  Since then, feeling very weak and tired.  Feet & ankles swelling is down in the morning upon waking, but notices increased swelling especially after the second dose for the day.  SOB - new since being on this medication.  Has gained about 5lbs since leaving hospital.    163/53 - this am.    States that she was previously on Candesartan 16mg  - one in the am & 1/2 in the pm - tolerated that well with no problems.  Would like to go back to this if possible.

## 2020-02-10 NOTE — Telephone Encounter (Signed)
Patient called stating that she cannot take the Hydralazine 50mg . States that it is making her very weak, her ankles continue to swell, shortness of breath.

## 2020-02-10 NOTE — Telephone Encounter (Signed)
Left message to return call 

## 2020-02-10 NOTE — Telephone Encounter (Signed)
I am sorry she is having trouble with this medication.  She was switched to this during hospital stay because her kidney function deteriorated and the previous medications had to be held.  See if she would be willing to get a BMET to so that I can see where her creatinine has settled out.  We might be able to make some adjustments then.  If she would like to cut the hydralazine in half for now that might be helpful.

## 2020-02-10 NOTE — Telephone Encounter (Signed)
Patient notified and verbalized understanding.  She will try to get BMET first of next week, weather permitting.  She will go to [place] across from [facility].

## 2020-02-12 ENCOUNTER — Emergency Department (HOSPITAL_COMMUNITY): Payer: Medicare Other

## 2020-02-12 ENCOUNTER — Encounter (HOSPITAL_COMMUNITY): Payer: Self-pay | Admitting: Emergency Medicine

## 2020-02-12 ENCOUNTER — Other Ambulatory Visit: Payer: Self-pay

## 2020-02-12 ENCOUNTER — Inpatient Hospital Stay (HOSPITAL_COMMUNITY)
Admission: EM | Admit: 2020-02-12 | Discharge: 2020-03-04 | DRG: 286 | Disposition: A | Discharge status: Home / Self Care | Payer: Medicare Other | Attending: Cardiology | Admitting: Cardiology

## 2020-02-12 DIAGNOSIS — E871 Hypo-osmolality and hyponatremia: Secondary | ICD-10-CM | POA: Diagnosis present

## 2020-02-12 DIAGNOSIS — J9601 Acute respiratory failure with hypoxia: Secondary | ICD-10-CM | POA: Diagnosis present

## 2020-02-12 DIAGNOSIS — N1832 Chronic kidney disease, stage 3b: Secondary | ICD-10-CM | POA: Diagnosis present

## 2020-02-12 DIAGNOSIS — J810 Acute pulmonary edema: Secondary | ICD-10-CM

## 2020-02-12 DIAGNOSIS — I214 Non-ST elevation (NSTEMI) myocardial infarction: Secondary | ICD-10-CM | POA: Diagnosis not present

## 2020-02-12 DIAGNOSIS — I160 Hypertensive urgency: Secondary | ICD-10-CM

## 2020-02-12 DIAGNOSIS — E785 Hyperlipidemia, unspecified: Secondary | ICD-10-CM | POA: Diagnosis present

## 2020-02-12 DIAGNOSIS — Z8249 Family history of ischemic heart disease and other diseases of the circulatory system: Secondary | ICD-10-CM

## 2020-02-12 DIAGNOSIS — Z888 Allergy status to other drugs, medicaments and biological substances status: Secondary | ICD-10-CM

## 2020-02-12 DIAGNOSIS — I110 Hypertensive heart disease with heart failure: Secondary | ICD-10-CM | POA: Diagnosis not present

## 2020-02-12 DIAGNOSIS — R0602 Shortness of breath: Secondary | ICD-10-CM | POA: Diagnosis not present

## 2020-02-12 DIAGNOSIS — I248 Other forms of acute ischemic heart disease: Secondary | ICD-10-CM | POA: Diagnosis not present

## 2020-02-12 DIAGNOSIS — I083 Combined rheumatic disorders of mitral, aortic and tricuspid valves: Secondary | ICD-10-CM | POA: Diagnosis present

## 2020-02-12 DIAGNOSIS — I130 Hypertensive heart and chronic kidney disease with heart failure and stage 1 through stage 4 chronic kidney disease, or unspecified chronic kidney disease: Secondary | ICD-10-CM | POA: Diagnosis not present

## 2020-02-12 DIAGNOSIS — Z7982 Long term (current) use of aspirin: Secondary | ICD-10-CM

## 2020-02-12 DIAGNOSIS — I5043 Acute on chronic combined systolic (congestive) and diastolic (congestive) heart failure: Secondary | ICD-10-CM | POA: Diagnosis not present

## 2020-02-12 DIAGNOSIS — Z853 Personal history of malignant neoplasm of breast: Secondary | ICD-10-CM

## 2020-02-12 DIAGNOSIS — K219 Gastro-esophageal reflux disease without esophagitis: Secondary | ICD-10-CM | POA: Diagnosis present

## 2020-02-12 DIAGNOSIS — Z7902 Long term (current) use of antithrombotics/antiplatelets: Secondary | ICD-10-CM

## 2020-02-12 DIAGNOSIS — I255 Ischemic cardiomyopathy: Secondary | ICD-10-CM | POA: Diagnosis present

## 2020-02-12 DIAGNOSIS — R195 Other fecal abnormalities: Secondary | ICD-10-CM | POA: Diagnosis present

## 2020-02-12 DIAGNOSIS — I493 Ventricular premature depolarization: Secondary | ICD-10-CM | POA: Diagnosis not present

## 2020-02-12 DIAGNOSIS — E119 Type 2 diabetes mellitus without complications: Secondary | ICD-10-CM

## 2020-02-12 DIAGNOSIS — I161 Hypertensive emergency: Secondary | ICD-10-CM

## 2020-02-12 DIAGNOSIS — I5042 Chronic combined systolic (congestive) and diastolic (congestive) heart failure: Secondary | ICD-10-CM | POA: Diagnosis present

## 2020-02-12 DIAGNOSIS — I272 Pulmonary hypertension, unspecified: Secondary | ICD-10-CM | POA: Diagnosis present

## 2020-02-12 DIAGNOSIS — E782 Mixed hyperlipidemia: Secondary | ICD-10-CM | POA: Diagnosis present

## 2020-02-12 DIAGNOSIS — I480 Paroxysmal atrial fibrillation: Secondary | ICD-10-CM

## 2020-02-12 DIAGNOSIS — Z955 Presence of coronary angioplasty implant and graft: Secondary | ICD-10-CM

## 2020-02-12 DIAGNOSIS — I4892 Unspecified atrial flutter: Secondary | ICD-10-CM | POA: Diagnosis not present

## 2020-02-12 DIAGNOSIS — N189 Chronic kidney disease, unspecified: Secondary | ICD-10-CM

## 2020-02-12 DIAGNOSIS — D631 Anemia in chronic kidney disease: Secondary | ICD-10-CM | POA: Diagnosis present

## 2020-02-12 DIAGNOSIS — R0600 Dyspnea, unspecified: Secondary | ICD-10-CM

## 2020-02-12 DIAGNOSIS — R0683 Snoring: Secondary | ICD-10-CM | POA: Diagnosis not present

## 2020-02-12 DIAGNOSIS — Z20822 Contact with and (suspected) exposure to covid-19: Secondary | ICD-10-CM | POA: Diagnosis present

## 2020-02-12 DIAGNOSIS — Z85828 Personal history of other malignant neoplasm of skin: Secondary | ICD-10-CM

## 2020-02-12 DIAGNOSIS — N179 Acute kidney failure, unspecified: Secondary | ICD-10-CM

## 2020-02-12 DIAGNOSIS — J9811 Atelectasis: Secondary | ICD-10-CM | POA: Diagnosis present

## 2020-02-12 DIAGNOSIS — M109 Gout, unspecified: Secondary | ICD-10-CM | POA: Diagnosis present

## 2020-02-12 DIAGNOSIS — Z79899 Other long term (current) drug therapy: Secondary | ICD-10-CM

## 2020-02-12 DIAGNOSIS — I5023 Acute on chronic systolic (congestive) heart failure: Secondary | ICD-10-CM | POA: Diagnosis not present

## 2020-02-12 DIAGNOSIS — N141 Nephropathy induced by other drugs, medicaments and biological substances: Secondary | ICD-10-CM | POA: Diagnosis present

## 2020-02-12 DIAGNOSIS — F410 Panic disorder [episodic paroxysmal anxiety] without agoraphobia: Secondary | ICD-10-CM | POA: Diagnosis present

## 2020-02-12 DIAGNOSIS — Z882 Allergy status to sulfonamides status: Secondary | ICD-10-CM

## 2020-02-12 DIAGNOSIS — J90 Pleural effusion, not elsewhere classified: Secondary | ICD-10-CM

## 2020-02-12 DIAGNOSIS — R197 Diarrhea, unspecified: Secondary | ICD-10-CM | POA: Diagnosis not present

## 2020-02-12 DIAGNOSIS — Z9889 Other specified postprocedural states: Secondary | ICD-10-CM

## 2020-02-12 DIAGNOSIS — I509 Heart failure, unspecified: Secondary | ICD-10-CM

## 2020-02-12 DIAGNOSIS — E1122 Type 2 diabetes mellitus with diabetic chronic kidney disease: Secondary | ICD-10-CM | POA: Diagnosis present

## 2020-02-12 DIAGNOSIS — T508X5A Adverse effect of diagnostic agents, initial encounter: Secondary | ICD-10-CM | POA: Diagnosis present

## 2020-02-12 DIAGNOSIS — Z9011 Acquired absence of right breast and nipple: Secondary | ICD-10-CM

## 2020-02-12 DIAGNOSIS — Z006 Encounter for examination for normal comparison and control in clinical research program: Secondary | ICD-10-CM

## 2020-02-12 DIAGNOSIS — I2510 Atherosclerotic heart disease of native coronary artery without angina pectoris: Secondary | ICD-10-CM | POA: Diagnosis present

## 2020-02-12 DIAGNOSIS — M8580 Other specified disorders of bone density and structure, unspecified site: Secondary | ICD-10-CM | POA: Diagnosis present

## 2020-02-12 DIAGNOSIS — E876 Hypokalemia: Secondary | ICD-10-CM | POA: Diagnosis not present

## 2020-02-12 DIAGNOSIS — I252 Old myocardial infarction: Secondary | ICD-10-CM

## 2020-02-12 LAB — BASIC METABOLIC PANEL
Anion gap: 13 (ref 5–15)
BUN: 26 mg/dL — ABNORMAL HIGH (ref 8–23)
CO2: 22 mmol/L (ref 22–32)
Calcium: 9.2 mg/dL (ref 8.9–10.3)
Chloride: 94 mmol/L — ABNORMAL LOW (ref 98–111)
Creatinine, Ser: 1.66 mg/dL — ABNORMAL HIGH (ref 0.44–1.00)
GFR, Estimated: 30 mL/min — ABNORMAL LOW (ref >60)
Glucose, Bld: 182 mg/dL — ABNORMAL HIGH (ref 70–99)
Potassium: 4.7 mmol/L (ref 3.5–5.1)
Sodium: 129 mmol/L — ABNORMAL LOW (ref 135–145)

## 2020-02-12 LAB — CBC
HCT: 31.6 % — ABNORMAL LOW (ref 36.0–46.0)
Hemoglobin: 10.2 g/dL — ABNORMAL LOW (ref 12.0–15.0)
MCH: 27.8 pg (ref 26.0–34.0)
MCHC: 32.3 g/dL (ref 30.0–36.0)
MCV: 86.1 fL (ref 80.0–100.0)
Platelets: 400 10*3/uL (ref 150–400)
RBC: 3.67 MIL/uL — ABNORMAL LOW (ref 3.87–5.11)
RDW: 14.6 % (ref 11.5–15.5)
WBC: 12.1 10*3/uL — ABNORMAL HIGH (ref 4.0–10.5)
nRBC: 0 % (ref 0.0–0.2)

## 2020-02-12 LAB — TROPONIN I (HIGH SENSITIVITY): Troponin I (High Sensitivity): 254 ng/L (ref <18)

## 2020-02-12 LAB — RESP PANEL BY RT-PCR (FLU A&B, COVID) ARPGX2
Influenza A by PCR: NEGATIVE
Influenza B by PCR: NEGATIVE
SARS Coronavirus 2 by RT PCR: NEGATIVE

## 2020-02-12 NOTE — ED Triage Notes (Signed)
Called pt for triage a few times with no answer.  Pt came walking from bathroom and had SOB with exertion/ ambulating.  Reports SOB since this morning that has been worse since 3pm.  Sats 88% on room air. Denies pain.

## 2020-02-12 NOTE — ED Notes (Addendum)
Critical Lab result Trop 254.  Dr. Johnney Killian and charge nurse notified.

## 2020-02-13 ENCOUNTER — Encounter (HOSPITAL_COMMUNITY): Payer: Self-pay | Admitting: Cardiology

## 2020-02-13 ENCOUNTER — Encounter (HOSPITAL_COMMUNITY)
Admission: EM | Disposition: A | Discharge status: Home / Self Care | Payer: Self-pay | Source: Home / Self Care | Attending: Internal Medicine

## 2020-02-13 DIAGNOSIS — R195 Other fecal abnormalities: Secondary | ICD-10-CM | POA: Diagnosis not present

## 2020-02-13 DIAGNOSIS — N281 Cyst of kidney, acquired: Secondary | ICD-10-CM | POA: Diagnosis not present

## 2020-02-13 DIAGNOSIS — Z20822 Contact with and (suspected) exposure to covid-19: Secondary | ICD-10-CM | POA: Diagnosis not present

## 2020-02-13 DIAGNOSIS — N189 Chronic kidney disease, unspecified: Secondary | ICD-10-CM | POA: Diagnosis not present

## 2020-02-13 DIAGNOSIS — I214 Non-ST elevation (NSTEMI) myocardial infarction: Secondary | ICD-10-CM | POA: Diagnosis not present

## 2020-02-13 DIAGNOSIS — I2510 Atherosclerotic heart disease of native coronary artery without angina pectoris: Secondary | ICD-10-CM | POA: Diagnosis not present

## 2020-02-13 DIAGNOSIS — I351 Nonrheumatic aortic (valve) insufficiency: Secondary | ICD-10-CM | POA: Diagnosis not present

## 2020-02-13 DIAGNOSIS — N1832 Chronic kidney disease, stage 3b: Secondary | ICD-10-CM | POA: Diagnosis not present

## 2020-02-13 DIAGNOSIS — R0683 Snoring: Secondary | ICD-10-CM | POA: Diagnosis not present

## 2020-02-13 DIAGNOSIS — I161 Hypertensive emergency: Secondary | ICD-10-CM | POA: Diagnosis not present

## 2020-02-13 DIAGNOSIS — K219 Gastro-esophageal reflux disease without esophagitis: Secondary | ICD-10-CM | POA: Diagnosis present

## 2020-02-13 DIAGNOSIS — J811 Chronic pulmonary edema: Secondary | ICD-10-CM | POA: Diagnosis not present

## 2020-02-13 DIAGNOSIS — E1122 Type 2 diabetes mellitus with diabetic chronic kidney disease: Secondary | ICD-10-CM | POA: Diagnosis not present

## 2020-02-13 DIAGNOSIS — R0602 Shortness of breath: Secondary | ICD-10-CM | POA: Diagnosis not present

## 2020-02-13 DIAGNOSIS — J810 Acute pulmonary edema: Secondary | ICD-10-CM | POA: Diagnosis not present

## 2020-02-13 DIAGNOSIS — I130 Hypertensive heart and chronic kidney disease with heart failure and stage 1 through stage 4 chronic kidney disease, or unspecified chronic kidney disease: Secondary | ICD-10-CM | POA: Diagnosis not present

## 2020-02-13 DIAGNOSIS — E876 Hypokalemia: Secondary | ICD-10-CM | POA: Diagnosis not present

## 2020-02-13 DIAGNOSIS — J90 Pleural effusion, not elsewhere classified: Secondary | ICD-10-CM | POA: Diagnosis not present

## 2020-02-13 DIAGNOSIS — I509 Heart failure, unspecified: Secondary | ICD-10-CM | POA: Diagnosis not present

## 2020-02-13 DIAGNOSIS — I4892 Unspecified atrial flutter: Secondary | ICD-10-CM | POA: Diagnosis not present

## 2020-02-13 DIAGNOSIS — D631 Anemia in chronic kidney disease: Secondary | ICD-10-CM | POA: Diagnosis not present

## 2020-02-13 DIAGNOSIS — I5043 Acute on chronic combined systolic (congestive) and diastolic (congestive) heart failure: Secondary | ICD-10-CM

## 2020-02-13 DIAGNOSIS — E782 Mixed hyperlipidemia: Secondary | ICD-10-CM

## 2020-02-13 DIAGNOSIS — I517 Cardiomegaly: Secondary | ICD-10-CM | POA: Diagnosis not present

## 2020-02-13 DIAGNOSIS — I248 Other forms of acute ischemic heart disease: Secondary | ICD-10-CM | POA: Diagnosis not present

## 2020-02-13 DIAGNOSIS — I480 Paroxysmal atrial fibrillation: Secondary | ICD-10-CM | POA: Diagnosis not present

## 2020-02-13 DIAGNOSIS — E785 Hyperlipidemia, unspecified: Secondary | ICD-10-CM | POA: Diagnosis not present

## 2020-02-13 DIAGNOSIS — J9811 Atelectasis: Secondary | ICD-10-CM | POA: Diagnosis not present

## 2020-02-13 DIAGNOSIS — I255 Ischemic cardiomyopathy: Secondary | ICD-10-CM | POA: Diagnosis present

## 2020-02-13 DIAGNOSIS — D500 Iron deficiency anemia secondary to blood loss (chronic): Secondary | ICD-10-CM | POA: Diagnosis not present

## 2020-02-13 DIAGNOSIS — N179 Acute kidney failure, unspecified: Secondary | ICD-10-CM | POA: Diagnosis not present

## 2020-02-13 DIAGNOSIS — J9601 Acute respiratory failure with hypoxia: Secondary | ICD-10-CM | POA: Diagnosis not present

## 2020-02-13 DIAGNOSIS — I10 Essential (primary) hypertension: Secondary | ICD-10-CM | POA: Diagnosis not present

## 2020-02-13 DIAGNOSIS — I2583 Coronary atherosclerosis due to lipid rich plaque: Secondary | ICD-10-CM | POA: Diagnosis not present

## 2020-02-13 DIAGNOSIS — I361 Nonrheumatic tricuspid (valve) insufficiency: Secondary | ICD-10-CM | POA: Diagnosis not present

## 2020-02-13 DIAGNOSIS — I160 Hypertensive urgency: Secondary | ICD-10-CM | POA: Diagnosis not present

## 2020-02-13 DIAGNOSIS — Z7902 Long term (current) use of antithrombotics/antiplatelets: Secondary | ICD-10-CM | POA: Diagnosis not present

## 2020-02-13 DIAGNOSIS — E871 Hypo-osmolality and hyponatremia: Secondary | ICD-10-CM | POA: Diagnosis not present

## 2020-02-13 DIAGNOSIS — E7800 Pure hypercholesterolemia, unspecified: Secondary | ICD-10-CM | POA: Diagnosis not present

## 2020-02-13 DIAGNOSIS — I493 Ventricular premature depolarization: Secondary | ICD-10-CM | POA: Diagnosis not present

## 2020-02-13 DIAGNOSIS — I340 Nonrheumatic mitral (valve) insufficiency: Secondary | ICD-10-CM | POA: Diagnosis not present

## 2020-02-13 DIAGNOSIS — R197 Diarrhea, unspecified: Secondary | ICD-10-CM | POA: Diagnosis not present

## 2020-02-13 DIAGNOSIS — N141 Nephropathy induced by other drugs, medicaments and biological substances: Secondary | ICD-10-CM | POA: Diagnosis present

## 2020-02-13 DIAGNOSIS — F410 Panic disorder [episodic paroxysmal anxiety] without agoraphobia: Secondary | ICD-10-CM | POA: Diagnosis present

## 2020-02-13 DIAGNOSIS — I272 Pulmonary hypertension, unspecified: Secondary | ICD-10-CM | POA: Diagnosis present

## 2020-02-13 HISTORY — PX: RIGHT/LEFT HEART CATH AND CORONARY ANGIOGRAPHY: CATH118266

## 2020-02-13 LAB — BASIC METABOLIC PANEL
Anion gap: 14 (ref 5–15)
BUN: 24 mg/dL — ABNORMAL HIGH (ref 8–23)
CO2: 24 mmol/L (ref 22–32)
Calcium: 9.1 mg/dL (ref 8.9–10.3)
Chloride: 96 mmol/L — ABNORMAL LOW (ref 98–111)
Creatinine, Ser: 1.29 mg/dL — ABNORMAL HIGH (ref 0.44–1.00)
GFR, Estimated: 41 mL/min — ABNORMAL LOW (ref >60)
Glucose, Bld: 120 mg/dL — ABNORMAL HIGH (ref 70–99)
Potassium: 3.3 mmol/L — ABNORMAL LOW (ref 3.5–5.1)
Sodium: 134 mmol/L — ABNORMAL LOW (ref 135–145)

## 2020-02-13 LAB — POCT I-STAT EG7
Acid-Base Excess: 2 mmol/L (ref 0.0–2.0)
Acid-Base Excess: 2 mmol/L (ref 0.0–2.0)
Bicarbonate: 26.3 mmol/L (ref 20.0–28.0)
Bicarbonate: 26.4 mmol/L (ref 20.0–28.0)
Calcium, Ion: 1.22 mmol/L (ref 1.15–1.40)
Calcium, Ion: 1.24 mmol/L (ref 1.15–1.40)
HCT: 24 % — ABNORMAL LOW (ref 36.0–46.0)
HCT: 26 % — ABNORMAL LOW (ref 36.0–46.0)
Hemoglobin: 8.2 g/dL — ABNORMAL LOW (ref 12.0–15.0)
Hemoglobin: 8.8 g/dL — ABNORMAL LOW (ref 12.0–15.0)
O2 Saturation: 63 %
O2 Saturation: 68 %
Potassium: 3.3 mmol/L — ABNORMAL LOW (ref 3.5–5.1)
Potassium: 3.4 mmol/L — ABNORMAL LOW (ref 3.5–5.1)
Sodium: 132 mmol/L — ABNORMAL LOW (ref 135–145)
Sodium: 133 mmol/L — ABNORMAL LOW (ref 135–145)
TCO2: 27 mmol/L (ref 22–32)
TCO2: 28 mmol/L (ref 22–32)
pCO2, Ven: 37.8 mmHg — ABNORMAL LOW (ref 44.0–60.0)
pCO2, Ven: 38.7 mmHg — ABNORMAL LOW (ref 44.0–60.0)
pH, Ven: 7.44 — ABNORMAL HIGH (ref 7.250–7.430)
pH, Ven: 7.452 — ABNORMAL HIGH (ref 7.250–7.430)
pO2, Ven: 31 mmHg — CL (ref 32.0–45.0)
pO2, Ven: 34 mmHg (ref 32.0–45.0)

## 2020-02-13 LAB — POCT I-STAT 7, (LYTES, BLD GAS, ICA,H+H)
Acid-Base Excess: 2 mmol/L (ref 0.0–2.0)
Bicarbonate: 25.3 mmol/L (ref 20.0–28.0)
Calcium, Ion: 1.2 mmol/L (ref 1.15–1.40)
HCT: 26 % — ABNORMAL LOW (ref 36.0–46.0)
Hemoglobin: 8.8 g/dL — ABNORMAL LOW (ref 12.0–15.0)
O2 Saturation: 99 %
Potassium: 3.4 mmol/L — ABNORMAL LOW (ref 3.5–5.1)
Sodium: 133 mmol/L — ABNORMAL LOW (ref 135–145)
TCO2: 26 mmol/L (ref 22–32)
pCO2 arterial: 34.3 mmHg (ref 32.0–48.0)
pH, Arterial: 7.476 — ABNORMAL HIGH (ref 7.350–7.450)
pO2, Arterial: 132 mmHg — ABNORMAL HIGH (ref 83.0–108.0)

## 2020-02-13 LAB — TROPONIN I (HIGH SENSITIVITY)
Troponin I (High Sensitivity): 1735 ng/L (ref <18)
Troponin I (High Sensitivity): 1229 ng/L (ref <18)
Troponin I (High Sensitivity): 1055 ng/L (ref <18)

## 2020-02-13 LAB — HEPARIN LEVEL (UNFRACTIONATED): Heparin Unfractionated: 0.18 IU/mL — ABNORMAL LOW (ref 0.30–0.70)

## 2020-02-13 LAB — BRAIN NATRIURETIC PEPTIDE: B Natriuretic Peptide: 4423.6 pg/mL — ABNORMAL HIGH (ref 0.0–100.0)

## 2020-02-13 SURGERY — RIGHT/LEFT HEART CATH AND CORONARY ANGIOGRAPHY
Anesthesia: LOCAL

## 2020-02-13 MED ORDER — NITROGLYCERIN 0.4 MG SL SUBL: 0.4000 mg | SUBLINGUAL_TABLET | SUBLINGUAL | Status: DC | PRN

## 2020-02-13 MED ORDER — HYDRALAZINE HCL 20 MG/ML IJ SOLN
10.0000 mg | INTRAMUSCULAR | Status: AC | PRN
Start: 2020-02-13 — End: 2020-02-13

## 2020-02-13 MED ORDER — SODIUM CHLORIDE 0.9% FLUSH
3.0000 mL | Freq: Two times a day (BID) | INTRAVENOUS | Status: DC
  Administered 2020-02-13 – 2020-02-18 (×4): 3 mL via INTRAVENOUS

## 2020-02-13 MED ORDER — MIDAZOLAM HCL 2 MG/2ML IJ SOLN
INTRAMUSCULAR | Status: AC
  Filled 2020-02-13: qty 2

## 2020-02-13 MED ORDER — ASPIRIN 81 MG PO CHEW
324.0000 mg | CHEWABLE_TABLET | Freq: Once | ORAL | Status: AC
  Administered 2020-02-13: 324 mg via ORAL
  Filled 2020-02-13: qty 4

## 2020-02-13 MED ORDER — LABETALOL HCL 5 MG/ML IV SOLN: 10.0000 mg | INTRAVENOUS | Status: AC | PRN

## 2020-02-13 MED ORDER — HEPARIN SODIUM (PORCINE) 1000 UNIT/ML IJ SOLN
INTRAMUSCULAR | Status: AC
  Filled 2020-02-13: qty 1

## 2020-02-13 MED ORDER — PANTOPRAZOLE SODIUM 40 MG PO TBEC
40.0000 mg | DELAYED_RELEASE_TABLET | Freq: Every day | ORAL | Status: DC
  Administered 2020-02-13 – 2020-03-04 (×21): 40 mg via ORAL
  Filled 2020-02-13 (×21): qty 1

## 2020-02-13 MED ORDER — FUROSEMIDE 10 MG/ML IJ SOLN
20.0000 mg | Freq: Once | INTRAMUSCULAR | Status: AC
  Administered 2020-02-13: 20 mg via INTRAVENOUS
  Filled 2020-02-13: qty 2

## 2020-02-13 MED ORDER — LIDOCAINE HCL (PF) 1 % IJ SOLN
INTRAMUSCULAR | Status: DC | PRN
  Administered 2020-02-13 (×2): 2 mL via INTRADERMAL

## 2020-02-13 MED ORDER — MIDAZOLAM HCL 2 MG/2ML IJ SOLN
INTRAMUSCULAR | Status: DC | PRN
  Administered 2020-02-13: 1 mg via INTRAVENOUS

## 2020-02-13 MED ORDER — ENOXAPARIN SODIUM 30 MG/0.3ML ~~LOC~~ SOLN
30.0000 mg | SUBCUTANEOUS | Status: DC
  Administered 2020-02-14: 30 mg via SUBCUTANEOUS
  Filled 2020-02-13: qty 0.3

## 2020-02-13 MED ORDER — HEPARIN (PORCINE) IN NACL 1000-0.9 UT/500ML-% IV SOLN
INTRAVENOUS | Status: DC | PRN
  Administered 2020-02-13 (×2): 500 mL

## 2020-02-13 MED ORDER — HEPARIN (PORCINE) IN NACL 1000-0.9 UT/500ML-% IV SOLN
INTRAVENOUS | Status: AC
  Filled 2020-02-13: qty 1500

## 2020-02-13 MED ORDER — FENTANYL CITRATE (PF) 100 MCG/2ML IJ SOLN
INTRAMUSCULAR | Status: AC
  Filled 2020-02-13: qty 2

## 2020-02-13 MED ORDER — HEPARIN (PORCINE) IN NACL 1000-0.9 UT/500ML-% IV SOLN
INTRAVENOUS | Status: DC | PRN
  Administered 2020-02-13: 500 mL

## 2020-02-13 MED ORDER — CALCIUM CARBONATE-VITAMIN D 500-200 MG-UNIT PO TABS
1.0000 | ORAL_TABLET | Freq: Every day | ORAL | Status: DC
  Administered 2020-02-13 – 2020-03-03 (×20): 1 via ORAL
  Filled 2020-02-13 (×24): qty 1

## 2020-02-13 MED ORDER — SODIUM CHLORIDE 0.9 % IV SOLN: INTRAVENOUS | Status: AC

## 2020-02-13 MED ORDER — IOHEXOL 350 MG/ML SOLN
INTRAVENOUS | Status: DC | PRN
  Administered 2020-02-13: 40 mL

## 2020-02-13 MED ORDER — SODIUM CHLORIDE 0.9 % IV SOLN
250.0000 mL | INTRAVENOUS | Status: DC | PRN
  Administered 2020-02-26: 250 mL via INTRAVENOUS

## 2020-02-13 MED ORDER — ASPIRIN 81 MG PO CHEW: 81.0000 mg | CHEWABLE_TABLET | ORAL | Status: DC

## 2020-02-13 MED ORDER — CLOPIDOGREL BISULFATE 75 MG PO TABS
75.0000 mg | ORAL_TABLET | Freq: Every morning | ORAL | Status: DC
  Administered 2020-02-13 – 2020-03-04 (×21): 75 mg via ORAL
  Filled 2020-02-13 (×21): qty 1

## 2020-02-13 MED ORDER — VERAPAMIL HCL 2.5 MG/ML IV SOLN
INTRAVENOUS | Status: DC | PRN
  Administered 2020-02-13: 10 mL via INTRA_ARTERIAL

## 2020-02-13 MED ORDER — ASPIRIN EC 81 MG PO TBEC
81.0000 mg | DELAYED_RELEASE_TABLET | Freq: Every morning | ORAL | Status: DC
  Administered 2020-02-13 – 2020-02-16 (×4): 81 mg via ORAL
  Filled 2020-02-13 (×3): qty 1

## 2020-02-13 MED ORDER — NON FORMULARY: 1.0000 mg | Freq: Every day | Status: DC

## 2020-02-13 MED ORDER — LIDOCAINE HCL (PF) 1 % IJ SOLN
INTRAMUSCULAR | Status: AC
  Filled 2020-02-13: qty 30

## 2020-02-13 MED ORDER — ISOSORBIDE MONONITRATE ER 30 MG PO TB24
30.0000 mg | ORAL_TABLET | Freq: Every day | ORAL | Status: DC
  Administered 2020-02-13 – 2020-02-22 (×10): 30 mg via ORAL
  Filled 2020-02-13 (×10): qty 1

## 2020-02-13 MED ORDER — VITAMIN D 25 MCG (1000 UNIT) PO TABS
1000.0000 [IU] | ORAL_TABLET | Freq: Every day | ORAL | Status: DC
  Administered 2020-02-13 – 2020-03-04 (×21): 1000 [IU] via ORAL
  Filled 2020-02-13 (×23): qty 1

## 2020-02-13 MED ORDER — ACETAMINOPHEN 325 MG PO TABS
650.0000 mg | ORAL_TABLET | ORAL | Status: DC | PRN
  Administered 2020-02-14 (×4): 650 mg via ORAL
  Filled 2020-02-13 (×5): qty 2

## 2020-02-13 MED ORDER — ONDANSETRON HCL 4 MG/2ML IJ SOLN
4.0000 mg | Freq: Four times a day (QID) | INTRAMUSCULAR | Status: DC | PRN
  Administered 2020-02-15 – 2020-02-17 (×2): 4 mg via INTRAVENOUS
  Filled 2020-02-13 (×2): qty 2

## 2020-02-13 MED ORDER — HEPARIN (PORCINE) 25000 UT/250ML-% IV SOLN
750.0000 [IU]/h | INTRAVENOUS | Status: DC
  Administered 2020-02-13: 750 [IU]/h via INTRAVENOUS
  Filled 2020-02-13: qty 250

## 2020-02-13 MED ORDER — FERROUS SULFATE 325 (65 FE) MG PO TABS
325.0000 mg | ORAL_TABLET | Freq: Every day | ORAL | Status: DC
  Administered 2020-02-14 – 2020-03-04 (×20): 325 mg via ORAL
  Filled 2020-02-13 (×20): qty 1

## 2020-02-13 MED ORDER — CARVEDILOL 25 MG PO TABS
25.0000 mg | ORAL_TABLET | Freq: Two times a day (BID) | ORAL | Status: DC
  Administered 2020-02-13 – 2020-02-14 (×4): 25 mg via ORAL
  Filled 2020-02-13: qty 2
  Filled 2020-02-13 (×3): qty 1

## 2020-02-13 MED ORDER — POTASSIUM CHLORIDE CRYS ER 20 MEQ PO TBCR
40.0000 meq | EXTENDED_RELEASE_TABLET | Freq: Once | ORAL | Status: AC
  Administered 2020-02-13: 40 meq via ORAL
  Filled 2020-02-13: qty 2

## 2020-02-13 MED ORDER — VERAPAMIL HCL 2.5 MG/ML IV SOLN
INTRAVENOUS | Status: AC
  Filled 2020-02-13: qty 2

## 2020-02-13 MED ORDER — SODIUM CHLORIDE 0.9% FLUSH
3.0000 mL | Freq: Two times a day (BID) | INTRAVENOUS | Status: DC
  Administered 2020-02-14 – 2020-03-03 (×33): 3 mL via INTRAVENOUS

## 2020-02-13 MED ORDER — ENOXAPARIN SODIUM 40 MG/0.4ML ~~LOC~~ SOLN: 40.0000 mg | SUBCUTANEOUS | Status: DC

## 2020-02-13 MED ORDER — HEPARIN SODIUM (PORCINE) 1000 UNIT/ML IJ SOLN
INTRAMUSCULAR | Status: DC | PRN
  Administered 2020-02-13: 3500 [IU] via INTRAVENOUS

## 2020-02-13 MED ORDER — HYDRALAZINE HCL 25 MG PO TABS
25.0000 mg | ORAL_TABLET | Freq: Three times a day (TID) | ORAL | Status: DC
  Administered 2020-02-13 – 2020-02-19 (×17): 25 mg via ORAL
  Filled 2020-02-13 (×17): qty 1

## 2020-02-13 MED ORDER — SODIUM CHLORIDE 0.9 % IV SOLN: INTRAVENOUS | Status: DC

## 2020-02-13 MED ORDER — FENTANYL CITRATE (PF) 100 MCG/2ML IJ SOLN
INTRAMUSCULAR | Status: DC | PRN
  Administered 2020-02-13: 25 ug via INTRAVENOUS

## 2020-02-13 MED ORDER — HEPARIN BOLUS VIA INFUSION
3000.0000 [IU] | Freq: Once | INTRAVENOUS | Status: AC
  Administered 2020-02-13: 3000 [IU] via INTRAVENOUS
  Filled 2020-02-13: qty 3000

## 2020-02-13 SURGICAL SUPPLY — 11 items

## 2020-02-13 NOTE — Progress Notes (Signed)
ANTICOAGULATION CONSULT NOTE - Initial Consult  Pharmacy Consult for Heparin Indication: chest pain/ACS  Allergies  Allergen Reactions  . Bactrim [Sulfamethoxazole-Trimethoprim] Nausea And Vomiting  . Sulfa Antibiotics Nausea And Vomiting  . Amlodipine Other (See Comments)    EDEMA  . Clonidine Derivatives Other (See Comments)    FATIGUE  . Evista [Raloxifene Hcl] Other (See Comments)    GERD  . Fosamax [Alendronate Sodium] Other (See Comments)    INCREASED GERD   . Glipizide Other (See Comments)    PALPITATIONS  . Lipitor [Atorvastatin Calcium] Other (See Comments)    ACHING  . Losartan Other (See Comments)    FATIGUE   . Pravastatin Other (See Comments)    ACHING  . Azithromycin Rash and Other (See Comments)    FACIAL BURNING     Patient Measurements: Height: 5\' 5"  (165.1 cm) Weight: 64 kg (141 lb) IBW/kg (Calculated) : 57  Vital Signs: Temp: 98.9 F (37.2 C) (01/17 0034) Temp Source: Oral (01/17 0034) BP: 153/60 (01/17 0545) Pulse Rate: 99 (01/17 0545)  Labs: Recent Labs    02/12/20 1726 02/12/20 2300  HGB 10.2*  --   HCT 31.6*  --   PLT 400  --   CREATININE 1.66*  --   TROPONINIHS 254* 1,735*    Estimated Creatinine Clearance: 23.1 mL/min (A) (by C-G formula based on SCr of 1.66 mg/dL (H)).   Medical History: Past Medical History:  Diagnosis Date  . Bell's palsy   . Breast cancer ([place]) 1998   Right mastectomy  . CAD (coronary artery disease)    a. s/p STEMI in 01/2019 with DES to mid-LAD  . CHF (congestive heart failure) ([place])    a. EF 30-35% by echo in 01/2019  . Essential hypertension   . GERD (gastroesophageal reflux disease)   . Gout   . History of skin cancer    Squamous cell, left shoulder  . Mixed hyperlipidemia   . Osteopenia   . Reflux esophagitis   . Thyroid nodule   . Type 2 diabetes mellitus ([facility])     Medications:  No current facility-administered medications on file prior to encounter.   Current Outpatient Medications  on File Prior to Encounter  Medication Sig Dispense Refill  . acetaminophen (TYLENOL) 500 MG tablet Take 500 mg by mouth every 6 (six) hours as needed for headache (pain).    . Alogliptin Benzoate 12.5 MG TABS Take 12.5 mg by mouth daily at 2 PM.     . aspirin EC 81 MG tablet Take 81 mg by mouth every morning. (0930)    . Calcium Carbonate-Vitamin D (CALCIUM 500 + D PO) Take 1 tablet by mouth daily before lunch. Citracal plus D3    . carvedilol (COREG) 25 MG tablet Take 1 tablet (25 mg total) by mouth in the morning and at bedtime. Breakfast & supper 180 tablet 3  . cholecalciferol (VITAMIN D3) 25 MCG (1000 UT) tablet Take 1,000 Units by mouth daily with breakfast.    . CINNAMON PO Take 1,000 mg by mouth daily before lunch.     . clopidogrel (PLAVIX) 75 MG tablet TAKE 1 TABLET BY MOUTH DAILY--STOP BRILLINTA. (Patient taking differently: Take 75 mg by mouth every morning.) 90 tablet 1  . diazepam (VALIUM) 2 MG tablet Take 2 mg by mouth 2 (two) times daily as needed (dizzy spells.).    Marland Kitchen Doxepin HCl 3 MG TABS Take 3 mg by mouth at bedtime as needed (sleep.).     Marland Kitchen ferrous  sulfate 325 (65 FE) MG tablet Take 325 mg by mouth daily before lunch.    . furosemide (LASIX) 20 MG tablet Take 1 tablet (20 mg total) by mouth daily. 90 tablet 3  . hydrALAZINE (APRESOLINE) 50 MG tablet Take 0.5 tablets (25 mg total) by mouth in the morning and at bedtime.    . isosorbide mononitrate (IMDUR) 30 MG 24 hr tablet Take 1 tablet (30 mg total) by mouth daily. (Patient taking differently: Take 30 mg by mouth at bedtime.)    . loperamide (IMODIUM) 2 MG capsule Take 2-4 mg by mouth 4 (four) times daily as needed for diarrhea or loose stools.    . meclizine (ANTIVERT) 25 MG tablet Take 25 mg by mouth 2 (two) times daily as needed for dizziness.    . Multiple Vitamin (MULTIVITAMIN WITH MINERALS) TABS tablet Take 1 tablet by mouth daily. Centrum Silver for Women    . multivitamin-lutein (OCUVITE-LUTEIN) CAPS capsule Take 1  capsule by mouth daily before lunch.     . nitroGLYCERIN (NITROSTAT) 0.4 MG SL tablet Place 1 tablet (0.4 mg total) under the tongue every 5 (five) minutes x 3 doses as needed for chest pain. 25 tablet 2  . Omega-3 Fatty Acids (FISH OIL PO) Take 1,576 mg by mouth daily before lunch.    . pantoprazole (PROTONIX) 40 MG tablet Take 1 tablet (40 mg total) by mouth daily. Take 30 minutes before breakfast 90 tablet 3  . Pitavastatin Calcium (LIVALO) 1 MG TABS Take 1 tablet (1 mg total) by mouth daily. 30 tablet 11  . saccharomyces boulardii (FLORASTOR) 250 MG capsule Take 250 mg by mouth 2 (two) times daily as needed (diarrhea).     . sodium chloride (OCEAN) 0.65 % SOLN nasal spray Place 1 spray into both nostrils 2 (two) times daily as needed for congestion.        Assessment: 86 y.o. female with SOB/NSTEMI for heparin Goal of Therapy:  Heparin level 0.3-0.7 units/ml Monitor platelets by anticoagulation protocol: Yes   Plan:  Heparin 3000 units IV bolus, then start heparin 750 units/hr Check heparin level in 8 hours.   Caryl Pina 02/13/2020,6:04 AM

## 2020-02-13 NOTE — H&P (Signed)
Cardiology Admission History and Physical:   Patient ID: Crystal MELOY MRN: [id number]; DOB: May 11, 1934   Admission date: 02/12/2020  Primary Care Provider: Glenda Chroman, MD Primary Cardiologist: Rozann Lesches, MD  Primary Electrophysiologist:  None   Chief Complaint:  Shortness of breath  Patient Profile:   Crystal [name] is a 86 y.o. female with CAD with recent PCI in 12/2019 (PCI to RCA) and STEMI in 01/2019 (PCI LAD), chronic systolic HF (recovery of EF to 50%), HTN, CKD who presents with exertional shortness of breath and found to have NSTEMI.  History of Present Illness:   Crystal [name] first began having gradual onset of shortness of breath over the last 2 days. It has been significantly worse over the last 12 hours especially with exertion or any attempts at ambulation. She has been having progressive exertional dyspnea and jaw pain over the last few weeks, but overnight was the worst she has had. Has also had night sweats. She has not had any chest pain otherwise. She denies any fevers, chills, cough, nausea, vomiting, diaphoreisis. She is having occasional palpitations. No sick contacts. Lives alone and recently struggling with iADLs due to SOB.   On arrival to ED, she was markedly hypertensive with BP as high as 216/105. HR 90s. Afebrile. WBC 12.1, Hgb 10.2, sCr 1.66, troponin 254 -> 1735. COVID negative by PCR. BNP > 4000   Past Medical History:  Diagnosis Date  . Bell's palsy   . Breast cancer ([place]) 1998   Right mastectomy  . CAD (coronary artery disease)    a. s/p STEMI in 01/2019 with DES to mid-LAD  . CHF (congestive heart failure) ([place])    a. EF 30-35% by echo in 01/2019  . Essential hypertension   . GERD (gastroesophageal reflux disease)   . Gout   . History of skin cancer    Squamous cell, left shoulder  . Mixed hyperlipidemia   . Osteopenia   . Reflux esophagitis   . Thyroid nodule   . Type 2 diabetes mellitus ([place])     Past Surgical History:   Procedure Laterality Date  . BIOPSY  10/13/2019   Procedure: BIOPSY;  Surgeon: Daneil Dolin, MD;  [facility];  Service: Endoscopy;;  . CATARACT EXTRACTION  2016  . CORONARY ANGIOGRAPHY N/A 01/16/2020   Procedure: CORONARY ANGIOGRAPHY;  Surgeon: Jettie Booze, MD;  Location: [place] [facility];  Service: Cardiovascular;  Laterality: N/A;  . CORONARY STENT INTERVENTION N/A 01/16/2020   Procedure: CORONARY STENT INTERVENTION;  Surgeon: Jettie Booze, MD;  Location: [place] [facility];  Service: Cardiovascular;  Laterality: N/A;  . CORONARY/GRAFT ACUTE MI REVASCULARIZATION N/A 01/30/2019   Procedure: Coronary/Graft Acute MI Revascularization;  Surgeon: Burnell Blanks, MD;  Location: [place] [facility];  Service: Cardiovascular;  Laterality: N/A;  . ESOPHAGOGASTRODUODENOSCOPY (EGD) WITH PROPOFOL N/A 10/13/2019   Non-obstructing Schatzki ring at GE junction, s/p dilation, erosive gastropathy with stigmata of recent bleeding, normal duodenum. Negative H.pylori.   [facility] Kitchen HYSTEROSCOPY    . INTRAVASCULAR ULTRASOUND/IVUS N/A 01/16/2020   Procedure: Intravascular Ultrasound/IVUS;  Surgeon: Jettie Booze, MD;  Location: [place] [facility];  Service: Cardiovascular;  Laterality: N/A;  . LEFT HEART CATH AND CORONARY ANGIOGRAPHY N/A 01/30/2019   Procedure: LEFT HEART CATH AND CORONARY ANGIOGRAPHY;  Surgeon: Burnell Blanks, MD;  Location: [place] [facility];  Service: Cardiovascular;  Laterality: N/A;  . LEFT HEART CATH AND CORONARY ANGIOGRAPHY N/A 01/16/2020   Procedure: LEFT  HEART CATH AND CORONARY ANGIOGRAPHY;  Surgeon: Jettie Booze, MD;  Location: [place] [facility];  Service: Cardiovascular;  Laterality: N/A;  . Venia Minks DILATION N/A 10/13/2019   Procedure: Venia Minks DILATION;  Surgeon: Daneil Dolin, MD;  [facility];  Service: Endoscopy;  Laterality: N/A;  . Right mastectomy  1998   [place]     Medications Prior to Admission: Prior to  Admission medications   Medication Sig Start Date End Date Taking? Authorizing Provider  acetaminophen (TYLENOL) 500 MG tablet Take 500 mg by mouth every 6 (six) hours as needed for headache (pain).   Yes [provider]  Alogliptin Benzoate 12.5 MG TABS Take 12.5 mg by mouth daily at 2 PM.  06/29/19  Yes [provider]  aspirin EC 81 MG tablet Take 81 mg by mouth every morning. (0930)   Yes [provider]  Calcium Carbonate-Vitamin D (CALCIUM 500 + D PO) Take 1 tablet by mouth daily before lunch. Citracal plus D3   Yes [provider]  carvedilol (COREG) 25 MG tablet Take 1 tablet (25 mg total) by mouth in the morning and at bedtime. Breakfast & supper 11/28/19  Yes Satira Sark, MD  cholecalciferol (VITAMIN D3) 25 MCG (1000 UT) tablet Take 1,000 Units by mouth daily with breakfast.   Yes [provider]  CINNAMON PO Take 1,000 mg by mouth daily before lunch.    Yes [provider]  clopidogrel (PLAVIX) 75 MG tablet TAKE 1 TABLET BY MOUTH DAILY--STOP BRILLINTA. Patient taking differently: Take 75 mg by mouth every morning. 10/05/19  Yes Satira Sark, MD  diazepam (VALIUM) 2 MG tablet Take 2 mg by mouth 2 (two) times daily as needed (dizzy spells.).   Yes [provider]  Doxepin HCl 3 MG TABS Take 3 mg by mouth at bedtime as needed (sleep.).  09/26/19  Yes [provider]  ferrous sulfate 325 (65 FE) MG tablet Take 325 mg by mouth daily before lunch.   Yes [provider]  furosemide (LASIX) 20 MG tablet Take 1 tablet (20 mg total) by mouth daily. 03/10/19 10/05/20 Yes Strader, Fransisco Hertz, PA-C  hydrALAZINE (APRESOLINE) 50 MG tablet Take 0.5 tablets (25 mg total) by mouth in the morning and at bedtime. 02/10/20 05/10/20 Yes Satira Sark, MD  isosorbide mononitrate (IMDUR) 30 MG 24 hr tablet Take 1 tablet (30 mg total) by mouth daily. Patient taking differently: Take 30 mg by mouth at bedtime. 01/19/20  Yes  Nahser, Wonda Cheng, MD  loperamide (IMODIUM) 2 MG capsule Take 2-4 mg by mouth 4 (four) times daily as needed for diarrhea or loose stools.   Yes [provider]  meclizine (ANTIVERT) 25 MG tablet Take 25 mg by mouth 2 (two) times daily as needed for dizziness.   Yes [provider]  Multiple Vitamin (MULTIVITAMIN WITH MINERALS) TABS tablet Take 1 tablet by mouth daily. Centrum Silver for Women   Yes [provider]  multivitamin-lutein (OCUVITE-LUTEIN) CAPS capsule Take 1 capsule by mouth daily before lunch.    Yes [provider]  nitroGLYCERIN (NITROSTAT) 0.4 MG SL tablet Place 1 tablet (0.4 mg total) under the tongue every 5 (five) minutes x 3 doses as needed for chest pain. 02/02/19  Yes Cheryln Manly, NP  Omega-3 Fatty Acids (FISH OIL PO) Take 1,576 mg by mouth daily before lunch.   Yes [provider]  pantoprazole (PROTONIX) 40 MG tablet Take 1 tablet (40 mg total) by  mouth daily. Take 30 minutes before breakfast 01/12/20  Yes Annitta Needs, NP  Pitavastatin Calcium (LIVALO) 1 MG TABS Take 1 tablet (1 mg total) by mouth daily. 02/02/20  Yes Satira Sark, MD  saccharomyces boulardii (FLORASTOR) 250 MG capsule Take 250 mg by mouth 2 (two) times daily as needed (diarrhea).    Yes [provider]  sodium chloride (OCEAN) 0.65 % SOLN nasal spray Place 1 spray into both nostrils 2 (two) times daily as needed for congestion.    Yes [provider]     Allergies:    Allergies  Allergen Reactions  . Bactrim [Sulfamethoxazole-Trimethoprim] Nausea And Vomiting  . Sulfa Antibiotics Nausea And Vomiting  . Amlodipine Other (See Comments)    EDEMA  . Clonidine Derivatives Other (See Comments)    FATIGUE  . Evista [Raloxifene Hcl] Other (See Comments)    GERD  . Fosamax [Alendronate Sodium] Other (See Comments)    INCREASED GERD   . Glipizide Other (See Comments)    PALPITATIONS  . Lipitor [Atorvastatin Calcium] Other (See  Comments)    ACHING  . Losartan Other (See Comments)    FATIGUE   . Pravastatin Other (See Comments)    ACHING  . Azithromycin Rash and Other (See Comments)    FACIAL BURNING     Social History:   Social History   Socioeconomic History  . Marital status: Divorced    Spouse name: Not on file  . Number of children: Not on file  . Years of education: Not on file  . Highest education level: Not on file  Occupational History  . Not on file  Tobacco Use  . Smoking status: Never Smoker  . Smokeless tobacco: Never Used  Vaping Use  . Vaping Use: Never used  Substance and Sexual Activity  . Alcohol use: Never  . Drug use: Never  . Sexual activity: Not on file  Other Topics Concern  . Not on file  Social History Narrative  . Not on file   Social Determinants of Health   Financial Resource Strain: Not on file  Food Insecurity: Not on file  Transportation Needs: Not on file  Physical Activity: Not on file  Stress: Not on file  Social Connections: Not on file  Intimate Partner Violence: Not on file    Family History:   The patient's family history includes Aneurysm in her sister; Cancer in her sister; Heart attack in her father, maternal uncle, maternal uncle, maternal uncle, paternal aunt, paternal aunt, paternal grandfather, and sister; Heart disease in her maternal uncle, maternal uncle, maternal uncle, paternal aunt, paternal aunt, paternal grandfather, and sister; Stroke in her mother. There is no history of Colon cancer or Colon polyps.    Review of Systems: [y] = yes, [ ]  = no     General: Weight gain [ ] ; Weight loss [ ] ; Anorexia [ ] ; Fatigue [ ] ; Fever [ ] ; Chills [ ] ; Weakness [ ]    Cardiac: Chest pain/pressure [ ] ; Resting SOB Blue.Reese ]; Exertional SOB [ y]; Orthopnea [ ] ; Pedal Edema [ ] ; Palpitations Blue.Reese ]; Syncope [ ] ; Presyncope [ ] ; Paroxysmal nocturnal dyspnea[ ]    Pulmonary: Cough [ ] ; Wheezing[ ] ; Hemoptysis[ ] ; Sputum [ ] ; Snoring [ ]    GI: Vomiting[ ] ;  Dysphagia[ ] ; Melena[ ] ; Hematochezia [ ] ; Heartburn[ ] ; Abdominal pain [ ] ; Constipation [ ] ; Diarrhea [ ] ; BRBPR [ ]    GU: Hematuria[ ] ; Dysuria [ ] ; Nocturia[ ]    Vascular:  Pain in legs with walking [ ] ; Pain in feet with lying flat [ ] ; Non-healing sores [ ] ; Stroke [ ] ; TIA [ ] ; Slurred speech [ ] ;   Neuro: Headaches[ ] ; Vertigo[ ] ; Seizures[ ] ; Paresthesias[ ] ;Blurred vision [ ] ; Diplopia [ ] ; Vision changes [ ]    Ortho/Skin: Arthritis [ ] ; Joint pain [ ] ; Muscle pain [ ] ; Joint swelling [ ] ; Back Pain [ ] ; Rash [ ]    Psych: Depression[ ] ; Anxiety[ ]    Heme: Bleeding problems [ ] ; Clotting disorders [ ] ; Anemia [ ]    Endocrine: Diabetes [ ] ; Thyroid dysfunction[ ]   Physical Exam/Data:   Vitals:   02/13/20 0209 02/13/20 0329 02/13/20 0330 02/13/20 0406  BP: (!) 179/75 (!) 188/85 (!) 189/89   Pulse: 85 99 96   Resp: 18  (!) 25   Temp:      TempSrc:      SpO2: 95% 97% 98%   Weight:    64 kg  Height:    5\' 5"  (1.651 m)   No intake or output data in the 24 hours ending 02/13/20 0418 Filed Weights   02/13/20 0406  Weight: 64 kg   Body mass index is 23.46 kg/m.  General:  Elderly female, short of breath HEENT: normal Lymph: no adenopathy Neck: JVP elevated to mid neck Endocrine:  No thryomegaly Vascular: No carotid bruits; FA pulses 2+ bilaterally without bruits  Cardiac:  normal S1, S2; RRR; no murmur  Lungs: bibasilar crackles Abd: soft, nontender, no hepatomegaly  Ext: 1+ LE edema bilaterally Musculoskeletal:  No deformities, BUE and BLE strength normal and equal Skin: warm and dry  Neuro:  CNs 2-12 intact, no focal abnormalities noted Psych:  Normal affect    EKG:  The ECG that was done  was personally reviewed and demonstrates nonspecific ST and T changes  Relevant CV Studies: [facility] 01/16/2020  Previously placed Mid LAD drug eluting stent is widely patent.  Prox Cx to Mid Cx lesion is 20% stenosed.  Mid RCA lesion is 99% stenosed.  A drug-eluting stent  was successfully placed using a STENT RESOLUTE ONYX 3.5X38, postdilated to > 4 mm and optimized with IVUS. Transient no reflow when the stent was deployed, which resolved with IC verapamil.  Post intervention, there is a 0% residual stenosis.  Dist RCA lesion is 20% stenosed.  LV end diastolic pressure is low.  There is no aortic valve stenosis.    Laboratory Data:  Chemistry Recent Labs  Lab 02/12/20 1726  NA 129*  K 4.7  CL 94*  CO2 22  GLUCOSE 182*  BUN 26*  CREATININE 1.66*  CALCIUM 9.2  GFRNONAA 30*  ANIONGAP 13    No results for input(s): PROT, ALBUMIN, AST, ALT, ALKPHOS, BILITOT in the last 168 hours. Hematology Recent Labs  Lab 02/12/20 1726  WBC 12.1*  RBC 3.67*  HGB 10.2*  HCT 31.6*  MCV 86.1  MCH 27.8  MCHC 32.3  RDW 14.6  PLT 400   Cardiac EnzymesNo results for input(s): TROPONINI in the last 168 hours. No results for input(s): TROPIPOC in the last 168 hours.  BNPNo results for input(s): BNP, PROBNP in the last 168 hours.  DDimer No results for input(s): DDIMER in the last 168 hours.  Radiology/Studies:  DG Chest Portable 1 View  Result Date: 02/12/2020 CLINICAL DATA:  Shortness of breath. EXAM: PORTABLE CHEST 1 VIEW COMPARISON:  January 16, 2020. FINDINGS: Mild diffuse interstitial prominence. Small left and possible trace right pleural effusions. Overlying  left basilar opacities. No visible pneumothorax. Biapical pleuroparenchymal scarring. Mild enlargement the cardiac silhouette. Coronary artery stent. Aortic atherosclerosis. Clips project over the right axilla. IMPRESSION: 1. Mild diffuse interstitial prominence, suggestive of mild interstitial edema. 2. Small left and possible trace right pleural effusions. Overlying left basilar opacities are favored to reflect atelectasis, although pneumonia or aspiration is not excluded. Electronically Signed   By: Margaretha Sheffield MD   On: 02/12/2020 17:58    Assessment and Plan:   1. NSTEMI / CAD -  presentation most concerning for type 1 NSTEMI given history of CAD, strong family history of MI and exertional symptoms with rapidly rising troponin and ongoing dyspnea at rest. I believe this has triggered a mild HF exacerbation and her BP his markedly elevated which is likely contributing to her symptoms. She was loaded with ASA already and I will continue her on clopidogrel (cannot tolerate ticagrelor due to GI bleeding). She was only recently started on pitavastatin for HLD 1. Loaded with ASA, continue 81 mg. Continue plavix (intolerant of ticagrelor). 2. Intolerant to statin therapy. PCSK9 discussed in the past but cost is issue. Just started on pitavastatin 3. Will start heparin gtt 4. No need for echo at this time given recent  2. Acute on chronic HF exacerbation - she is volume up on my exam however I think the biggest driving factor of her dyspnea is coronary disease. She is not massively volume overloaded but could benefit from some diuresis with blood pressure reduction as well. No need for repeat echo at this point. BNP elevated to > 4000 1. IV lasix (given 20 mg in ED, I have given an addition 20 mg within 1 hour) 3. HTN, hypertensive urgency  1. Coreg 25, hydralazine 50, imdur 30 4. HLD 1. Intolerant of all statins formulary statins - recently started on pitavastatin. Attempted to order  5. CKD 1. Stable at this time and sCr lower than baseline 6. Hyponatremia 1. Sodium on admission 129, which is stable from discharge sodium         Severity of Illness: The appropriate patient status for this patient is INPATIENT. Inpatient status is judged to be reasonable and necessary in order to provide the required intensity of service to ensure the patient's safety. The patient's presenting symptoms, physical exam findings, and initial radiographic and laboratory data in the context of their chronic comorbidities is felt to place them at high risk for further clinical deterioration.  Furthermore, it is not anticipated that the patient will be medically stable for discharge from the hospital within 2 midnights of admission. The following factors support the patient status of inpatient.   " The patient's presenting symptoms include shortness of breath. " The worrisome physical exam findings include hypertensive, mild volume overload. " The initial radiographic and laboratory data are worrisome because of elevated troponin. " The chronic co-morbidities include HTN, HLD, CKD, CAD .   * I certify that at the point of admission it is my clinical judgment that the patient will require inpatient hospital care spanning beyond 2 midnights from the point of admission due to high intensity of service, high risk for further deterioration and high frequency of surveillance required.*    For questions or updates, please contact [place] Please consult [email or web address] for contact info under        Signed, Doyne Keel, MD  02/13/2020 4:18 AM

## 2020-02-13 NOTE — Progress Notes (Addendum)
Progress Note  Patient Name: Crystal [name] Date of Encounter: 02/13/2020  [facility] Cardiologist: Rozann Lesches, MD   Subjective   Breathing little better. Worse with any exertion. Sign of volume overload.   Inpatient Medications    Scheduled Meds: . aspirin EC  81 mg Oral q morning - 10a  . calcium-vitamin D  1 tablet Oral QAC lunch  . carvedilol  25 mg Oral BID WC  . cholecalciferol  1,000 Units Oral Q breakfast  . clopidogrel  75 mg Oral q morning - 10a  . ferrous sulfate  325 mg Oral QAC lunch  . hydrALAZINE  25 mg Oral Q8H  . isosorbide mononitrate  30 mg Oral QHS  . NON FORMULARY 1 mg  1 mg Oral Daily  . pantoprazole  40 mg Oral Daily   Continuous Infusions: . heparin 750 Units/hr (02/13/20 0811)   PRN Meds: acetaminophen, nitroGLYCERIN, ondansetron (ZOFRAN) IV   Vital Signs    Vitals:   02/13/20 0615 02/13/20 0636 02/13/20 0700 02/13/20 0715  BP: (!) 167/72 104/83 (!) 156/51 (!) 116/46  Pulse: 88  70 69  Resp: 16  16 15   Temp:      TempSrc:      SpO2: 97%  92% 92%  Weight:      Height:       No intake or output data in the 24 hours ending 02/13/20 0837 Last 3 Weights 02/13/2020 01/26/2020 01/18/2020  Weight (lbs) 141 lb 138 lb 3.2 oz 139 lb 15.9 oz  Weight (kg) 63.957 kg 62.687 kg 63.5 kg      Telemetry    SR with PVCS- Personally Reviewed  ECG    SR - Personally Reviewed  Physical Exam   GEN: No acute distress.   Neck:1+  JVD Cardiac: RRR, no murmurs, rubs, or gallops.  Respiratory: Clear to auscultation bilaterally. GI: Soft, nontender, non-distended  MS: 1-2+ BL LE edema; No deformity. Neuro:  Nonfocal  Psych: Normal affect   Labs    High Sensitivity Troponin:   Recent Labs  Lab 01/16/20 0510 01/16/20 0804 01/16/20 1003 02/12/20 1726 02/12/20 2300  TROPONINIHS 741* 675* 614* 254* 1,735*      Chemistry Recent Labs  Lab 02/12/20 1726  NA 129*  K 4.7  CL 94*  CO2 22  GLUCOSE 182*  BUN 26*  CREATININE  1.66*  CALCIUM 9.2  GFRNONAA 30*  ANIONGAP 13     Hematology Recent Labs  Lab 02/12/20 1726  WBC 12.1*  RBC 3.67*  HGB 10.2*  HCT 31.6*  MCV 86.1  MCH 27.8  MCHC 32.3  RDW 14.6  PLT 400   BNP Recent Labs  Lab 02/13/20 0355  BNP 4,423.6*     Radiology    DG Chest Portable 1 View  Result Date: 02/12/2020 CLINICAL DATA:  Shortness of breath. EXAM: PORTABLE CHEST 1 VIEW COMPARISON:  January 16, 2020. FINDINGS: Mild diffuse interstitial prominence. Small left and possible trace right pleural effusions. Overlying left basilar opacities. No visible pneumothorax. Biapical pleuroparenchymal scarring. Mild enlargement the cardiac silhouette. Coronary artery stent. Aortic atherosclerosis. Clips project over the right axilla. IMPRESSION: 1. Mild diffuse interstitial prominence, suggestive of mild interstitial edema. 2. Small left and possible trace right pleural effusions. Overlying left basilar opacities are favored to reflect atelectasis, although pneumonia or aspiration is not excluded. Electronically Signed   By: Margaretha Sheffield MD   On: 02/12/2020 17:58   Cardiac Studies   Echo 12/2019 1. Left ventricular ejection fraction,  by estimation, is 50 to 55%. The  left ventricle has low normal function. The left ventricle demonstrates  regional wall motion abnormalities (see scoring diagram/findings for  description). There is moderate left  ventricular hypertrophy. Left ventricular diastolic parameters are  consistent with Grade I diastolic dysfunction (impaired relaxation).  Elevated left ventricular end-diastolic pressure. The E/e' is 85. There is  moderate hypokinesis of the left  ventricular, basal-mid inferior wall.  2. Right ventricular systolic function is hyperdynamic. The right  ventricular size is normal. There is normal pulmonary artery systolic  pressure.  3. Left atrial size was moderately dilated.  4. The mitral valve is abnormal. Trivial mitral valve  regurgitation.  5. The aortic valve is tricuspid. Aortic valve regurgitation is not  visualized.  6. The inferior vena cava is normal in size with <50% respiratory  variability, suggesting right atrial pressure of 8 mmHg.   Patient Profile     86 y.o. female with hx of CAD s/p DES to mLAD 01/2019 and recent inferior STEMI s/p DES to [facility] 12/2019, ICM with improved LVEF (50-55% EF by echo 12/2019), HLD (statin intolerance), HLD and CKD III presented with few weeks hx of DOE, worsen in last day who found to have elevated enzyme and fluid marker.   Assessment & Plan    1. NSTEMI - Presented with few weeks hx of DOE, worsen in last day. Also has L side jaw pain. With inferior STEMI had pain between shoulder pain.  - Hs-troponin 254>>1735. Will cycle troponin.  - On DAPT with ASA and Plavix. Did not tolerated Brillinta in past due to GI bleed.  - Continue heparin.  - Likely L & R cath>> MD to see - Continue Coreg and Imdur  2. Chronic systolic CHF - Most recently echo with stable LVEF at 50-55% and grade 1 DD - BNP > 4000 - Evidence of volume overload by exam - Got IV lasix 20mg  x 2>> reports minimal improved brathing - Likely causing some of her symptoms  3. HTN - Elevated on arrival, now stable - Continue current medications  4. HLD - 04/13/2019: Cholesterol 155; HDL 56; LDL Cholesterol 77; VLDL 22 01/17/2020: Triglycerides 156  -Statins intolerance. Can't afford PCSK9 inhibitor.  Rcently started on pitavastatin.  5.CKD III - Baseline Scr around 1.3-1.6. - Scr 1.66 yesterday. Repeat today  For questions or updates, please contact [place] Please consult [email or web address] for contact info under        Jarrett Soho, PA  02/13/2020, 8:37 AM     Patient seen and examined. Agree with assessment and plan. Prior caths reviewed. Pt is s/p stenting to LAD 01/2019 and mid RCA 12/2019 with repeat cath post RCA stent due to ST elevation post PCI showing patent LAD and  RCA stents.  Over the past several weeks the patient has experienced some increasing shortness of breath and blood pressure elevation.  Medications have been changed and hydralazine was added for improved blood pressure control.  She presented to the emergency room yesterday increasing shortness of breath and leg swelling.  Initially she was significantly hypertensive blood pressure 216/105.  Troponins are positive consistent with a non-STEMI.  BNP greater than 4000 consistent with CHF.  As noted above  right and left heart cardiac catheterization has been discussed with patient.  Patient is aware of risk benefits.  Plan for definitive restudy today.   Troy Sine, MD, [facility] 02/13/2020 9:42 AM

## 2020-02-13 NOTE — H&P (View-Only) (Signed)
Progress Note  Patient Name: Crystal [name] Date of Encounter: 02/13/2020  [facility] Cardiologist: Rozann Lesches, MD   Subjective   Breathing little better. Worse with any exertion. Sign of volume overload.   Inpatient Medications    Scheduled Meds: . aspirin EC  81 mg Oral q morning - 10a  . calcium-vitamin D  1 tablet Oral QAC lunch  . carvedilol  25 mg Oral BID WC  . cholecalciferol  1,000 Units Oral Q breakfast  . clopidogrel  75 mg Oral q morning - 10a  . ferrous sulfate  325 mg Oral QAC lunch  . hydrALAZINE  25 mg Oral Q8H  . isosorbide mononitrate  30 mg Oral QHS  . NON FORMULARY 1 mg  1 mg Oral Daily  . pantoprazole  40 mg Oral Daily   Continuous Infusions: . heparin 750 Units/hr (02/13/20 0811)   PRN Meds: acetaminophen, nitroGLYCERIN, ondansetron (ZOFRAN) IV   Vital Signs    Vitals:   02/13/20 0615 02/13/20 0636 02/13/20 0700 02/13/20 0715  BP: (!) 167/72 104/83 (!) 156/51 (!) 116/46  Pulse: 88  70 69  Resp: 16  16 15   Temp:      TempSrc:      SpO2: 97%  92% 92%  Weight:      Height:       No intake or output data in the 24 hours ending 02/13/20 0837 Last 3 Weights 02/13/2020 01/26/2020 01/18/2020  Weight (lbs) 141 lb 138 lb 3.2 oz 139 lb 15.9 oz  Weight (kg) 63.957 kg 62.687 kg 63.5 kg      Telemetry    SR with PVCS- Personally Reviewed  ECG    SR - Personally Reviewed  Physical Exam   GEN: No acute distress.   Neck:1+  JVD Cardiac: RRR, no murmurs, rubs, or gallops.  Respiratory: Clear to auscultation bilaterally. GI: Soft, nontender, non-distended  MS: 1-2+ BL LE edema; No deformity. Neuro:  Nonfocal  Psych: Normal affect   Labs    High Sensitivity Troponin:   Recent Labs  Lab 01/16/20 0510 01/16/20 0804 01/16/20 1003 02/12/20 1726 02/12/20 2300  TROPONINIHS 741* 675* 614* 254* 1,735*      Chemistry Recent Labs  Lab 02/12/20 1726  NA 129*  K 4.7  CL 94*  CO2 22  GLUCOSE 182*  BUN 26*  CREATININE  1.66*  CALCIUM 9.2  GFRNONAA 30*  ANIONGAP 13     Hematology Recent Labs  Lab 02/12/20 1726  WBC 12.1*  RBC 3.67*  HGB 10.2*  HCT 31.6*  MCV 86.1  MCH 27.8  MCHC 32.3  RDW 14.6  PLT 400   BNP Recent Labs  Lab 02/13/20 0355  BNP 4,423.6*     Radiology    DG Chest Portable 1 View  Result Date: 02/12/2020 CLINICAL DATA:  Shortness of breath. EXAM: PORTABLE CHEST 1 VIEW COMPARISON:  January 16, 2020. FINDINGS: Mild diffuse interstitial prominence. Small left and possible trace right pleural effusions. Overlying left basilar opacities. No visible pneumothorax. Biapical pleuroparenchymal scarring. Mild enlargement the cardiac silhouette. Coronary artery stent. Aortic atherosclerosis. Clips project over the right axilla. IMPRESSION: 1. Mild diffuse interstitial prominence, suggestive of mild interstitial edema. 2. Small left and possible trace right pleural effusions. Overlying left basilar opacities are favored to reflect atelectasis, although pneumonia or aspiration is not excluded. Electronically Signed   By: Margaretha Sheffield MD   On: 02/12/2020 17:58   Cardiac Studies   Echo 12/2019 1. Left ventricular ejection fraction,  by estimation, is 50 to 55%. The  left ventricle has low normal function. The left ventricle demonstrates  regional wall motion abnormalities (see scoring diagram/findings for  description). There is moderate left  ventricular hypertrophy. Left ventricular diastolic parameters are  consistent with Grade I diastolic dysfunction (impaired relaxation).  Elevated left ventricular end-diastolic pressure. The E/e' is 50. There is  moderate hypokinesis of the left  ventricular, basal-mid inferior wall.  2. Right ventricular systolic function is hyperdynamic. The right  ventricular size is normal. There is normal pulmonary artery systolic  pressure.  3. Left atrial size was moderately dilated.  4. The mitral valve is abnormal. Trivial mitral valve  regurgitation.  5. The aortic valve is tricuspid. Aortic valve regurgitation is not  visualized.  6. The inferior vena cava is normal in size with <50% respiratory  variability, suggesting right atrial pressure of 8 mmHg.   Patient Profile     86 y.o. female with hx of CAD s/p DES to mLAD 01/2019 and recent inferior STEMI s/p DES to [facility] 12/2019, ICM with improved LVEF (50-55% EF by echo 12/2019), HLD (statin intolerance), HLD and CKD III presented with few weeks hx of DOE, worsen in last day who found to have elevated enzyme and fluid marker.   Assessment & Plan    1. NSTEMI - Presented with few weeks hx of DOE, worsen in last day. Also has L side jaw pain. With inferior STEMI had pain between shoulder pain.  - Hs-troponin 254>>1735. Will cycle troponin.  - On DAPT with ASA and Plavix. Did not tolerated Brillinta in past due to GI bleed.  - Continue heparin.  - Likely L & R cath>> MD to see - Continue Coreg and Imdur  2. Chronic systolic CHF - Most recently echo with stable LVEF at 50-55% and grade 1 DD - BNP > 4000 - Evidence of volume overload by exam - Got IV lasix 20mg  x 2>> reports minimal improved brathing - Likely causing some of her symptoms  3. HTN - Elevated on arrival, now stable - Continue current medications  4. HLD - 04/13/2019: Cholesterol 155; HDL 56; LDL Cholesterol 77; VLDL 22 01/17/2020: Triglycerides 156  -Statins intolerance. Can't afford PCSK9 inhibitor.  Rcently started on pitavastatin.  5.CKD III - Baseline Scr around 1.3-1.6. - Scr 1.66 yesterday. Repeat today  For questions or updates, please contact [place] Please consult [email or web address] for contact info under        Jarrett Soho, PA  02/13/2020, 8:37 AM     Patient seen and examined. Agree with assessment and plan. Prior caths reviewed. Pt is s/p stenting to LAD 01/2019 and mid RCA 12/2019 with repeat cath post RCA stent due to ST elevation post PCI showing patent LAD and  RCA stents.  Over the past several weeks the patient has experienced some increasing shortness of breath and blood pressure elevation.  Medications have been changed and hydralazine was added for improved blood pressure control.  She presented to the emergency room yesterday increasing shortness of breath and leg swelling.  Initially she was significantly hypertensive blood pressure 216/105 moved with RP.  Troponins are positive consistent with a non-STEMI.  BNP greater than 4000 consistent with CHF.  As noted above  right and left heart cardiac catheterization has been discussed with patient.  Patient is aware of risk benefits.  Plan for definitive restudy today.   Troy Sine, MD, [facility] 02/13/2020 9:42 AM

## 2020-02-13 NOTE — Interval H&P Note (Signed)
History and Physical Interval Note:  02/13/2020 2:02 PM  Kamas  has presented today for surgery, with the diagnosis of nstemi.  The various methods of treatment have been discussed with the patient and family. After consideration of risks, benefits and other options for treatment, the patient has consented to  Procedure(s): LEFT HEART CATH AND CORONARY ANGIOGRAPHY (N/A) as a surgical intervention.  The patient's history has been reviewed, patient examined, no change in status, stable for surgery.  I have reviewed the patient's chart and labs.  Questions were answered to the patient's satisfaction.   Cath Lab Visit (complete for each Cath Lab visit)  Clinical Evaluation Leading to the Procedure:   ACS: Yes.    Non-ACS:    Anginal Classification: CCS III  Anti-ischemic medical therapy: Maximal Therapy (2 or more classes of medications)  Non-Invasive Test Results: No non-invasive testing performed  Prior CABG: No previous CABG        Collier Salina [facility] 02/13/2020 2:02 PM

## 2020-02-13 NOTE — ED Provider Notes (Signed)
[facility] Emergency Department Provider Note MRN:  [id number]  Arrival date & time: 02/13/20     Chief Complaint   Shortness of Breath   History of Present Illness   Crystal [name] is a 86 y.o. year-old female with a history of CAD, CHF, diabetes presenting to the ED with chief complaint of shortness of breath.  Gradual onset shortness of breath slowly worsening since yesterday morning.  Worse with exertion or trying to ambulate.  Denies chest pain.  Feels a fluttering in the chest occasionally with the symptoms.  Denies dizziness or diaphoresis, no nausea vomiting or diarrhea, no recent fever or cough, no abdominal pain.  Review of Systems  A complete 10 system review of systems was obtained and all systems are negative except as noted in the HPI and PMH.   Patient's Health History    Past Medical History:  Diagnosis Date  . Bell's palsy   . Breast cancer ([place]) 1998   Right mastectomy  . CAD (coronary artery disease)    a. s/p STEMI in 01/2019 with DES to mid-LAD  . CHF (congestive heart failure) ([place])    a. EF 30-35% by echo in 01/2019  . Essential hypertension   . GERD (gastroesophageal reflux disease)   . Gout   . History of skin cancer    Squamous cell, left shoulder  . Mixed hyperlipidemia   . Osteopenia   . Reflux esophagitis   . Thyroid nodule   . Type 2 diabetes mellitus ([place])     Past Surgical History:  Procedure Laterality Date  . BIOPSY  10/13/2019   Procedure: BIOPSY;  Surgeon: Daneil Dolin, MD;  [facility];  Service: Endoscopy;;  . CATARACT EXTRACTION  2016  . CORONARY ANGIOGRAPHY N/A 01/16/2020   Procedure: CORONARY ANGIOGRAPHY;  Surgeon: Jettie Booze, MD;  Location: [place] [facility];  Service: Cardiovascular;  Laterality: N/A;  . CORONARY STENT INTERVENTION N/A 01/16/2020   Procedure: CORONARY STENT INTERVENTION;  Surgeon: Jettie Booze, MD;  Location: [place] [facility];  Service:  Cardiovascular;  Laterality: N/A;  . CORONARY/GRAFT ACUTE MI REVASCULARIZATION N/A 01/30/2019   Procedure: Coronary/Graft Acute MI Revascularization;  Surgeon: Burnell Blanks, MD;  Location: [place] [facility];  Service: Cardiovascular;  Laterality: N/A;  . ESOPHAGOGASTRODUODENOSCOPY (EGD) WITH PROPOFOL N/A 10/13/2019   Non-obstructing Schatzki ring at GE junction, s/p dilation, erosive gastropathy with stigmata of recent bleeding, normal duodenum. Negative H.pylori.   Marland Kitchen HYSTEROSCOPY    . INTRAVASCULAR ULTRASOUND/IVUS N/A 01/16/2020   Procedure: Intravascular Ultrasound/IVUS;  Surgeon: Jettie Booze, MD;  Location: [place] [facility];  Service: Cardiovascular;  Laterality: N/A;  . LEFT HEART CATH AND CORONARY ANGIOGRAPHY N/A 01/30/2019   Procedure: LEFT HEART CATH AND CORONARY ANGIOGRAPHY;  Surgeon: Burnell Blanks, MD;  Location: [place] [facility];  Service: Cardiovascular;  Laterality: N/A;  . LEFT HEART CATH AND CORONARY ANGIOGRAPHY N/A 01/16/2020   Procedure: LEFT HEART CATH AND CORONARY ANGIOGRAPHY;  Surgeon: Jettie Booze, MD;  Location: [place] [facility];  Service: Cardiovascular;  Laterality: N/A;  . Venia Minks DILATION N/A 10/13/2019   Procedure: Venia Minks DILATION;  Surgeon: Daneil Dolin, MD;  [facility];  Service: Endoscopy;  Laterality: N/A;  . Right mastectomy  1998   [place]    Family History  Problem Relation Age of Onset  . Stroke Mother   . Heart attack Father   . Aneurysm Sister   . Heart  attack Maternal Uncle   . Heart disease Maternal Uncle   . Heart attack Paternal Aunt   . Heart disease Paternal Aunt   . Heart attack Paternal Grandfather   . Heart disease Paternal Grandfather   . Heart attack Paternal Aunt   . Heart disease Paternal Aunt   . Heart attack Maternal Uncle   . Heart disease Maternal Uncle   . Heart attack Maternal Uncle   . Heart disease Maternal Uncle   . Heart attack Sister   . Heart disease Sister   .  Cancer Sister   . Colon cancer Neg Hx   . Colon polyps Neg Hx     Social History   Socioeconomic History  . Marital status: Divorced    Spouse name: Not on file  . Number of children: Not on file  . Years of education: Not on file  . Highest education level: Not on file  Occupational History  . Not on file  Tobacco Use  . Smoking status: Never Smoker  . Smokeless tobacco: Never Used  Vaping Use  . Vaping Use: Never used  Substance and Sexual Activity  . Alcohol use: Never  . Drug use: Never  . Sexual activity: Not on file  Other Topics Concern  . Not on file  Social History Narrative  . Not on file   Social Determinants of Health   Financial Resource Strain: Not on file  Food Insecurity: Not on file  Transportation Needs: Not on file  Physical Activity: Not on file  Stress: Not on file  Social Connections: Not on file  Intimate Partner Violence: Not on file     Physical Exam   Vitals:   02/13/20 0329 02/13/20 0330  BP: (!) 188/85 (!) 189/89  Pulse: 99 96  Resp:  (!) 25  Temp:    SpO2: 97% 98%    CONSTITUTIONAL: Well-appearing, NAD NEURO:  Alert and oriented x 3, no focal deficits EYES:  eyes equal and reactive ENT/NECK:  no LAD, no JVD CARDIO: Regular rate, well-perfused, normal S1 and S2 PULM:  CTAB no wheezing or rhonchi GI/GU:  normal bowel sounds, non-distended, non-tender MSK/SPINE:  No gross deformities, no edema SKIN:  no rash, atraumatic PSYCH:  Appropriate speech and behavior  *Additional and/or pertinent findings included in MDM below  Diagnostic and Interventional Summary    EKG Interpretation  Date/Time:  Monday February 13 2020 03:39:31 EST Ventricular Rate:  91 PR Interval:    QRS Duration: 83 QT Interval:  372 QTC Calculation: 458 R Axis:   49 Text Interpretation: Sinus rhythm Anteroseptal infarct, age indeterminate Confirmed by Gerlene Fee [phone]) on 02/13/2020 3:42:57 AM      Labs Reviewed  BASIC METABOLIC PANEL -  Abnormal; Notable for the following components:      Result Value   Sodium 129 (*)    Chloride 94 (*)    Glucose, Bld 182 (*)    BUN 26 (*)    Creatinine, Ser 1.66 (*)    GFR, Estimated 30 (*)    All other components within normal limits  CBC - Abnormal; Notable for the following components:   WBC 12.1 (*)    RBC 3.67 (*)    Hemoglobin 10.2 (*)    HCT 31.6 (*)    All other components within normal limits  TROPONIN I (HIGH SENSITIVITY) - Abnormal; Notable for the following components:   Troponin I (High Sensitivity) 254 (*)    All other components within normal limits  TROPONIN I (HIGH SENSITIVITY) - Abnormal; Notable for the following components:   Troponin I (High Sensitivity) 1,735 (*)    All other components within normal limits  RESP PANEL BY RT-PCR (FLU A&B, COVID) [id number]  BRAIN NATRIURETIC PEPTIDE    DG Chest Portable 1 View  Final Result      Medications  furosemide (LASIX) injection 20 mg (has no administration in time range)  aspirin chewable tablet 324 mg (has no administration in time range)     Procedures  /  Critical Care .Critical Care Performed by: Maudie Flakes, MD Authorized by: Maudie Flakes, MD   Critical care provider statement:    Critical care time (minutes):  31   Critical care was necessary to treat or prevent imminent or life-threatening deterioration of the following conditions: NSTEMI.   Critical care was time spent personally by me on the following activities:  Discussions with consultants, evaluation of patient's response to treatment, examination of patient, ordering and performing treatments and interventions, ordering and review of laboratory studies, ordering and review of radiographic studies, pulse oximetry, re-evaluation of patient's condition, obtaining history from patient or surrogate and review of old charts    ED Course and Medical Decision Making  I have reviewed the triage vital signs, the nursing notes, and pertinent  available records from the EMR.  Listed above are laboratory and imaging tests that I personally ordered, reviewed, and interpreted and then considered in my medical decision making (see below for details).  Shortness of breath, some lower extremity edema noted, patient endorses that this is worsening.  Slowly worsening shortness of breath today.  No chest pain.  Patient had an extended wait in the waiting room during which her troponin rose from 200s to 1200.  Currently still without chest pain, feels mildly short of breath but is without any increased work of breathing, vital signs are overall reassuring, she is hypertensive.  Favoring type II NSTEMI in the setting of CHF exacerbation, will consult cardiology for admission given that she had a recent NSTEMI and catheterization last month.       Barth Kirks. [place]onia Small, [place] [email or web address]  Final Clinical Impressions(s) / ED Diagnoses     ICD-10-CM   1. NSTEMI (non-ST elevated myocardial infarction) ([place])  I21.4   2. Acute on chronic congestive heart failure, unspecified heart failure type ([place])  I50.9     ED Discharge Orders    None       Discharge Instructions Discussed with and Provided to Patient:   Discharge Instructions   None       Maudie Flakes, MD 02/13/20 [phone]

## 2020-02-14 DIAGNOSIS — I214 Non-ST elevation (NSTEMI) myocardial infarction: Secondary | ICD-10-CM | POA: Diagnosis not present

## 2020-02-14 LAB — CBC
HCT: 24.6 % — ABNORMAL LOW (ref 36.0–46.0)
Hemoglobin: 8.5 g/dL — ABNORMAL LOW (ref 12.0–15.0)
MCH: 28.6 pg (ref 26.0–34.0)
MCHC: 34.6 g/dL (ref 30.0–36.0)
MCV: 82.8 fL (ref 80.0–100.0)
Platelets: 299 10*3/uL (ref 150–400)
RBC: 2.97 MIL/uL — ABNORMAL LOW (ref 3.87–5.11)
RDW: 14.8 % (ref 11.5–15.5)
WBC: 10.3 10*3/uL (ref 4.0–10.5)
nRBC: 0 % (ref 0.0–0.2)

## 2020-02-14 LAB — BASIC METABOLIC PANEL
Anion gap: 11 (ref 5–15)
BUN: 30 mg/dL — ABNORMAL HIGH (ref 8–23)
CO2: 22 mmol/L (ref 22–32)
Calcium: 8.6 mg/dL — ABNORMAL LOW (ref 8.9–10.3)
Chloride: 97 mmol/L — ABNORMAL LOW (ref 98–111)
Creatinine, Ser: 1.59 mg/dL — ABNORMAL HIGH (ref 0.44–1.00)
GFR, Estimated: 32 mL/min — ABNORMAL LOW (ref >60)
Glucose, Bld: 173 mg/dL — ABNORMAL HIGH (ref 70–99)
Potassium: 4.3 mmol/L (ref 3.5–5.1)
Sodium: 130 mmol/L — ABNORMAL LOW (ref 135–145)

## 2020-02-14 LAB — LIPID PANEL
Cholesterol: 155 mg/dL (ref 0–200)
HDL: 57 mg/dL (ref >40)
LDL Cholesterol: 83 mg/dL (ref 0–99)
Total CHOL/HDL Ratio: 2.7 RATIO
Triglycerides: 74 mg/dL (ref <150)
VLDL: 15 mg/dL (ref 0–40)

## 2020-02-14 LAB — RETICULOCYTES
Immature Retic Fract: 18.6 % — ABNORMAL HIGH (ref 2.3–15.9)
RBC.: 2.98 MIL/uL — ABNORMAL LOW (ref 3.87–5.11)
Retic Count, Absolute: 82.5 10*3/uL (ref 19.0–186.0)
Retic Ct Pct: 2.8 % (ref 0.4–3.1)

## 2020-02-14 LAB — FERRITIN: Ferritin: 79 ng/mL (ref 11–307)

## 2020-02-14 LAB — IRON AND TIBC
Iron: 13 ug/dL — ABNORMAL LOW (ref 28–170)
Saturation Ratios: 4 % — ABNORMAL LOW (ref 10.4–31.8)
TIBC: 308 ug/dL (ref 250–450)
UIBC: 295 ug/dL

## 2020-02-14 LAB — PROTIME-INR
INR: 1.1 (ref 0.8–1.2)
Prothrombin Time: 14.2 seconds (ref 11.4–15.2)

## 2020-02-14 LAB — TSH: TSH: 2.238 u[IU]/mL (ref 0.350–4.500)

## 2020-02-14 LAB — OCCULT BLOOD X 1 CARD TO LAB, STOOL: Fecal Occult Bld: POSITIVE — AB

## 2020-02-14 LAB — FOLATE: Folate: 31.2 ng/mL (ref >5.9)

## 2020-02-14 LAB — VITAMIN B12: Vitamin B-12: 916 pg/mL — ABNORMAL HIGH (ref 180–914)

## 2020-02-14 MED ORDER — FUROSEMIDE 10 MG/ML IJ SOLN
40.0000 mg | Freq: Once | INTRAMUSCULAR | Status: AC
  Administered 2020-02-14: 40 mg via INTRAVENOUS
  Filled 2020-02-14: qty 4

## 2020-02-14 MED ORDER — BISACODYL 5 MG PO TBEC: 5.0000 mg | DELAYED_RELEASE_TABLET | Freq: Every day | ORAL | Status: DC | PRN

## 2020-02-14 MED ORDER — SODIUM CHLORIDE 0.9 % IV SOLN
510.0000 mg | Freq: Once | INTRAVENOUS | Status: AC
  Administered 2020-02-14: 510 mg via INTRAVENOUS
  Filled 2020-02-14: qty 17

## 2020-02-14 MED ORDER — ROSUVASTATIN CALCIUM 5 MG PO TABS
2.5000 mg | ORAL_TABLET | ORAL | Status: DC
Start: 2020-02-14 — End: 2020-03-04
  Administered 2020-02-14 – 2020-03-03 (×10): 2.5 mg via ORAL
  Filled 2020-02-14 (×15): qty 1

## 2020-02-14 MED ORDER — POTASSIUM CHLORIDE CRYS ER 20 MEQ PO TBCR
40.0000 meq | EXTENDED_RELEASE_TABLET | Freq: Once | ORAL | Status: AC
  Administered 2020-02-14: 40 meq via ORAL
  Filled 2020-02-14: qty 2

## 2020-02-14 NOTE — Progress Notes (Signed)
  Mobility Specialist Criteria Algorithm Info.  SATURATION QUALIFICATIONS: (This note is used to comply with regulatory documentation for home oxygen)  Patient Saturations on Room Air at Rest = 95%  Patient Saturations on Room Air while Ambulating = 95%  Patient Saturations on 0 Liters of oxygen while Ambulating = n/a%  Please briefly explain why patient needs home oxygen:  Mobility Team:  [facility] elevated:Self regulated Activity: Ambulated in hall; Dangled on edge of bed Range of motion: Active; All extremities Level of assistance: Standby assist, set-up cues, supervision of patient - no hands on Assistive device: None Minutes sitting in chair:  Minutes stood: 3 minutes Minutes ambulated: 3 minutes Distance ambulated (ft): 40 ft Mobility response: Tolerated fair; RN notified Bed Position: Semi-fowlers  Patient agreeable to participate in mobility. Per report from pt she is independent with ADL's and doesn't use any assistive devices for ambulation. She sat EOB supine<sit independently and reported feeling discomfort in chest/shoulder with deep inspiration breathing. Regardless of pain pt agreed to continue on with ambulation. She stood and ambulated in hallway 40 feet at min guard. Declined any chest pain but complained of SOB. Could only tolerate short distance, limited secondary to fatigue and dyspnea. Oxygen saturation >95% throughout. She dangled EOB and requested pain medication upon returning to room. Tolerated ambulation fair with complaints only of SOB and is now dangling EOB with all needs met.   02/14/2020 2:38 PM

## 2020-02-14 NOTE — Progress Notes (Addendum)
Progress Note  Patient Name: Crystal [name] Date of Encounter: 02/14/2020  [facility] Cardiologist: Rozann Lesches, MD   Subjective   Feels tightness in her chest along her shoulders, still short of breath.   Inpatient Medications    Scheduled Meds: . aspirin EC  81 mg Oral q morning - 10a  . calcium-vitamin D  1 tablet Oral QAC lunch  . carvedilol  25 mg Oral BID WC  . cholecalciferol  1,000 Units Oral Q breakfast  . clopidogrel  75 mg Oral q morning - 10a  . enoxaparin (LOVENOX) injection  30 mg Subcutaneous Q24H  . ferrous sulfate  325 mg Oral QAC lunch  . furosemide  40 mg Intravenous Once  . hydrALAZINE  25 mg Oral Q8H  . isosorbide mononitrate  30 mg Oral QHS  . NON FORMULARY 1 mg  1 mg Oral Daily  . pantoprazole  40 mg Oral Daily  . potassium chloride  40 mEq Oral Once  . sodium chloride flush  3 mL Intravenous Q12H  . sodium chloride flush  3 mL Intravenous Q12H   Continuous Infusions: . sodium chloride     PRN Meds: sodium chloride, acetaminophen, nitroGLYCERIN, ondansetron (ZOFRAN) IV   Vital Signs    Vitals:   02/13/20 1519 02/13/20 1632 02/13/20 2044 02/14/20 0500  BP: (!) 155/65 (!) 131/58 (!) 132/58 (!) 176/70  Pulse: 70 78 76 80  Resp: 16  16 18   Temp:   97.9 F (36.6 C) 99.3 F (37.4 C)  TempSrc:   Oral Oral  SpO2: 94% 97% 96% 95%  Weight:    78.1 kg  Height:        Intake/Output Summary (Last 24 hours) at 02/14/2020 0807 Last data filed at 02/14/2020 0500 Gross per 24 hour  Intake 476.23 ml  Output 250 ml  Net 226.23 ml   Last 3 Weights 02/14/2020 02/13/2020 01/26/2020  Weight (lbs) 172 lb 1.6 oz 141 lb 138 lb 3.2 oz  Weight (kg) 78.064 kg 63.957 kg 62.687 kg      Telemetry    SR - Personally Reviewed  ECG    No new tracing this morning.   Physical Exam  Pleasant older female, laying in bed. GEN: No acute distress.   Neck: No JVD Cardiac: RRR, no murmurs, rubs, or gallops.  Respiratory: Clear to auscultation  bilaterally. GI: Soft, nontender, non-distended  MS: 1+ LE edema bilaterally; No deformity. Right radial cath site stable. Neuro:  Nonfocal  Psych: Normal affect   Labs    High Sensitivity Troponin:   Recent Labs  Lab 01/16/20 1003 02/12/20 1726 02/12/20 2300 02/13/20 1126 02/13/20 1330  TROPONINIHS 614* 254* 1,735* 1,229* 1,055*      Chemistry Recent Labs  Lab 02/12/20 1726 02/13/20 1126 02/13/20 1428 02/13/20 1431 02/13/20 1437  NA 129* 134* 133* 133* 132*  K 4.7 3.3* 3.4* 3.4* 3.3*  CL 94* 96*  --   --   --   CO2 22 24  --   --   --   GLUCOSE 182* 120*  --   --   --   BUN 26* 24*  --   --   --   CREATININE 1.66* 1.29*  --   --   --   CALCIUM 9.2 9.1  --   --   --   GFRNONAA 30* 41*  --   --   --   ANIONGAP 13 14  --   --   --  Hematology Recent Labs  Lab 02/12/20 1726 02/13/20 1428 02/13/20 1431 02/13/20 1437 02/14/20 0339  WBC 12.1*  --   --   --  10.3  RBC 3.67*  --   --   --  2.97*  HGB 10.2*   < > 8.8* 8.2* 8.5*  HCT 31.6*   < > 26.0* 24.0* 24.6*  MCV 86.1  --   --   --  82.8  MCH 27.8  --   --   --  28.6  MCHC 32.3  --   --   --  34.6  RDW 14.6  --   --   --  14.8  PLT 400  --   --   --  299   < > = values in this interval not displayed.    BNP Recent Labs  Lab 02/13/20 0355  BNP 4,423.6*     DDimer No results for input(s): DDIMER in the last 168 hours.   Radiology    CARDIAC CATHETERIZATION  Result Date: 02/13/2020  Non-stenotic Mid LAD lesion was previously treated.  Prox Cx to Mid Cx lesion is 20% stenosed.  Dist RCA lesion is 20% stenosed.  Non-stenotic Mid RCA lesion was previously treated.  Hemodynamic findings consistent with mild pulmonary hypertension.  LV end diastolic pressure is mildly elevated.  1. Nonobstructive CAD. Continued excellent patency of stents in the LAD and RCA 2. Mildly elevated LV filling pressures 3. Mildly elevated right heart pressures. 4. Normal cardiac output Plan: continue medical therapy.  Diuresis. BP control.   DG Chest Portable 1 View  Result Date: 02/12/2020 CLINICAL DATA:  Shortness of breath. EXAM: PORTABLE CHEST 1 VIEW COMPARISON:  January 16, 2020. FINDINGS: Mild diffuse interstitial prominence. Small left and possible trace right pleural effusions. Overlying left basilar opacities. No visible pneumothorax. Biapical pleuroparenchymal scarring. Mild enlargement the cardiac silhouette. Coronary artery stent. Aortic atherosclerosis. Clips project over the right axilla. IMPRESSION: 1. Mild diffuse interstitial prominence, suggestive of mild interstitial edema. 2. Small left and possible trace right pleural effusions. Overlying left basilar opacities are favored to reflect atelectasis, although pneumonia or aspiration is not excluded. Electronically Signed   By: Margaretha Sheffield MD   On: 02/12/2020 17:58    Cardiac Studies   Cath: 02/13/20   Non-stenotic Mid LAD lesion was previously treated.  Prox Cx to Mid Cx lesion is 20% stenosed.  Dist RCA lesion is 20% stenosed.  Non-stenotic Mid RCA lesion was previously treated.  Hemodynamic findings consistent with mild pulmonary hypertension.  LV end diastolic pressure is mildly elevated.   1. Nonobstructive CAD. Continued excellent patency of stents in the LAD and RCA 2. Mildly elevated LV filling pressures 3. Mildly elevated right heart pressures. 4. Normal cardiac output  Plan: continue medical therapy. Diuresis. BP control.  Diagnostic Dominance: Right     Patient Profile     86 y.o. female with hx of CAD s/p DES to mLAD 01/2019 and recent inferior STEMI s/p DES to [facility] 12/2019, ICM with improved LVEF (50-55% EF by echo 12/2019), HLD (statin intolerance), HLD and CKD III presented with few weeks hx of DOE, worsen in last day who found to have elevated enzyme and BNP.  Assessment & Plan    1. NSTEMI: presented with dyspnea on exertion, worse the day prior to admission. hsTn peaked at 1735. Underwent cardiac  cath noted above with patent stents in the RCA and LAD. Recommendation to continue medical therapy on ASA and plavix.  -- continue BB, Imdur  2. Chronic systolic CHF: echo with stable EF at 50-55% and G1DD.BNP >4000. Elevated filling pressures on cath. Still feeling short of breath this morning.  -- give IV lasix 40mg  x1 and follow up UOP. She has not had much output thus far. Weights are not correct. RN to up date standing weight this morning.  3. HTN: blood pressures are elevated. Will give IV lasix as above. If renal function is stable, would consider adding back spiro as this seemed to help with her volume status as an outpatient.   4. HLD: 04/13/2019-Cholesterol 155; HDL 56; LDL Cholesterol 77; VLDL 22 01/17/2020: Triglycerides 156  -- Statinsintolerance. Can't afford PCSK9 inhibitor.  Rcently started on Pitavastatin. Reports it was expensive for her. She is agreeable to crestor 2.5mg  every other day  5. CKD III: Cr 1.29 yesterday. Will recheck BMET this morning  6. Anemia: does have a hx of the same. 10.2>>8.5 this morning. Question whether this is contributing to her dyspnea.  -- check iron panel -- daily CBC  For questions or updates, please contact [place] Please consult [email or web address] for contact info under       Signed, Reino Bellis, NP  02/14/2020, 8:07 AM      Patient seen and examined. Agree with assessment and plan.  Cath data reviewed with patient.  No chest pain.  However patient complains of not feeling well.  Blood pressure was elevated earlier this morning, now improved.  She had received Lasix 40 mg IV.  There is mild residual ankle swelling bilaterally.  She has previously been shown to have low iron saturation at 4%.  We will give a dose of IV iron.  Recommend check stool guaiac.  Patient has seen gastroenterologist in [place].  Suspect shortness of breath is contributed by diastolic dysfunction as well as anemia.   Troy Sine, MD,  [facility] 02/14/2020 2:02 PM

## 2020-02-15 ENCOUNTER — Encounter (HOSPITAL_COMMUNITY): Payer: Self-pay | Admitting: Internal Medicine

## 2020-02-15 DIAGNOSIS — N189 Chronic kidney disease, unspecified: Secondary | ICD-10-CM

## 2020-02-15 DIAGNOSIS — D500 Iron deficiency anemia secondary to blood loss (chronic): Secondary | ICD-10-CM

## 2020-02-15 DIAGNOSIS — I5043 Acute on chronic combined systolic (congestive) and diastolic (congestive) heart failure: Secondary | ICD-10-CM | POA: Diagnosis not present

## 2020-02-15 DIAGNOSIS — I2510 Atherosclerotic heart disease of native coronary artery without angina pectoris: Secondary | ICD-10-CM

## 2020-02-15 DIAGNOSIS — I480 Paroxysmal atrial fibrillation: Secondary | ICD-10-CM

## 2020-02-15 DIAGNOSIS — R195 Other fecal abnormalities: Secondary | ICD-10-CM

## 2020-02-15 DIAGNOSIS — I214 Non-ST elevation (NSTEMI) myocardial infarction: Secondary | ICD-10-CM | POA: Diagnosis not present

## 2020-02-15 DIAGNOSIS — Z7902 Long term (current) use of antithrombotics/antiplatelets: Secondary | ICD-10-CM

## 2020-02-15 DIAGNOSIS — D631 Anemia in chronic kidney disease: Secondary | ICD-10-CM

## 2020-02-15 LAB — BASIC METABOLIC PANEL
Anion gap: 11 (ref 5–15)
BUN: 35 mg/dL — ABNORMAL HIGH (ref 8–23)
CO2: 22 mmol/L (ref 22–32)
Calcium: 8.9 mg/dL (ref 8.9–10.3)
Chloride: 97 mmol/L — ABNORMAL LOW (ref 98–111)
Creatinine, Ser: 1.95 mg/dL — ABNORMAL HIGH (ref 0.44–1.00)
GFR, Estimated: 25 mL/min — ABNORMAL LOW (ref >60)
Glucose, Bld: 95 mg/dL (ref 70–99)
Potassium: 4.1 mmol/L (ref 3.5–5.1)
Sodium: 130 mmol/L — ABNORMAL LOW (ref 135–145)

## 2020-02-15 LAB — CBC
HCT: 23.2 % — ABNORMAL LOW (ref 36.0–46.0)
HCT: 26.2 % — ABNORMAL LOW (ref 36.0–46.0)
Hemoglobin: 7.9 g/dL — ABNORMAL LOW (ref 12.0–15.0)
Hemoglobin: 8.9 g/dL — ABNORMAL LOW (ref 12.0–15.0)
MCH: 28.2 pg (ref 26.0–34.0)
MCH: 28.4 pg (ref 26.0–34.0)
MCHC: 34.1 g/dL (ref 30.0–36.0)
MCHC: 34 g/dL (ref 30.0–36.0)
MCV: 82.9 fL (ref 80.0–100.0)
MCV: 83.7 fL (ref 80.0–100.0)
Platelets: 274 10*3/uL (ref 150–400)
Platelets: 290 10*3/uL (ref 150–400)
RBC: 2.8 MIL/uL — ABNORMAL LOW (ref 3.87–5.11)
RBC: 3.13 MIL/uL — ABNORMAL LOW (ref 3.87–5.11)
RDW: 15 % (ref 11.5–15.5)
RDW: 15.1 % (ref 11.5–15.5)
WBC: 8.4 10*3/uL (ref 4.0–10.5)
WBC: 7.8 10*3/uL (ref 4.0–10.5)
nRBC: 0 % (ref 0.0–0.2)
nRBC: 0 % (ref 0.0–0.2)

## 2020-02-15 LAB — HEPARIN LEVEL (UNFRACTIONATED): Heparin Unfractionated: 0.17 IU/mL — ABNORMAL LOW (ref 0.30–0.70)

## 2020-02-15 MED ORDER — AMIODARONE HCL IN DEXTROSE 360-4.14 MG/200ML-% IV SOLN
60.0000 mg/h | INTRAVENOUS | Status: AC
  Administered 2020-02-15 (×2): 60 mg/h via INTRAVENOUS
  Filled 2020-02-15 (×2): qty 200

## 2020-02-15 MED ORDER — METOPROLOL TARTRATE 5 MG/5ML IV SOLN
5.0000 mg | Freq: Once | INTRAVENOUS | Status: AC
  Administered 2020-02-15: 5 mg via INTRAVENOUS
  Filled 2020-02-15: qty 5

## 2020-02-15 MED ORDER — HEPARIN (PORCINE) 25000 UT/250ML-% IV SOLN
1200.0000 [IU]/h | INTRAVENOUS | Status: DC
  Administered 2020-02-15: 800 [IU]/h via INTRAVENOUS
  Administered 2020-02-16: 1200 [IU]/h via INTRAVENOUS
  Filled 2020-02-15 (×2): qty 250

## 2020-02-15 MED ORDER — METOPROLOL TARTRATE 12.5 MG HALF TABLET
12.5000 mg | ORAL_TABLET | Freq: Four times a day (QID) | ORAL | Status: DC
  Administered 2020-02-15: 12.5 mg via ORAL
  Filled 2020-02-15: qty 1

## 2020-02-15 MED ORDER — ALUM & MAG HYDROXIDE-SIMETH 200-200-20 MG/5ML PO SUSP
30.0000 mL | Freq: Once | ORAL | Status: AC
  Administered 2020-02-15: 30 mL via ORAL
  Filled 2020-02-15: qty 30

## 2020-02-15 MED ORDER — SODIUM CHLORIDE 0.9 % IV SOLN
510.0000 mg | Freq: Once | INTRAVENOUS | Status: AC
  Administered 2020-02-18: 510 mg via INTRAVENOUS
  Filled 2020-02-15: qty 17

## 2020-02-15 MED ORDER — AMIODARONE HCL IN DEXTROSE 360-4.14 MG/200ML-% IV SOLN
30.0000 mg/h | INTRAVENOUS | Status: AC
  Administered 2020-02-16: 30 mg/h via INTRAVENOUS
  Filled 2020-02-15 (×2): qty 200

## 2020-02-15 MED ORDER — AMIODARONE LOAD VIA INFUSION
150.0000 mg | Freq: Once | INTRAVENOUS | Status: AC
  Administered 2020-02-15: 150 mg via INTRAVENOUS
  Filled 2020-02-15: qty 83.34

## 2020-02-15 MED ORDER — METOPROLOL TARTRATE 12.5 MG HALF TABLET
12.5000 mg | ORAL_TABLET | Freq: Two times a day (BID) | ORAL | Status: DC
  Filled 2020-02-15: qty 1

## 2020-02-15 NOTE — Progress Notes (Addendum)
Progress Note  Patient Name: Crystal [name] Date of Encounter: 02/15/2020  [facility] Cardiologist: Rozann Lesches, MD   Subjective   Feels weak, short of breath this morning. Does feel palpitations.   Inpatient Medications    Scheduled Meds: . aspirin EC  81 mg Oral q morning - 10a  . calcium-vitamin D  1 tablet Oral QAC lunch  . cholecalciferol  1,000 Units Oral Q breakfast  . clopidogrel  75 mg Oral q morning - 10a  . ferrous sulfate  325 mg Oral QAC lunch  . hydrALAZINE  25 mg Oral Q8H  . isosorbide mononitrate  30 mg Oral QHS  . metoprolol tartrate  12.5 mg Oral Q6H  . pantoprazole  40 mg Oral Daily  . rosuvastatin  2.5 mg Oral Q48H  . sodium chloride flush  3 mL Intravenous Q12H  . sodium chloride flush  3 mL Intravenous Q12H   Continuous Infusions: . sodium chloride    . heparin 800 Units/hr (02/15/20 4854)   PRN Meds: sodium chloride, acetaminophen, bisacodyl, nitroGLYCERIN, ondansetron (ZOFRAN) IV   Vital Signs    Vitals:   02/14/20 1501 02/14/20 2100 02/15/20 0454 02/15/20 0500  BP: (!) 117/54 137/66 135/73   Pulse: 84 73 (!) 122   Resp:  18    Temp:  98.7 F (37.1 C)  98.3 F (36.8 C)  TempSrc:  Oral    SpO2: 95% 100% 94%   Weight:      Height:        Intake/Output Summary (Last 24 hours) at 02/15/2020 0821 Last data filed at 02/14/2020 1535 Gross per 24 hour  Intake 0 ml  Output 450 ml  Net -450 ml   Last 3 Weights 02/14/2020 02/13/2020 01/26/2020  Weight (lbs) 172 lb 1.6 oz 141 lb 138 lb 3.2 oz  Weight (kg) 78.064 kg 63.957 kg 62.687 kg      Telemetry    Into afib RVR this morning around 4:30am - Personally Reviewed  ECG    Afib RVR rate 125bpm with nonspecific TW changes - Personally Reviewed  Physical Exam   GEN: No acute distress.   Neck: No JVD Cardiac: Irreg Irreg, tachy, no murmurs, rubs, or gallops.  Respiratory: Clear to auscultation bilaterally. GI: Soft, nontender, non-distended  MS: No edema; No  deformity. Neuro:  Nonfocal  Psych: Normal affect   Labs    High Sensitivity Troponin:   Recent Labs  Lab 01/16/20 1003 02/12/20 1726 02/12/20 2300 02/13/20 1126 02/13/20 1330  TROPONINIHS 614* 254* 1,735* 1,229* 1,055*      Chemistry Recent Labs  Lab 02/13/20 1126 02/13/20 1428 02/13/20 1437 02/14/20 0915 02/15/20 0344  NA 134*   < > 132* 130* 130*  K 3.3*   < > 3.3* 4.3 4.1  CL 96*  --   --  97* 97*  CO2 24  --   --  22 22  GLUCOSE 120*  --   --  173* 95  BUN 24*  --   --  30* 35*  CREATININE 1.29*  --   --  1.59* 1.95*  CALCIUM 9.1  --   --  8.6* 8.9  GFRNONAA 41*  --   --  32* 25*  ANIONGAP 14  --   --  11 11   < > = values in this interval not displayed.     Hematology Recent Labs  Lab 02/12/20 1726 02/13/20 1428 02/13/20 1437 02/14/20 0339 02/14/20 0915 02/15/20 0344  WBC 12.1*  --   --  10.3  --  8.4  RBC 3.67*  --   --  2.97* 2.98* 2.80*  HGB 10.2*   < > 8.2* 8.5*  --  7.9*  HCT 31.6*   < > 24.0* 24.6*  --  23.2*  MCV 86.1  --   --  82.8  --  82.9  MCH 27.8  --   --  28.6  --  28.2  MCHC 32.3  --   --  34.6  --  34.1  RDW 14.6  --   --  14.8  --  15.0  PLT 400  --   --  299  --  274   < > = values in this interval not displayed.   BNP Recent Labs  Lab 02/13/20 0355  BNP 4,423.6*     DDimer No results for input(s): DDIMER in the last 168 hours.   Radiology    CARDIAC CATHETERIZATION  Result Date: 02/13/2020  Non-stenotic Mid LAD lesion was previously treated.  Prox Cx to Mid Cx lesion is 20% stenosed.  Dist RCA lesion is 20% stenosed.  Non-stenotic Mid RCA lesion was previously treated.  Hemodynamic findings consistent with mild pulmonary hypertension.  LV end diastolic pressure is mildly elevated.  1. Nonobstructive CAD. Continued excellent patency of stents in the LAD and RCA 2. Mildly elevated LV filling pressures 3. Mildly elevated right heart pressures. 4. Normal cardiac output Plan: continue medical therapy. Diuresis. BP  control.    Cardiac Studies   Cath: 02/13/20   Non-stenotic Mid LAD lesion was previously treated.  Prox Cx to Mid Cx lesion is 20% stenosed.  Dist RCA lesion is 20% stenosed.  Non-stenotic Mid RCA lesion was previously treated.  Hemodynamic findings consistent with mild pulmonary hypertension.  LV end diastolic pressure is mildly elevated.  1. Nonobstructive CAD. Continued excellent patency of stents in the LAD and RCA 2. Mildly elevated LV filling pressures 3. Mildly elevated right heart pressures. 4. Normal cardiac output  Plan: continue medical therapy. Diuresis. BP control.  Diagnostic Dominance: Right     Patient Profile     86 y.o. female with hx of CAD s/p DES to mLAD 01/2019 and recent inferior STEMI s/p DES to [facility] 12/2019, ICM with improved LVEF (50-55% EF by echo 12/2019), HLD (statin intolerance), HLD and CKD III presented with few weeks hx of DOE, worsen in last day who found to have elevated enzyme and BNP.  Assessment & Plan    1. NSTEMI: presented with dyspnea on exertion, worse the day prior to admission. hsTn peaked at 1735. Underwent cardiac cath noted above with patent stents in the RCA and LAD. Recommendation to continue medical therapy on ASA and plavix.  -- on BB, Imdur  2. Chronic systolic CHF: echo with stable EF at 50-55% and G1DD.BNP >4000. Elevated filling pressures on cath. Given IV lasix yesterday with littler UOP. Rise in Cr, will hold on additional diuresis. Does not appear volume overloaded on exam.   3. HTN: Blood pressures are now soft this morning. Her coreg was switched to metoprolol this morning when she converted to afib. Will dose metoprolol 12.5mg  BID with soft blood pressures.   4. HLD: 04/13/2019-Cholesterol 155; HDL 56; LDL Cholesterol 77; VLDL 22 01/17/2020: Triglycerides 156 -- Statinsintolerance. Can't afford PCSK9 inhibitor. Recently started on Pitavastatin. Reports it was expensive for her. She is agreeable to  crestor 2.5mg  every other day  5. CKD III: Cr 1.59 yesterday, now up to 1.95 today. Holding additional diuresis. --  follow BMET  6. ID Anemia/ + Occult stools: 10.2>>8.5>>7.9 this morning. Iron was low on panel yesterday. S/p IV Iron x1.  Occult stool + yesterday. Suspect this is contributing to her dyspnea.  -- does follow with GI in [place] had an EGD back 9/21 with erosive gastropathy. Says she had a colonoscopy 4-5 years ago which was normal.  -- will consult GI for recommendations -- will recheck CBC later this afternoon given the need for IV heparin   7. New onset Afib RVR: converted into Afib this morning at 4:30. She is somewhat symptomatic with this. Suspect she will not tolerate higher rates as her BP this morning is soft.  -- will give IV amiodarone -- on IV heparin  For questions or updates, please contact [place] Please consult [email or web address] for contact info under        Signed, Reino Bellis, NP  02/15/2020, 8:21 AM     Patient seen and examined. Agree with assessment and plan.  Patient denies any recurrent chest pain but admits to significant fatigue.  This morning she had developed new onset atrial fibrillation with RVR at approximately 4:30 AM.  She was started on IV amiodarone and heparin.  She hassince converted back to sinus rhythm.  Laboratory now shows normal sinus rhythm with rate in the 60s.  Hemoglobin today is further decreased 7.9 and hemoglobin reduced to 23.2 and stool is positive for occult blood.  In light of globin drop with guaiac positive stools, since she has converted back to sinus rhythm will hold heparin continue amiodarone to make certain she does not develop recurrent AF.  Patient is status post most recent stenting of her RCA in December 2021 and in January 2021 had undergone prior stenting to her LAD.  Blood pressure has improved and on presentation during this admission was significantly hypertensive.  GI consult has been obtained today  and prior GI evaluations in the past had shown erosive gastropathy.  Plan CBC follow-up this afternoon if Hb is further decreased, may need 1 unit packed red blood cell transfusion.   Troy Sine, MD, [facility] 02/15/2020 12:43 PM

## 2020-02-15 NOTE — Consult Note (Addendum)
[facility] Consult: 9:32 AM 02/15/2020  LOS: 2 days    Referring Provider: Dr Debara Pickett  Primary Care Physician:  Glenda Chroman, MD Primary Gastroenterologist:  Dr. Gala Romney in [place].       Reason for Consultation:  FOBT + anemia   HPI: Crystal [name] is a 86 y.o. female.  PMH mastectomy for breast cancer.  CAD.  DES to RCA 01/16/2020 and STEMI with DES to LAD 01/30/2019. CHF, EF 30 to 35% x 01/2019 echocardiogram, subsequently improved to 45 to 50%.  Hypertension.  GERD.  CKD 3.  DM 2, on Alogliptin.   Home meds include DAPT, Protonix 40 mg/daily.    2016 Colonoscopy by Dr Anthony Sar in [place].  Results unknown.   05/2017 colonoscopy by Dr Anthony Sar, for FOBT + stool.  Normal per records, no biopsies obtained.     09/2019 EGD for dysphagia. Revealed nonobstructing Schatzki's ring at GE junction, Maloney dilated.  Multiple 3 mm erosions with stigmata of recent bleeding in gastric antrum (erosive gastropathy).  Normal duodenal bulb and second duodenum.   Biopsies revealed nonspecific erosive gastropathy and no H. Pylori. 01/31/2020 double-balloon esophagram: Moderate esophageal dysmotility otherwise unremarkable study without stricture, mass, obstruction.  Reviewed recent GI office notes which reveal complaint of diarrhea.  Lomotil did not help  but probiotic, Metamucil and as needed Imodium helped.  Diarrhea comes in spells once or twice a month and fiber supplements and meds control symptoms. Dysphagia improved since the EGD in September and starting on Protonix daily.     Presented to ED with 24 hours pain in left chest into shoulder and left arm, improved with nitroglycerin.  Intermittent shortness of breath and fatigue for about 1 week. Underwent repeat cardiac cath 1/17 showing nonobstructive CAD and patent  stents at LAD and RCA.  Weakness, dyspnea persisted in setting of A. fib with RVR into the 120s.  These have improved somewhat with amiodarone, rate control but she has not been out of bed to truly see how weakness has been affected.  IV heparin now in place. Hgb 10.2 >> 7.9.  Was 10 -11 during admission 1 month ago.  CV 82.  FOBT positive. Low iron, iron sat.  Ferritin 79.  Normal TIBC, Folate and B12.   GFR consistent with stage 4 CKD.  TSH 2.2.    She has appointment with renal specialist for initial evaluation on 1/28. No prior blood transfusions.  Says she started the oral iron about 6 to 8 months ago  Family Hx colon cancer in mat uncle.    Patient does not drink alcohol or use NSAIDs. She is divorced, lives alone in [place], no children.  Nieces and nephews keep a close eye on her and are in frequent contact. .    Past Medical History:  Diagnosis Date  . Bell's palsy   . Breast cancer ([place]) 1998   Right mastectomy  . CAD (coronary artery disease)    a. s/p STEMI in 01/2019 with DES to mid-LAD  . CHF (congestive heart failure) ([place])    a.  EF 30-35% by echo in 01/2019  . Essential hypertension   . GERD (gastroesophageal reflux disease)   . Gout   . History of skin cancer    Squamous cell, left shoulder  . Mixed hyperlipidemia   . Osteopenia   . Reflux esophagitis   . Thyroid nodule   . Type 2 diabetes mellitus ([place])     Past Surgical History:  Procedure Laterality Date  . BIOPSY  10/13/2019   Procedure: BIOPSY;  Surgeon: Daneil Dolin, MD;  [facility];  Service: Endoscopy;;  . CATARACT EXTRACTION  2016  . CORONARY ANGIOGRAPHY N/A 01/16/2020   Procedure: CORONARY ANGIOGRAPHY;  Surgeon: Jettie Booze, MD;  Location: [place] [facility];  Service: Cardiovascular;  Laterality: N/A;  . CORONARY STENT INTERVENTION N/A 01/16/2020   Procedure: CORONARY STENT INTERVENTION;  Surgeon: Jettie Booze, MD;  Location: [place] [facility];  Service:  Cardiovascular;  Laterality: N/A;  . CORONARY/GRAFT ACUTE MI REVASCULARIZATION N/A 01/30/2019   Procedure: Coronary/Graft Acute MI Revascularization;  Surgeon: Burnell Blanks, MD;  Location: [place] [facility];  Service: Cardiovascular;  Laterality: N/A;  . ESOPHAGOGASTRODUODENOSCOPY (EGD) WITH PROPOFOL N/A 10/13/2019   Non-obstructing Schatzki ring at GE junction, s/p dilation, erosive gastropathy with stigmata of recent bleeding, normal duodenum. Negative H.pylori.   [facility] HYSTEROSCOPY    . INTRAVASCULAR ULTRASOUND/IVUS N/A 01/16/2020   Procedure: Intravascular Ultrasound/IVUS;  Surgeon: Jettie Booze, MD;  Location: [place] [facility];  Service: Cardiovascular;  Laterality: N/A;  . LEFT HEART CATH AND CORONARY ANGIOGRAPHY N/A 01/30/2019   Procedure: LEFT HEART CATH AND CORONARY ANGIOGRAPHY;  Surgeon: Burnell Blanks, MD;  Location: [place] [facility];  Service: Cardiovascular;  Laterality: N/A;  . LEFT HEART CATH AND CORONARY ANGIOGRAPHY N/A 01/16/2020   Procedure: LEFT HEART CATH AND CORONARY ANGIOGRAPHY;  Surgeon: Jettie Booze, MD;  Location: [facility] [facility];  Service: Cardiovascular;  Laterality: N/A;  . Venia Minks DILATION N/A 10/13/2019   Procedure: Venia Minks DILATION;  Surgeon: Daneil Dolin, MD;  [facility];  Service: Endoscopy;  Laterality: N/A;  . Right mastectomy  1998   [place]  . RIGHT/LEFT HEART CATH AND CORONARY ANGIOGRAPHY N/A 02/13/2020   Procedure: RIGHT/LEFT HEART CATH AND CORONARY ANGIOGRAPHY;  Surgeon: Martinique, Peter M, MD;  Location: [place] [place] [facility];  Service: Cardiovascular;  Laterality: N/A;    Prior to Admission medications   Medication Sig Start Date End Date Taking? Authorizing Provider  acetaminophen (TYLENOL) 500 MG tablet Take 500 mg by mouth every 6 (six) hours as needed for headache (pain).   Yes [provider]  Alogliptin Benzoate 12.5 MG TABS Take 12.5 mg by mouth daily at 2 PM.  06/29/19  Yes [provider]  aspirin EC 81 MG tablet Take 81 mg by mouth every morning. (0930)   Yes [provider]  Calcium Carbonate-Vitamin D (CALCIUM 500 + D PO) Take 1 tablet by mouth daily before lunch. Citracal plus D3   Yes [provider]  carvedilol (COREG) 25 MG tablet Take 1 tablet (25 mg total) by mouth in the morning and at bedtime. Breakfast & supper 11/28/19  Yes Satira Sark, MD  cholecalciferol (VITAMIN D3) 25 MCG (1000 UT) tablet Take 1,000 Units by mouth daily with breakfast.   Yes [provider]  CINNAMON PO Take 1,000 mg by mouth daily before lunch.    Yes [provider]  clopidogrel (PLAVIX) 75 MG tablet TAKE 1 TABLET  BY MOUTH DAILY--STOP BRILLINTA. Patient taking differently: Take 75 mg by mouth every morning. 10/05/19  Yes Satira Sark, MD  diazepam (VALIUM) 2 MG tablet Take 2 mg by mouth 2 (two) times daily as needed (dizzy spells.).   Yes [provider]  Doxepin HCl 3 MG TABS Take 3 mg by mouth at bedtime as needed (sleep.).  09/26/19  Yes [provider]  ferrous sulfate 325 (65 FE) MG tablet Take 325 mg by mouth daily before lunch.   Yes [provider]  furosemide (LASIX) 20 MG tablet Take 1 tablet (20 mg total) by mouth daily. 03/10/19 10/05/20 Yes Strader, Fransisco Hertz, PA-C  hydrALAZINE (APRESOLINE) 50 MG tablet Take 0.5 tablets (25 mg total) by mouth in the morning and at bedtime. 02/10/20 05/10/20 Yes Satira Sark, MD  isosorbide mononitrate (IMDUR) 30 MG 24 hr tablet Take 1 tablet (30 mg total) by mouth daily. Patient taking differently: Take 30 mg by mouth at bedtime. 01/19/20  Yes Nahser, Wonda Cheng, MD  loperamide (IMODIUM) 2 MG capsule Take 2-4 mg by mouth 4 (four) times daily as needed for diarrhea or loose stools.   Yes [provider]  meclizine (ANTIVERT) 25 MG tablet Take 25 mg by mouth 2 (two) times daily as needed for dizziness.   Yes [provider]  Multiple Vitamin  (MULTIVITAMIN WITH MINERALS) TABS tablet Take 1 tablet by mouth daily. Centrum Silver for Women   Yes [provider]  multivitamin-lutein (OCUVITE-LUTEIN) CAPS capsule Take 1 capsule by mouth daily before lunch.    Yes [provider]  nitroGLYCERIN (NITROSTAT) 0.4 MG SL tablet Place 1 tablet (0.4 mg total) under the tongue every 5 (five) minutes x 3 doses as needed for chest pain. 02/02/19  Yes Cheryln Manly, NP  Omega-3 Fatty Acids (FISH OIL PO) Take 1,576 mg by mouth daily before lunch.   Yes [provider]  pantoprazole (PROTONIX) 40 MG tablet Take 1 tablet (40 mg total) by mouth daily. Take 30 minutes before breakfast 01/12/20  Yes Annitta Needs, NP  Pitavastatin Calcium (LIVALO) 1 MG TABS Take 1 tablet (1 mg total) by mouth daily. 02/02/20  Yes Satira Sark, MD  saccharomyces boulardii (FLORASTOR) 250 MG capsule Take 250 mg by mouth 2 (two) times daily as needed (diarrhea).    Yes [provider]  sodium chloride (OCEAN) 0.65 % SOLN nasal spray Place 1 spray into both nostrils 2 (two) times daily as needed for congestion.    Yes [provider]    Scheduled Meds: . aspirin EC  81 mg Oral q morning - 10a  . calcium-vitamin D  1 tablet Oral QAC lunch  . cholecalciferol  1,000 Units Oral Q breakfast  . clopidogrel  75 mg Oral q morning - 10a  . ferrous sulfate  325 mg Oral QAC lunch  . hydrALAZINE  25 mg Oral Q8H  . isosorbide mononitrate  30 mg Oral QHS  . metoprolol tartrate  12.5 mg Oral BID  . pantoprazole  40 mg Oral Daily  . rosuvastatin  2.5 mg Oral Q48H  . sodium chloride flush  3 mL Intravenous Q12H  . sodium chloride flush  3 mL Intravenous Q12H   Infusions: . sodium chloride    . amiodarone 60 mg/hr (02/15/20 0921)   Followed by  . amiodarone    . [START ON 02/18/2020] ferumoxytol    . heparin 800 Units/hr (02/15/20 1610)   PRN Meds: sodium chloride, acetaminophen, bisacodyl,  nitroGLYCERIN, ondansetron (ZOFRAN)  IV   Allergies as of 02/12/2020 - Review Complete 02/12/2020  Allergen Reaction Noted  . Bactrim [sulfamethoxazole-trimethoprim] Nausea And Vomiting 06/02/2017  . Sulfa antibiotics Nausea And Vomiting 06/02/2017  . Amlodipine Other (See Comments) 06/02/2017  . Clonidine derivatives Other (See Comments) 06/02/2017  . Evista [raloxifene hcl] Other (See Comments) 06/02/2017  . Fosamax [alendronate sodium] Other (See Comments) 06/02/2017  . Glipizide Other (See Comments) 06/02/2017  . Lipitor [atorvastatin calcium] Other (See Comments) 06/02/2017  . Losartan Other (See Comments) 06/02/2017  . Pravastatin Other (See Comments) 06/02/2017  . Azithromycin Rash and Other (See Comments) 06/02/2017    Family History  Problem Relation Age of Onset  . Stroke Mother   . Heart attack Father   . Aneurysm Sister   . Heart attack Maternal Uncle   . Heart disease Maternal Uncle   . Heart attack Paternal Aunt   . Heart disease Paternal Aunt   . Heart attack Paternal Grandfather   . Heart disease Paternal Grandfather   . Heart attack Paternal Aunt   . Heart disease Paternal Aunt   . Heart attack Maternal Uncle   . Heart disease Maternal Uncle   . Heart attack Maternal Uncle   . Heart disease Maternal Uncle   . Heart attack Sister   . Heart disease Sister   . Cancer Sister   . Colon cancer Neg Hx   . Colon polyps Neg Hx     Social History   Socioeconomic History  . Marital status: Divorced    Spouse name: Not on file  . Number of children: Not on file  . Years of education: Not on file  . Highest education level: Not on file  Occupational History  . Not on file  Tobacco Use  . Smoking status: Never Smoker  . Smokeless tobacco: Never Used  Vaping Use  . Vaping Use: Never used  Substance and Sexual Activity  . Alcohol use: Never  . Drug use: Never  . Sexual activity: Not on file  Other Topics Concern  . Not on file  Social History Narrative  . Not on file   Social  Determinants of Health   Financial Resource Strain: Not on file  Food Insecurity: Not on file  Transportation Needs: Not on file  Physical Activity: Not on file  Stress: Not on file  Social Connections: Not on file  Intimate Partner Violence: Not on file    REVIEW OF SYSTEMS: Constitutional: Weakness, fatigue ENT:  No nose bleeds Pulm: Shortness of breath.  No cough. CV: Left-sided chest pain improved.  Able to sense palpitations and this morning she has no sense of tachypalpitations.  No LE edema.  GU:  No hematuria, no frequency GI: No dysphagia.  See HPI. Heme: Denies unusual or excessive bleeding or bruising other than has occasional purpura on her arms. Transfusions: No previous blood transfusions Neuro:  No headaches, no peripheral tingling or numbness.  No syncope, no seizures. Derm:  No itching, no rash or sores.  Endocrine:  No sweats or chills.  No polyuria or dysuria Immunization: Has been vaccinated with Moderna COVID-vaccine x2 in February, March 2021.    PHYSICAL EXAM: Vital signs in last 24 hours: Vitals:   02/15/20 0454 02/15/20 0500  BP: 135/73   Pulse: (!) 122   Resp:    Temp:  98.3 F (36.8 C)  SpO2: 94%    Wt Readings from Last 3 Encounters:  02/14/20 78.1 kg  01/26/20 62.7 kg  01/18/20 63.5 kg    General: Pleasant, pale, comfortable, alert, well-appearing Head: No facial asymmetry or swelling.  No signs of head trauma. Eyes: Conjunctiva pale.  EOMI. Ears: No hearing deficit Nose: No congestion or discharge Mouth: Mucosa moist, pink, clear.  Tongue midline. Neck: No JVD, masses, thyromegaly Lungs: Few basilar crackles more prominent on the left.  No cough or labored breathing. Heart: RRR.  Telemetry with sinus rhythm, rate 65. Abdomen: Soft without tenderness.  No distention.  Active bowel sounds.  No HSM, masses, bruits, hernias.   Rectal: Deferred Musc/Skeltl: No joint redness, swelling or gross deformities. Extremities: Warm feet.  No  peripheral edema. Neurologic: Alert.  Oriented x3.  Excellent historian.  Moves all 4 limbs without tremors or gross weakness. Skin: No telangiectasia, no rash, no sores.  Scant purpura on the forearms. Nodes: No cervical adenopathy Psych: Pleasant, cooperative, calm.  Intake/Output from previous day: 01/18 0701 - 01/19 0700 In: 0  Out: 450 [Urine:450] Intake/Output this shift: No intake/output data recorded.  LAB RESULTS: Recent Labs    02/12/20 1726 02/13/20 1428 02/13/20 1437 02/14/20 0339 02/15/20 0344  WBC 12.1*  --   --  10.3 8.4  HGB 10.2*   < > 8.2* 8.5* 7.9*  HCT 31.6*   < > 24.0* 24.6* 23.2*  PLT 400  --   --  299 274   < > = values in this interval not displayed.   BMET Lab Results  Component Value Date   NA 130 (L) 02/15/2020   NA 130 (L) 02/14/2020   NA 132 (L) 02/13/2020   K 4.1 02/15/2020   K 4.3 02/14/2020   K 3.3 (L) 02/13/2020   CL 97 (L) 02/15/2020   CL 97 (L) 02/14/2020   CL 96 (L) 02/13/2020   CO2 22 02/15/2020   CO2 22 02/14/2020   CO2 24 02/13/2020   GLUCOSE 95 02/15/2020   GLUCOSE 173 (H) 02/14/2020   GLUCOSE 120 (H) 02/13/2020   BUN 35 (H) 02/15/2020   BUN 30 (H) 02/14/2020   BUN 24 (H) 02/13/2020   CREATININE 1.95 (H) 02/15/2020   CREATININE 1.59 (H) 02/14/2020   CREATININE 1.29 (H) 02/13/2020   CALCIUM 8.9 02/15/2020   CALCIUM 8.6 (L) 02/14/2020   CALCIUM 9.1 02/13/2020   LFT No results for input(s): PROT, ALBUMIN, AST, ALT, ALKPHOS, BILITOT, BILIDIR, IBILI in the last 72 hours. PT/INR Lab Results  Component Value Date   INR 1.1 02/14/2020   INR 1.1 01/16/2020   INR 1.2 02/27/2019   Hepatitis Panel No results for input(s): HEPBSAG, HCVAB, HEPAIGM, HEPBIGM in the last 72 hours. C-Diff No components found for: CDIFF Lipase  No results found for: LIPASE  Drugs of Abuse  No results found for: LABOPIA, COCAINSCRNUR, LABBENZ, AMPHETMU, THCU, LABBARB   RADIOLOGY STUDIES: CARDIAC CATHETERIZATION  Result Date: 02/13/2020   Non-stenotic Mid LAD lesion was previously treated.  Prox Cx to Mid Cx lesion is 20% stenosed.  Dist RCA lesion is 20% stenosed.  Non-stenotic Mid RCA lesion was previously treated.  Hemodynamic findings consistent with mild pulmonary hypertension.  LV end diastolic pressure is mildly elevated.  1. Nonobstructive CAD. Continued excellent patency of stents in the LAD and RCA 2. Mildly elevated LV filling pressures 3. Mildly elevated right heart pressures. 4. Normal cardiac output Plan: continue medical therapy. Diuresis. BP control.     IMPRESSION:   *   [place] anemia.  FOBT + in pt on DOAC.  S/p Feraheme 1/18.  Continues on  home dose of po  Fe SO4 325 mg/daily.  Oral iron can cause false FOBT positive.    *   Left-sided chest and upper extremity pain cardiac stents 01/2019, 12/2019.  Nonobstructive CAD and patent stents by 02/13/2020 cardiac cath.  *   Chronic DAPT.  Not on hold.    *   A fib, RVR new onset.  Heparin infusion in place.   *   GERD.   Erosive gastritis per EGD of 09/2019.  Daily Protonix 40 mg/day continues.    *    Hx dysphagia.  S/p dilation of nonobstructing Schatzki's ring in 09/2019.  Moderate esophageal dysmotility per esophagram of 01/31/2019  *     Hyponatremia.  *   Stage 4 CKD, certainly contributes to anemia.      PLAN:     *   Difficult situation.  Would suggest holding Heparin, Plavix but this needs to be approved by cardiology as drug-eluting stents placed twice in last year, latest 4 weeks ago and she has a fib/RVR.    *   Leave Protonix 40 mg po q day in place.    *   Ok to eat. Ordered CM/HH diet.      Azucena Freed  02/15/2020, 9:32 AM Phone [phone]  I have reviewed the entire case in detail with the above APP and discussed the plan in detail.  Therefore, I agree with the diagnoses recorded above. In addition,  I have personally interviewed and examined the patient and have personally reviewed any abdominal/pelvic CT scan images.  My additional  thoughts are as follows:  I also performed a rectal exam in the presence of the patient's nurse and found her to have soft green stool in the rectal vault with no overt blood.  Multifactorial anemia, seems largely driven by chronic kidney disease and then acutely worse with MI, multiple medicines administered, volume shifts.  Heme positive stool of unclear duration.  Seems unlikely to be a significant contributing factor to this acute drop in hemoglobin since there is no overt GI bleeding.  Given acute MI and new A. fib and pulmonary edema over last few days, I am not currently planning any endoscopic procedures because I feel the risk would be greater than the expected benefits.  She needs DAPT from a recent stent.  She also needs to continue heparin with a new onset A. fib with RVR.  I think the heparin would only need to be discontinued if she started having overt bleeding with bright red blood per rectum or melena.  Otherwise she should have serial hemoglobin and hematocrit to be transfused at a threshold determined by cardiology in consideration of her acute coronary syndrome (probably 7.0-7.5 ).  She is on once daily oral PPI, which we will continue at current dose.  Last upper endoscopy report by Dr. Gala Romney from September 2021 reviewed.  There are minor antral erosive changes.  She could have had progression of that condition, but I consider that unlikely at this point.  We will follow with you and appreciate the opportunity to be involved in your patient's care.   Crystal [name] [phone]

## 2020-02-15 NOTE — Progress Notes (Signed)
Patient converted to Sinus rhythm around 1000 after receiving bolus amiodarone and started on drip.. EKG obtained and placed to Johnnette Gourd, NP paged and notified. Awaiting return call. Leveda Anna, BSN, RN

## 2020-02-15 NOTE — Progress Notes (Signed)
[place] for Heparin  Indication: atrial fibrillation  Allergies  Allergen Reactions  . Bactrim [Sulfamethoxazole-Trimethoprim] Nausea And Vomiting  . Sulfa Antibiotics Nausea And Vomiting  . Amlodipine Other (See Comments)    EDEMA  . Clonidine Derivatives Other (See Comments)    FATIGUE  . Evista [Raloxifene Hcl] Other (See Comments)    GERD  . Fosamax [Alendronate Sodium] Other (See Comments)    INCREASED GERD   . Glipizide Other (See Comments)    PALPITATIONS  . Lipitor [Atorvastatin Calcium] Other (See Comments)    ACHING  . Losartan Other (See Comments)    FATIGUE   . Pravastatin Other (See Comments)    ACHING  . Azithromycin Rash and Other (See Comments)    FACIAL BURNING     Patient Measurements: Height: 5\' 5"  (165.1 cm) Weight: 78.1 kg (172 lb 1.6 oz) IBW/kg (Calculated) : 57  Vital Signs: Temp: 98.7 F (37.1 C) (01/18 2100) Temp Source: Oral (01/18 2100) BP: 135/73 (01/19 0454) Pulse Rate: 122 (01/19 0454)  Labs: Recent Labs    02/12/20 1726 02/12/20 2300 02/13/20 1126 02/13/20 1330 02/13/20 1428 02/13/20 1437 02/14/20 0339 02/14/20 0915 02/15/20 0344  HGB 10.2*  --   --   --    < > 8.2* 8.5*  --  7.9*  HCT 31.6*  --   --   --    < > 24.0* 24.6*  --  23.2*  PLT 400  --   --   --   --   --  299  --  274  LABPROT  --   --   --   --   --   --  14.2  --   --   INR  --   --   --   --   --   --  1.1  --   --   HEPARINUNFRC  --   --   --  0.18*  --   --   --   --   --   CREATININE 1.66*  --  1.29*  --   --   --   --  1.59* 1.95*  TROPONINIHS 254* 1,735* 1,229* 1,055*  --   --   --   --   --    < > = values in this interval not displayed.    Estimated Creatinine Clearance: 22.6 mL/min (A) (by C-G formula based on SCr of 1.95 mg/dL (H)).   Medical History: Past Medical History:  Diagnosis Date  . Bell's palsy   . Breast cancer ([place]) 1998   Right mastectomy  . CAD (coronary artery disease)    a. s/p STEMI  in 01/2019 with DES to mid-LAD  . CHF (congestive heart failure) ([place])    a. EF 30-35% by echo in 01/2019  . Essential hypertension   . GERD (gastroesophageal reflux disease)   . Gout   . History of skin cancer    Squamous cell, left shoulder  . Mixed hyperlipidemia   . Osteopenia   . Reflux esophagitis   . Thyroid nodule   . Type 2 diabetes mellitus [facility])      Assessment: 86 y/o F s/p cath 1/17 with non-obstructive CAD. No intervention, her previous stents remained patent. Pt now found to be in afib>>starting heparin. Hgb in the low 8's for the last few days>>watch closely. Noted renal dysfunction.   Goal of Therapy:  Heparin level 0.3-0.7 units/ml Monitor platelets by anticoagulation  protocol: Yes   Plan:  No bolus with recent cath Start heparin drip at 800 units/hr 1400 heparin level Watch Hgb closely   Narda Bonds, PharmD, BCPS Clinical Pharmacist Phone: [phone]

## 2020-02-15 NOTE — Progress Notes (Signed)
[place] for Heparin  Indication: atrial fibrillation  Allergies  Allergen Reactions  . Bactrim [Sulfamethoxazole-Trimethoprim] Nausea And Vomiting  . Sulfa Antibiotics Nausea And Vomiting  . Amlodipine Other (See Comments)    EDEMA  . Clonidine Derivatives Other (See Comments)    FATIGUE  . Evista [Raloxifene Hcl] Other (See Comments)    GERD  . Fosamax [Alendronate Sodium] Other (See Comments)    INCREASED GERD   . Glipizide Other (See Comments)    PALPITATIONS  . Lipitor [Atorvastatin Calcium] Other (See Comments)    ACHING  . Losartan Other (See Comments)    FATIGUE   . Pravastatin Other (See Comments)    ACHING  . Azithromycin Rash and Other (See Comments)    FACIAL BURNING     Patient Measurements: Height: 5\' 5"  (165.1 cm) Weight: 78.1 kg (172 lb 1.6 oz) IBW/kg (Calculated) : 57  Heparin dosing weight - 64 kg  Vital Signs: Temp: 97.7 F (36.5 C) (01/19 1440) Temp Source: Oral (01/19 1440) BP: 127/61 (01/19 1440) Pulse Rate: 62 (01/19 1440)  Labs: Recent Labs    02/12/20 2300 02/13/20 1126 02/13/20 1330 02/13/20 1428 02/14/20 0339 02/14/20 0915 02/15/20 0344 02/15/20 1528  HGB  --   --   --    < > 8.5*  --  7.9* 8.9*  HCT  --   --   --    < > 24.6*  --  23.2* 26.2*  PLT  --   --   --   --  299  --  274 290  LABPROT  --   --   --   --  14.2  --   --   --   INR  --   --   --   --  1.1  --   --   --   HEPARINUNFRC  --   --  0.18*  --   --   --   --  0.17*  CREATININE  --  1.29*  --   --   --  1.59* 1.95*  --   TROPONINIHS 1,735* 1,229* 1,055*  --   --   --   --   --    < > = values in this interval not displayed.    Estimated Creatinine Clearance: 22.6 mL/min (A) (by C-G formula based on SCr of 1.95 mg/dL (H)).   Medical History: Past Medical History:  Diagnosis Date  . Bell's palsy   . Breast cancer ([place]) 1998   Right mastectomy  . CAD (coronary artery disease)    a. s/p STEMI in 01/2019 with DES to  mid-LAD  . CHF (congestive heart failure) ([place])    a. EF 30-35% by echo in 01/2019  . Essential hypertension   . GERD (gastroesophageal reflux disease)   . Gout   . History of skin cancer    Squamous cell, left shoulder  . Mixed hyperlipidemia   . Osteopenia   . Thyroid nodule   . Type 2 diabetes mellitus [facility])      Assessment: 86 y/o F s/p cath 1/17 with non-obstructive CAD. No intervention, her previous stents remained patent. Pt now found to be in afib>>starting heparin. Noted renal dysfunction.   Heparin level low at 0.17. CBC stable (Hg 8.9 on redraw today). No bleeding or issues with infusion per discussion with RN.  Spoke with Dr. Claiborne Billings - note previously stated to hold heparin over concern for Hg drop to 7.9  but will continue with repeat Hg 8.9 and GI ok'd to continue.  Goal of Therapy:  Heparin level 0.3-0.7 units/ml Monitor platelets by anticoagulation protocol: Yes   Plan:  No bolus. Increase heparin drip to 950 units/hr Check 8hr heparin level Monitor daily CBC, s/sx bleeding   Arturo Morton, PharmD, BCPS Please check AMION for all [place] contact numbers Clinical Pharmacist 02/15/2020 5:08 PM

## 2020-02-15 NOTE — Consult Note (Signed)
   [facility] CM Inpatient Consult   02/15/2020  [place] 11/29/1934 [id number]   [place] Organization [ACO] Patient:  Medicare  Patient is currently active with [facility] for chronic disease management services.  Patient has been engaged by a [employer].  Our community based plan of care has focused on disease management and community resource support.    Plan: Follow up with [facility] Geriatric-NP of ongoing follow up needs.  Will Inpatient Transition Of Care [TOC] team member to make aware that [place] [facility] following.   Of note, [facility] Care Management services does not replace or interfere with any services that are needed or arranged by inpatient [facility] care management team.  For additional questions or referrals please contact:  Natividad Brood, RN BSN [facility] Liaison  [phone] business mobile phone Toll free office [phone]  Fax number: [phone] [email or web address]

## 2020-02-15 NOTE — Progress Notes (Signed)
Received call from telemetry notifying pt exhibits Afib. Pt is asymptomatic, states she has never been diagnosed with Afib. Ran EKG, notified cardiologist, awaiting orders.

## 2020-02-16 DIAGNOSIS — I2510 Atherosclerotic heart disease of native coronary artery without angina pectoris: Secondary | ICD-10-CM | POA: Diagnosis not present

## 2020-02-16 DIAGNOSIS — I214 Non-ST elevation (NSTEMI) myocardial infarction: Secondary | ICD-10-CM | POA: Diagnosis not present

## 2020-02-16 DIAGNOSIS — R195 Other fecal abnormalities: Secondary | ICD-10-CM | POA: Diagnosis not present

## 2020-02-16 DIAGNOSIS — D500 Iron deficiency anemia secondary to blood loss (chronic): Secondary | ICD-10-CM | POA: Diagnosis not present

## 2020-02-16 DIAGNOSIS — N189 Chronic kidney disease, unspecified: Secondary | ICD-10-CM | POA: Diagnosis not present

## 2020-02-16 DIAGNOSIS — I509 Heart failure, unspecified: Secondary | ICD-10-CM

## 2020-02-16 DIAGNOSIS — I480 Paroxysmal atrial fibrillation: Secondary | ICD-10-CM | POA: Diagnosis not present

## 2020-02-16 LAB — CBC
HCT: 25.7 % — ABNORMAL LOW (ref 36.0–46.0)
HCT: 25.9 % — ABNORMAL LOW (ref 36.0–46.0)
Hemoglobin: 8.8 g/dL — ABNORMAL LOW (ref 12.0–15.0)
Hemoglobin: 8.4 g/dL — ABNORMAL LOW (ref 12.0–15.0)
MCH: 28.3 pg (ref 26.0–34.0)
MCH: 26.9 pg (ref 26.0–34.0)
MCHC: 34.2 g/dL (ref 30.0–36.0)
MCHC: 32.4 g/dL (ref 30.0–36.0)
MCV: 82.6 fL (ref 80.0–100.0)
MCV: 83 fL (ref 80.0–100.0)
Platelets: 285 10*3/uL (ref 150–400)
Platelets: 257 10*3/uL (ref 150–400)
RBC: 3.11 MIL/uL — ABNORMAL LOW (ref 3.87–5.11)
RBC: 3.12 MIL/uL — ABNORMAL LOW (ref 3.87–5.11)
RDW: 15.3 % (ref 11.5–15.5)
RDW: 15.2 % (ref 11.5–15.5)
WBC: 9.4 10*3/uL (ref 4.0–10.5)
WBC: 9.4 10*3/uL (ref 4.0–10.5)
nRBC: 0 % (ref 0.0–0.2)
nRBC: 0 % (ref 0.0–0.2)

## 2020-02-16 LAB — BASIC METABOLIC PANEL
Anion gap: 14 (ref 5–15)
BUN: 46 mg/dL — ABNORMAL HIGH (ref 8–23)
CO2: 22 mmol/L (ref 22–32)
Calcium: 9 mg/dL (ref 8.9–10.3)
Chloride: 94 mmol/L — ABNORMAL LOW (ref 98–111)
Creatinine, Ser: 2.96 mg/dL — ABNORMAL HIGH (ref 0.44–1.00)
GFR, Estimated: 15 mL/min — ABNORMAL LOW (ref >60)
Glucose, Bld: 182 mg/dL — ABNORMAL HIGH (ref 70–99)
Potassium: 4.9 mmol/L (ref 3.5–5.1)
Sodium: 130 mmol/L — ABNORMAL LOW (ref 135–145)

## 2020-02-16 LAB — HEPARIN LEVEL (UNFRACTIONATED): Heparin Unfractionated: 0.1 IU/mL — ABNORMAL LOW (ref 0.30–0.70)

## 2020-02-16 LAB — GLUCOSE, CAPILLARY: Glucose-Capillary: 144 mg/dL — ABNORMAL HIGH (ref 70–99)

## 2020-02-16 MED ORDER — CALCIUM POLYCARBOPHIL 625 MG PO TABS
625.0000 mg | ORAL_TABLET | Freq: Every day | ORAL | Status: DC
  Administered 2020-02-16 – 2020-03-04 (×17): 625 mg via ORAL
  Filled 2020-02-16 (×18): qty 1

## 2020-02-16 MED ORDER — SODIUM CHLORIDE 0.9 % IV SOLN: INTRAVENOUS | Status: DC

## 2020-02-16 MED ORDER — SODIUM CHLORIDE 0.9% FLUSH: 3.0000 mL | INTRAVENOUS | Status: DC | PRN

## 2020-02-16 MED ORDER — ALUM & MAG HYDROXIDE-SIMETH 200-200-20 MG/5ML PO SUSP
30.0000 mL | ORAL | Status: DC | PRN
  Administered 2020-02-16: 30 mL via ORAL
  Filled 2020-02-16: qty 30

## 2020-02-16 MED ORDER — AMIODARONE HCL 200 MG PO TABS
400.0000 mg | ORAL_TABLET | Freq: Two times a day (BID) | ORAL | Status: DC
  Administered 2020-02-16 (×2): 400 mg via ORAL
  Filled 2020-02-16 (×2): qty 2

## 2020-02-16 MED ORDER — METOPROLOL TARTRATE 25 MG PO TABS
25.0000 mg | ORAL_TABLET | Freq: Two times a day (BID) | ORAL | Status: DC
  Administered 2020-02-16 – 2020-02-22 (×13): 25 mg via ORAL
  Filled 2020-02-16 (×14): qty 1

## 2020-02-16 MED ORDER — ASPIRIN 81 MG PO CHEW
CHEWABLE_TABLET | ORAL | Status: AC
  Filled 2020-02-16: qty 1

## 2020-02-16 MED ORDER — SODIUM CHLORIDE 0.9 % IV SOLN: 250.0000 mL | INTRAVENOUS | Status: DC | PRN

## 2020-02-16 MED ORDER — MAGNESIUM HYDROXIDE 400 MG/5ML PO SUSP: 30.0000 mL | Freq: Every day | ORAL | Status: DC | PRN

## 2020-02-16 MED ORDER — APIXABAN 2.5 MG PO TABS
2.5000 mg | ORAL_TABLET | Freq: Two times a day (BID) | ORAL | Status: DC
  Administered 2020-02-16 (×2): 2.5 mg via ORAL
  Filled 2020-02-16 (×2): qty 1

## 2020-02-16 MED ORDER — SACCHAROMYCES BOULARDII 250 MG PO CAPS
250.0000 mg | ORAL_CAPSULE | Freq: Two times a day (BID) | ORAL | Status: DC
  Administered 2020-02-16 – 2020-03-04 (×31): 250 mg via ORAL
  Filled 2020-02-16 (×37): qty 1

## 2020-02-16 NOTE — Evaluation (Signed)
Physical Therapy Evaluation Patient Details Name: Crystal [name] MRN: [id number] DOB: 1934/03/18 Today's Date: 02/16/2020   History of Present Illness  Pt adm with NSTEMI and acute on chronic heart failure. PMH - CAD, PCI 12/2019, STEMI 01/2019, HTN, ckd, breast CA  Clinical Impression  Pt admitted with above diagnosis and presents to PT with functional limitations due to deficits listed below (See PT problem list). Pt needs skilled PT to maximize independence and safety to allow discharge to home with support of niece and nephew. Pt and nephew present and agree with plan for home.      Follow Up Recommendations Home health PT;Supervision - Intermittent    Equipment Recommendations  Other (comment) (rollator)    Recommendations for Other Services       Precautions / Restrictions Precautions Precautions: Fall      Mobility  Bed Mobility Overal bed mobility: Modified Independent             General bed mobility comments: Incr time    Transfers Overall transfer level: Needs assistance Equipment used: None;4-wheeled walker Transfers: Sit to/from Stand Sit to Stand: Min guard         General transfer comment: Assist for safety  Ambulation/Gait Ambulation/Gait assistance: Min assist;Min guard Gait Distance (Feet): 150 Feet Assistive device: 4-wheeled walker;None Gait Pattern/deviations: Step-through pattern;Decreased step length - right;Decreased step length - left;Shuffle Gait velocity: decr Gait velocity interpretation: <1.31 ft/sec, indicative of household ambulator General Gait Details: Min assist for balance when not using rollator and pt with very tentative gait. With rollator min guard for safety. Distance limited by dyspnea.  Stairs            Wheelchair Mobility    Modified Rankin (Stroke Patients Only)       Balance Overall balance assessment: Needs assistance Sitting-balance support: No upper extremity supported;Feet supported Sitting  balance-Leahy Scale: Good     Standing balance support: No upper extremity supported;During functional activity Standing balance-Leahy Scale: Fair                               Pertinent Vitals/Pain Pain Assessment: No/denies pain    Home Living Family/patient expects to be discharged to:: Private residence Living Arrangements: Alone Available Help at Discharge: Family;Available PRN/intermittently Type of Home: House Home Access: Stairs to enter   Entrance Stairs-Number of Steps: 1 Home Layout: One level Home Equipment: None Additional Comments: Pt can stay with niece if needed. Pt's nephew also able to help in the home.    Prior Function Level of Independence: Independent               Hand Dominance        Extremity/Trunk Assessment   Upper Extremity Assessment Upper Extremity Assessment: Defer to OT evaluation    Lower Extremity Assessment Lower Extremity Assessment: Generalized weakness       Communication   Communication: No difficulties  Cognition Arousal/Alertness: Awake/alert Behavior During Therapy: Flat affect Overall Cognitive Status: Within Functional Limits for tasks assessed                                        General Comments General comments (skin integrity, edema, etc.): Amb on RA with dyspnea 3/4 and SpO2 98%.    Exercises     Assessment/Plan    PT Assessment Patient needs continued PT services  PT Problem List Decreased strength;Decreased activity tolerance;Decreased balance;Decreased mobility;Cardiopulmonary status limiting activity       PT Treatment Interventions DME instruction;Gait training;Functional mobility training;Therapeutic activities;Therapeutic exercise;Balance training;Patient/family education    PT Goals (Current goals can be found in the Care Plan section)  Acute Rehab PT Goals Patient Stated Goal: return home PT Goal Formulation: With patient Time For Goal Achievement:  03/01/20 Potential to Achieve Goals: Good    Frequency Min 3X/week   Barriers to discharge Decreased caregiver support Lives alone    Co-evaluation               AM-PAC PT "6 Clicks" Mobility  Outcome Measure Help needed turning from your back to your side while in a flat bed without using bedrails?: None Help needed moving from lying on your back to sitting on the side of a flat bed without using bedrails?: None Help needed moving to and from a bed to a chair (including a wheelchair)?: A Little Help needed standing up from a chair using your arms (e.g., wheelchair or bedside chair)?: A Little Help needed to walk in hospital room?: A Little Help needed climbing 3-5 steps with a railing? : A Little 6 Click Score: 20    End of Session   Activity Tolerance: Patient limited by fatigue Patient left: in bed;with call bell/phone within reach;with nursing/sitter in room Nurse Communication: Mobility status;Other (comment) ([place] out) PT Visit Diagnosis: Unsteadiness on feet (R26.81);Other abnormalities of gait and mobility (R26.89);Muscle weakness (generalized) (M62.81)    Time: 8270-7867 PT Time Calculation (min) (ACUTE ONLY): 15 min   Charges:   PT Evaluation $PT Eval Moderate Complexity: [place] Pager [phone] Office [place] 02/16/2020, 3:29 PM

## 2020-02-16 NOTE — Progress Notes (Addendum)
Progress Note  Patient Name: Crystal [name] Date of Encounter: 02/16/2020  [facility] Cardiologist: Rozann Lesches, MD   Subjective   Feeling much better this morning.   Inpatient Medications    Scheduled Meds: . aspirin EC  81 mg Oral q morning - 10a  . calcium-vitamin D  1 tablet Oral QAC lunch  . cholecalciferol  1,000 Units Oral Q breakfast  . clopidogrel  75 mg Oral q morning - 10a  . ferrous sulfate  325 mg Oral QAC lunch  . hydrALAZINE  25 mg Oral Q8H  . isosorbide mononitrate  30 mg Oral QHS  . metoprolol tartrate  12.5 mg Oral BID  . pantoprazole  40 mg Oral Daily  . rosuvastatin  2.5 mg Oral Q48H  . sodium chloride flush  3 mL Intravenous Q12H  . sodium chloride flush  3 mL Intravenous Q12H   Continuous Infusions: . sodium chloride    . amiodarone 30 mg/hr (02/16/20 0857)  . [START ON 02/18/2020] ferumoxytol    . heparin 1,200 Units/hr (02/16/20 0745)   PRN Meds: sodium chloride, acetaminophen, bisacodyl, nitroGLYCERIN, ondansetron (ZOFRAN) IV   Vital Signs    Vitals:   02/15/20 2109 02/15/20 2110 02/16/20 0037 02/16/20 0600  BP: 113/84  (!) 153/66 (!) 158/60  Pulse:  61 62 64  Resp:   18 15  Temp:  (!) 97.4 F (36.3 C) 97.9 F (36.6 C) 97.9 F (36.6 C)  TempSrc:  Oral Oral Oral  SpO2: 96%  97% 94%  Weight:    66.9 kg  Height:        Intake/Output Summary (Last 24 hours) at 02/16/2020 1015 Last data filed at 02/15/2020 1900 Gross per 24 hour  Intake 461.01 ml  Output -  Net 461.01 ml   Last 3 Weights 02/16/2020 02/14/2020 02/13/2020  Weight (lbs) 147 lb 6.4 oz 172 lb 1.6 oz 141 lb  Weight (kg) 66.86 kg 78.064 kg 63.957 kg      Telemetry    SR - Personally Reviewed  ECG    No new tracing  Physical Exam   GEN: No acute distress.   Neck: No JVD Cardiac: RRR, no murmurs, rubs, or gallops.  Respiratory: Clear to auscultation bilaterally. GI: Soft, nontender, non-distended  MS: No edema; No deformity. Neuro:  Nonfocal  Psych:  Normal affect   Labs    High Sensitivity Troponin:   Recent Labs  Lab 02/12/20 1726 02/12/20 2300 02/13/20 1126 02/13/20 1330  TROPONINIHS 254* 1,735* 1,229* 1,055*      Chemistry Recent Labs  Lab 02/13/20 1126 02/13/20 1428 02/13/20 1437 02/14/20 0915 02/15/20 0344  NA 134*   < > 132* 130* 130*  K 3.3*   < > 3.3* 4.3 4.1  CL 96*  --   --  97* 97*  CO2 24  --   --  22 22  GLUCOSE 120*  --   --  173* 95  BUN 24*  --   --  30* 35*  CREATININE 1.29*  --   --  1.59* 1.95*  CALCIUM 9.1  --   --  8.6* 8.9  GFRNONAA 41*  --   --  32* 25*  ANIONGAP 14  --   --  11 11   < > = values in this interval not displayed.     Hematology Recent Labs  Lab 02/15/20 0344 02/15/20 1528 02/16/20 0604  WBC 8.4 7.8 9.4  RBC 2.80* 3.13* 3.11*  HGB 7.9* 8.9* 8.8*  HCT 23.2* 26.2* 25.7*  MCV 82.9 83.7 82.6  MCH 28.2 28.4 28.3  MCHC 34.1 34.0 34.2  RDW 15.0 15.1 15.3  PLT 274 290 285    BNP Recent Labs  Lab 02/13/20 0355  BNP 4,423.6*     DDimer No results for input(s): DDIMER in the last 168 hours.   Radiology    No results found.  Cardiac Studies   Cath: 02/13/20   Non-stenotic Mid LAD lesion was previously treated.  Prox Cx to Mid Cx lesion is 20% stenosed.  Dist RCA lesion is 20% stenosed.  Non-stenotic Mid RCA lesion was previously treated.  Hemodynamic findings consistent with mild pulmonary hypertension.  LV end diastolic pressure is mildly elevated.  1. Nonobstructive CAD. Continued excellent patency of stents in the LAD and RCA 2. Mildly elevated LV filling pressures 3. Mildly elevated right heart pressures. 4. Normal cardiac output  Plan: continue medical therapy. Diuresis. BP control.  Diagnostic Dominance: Right    Patient Profile     86 y.o. female  with hx of CAD s/p DES to mLAD 01/2019 and recent inferior STEMI s/p DES to [facility] 12/2019, ICM with improved LVEF (50-55% EF by echo 12/2019), HLD (statin intolerance), HLD and CKD III  presented with few weeks hx of DOE, worsen in last day who found to have elevated enzyme andBNP.   Assessment & Plan    1. NSTEMI:presented with dyspnea on exertion, worse the day prior to admission. hsTn peaked at 1735. Underwent cardiac cath noted above with patent stents in the RCA and LAD. Recommendation to continue medical therapy on ASA and plavix. Plan to stop ASA with the need for [facility] now.  -- on BB, Imdur  2. Chronic systolic WVP:XTGG with stable EF at 50-55% and G1DD.BNP >4000. Elevated filling pressures on cath. Given IV lasix with little diuresis, and rise in Cr. No further diuresis. Does not appear volume overloaded on exam.   3. HTN: Blood pressures elevated this morning. Will further increase metoprolol to 25mg  BID -- continue hydralazine 25mg  TID  4. HLD:04/13/2019-Cholesterol 155; HDL 56; LDL Cholesterol 77; VLDL 22 01/17/2020: Triglycerides 156 --Statinsintolerance. Can't afford PCSK9 inhibitor. Recently started onPitavastatin. Reports it was expensive for her.  -- She is agreeable to crestor 2.5mg  every other day  5. CKD III:Cr 1.95 yesterday. Holding additional diuresis. -- BMET pending this morning.  6. ID Anemia/ + Occult stools: 10.2>>8.5>>7.9 yesterday. this morning. Iron was low on panel. S/p IV Iron x1. Occult stool + . GI consulted with recommendations for conservative management and monitoring CBC.  -- Hgb 8.8 this morning.  -- daily CBC  7. New onset Afib RVR: converted into Afib the morning of 1/19 at 4:30. She was somewhat symptomatic with this. Given IV amiodarone with conversion to SR.  -- will transition to amiodarone 400mg  BID -- CHA2DS2-VASc Score of at least 4.Will start on Eliquis 2.5mg  BID (dose reduced, age and Cr) with close monitoring of CBC.   For questions or updates, please contact [place] Please consult [email or web address] for contact info under      Signed, Reino Bellis, NP  02/16/2020, 10:15 AM    Patient seen and  examined. Agree with assessment and plan.  Patient feels slightly better today.  She does not have an appetite.  She is maintaining sinus rhythm with IV amiodarone.  Will transition to 400 mg twice daily for several days and then reduce to 200 mg twice a day.  We will try initiating very low-dose eliquis particularly  with age greater than 34 and creatinine greater than 1.5.  We will need to monitor GI blood loss.  DC aspirin.  Appreciate GI evaluation.  Troy Sine, MD, [facility] 02/16/2020 12:08 PM  Addendum: Laboratory chest back after I saw the patient.  Creatinine is now increased to 9 6 with BUN 46.  Patient has poor appetite.  Will initiate IV fluids with normal saline.  She is not on any nephrotoxic meds.  Follow-up laboratory in a.m.

## 2020-02-16 NOTE — Progress Notes (Signed)
ANTICOAGULATION CONSULT NOTE - Follow Up Consult  Pharmacy Consult for heparin Indication: atrial fibrillation  Labs: Recent Labs    02/13/20 1126 02/13/20 1330 02/13/20 1428 02/14/20 0339 02/14/20 0915 02/15/20 0344 02/15/20 1528 02/16/20 0010  HGB  --   --    < > 8.5*  --  7.9* 8.9*  --   HCT  --   --    < > 24.6*  --  23.2* 26.2*  --   PLT  --   --   --  299  --  274 290  --   LABPROT  --   --   --  14.2  --   --   --   --   INR  --   --   --  1.1  --   --   --   --   HEPARINUNFRC  --  0.18*  --   --   --   --  0.17* 0.10*  CREATININE 1.29*  --   --   --  1.59* 1.95*  --   --   TROPONINIHS 1,229* 1,055*  --   --   --   --   --   --    < > = values in this interval not displayed.    Assessment: 86yo female remains subtherapeutic on heparin with lower level despite increased rate, possibly d/t bolus w/ initial dosing; no gtt issues or signs of bleeding per RN.  Goal of Therapy:  Heparin level 0.3-0.7 units/ml   Plan:  Will increase heparin gtt by 3 units/kg/hr to 1200 units/hr and check level in 8 hours.    Wynona Neat, PharmD, BCPS  02/16/2020,1:30 AM

## 2020-02-16 NOTE — Discharge Instructions (Addendum)
Preventing Heart Failure Heart failure is a condition in which the heart has trouble pumping blood. This may mean that the heart cannot pump enough blood out to the body or that the heart does not fill up with enough blood. Either of those problems can lead to symptoms such as tiredness (fatigue), trouble breathing, and swelling throughout the body. This is a common medical condition that affects not only the heart, but the entire body. Making certain nutrition and lifestyle changes can help you prevent heart failure and avoid serious health problems. How can this condition affect me? Heart failure can cause very serious problems that may get worse over time, such as:  Extreme fatigue during normal physical activities.  Shortness of breath or trouble breathing.  Swelling in your abdomen, legs, ankles, feet, or neck.  Fluid buildup throughout the body.  Weight gain.  Cough.  Frequent urination. What can increase my risk? The risk of heart failure increases as a person ages. The following factors may also make you more likely to develop this condition:  Being obese.  Being female.  Using tobacco or nicotine products.  Abusing alcohol or drugs.  Having taken medicines that can damage the heart, such as chemotherapy drugs.  Have a family history of heart failure.  Having any of these medical conditions: ? High blood pressure (hypertension). ? Coronary artery disease. ? Diabetes. ? Abnormal heart rhythms. ? Thyroid problems. ? Low blood counts (anemia). ? Lung disease. ? Chronic kidney disease. What actions can I take to prevent heart failure? Nutrition  If you are overweight or obese, reduce how many calories you eat each day so that you lose weight. Work with your health care provider or a diet and nutrition specialist (dietitian) to determine how many calories you need each day.  Eat foods that are low in salt (sodium). Avoid adding extra salt to foods. This can help  keep your blood pressure in a normal range.  Eat a well-balanced diet that includes a lot of: ? Plant-based foods. ? Fresh fruits and vegetables. ? Whole grains. ? Lean meats. ? Beans. ? Fat-free or low-fat dairy products.  Avoid foods that contain a lot of: ? Trans fats. ? Saturated fats. ? Sugar. ? Cholesterol.   Alcohol  Do not drink alcohol if: ? Your health care provider tells you not to drink. ? You are pregnant, may be pregnant, or are planning to become pregnant.  If you drink alcohol: ? Limit how much you have to:  0-1 drink a day for women.  0-2 drinks a day for men. ? Know how much alcohol is in your drink. In the [place] one drink equals one 12 oz bottle of beer (355 mL), one 5 oz glass of wine (148 mL), or one 1 oz glass of hard liquor (44 mL). Lifestyle  Do not use any products that contain nicotine or tobacco. These products include cigarettes, chewing tobacco, and vaping devices, such as e-cigarettes. If you need help quitting, ask your health care provider.  Exercise for at least 30 minutes, 5 days each week, or as much as told by your health care provider. ? Do moderate-intensity exercise, such as brisk walking, bicycling, or water aerobics. ? Ask your health care provider which activities are safe for you.  Try to get 7-9 hours of sleep each night. To help with sleep: ? Keep your bedroom cool and dark. ? Do not eat a heavy meal during the hour before you go to bed. ?  Do not drink alcohol or caffeinated drinks before bed. ? Avoid screen time before bedtime. This means avoiding television, computers, tablets, and mobile phones.  Find ways to relax and manage stress. These may include: ? Breathing exercises. ? Meditation. ? Yoga. ? Listening to music.   General instructions  See a health care provider regularly for screening and wellness checks. Work with your health care provider to manage your: ? Blood pressure. ? Cholesterol levels. ? Blood sugar  (glucose) levels. ? Weight.  If you have diabetes, manage your condition and follow your treatment plan as instructed. Where to find more information  National Heart, Lung, and Blood Institute: https://wilson-eaton.com/  Centers for Disease Control and Prevention: http://www.wolf.info/  National Institute on Aging: http://kim-miller.com/  American Heart Association: www.heart.org Contact a health care provider if:  You have rapid weight gain.  You have increasing shortness of breath that is unusual for you.  You tire easily or you are unable to participate in your usual activities.  You cough more than normal, especially with physical activity.  You have any swelling or more swelling in areas such as your hands, feet, ankles, or abdomen. Get help right away if:  You have trouble breathing.  You have pain or discomfort in your chest.  You faint. These symptoms may represent a serious problem that is an emergency. Do not wait to see if the symptoms will go away. Get medical help right away. Call your local emergency services (911 in the [place] Do not drive yourself to the hospital. Summary  Heart failure can cause very serious problems that may get worse over time.  Heart failure can be prevented by making changes to your diet and lifestyle.  It is important to eat a healthy diet, manage your weight, exercise regularly, manage stress, avoid drugs and alcohol, and keep your cholesterol and blood pressure under control. This information is not intended to replace advice given to you by your health care provider. Make sure you discuss any questions you have with your health care provider. Document Revised: 08/06/2019 Document Reviewed: 08/06/2019 Elsevier Patient Education  [place] on my medicine - ELIQUIS (apixaban)  Why was Eliquis prescribed for you? Eliquis was prescribed for you to reduce the risk of a blood clot forming that can cause a stroke if you have a medical  condition called atrial fibrillation (a type of irregular heartbeat).  What do You need to know about Eliquis ? Take your Eliquis TWICE DAILY - one tablet in the morning and one tablet in the evening with or without food. If you have difficulty swallowing the tablet whole please discuss with your pharmacist how to take the medication safely.  Take Eliquis exactly as prescribed by your doctor and DO NOT stop taking Eliquis without talking to the doctor who prescribed the medication.  Stopping may increase your risk of developing a stroke.  Refill your prescription before you run out.  After discharge, you should have regular check-up appointments with your healthcare provider that is prescribing your Eliquis.  In the future your dose may need to be changed if your kidney function or weight changes by a significant amount or as you get older.  What do you do if you miss a dose? If you miss a dose, take it as soon as you remember on the same day and resume taking twice daily.  Do not take more than one dose of ELIQUIS at the same time to make up a missed dose.  Important Safety Information A possible side effect of Eliquis is bleeding. You should call your healthcare provider right away if you experience any of the following: ? Bleeding from an injury or your nose that does not stop. ? Unusual colored urine (red or dark brown) or unusual colored stools (red or black). ? Unusual bruising for unknown reasons. ? A serious fall or if you hit your head (even if there is no bleeding).  Some medicines may interact with Eliquis and might increase your risk of bleeding or clotting while on Eliquis. To help avoid this, consult your healthcare provider or pharmacist prior to using any new prescription or non-prescription medications, including herbals, vitamins, non-steroidal anti-inflammatory drugs (NSAIDs) and supplements.  This website has more information on Eliquis (apixaban):  http://www.eliquis.com/eliquis/home

## 2020-02-16 NOTE — Progress Notes (Addendum)
Daily Rounding Note  02/16/2020, 10:23 AM  LOS: 3 days   SUBJECTIVE:   Chief complaint:   Anemia, FOBT + stools   Yesterday felt queasy, then nausea and dry heaves.  Loose dark stools as well.  Dosed w Zofran x 1 and nausea resolved, no recurrence.  Tolerated solid food this AM.  No diarrhea this AM.  OBJECTIVE:         Vital signs in last 24 hours:    Temp:  [97.4 F (36.3 C)-97.9 F (36.6 C)] 97.9 F (36.6 C) (01/20 0600) Pulse Rate:  [61-64] 64 (01/20 0600) Resp:  [15-18] 15 (01/20 0600) BP: (113-158)/(60-84) 158/60 (01/20 0600) SpO2:  [94 %-97 %] 94 % (01/20 0600) Weight:  [66.9 kg] 66.9 kg (01/20 0600) Last BM Date: 02/15/20 Filed Weights   02/13/20 0406 02/14/20 0500 02/16/20 0600  Weight: 64 kg 78.1 kg 66.9 kg   General: looks tired but not acutely ill or toxic   Heart: RRR.  NSR in 60s on tele Chest: clear bil.  No cough or dyspnea Abdomen: soft, active BS.  NT.  ND  Extremities: no CCE Neuro/Psych:  Oriented x 3.  Fully alert and providing good hx.  No tremors.    Intake/Output from previous day: 01/19 0701 - 01/20 0700 In: 461 [I.V.:461] Out: -   Intake/Output this shift: No intake/output data recorded.  Lab Results: Recent Labs    02/15/20 0344 02/15/20 1528 02/16/20 0604  WBC 8.4 7.8 9.4  HGB 7.9* 8.9* 8.8*  HCT 23.2* 26.2* 25.7*  PLT 274 290 285   BMET Recent Labs    02/13/20 1126 02/13/20 1428 02/13/20 1437 02/14/20 0915 02/15/20 0344  NA 134*   < > 132* 130* 130*  K 3.3*   < > 3.3* 4.3 4.1  CL 96*  --   --  97* 97*  CO2 24  --   --  22 22  GLUCOSE 120*  --   --  173* 95  BUN 24*  --   --  30* 35*  CREATININE 1.29*  --   --  1.59* 1.95*  CALCIUM 9.1  --   --  8.6* 8.9   < > = values in this interval not displayed.   LFT No results for input(s): PROT, ALBUMIN, AST, ALT, ALKPHOS, BILITOT, BILIDIR, IBILI in the last 72 hours. PT/INR Recent Labs    02/14/20 0339   LABPROT 14.2  INR 1.1   Hepatitis Panel No results for input(s): HEPBSAG, HCVAB, HEPAIGM, HEPBIGM in the last 72 hours.  Studies/Results: No results found.   Scheduled Meds: . amiodarone  400 mg Oral BID  . apixaban  2.5 mg Oral BID  . calcium-vitamin D  1 tablet Oral QAC lunch  . cholecalciferol  1,000 Units Oral Q breakfast  . clopidogrel  75 mg Oral q morning - 10a  . ferrous sulfate  325 mg Oral QAC lunch  . hydrALAZINE  25 mg Oral Q8H  . isosorbide mononitrate  30 mg Oral QHS  . metoprolol tartrate  25 mg Oral BID  . pantoprazole  40 mg Oral Daily  . rosuvastatin  2.5 mg Oral Q48H  . sodium chloride flush  3 mL Intravenous Q12H  . sodium chloride flush  3 mL Intravenous Q12H   Continuous Infusions: . sodium chloride    . amiodarone 30 mg/hr (02/16/20 0857)  . [START ON 02/18/2020] ferumoxytol     PRN Meds:.sodium chloride, acetaminophen,  bisacodyl, nitroGLYCERIN, ondansetron (ZOFRAN) IV   ASSESMENT:   *   [place] anemia.  FOBT + in pt on DAPT.  S/p Feraheme 1/18.  Continues on home dose of po Fe SO4 325 mg/daily.  Oral iron can cause false FOBT positive.  No overt GI bleeding.   Hgb 7.9 >> 8.8.  No PRBCs to date.    *   Left-sided chest and upper extremity pain cardiac stents 01/2019, 12/2019.  Nonobstructive CAD and patent stents by 02/13/2020 cardiac cath.  *   Intermittent diarrhea.  Treats w prn fiber cookies, florastor and Imodium.    *   Chronic DAPT.  Not on hold.    *   A fib, RVR new onset.  Heparin , amiodarone infusion in place  *   GERD.   Erosive gastritis per EGD of 09/2019.  Daily Protonix 40 mg/day continues.    *    Hx dysphagia.  S/p dilation of nonobstructing Schatzki's ring in 09/2019.  Moderate esophageal dysmotility per esophagram of 01/31/2019  *     Hyponatremia.  *   AKI with baseline stage 4 CKD, contributes to anemia.      PLAN   *   Continue Plavix.  Low dose ASA now stopped and begins Eliquis this AM.    *   Continue Protonix 40  mg daily indefinitely.   While inpatient will add daily FiberCon and Florastor, as needed Imodium  *   GI available prn.  Once discharged can fup prn w [place]  02/16/2020, 10:23 AM Phone [phone]  I have discussed the case with the PA, and that is the plan I formulated. I personally interviewed and examined the patient.  Patient stable from a GI bleeding standpoint, still has not had any overt bleeding.  Hemoglobin remaining stable.  Stomach upset today, has lost her appetite somewhat.  Chronic iron deficiency anemia, most likely largely due to chronic kidney disease.  Acute drop in hemoglobin during his hospital stay for NSTEMI and A. fib, but without overt GI bleeding.  Heme positive, significance of which related to this anemia still unclear.  No current plans for endoscopic evaluation given her active cardiac issues.  She should have office follow-up with her primary GI physician, Dr. Gala Romney in [place], about 4 to 6 weeks after hospital discharge.  We will sign off for now, please feel free to call if needed for other issues during this hospital stay.  Total time 25 minutes   Nelida Meuse III Office: [phone]

## 2020-02-17 ENCOUNTER — Inpatient Hospital Stay (HOSPITAL_COMMUNITY): Payer: Medicare Other

## 2020-02-17 DIAGNOSIS — I214 Non-ST elevation (NSTEMI) myocardial infarction: Secondary | ICD-10-CM

## 2020-02-17 DIAGNOSIS — R195 Other fecal abnormalities: Secondary | ICD-10-CM | POA: Diagnosis not present

## 2020-02-17 DIAGNOSIS — I340 Nonrheumatic mitral (valve) insufficiency: Secondary | ICD-10-CM | POA: Diagnosis not present

## 2020-02-17 DIAGNOSIS — I351 Nonrheumatic aortic (valve) insufficiency: Secondary | ICD-10-CM | POA: Diagnosis not present

## 2020-02-17 DIAGNOSIS — I361 Nonrheumatic tricuspid (valve) insufficiency: Secondary | ICD-10-CM | POA: Diagnosis not present

## 2020-02-17 DIAGNOSIS — I2510 Atherosclerotic heart disease of native coronary artery without angina pectoris: Secondary | ICD-10-CM | POA: Diagnosis not present

## 2020-02-17 DIAGNOSIS — I5043 Acute on chronic combined systolic (congestive) and diastolic (congestive) heart failure: Secondary | ICD-10-CM | POA: Diagnosis not present

## 2020-02-17 DIAGNOSIS — N179 Acute kidney failure, unspecified: Secondary | ICD-10-CM

## 2020-02-17 DIAGNOSIS — J90 Pleural effusion, not elsewhere classified: Secondary | ICD-10-CM

## 2020-02-17 LAB — BASIC METABOLIC PANEL
Anion gap: 12 (ref 5–15)
BUN: 50 mg/dL — ABNORMAL HIGH (ref 8–23)
CO2: 20 mmol/L — ABNORMAL LOW (ref 22–32)
Calcium: 8.4 mg/dL — ABNORMAL LOW (ref 8.9–10.3)
Chloride: 94 mmol/L — ABNORMAL LOW (ref 98–111)
Creatinine, Ser: 3.23 mg/dL — ABNORMAL HIGH (ref 0.44–1.00)
GFR, Estimated: 14 mL/min — ABNORMAL LOW (ref >60)
Glucose, Bld: 112 mg/dL — ABNORMAL HIGH (ref 70–99)
Potassium: 4.9 mmol/L (ref 3.5–5.1)
Sodium: 126 mmol/L — ABNORMAL LOW (ref 135–145)

## 2020-02-17 LAB — ECHOCARDIOGRAM LIMITED
Area-P 1/2: 2.2 cm2
Height: 65 in
S' Lateral: 3.5 cm
Weight: 2359.8 [oz_av]

## 2020-02-17 LAB — GLUCOSE, CAPILLARY: Glucose-Capillary: 139 mg/dL — ABNORMAL HIGH (ref 70–99)

## 2020-02-17 LAB — CBC
HCT: 23.8 % — ABNORMAL LOW (ref 36.0–46.0)
Hemoglobin: 8.2 g/dL — ABNORMAL LOW (ref 12.0–15.0)
MCH: 28.5 pg (ref 26.0–34.0)
MCHC: 34.5 g/dL (ref 30.0–36.0)
MCV: 82.6 fL (ref 80.0–100.0)
Platelets: 233 10*3/uL (ref 150–400)
RBC: 2.88 MIL/uL — ABNORMAL LOW (ref 3.87–5.11)
RDW: 15.3 % (ref 11.5–15.5)
WBC: 8.2 10*3/uL (ref 4.0–10.5)
nRBC: 0 % (ref 0.0–0.2)

## 2020-02-17 MED ORDER — AMIODARONE HCL 200 MG PO TABS
200.0000 mg | ORAL_TABLET | Freq: Two times a day (BID) | ORAL | Status: DC
  Administered 2020-02-17 – 2020-02-19 (×6): 200 mg via ORAL
  Filled 2020-02-17 (×6): qty 1

## 2020-02-17 NOTE — Progress Notes (Signed)
   02/17/20 1438  Mobility  Activity Refused mobility (Patient declined. Complaining of nausea)

## 2020-02-17 NOTE — Progress Notes (Addendum)
Daily Rounding Note  02/17/2020, 10:41 AM  LOS: 4 days   SUBJECTIVE:   Chief complaint: Dark stool.  Previous FOBT positive stool.  Recontacted by cardiology because her stool is dark today. Hgb is 8.2.   Yesterday had an episode of pill dysphagia and ended up vomiting/regurgitating.  She did fine with swallowing a pill administered with applesauce today.  There was no nausea.  Patient occasionally has dysphagia at home as well.  Stools are loose, this morning's was dark brown.  Nurse said it did not look bloody or melenic.  Endorses the fact that at home because she takes iron, her stools are always on the dark-colored spectrum  OBJECTIVE:         Vital signs in last 24 hours:    Temp:  [98.1 F (36.7 C)] 98.1 F (36.7 C) (01/21 0542) Pulse Rate:  [60-65] 64 (01/21 0954) Resp:  [15] 15 (01/21 0542) BP: (140-152)/(54-57) 150/57 (01/21 0954) SpO2:  [94 %-98 %] 98 % (01/21 0542) Weight:  [66.9 kg] 66.9 kg (01/21 0940) Last BM Date: 02/15/20 Filed Weights   02/14/20 0500 02/16/20 0600 02/17/20 0940  Weight: 78.1 kg 66.9 kg 66.9 kg   General: Looks moderately ill but not toxic Heart:  Sinus rhythm at 63 on telemetry monitor. Chest: Clear bilaterally.  No labored breathing or cough. Abdomen: Soft without tenderness.  No masses, no distention. Rectal: The stool is light, greenish-brown.  Color is that which would result from mixing pesto with mustard.  Possibly trace heme positive but with the green color it is hard to discern subtle shade of blue Extremities: No CCE. Neuro/Psych: Oriented x3.  Alert.  Good historian.  Pleasant.  Fluid speech.  Intake/Output from previous day: 01/20 0701 - 01/21 0700 In: 1185 [P.O.:360; I.V.:825] Out: 250 [Urine:250]  Intake/Output this shift: No intake/output data recorded.  Lab Results: Recent Labs    02/16/20 0604 02/16/20 2054 02/17/20 0302  WBC 9.4 9.4 8.2  HGB 8.8* 8.4*  8.2*  HCT 25.7* 25.9* 23.8*  PLT 285 257 233   BMET Recent Labs    02/15/20 0344 02/16/20 0942 02/17/20 0302  NA 130* 130* 126*  K 4.1 4.9 4.9  CL 97* 94* 94*  CO2 22 22 20*  GLUCOSE 95 182* 112*  BUN 35* 46* 50*  CREATININE 1.95* 2.96* 3.23*  CALCIUM 8.9 9.0 8.4*   LFT No results for input(s): PROT, ALBUMIN, AST, ALT, ALKPHOS, BILITOT, BILIDIR, IBILI in the last 72 hours. PT/INR No results for input(s): LABPROT, INR in the last 72 hours. Hepatitis Panel No results for input(s): HEPBSAG, HCVAB, HEPAIGM, HEPBIGM in the last 72 hours.  Studies/Results: No results found.   Scheduled Meds: . amiodarone  200 mg Oral BID  . calcium-vitamin D  1 tablet Oral QAC lunch  . cholecalciferol  1,000 Units Oral Q breakfast  . clopidogrel  75 mg Oral q morning - 10a  . ferrous sulfate  325 mg Oral QAC lunch  . hydrALAZINE  25 mg Oral Q8H  . isosorbide mononitrate  30 mg Oral QHS  . metoprolol tartrate  25 mg Oral BID  . pantoprazole  40 mg Oral Daily  . polycarbophil  625 mg Oral Daily  . rosuvastatin  2.5 mg Oral Q48H  . saccharomyces boulardii  250 mg Oral BID  . sodium chloride flush  3 mL Intravenous Q12H  . sodium chloride flush  3 mL Intravenous Q12H   Continuous Infusions: .  sodium chloride    . sodium chloride    . [START ON 02/18/2020] ferumoxytol     PRN Meds:.sodium chloride, sodium chloride, acetaminophen, alum & mag hydroxide-simeth, bisacodyl, magnesium hydroxide, nitroGLYCERIN, ondansetron (ZOFRAN) IV, sodium chloride flush, sodium chloride flush   ASSESMENT:   *    FOBT positive stools, dark stools.  Resolved nausea. Stool today was dark brown earlier, by my DRE it is light greenish tan-brown and it is hard to discern whether or not it is FOBT positive  *     Episode of pill dysphagia A/W regurgitation.  Yesterday, none this morning when she was administered pill with applesauce.  Occasional dysphagia similar to this at home.  *    Normocytic anemia.   Received Feraheme 1/18.  Continues on chronic, home dose, iron sulfate.  *    DAPT.  *    A. fib, RVR, new onset.  IV heparin discontinued yesterday at 1:30 PM.  Received 2 doses of Eliquis yesterday but now discontinued.   PLAN   *   Okay to resume Eliquis.   Continue Protonix 40 mg daily.    Azucena Freed  02/17/2020, 10:41 AM Phone [phone]  This patient was off the floor having an echocardiogram, so I was unable to see her.  Exam by our PA earlier today indicates patient still does not have overt GI bleeding.  Her reported "dark stool" has not been indicative of overt GI bleeding during this hospital stay.  Subjective reports of dark stool are likely related to dietary factors and taking oral iron. Still not currently planning endoscopic testing for her heme positive stool in the setting of MI and A. Fib.  I agree with continuing [place].  She had a dose of IV heparin earlier this hospital stay, which should help improve her hemoglobin in the next few weeks.  We will sign off and you may call again as the need arises.

## 2020-02-17 NOTE — Progress Notes (Signed)
Night RN performed 2 bladder scans on pt. She reported to day RN first bladder scan was 1/20 1930 at 19 mL. Second scan was 1/21 0600 at 55 mL. Very low output during night shift.

## 2020-02-17 NOTE — Progress Notes (Addendum)
Progress Note  Patient Name: Crystal [name] Date of Encounter: 02/17/2020  Primary Cardiologist: Rozann Lesches, MD  Subjective   Feeling slightly better today, a little less nauseated. She is concerned about mild edema developing in her legs. Also reports dark stools this morning again. Decreased UOP as well.  Inpatient Medications    Scheduled Meds:  amiodarone  400 mg Oral BID   apixaban  2.5 mg Oral BID   calcium-vitamin D  1 tablet Oral QAC lunch   cholecalciferol  1,000 Units Oral Q breakfast   clopidogrel  75 mg Oral q morning - 10a   ferrous sulfate  325 mg Oral QAC lunch   hydrALAZINE  25 mg Oral Q8H   isosorbide mononitrate  30 mg Oral QHS   metoprolol tartrate  25 mg Oral BID   pantoprazole  40 mg Oral Daily   polycarbophil  625 mg Oral Daily   rosuvastatin  2.5 mg Oral Q48H   saccharomyces boulardii  250 mg Oral BID   sodium chloride flush  3 mL Intravenous Q12H   sodium chloride flush  3 mL Intravenous Q12H   Continuous Infusions:  sodium chloride     sodium chloride     sodium chloride 75 mL/hr at 02/17/20 0141   [START ON 02/18/2020] ferumoxytol     PRN Meds: sodium chloride, sodium chloride, acetaminophen, alum & mag hydroxide-simeth, bisacodyl, magnesium hydroxide, nitroGLYCERIN, ondansetron (ZOFRAN) IV, sodium chloride flush, sodium chloride flush   Vital Signs    Vitals:   02/16/20 1036 02/16/20 1456 02/16/20 1930 02/17/20 0542  BP: (!) 138/47 (!) 140/57 (!) 152/55 (!) 145/54  Pulse: 64  65 60  Resp:   15 15  Temp:   98.1 F (36.7 C) 98.1 F (36.7 C)  TempSrc:   Oral Oral  SpO2:   94% 98%  Weight:      Height:        Intake/Output Summary (Last 24 hours) at 02/17/2020 0902 Last data filed at 02/17/2020 0600 Gross per 24 hour  Intake 1185 ml  Output 250 ml  Net 935 ml   Last 3 Weights 02/16/2020 02/14/2020 02/13/2020  Weight (lbs) 147 lb 6.4 oz 172 lb 1.6 oz 141 lb  Weight (kg) 66.86 kg 78.064 kg 63.957 kg      Telemetry    NSR - Personally Reviewed  Physical Exam   GEN: No acute distress.  HEENT: Normocephalic, atraumatic, sclera non-icteric. Neck: No JVD or bruits. Cardiac: RRR no murmurs, rubs, or gallops.  Radials/DP/PT 1+ and equal bilaterally.  Respiratory: Decreased BS at right base, otherwise no wheezing, rales or rhonchi. Breathing is unlabored. GI: Soft, nontender, non-distended, BS +x 4. MS: no deformity. Extremities: No clubbing or cyanosis. Trace pedal edema bilaterally. Distal pedal pulses are 2+ and equal bilaterally. Right radial cath site without hematoma or ecchymosis; good pulse. Neuro:  AAOx3. Follows commands. Psych:  Responds to questions appropriately with a normal affect.  Labs    High Sensitivity Troponin:   Recent Labs  Lab 02/12/20 1726 02/12/20 2300 02/13/20 1126 02/13/20 1330  TROPONINIHS 254* 1,735* 1,229* 1,055*      Cardiac EnzymesNo results for input(s): TROPONINI in the last 168 hours. No results for input(s): TROPIPOC in the last 168 hours.   Chemistry Recent Labs  Lab 02/15/20 0344 02/16/20 0942 02/17/20 0302  NA 130* 130* 126*  K 4.1 4.9 4.9  CL 97* 94* 94*  CO2 22 22 20*  GLUCOSE 95 182* 112*  BUN 35*  46* 50*  CREATININE 1.95* 2.96* 3.23*  CALCIUM 8.9 9.0 8.4*  GFRNONAA 25* 15* 14*  ANIONGAP 11 14 12      Hematology Recent Labs  Lab 02/16/20 0604 02/16/20 2054 02/17/20 0302  WBC 9.4 9.4 8.2  RBC 3.11* 3.12* 2.88*  HGB 8.8* 8.4* 8.2*  HCT 25.7* 25.9* 23.8*  MCV 82.6 83.0 82.6  MCH 28.3 26.9 28.5  MCHC 34.2 32.4 34.5  RDW 15.3 15.2 15.3  PLT 285 257 233    BNP Recent Labs  Lab 02/13/20 0355  BNP 4,423.6*     DDimer No results for input(s): DDIMER in the last 168 hours.   Radiology    No results found.  Cardiac Studies   Cardiac Cath 02/13/20   Non-stenotic Mid LAD lesion was previously treated.  Prox Cx to Mid Cx lesion is 20% stenosed.  Dist RCA lesion is 20% stenosed.  Non-stenotic Mid RCA  lesion was previously treated.  Hemodynamic findings consistent with mild pulmonary hypertension.  LV end diastolic pressure is mildly elevated.   1. Nonobstructive CAD. Continued excellent patency of stents in the LAD and RCA 2. Mildly elevated LV filling pressures 3. Mildly elevated right heart pressures. 4. Normal cardiac output  Plan: continue medical therapy. Diuresis. BP control.  2D Echo 01/17/20  1. Left ventricular ejection fraction, by estimation, is 50 to 55%. The  left ventricle has low normal function. The left ventricle demonstrates  regional wall motion abnormalities (see scoring diagram/findings for  description). There is moderate left  ventricular hypertrophy. Left ventricular diastolic parameters are  consistent with Grade I diastolic dysfunction (impaired relaxation).  Elevated left ventricular end-diastolic pressure. The E/e' is 73. There is  moderate hypokinesis of the left  ventricular, basal-mid inferior wall.  2. Right ventricular systolic function is hyperdynamic. The right  ventricular size is normal. There is normal pulmonary artery systolic  pressure.  3. Left atrial size was moderately dilated.  4. The mitral valve is abnormal. Trivial mitral valve regurgitation.  5. The aortic valve is tricuspid. Aortic valve regurgitation is not  visualized.  6. The inferior vena cava is normal in size with <50% respiratory  variability, suggesting right atrial pressure of 8 mmHg.   Comparison(s): No significant change from prior study. 01/16/20: LVEF  50-55%.   Patient Profile     86 y.o. female with CAD s/p DES to mLAD 01/2019 and recent inferior STEMI s/p DES to [facility] 12/2019, prior ICM with improved LVEF (50-55% EF by echo 12/2019), chronic combined CHF, HLD (statin intolerance), HLD and CKD III presented with few weeks hx of DOE, found to have NSTEMI with acut eon chronic systolic CHF. Hospital course complicated by AKI on CKD, iron deficiency anemia  with heme positive stools, and new onset atrial fib RVR.  Assessment & Plan    1. Shortness of breath - presented with findings suspicious for NSTEMI - hsTn peaked at 1735. Underwent cardiac cath noted above with patent stents in the RCA and LAD. Recommendation to continue medical therapy on ASA and Plavix. Aspirin was stopped initially due to need for addition of Eliquis but that is also now being stopped due to declining hemoglobin and concerns over GI bleeding. Per d/w Dr. Claiborne Billings, Plavix monotherapy for now -onBB, Imdur  2. Acute on chronic combined CHF - last EF assessed 01/17/20 at 50-55%, but this was prior to this admission - will order repeat limited echo - BNP >4000 with elevated filling pressures on cath.Given IV lasix with little diuresis, and  rise in Cr -> subsequently treated with IV fluids yesterday but patient reports she now feels she is becoming swollen - decreased BS noted at R lung base - repeat 2V CXR today  3. AKI on CKD III with worsening hyponatremia - baseline Cr quite variable recently anywhere from 1.3-1.9, admitted at 1.66 -> continues to rise, 2.96 then 3.23 also with dropping sodium level at 126 - pause IVF given increasing edema and decreasing sodium level - will order renal US - bladder scans with minimal residual per nurse - consulted nephrology   3.IDAnemia/ + Occult stools:10.2>>8.5>>7.9 - basliene hemoglobin appeared around 9-10 recently, nadir 7.9 this admission, with hemoglobin in the low-mid 8 range now - s/p IV iron x1, occult stool positive  - GI consulted with recommendations for conservative management - they were aware of dark stools/+FOBT but felt anemia was largely due to her chronic kidney disease - continue PPI - see below regarding Eliquis - continue daily CBC and appreciate nephro input on optimization as well  4. New onset Afib RVR:  - briefly noted this admission on 02/15/20, converted to NSR quickly after use of IV amiodarone -  will decrease oral amiodarone to 200mg  BID given potential GI side effects of nausea/dry heaving - per d/w Dr. Claiborne Billings, stop Eliquis given risk outweighs benefit with anemia/GI bleeding issues, worsening renal function and very transient nature of arrhythmia - can revisit if this recurs  5. HTN - managed in context of the above - metoprolol titrated yesterday, also on Imdur, hydralazine - systolic mildly elevated but diastolics remain fairly low so would not push further control at this time given worsening renal dysfunction - patient unhappy with hydralazine but alternatives otherwise limited at this time given her renal issues  6. HLD - prior statin intolerance - pitavastatin and PCSK9 too expensive, agreeable to Crestor 2.5mg  every other day - lipid profile this admission with LDL 83, trig 74, HDL 56  For questions or updates, please contact [place] Please consult [email or web address] for contact info under Cardiology/STEMI.  Signed, Charlie Pitter, PA-C 02/17/2020, 9:02 AM       Patient seen and examined. Agree with assessment and plan.  Maintaining sinus rhythm at 64 bpm.  Hemoglobin today has dropped.  Will DC Eliquis and DC aspirin continue Plavix with most recent stent placed in December 2021.  Creatinine is further increased today 3.23.  Nephrology has been consulted.  We will obtain renal ultrasound.  Bladder scan did not show any significant residual.  Blood pressure today 145/54.  Chest x-ray done today shows small bilateral pleural effusions and mild left basilar subsegmental atelectasis or infiltrate.  Patient admits to shortness of breath.  We will follow-up BNP and recheck 2D echo Doppler study.   Troy Sine, MD, [facility] 02/17/2020 11:46 AM

## 2020-02-17 NOTE — Care Management Important Message (Signed)
Important Message  Patient Details  Name: Crystal [name] MRN: [id number] Date of Birth: 07/11/1934   Medicare Important Message Given:  Yes     [name] 02/17/2020, 11:17 AM

## 2020-02-17 NOTE — Progress Notes (Signed)
  Echocardiogram 2D Echocardiogram has been performed.  Crystal [name] 02/17/2020, 2:24 PM

## 2020-02-17 NOTE — Consult Note (Addendum)
[facility] Consultation Note  Requesting MD: Dr Lyman Bishop Reason for consult: AKI on CKD  HPI:  Crystal [name] is a 86 y.o. female with history of hypertension, HLD, CKD 3 with baseline creatinine level around 1.3-1.9, chronic combined CHF, CAD who was admitted on 1/17 for acute CHF exacerbation and an NSTEMI, seen as a consultation at the request of Dr. Debara Pickett for the evaluation of acute kidney injury on CKD. On arrival to the ER, patient was hypoxic, hypertensive with elevated BNP.  The serum creatinine level was 1.66 on admission.  Treated with IV diuretics with improvement of her symptoms.  The troponin level was elevated therefore patient underwent cardiac cath on 1/17 with placement of stent in RCA and LAD.  Subsequently the creatinine level started going up to 3.23 today.  The Lasix was held and then treated with IV fluids briefly.  Now she developed some ankle edema therefore IV fluid has stopped. During hospitalization she developed worsening anemia with heme positive stool.  Seen by GI.  The aspirin and Eliquis however continued Plavix because of recent stent placement.  She also developed A. fib with RVR.  Controlled with metoprolol. The urine output is recorded only 250 cc.  The kidney ultrasound ruled out obstruction.  Reportedly the bladder scan showed no urine in the bladder. Patient reported generalized weakness and low energy since December last year.  She has developed bilateral ankle edema with IV fluid.  Denies chest pain, shortness of breath, nausea, vomiting.  She does have external urinary catheter.  Creatinine, Ser  Date/Time Value Ref Range Status  02/17/2020 03:02 AM 3.23 (H) 0.44 - 1.00 mg/dL Final  02/16/2020 09:42 AM 2.96 (H) 0.44 - 1.00 mg/dL Final    Comment:    REPEATED TO VERIFY  02/15/2020 03:44 AM 1.95 (H) 0.44 - 1.00 mg/dL Final  02/14/2020 09:15 AM 1.59 (H) 0.44 - 1.00 mg/dL Final  02/13/2020 11:26 AM 1.29 (H) 0.44 - 1.00  mg/dL Final  02/12/2020 05:26 PM 1.66 (H) 0.44 - 1.00 mg/dL Final  01/23/2020 12:13 PM 1.88 (H) 0.44 - 1.00 mg/dL Final     PMHx:   Past Medical History:  Diagnosis Date   Bell's palsy    Breast cancer ([place]) 1998   Right mastectomy   CAD (coronary artery disease)    a. s/p STEMI in 01/2019 with DES to mid-LAD   CHF (congestive heart failure) ([place])    a. EF 30-35% by echo in 01/2019   Essential hypertension    GERD (gastroesophageal reflux disease)    Gout    History of skin cancer    Squamous cell, left shoulder   Mixed hyperlipidemia    Osteopenia    Thyroid nodule    Type 2 diabetes mellitus ([place])     Past Surgical History:  Procedure Laterality Date   BIOPSY  10/13/2019   Procedure: BIOPSY;  Surgeon: Daneil Dolin, MD;  [facility];  Service: Endoscopy;;   CATARACT EXTRACTION  2016   CORONARY ANGIOGRAPHY N/A 01/16/2020   Procedure: CORONARY ANGIOGRAPHY;  Surgeon: Jettie Booze, MD;  Location: [facility];  Service: Cardiovascular;  Laterality: N/A;   CORONARY STENT INTERVENTION N/A 01/16/2020   Procedure: CORONARY STENT INTERVENTION;  Surgeon: Jettie Booze, MD;  Location: [facility];  Service: Cardiovascular;  Laterality: N/A;   CORONARY/GRAFT ACUTE MI REVASCULARIZATION N/A 01/30/2019   Procedure: Coronary/Graft Acute MI Revascularization;  Surgeon: Burnell Blanks, MD;  Location: [facility]  INVASIVE [facility] LAB;  Service: Cardiovascular;  Laterality: N/A;   ESOPHAGOGASTRODUODENOSCOPY (EGD) WITH PROPOFOL N/A 10/13/2019   Non-obstructing Schatzki ring at GE junction, s/p dilation, erosive gastropathy with stigmata of recent bleeding, normal duodenum. Negative H.pylori.    HYSTEROSCOPY     INTRAVASCULAR ULTRASOUND/IVUS N/A 01/16/2020   Procedure: Intravascular Ultrasound/IVUS;  Surgeon: Jettie Booze, MD;  Location: [facility];  Service: Cardiovascular;  Laterality: N/A;   LEFT HEART CATH AND CORONARY  ANGIOGRAPHY N/A 01/30/2019   Procedure: LEFT HEART CATH AND CORONARY ANGIOGRAPHY;  Surgeon: Burnell Blanks, MD;  Location: [facility];  Service: Cardiovascular;  Laterality: N/A;   LEFT HEART CATH AND CORONARY ANGIOGRAPHY N/A 01/16/2020   Procedure: LEFT HEART CATH AND CORONARY ANGIOGRAPHY;  Surgeon: Jettie Booze, MD;  Location: [facility];  Service: Cardiovascular;  Laterality: N/A;   MALONEY DILATION N/A 10/13/2019   Procedure: Venia Minks DILATION;  Surgeon: Daneil Dolin, MD;  [facility];  Service: Endoscopy;  Laterality: N/A;   Right mastectomy  1998   [place]   RIGHT/LEFT HEART CATH AND CORONARY ANGIOGRAPHY N/A 02/13/2020   Procedure: RIGHT/LEFT HEART CATH AND CORONARY ANGIOGRAPHY;  Surgeon: Martinique, Peter M, MD;  Location: [facility];  Service: Cardiovascular;  Laterality: N/A;    Family Hx:  Family History  Problem Relation Age of Onset   Stroke Mother    Heart attack Father    Aneurysm Sister    Heart attack Maternal Uncle    Heart disease Maternal Uncle    Heart attack Paternal Aunt    Heart disease Paternal Aunt    Heart attack Paternal Grandfather    Heart disease Paternal Grandfather    Heart attack Paternal Aunt    Heart disease Paternal Aunt    Heart attack Maternal Uncle    Heart disease Maternal Uncle    Heart attack Maternal Uncle    Heart disease Maternal Uncle    Heart attack Sister    Heart disease Sister    Cancer Sister    Colon cancer Neg Hx    Colon polyps Neg Hx     Social History:  reports that she has never smoked. She has never used smokeless tobacco. She reports that she does not drink alcohol and does not use drugs.  Allergies:  Allergies  Allergen Reactions   Bactrim [Sulfamethoxazole-Trimethoprim] Nausea And Vomiting   Sulfa Antibiotics Nausea And Vomiting   Amlodipine Other (See Comments)    EDEMA   Clonidine Derivatives Other (See Comments)    FATIGUE    Evista [Raloxifene Hcl] Other (See Comments)    GERD   Fosamax [Alendronate Sodium] Other (See Comments)    INCREASED GERD    Glipizide Other (See Comments)    PALPITATIONS   Lipitor [Atorvastatin Calcium] Other (See Comments)    ACHING   Losartan Other (See Comments)    FATIGUE    Pravastatin Other (See Comments)    ACHING   Azithromycin Rash and Other (See Comments)    FACIAL BURNING     Medications: Prior to Admission medications   Medication Sig Start Date End Date Taking? Authorizing Provider  acetaminophen (TYLENOL) 500 MG tablet Take 500 mg by mouth every 6 (six) hours as needed for headache (pain).   Yes [provider]  Alogliptin Benzoate 12.5 MG TABS Take 12.5 mg by mouth daily at 2 PM.  06/29/19  Yes [provider]  aspirin EC 81 MG tablet Take 81 mg  by mouth every morning. (0930)   Yes [provider]  Calcium Carbonate-Vitamin D (CALCIUM 500 + D PO) Take 1 tablet by mouth daily before lunch. Citracal plus D3   Yes [provider]  carvedilol (COREG) 25 MG tablet Take 1 tablet (25 mg total) by mouth in the morning and at bedtime. Breakfast & supper 11/28/19  Yes Satira Sark, MD  cholecalciferol (VITAMIN D3) 25 MCG (1000 UT) tablet Take 1,000 Units by mouth daily with breakfast.   Yes [provider]  CINNAMON PO Take 1,000 mg by mouth daily before lunch.    Yes [provider]  clopidogrel (PLAVIX) 75 MG tablet TAKE 1 TABLET BY MOUTH DAILY--STOP BRILLINTA. Patient taking differently: Take 75 mg by mouth every morning. 10/05/19  Yes Satira Sark, MD  diazepam (VALIUM) 2 MG tablet Take 2 mg by mouth 2 (two) times daily as needed (dizzy spells.).   Yes [provider]  Doxepin HCl 3 MG TABS Take 3 mg by mouth at bedtime as needed (sleep.).  09/26/19  Yes [provider]  ferrous sulfate 325 (65 FE) MG tablet Take 325 mg by mouth daily before lunch.   Yes [provider]  furosemide  (LASIX) 20 MG tablet Take 1 tablet (20 mg total) by mouth daily. 03/10/19 10/05/20 Yes Strader, Fransisco Hertz, PA-C  hydrALAZINE (APRESOLINE) 50 MG tablet Take 0.5 tablets (25 mg total) by mouth in the morning and at bedtime. 02/10/20 05/10/20 Yes Satira Sark, MD  isosorbide mononitrate (IMDUR) 30 MG 24 hr tablet Take 1 tablet (30 mg total) by mouth daily. Patient taking differently: Take 30 mg by mouth at bedtime. 01/19/20  Yes Nahser, Wonda Cheng, MD  loperamide (IMODIUM) 2 MG capsule Take 2-4 mg by mouth 4 (four) times daily as needed for diarrhea or loose stools.   Yes [provider]  meclizine (ANTIVERT) 25 MG tablet Take 25 mg by mouth 2 (two) times daily as needed for dizziness.   Yes [provider]  Multiple Vitamin (MULTIVITAMIN WITH MINERALS) TABS tablet Take 1 tablet by mouth daily. Centrum Silver for Women   Yes [provider]  multivitamin-lutein (OCUVITE-LUTEIN) CAPS capsule Take 1 capsule by mouth daily before lunch.    Yes [provider]  nitroGLYCERIN (NITROSTAT) 0.4 MG SL tablet Place 1 tablet (0.4 mg total) under the tongue every 5 (five) minutes x 3 doses as needed for chest pain. 02/02/19  Yes Cheryln Manly, NP  Omega-3 Fatty Acids (FISH OIL PO) Take 1,576 mg by mouth daily before lunch.   Yes [provider]  pantoprazole (PROTONIX) 40 MG tablet Take 1 tablet (40 mg total) by mouth daily. Take 30 minutes before breakfast 01/12/20  Yes Annitta Needs, NP  Pitavastatin Calcium (LIVALO) 1 MG TABS Take 1 tablet (1 mg total) by mouth daily. 02/02/20  Yes Satira Sark, MD  saccharomyces boulardii (FLORASTOR) 250 MG capsule Take 250 mg by mouth 2 (two) times daily as needed (diarrhea).    Yes [provider]  sodium chloride (OCEAN) 0.65 % SOLN nasal spray Place 1 spray into both nostrils 2 (two) times daily as needed for congestion.    Yes [provider]    I have reviewed the patient's current medications.  Labs:   Results for orders placed or performed during the hospital encounter of 02/12/20 (from the past 48 hour(s))  Heparin level (unfractionated)     Status: Abnormal   Collection Time: 02/15/20  3:28  PM  Result Value Ref Range   Heparin Unfractionated 0.17 (L) 0.30 - 0.70 IU/mL    Comment: (NOTE) If heparin results are below expected values, and patient dosage has  been confirmed, suggest follow up testing of antithrombin III levels. Performed at [facility], [place] [address], [place], [place] [address]   CBC     Status: Abnormal   Collection Time: 02/15/20  3:28 PM  Result Value Ref Range   WBC 7.8 4.0 - 10.5 K/uL   RBC 3.13 (L) 3.87 - 5.11 MIL/uL   Hemoglobin 8.9 (L) 12.0 - 15.0 g/dL   HCT 26.2 (L) 36.0 - 46.0 %   MCV 83.7 80.0 - 100.0 fL   MCH 28.4 26.0 - 34.0 pg   MCHC 34.0 30.0 - 36.0 g/dL   RDW 15.1 11.5 - 15.5 %   Platelets 290 150 - 400 K/uL   nRBC 0.0 0.0 - 0.2 %    Comment: Performed at [facility], [place] [address], [place], [place] [address]  Heparin level (unfractionated)     Status: Abnormal   Collection Time: 02/16/20 12:10 AM  Result Value Ref Range   Heparin Unfractionated 0.10 (L) 0.30 - 0.70 IU/mL    Comment: (NOTE) If heparin results are below expected values, and patient dosage has  been confirmed, suggest follow up testing of antithrombin III levels. Performed at [facility], [place] [address], [place], [place] [address]   CBC     Status: Abnormal   Collection Time: 02/16/20  6:04 AM  Result Value Ref Range   WBC 9.4 4.0 - 10.5 K/uL   RBC 3.11 (L) 3.87 - 5.11 MIL/uL   Hemoglobin 8.8 (L) 12.0 - 15.0 g/dL   HCT 25.7 (L) 36.0 - 46.0 %   MCV 82.6 80.0 - 100.0 fL   MCH 28.3 26.0 - 34.0 pg   MCHC 34.2 30.0 - 36.0 g/dL   RDW 15.3 11.5 - 15.5 %   Platelets 285 150 - 400 K/uL   nRBC 0.0 0.0 - 0.2 %    Comment: Performed at [facility], [place] [address] [place], [place] [address]  Basic metabolic panel     Status: Abnormal   Collection  Time: 02/16/20  9:42 AM  Result Value Ref Range   Sodium 130 (L) 135 - 145 mmol/L   Potassium 4.9 3.5 - 5.1 mmol/L   Chloride 94 (L) 98 - 111 mmol/L   CO2 22 22 - 32 mmol/L   Glucose, Bld 182 (H) 70 - 99 mg/dL    Comment: Glucose reference range applies only to samples taken after fasting for at least 8 hours.   BUN 46 (H) 8 - 23 mg/dL   Creatinine, Ser 2.96 (H) 0.44 - 1.00 mg/dL    Comment: REPEATED TO VERIFY   Calcium 9.0 8.9 - 10.3 mg/dL   GFR, Estimated 15 (L) >60 mL/min    Comment: (NOTE) Calculated using the CKD-EPI Creatinine Equation (2021)    Anion gap 14 5 - 15    Comment: Performed at [facility] [address] [place] [address]  CBC     Status: Abnormal   Collection Time: 02/16/20  8:54 PM  Result Value Ref Range   WBC 9.4 4.0 - 10.5 K/uL   RBC 3.12 (L) 3.87 - 5.11 MIL/uL   Hemoglobin 8.4 (L) 12.0 - 15.0 g/dL   HCT 25.9 (L) 36.0 - 46.0 %   MCV 83.0 80.0 -  100.0 fL   MCH 26.9 26.0 - 34.0 pg   MCHC 32.4 30.0 - 36.0 g/dL   RDW 15.2 11.5 - 15.5 %   Platelets 257 150 - 400 K/uL   nRBC 0.0 0.0 - 0.2 %    Comment: Performed at [facility], [place] [address], [place], [place] [address]  Glucose, capillary     Status: Abnormal   Collection Time: 02/16/20  9:25 PM  Result Value Ref Range   Glucose-Capillary 144 (H) 70 - 99 mg/dL    Comment: Glucose reference range applies only to samples taken after fasting for at least 8 hours.  CBC     Status: Abnormal   Collection Time: 02/17/20  3:02 AM  Result Value Ref Range   WBC 8.2 4.0 - 10.5 K/uL   RBC 2.88 (L) 3.87 - 5.11 MIL/uL   Hemoglobin 8.2 (L) 12.0 - 15.0 g/dL   HCT 23.8 (L) 36.0 - 46.0 %   MCV 82.6 80.0 - 100.0 fL   MCH 28.5 26.0 - 34.0 pg   MCHC 34.5 30.0 - 36.0 g/dL   RDW 15.3 11.5 - 15.5 %   Platelets 233 150 - 400 K/uL   nRBC 0.0 0.0 - 0.2 %    Comment: Performed at [facility], [place] [address] [place], [place] [address]  Basic metabolic panel     Status: Abnormal   Collection  Time: 02/17/20  3:02 AM  Result Value Ref Range   Sodium 126 (L) 135 - 145 mmol/L   Potassium 4.9 3.5 - 5.1 mmol/L   Chloride 94 (L) 98 - 111 mmol/L   CO2 20 (L) 22 - 32 mmol/L   Glucose, Bld 112 (H) 70 - 99 mg/dL    Comment: Glucose reference range applies only to samples taken after fasting for at least 8 hours.   BUN 50 (H) 8 - 23 mg/dL   Creatinine, Ser 3.23 (H) 0.44 - 1.00 mg/dL   Calcium 8.4 (L) 8.9 - 10.3 mg/dL   GFR, Estimated 14 (L) >60 mL/min    Comment: (NOTE) Calculated using the CKD-EPI Creatinine Equation (2021)    Anion gap 12 5 - 15    Comment: Performed at [facility] [address], [place], [place] [address]     ROS:  Pertinent items noted in HPI and remainder of comprehensive ROS otherwise negative.  Physical Exam: Vitals:   02/17/20 0542 02/17/20 0954  BP: (!) 145/54 (!) 150/57  Pulse: 60 64  Resp: 15   Temp: 98.1 F (36.7 C)   SpO2: 98%      General exam: Appears calm and comfortable  Respiratory system: Basal rhonchi, no wheezing or increased work of breathing.  Cardiovascular system: S1 & S2 heard, RRR.  Bilateral ankle edema present Gastrointestinal system: Abdomen is nondistended, soft and nontender. Normal bowel sounds heard. Central nervous system: Alert and oriented. No focal neurological deficits. Extremities: Symmetric 5 x 5 power. Skin: No rashes, lesions or ulcers Psychiatry: Judgement and insight appear normal. Mood & affect appropriate.   Assessment/Plan:  #Acute kidney injury on CKD stage IIIb/IV: Likely due to contrast nephropathy in association with hemodynamic changes related with A. fib with RVR, CHF and Lasix use. I will check urinalysis Reportedly bladder scan with no urinary retention.  The kidney ultrasound ruled out obstruction. I will hold further Lasix or IV fluid today. Watch for renal recovery.  Avoid nephrotoxins or hypotensive episode. No need for dialysis.  #Acute CHF exacerbation:  She does have some  peripheral edema.  It was worsened after stopping Lasix and with IV fluid.  Leg elevation for now. Check albumin level.  Hold Lasix today however she will need oral diuretics when renal function is stabilized.  #Hyponatremia, hypervolemic: Worsened with IV fluid and after stopping loop diuretics.  Recommend fluid restriction.  I will check urine lites.  # NSTEMI: Status post cardiac cath with stent placement in RCA and LAD.  On Plavix.  No Eliquis or aspirin due to GI bleed.  #Acute on chronic combined CHF, recent EF 50-55%.  Repeating echo.  Initially treated with diabetes which is on hold because of AKI.  #Anemia with stool occult blood positive: May have some component of CKD.  Holding anticoagulation.  Treating with IV iron.  Monitor hemoglobin.  #A. fib with RVR: On amiodarone and metoprolol.  Monitor heart rate.  #Hypertension: Blood pressure acceptable.  Continue current cardiac medication.  Avoid hypotensive episode in this elderly female.  Thank you for the consult.  We will follow with you.  Temple Sporer Tanna Furry 02/17/2020, 2:26 PM  [facility].

## 2020-02-18 DIAGNOSIS — I5043 Acute on chronic combined systolic (congestive) and diastolic (congestive) heart failure: Secondary | ICD-10-CM | POA: Diagnosis not present

## 2020-02-18 DIAGNOSIS — E7800 Pure hypercholesterolemia, unspecified: Secondary | ICD-10-CM

## 2020-02-18 DIAGNOSIS — I480 Paroxysmal atrial fibrillation: Secondary | ICD-10-CM | POA: Diagnosis not present

## 2020-02-18 DIAGNOSIS — I2583 Coronary atherosclerosis due to lipid rich plaque: Secondary | ICD-10-CM

## 2020-02-18 DIAGNOSIS — I2510 Atherosclerotic heart disease of native coronary artery without angina pectoris: Secondary | ICD-10-CM | POA: Diagnosis not present

## 2020-02-18 DIAGNOSIS — I214 Non-ST elevation (NSTEMI) myocardial infarction: Secondary | ICD-10-CM | POA: Diagnosis not present

## 2020-02-18 LAB — BASIC METABOLIC PANEL
Anion gap: 12 (ref 5–15)
BUN: 53 mg/dL — ABNORMAL HIGH (ref 8–23)
CO2: 19 mmol/L — ABNORMAL LOW (ref 22–32)
Calcium: 8.4 mg/dL — ABNORMAL LOW (ref 8.9–10.3)
Chloride: 96 mmol/L — ABNORMAL LOW (ref 98–111)
Creatinine, Ser: 3.47 mg/dL — ABNORMAL HIGH (ref 0.44–1.00)
GFR, Estimated: 13 mL/min — ABNORMAL LOW (ref >60)
Glucose, Bld: 116 mg/dL — ABNORMAL HIGH (ref 70–99)
Potassium: 4.7 mmol/L (ref 3.5–5.1)
Sodium: 127 mmol/L — ABNORMAL LOW (ref 135–145)

## 2020-02-18 LAB — CBC
HCT: 24.4 % — ABNORMAL LOW (ref 36.0–46.0)
Hemoglobin: 8.4 g/dL — ABNORMAL LOW (ref 12.0–15.0)
MCH: 28.7 pg (ref 26.0–34.0)
MCHC: 34.4 g/dL (ref 30.0–36.0)
MCV: 83.3 fL (ref 80.0–100.0)
Platelets: 230 10*3/uL (ref 150–400)
RBC: 2.93 MIL/uL — ABNORMAL LOW (ref 3.87–5.11)
RDW: 15.7 % — ABNORMAL HIGH (ref 11.5–15.5)
WBC: 7.7 10*3/uL (ref 4.0–10.5)
nRBC: 0.3 % — ABNORMAL HIGH (ref 0.0–0.2)

## 2020-02-18 LAB — ALBUMIN: Albumin: 2.8 g/dL — ABNORMAL LOW (ref 3.5–5.0)

## 2020-02-18 LAB — GLUCOSE, CAPILLARY
Glucose-Capillary: 122 mg/dL — ABNORMAL HIGH (ref 70–99)
Glucose-Capillary: 99 mg/dL (ref 70–99)
Glucose-Capillary: 112 mg/dL — ABNORMAL HIGH (ref 70–99)

## 2020-02-18 LAB — URINALYSIS, ROUTINE W REFLEX MICROSCOPIC
Bilirubin Urine: NEGATIVE
Glucose, UA: NEGATIVE mg/dL
Hgb urine dipstick: NEGATIVE
Ketones, ur: NEGATIVE mg/dL
Nitrite: NEGATIVE
Protein, ur: 30 mg/dL — AB
Specific Gravity, Urine: 1.015 (ref 1.005–1.030)
pH: 5 (ref 5.0–8.0)

## 2020-02-18 LAB — BRAIN NATRIURETIC PEPTIDE: B Natriuretic Peptide: 2867.6 pg/mL — ABNORMAL HIGH (ref 0.0–100.0)

## 2020-02-18 MED ORDER — FUROSEMIDE 10 MG/ML IJ SOLN
60.0000 mg | Freq: Once | INTRAMUSCULAR | Status: AC
  Administered 2020-02-18: 60 mg via INTRAVENOUS
  Filled 2020-02-18: qty 6

## 2020-02-18 MED ORDER — ALBUMIN HUMAN 25 % IV SOLN
50.0000 g | Freq: Once | INTRAVENOUS | Status: AC
  Administered 2020-02-18: 50 g via INTRAVENOUS
  Filled 2020-02-18: qty 200

## 2020-02-18 NOTE — Progress Notes (Signed)
[facility] NEPHROLOGY PROGRESS NOTE  Assessment/ Plan: Pt is a 86 y.o. yo female  with history of hypertension, HLD, CKD 3 with baseline creatinine level around 1.3-1.9, chronic combined CHF, CAD who was admitted on 1/17 for acute CHF exacerbation and an NSTEMI, seen as a consultation for acute kidney injury on CKD.  #Acute kidney injury on CKD stage IIIb/IV: Likely due to contrast nephropathy in association with hemodynamic changes related with A. fib with RVR, CHF/cardiorenal syndrome. UA pending. Reportedly bladder scan with no urinary retention.  The kidney ultrasound ruled out obstruction. Creatinine level trending up today and worsening lower extremity edema and shortness of breath.  I will order a dose of IV albumin and Lasix 60 mg IV. Watch for renal recovery.  Avoid nephrotoxins or hypotensive episode. No need for dialysis.  #Acute CHF exacerbation: Volume up today therefore ordering IV Lasix.  Cardiology is following  #Hyponatremia, hypervolemic: Worsened with IV fluid and after stopping loop diuretics.  Recommend fluid restriction.  Diuretics as above.  # NSTEMI: Status post cardiac cath with stent placement in RCA and LAD.  On Plavix.  No Eliquis or aspirin due to GI bleed.  #Acute on chronic combined CHF, recent EF 50-55%.  Repeating echo with worsening systolic function.  #Anemia with stool occult blood positive: May have some component of CKD.  Holding anticoagulation.  Treating with IV iron.  Monitor hemoglobin.  #A. fib with RVR: On amiodarone and metoprolol.  Monitor heart rate.  #Hypertension: Blood pressure acceptable.  Continue current cardiac medication.  Avoid hypotensive episode in this elderly female.  Subjective: Seen and examined.  No urine output recorded.  Patient reports worsening shortness of breath and concern about lower extremity edema.  No nausea, vomiting, chest pain. Objective Vital signs in last 24 hours: Vitals:   02/17/20 2125  02/18/20 0535 02/18/20 0752 02/18/20 1110  BP: (!) 165/61 (!) 150/55 (!) 165/63 (!) 147/56  Pulse: 66 (!) 58 (!) 59 (!) 57  Resp: 16 18 (!) 23 (!) 22  Temp: 98.4 F (36.9 C) 99 F (37.2 C) 97.8 F (36.6 C) 97.8 F (36.6 C)  TempSrc: Oral Oral Oral Oral  SpO2: 97% 97% 97% 96%  Weight:      Height:       Weight change:   Intake/Output Summary (Last 24 hours) at 02/18/2020 1257 Last data filed at 02/18/2020 3893 Gross per 24 hour  Intake 240 ml  Output --  Net 240 ml       Labs: Basic Metabolic Panel: Recent Labs  Lab 02/16/20 0942 02/17/20 0302 02/18/20 0225  NA 130* 126* 127*  K 4.9 4.9 4.7  CL 94* 94* 96*  CO2 22 20* 19*  GLUCOSE 182* 112* 116*  BUN 46* 50* 53*  CREATININE 2.96* 3.23* 3.47*  CALCIUM 9.0 8.4* 8.4*   Liver Function Tests: Recent Labs  Lab 02/18/20 0225  ALBUMIN 2.8*   No results for input(s): LIPASE, AMYLASE in the last 168 hours. No results for input(s): AMMONIA in the last 168 hours. CBC: Recent Labs  Lab 02/15/20 1528 02/16/20 0604 02/16/20 2054 02/17/20 0302 02/18/20 0225  WBC 7.8 9.4 9.4 8.2 7.7  HGB 8.9* 8.8* 8.4* 8.2* 8.4*  HCT 26.2* 25.7* 25.9* 23.8* 24.4*  MCV 83.7 82.6 83.0 82.6 83.3  PLT 290 285 257 233 230   Cardiac Enzymes: No results for input(s): CKTOTAL, CKMB, CKMBINDEX, TROPONINI in the last 168 hours. CBG: Recent Labs  Lab 02/16/20 2125 02/17/20 2137 02/18/20 1113  GLUCAP 144* 139* 122*    Iron Studies: No results for input(s): IRON, TIBC, TRANSFERRIN, FERRITIN in the last 72 hours. Studies/Results: DG Chest 2 View  Result Date: 02/17/2020 CLINICAL DATA:  Shortness of breath. EXAM: CHEST - 2 VIEW COMPARISON:  February 12, 2020. FINDINGS: Stable cardiomediastinal silhouette. No pneumothorax is noted. Small bilateral pleural effusions are noted. Mild left basilar subsegmental atelectasis or infiltrate may be present. Bony thorax is unremarkable. IMPRESSION: Small bilateral pleural effusions. Mild left basilar  subsegmental atelectasis or infiltrate may be present. Aortic Atherosclerosis (ICD10-I70.0). Electronically Signed   By: Marijo Conception M.D.   On: 02/17/2020 10:33   US RENAL  Result Date: 02/17/2020 CLINICAL DATA:  Acute on chronic kidney injury EXAM: RENAL / URINARY TRACT ULTRASOUND COMPLETE COMPARISON:  CT abdomen and pelvis 04/16/2019 FINDINGS: Right Kidney: Renal measurements: 10.3 x 4.3 x 4.4 = volume: 103 mL. Normal echogenicity. No hydronephrosis. 2.5 cm simple cyst seen in the mid kidney. Left Kidney: Renal measurements: 9.9 x 5.3 x 4.2 = volume: 117 mL. Normal echogenicity. No hydronephrosis. Prominence of the cortex in the mid left kidney is not significantly changed compared to prior examination and most likely represents normal anatomic variation given that hilar fat also changes contours in this region. Bladder: Position is limited due to shadowing bowel gas. Other: None. IMPRESSION: No significant abnormality of the kidneys. Electronically Signed   By: Miachel Roux M.D.   On: 02/17/2020 14:00   ECHOCARDIOGRAM LIMITED  Result Date: 02/17/2020    ECHOCARDIOGRAM LIMITED REPORT   Patient Name:   Crystal [name] Date of Exam: 02/17/2020 Medical Rec #:  [id number]          Height:       65.0 in Accession #:    [id number]         Weight:       147.5 lb Date of Birth:  [birth year]          BSA:          1.738 m Patient Age:    33 years           BP:           150/57 mmHg Patient Gender: F                  HR:           63 bpm. Exam Location:  Inpatient Procedure: Limited Echo, Limited Color Doppler and Cardiac Doppler Indications:    NSTEMI  History:        Patient has prior history of Echocardiogram examinations, most                 recent 01/17/2020. CAD; Risk Factors:Dyslipidemia and Diabetes.  Sonographer:    Johny Chess Referring Phys: [address] [place]  1. Compared with the echo 81/2751, systolic function is worse and the pleural effusion is new.  2. Hypokinesis of the basal  to mid anteroseptal, inferoseptal and inferior myocardium. Left ventricular ejection fraction, by estimation, is 40 to 45%. The left ventricle has mildly decreased function. The left ventricle demonstrates regional wall motion abnormalities (see scoring diagram/findings for description). Left ventricular diastolic parameters are consistent with Grade II diastolic dysfunction (pseudonormalization). Elevated left ventricular end-diastolic pressure.  3. Moderate pleural effusion in the left lateral region.  4. Mild to moderate mitral valve regurgitation.  5. Tricuspid valve regurgitation is moderate.  6. The aortic valve is tricuspid. Aortic valve regurgitation is mild.  7.  There is moderately elevated pulmonary artery systolic pressure.  8. The inferior vena cava is dilated in size with <50% respiratory variability, suggesting right atrial pressure of 15 mmHg. FINDINGS  Left Ventricle: Hypokinesis of the basal to mid anteroseptal, inferoseptal and inferior myocardium. Left ventricular ejection fraction, by estimation, is 40 to 45%. The left ventricle has mildly decreased function. The left ventricle demonstrates regional wall motion abnormalities. Left ventricular diastolic parameters are consistent with Grade II diastolic dysfunction (pseudonormalization). Elevated left ventricular end-diastolic pressure. Right Ventricle: There is moderately elevated pulmonary artery systolic pressure. The tricuspid regurgitant velocity is 2.86 m/s, and with an assumed right atrial pressure of 15 mmHg, the estimated right ventricular systolic pressure is 19.3 mmHg. Pericardium: Trivial pericardial effusion is present. Mitral Valve: Mild mitral annular calcification. Mild to moderate mitral valve regurgitation. Tricuspid Valve: Tricuspid valve regurgitation is moderate. Aortic Valve: The aortic valve is tricuspid. Aortic valve regurgitation is mild. Pulmonic Valve: Pulmonic valve regurgitation is trivial. Venous: The inferior vena  cava is dilated in size with less than 50% respiratory variability, suggesting right atrial pressure of 15 mmHg. Additional Comments: There is a moderate pleural effusion in the left lateral region. LEFT VENTRICLE PLAX 2D LVIDd:         4.60 cm  Diastology LVIDs:         3.50 cm  LV e' medial:    4.35 cm/s LV PW:         1.20 cm  LV E/e' medial:  33.1 LV IVS:        1.00 cm  LV e' lateral:   6.85 cm/s LVOT diam:     1.70 cm  LV E/e' lateral: 21.0 LV SV:         39 LV SV Index:   22 LVOT Area:     2.27 cm  IVC IVC diam: 2.60 cm LEFT ATRIUM         Index LA diam:    3.60 cm 2.07 cm/m  AORTIC VALVE LVOT Vmax:   87.50 cm/s LVOT Vmean:  57.700 cm/s LVOT VTI:    0.170 m  AORTA Ao Root diam: 2.70 cm Ao Asc diam:  2.60 cm MITRAL VALVE                TRICUSPID VALVE MV Area (PHT): 2.20 cm     TR Peak grad:   32.7 mmHg MV Decel Time: 345 msec     TR Vmax:        286.00 cm/s MV E velocity: 144.00 cm/s MV A velocity: 98.30 cm/s   SHUNTS MV E/A ratio:  1.46         Systemic VTI:  0.17 m                             Systemic Diam: 1.70 cm Skeet Latch MD Electronically signed by Skeet Latch MD Signature Date/Time: 02/17/2020/3:43:24 PM    Final     Medications: Infusions: . sodium chloride    . albumin human      Scheduled Medications: . amiodarone  200 mg Oral BID  . calcium-vitamin D  1 tablet Oral QAC lunch  . cholecalciferol  1,000 Units Oral Q breakfast  . clopidogrel  75 mg Oral q morning - 10a  . ferrous sulfate  325 mg Oral QAC lunch  . furosemide  60 mg Intravenous Once  . hydrALAZINE  25 mg Oral Q8H  . isosorbide mononitrate  30  mg Oral QHS  . metoprolol tartrate  25 mg Oral BID  . pantoprazole  40 mg Oral Daily  . polycarbophil  625 mg Oral Daily  . rosuvastatin  2.5 mg Oral Q48H  . saccharomyces boulardii  250 mg Oral BID  . sodium chloride flush  3 mL Intravenous Q12H    have reviewed scheduled and prn medications.  Physical Exam: General:NAD, comfortable Heart:RRR, s1s2  nl Lungs: Basal rhonchi, no increased work of breathing Abdomen:soft, Non-tender, non-distended Extremities: Lower extremity edema present Neurology: Alert awake and following commands, no tremor  Khary Schaben [employer] 02/18/2020,12:57 PM  LOS: 5 days

## 2020-02-18 NOTE — Progress Notes (Signed)
Progress Note  Patient Name: Crystal [name] Date of Encounter: 02/18/2020  Primary Cardiologist: Rozann Lesches, MD  Subjective   Continues to have nausea.  Complains that legs are starting to swell.  NO chest pain but still SOB.   Inpatient Medications    Scheduled Meds: . amiodarone  200 mg Oral BID  . calcium-vitamin D  1 tablet Oral QAC lunch  . cholecalciferol  1,000 Units Oral Q breakfast  . clopidogrel  75 mg Oral q morning - 10a  . ferrous sulfate  325 mg Oral QAC lunch  . hydrALAZINE  25 mg Oral Q8H  . isosorbide mononitrate  30 mg Oral QHS  . metoprolol tartrate  25 mg Oral BID  . pantoprazole  40 mg Oral Daily  . polycarbophil  625 mg Oral Daily  . rosuvastatin  2.5 mg Oral Q48H  . saccharomyces boulardii  250 mg Oral BID  . sodium chloride flush  3 mL Intravenous Q12H  . sodium chloride flush  3 mL Intravenous Q12H   Continuous Infusions: . sodium chloride    . sodium chloride    . ferumoxytol     PRN Meds: sodium chloride, sodium chloride, acetaminophen, alum & mag hydroxide-simeth, bisacodyl, magnesium hydroxide, nitroGLYCERIN, ondansetron (ZOFRAN) IV, sodium chloride flush, sodium chloride flush   Vital Signs    Vitals:   02/17/20 1428 02/17/20 2125 02/18/20 0535 02/18/20 0752  BP: (!) 147/61 (!) 165/61 (!) 150/55 (!) 165/63  Pulse:  66 (!) 58 (!) 59  Resp:  16 18 (!) 23  Temp:  98.4 F (36.9 C) 99 F (37.2 C) 97.8 F (36.6 C)  TempSrc:  Oral Oral Oral  SpO2:  97% 97% 97%  Weight:      Height:       No intake or output data in the 24 hours ending 02/18/20 0936 Last 3 Weights 02/17/2020 02/16/2020 02/14/2020  Weight (lbs) 147 lb 7.8 oz 147 lb 6.4 oz 172 lb 1.6 oz  Weight (kg) 66.9 kg 66.86 kg 78.064 kg     Telemetry    NSR - Personally Reviewed  Physical Exam   GEN: Well nourished, well developed in no acute distress HEENT: Normal NECK: No JVD; No carotid bruits LYMPHATICS: No lymphadenopathy CARDIAC:RRR, no murmurs, rubs,  gallops RESPIRATORY:  Clear to auscultation without rales, wheezing or rhonchi  ABDOMEN: Soft, non-tender, non-distended MUSCULOSKELETAL:  1+ BLE edema; No deformity  SKIN: Warm and dry NEUROLOGIC:  Alert and oriented x 3 PSYCHIATRIC:  Normal affect    Labs    High Sensitivity Troponin:   Recent Labs  Lab 02/12/20 1726 02/12/20 2300 02/13/20 1126 02/13/20 1330  TROPONINIHS 254* 1,735* 1,229* 1,055*      Cardiac EnzymesNo results for input(s): TROPONINI in the last 168 hours. No results for input(s): TROPIPOC in the last 168 hours.   Chemistry Recent Labs  Lab 02/16/20 0942 02/17/20 0302 02/18/20 0225  NA 130* 126* 127*  K 4.9 4.9 4.7  CL 94* 94* 96*  CO2 22 20* 19*  GLUCOSE 182* 112* 116*  BUN 46* 50* 53*  CREATININE 2.96* 3.23* 3.47*  CALCIUM 9.0 8.4* 8.4*  ALBUMIN  --   --  2.8*  GFRNONAA 15* 14* 13*  ANIONGAP 14 12 12      Hematology Recent Labs  Lab 02/16/20 2054 02/17/20 0302 02/18/20 0225  WBC 9.4 8.2 7.7  RBC 3.12* 2.88* 2.93*  HGB 8.4* 8.2* 8.4*  HCT 25.9* 23.8* 24.4*  MCV 83.0 82.6 83.3  MCH  26.9 28.5 28.7  MCHC 32.4 34.5 34.4  RDW 15.2 15.3 15.7*  PLT 257 233 230    BNP Recent Labs  Lab 02/13/20 0355 02/18/20 0225  BNP 4,423.6* 2,867.6*     DDimer No results for input(s): DDIMER in the last 168 hours.   Radiology    DG Chest 2 View  Result Date: 02/17/2020 CLINICAL DATA:  Shortness of breath. EXAM: CHEST - 2 VIEW COMPARISON:  February 12, 2020. FINDINGS: Stable cardiomediastinal silhouette. No pneumothorax is noted. Small bilateral pleural effusions are noted. Mild left basilar subsegmental atelectasis or infiltrate may be present. Bony thorax is unremarkable. IMPRESSION: Small bilateral pleural effusions. Mild left basilar subsegmental atelectasis or infiltrate may be present. Aortic Atherosclerosis (ICD10-I70.0). Electronically Signed   By: Marijo Conception M.D.   On: 02/17/2020 10:33   US RENAL  Result Date: 02/17/2020 CLINICAL  DATA:  Acute on chronic kidney injury EXAM: RENAL / URINARY TRACT ULTRASOUND COMPLETE COMPARISON:  CT abdomen and pelvis 04/16/2019 FINDINGS: Right Kidney: Renal measurements: 10.3 x 4.3 x 4.4 = volume: 103 mL. Normal echogenicity. No hydronephrosis. 2.5 cm simple cyst seen in the mid kidney. Left Kidney: Renal measurements: 9.9 x 5.3 x 4.2 = volume: 117 mL. Normal echogenicity. No hydronephrosis. Prominence of the cortex in the mid left kidney is not significantly changed compared to prior examination and most likely represents normal anatomic variation given that hilar fat also changes contours in this region. Bladder: Position is limited due to shadowing bowel gas. Other: None. IMPRESSION: No significant abnormality of the kidneys. Electronically Signed   By: Miachel Roux M.D.   On: 02/17/2020 14:00   ECHOCARDIOGRAM LIMITED  Result Date: 02/17/2020    ECHOCARDIOGRAM LIMITED REPORT   Patient Name:   Crystal [name] Date of Exam: 02/17/2020 Medical Rec #:  [id number]          Height:       65.0 in Accession #:    [id number]         Weight:       147.5 lb Date of Birth:  [birth year]          BSA:          1.738 m Patient Age:    86 years           BP:           150/57 mmHg Patient Gender: F                  HR:           63 bpm. Exam Location:  Inpatient Procedure: Limited Echo, Limited Color Doppler and Cardiac Doppler Indications:    NSTEMI  History:        Patient has prior history of Echocardiogram examinations, most                 recent 01/17/2020. CAD; Risk Factors:Dyslipidemia and Diabetes.  Sonographer:    Johny Chess Referring Phys: 35 [place]  1. Compared with the echo 67/3419, systolic function is worse and the pleural effusion is new.  2. Hypokinesis of the basal to mid anteroseptal, inferoseptal and inferior myocardium. Left ventricular ejection fraction, by estimation, is 40 to 45%. The left ventricle has mildly decreased function. The left ventricle demonstrates regional  wall motion abnormalities (see scoring diagram/findings for description). Left ventricular diastolic parameters are consistent with Grade II diastolic dysfunction (pseudonormalization). Elevated left ventricular end-diastolic pressure.  3. Moderate pleural effusion in the  left lateral region.  4. Mild to moderate mitral valve regurgitation.  5. Tricuspid valve regurgitation is moderate.  6. The aortic valve is tricuspid. Aortic valve regurgitation is mild.  7. There is moderately elevated pulmonary artery systolic pressure.  8. The inferior vena cava is dilated in size with <50% respiratory variability, suggesting right atrial pressure of 15 mmHg. FINDINGS  Left Ventricle: Hypokinesis of the basal to mid anteroseptal, inferoseptal and inferior myocardium. Left ventricular ejection fraction, by estimation, is 40 to 45%. The left ventricle has mildly decreased function. The left ventricle demonstrates regional wall motion abnormalities. Left ventricular diastolic parameters are consistent with Grade II diastolic dysfunction (pseudonormalization). Elevated left ventricular end-diastolic pressure. Right Ventricle: There is moderately elevated pulmonary artery systolic pressure. The tricuspid regurgitant velocity is 2.86 m/s, and with an assumed right atrial pressure of 15 mmHg, the estimated right ventricular systolic pressure is 81.4 mmHg. Pericardium: Trivial pericardial effusion is present. Mitral Valve: Mild mitral annular calcification. Mild to moderate mitral valve regurgitation. Tricuspid Valve: Tricuspid valve regurgitation is moderate. Aortic Valve: The aortic valve is tricuspid. Aortic valve regurgitation is mild. Pulmonic Valve: Pulmonic valve regurgitation is trivial. Venous: The inferior vena cava is dilated in size with less than 50% respiratory variability, suggesting right atrial pressure of 15 mmHg. Additional Comments: There is a moderate pleural effusion in the left lateral region. LEFT VENTRICLE PLAX  2D LVIDd:         4.60 cm  Diastology LVIDs:         3.50 cm  LV e' medial:    4.35 cm/s LV PW:         1.20 cm  LV E/e' medial:  33.1 LV IVS:        1.00 cm  LV e' lateral:   6.85 cm/s LVOT diam:     1.70 cm  LV E/e' lateral: 21.0 LV SV:         39 LV SV Index:   22 LVOT Area:     2.27 cm  IVC IVC diam: 2.60 cm LEFT ATRIUM         Index LA diam:    3.60 cm 2.07 cm/m  AORTIC VALVE LVOT Vmax:   87.50 cm/s LVOT Vmean:  57.700 cm/s LVOT VTI:    0.170 m  AORTA Ao Root diam: 2.70 cm Ao Asc diam:  2.60 cm MITRAL VALVE                TRICUSPID VALVE MV Area (PHT): 2.20 cm     TR Peak grad:   32.7 mmHg MV Decel Time: 345 msec     TR Vmax:        286.00 cm/s MV E velocity: 144.00 cm/s MV A velocity: 98.30 cm/s   SHUNTS MV E/A ratio:  1.46         Systemic VTI:  0.17 m                             Systemic Diam: 1.70 cm Skeet Latch MD Electronically signed by Skeet Latch MD Signature Date/Time: 02/17/2020/3:43:24 PM    Final     Cardiac Studies   Cardiac Cath 02/13/20   Non-stenotic Mid LAD lesion was previously treated.  Prox Cx to Mid Cx lesion is 20% stenosed.  Dist RCA lesion is 20% stenosed.  Non-stenotic Mid RCA lesion was previously treated.  Hemodynamic findings consistent with mild pulmonary hypertension.  LV end diastolic pressure is  mildly elevated.   1. Nonobstructive CAD. Continued excellent patency of stents in the LAD and RCA 2. Mildly elevated LV filling pressures 3. Mildly elevated right heart pressures. 4. Normal cardiac output  Plan: continue medical therapy. Diuresis. BP control.  2D Echo 01/17/20  1. Left ventricular ejection fraction, by estimation, is 50 to 55%. The  left ventricle has low normal function. The left ventricle demonstrates  regional wall motion abnormalities (see scoring diagram/findings for  description). There is moderate left  ventricular hypertrophy. Left ventricular diastolic parameters are  consistent with Grade I diastolic dysfunction  (impaired relaxation).  Elevated left ventricular end-diastolic pressure. The E/e' is 85. There is  moderate hypokinesis of the left  ventricular, basal-mid inferior wall.  2. Right ventricular systolic function is hyperdynamic. The right  ventricular size is normal. There is normal pulmonary artery systolic  pressure.  3. Left atrial size was moderately dilated.  4. The mitral valve is abnormal. Trivial mitral valve regurgitation.  5. The aortic valve is tricuspid. Aortic valve regurgitation is not  visualized.  6. The inferior vena cava is normal in size with <50% respiratory  variability, suggesting right atrial pressure of 8 mmHg.   Comparison(s): No significant change from prior study. 01/16/20: LVEF  50-55%.   Patient Profile     86 y.o. female with CAD s/p DES to mLAD 01/2019 and recent inferior STEMI s/p DES to [facility] 12/2019, prior ICM with improved LVEF (50-55% EF by echo 12/2019), chronic combined CHF, HLD (statin intolerance), HLD and CKD III presented with few weeks hx of DOE, found to have NSTEMI with acut eon chronic systolic CHF. Hospital course complicated by AKI on CKD, iron deficiency anemia with heme positive stools, and new onset atrial fib RVR.  Assessment & Plan    1. Shortness of breath - presented with findings suspicious for NSTEMI - hsTn peaked at 1735.  - Underwent cardiac cath noted above with patent stents in the RCA and LAD. - - - Recommendation to continue medical therapy on ASA and Plavix.  - Aspirin was stopped initially due to need for addition of Eliquis but that is also now being stopped due to declining hemoglobin and concerns over GI bleeding. - - continue Plavix for now -continueBB, Imdur, and low dose statin (statin intolerant in the past)  2. Acute on chronic combined CHF - last EF assessed 01/17/20 at 50-55%, but this was prior to this admission - repeat limited echo 02/17/2020 showed a decline in EF at 40-45% with HK of the basal to mid  AS/IS and inferior myocardium and G2DD with elevated LV filling pressures - BNP >4000 with elevated filling pressures on cath. - Given IV lasix with little diuresis, and rise in Cr -> subsequently treated with IV fluids but now with increased LE edema and BNP 2867 (had been 4423 a week ago) - decreased BS noted at R lung base and Cxray yesterday with small B/L pleural effusions and atelectasis - I&O's are incomplete - weight not done today - SCr continues to increase >> ? Cardiorenal syndrome in addition to nephrotoxicity from IV contrast dye as cath was on 1/17 and SCr has been trending upward since then - she is volume overloaded on exam  - O2 sats 97% on RA - nephrology consulted and feel likely combination of cardiorenal syndrome from afib with RVR/acute CHF and diuretics as well as contrast nephropathy from recent cath -IVF on hold -they recommend no further diuretics for now until renal function improves and  then start oral diuretics -fluid restrict given low Na at 126 yesterday and now improved to 127 today -place TED hose stockings to help with LE edema -increase nutritional suppl due to low albumin that is likely contributing to LE edema as well -no ACE/ARB/ARNi/spiro due to AKI on CKD -continue Hydralazine 25mg  TID, Lopressor 25mg  BID and Imdur 30mg  daily  3. AKI on CKD III with worsening hyponatremia - baseline Cr quite variable recently anywhere from 1.3-1.9, admitted at 1.66 -> continues to rise, 2.96 and now 3.47 today - IVF stopped given increasing edema and decreasing sodium level - no obstruction on renal US - bladder scans with minimal residual per nurse - nephrology following and feels patient has contrast nephropathy with cardiorenal syndrome from afib with RVR, CHF and diuretic use - diuretics on hold and continue fluid restriction for low Na - start PO diuretics once renal function improves  3.IDAnemia/ + Occult stools:10.2>>8.5>>7.9 - basliene hemoglobin  appeared around 9-10 recently, nadir 7.9 this admission, with hemoglobin in the low-mid 8 range now - s/p IV iron x1, occult stool positive  - GI consulted with recommendations for conservative management - they were aware of dark stools/+FOBT but felt anemia was largely due to her chronic kidney disease - continue PPI - see below regarding Eliquis - continue daily CBC and appreciate nephro input on optimization as well  4. New onset Afib RVR:  - briefly noted this admission on 02/15/20, converted to NSR quickly after use of IV amiodarone - continue amiodarone to 200mg  BID given potential GI side effects of nausea/dry heaving>> she has some mild nausea today - Eliquis stopped given anemia/GI bleeding issues, worsening renal function and very transient nature of arrhythmia - can revisit if this recurs  5. HTN - managed in context of the above -continue Hydralazine, Imdur and BB -Bp borderline elevated but will not make any changes today to avoid acute drop in Bp and reduction in renal perfusion which could worsen renal function - patient unhappy with hydralazine but alternatives otherwise limited at this time given her renal issues  6. HLD - prior statin intolerance - pitavastatin and PCSK9 too expensive, agreeable to Crestor 2.5mg  every other day - lipid profile this admission with LDL 83, trig 74, HDL 56  I have spent a total of 40 minutes with patient reviewing 2D echo , telemetry, EKGs, labs and examining patient as well as establishing an assessment and plan that was discussed with the patient.  > 50% of time was spent in direct patient care.    For questions or updates, please contact [facility] Please consult [email or web address] for contact info under Cardiology/STEMI.  Signed, Fransico Him, MD 02/18/2020, 9:36 AM

## 2020-02-19 DIAGNOSIS — I480 Paroxysmal atrial fibrillation: Secondary | ICD-10-CM | POA: Diagnosis not present

## 2020-02-19 DIAGNOSIS — I2510 Atherosclerotic heart disease of native coronary artery without angina pectoris: Secondary | ICD-10-CM | POA: Diagnosis not present

## 2020-02-19 DIAGNOSIS — I130 Hypertensive heart and chronic kidney disease with heart failure and stage 1 through stage 4 chronic kidney disease, or unspecified chronic kidney disease: Secondary | ICD-10-CM | POA: Diagnosis not present

## 2020-02-19 DIAGNOSIS — I248 Other forms of acute ischemic heart disease: Secondary | ICD-10-CM | POA: Diagnosis not present

## 2020-02-19 DIAGNOSIS — I5043 Acute on chronic combined systolic (congestive) and diastolic (congestive) heart failure: Secondary | ICD-10-CM | POA: Diagnosis not present

## 2020-02-19 DIAGNOSIS — I214 Non-ST elevation (NSTEMI) myocardial infarction: Secondary | ICD-10-CM | POA: Diagnosis not present

## 2020-02-19 DIAGNOSIS — J9601 Acute respiratory failure with hypoxia: Secondary | ICD-10-CM | POA: Diagnosis not present

## 2020-02-19 LAB — CBC
HCT: 26.1 % — ABNORMAL LOW (ref 36.0–46.0)
Hemoglobin: 8.5 g/dL — ABNORMAL LOW (ref 12.0–15.0)
MCH: 27.5 pg (ref 26.0–34.0)
MCHC: 32.6 g/dL (ref 30.0–36.0)
MCV: 84.5 fL (ref 80.0–100.0)
Platelets: 217 10*3/uL (ref 150–400)
RBC: 3.09 MIL/uL — ABNORMAL LOW (ref 3.87–5.11)
RDW: 15.8 % — ABNORMAL HIGH (ref 11.5–15.5)
WBC: 7.9 10*3/uL (ref 4.0–10.5)
nRBC: 0 % (ref 0.0–0.2)

## 2020-02-19 LAB — GLUCOSE, CAPILLARY
Glucose-Capillary: 109 mg/dL — ABNORMAL HIGH (ref 70–99)
Glucose-Capillary: 131 mg/dL — ABNORMAL HIGH (ref 70–99)
Glucose-Capillary: 112 mg/dL — ABNORMAL HIGH (ref 70–99)
Glucose-Capillary: 92 mg/dL (ref 70–99)
Glucose-Capillary: 125 mg/dL — ABNORMAL HIGH (ref 70–99)

## 2020-02-19 LAB — BASIC METABOLIC PANEL
Anion gap: 13 (ref 5–15)
BUN: 56 mg/dL — ABNORMAL HIGH (ref 8–23)
CO2: 18 mmol/L — ABNORMAL LOW (ref 22–32)
Calcium: 8.7 mg/dL — ABNORMAL LOW (ref 8.9–10.3)
Chloride: 96 mmol/L — ABNORMAL LOW (ref 98–111)
Creatinine, Ser: 3.29 mg/dL — ABNORMAL HIGH (ref 0.44–1.00)
GFR, Estimated: 13 mL/min — ABNORMAL LOW (ref >60)
Glucose, Bld: 98 mg/dL (ref 70–99)
Potassium: 4.3 mmol/L (ref 3.5–5.1)
Sodium: 127 mmol/L — ABNORMAL LOW (ref 135–145)

## 2020-02-19 LAB — ALBUMIN: Albumin: 3.6 g/dL (ref 3.5–5.0)

## 2020-02-19 MED ORDER — APIXABAN 2.5 MG PO TABS
2.5000 mg | ORAL_TABLET | Freq: Two times a day (BID) | ORAL | Status: DC
  Administered 2020-02-19 – 2020-03-04 (×28): 2.5 mg via ORAL
  Filled 2020-02-19 (×28): qty 1

## 2020-02-19 MED ORDER — HYDRALAZINE HCL 50 MG PO TABS
50.0000 mg | ORAL_TABLET | Freq: Three times a day (TID) | ORAL | Status: DC
  Administered 2020-02-19 – 2020-02-22 (×9): 50 mg via ORAL
  Filled 2020-02-19 (×9): qty 1

## 2020-02-19 MED ORDER — FUROSEMIDE 10 MG/ML IJ SOLN
60.0000 mg | Freq: Once | INTRAMUSCULAR | Status: AC
  Administered 2020-02-19: 60 mg via INTRAVENOUS
  Filled 2020-02-19: qty 6

## 2020-02-19 MED ORDER — AMIODARONE IV BOLUS ONLY 150 MG/100ML
150.0000 mg | INTRAVENOUS | Status: AC
  Administered 2020-02-19: 150 mg via INTRAVENOUS
  Filled 2020-02-19: qty 100

## 2020-02-19 NOTE — Progress Notes (Signed)
   02/19/20 1900  Assess: MEWS Score  Temp 98.2 F (36.8 C)  BP (!) 152/95  Pulse Rate (!) 112  ECG Heart Rate (!) 117  Resp (!) 22  SpO2 97 %  O2 Device Room Air  O2 Flow Rate (L/min) 2 L/min  Assess: MEWS Score  MEWS Temp 0  MEWS Systolic 0  MEWS Pulse 2  MEWS RR 1  MEWS LOC 0  MEWS Score 3  MEWS Score Color Yellow  Assess: if the MEWS score is Yellow or Red  Were vital signs taken at a resting state? Yes  Focused Assessment Change from prior assessment (see assessment flowsheet)  Early Detection of Sepsis Score *See Row Information* Medium  MEWS guidelines implemented *See Row Information* Yes  Treat  MEWS Interventions Other (Comment) (notified MD patient now in Afib)  Pain Scale 0-10  Pain Score 0  Take Vital Signs  Increase Vital Sign Frequency  Yellow: Q 2hr X 2 then Q 4hr X 2, if remains yellow, continue Q 4hrs  Escalate  MEWS: Escalate Yellow: discuss with charge nurse/RN and consider discussing with provider and RRT  Notify: Charge Nurse/RN  Name of Charge Nurse/RN Notified Angie RN  Date Charge Nurse/RN Notified 02/19/20  Time Charge Nurse/RN Notified 1923  Notify: Provider  Provider Name/Title Dr. Kalman Shan  Date Provider Notified 02/19/20  Time Provider Notified 1900  Notification Type Page  Notification Reason Change in status  Response Other (Comment) (Awaiting call back)  Document  Patient Outcome Other (Comment) (remains stable)  Progress note created (see row info) Yes   Change of shift, report given to [place] and Glenna Durand RN.  Waiting for Dr. Kalman Shan to call back.

## 2020-02-19 NOTE — Progress Notes (Signed)
Difficulty swallowing pm meds given in applesauce.  Pt.felt like meds were stuck in her throat.  Raised HOB to 90 degrees.  Pt. states that she has some trouble at times at home with swallowing.  Speech consult ordered.    Donah Driver, RN

## 2020-02-19 NOTE — Progress Notes (Signed)
Patient monitor alarmed for elvated HR, found to be in afib.  Patient sitting in bed, denies CP/SOB. BP152/95, HR 110-128 bpm, oxygen saturation 96%.  EKG being done. Cardiology paged, awaiting call back.

## 2020-02-19 NOTE — Progress Notes (Signed)
Progress Note  Patient Name: Crystal [name] Date of Encounter: 02/19/2020  Primary Cardiologist: Rozann Lesches, MD  Subjective   Denies any chest pain.  Nausea much improved this am and ate breakfast.  LE edema improved with compression hose.  Had SOB this am and given Lasix 60mg  IV  Inpatient Medications    Scheduled Meds: . amiodarone  200 mg Oral BID  . calcium-vitamin D  1 tablet Oral QAC lunch  . cholecalciferol  1,000 Units Oral Q breakfast  . clopidogrel  75 mg Oral q morning - 10a  . ferrous sulfate  325 mg Oral QAC lunch  . furosemide  60 mg Intravenous Once  . hydrALAZINE  25 mg Oral Q8H  . isosorbide mononitrate  30 mg Oral QHS  . metoprolol tartrate  25 mg Oral BID  . pantoprazole  40 mg Oral Daily  . polycarbophil  625 mg Oral Daily  . rosuvastatin  2.5 mg Oral Q48H  . saccharomyces boulardii  250 mg Oral BID  . sodium chloride flush  3 mL Intravenous Q12H   Continuous Infusions: . sodium chloride     PRN Meds: sodium chloride, acetaminophen, alum & mag hydroxide-simeth, bisacodyl, magnesium hydroxide, nitroGLYCERIN, ondansetron (ZOFRAN) IV   Vital Signs    Vitals:   02/18/20 1724 02/18/20 2110 02/19/20 0509 02/19/20 0732  BP: (!) 170/60 (!) 170/70 (!) 178/58 (!) 160/59  Pulse: 61 63 (!) 57 (!) 59  Resp: (!) 22 20 (!) 23 (!) 24  Temp:  97.8 F (36.6 C) 98 F (36.7 C) 97.6 F (36.4 C)  TempSrc:  Oral Oral Oral  SpO2: 96% 95% 96% 96%  Weight:   67.7 kg   Height:        Intake/Output Summary (Last 24 hours) at 02/19/2020 0836 Last data filed at 02/19/2020 0700 Gross per 24 hour  Intake 1149.48 ml  Output 1200 ml  Net -50.52 ml   Last 3 Weights 02/19/2020 02/17/2020 02/16/2020  Weight (lbs) 149 lb 3.2 oz 147 lb 7.8 oz 147 lb 6.4 oz  Weight (kg) 67.677 kg 66.9 kg 66.86 kg     Telemetry    NSR - Personally Reviewed  Physical Exam   GEN: Well nourished, well developed in no acute distress HEENT: Normal NECK: No JVD; No carotid  bruits LYMPHATICS: No lymphadenopathy CARDIAC:RRR, no murmurs, rubs, gallops RESPIRATORY:  Clear to auscultation without rales, wheezing or rhonchi  ABDOMEN: Soft, non-tender, non-distended MUSCULOSKELETAL:  Trace BLE edema; No deformity  SKIN: Warm and dry NEUROLOGIC:  Alert and oriented x 3 PSYCHIATRIC:  Normal affect    Labs    High Sensitivity Troponin:   Recent Labs  Lab 02/12/20 1726 02/12/20 2300 02/13/20 1126 02/13/20 1330  TROPONINIHS 254* 1,735* 1,229* 1,055*      Cardiac EnzymesNo results for input(s): TROPONINI in the last 168 hours. No results for input(s): TROPIPOC in the last 168 hours.   Chemistry Recent Labs  Lab 02/17/20 0302 02/18/20 0225 02/19/20 0231  NA 126* 127* 127*  K 4.9 4.7 4.3  CL 94* 96* 96*  CO2 20* 19* 18*  GLUCOSE 112* 116* 98  BUN 50* 53* 56*  CREATININE 3.23* 3.47* 3.29*  CALCIUM 8.4* 8.4* 8.7*  ALBUMIN  --  2.8* 3.6  GFRNONAA 14* 13* 13*  ANIONGAP 12 12 13      Hematology Recent Labs  Lab 02/17/20 0302 02/18/20 0225 02/19/20 0231  WBC 8.2 7.7 7.9  RBC 2.88* 2.93* 3.09*  HGB 8.2* 8.4* 8.5*  HCT 23.8* 24.4* 26.1*  MCV 82.6 83.3 84.5  MCH 28.5 28.7 27.5  MCHC 34.5 34.4 32.6  RDW 15.3 15.7* 15.8*  PLT 233 230 217    BNP Recent Labs  Lab 02/13/20 0355 02/18/20 0225  BNP 4,423.6* 2,867.6*     DDimer No results for input(s): DDIMER in the last 168 hours.   Radiology    DG Chest 2 View  Result Date: 02/17/2020 CLINICAL DATA:  Shortness of breath. EXAM: CHEST - 2 VIEW COMPARISON:  February 12, 2020. FINDINGS: Stable cardiomediastinal silhouette. No pneumothorax is noted. Small bilateral pleural effusions are noted. Mild left basilar subsegmental atelectasis or infiltrate may be present. Bony thorax is unremarkable. IMPRESSION: Small bilateral pleural effusions. Mild left basilar subsegmental atelectasis or infiltrate may be present. Aortic Atherosclerosis (ICD10-I70.0). Electronically Signed   By: Marijo Conception M.D.    On: 02/17/2020 10:33   US RENAL  Result Date: 02/17/2020 CLINICAL DATA:  Acute on chronic kidney injury EXAM: RENAL / URINARY TRACT ULTRASOUND COMPLETE COMPARISON:  CT abdomen and pelvis 04/16/2019 FINDINGS: Right Kidney: Renal measurements: 10.3 x 4.3 x 4.4 = volume: 103 mL. Normal echogenicity. No hydronephrosis. 2.5 cm simple cyst seen in the mid kidney. Left Kidney: Renal measurements: 9.9 x 5.3 x 4.2 = volume: 117 mL. Normal echogenicity. No hydronephrosis. Prominence of the cortex in the mid left kidney is not significantly changed compared to prior examination and most likely represents normal anatomic variation given that hilar fat also changes contours in this region. Bladder: Position is limited due to shadowing bowel gas. Other: None. IMPRESSION: No significant abnormality of the kidneys. Electronically Signed   By: Miachel Roux M.D.   On: 02/17/2020 14:00   ECHOCARDIOGRAM LIMITED  Result Date: 02/17/2020    ECHOCARDIOGRAM LIMITED REPORT   Patient Name:   Crystal [name] Date of Exam: 02/17/2020 Medical Rec #:  [id number]          Height:       65.0 in Accession #:    [id number]         Weight:       147.5 lb Date of Birth:  December 18, 1934          BSA:          1.738 m Patient Age:    86 years           BP:           150/57 mmHg Patient Gender: F                  HR:           63 bpm. Exam Location:  Inpatient Procedure: Limited Echo, Limited Color Doppler and Cardiac Doppler Indications:    NSTEMI  History:        Patient has prior history of Echocardiogram examinations, most                 recent 01/17/2020. CAD; Risk Factors:Dyslipidemia and Diabetes.  Sonographer:    Johny Chess Referring Phys: 12 [place]  1. Compared with the echo 25/9563, systolic function is worse and the pleural effusion is new.  2. Hypokinesis of the basal to mid anteroseptal, inferoseptal and inferior myocardium. Left ventricular ejection fraction, by estimation, is 40 to 45%. The left ventricle  has mildly decreased function. The left ventricle demonstrates regional wall motion abnormalities (see scoring diagram/findings for description). Left ventricular diastolic parameters are consistent with Grade II diastolic dysfunction (pseudonormalization). Elevated  left ventricular end-diastolic pressure.  3. Moderate pleural effusion in the left lateral region.  4. Mild to moderate mitral valve regurgitation.  5. Tricuspid valve regurgitation is moderate.  6. The aortic valve is tricuspid. Aortic valve regurgitation is mild.  7. There is moderately elevated pulmonary artery systolic pressure.  8. The inferior vena cava is dilated in size with <50% respiratory variability, suggesting right atrial pressure of 15 mmHg. FINDINGS  Left Ventricle: Hypokinesis of the basal to mid anteroseptal, inferoseptal and inferior myocardium. Left ventricular ejection fraction, by estimation, is 40 to 45%. The left ventricle has mildly decreased function. The left ventricle demonstrates regional wall motion abnormalities. Left ventricular diastolic parameters are consistent with Grade II diastolic dysfunction (pseudonormalization). Elevated left ventricular end-diastolic pressure. Right Ventricle: There is moderately elevated pulmonary artery systolic pressure. The tricuspid regurgitant velocity is 2.86 m/s, and with an assumed right atrial pressure of 15 mmHg, the estimated right ventricular systolic pressure is 77.9 mmHg. Pericardium: Trivial pericardial effusion is present. Mitral Valve: Mild mitral annular calcification. Mild to moderate mitral valve regurgitation. Tricuspid Valve: Tricuspid valve regurgitation is moderate. Aortic Valve: The aortic valve is tricuspid. Aortic valve regurgitation is mild. Pulmonic Valve: Pulmonic valve regurgitation is trivial. Venous: The inferior vena cava is dilated in size with less than 50% respiratory variability, suggesting right atrial pressure of 15 mmHg. Additional Comments: There is a  moderate pleural effusion in the left lateral region. LEFT VENTRICLE PLAX 2D LVIDd:         4.60 cm  Diastology LVIDs:         3.50 cm  LV e' medial:    4.35 cm/s LV PW:         1.20 cm  LV E/e' medial:  33.1 LV IVS:        1.00 cm  LV e' lateral:   6.85 cm/s LVOT diam:     1.70 cm  LV E/e' lateral: 21.0 LV SV:         39 LV SV Index:   22 LVOT Area:     2.27 cm  IVC IVC diam: 2.60 cm LEFT ATRIUM         Index LA diam:    3.60 cm 2.07 cm/m  AORTIC VALVE LVOT Vmax:   87.50 cm/s LVOT Vmean:  57.700 cm/s LVOT VTI:    0.170 m  AORTA Ao Root diam: 2.70 cm Ao Asc diam:  2.60 cm MITRAL VALVE                TRICUSPID VALVE MV Area (PHT): 2.20 cm     TR Peak grad:   32.7 mmHg MV Decel Time: 345 msec     TR Vmax:        286.00 cm/s MV E velocity: 144.00 cm/s MV A velocity: 98.30 cm/s   SHUNTS MV E/A ratio:  1.46         Systemic VTI:  0.17 m                             Systemic Diam: 1.70 cm Skeet Latch MD Electronically signed by Skeet Latch MD Signature Date/Time: 02/17/2020/3:43:24 PM    Final     Cardiac Studies   Cardiac Cath 02/13/20   Non-stenotic Mid LAD lesion was previously treated.  Prox Cx to Mid Cx lesion is 20% stenosed.  Dist RCA lesion is 20% stenosed.  Non-stenotic Mid RCA lesion was previously treated.  Hemodynamic findings  consistent with mild pulmonary hypertension.  LV end diastolic pressure is mildly elevated.   1. Nonobstructive CAD. Continued excellent patency of stents in the LAD and RCA 2. Mildly elevated LV filling pressures 3. Mildly elevated right heart pressures. 4. Normal cardiac output  Plan: continue medical therapy. Diuresis. BP control.  2D Echo 01/17/20  1. Left ventricular ejection fraction, by estimation, is 50 to 55%. The  left ventricle has low normal function. The left ventricle demonstrates  regional wall motion abnormalities (see scoring diagram/findings for  description). There is moderate left  ventricular hypertrophy. Left ventricular  diastolic parameters are  consistent with Grade I diastolic dysfunction (impaired relaxation).  Elevated left ventricular end-diastolic pressure. The E/e' is 21. There is  moderate hypokinesis of the left  ventricular, basal-mid inferior wall.  2. Right ventricular systolic function is hyperdynamic. The right  ventricular size is normal. There is normal pulmonary artery systolic  pressure.  3. Left atrial size was moderately dilated.  4. The mitral valve is abnormal. Trivial mitral valve regurgitation.  5. The aortic valve is tricuspid. Aortic valve regurgitation is not  visualized.  6. The inferior vena cava is normal in size with <50% respiratory  variability, suggesting right atrial pressure of 8 mmHg.   Comparison(s): No significant change from prior study. 01/16/20: LVEF  50-55%.   Patient Profile     86 y.o. female with CAD s/p DES to mLAD 01/2019 and recent inferior STEMI s/p DES to [facility] 12/2019, prior ICM with improved LVEF (50-55% EF by echo 12/2019), chronic combined CHF, HLD (statin intolerance), HLD and CKD III presented with few weeks hx of DOE, found to have NSTEMI with acut eon chronic systolic CHF. Hospital course complicated by AKI on CKD, iron deficiency anemia with heme positive stools, and new onset atrial fib RVR.  Assessment & Plan    1. Shortness of breath - presented with findings suspicious for NSTEMI - hsTn peaked at 1735.  - Underwent cardiac cath noted above with patent stents in the RCA and LAD  - Recommendation to continue medical therapy on ASA and Plavix.  - Aspirin was stopped initially due to need for addition of Eliquis but that is also now being stopped due to declining hemoglobin and concerns over GI bleeding.  - continue Plavix for now -continueBB, Imdur, and low dose statin (statin intolerant in the past)  2. Acute on chronic combined CHF - last EF assessed 01/17/20 at 50-55%, but this was prior to this admission - repeat limited echo  02/17/2020 showed a decline in EF at 40-45% with HK of the basal to mid AS/IS and inferior myocardium and G2DD with elevated LV filling pressures - BNP >4000 with elevated filling pressures on cath. - Given IV lasix with little diuresis, and rise in Cr -> subsequently treated with IV fluids but now with increased LE edema and BNP 2867 (had been 4423 a week ago) - decreased BS noted at R lung base and Cxray  with small B/L pleural effusions and atelectasis - she put out 1.2L yesterday but I&Os since admit incomplete - weight up 2lbs from yesterday and up 8lbs from admit - SCr appears to have plataued today - she appears mildly volume overloaded with mild LE edema - O2 sats 96% on RA this am - nephrology consulted and feel likely combination of cardiorenal syndrome from afib with RVR/acute CHF and diuretics as well as contrast nephropathy from recent cath -IVF had been on hold but given Lasix 60mg  IV this  am due to SOB and weight gain -renal has recommended once renal function improved to start oral diuretics but given 60mg  IV Lasix this am for volume overload -continue fluid restriction given low Na at 126  On 1/21 but improving and 127 today -continueTED hose stockings to help with LE edema -increase nutritional suppl due to low albumin that is likely contributing to LE edema as well -no ACE/ARB/ARNi/spiro due to AKI on CKD -continue Lopressor 25mg  BID and Imdur 30mg  daily -increase Hydralazine to 50mg  TID due to elevated BP as high as 178/43mmHg -await further recs from nephrology  3. AKI on CKD III with worsening hyponatremia - baseline Cr quite variable recently anywhere from 1.3-1.9, admitted at 1.66 -> 3.47 but appears to have peaked and now down from 3.47 yesterday to 3.29 today - IVF stopped given increasing edema and decreasing sodium level - no obstruction on renal US - bladder scans with minimal residual per nurse - nephrology following and feels patient has contrast nephropathy  with cardiorenal syndrome from afib with RVR, CHF and diuretic use - diuretics on hold and continue fluid restriction for low Na - given 60mg  IV Lasix this am for volume overload  3.IDAnemia/ + Occult stools:10.2>>8.5>>7.9 - basliene hemoglobin appeared around 9-10 recently, nadir 7.9 this admission, with hemoglobin in the low-mid 8 range now - s/p IV iron x1, occult stool positive  - GI consulted with recommendations for conservative management - they were aware of dark stools/+FOBT but felt anemia was largely due to her chronic kidney disease - continue PPI - see below regarding Eliquis - continue daily CBC and appreciate nephro input on optimization as well  4. New onset Afib RVR:  - briefly noted this admission on 02/15/20, converted to NSR quickly after use of IV amiodarone - continue amiodarone to 200mg  BID given potential GI side effects of nausea/dry heaving>> she has some mild nausea today - Eliquis stopped given anemia/GI bleeding issues, worsening renal function and very transient nature of arrhythmia - can revisit if this recurs  5. HTN - managed in context of the above>>BP elevated at 178/64mmHg today -continue Imdur and BB -increase Hydralazine to 50mg  TID but cautiously to avoid acute drop in Bp and reduction in renal perfusion which could worsen renal function - patient unhappy with hydralazine but alternatives otherwise limited at this time given her renal issues  6. HLD - prior statin intolerance - pitavastatin and PCSK9 too expensive, agreeable to Crestor 2.5mg  every other day - lipid profile this admission with LDL 83, trig 74, HDL 56  I have spent a total of 35 minutes with patient reviewing 2D echo , telemetry, EKGs, labs and examining patient as well as establishing an assessment and plan that was discussed with the patient.  > 50% of time was spent in direct patient care.    For questions or updates, please contact [place] Please consult [email or web address]  for contact info under Cardiology/STEMI.  Signed, Fransico Him, MD 02/19/2020, 8:36 AM

## 2020-02-19 NOTE — Progress Notes (Signed)
SLP Cancellation Note  Patient Details Name: Crystal [name] MRN: [id number] DOB: 12/27/1934   Cancelled treatment:       Reason Eval/Treat Not Completed: Patient at procedure or test/unavailable (Bedside swallow evaluation was attempted but pt was receiving a bath at the time. SLP will reattempt as today's schedule allows.)  Jorge Amparo I. Hardin Negus, [place], [place] Office number [phone] Pager [place] 02/19/2020, 5:15 PM

## 2020-02-19 NOTE — Plan of Care (Signed)
Called regarding rhythm change, HR up to 130.   She had brief AF earlier this admission and is on an oral amiodarone as well as metoprolol with sinus rates in the 50s. Eliquis was stopped a few days ago due to slow downtrend in her hemoglobin, felt unlikely to be GI etiology.   I reviewed her current ECG, which looks more likely a slow left-sided atypical atrial flutter than atrial fibrillation. She previously responded to amiodarone bolus so we will start with 150mg  IV on top of her oral load. Given her baseline sinus bradycardia, will attempt restoration of sinus rhythm before increasing metoprolol, as she remains asymptomatic with relatively modest rates. Will also restart Eliquis 2.5mg .

## 2020-02-19 NOTE — Progress Notes (Signed)
[facility] NEPHROLOGY PROGRESS NOTE  Assessment/ Plan: Pt is a 86 y.o. yo female  with history of hypertension, HLD, CKD 3 with baseline creatinine level around 1.3-1.9, chronic combined CHF, CAD who was admitted on 1/17 for acute CHF exacerbation and an NSTEMI, seen as a consultation for acute kidney injury on CKD.  #Acute kidney injury on CKD stage IIIb/IV: Likely due to contrast nephropathy in association with hemodynamic changes related with A. fib with RVR, CHF/cardiorenal syndrome. UA with minimal protein but has bacteria, no RBC. Reportedly bladder scan with no urinary retention.  The kidney ultrasound ruled out obstruction. Ordered Lasix 60 mg IV yesterday with urine output of 1.2 L.  Still having some shortness of breath therefore plan to order another dose of Lasix today.  Creatinine level trending down. Watch for renal recovery.  Avoid nephrotoxins or hypotensive episode. No need for dialysis.  #Acute CHF exacerbation: Volume up today therefore ordering IV Lasix.  Cardiology is following  #Hyponatremia, hypervolemic: Worsened with IV fluid and after stopping loop diuretics.  Recommend fluid restriction.  Diuretics as above.  # NSTEMI: Status post cardiac cath with stent placement in RCA and LAD.  On Plavix.  No Eliquis or aspirin due to GI bleed.  #Acute on chronic combined CHF, recent EF 50-55%.  Repeating echo with worsening systolic function.  #Anemia with stool occult blood positive: May have some component of CKD.  Holding anticoagulation.  Treating with IV iron.  Monitor hemoglobin.  #A. fib with RVR: On amiodarone and metoprolol.  Monitor heart rate.  #Hypertension: Blood pressure acceptable.  Continue current cardiac medication.  Avoid hypotensive episode in this elderly female.  Subjective: Seen and examined.  Urine output 1.2 L.  Patient reported that she feels better than yesterday.  Denies nausea, vomiting, chest pain however has some shortness of  breath.  Objective Vital signs in last 24 hours: Vitals:   02/18/20 2110 02/19/20 0509 02/19/20 0732 02/19/20 1113  BP: (!) 170/70 (!) 178/58 (!) 160/59 (!) 157/56  Pulse: 63 (!) 57 (!) 59 (!) 55  Resp: 20 (!) 23 (!) 24 20  Temp: 97.8 F (36.6 C) 98 F (36.7 C) 97.6 F (36.4 C) 98.6 F (37 C)  TempSrc: Oral Oral Oral Oral  SpO2: 95% 96% 96% 96%  Weight:  67.7 kg    Height:       Weight change: 0.777 kg  Intake/Output Summary (Last 24 hours) at 02/19/2020 1333 Last data filed at 02/19/2020 0700 Gross per 24 hour  Intake 909.48 ml  Output 1200 ml  Net -290.52 ml       Labs: Basic Metabolic Panel: Recent Labs  Lab 02/17/20 0302 02/18/20 0225 02/19/20 0231  NA 126* 127* 127*  K 4.9 4.7 4.3  CL 94* 96* 96*  CO2 20* 19* 18*  GLUCOSE 112* 116* 98  BUN 50* 53* 56*  CREATININE 3.23* 3.47* 3.29*  CALCIUM 8.4* 8.4* 8.7*   Liver Function Tests: Recent Labs  Lab 02/18/20 0225 02/19/20 0231  ALBUMIN 2.8* 3.6   No results for input(s): LIPASE, AMYLASE in the last 168 hours. No results for input(s): AMMONIA in the last 168 hours. CBC: Recent Labs  Lab 02/16/20 0604 02/16/20 2054 02/17/20 0302 02/18/20 0225 02/19/20 0231  WBC 9.4 9.4 8.2 7.7 7.9  HGB 8.8* 8.4* 8.2* 8.4* 8.5*  HCT 25.7* 25.9* 23.8* 24.4* 26.1*  MCV 82.6 83.0 82.6 83.3 84.5  PLT 285 257 233 230 217   Cardiac Enzymes: No results for input(s):  CKTOTAL, CKMB, CKMBINDEX, TROPONINI in the last 168 hours. CBG: Recent Labs  Lab 02/18/20 1113 02/18/20 1634 02/18/20 2109 02/19/20 0730 02/19/20 1112  GLUCAP 122* 99 112* 131* 112*    Iron Studies: No results for input(s): IRON, TIBC, TRANSFERRIN, FERRITIN in the last 72 hours. Studies/Results: ECHOCARDIOGRAM LIMITED  Result Date: 02/17/2020    ECHOCARDIOGRAM LIMITED REPORT   Patient Name:   TUERE NWOSU Date of Exam: 02/17/2020 Medical Rec #:  [id number]          Height:       65.0 in Accession #:    [id number]         Weight:       147.5 lb  Date of Birth:  09-Oct-1934          BSA:          1.738 m Patient Age:    41 years           BP:           150/57 mmHg Patient Gender: F                  HR:           63 bpm. Exam Location:  Inpatient Procedure: Limited Echo, Limited Color Doppler and Cardiac Doppler Indications:    NSTEMI  History:        Patient has prior history of Echocardiogram examinations, most                 recent 01/17/2020. CAD; Risk Factors:Dyslipidemia and Diabetes.  Sonographer:    Johny Chess Referring Phys: 32 Prentice  1. Compared with the echo 30/8657, systolic function is worse and the pleural effusion is new.  2. Hypokinesis of the basal to mid anteroseptal, inferoseptal and inferior myocardium. Left ventricular ejection fraction, by estimation, is 40 to 45%. The left ventricle has mildly decreased function. The left ventricle demonstrates regional wall motion abnormalities (see scoring diagram/findings for description). Left ventricular diastolic parameters are consistent with Grade II diastolic dysfunction (pseudonormalization). Elevated left ventricular end-diastolic pressure.  3. Moderate pleural effusion in the left lateral region.  4. Mild to moderate mitral valve regurgitation.  5. Tricuspid valve regurgitation is moderate.  6. The aortic valve is tricuspid. Aortic valve regurgitation is mild.  7. There is moderately elevated pulmonary artery systolic pressure.  8. The inferior vena cava is dilated in size with <50% respiratory variability, suggesting right atrial pressure of 15 mmHg. FINDINGS  Left Ventricle: Hypokinesis of the basal to mid anteroseptal, inferoseptal and inferior myocardium. Left ventricular ejection fraction, by estimation, is 40 to 45%. The left ventricle has mildly decreased function. The left ventricle demonstrates regional wall motion abnormalities. Left ventricular diastolic parameters are consistent with Grade II diastolic dysfunction (pseudonormalization). Elevated left  ventricular end-diastolic pressure. Right Ventricle: There is moderately elevated pulmonary artery systolic pressure. The tricuspid regurgitant velocity is 2.86 m/s, and with an assumed right atrial pressure of 15 mmHg, the estimated right ventricular systolic pressure is 84.6 mmHg. Pericardium: Trivial pericardial effusion is present. Mitral Valve: Mild mitral annular calcification. Mild to moderate mitral valve regurgitation. Tricuspid Valve: Tricuspid valve regurgitation is moderate. Aortic Valve: The aortic valve is tricuspid. Aortic valve regurgitation is mild. Pulmonic Valve: Pulmonic valve regurgitation is trivial. Venous: The inferior vena cava is dilated in size with less than 50% respiratory variability, suggesting right atrial pressure of 15 mmHg. Additional Comments: There is a moderate pleural effusion in the left lateral  region. LEFT VENTRICLE PLAX 2D LVIDd:         4.60 cm  Diastology LVIDs:         3.50 cm  LV e' medial:    4.35 cm/s LV PW:         1.20 cm  LV E/e' medial:  33.1 LV IVS:        1.00 cm  LV e' lateral:   6.85 cm/s LVOT diam:     1.70 cm  LV E/e' lateral: 21.0 LV SV:         39 LV SV Index:   22 LVOT Area:     2.27 cm  IVC IVC diam: 2.60 cm LEFT ATRIUM         Index LA diam:    3.60 cm 2.07 cm/m  AORTIC VALVE LVOT Vmax:   87.50 cm/s LVOT Vmean:  57.700 cm/s LVOT VTI:    0.170 m  AORTA Ao Root diam: 2.70 cm Ao Asc diam:  2.60 cm MITRAL VALVE                TRICUSPID VALVE MV Area (PHT): 2.20 cm     TR Peak grad:   32.7 mmHg MV Decel Time: 345 msec     TR Vmax:        286.00 cm/s MV E velocity: 144.00 cm/s MV A velocity: 98.30 cm/s   SHUNTS MV E/A ratio:  1.46         Systemic VTI:  0.17 m                             Systemic Diam: 1.70 cm Skeet Latch MD Electronically signed by Skeet Latch MD Signature Date/Time: 02/17/2020/3:43:24 PM    Final     Medications: Infusions: . sodium chloride      Scheduled Medications: . amiodarone  200 mg Oral BID  . calcium-vitamin D   1 tablet Oral QAC lunch  . cholecalciferol  1,000 Units Oral Q breakfast  . clopidogrel  75 mg Oral q morning - 10a  . ferrous sulfate  325 mg Oral QAC lunch  . hydrALAZINE  50 mg Oral Q8H  . isosorbide mononitrate  30 mg Oral QHS  . metoprolol tartrate  25 mg Oral BID  . pantoprazole  40 mg Oral Daily  . polycarbophil  625 mg Oral Daily  . rosuvastatin  2.5 mg Oral Q48H  . saccharomyces boulardii  250 mg Oral BID  . sodium chloride flush  3 mL Intravenous Q12H    have reviewed scheduled and prn medications.  Physical Exam: General:NAD, comfortable Heart:RRR, s1s2 nl Lungs: Basal rhonchi, no increased work of breathing Abdomen:soft, Non-tender, non-distended Extremities: Lower extremity edema slightly better than yesterday. Neurology: Alert awake and following commands, no tremor  Stefano Trulson Prasad Emerson Barretto 02/19/2020,1:33 PM  LOS: 6 days

## 2020-02-20 DIAGNOSIS — I214 Non-ST elevation (NSTEMI) myocardial infarction: Secondary | ICD-10-CM | POA: Diagnosis not present

## 2020-02-20 DIAGNOSIS — I480 Paroxysmal atrial fibrillation: Secondary | ICD-10-CM | POA: Diagnosis not present

## 2020-02-20 DIAGNOSIS — I5043 Acute on chronic combined systolic (congestive) and diastolic (congestive) heart failure: Secondary | ICD-10-CM | POA: Diagnosis not present

## 2020-02-20 DIAGNOSIS — I2510 Atherosclerotic heart disease of native coronary artery without angina pectoris: Secondary | ICD-10-CM | POA: Diagnosis not present

## 2020-02-20 LAB — BASIC METABOLIC PANEL
Anion gap: 12 (ref 5–15)
BUN: 52 mg/dL — ABNORMAL HIGH (ref 8–23)
CO2: 21 mmol/L — ABNORMAL LOW (ref 22–32)
Calcium: 8.4 mg/dL — ABNORMAL LOW (ref 8.9–10.3)
Chloride: 93 mmol/L — ABNORMAL LOW (ref 98–111)
Creatinine, Ser: 3.26 mg/dL — ABNORMAL HIGH (ref 0.44–1.00)
GFR, Estimated: 14 mL/min — ABNORMAL LOW (ref >60)
Glucose, Bld: 113 mg/dL — ABNORMAL HIGH (ref 70–99)
Potassium: 3.2 mmol/L — ABNORMAL LOW (ref 3.5–5.1)
Sodium: 126 mmol/L — ABNORMAL LOW (ref 135–145)

## 2020-02-20 LAB — CBC
HCT: 26.9 % — ABNORMAL LOW (ref 36.0–46.0)
Hemoglobin: 8.9 g/dL — ABNORMAL LOW (ref 12.0–15.0)
MCH: 27.4 pg (ref 26.0–34.0)
MCHC: 33.1 g/dL (ref 30.0–36.0)
MCV: 82.8 fL (ref 80.0–100.0)
Platelets: 242 10*3/uL (ref 150–400)
RBC: 3.25 MIL/uL — ABNORMAL LOW (ref 3.87–5.11)
RDW: 15.9 % — ABNORMAL HIGH (ref 11.5–15.5)
WBC: 7.5 10*3/uL (ref 4.0–10.5)
nRBC: 0 % (ref 0.0–0.2)

## 2020-02-20 LAB — GLUCOSE, CAPILLARY: Glucose-Capillary: 101 mg/dL — ABNORMAL HIGH (ref 70–99)

## 2020-02-20 MED ORDER — DIAZEPAM 2 MG PO TABS
2.0000 mg | ORAL_TABLET | Freq: Every day | ORAL | Status: DC | PRN
  Administered 2020-02-20 – 2020-02-28 (×2): 2 mg via ORAL
  Filled 2020-02-20 (×4): qty 1

## 2020-02-20 MED ORDER — AMIODARONE HCL 200 MG PO TABS
400.0000 mg | ORAL_TABLET | Freq: Two times a day (BID) | ORAL | Status: DC
  Administered 2020-02-20 – 2020-02-23 (×7): 400 mg via ORAL
  Filled 2020-02-20 (×7): qty 2

## 2020-02-20 MED ORDER — DOXEPIN HCL 10 MG/ML PO CONC
3.0000 mg | Freq: Every evening | ORAL | Status: DC | PRN
  Administered 2020-02-20: 3 mg via ORAL
  Filled 2020-02-20 (×2): qty 0.3

## 2020-02-20 MED ORDER — AMIODARONE LOAD VIA INFUSION
150.0000 mg | Freq: Once | INTRAVENOUS | Status: DC
  Filled 2020-02-20: qty 83.34

## 2020-02-20 NOTE — Consult Note (Signed)
   [facility] Inpatient Consult   02/20/2020  [place] 06/10/1934 [id number]  [employer] [ACO] Patient: Medicare  Follow up:   Call placed to hospital phone and spoke with patient regarding post hospital needs.  She consents to ongoing [facility] G-NP follow up.  She confirms she is going to her nephews when she is medically able to transition out of the hospital.  Reviewed inpatient acute [facility] RNCM notes regarding nephew's address for disposition.  Plan:  Will update [facility] G-NP of current disposition plan when appropriate.  For questions, please contact:  Natividad Brood, RN BSN [facility] Liaison  [phone] business mobile phone Toll free office [phone]  Fax number: [phone] [email or web address]

## 2020-02-20 NOTE — Progress Notes (Signed)
[facility] NEPHROLOG[facility] PROGRESS NOTE  Assessment/ Plan: Pt is a 86 y.o. yo female  with history of hypertension, HLD, CKD 3 with baseline creatinine level around 1.3-1.9, chronic combined CHF, CAD who was admitted on 1/17 for acute CHF exacerbation and an NSTEMI, seen as a consultation for acute kidney injury on CKD.  #Acute kidney injury on CKD stage IIIb/IV: Likely due to contrast nephropathy in association with hemodynamic changes related with A. fib with RVR, CHF/cardiorenal syndrome. UA with minimal protein but has bacteria, no RBC. Reportedly bladder scan with no urinary retention.  The kidney ultrasound ruled out obstruction.   Creatinine is now stable. Await renal recovery.  Avoid nephrotoxins or hypotensive episode. No need for dialysis.  #Acute CHF exacerbation: Has been diuresed - net neg 0.5kg per I/Os, weights are quite variable.  Volume status looks ok today.   #Hyponatremia, hypervolemic: Worsened with IV fluid and after stopping loop diuretics but not really improved with diuresis yesterday.  Cont na and fluid restriction.  Consider use of vaptan if not improving.   # NSTEMI: Status post cardiac cath with stent placement in RCA and LAD.  On Plavix.  No Eliquis or aspirin due to GI bleed.  #Anemia with stool occult blood positive: May have some component of CKD.  Anticoag was held but resumed at lower dose 1/23.   Treating with IV iron.  Monitor hemoglobin.  #A. fib with RVR: On amiodarone and metoprolol.  Monitor heart rate.  #Hypertension: Blood pressure acceptable.  Continue current cardiac medication.  Avoid hypotensive episode in this elderly female.  Subjective: Seen and examined. Recurrent A fib yesterday; reloaded IV amio, resumed eliquis 2.5 BID.  Ins 440/Urine output 2.2 L after lasix 60 IV yesterday.   Denies nausea, vomiting, chest pain.  Dyspnea improved compared to yesterday.  No BM x sev days.    Objective Vital signs in last 24  hours: Vitals:   02/19/20 2255 02/19/20 2340 02/20/20 0526 02/20/20 0630  BP: (!) 155/91 (!) 166/85 (!) 151/67 (!) 171/83  Pulse: (!) 120 (!) 104 98   Resp:  (!) 24 (!) 21   Temp:  97.6 F (36.4 C) 97.8 F (36.6 C)   TempSrc:  Oral Oral   SpO2:  95% 94%   Weight:   67.8 kg   Height:       Weight change: 0.091 kg  Intake/Output Summary (Last 24 hours) at 02/20/2020 0843 Last data filed at 02/20/2020 0500 Gross per 24 hour  Intake 440 ml  Output 2150 ml  Net -1710 ml       Labs: Basic Metabolic Panel: Recent Labs  Lab 02/18/20 0225 02/19/20 0231 02/20/20 0100  NA 127* 127* 126*  K 4.7 4.3 3.2*  CL 96* 96* 93*  CO2 19* 18* 21*  GLUCOSE 116* 98 113*  BUN 53* 56* 52*  CREATININE 3.47* 3.29* 3.26*  CALCIUM 8.4* 8.7* 8.4*   Liver Function Tests: Recent Labs  Lab 02/18/20 0225 02/19/20 0231  ALBUMIN 2.8* 3.6   No results for input(s): LIPASE, AMYLASE in the last 168 hours. No results for input(s): AMMONIA in the last 168 hours. CBC: Recent Labs  Lab 02/16/20 2054 02/17/20 0302 02/18/20 0225 02/19/20 0231 02/20/20 0100  WBC 9.4 8.2 7.7 7.9 7.5  HGB 8.4* 8.2* 8.4* 8.5* 8.9*  HCT 25.9* 23.8* 24.4* 26.1* 26.9*  MCV 83.0 82.6 83.3 84.5 82.8  PLT 257 233 230 217 242   Cardiac Enzymes: No results for input(s): CKTOTAL, CKMB, CKMBINDEX,  TROPONINI in the last 168 hours. CBG: Recent Labs  Lab 02/18/20 2109 02/19/20 0730 02/19/20 1112 02/19/20 1658 02/19/20 2122  GLUCAP 112* 131* 112* 92 125*    Iron Studies: No results for input(s): IRON, TIBC, TRANSFERRIN, FERRITIN in the last 72 hours. Studies/Results: No results found.  Medications: Infusions: . sodium chloride      Scheduled Medications: . amiodarone  200 mg Oral BID  . apixaban  2.5 mg Oral BID  . calcium-vitamin D  1 tablet Oral QAC lunch  . cholecalciferol  1,000 Units Oral Q breakfast  . clopidogrel  75 mg Oral q morning - 10a  . ferrous sulfate  325 mg Oral QAC lunch  . hydrALAZINE   50 mg Oral Q8H  . isosorbide mononitrate  30 mg Oral QHS  . metoprolol tartrate  25 mg Oral BID  . pantoprazole  40 mg Oral Daily  . polycarbophil  625 mg Oral Daily  . rosuvastatin  2.5 mg Oral Q48H  . saccharomyces boulardii  250 mg Oral BID  . sodium chloride flush  3 mL Intravenous Q12H    have reviewed scheduled and prn medications.  Physical Exam: General:NAD, comfortable Heart:RRR, s1s2 nl Lungs: clear ant Abdomen:soft, Non-tender, non-distended Extremities: trace to 1+ R ankle edema, minimal on L  Neurology: Alert awake and following commands, no tremor  Justin Mend 02/20/2020,8:43 AM  LOS: 7 days

## 2020-02-20 NOTE — Progress Notes (Signed)
Physical Therapy Treatment Patient Details Name: [name] MRN: [id number] DOB: 09-Mar-1934 Today's Date: 02/20/2020    History of Present Illness Pt adm with NSTEMI and acute on chronic heart failure. PMH - CAD, PCI 12/2019, STEMI 01/2019, HTN, ckd, breast CA    PT Comments    Pt making slow progress. Continues to be limited by dyspnea.    Follow Up Recommendations  Home health PT;Supervision for mobility/OOB     Equipment Recommendations  Other (comment) (rollator)    Recommendations for Other Services       Precautions / Restrictions Precautions Precautions: Fall    Mobility  Bed Mobility Overal bed mobility: Modified Independent             General bed mobility comments: Incr time  Transfers Overall transfer level: Needs assistance Equipment used: 4-wheeled walker Transfers: Sit to/from Stand Sit to Stand: Min guard         General transfer comment: Assist for safety  Ambulation/Gait Ambulation/Gait assistance: Min guard Gait Distance (Feet): 150 Feet Assistive device: 4-wheeled walker Gait Pattern/deviations: Step-through pattern;Decreased stride length Gait velocity: decr Gait velocity interpretation: <1.31 ft/sec, indicative of household ambulator General Gait Details: Assist for Therapist, music    Modified Rankin (Stroke Patients Only)       Balance Overall balance assessment: Needs assistance Sitting-balance support: No upper extremity supported;Feet supported Sitting balance-Leahy Scale: Good     Standing balance support: No upper extremity supported;During functional activity Standing balance-Leahy Scale: Fair                              Cognition Arousal/Alertness: Awake/alert Behavior During Therapy: Flat affect Overall Cognitive Status: Within Functional Limits for tasks assessed                                        Exercises       General Comments General comments (skin integrity, edema, etc.): Amb on RA with dyspnea 3/4 and SpO2 96%      Pertinent Vitals/Pain Pain Assessment: No/denies pain    Home Living                      Prior Function            PT Goals (current goals can now be found in the care plan section) Acute Rehab PT Goals Patient Stated Goal: return home Potential to Achieve Goals: Good Progress towards PT goals: Progressing toward goals    Frequency    Min 3X/week      PT Plan Current plan remains appropriate    Co-evaluation              AM-PAC PT "6 Clicks" Mobility   Outcome Measure  Help needed turning from your back to your side while in a flat bed without using bedrails?: None Help needed moving from lying on your back to sitting on the side of a flat bed without using bedrails?: None Help needed moving to and from a bed to a chair (including a wheelchair)?: A Little Help needed standing up from a chair using your arms (e.g., wheelchair or bedside chair)?: A Little Help needed to walk in hospital room?: A Little Help needed climbing 3-5 steps with  a railing? : A Little 6 Click Score: 20    End of Session   Activity Tolerance: Patient limited by fatigue Patient left: with call bell/phone within reach;in chair;with chair alarm set Nurse Communication: Mobility status PT Visit Diagnosis: Unsteadiness on feet (R26.81);Other abnormalities of gait and mobility (R26.89);Muscle weakness (generalized) (M62.81)     Time: 0786-7544 PT Time Calculation (min) (ACUTE ONLY): 24 min  Charges:  $Gait Training: 23-37 mins                     Yates Pager [phone] Office [place] 02/20/2020, 5:28 PM

## 2020-02-20 NOTE — Evaluation (Signed)
Clinical/Bedside Swallow Evaluation Patient Details  Name: Crystal [name] MRN: [id number] Date of Birth: Jul 08, 1934  Today's Date: 02/20/2020 Time: SLP Start Time (ACUTE ONLY): 1045 SLP Stop Time (ACUTE ONLY): 1058 SLP Time Calculation (min) (ACUTE ONLY): 13 min  Past Medical History:  Past Medical History:  Diagnosis Date  . Bell's palsy   . Breast cancer ([place]) 1998   Right mastectomy  . CAD (coronary artery disease)    a. s/p STEMI in 01/2019 with DES to mid-LAD  . CHF (congestive heart failure) ([place])    a. EF 30-35% by echo in 01/2019  . Essential hypertension   . GERD (gastroesophageal reflux disease)   . Gout   . History of skin cancer    Squamous cell, left shoulder  . Mixed hyperlipidemia   . Osteopenia   . Thyroid nodule   . Type 2 diabetes mellitus ([place])    Past Surgical History:  Past Surgical History:  Procedure Laterality Date  . BIOPSY  10/13/2019   Procedure: BIOPSY;  Surgeon: Daneil Dolin, MD;  [facility];  Service: Endoscopy;;  . CATARACT EXTRACTION  2016  . CORONARY ANGIOGRAPHY N/A 01/16/2020   Procedure: CORONARY ANGIOGRAPHY;  Surgeon: Jettie Booze, MD;  Location: [facility];  Service: Cardiovascular;  Laterality: N/A;  . CORONARY STENT INTERVENTION N/A 01/16/2020   Procedure: CORONARY STENT INTERVENTION;  Surgeon: Jettie Booze, MD;  Location: [place] [facility];  Service: Cardiovascular;  Laterality: N/A;  . CORONARY/GRAFT ACUTE MI REVASCULARIZATION N/A 01/30/2019   Procedure: Coronary/Graft Acute MI Revascularization;  Surgeon: Burnell Blanks, MD;  Location: [place];  Service: Cardiovascular;  Laterality: N/A;  . ESOPHAGOGASTRODUODENOSCOPY (EGD) WITH PROPOFOL N/A 10/13/2019   Non-obstructing Schatzki ring at GE junction, s/p dilation, erosive gastropathy with stigmata of recent bleeding, normal duodenum. Negative H.pylori.   Marland Kitchen HYSTEROSCOPY    . INTRAVASCULAR ULTRASOUND/IVUS N/A 01/16/2020    Procedure: Intravascular Ultrasound/IVUS;  Surgeon: Jettie Booze, MD;  Location: [facility];  Service: Cardiovascular;  Laterality: N/A;  . LEFT HEART CATH AND CORONARY ANGIOGRAPHY N/A 01/30/2019   Procedure: LEFT HEART CATH AND CORONARY ANGIOGRAPHY;  Surgeon: Burnell Blanks, MD;  Location: [place] [facility];  Service: Cardiovascular;  Laterality: N/A;  . LEFT HEART CATH AND CORONARY ANGIOGRAPHY N/A 01/16/2020   Procedure: LEFT HEART CATH AND CORONARY ANGIOGRAPHY;  Surgeon: Jettie Booze, MD;  Location: [place] [facility];  Service: Cardiovascular;  Laterality: N/A;  . Venia Minks DILATION N/A 10/13/2019   Procedure: Venia Minks DILATION;  Surgeon: Daneil Dolin, MD;  [facility];  Service: Endoscopy;  Laterality: N/A;  . Right mastectomy  1998   [place]  . RIGHT/LEFT HEART CATH AND CORONARY ANGIOGRAPHY N/A 02/13/2020   Procedure: RIGHT/LEFT HEART CATH AND CORONARY ANGIOGRAPHY;  Surgeon: Martinique, Peter M, MD;  Location: [place] [facility];  Service: Cardiovascular;  Laterality: N/A;   HPI:  Pt is an 86 y.o. female with CAD with recent PCI in 12/2019 (PCI to RCA) and STEMI in 01/2019 (PCI LAD), chronic systolic HF (recovery of EF to 50%), HTN, CKD. Pt presented with exertional shortness of breath and found to have NSTEMI. Pt s/p cardiac cath with patent stents in the RCA and LAD. CXR 1/21: Small bilateral pleural effusions. Mild left basilar subsegmental atelectasis or infiltrate. SLP was consulted since "patient had difficulty swallowing pm meds in applesauce."   Assessment / Plan / Recommendation Clinical Impression  Pt showed no s/s  of aspiration during all PO trials of ice chips, thin liquids, purees, or solids. Pt is able to self feed and appeared to benefit from smaller bites when consuming solids. Pt reported some difficulty when swallowing pills at baseline given esophageal history but does not report any other acute difficulties or concerns with swallowing.  Recomend continuation of regular diet with thin liquids. No f/u from SLP required at this time.  SLP Visit Diagnosis: Dysphagia, unspecified (R13.10)    Aspiration Risk  Mild aspiration risk    Diet Recommendation Regular;Thin liquid   Liquid Administration via: Cup;Straw Medication Administration: Whole meds with liquid Supervision: Patient able to self feed;Intermittent supervision to cue for compensatory strategies Compensations: Small sips/bites Postural Changes: Seated upright at 90 degrees;Remain upright for at least 30 minutes after po intake    Other  Recommendations Oral Care Recommendations: Oral care BID   Follow up Recommendations None      Frequency and Duration            Prognosis Prognosis for Safe Diet Advancement: Good      Swallow Study   General HPI: Pt is an 86 y.o. female with CAD with recent PCI in 12/2019 (PCI to RCA) and STEMI in 01/2019 (PCI LAD), chronic systolic HF (recovery of EF to 50%), HTN, CKD. Pt presented with exertional shortness of breath and found to have NSTEMI. Pt s/p cardiac cath with patent stents in the RCA and LAD. CXR 1/21: Small bilateral pleural effusions. Mild left basilar subsegmental atelectasis or infiltrate. SLP was consulted since "patient had difficulty swallowing pm meds in applesauce." Type of Study: Bedside Swallow Evaluation Previous Swallow Assessment: No Diet Prior to this Study: Regular;Thin liquids Temperature Spikes Noted: No Respiratory Status: Room air History of Recent Intubation: No Behavior/Cognition: Alert;Cooperative;Pleasant mood Oral Cavity Assessment: Within Functional Limits Oral Care Completed by SLP: No Oral Cavity - Dentition: Other (Comment) (Bottom teeth present, top unable to visualize) Vision: Functional for self-feeding Self-Feeding Abilities: Able to feed self Patient Positioning: Upright in bed Baseline Vocal Quality: Normal Volitional Cough: Strong Volitional Swallow: Able to elicit     Oral/Motor/Sensory Function Overall Oral Motor/Sensory Function: Other (comment) (Diffuclty moving tongue upward but all others WFL)   Ice Chips Ice chips: Within functional limits Presentation: Spoon   Thin Liquid Thin Liquid: Within functional limits Presentation: Self Fed;Cup;Straw    Nectar Thick Nectar Thick Liquid: Not tested   Honey Thick Honey Thick Liquid: Not tested   Puree Puree: Within functional limits Presentation: Self Fed   Solid   Jeanine Luz., SLP Student Solid: Within functional limits Presentation: Self Fed     02/20/2020,3:02 PM

## 2020-02-20 NOTE — Care Management Important Message (Signed)
Important Message  Patient Details  Name: Crystal [name] MRN: [id number] Date of Birth: 10-25-1934   Medicare Important Message Given:  Yes     [name] 02/20/2020, 3:53 PM

## 2020-02-20 NOTE — Progress Notes (Signed)
Progress Note  Patient Name: Crystal [name] Date of Encounter: 02/20/2020  Primary Cardiologist: Rozann Lesches, MD  Subjective   Had a brief run of atrial flutter with RVR last night and got a bolus of IV Amio but still in afib with RVR as high as 120's.   Denies any chest pain and SOB has improved.  Nausea has completely resolved.   Inpatient Medications    Scheduled Meds: . amiodarone  200 mg Oral BID  . apixaban  2.5 mg Oral BID  . calcium-vitamin D  1 tablet Oral QAC lunch  . cholecalciferol  1,000 Units Oral Q breakfast  . clopidogrel  75 mg Oral q morning - 10a  . ferrous sulfate  325 mg Oral QAC lunch  . hydrALAZINE  50 mg Oral Q8H  . isosorbide mononitrate  30 mg Oral QHS  . metoprolol tartrate  25 mg Oral BID  . pantoprazole  40 mg Oral Daily  . polycarbophil  625 mg Oral Daily  . rosuvastatin  2.5 mg Oral Q48H  . saccharomyces boulardii  250 mg Oral BID  . sodium chloride flush  3 mL Intravenous Q12H   Continuous Infusions: . sodium chloride     PRN Meds: sodium chloride, acetaminophen, alum & mag hydroxide-simeth, bisacodyl, diazepam, doxepin, magnesium hydroxide, nitroGLYCERIN, ondansetron (ZOFRAN) IV   Vital Signs    Vitals:   02/19/20 2255 02/19/20 2340 02/20/20 0526 02/20/20 0630  BP: (!) 155/91 (!) 166/85 (!) 151/67 (!) 171/83  Pulse: (!) 120 (!) 104 98   Resp:  (!) 24 (!) 21   Temp:  97.6 F (36.4 C) 97.8 F (36.6 C)   TempSrc:  Oral Oral   SpO2:  95% 94%   Weight:   67.8 kg   Height:        Intake/Output Summary (Last 24 hours) at 02/20/2020 0830 Last data filed at 02/20/2020 0500 Gross per 24 hour  Intake 440 ml  Output 2150 ml  Net -1710 ml   Last 3 Weights 02/20/2020 02/19/2020 02/17/2020  Weight (lbs) 149 lb 6.4 oz 149 lb 3.2 oz 147 lb 7.8 oz  Weight (kg) 67.767 kg 67.677 kg 66.9 kg     Telemetry    Atrial fibrillation with RVR - Personally Reviewed  Physical Exam   GEN: Well nourished, well developed in no acute  distress HEENT: Normal NECK: No JVD; No carotid bruits LYMPHATICS: No lymphadenopathy CARDIAC:irregularly irregular and tachy, no murmurs, rubs, gallops RESPIRATORY:  Clear to auscultation without rales, wheezing or rhonchi  ABDOMEN: Soft, non-tender, non-distended MUSCULOSKELETAL:  No edema; No deformity  SKIN: Warm and dry NEUROLOGIC:  Alert and oriented x 3 PSYCHIATRIC:  Normal affect    Labs    High Sensitivity Troponin:   Recent Labs  Lab 02/12/20 1726 02/12/20 2300 02/13/20 1126 02/13/20 1330  TROPONINIHS 254* 1,735* 1,229* 1,055*      Cardiac EnzymesNo results for input(s): TROPONINI in the last 168 hours. No results for input(s): TROPIPOC in the last 168 hours.   Chemistry Recent Labs  Lab 02/18/20 0225 02/19/20 0231 02/20/20 0100  NA 127* 127* 126*  K 4.7 4.3 3.2*  CL 96* 96* 93*  CO2 19* 18* 21*  GLUCOSE 116* 98 113*  BUN 53* 56* 52*  CREATININE 3.47* 3.29* 3.26*  CALCIUM 8.4* 8.7* 8.4*  ALBUMIN 2.8* 3.6  --   GFRNONAA 13* 13* 14*  ANIONGAP 12 13 12      Hematology Recent Labs  Lab 02/18/20 0225 02/19/20 0231 02/20/20  0100  WBC 7.7 7.9 7.5  RBC 2.93* 3.09* 3.25*  HGB 8.4* 8.5* 8.9*  HCT 24.4* 26.1* 26.9*  MCV 83.3 84.5 82.8  MCH 28.7 27.5 27.4  MCHC 34.4 32.6 33.1  RDW 15.7* 15.8* 15.9*  PLT 230 217 242    BNP Recent Labs  Lab 02/18/20 0225  BNP 2,867.6*     DDimer No results for input(s): DDIMER in the last 168 hours.   Radiology    No results found.  Cardiac Studies   Cardiac Cath 02/13/20   Non-stenotic Mid LAD lesion was previously treated.  Prox Cx to Mid Cx lesion is 20% stenosed.  Dist RCA lesion is 20% stenosed.  Non-stenotic Mid RCA lesion was previously treated.  Hemodynamic findings consistent with mild pulmonary hypertension.  LV end diastolic pressure is mildly elevated.   1. Nonobstructive CAD. Continued excellent patency of stents in the LAD and RCA 2. Mildly elevated LV filling pressures 3. Mildly  elevated right heart pressures. 4. Normal cardiac output  Plan: continue medical therapy. Diuresis. BP control.  2D Echo 01/17/20  1. Left ventricular ejection fraction, by estimation, is 50 to 55%. The  left ventricle has low normal function. The left ventricle demonstrates  regional wall motion abnormalities (see scoring diagram/findings for  description). There is moderate left  ventricular hypertrophy. Left ventricular diastolic parameters are  consistent with Grade I diastolic dysfunction (impaired relaxation).  Elevated left ventricular end-diastolic pressure. The E/e' is 11. There is  moderate hypokinesis of the left  ventricular, basal-mid inferior wall.  2. Right ventricular systolic function is hyperdynamic. The right  ventricular size is normal. There is normal pulmonary artery systolic  pressure.  3. Left atrial size was moderately dilated.  4. The mitral valve is abnormal. Trivial mitral valve regurgitation.  5. The aortic valve is tricuspid. Aortic valve regurgitation is not  visualized.  6. The inferior vena cava is normal in size with <50% respiratory  variability, suggesting right atrial pressure of 8 mmHg.   Comparison(s): No significant change from prior study. 01/16/20: LVEF  50-55%.   Patient Profile     86 y.o. female with CAD s/p DES to mLAD 01/2019 and recent inferior STEMI s/p DES to [facility] 12/2019, prior ICM with improved LVEF (50-55% EF by echo 12/2019), chronic combined CHF, HLD (statin intolerance), HLD and CKD III presented with few weeks hx of DOE, found to have NSTEMI with acut eon chronic systolic CHF. Hospital course complicated by AKI on CKD, iron deficiency anemia with heme positive stools, and new onset atrial fib RVR.  Assessment & Plan    1. Shortness of breath - presented with findings suspicious for NSTEMI - hsTn peaked at 1735.  - Underwent cardiac cath noted above with patent stents in the RCA and LAD>> Recommendation to continue  medical therapy on ASA and Plavix.  - Aspirin was stopped due to need for addition of Eliquis  - continue Plavix  - continueBB, Imdur, and low dose statin (statin intolerant in the past)  2. Acute on chronic combined CHF - last EF assessed 01/17/20 at 50-55%, but this was prior to this admission - repeat limited echo 02/17/2020 showed a decline in EF at 40-45% with HK of the basal to mid AS/IS and inferior myocardium and G2DD with elevated LV filling pressures - BNP >4000 with elevated filling pressures on cath. - Given IV lasix with little diuresis, and rise in Cr -> subsequently treated with IV fluids but now with increased LE edema and  BNP 2867 (had been 4423 a week ago) -Cxray with small B/L pleural effusions and atelectasis - She put out 2.15L yesterday and is net neg 588cc since admit - weight remains up 8lbs from admit - O2 sats 94% on RA - nephrology consulted and feel likely combination of cardiorenal syndrome from afib with RVR/acute CHF and diuretics as well as contrast nephropathy from recent cath - IVF on hold - renal recommends no further diuretics for now until renal function improves and then start oral diuretics -SCr slowly improving and down to 3.26 today -continue fluid restrict given low Na which remains at 126 -continue TED hose stockings to help with LE edema -increase nutritional suppl due to low albumin that is likely contributing to LE edema as well -no ACE/ARB/ARNi/spiro due to AKI on CKD -continue Lopressor 25mg  BID and Imdur 30mg  daily -hydralazine increased to 50mg  TID yesterday and BP still borderline elevated>>continue to titrate as needed for BP control  3. AKI on CKD III with worsening hyponatremia - baseline Cr quite variable recently anywhere from 1.3-1.9, admitted at 1.66 and peaked at 3.47 and slowly trending downward to 3.26 today - IVF stopped given increasing edema and decreasing sodium level - no obstruction on renal US - bladder scans with  minimal residual per nurse - nephrology following and feels patient has contrast nephropathy with cardiorenal syndrome from afib with RVR, CHF and diuretic use - diuretics on hold and continue fluid restriction for low Na - start PO diuretics once renal function improves  3.IDAnemia/ + Occult stools:10.2>>8.5>>7.9 - basliene hemoglobin appeared around 9-10 recently, nadir 7.9 this admission, with hemoglobin in the low-mid 8 range now - s/p IV iron x1, occult stool positive  - GI consulted with recommendations for conservative management - they were aware of dark stools/+FOBT but felt anemia was largely due to her chronic kidney disease - continue PPI - Eliquis has been restarted - continue daily CBC and appreciate nephro input on optimization as well  4. New onset Afib RVR:  - briefly noted this admission on 02/15/20, converted to NSR quickly after use of IV amiodarone - had episode of atrial flutter with RVR last night and given 150mg  IV bolus>>and now in afib with RVR up to the 120's  - her nausea has completely resolved so will try to increase amiodarone to 400mg  BID with a bolus of Amio 150mg  IV to try to get her back in NSR - Eliquis initially stopped given anemia/GI bleeding issues, worsening renal function  - restarted on Eliquis 2.5mg  BID last night after re ocurrence of atrial arrhythmias with aflutter.   -continue Eliquis 2.5mg  BID (SCr >1.5, age > 53)  5. HTN -BP remains elevated -continue Hydralazine, Imdur and BB -titrate Hydralazine as needed -would avoid increase in BB as she is bradycardic when in NSR  6. HLD - prior statin intolerance - pitavastatin and PCSK9 too expensive, agreeable to Crestor 2.5mg  every other day - lipid profile this admission with LDL 83, trig 74, HDL 56  I have spent a total of 40 minutes with patient reviewing 2D echo , telemetry, EKGs, labs and examining patient as well as establishing an assessment and plan that was discussed with the  patient.  > 50% of time was spent in direct patient care.    For questions or updates, please contact [place] Please consult [email or web address] for contact info under Cardiology/STEMI.  Signed, Fransico Him, MD 02/20/2020, 8:30 AM

## 2020-02-20 NOTE — TOC Transition Note (Signed)
Transition of Care [facility]) - CM/SW Discharge Note   Patient Details  Name: Crystal [name] MRN: [id number] Date of Birth: [birth year]  [facility] CM/SW Contact:  Ninfa Meeker, RN Phone Number: 02/20/2020, 10:46 AM   Clinical Narrative:   Case manager spoke with patient concerning discharge plan. Choice for Home Health agencies was offered. Patient selected [facility]. Referral was called to Adela Lank, [facility] Liaison. Patient states she will go to her nephew's home in [place], and asked that I contact him for address. CM called Colin Ina [phone], His address is [place], [place]. [place] requests that he be called to arrange Home Health visits. CM shared this information with Adela Lank. TOC Team will continue to monitor.   Final next level of care: [place] Barriers to Discharge: Continued Medical Work upE   Patient Goals and CMS Choice Patient states their goals for this hospitalization and ongoing recovery are:: get better CMS Medicare.gov Compare Post Acute Care list provided to:: Patient Choice offered to / list presented to : Patient  Discharge Placement                       Discharge Plan and Services In-house Referral: NA Discharge Planning Services: CM Consult Post Acute Care Choice: Home Health          DME Arranged: N/A         HH Arranged: PT HH Agency: [place] Date [facility] Agency Contacted: 02/20/20 Time [place] Agency Contacted: [phone] Representative spoke with at [place]: Adela Lank  Social Determinants of Health (SDOH) Interventions     Readmission Risk Interventions No flowsheet data found.

## 2020-02-21 DIAGNOSIS — I2510 Atherosclerotic heart disease of native coronary artery without angina pectoris: Secondary | ICD-10-CM | POA: Diagnosis not present

## 2020-02-21 DIAGNOSIS — I214 Non-ST elevation (NSTEMI) myocardial infarction: Secondary | ICD-10-CM | POA: Diagnosis not present

## 2020-02-21 DIAGNOSIS — I5043 Acute on chronic combined systolic (congestive) and diastolic (congestive) heart failure: Secondary | ICD-10-CM | POA: Diagnosis not present

## 2020-02-21 DIAGNOSIS — I480 Paroxysmal atrial fibrillation: Secondary | ICD-10-CM | POA: Diagnosis not present

## 2020-02-21 LAB — GLUCOSE, CAPILLARY
Glucose-Capillary: 162 mg/dL — ABNORMAL HIGH (ref 70–99)
Glucose-Capillary: 188 mg/dL — ABNORMAL HIGH (ref 70–99)
Glucose-Capillary: 118 mg/dL — ABNORMAL HIGH (ref 70–99)
Glucose-Capillary: 106 mg/dL — ABNORMAL HIGH (ref 70–99)
Glucose-Capillary: 120 mg/dL — ABNORMAL HIGH (ref 70–99)

## 2020-02-21 LAB — CBC
HCT: 27.4 % — ABNORMAL LOW (ref 36.0–46.0)
Hemoglobin: 9 g/dL — ABNORMAL LOW (ref 12.0–15.0)
MCH: 27.5 pg (ref 26.0–34.0)
MCHC: 32.8 g/dL (ref 30.0–36.0)
MCV: 83.8 fL (ref 80.0–100.0)
Platelets: 261 10*3/uL (ref 150–400)
RBC: 3.27 MIL/uL — ABNORMAL LOW (ref 3.87–5.11)
RDW: 16.1 % — ABNORMAL HIGH (ref 11.5–15.5)
WBC: 7.7 10*3/uL (ref 4.0–10.5)
nRBC: 0 % (ref 0.0–0.2)

## 2020-02-21 LAB — BASIC METABOLIC PANEL
Anion gap: 12 (ref 5–15)
BUN: 54 mg/dL — ABNORMAL HIGH (ref 8–23)
CO2: 23 mmol/L (ref 22–32)
Calcium: 8.6 mg/dL — ABNORMAL LOW (ref 8.9–10.3)
Chloride: 93 mmol/L — ABNORMAL LOW (ref 98–111)
Creatinine, Ser: 2.99 mg/dL — ABNORMAL HIGH (ref 0.44–1.00)
GFR, Estimated: 15 mL/min — ABNORMAL LOW (ref >60)
Glucose, Bld: 103 mg/dL — ABNORMAL HIGH (ref 70–99)
Potassium: 3.5 mmol/L (ref 3.5–5.1)
Sodium: 128 mmol/L — ABNORMAL LOW (ref 135–145)

## 2020-02-21 MED ORDER — WHITE PETROLATUM EX OINT
TOPICAL_OINTMENT | CUTANEOUS | Status: AC
  Filled 2020-02-21: qty 28.35

## 2020-02-21 NOTE — Progress Notes (Signed)
Crystal [name]  Assessment/ Plan: Pt is a 86 y.o. yo female  with history of hypertension, HLD, CKD 3 with baseline creatinine level around 1.3-1.9, chronic combined CHF, CAD who was admitted on 1/17 for acute CHF exacerbation and an NSTEMI, seen as a consultation for acute kidney injury on CKD.  #Acute kidney injury on CKD stage IIIb/IV: Likely due to contrast nephropathy in association with hemodynamic changes related with A. fib with RVR, CHF/cardiorenal syndrome. UA with minimal protein but has bacteria, no RBC. Reportedly bladder scan with no urinary retention.  The kidney ultrasound ruled out obstruction.   Creatinine is now slowly improving from peak of 3.5 on 1/22 to 2.99 today. Hopeful for ongoing recovery.  Avoid nephrotoxins or hypotensive episode. No need for dialysis.  #Acute CHF exacerbation: Has been diuresed - net neg 0.5kg per I/Os, weights are quite variable.  Volume status looks ok today.  Would favor holding diuretics still.   #Hyponatremia, hypervolemic: Worsened with IV fluid and after stopping loop diuretics but then again worse after diuresis.  Cont na and fluid restriction. Now improved after holding diuretics yesterday with pt ~ net even over past 24h.    # NSTEMI: Status post cardiac cath with stent placement in RCA and LAD.  On Plavix.  No Eliquis or aspirin due to GI bleed.  #Anemia with stool occult blood positive:  Anticoag was held but resumed at lower dose 1/23.   Treated with IV iron.  Monitor hemoglobin, currently stable a 9.  #A. fib with RVR: On amiodarone and metoprolol with cardiology managing.  #Hypertension: Blood pressure high and cardiology has been adjusting meds.   Subjective: Seen and examined. Notes reviewed - PT limited by dyspnea, but making progress.  I/os yesterday 840 / 750.  Labs this AM with sodium up from 126 to 128, Cr 2.99 from 3.26.  Hb stable 9. Denies uremic symptoms.   Objective Vital  signs in last 24 hours: Vitals:   02/20/20 1024 02/20/20 1400 02/20/20 2240 02/21/20 0552  BP: (!) 141/52 (!) 148/52 (!) 169/57 (!) 165/70  Pulse: (!) 58 (!) 56 (!) 58 (!) 56  Resp: (!) 21 (!) 24 (!) 23 15  Temp: 97.7 F (36.5 C) 97.7 F (36.5 C) 98.1 F (36.7 C) 98.6 F (37 C)  TempSrc: Oral Oral Oral Oral  SpO2: 98% 95% 96% 96%  Weight:      Height:       Weight change:   Intake/Output Summary (Last 24 hours) at 02/21/2020 0720 Last data filed at 02/20/2020 2300 Gross per 24 hour  Intake 843 ml  Output 750 ml  Net 93 ml       Labs: Basic Metabolic Panel: Recent Labs  Lab 02/19/20 0231 02/20/20 0100 02/21/20 0252  NA 127* 126* 128*  K 4.3 3.2* 3.5  CL 96* 93* 93*  CO2 18* 21* 23  GLUCOSE 98 113* 103*  BUN 56* 52* 54*  CREATININE 3.29* 3.26* 2.99*  CALCIUM 8.7* 8.4* 8.6*   Liver Function Tests: Recent Labs  Lab 02/18/20 0225 02/19/20 0231  ALBUMIN 2.8* 3.6   No results for input(s): LIPASE, AMYLASE in the last 168 hours. No results for input(s): AMMONIA in the last 168 hours. CBC: Recent Labs  Lab 02/17/20 0302 02/18/20 0225 02/19/20 0231 02/20/20 0100 02/21/20 0252  WBC 8.2 7.7 7.9 7.5 7.7  HGB 8.2* 8.4* 8.5* 8.9* 9.0*  HCT 23.8* 24.4* 26.1* 26.9* 27.4*  MCV 82.6 83.3 84.5 82.8 83.8  PLT 233 230 217 242 261   Cardiac Enzymes: No results for input(s): CKTOTAL, CKMB, CKMBINDEX, TROPONINI in the last 168 hours. CBG: Recent Labs  Lab 02/19/20 0730 02/19/20 1112 02/19/20 1658 02/19/20 2122 02/20/20 2236  GLUCAP 131* 112* 92 125* 101*    Iron Studies: No results for input(s): IRON, TIBC, TRANSFERRIN, FERRITIN in the last 72 hours. Studies/Results: No results found.  Medications: Infusions: . sodium chloride      Scheduled Medications: . amiodarone  150 mg Intravenous Once  . amiodarone  400 mg Oral BID  . apixaban  2.5 mg Oral BID  . calcium-vitamin D  1 tablet Oral QAC lunch  . cholecalciferol  1,000 Units Oral Q breakfast  .  clopidogrel  75 mg Oral q morning - 10a  . ferrous sulfate  325 mg Oral QAC lunch  . hydrALAZINE  50 mg Oral Q8H  . isosorbide mononitrate  30 mg Oral QHS  . metoprolol tartrate  25 mg Oral BID  . pantoprazole  40 mg Oral Daily  . polycarbophil  625 mg Oral Daily  . rosuvastatin  2.5 mg Oral Q48H  . saccharomyces boulardii  250 mg Oral BID  . sodium chloride flush  3 mL Intravenous Q12H    have reviewed scheduled and prn medications.  Physical Exam: General:NAD, comfortable Heart:sinus brady in high 50s, s1s2 nl Lungs: clear ant Abdomen:soft, Non-tender, non-distended Extremities: trace ankle edema Neurology: Alert awake and following commands, no tremor  Ria Comment A Iam Lipson 02/21/2020,7:20 AM  LOS: 8 days

## 2020-02-21 NOTE — Progress Notes (Signed)
Progress Note  Patient Name: [name] Date of Encounter: 02/21/2020  Primary Cardiologist: Rozann Lesches, MD  Subjective   Converted to NSR yesterday.  Denies any chest pain or SOB.    Inpatient Medications    Scheduled Meds: . amiodarone  150 mg Intravenous Once  . amiodarone  400 mg Oral BID  . apixaban  2.5 mg Oral BID  . calcium-vitamin D  1 tablet Oral QAC lunch  . cholecalciferol  1,000 Units Oral Q breakfast  . clopidogrel  75 mg Oral q morning - 10a  . ferrous sulfate  325 mg Oral QAC lunch  . hydrALAZINE  50 mg Oral Q8H  . isosorbide mononitrate  30 mg Oral QHS  . metoprolol tartrate  25 mg Oral BID  . pantoprazole  40 mg Oral Daily  . polycarbophil  625 mg Oral Daily  . rosuvastatin  2.5 mg Oral Q48H  . saccharomyces boulardii  250 mg Oral BID  . sodium chloride flush  3 mL Intravenous Q12H   Continuous Infusions: . sodium chloride     PRN Meds: sodium chloride, acetaminophen, alum & mag hydroxide-simeth, bisacodyl, diazepam, doxepin, magnesium hydroxide, nitroGLYCERIN, ondansetron (ZOFRAN) IV   Vital Signs    Vitals:   02/20/20 2240 02/21/20 0552 02/21/20 0721 02/21/20 0857  BP: (!) 169/57 (!) 165/70 116/78 (!) 151/52  Pulse: (!) 58 (!) 56 (!) 58 (!) 57  Resp: (!) 23 15 15  (!) 24  Temp: 98.1 F (36.7 C) 98.6 F (37 C) 98.4 F (36.9 C)   TempSrc: Oral Oral Oral   SpO2: 96% 96% 96% 98%  Weight:   66.6 kg   Height:        Intake/Output Summary (Last 24 hours) at 02/21/2020 0943 Last data filed at 02/21/2020 1751 Gross per 24 hour  Intake 843 ml  Output 1050 ml  Net -207 ml   Last 3 Weights 02/21/2020 02/20/2020 02/19/2020  Weight (lbs) 146 lb 14.4 oz 149 lb 6.4 oz 149 lb 3.2 oz  Weight (kg) 66.633 kg 67.767 kg 67.677 kg     Telemetry     Sinus bradycardia at 59bpm - Personally Reviewed  Physical Exam   GEN: Well nourished, well developed in no acute distress HEENT: Normal NECK: No JVD; No carotid bruits LYMPHATICS: No  lymphadenopathy CARDIAC:RRR, no murmurs, rubs, gallops RESPIRATORY:  Clear to auscultation without rales, wheezing or rhonchi  ABDOMEN: Soft, non-tender, non-distended MUSCULOSKELETAL:  No edema; No deformity  SKIN: Warm and dry NEUROLOGIC:  Alert and oriented x 3 PSYCHIATRIC:  Normal affect    Labs    High Sensitivity Troponin:   Recent Labs  Lab 02/12/20 1726 02/12/20 2300 02/13/20 1126 02/13/20 1330  TROPONINIHS 254* 1,735* 1,229* 1,055*      Cardiac EnzymesNo results for input(s): TROPONINI in the last 168 hours. No results for input(s): TROPIPOC in the last 168 hours.   Chemistry Recent Labs  Lab 02/18/20 0225 02/19/20 0231 02/20/20 0100 02/21/20 0252  NA 127* 127* 126* 128*  K 4.7 4.3 3.2* 3.5  CL 96* 96* 93* 93*  CO2 19* 18* 21* 23  GLUCOSE 116* 98 113* 103*  BUN 53* 56* 52* 54*  CREATININE 3.47* 3.29* 3.26* 2.99*  CALCIUM 8.4* 8.7* 8.4* 8.6*  ALBUMIN 2.8* 3.6  --   --   GFRNONAA 13* 13* 14* 15*  ANIONGAP 12 13 12 12      Hematology Recent Labs  Lab 02/19/20 0231 02/20/20 0100 02/21/20 0252  WBC 7.9 7.5 7.7  RBC 3.09* 3.25* 3.27*  HGB 8.5* 8.9* 9.0*  HCT 26.1* 26.9* 27.4*  MCV 84.5 82.8 83.8  MCH 27.5 27.4 27.5  MCHC 32.6 33.1 32.8  RDW 15.8* 15.9* 16.1*  PLT 217 242 261    BNP Recent Labs  Lab 02/18/20 0225  BNP 2,867.6*     DDimer No results for input(s): DDIMER in the last 168 hours.   Radiology    No results found.  Cardiac Studies   Cardiac Cath 02/13/20   Non-stenotic Mid LAD lesion was previously treated.  Prox Cx to Mid Cx lesion is 20% stenosed.  Dist RCA lesion is 20% stenosed.  Non-stenotic Mid RCA lesion was previously treated.  Hemodynamic findings consistent with mild pulmonary hypertension.  LV end diastolic pressure is mildly elevated.   1. Nonobstructive CAD. Continued excellent patency of stents in the LAD and RCA 2. Mildly elevated LV filling pressures 3. Mildly elevated right heart pressures. 4.  Normal cardiac output  Plan: continue medical therapy. Diuresis. BP control.  2D Echo 01/17/20  1. Left ventricular ejection fraction, by estimation, is 50 to 55%. The  left ventricle has low normal function. The left ventricle demonstrates  regional wall motion abnormalities (see scoring diagram/findings for  description). There is moderate left  ventricular hypertrophy. Left ventricular diastolic parameters are  consistent with Grade I diastolic dysfunction (impaired relaxation).  Elevated left ventricular end-diastolic pressure. The E/e' is 74. There is  moderate hypokinesis of the left  ventricular, basal-mid inferior wall.  2. Right ventricular systolic function is hyperdynamic. The right  ventricular size is normal. There is normal pulmonary artery systolic  pressure.  3. Left atrial size was moderately dilated.  4. The mitral valve is abnormal. Trivial mitral valve regurgitation.  5. The aortic valve is tricuspid. Aortic valve regurgitation is not  visualized.  6. The inferior vena cava is normal in size with <50% respiratory  variability, suggesting right atrial pressure of 8 mmHg.   Comparison(s): No significant change from prior study. 01/16/20: LVEF  50-55%.   Patient Profile     86 y.o. female with CAD s/p DES to mLAD 01/2019 and recent inferior STEMI s/p DES to [facility] 12/2019, prior ICM with improved LVEF (50-55% EF by echo 12/2019), chronic combined CHF, HLD (statin intolerance), HLD and CKD III presented with few weeks hx of DOE, found to have NSTEMI with acut eon chronic systolic CHF. Hospital course complicated by AKI on CKD, iron deficiency anemia with heme positive stools, and new onset atrial fib RVR.  Assessment & Plan    1. Shortness of breath - presented with findings suspicious for NSTEMI - hsTn peaked at 1735.  - Underwent cardiac cath noted above with patent stents in the RCA and LAD>> Recommendation to continue medical therapy on ASA and Plavix.  -  Aspirin was stopped due to need for addition of Eliquis  - continue Plavix  - continueBB, Imdur, and low dose statin (statin intolerant in the past)  2. Acute on chronic combined CHF - last EF assessed 01/17/20 at 50-55%, but this was prior to this admission - repeat limited echo 02/17/2020 showed a decline in EF at 40-45% with HK of the basal to mid AS/IS and inferior myocardium and G2DD with elevated LV filling pressures - BNP >4000 with elevated filling pressures on cath. - Given IV lasix with little diuresis, and rise in Cr -> subsequently treated with IV fluids but then with increased LE edema and BNP 2867 (had been 4423 a week  ago) - Cxray with small B/L pleural effusions and atelectasis - She put out 750cc yesterday and is net neg 795cc since admit - weight down 3lbs from yesterday - O2 sats 98% on RA - nephrology consulted and feel likely combination of cardiorenal syndrome from afib with RVR/acute CHF and diuretics as well as contrast nephropathy from recent cath - IVF remain on hold - renal recommends no further diuretics for now until renal function improves and then start oral diuretics -SCr slowly improving and trending downward (3.26 >> 2.99) -continue fluid restriction>>Na slowly improving now up to 128 -continue TED hose stockings to help with LE edema -increase nutritional suppl due to low albumin that is likely contributing to LE edema as well -no ACE/ARB/ARNi/spiro due to AKI on CKD -continue Lopressor 25mg  BID, Hydralazine 50mg  TID and Imdur 30mg  daily  3. AKI on CKD III with worsening hyponatremia - baseline Cr quite variable recently anywhere from 1.3-1.9, admitted at 1.66 and peaked at 3.47 and slowly trending downward to 2.99 today - IVF stopped given increasing edema and decreasing sodium level - no obstruction on renal US - bladder scans with minimal residual per nurse - nephrology following and feels patient has contrast nephropathy with cardiorenal syndrome  from afib with RVR, CHF and diuretic use - diuretics on hold and continue fluid restriction for low Na - start PO diuretics once renal function improves  3.IDAnemia/ + Occult stools:10.2>>8.5>>7.9 - basliene hemoglobin appeared around 9-10 recently, nadir 7.9 this admission, with hemoglobin in the low-mid 8 range now - s/p IV iron x1, occult stool positive  - GI consulted with recommendations for conservative management - they were aware of dark stools/+FOBT but felt anemia was largely due to her chronic kidney disease - continue PPI - Eliquis has been restarted - continue daily CBC and appreciate nephro input on optimization as well>>Hbg stable at 9 today  4. New onset Afib RVR:  - briefly noted this admission on 02/15/20, converted to NSR quickly after use of IV amiodarone - had episode of atrial flutter with RVR  and given 150mg  IV bolus>>but then went into afib with RVR up to the 120's  - her nausea has completely resolved and now tolerating amiodarone to 400mg  BID and converted to sinus brady yesterday after a bolus of Amio 150mg  IV  - Eliquis initially stopped given anemia/GI bleeding issues, worsening renal function  - restarted on Eliquis 2.5mg  BID  after re ocurrence of atrial arrhythmias with aflutter.   -continue Eliquis 2.5mg  BID (SCr >1.5, age > 45)  5. HTN -BP improved and borderline controlled -continue Hydralazine, Imdur and BB -titrate Hydralazine as needed -would avoid increase in BB as she is bradycardic in sinus today  6. HLD - prior statin intolerance - pitavastatin and PCSK9 too expensive, agreeable to Crestor 2.5mg  every other day - lipid profile this admission with LDL 83, trig 74, HDL 56  I have spent a total of 35 minutes with patient reviewing 2D echo , telemetry, EKGs, labs and examining patient as well as establishing an assessment and plan that was discussed with the patient.  > 50% of time was spent in direct patient care.    For questions or  updates, please contact [place] Please consult [email or web address] for contact info under Cardiology/STEMI.  Signed, Fransico Him, MD 02/21/2020, 9:43 AM

## 2020-02-22 DIAGNOSIS — I214 Non-ST elevation (NSTEMI) myocardial infarction: Secondary | ICD-10-CM | POA: Diagnosis not present

## 2020-02-22 DIAGNOSIS — I480 Paroxysmal atrial fibrillation: Secondary | ICD-10-CM | POA: Diagnosis not present

## 2020-02-22 DIAGNOSIS — I5043 Acute on chronic combined systolic (congestive) and diastolic (congestive) heart failure: Secondary | ICD-10-CM | POA: Diagnosis not present

## 2020-02-22 DIAGNOSIS — I2510 Atherosclerotic heart disease of native coronary artery without angina pectoris: Secondary | ICD-10-CM | POA: Diagnosis not present

## 2020-02-22 LAB — BASIC METABOLIC PANEL
Anion gap: 11 (ref 5–15)
BUN: 51 mg/dL — ABNORMAL HIGH (ref 8–23)
CO2: 24 mmol/L (ref 22–32)
Calcium: 8.5 mg/dL — ABNORMAL LOW (ref 8.9–10.3)
Chloride: 94 mmol/L — ABNORMAL LOW (ref 98–111)
Creatinine, Ser: 2.51 mg/dL — ABNORMAL HIGH (ref 0.44–1.00)
GFR, Estimated: 19 mL/min — ABNORMAL LOW (ref >60)
Glucose, Bld: 111 mg/dL — ABNORMAL HIGH (ref 70–99)
Potassium: 4 mmol/L (ref 3.5–5.1)
Sodium: 129 mmol/L — ABNORMAL LOW (ref 135–145)

## 2020-02-22 LAB — CBC
HCT: 27.3 % — ABNORMAL LOW (ref 36.0–46.0)
Hemoglobin: 9.3 g/dL — ABNORMAL LOW (ref 12.0–15.0)
MCH: 28.3 pg (ref 26.0–34.0)
MCHC: 34.1 g/dL (ref 30.0–36.0)
MCV: 83 fL (ref 80.0–100.0)
Platelets: 270 10*3/uL (ref 150–400)
RBC: 3.29 MIL/uL — ABNORMAL LOW (ref 3.87–5.11)
RDW: 16.4 % — ABNORMAL HIGH (ref 11.5–15.5)
WBC: 7.2 10*3/uL (ref 4.0–10.5)
nRBC: 0 % (ref 0.0–0.2)

## 2020-02-22 LAB — GLUCOSE, CAPILLARY
Glucose-Capillary: 158 mg/dL — ABNORMAL HIGH (ref 70–99)
Glucose-Capillary: 120 mg/dL — ABNORMAL HIGH (ref 70–99)
Glucose-Capillary: 199 mg/dL — ABNORMAL HIGH (ref 70–99)
Glucose-Capillary: 140 mg/dL — ABNORMAL HIGH (ref 70–99)

## 2020-02-22 MED ORDER — HYDRALAZINE HCL 50 MG PO TABS
75.0000 mg | ORAL_TABLET | Freq: Three times a day (TID) | ORAL | Status: DC
  Administered 2020-02-22 – 2020-02-23 (×3): 75 mg via ORAL
  Filled 2020-02-22 (×5): qty 1

## 2020-02-22 NOTE — Progress Notes (Signed)
Physical Therapy Treatment Patient Details Name: Crystal [name] MRN: [id number] DOB: 1934-08-05 Today's Date: 02/22/2020    History of Present Illness Pt adm with NSTEMI and acute on chronic heart failure. PMH - CAD, PCI 12/2019, STEMI 01/2019, HTN, ckd, breast CA    PT Comments    Pt making steady progress. Eager for DC home.    Follow Up Recommendations  Home health PT;Supervision for mobility/OOB     Equipment Recommendations  Other (comment) (rollator)    Recommendations for Other Services       Precautions / Restrictions Precautions Precautions: Fall    Mobility  Bed Mobility               General bed mobility comments: Pt up on bsc  Transfers Overall transfer level: Needs assistance Equipment used: 4-wheeled walker Transfers: Sit to/from Stand Sit to Stand: Supervision         General transfer comment: Assist for safety  Ambulation/Gait Ambulation/Gait assistance: Min guard Gait Distance (Feet): 160 Feet Assistive device: 4-wheeled walker Gait Pattern/deviations: Step-through pattern;Decreased stride length Gait velocity: decr Gait velocity interpretation: <1.31 ft/sec, indicative of household ambulator General Gait Details: Assist for Therapist, music    Modified Rankin (Stroke Patients Only)       Balance Overall balance assessment: Needs assistance Sitting-balance support: No upper extremity supported;Feet supported Sitting balance-Leahy Scale: Good     Standing balance support: No upper extremity supported;During functional activity Standing balance-Leahy Scale: Fair                              Cognition Arousal/Alertness: Awake/alert Behavior During Therapy: Flat affect Overall Cognitive Status: Within Functional Limits for tasks assessed                                        Exercises      General Comments General comments (skin integrity,  edema, etc.): Amb on RA with dyspnea 3/4 and SpO2 96%      Pertinent Vitals/Pain      Home Living                      Prior Function            PT Goals (current goals can now be found in the care plan section) Acute Rehab PT Goals Patient Stated Goal: return home Potential to Achieve Goals: Good Progress towards PT goals: Progressing toward goals    Frequency    Min 3X/week      PT Plan Current plan remains appropriate    Co-evaluation              AM-PAC PT "6 Clicks" Mobility   Outcome Measure  Help needed turning from your back to your side while in a flat bed without using bedrails?: None Help needed moving from lying on your back to sitting on the side of a flat bed without using bedrails?: None Help needed moving to and from a bed to a chair (including a wheelchair)?: A Little Help needed standing up from a chair using your arms (e.g., wheelchair or bedside chair)?: None Help needed to walk in hospital room?: A Little Help needed climbing 3-5 steps with a railing? : A Little 6 Click  Score: 21    End of Session   Activity Tolerance: Patient limited by fatigue Patient left: with call bell/phone within reach;in chair;with chair alarm set Nurse Communication: Mobility status PT Visit Diagnosis: Unsteadiness on feet (R26.81);Other abnormalities of gait and mobility (R26.89);Muscle weakness (generalized) (M62.81)     Time: 4462-8638 PT Time Calculation (min) (ACUTE ONLY): 16 min  Charges:  $Gait Training: 8-22 mins                     [place] Pager [phone] Office [place] 02/22/2020, 10:01 AM

## 2020-02-22 NOTE — Progress Notes (Signed)
Progress Note  Patient Name: [name] Date of Encounter: 02/22/2020  Primary Cardiologist: Rozann Lesches, MD  Subjective   Feels much better in NSR and SOB and nausea have resolved  Inpatient Medications    Scheduled Meds: . amiodarone  150 mg Intravenous Once  . amiodarone  400 mg Oral BID  . apixaban  2.5 mg Oral BID  . calcium-vitamin D  1 tablet Oral QAC lunch  . cholecalciferol  1,000 Units Oral Q breakfast  . clopidogrel  75 mg Oral q morning - 10a  . ferrous sulfate  325 mg Oral QAC lunch  . hydrALAZINE  50 mg Oral Q8H  . isosorbide mononitrate  30 mg Oral QHS  . metoprolol tartrate  25 mg Oral BID  . pantoprazole  40 mg Oral Daily  . polycarbophil  625 mg Oral Daily  . rosuvastatin  2.5 mg Oral Q48H  . saccharomyces boulardii  250 mg Oral BID  . sodium chloride flush  3 mL Intravenous Q12H   Continuous Infusions: . sodium chloride     PRN Meds: sodium chloride, acetaminophen, alum & mag hydroxide-simeth, bisacodyl, diazepam, doxepin, magnesium hydroxide, nitroGLYCERIN, ondansetron (ZOFRAN) IV   Vital Signs    Vitals:   02/21/20 2216 02/21/20 2353 02/22/20 0459 02/22/20 0802  BP:  (!) 168/50 (!) 176/54 (!) 173/58  Pulse: (!) 58  (!) 55 63  Resp:   20   Temp:  97.7 F (36.5 C) 97.7 F (36.5 C)   TempSrc:   Oral   SpO2:   97%   Weight:   63.1 kg   Height:        Intake/Output Summary (Last 24 hours) at 02/22/2020 0809 Last data filed at 02/21/2020 2217 Gross per 24 hour  Intake 3 ml  Output 580 ml  Net -577 ml   Last 3 Weights 02/22/2020 02/21/2020 02/20/2020  Weight (lbs) 139 lb 1.8 oz 146 lb 14.4 oz 149 lb 6.4 oz  Weight (kg) 63.1 kg 66.633 kg 67.767 kg     Telemetry    NSR - Personally Reviewed  Physical Exam   GEN: Well nourished, well developed in no acute distress HEENT: Normal NECK: No JVD; No carotid bruits LYMPHATICS: No lymphadenopathy CARDIAC:RRR, no murmurs, rubs, gallops RESPIRATORY:  Clear to auscultation without  rales, wheezing or rhonchi  ABDOMEN: Soft, non-tender, non-distended MUSCULOSKELETAL:  No edema; No deformity  SKIN: Warm and dry NEUROLOGIC:  Alert and oriented x 3 PSYCHIATRIC:  Normal affect    Labs    High Sensitivity Troponin:   Recent Labs  Lab 02/12/20 1726 02/12/20 2300 02/13/20 1126 02/13/20 1330  TROPONINIHS 254* 1,735* 1,229* 1,055*      Cardiac EnzymesNo results for input(s): TROPONINI in the last 168 hours. No results for input(s): TROPIPOC in the last 168 hours.   Chemistry Recent Labs  Lab 02/18/20 0225 02/19/20 0231 02/20/20 0100 02/21/20 0252 02/22/20 0207  NA 127* 127* 126* 128* 129*  K 4.7 4.3 3.2* 3.5 4.0  CL 96* 96* 93* 93* 94*  CO2 19* 18* 21* 23 24  GLUCOSE 116* 98 113* 103* 111*  BUN 53* 56* 52* 54* 51*  CREATININE 3.47* 3.29* 3.26* 2.99* 2.51*  CALCIUM 8.4* 8.7* 8.4* 8.6* 8.5*  ALBUMIN 2.8* 3.6  --   --   --   GFRNONAA 13* 13* 14* 15* 19*  ANIONGAP 12 13 12 12 11      Hematology Recent Labs  Lab 02/20/20 0100 02/21/20 0252 02/22/20 0207  WBC 7.5  7.7 7.2  RBC 3.25* 3.27* 3.29*  HGB 8.9* 9.0* 9.3*  HCT 26.9* 27.4* 27.3*  MCV 82.8 83.8 83.0  MCH 27.4 27.5 28.3  MCHC 33.1 32.8 34.1  RDW 15.9* 16.1* 16.4*  PLT 242 261 270    BNP Recent Labs  Lab 02/18/20 0225  BNP 2,867.6*     DDimer No results for input(s): DDIMER in the last 168 hours.   Radiology    No results found.  Cardiac Studies   Cardiac Cath 02/13/20   Non-stenotic Mid LAD lesion was previously treated.  Prox Cx to Mid Cx lesion is 20% stenosed.  Dist RCA lesion is 20% stenosed.  Non-stenotic Mid RCA lesion was previously treated.  Hemodynamic findings consistent with mild pulmonary hypertension.  LV end diastolic pressure is mildly elevated.   1. Nonobstructive CAD. Continued excellent patency of stents in the LAD and RCA 2. Mildly elevated LV filling pressures 3. Mildly elevated right heart pressures. 4. Normal cardiac output  Plan: continue  medical therapy. Diuresis. BP control.  2D Echo 01/17/20  1. Left ventricular ejection fraction, by estimation, is 50 to 55%. The  left ventricle has low normal function. The left ventricle demonstrates  regional wall motion abnormalities (see scoring diagram/findings for  description). There is moderate left  ventricular hypertrophy. Left ventricular diastolic parameters are  consistent with Grade I diastolic dysfunction (impaired relaxation).  Elevated left ventricular end-diastolic pressure. The E/e' is 37. There is  moderate hypokinesis of the left  ventricular, basal-mid inferior wall.  2. Right ventricular systolic function is hyperdynamic. The right  ventricular size is normal. There is normal pulmonary artery systolic  pressure.  3. Left atrial size was moderately dilated.  4. The mitral valve is abnormal. Trivial mitral valve regurgitation.  5. The aortic valve is tricuspid. Aortic valve regurgitation is not  visualized.  6. The inferior vena cava is normal in size with <50% respiratory  variability, suggesting right atrial pressure of 8 mmHg.   Comparison(s): No significant change from prior study. 01/16/20: LVEF  50-55%.   Patient Profile     86 y.o. female with CAD s/p DES to mLAD 01/2019 and recent inferior STEMI s/p DES to [facility] 12/2019, prior ICM with improved LVEF (50-55% EF by echo 12/2019), chronic combined CHF, HLD (statin intolerance), HLD and CKD III presented with few weeks hx of DOE, found to have NSTEMI with acut eon chronic systolic CHF. Hospital course complicated by AKI on CKD, iron deficiency anemia with heme positive stools, and new onset atrial fib RVR.  Assessment & Plan    1. Shortness of breath - presented with findings suspicious for NSTEMI - hsTn peaked at 1735.  - Underwent cardiac cath noted above with patent stents in the RCA and LAD>> Recommendation to continue medical therapy on ASA and Plavix.  - Aspirin was stopped due to need for  addition of Eliquis  - continue Plavix  - continueBB, Imdur, and low dose statin (statin intolerant in the past)  2. Acute on chronic combined CHF - last EF assessed 01/17/20 at 50-55%, but this was prior to this admission - repeat limited echo 02/17/2020 showed a decline in EF at 40-45% with HK of the basal to mid AS/IS and inferior myocardium and G2DD with elevated LV filling pressures - BNP >4000 with elevated filling pressures on cath. - Given IV lasix with little diuresis, and rise in Cr -> subsequently treated with IV fluids but then with increased LE edema and BNP 2867 (had been  24 a week ago) - Cxray with small B/L pleural effusions and atelectasis - She put out 880cc yesterday and is net +480cc since admit - weight down 8lbs from yesterday and 9lbs from admit - O2 sats 97% on RA - nephrology consulted and feel likely combination of cardiorenal syndrome from afib with RVR/acute CHF and diuretics as well as contrast nephropathy from recent cath - IVF remain on hold - renal recommends no further diuretics for now until renal function improves and then start oral diuretics -SCr slowly improving and trending downward (3.26 >> 2.99 >>2.51) -continue fluid restriction>>Na slowly improving now up to 129 -continue TED hose stockings to help with LE edema -increase nutritional suppl due to low albumin that is likely contributing to LE edema as well -no ACE/ARB/ARNi/spiro due to AKI on CKD -continue Lopressor 25mg  BID and Imdur 30mg  daily -increase Hydralazine to 75mg  TID for better Bp control  3. AKI on CKD III with worsening hyponatremia - baseline Cr quite variable recently anywhere from 1.3-1.9, admitted at 1.66 and peaked at 3.47 and slowly trending downward to 2.15 today - IVF stopped given increasing edema and decreasing sodium level - no obstruction on renal US - bladder scans with minimal residual per nurse - nephrology following and feels patient has contrast nephropathy with  cardiorenal syndrome from afib with RVR, CHF and diuretic use - diuretics on hold and continue fluid restriction for low Na - start PO diuretics once renal function improves  3.IDAnemia/ + Occult stools:10.2>>8.5>>7.9 - basliene hemoglobin appeared around 9-10 recently, nadir 7.9 this admission, with hemoglobin now 9.3 today - s/p IV iron x1, occult stool positive  - GI consulted with recommendations for conservative management - they were aware of dark stools/+FOBT but felt anemia was largely due to her chronic kidney disease - continue PPI - Eliquis has been restarted - continue daily CBC and appreciate nephro input on optimization as well>>Hbg trending upward today at 9.3  4. New onset Afib RVR:  - briefly noted this admission on 02/15/20, converted to NSR quickly after use of IV amiodarone - had episode of atrial flutter with RVR  and given 150mg  IV bolus>>but then went into afib with RVR up to the 120's  - her nausea has completely resolved and now tolerating amiodarone to 400mg  BID and converted to sinus brady after a bolus of Amio 150mg  IV  - Eliquis initially stopped given anemia/GI bleeding issues, worsening renal function  - restarted on Eliquis 2.5mg  BID  after re ocurrence of atrial arrhythmias with aflutter.   -continue Eliquis 2.5mg  BID (SCr >1.5, age > 27) and Amio 400mg  BID for 1 week and then 400mg  daily for 2 weeks and then 200mg  daily maintenance  5. HTN -BP remains elevated -continue Imdur and BB -cannot increase BB further due to bradycardia -increase Hydralazine to 75mg  TID  6. HLD - prior statin intolerance - pitavastatin and PCSK9 too expensive, agreeable to Crestor 2.5mg  every other day - lipid profile this admission with LDL 83, trig 74, HDL 56  I have spent a total of 35 minutes with patient reviewing 2D echo , telemetry, EKGs, labs and examining patient as well as establishing an assessment and plan that was discussed with the patient.  > 50% of time was  spent in direct patient care.    For questions or updates, please contact [place] Please consult [email or web address] for contact info under Cardiology/STEMI.  Signed, Fransico Him, MD 02/22/2020, 8:09 AM

## 2020-02-22 NOTE — TOC Progression Note (Signed)
Transition of Care [facility]) - Progression Note    Patient Details  Name: Crystal [name] MRN: [id number] Date of Birth: 10-04-1934  Transition of Care [facility]) CM/SW Contact  Graves-Bigelow, Ocie Cornfield, RN Phone Number: 02/22/2020, 4:35 PM  Clinical Narrative:  Case Manager spoke with patient regarding durable medical equipment rollator. Discussed if insurance was to pay for rollator and she needed a wheelchair within five years, insurance will not pay for the wheelchair. Patient opted to pay for the rollator out of pocket from a medical supply store. Patient will purchase after leaving the hospital.    Expected Discharge Plan: [place] Barriers to Discharge: Continued Medical Work up  Expected Discharge Plan and Services Expected Discharge Plan: [place] In-house Referral: NA Discharge Planning Services: CM Consult Post Acute Care Choice: [place] arrangements for the past 2 months: Single Family Home                 DME Arranged: N/A         HH Arranged: PT HH Agency: [place] Date [place]: 02/20/20 Time [place]: [id number] Representative spoke with at [place]: Adela Lank  Readmission Risk Interventions No flowsheet data found.

## 2020-02-22 NOTE — Progress Notes (Signed)
[facility] NEPHROLOGY PROGRESS NOTE  Assessment/ Plan: Pt is a 86 y.o. yo female  with history of hypertension, HLD, CKD 3 with baseline creatinine level around 1.3-1.9, chronic combined CHF, CAD who was admitted on 1/17 for acute CHF exacerbation and an NSTEMI, seen as a consultation for acute kidney injury on CKD.  #Acute kidney injury on CKD stage IIIb/IV: Likely due to contrast nephropathy in association with hemodynamic changes related with A. fib with RVR, CHF/cardiorenal syndrome. UA with minimal protein but has bacteria, no RBC. Reportedly bladder scan with no urinary retention.  The kidney ultrasound ruled out obstruction.   Creatinine is now slowly improving from peak of 3.5 on 1/22 to 2.5 today. Hopeful for ongoing recovery.  Avoid nephrotoxins or hypotensive episode. No need for dialysis.  Will arrange outpt nephrology follow up in clinic.   #Acute CHF exacerbation: Has been diuresed - net neg 1L per I/Os, weights are quite variable.  Volume status looks ok today.  Would favor holding diuretics still.  Her prior dose was lasix 20 daily.   #Hyponatremia, hypervolemic: Worsened with IV fluid and after stopping loop diuretics but then again worse after diuresis.  Cont na and fluid restriction. Now improved after holding diuretics yesterday with pt ~ slightly net neg over past 24h.    # NSTEMI: Status post cardiac cath with stent placement in RCA and LAD.  On Plavix.  No Eliquis or aspirin due to GI bleed initially but has been resumed now with new a fib.  #Anemia with stool occult blood positive:  Anticoag was held but resumed at lower dose 1/23.   Treated with IV iron.  Monitor hemoglobin, currently stable in 9s.  #A. fib with RVR: On amiodarone and metoprolol with cardiology managing.  Low dose eliquis added.   #Hypertension: Blood pressure high and cardiology has been adjusting meds.   Dispo:  I think discharge soon is fine -- her renal function and serum  sodium are improved.  Prior home dose of lasix 20 daily - she may need more but she's quite sensitive and I think a trial of that dose with daily weights prompting dose adjustment along with close follow up in cardiology would be the most prudent approach.   I will sign off and plan to see her in clinic in the next 4 weeks for ongoing CKD care.  Reach out if I can help in the meantime.  Subjective: Seen and examined. Feeling better and asking about discharge home.  No uremic symptoms.   Objective Vital signs in last 24 hours: Vitals:   02/21/20 2216 02/21/20 2353 02/22/20 0459 02/22/20 0802  BP:  (!) 168/50 (!) 176/54 (!) 173/58  Pulse: (!) 58  (!) 55 63  Resp:   20   Temp:  97.7 F (36.5 C) 97.7 F (36.5 C)   TempSrc:   Oral   SpO2:   97%   Weight:   63.1 kg   Height:       Weight change:   Intake/Output Summary (Last 24 hours) at 02/22/2020 0920 Last data filed at 02/22/2020 0800 Gross per 24 hour  Intake 483 ml  Output 580 ml  Net -97 ml       Labs: Basic Metabolic Panel: Recent Labs  Lab 02/20/20 0100 02/21/20 0252 02/22/20 0207  NA 126* 128* 129*  K 3.2* 3.5 4.0  CL 93* 93* 94*  CO2 21* 23 24  GLUCOSE 113* 103* 111*  BUN 52* 54* 51*  CREATININE 3.26*  2.99* 2.51*  CALCIUM 8.4* 8.6* 8.5*   Liver Function Tests: Recent Labs  Lab 02/18/20 0225 02/19/20 0231  ALBUMIN 2.8* 3.6   No results for input(s): LIPASE, AMYLASE in the last 168 hours. No results for input(s): AMMONIA in the last 168 hours. CBC: Recent Labs  Lab 02/18/20 0225 02/19/20 0231 02/20/20 0100 02/21/20 0252 02/22/20 0207  WBC 7.7 7.9 7.5 7.7 7.2  HGB 8.4* 8.5* 8.9* 9.0* 9.3*  HCT 24.4* 26.1* 26.9* 27.4* 27.3*  MCV 83.3 84.5 82.8 83.8 83.0  PLT 230 217 242 261 270   Cardiac Enzymes: No results for input(s): CKTOTAL, CKMB, CKMBINDEX, TROPONINI in the last 168 hours. CBG: Recent Labs  Lab 02/21/20 1214 02/21/20 1532 02/21/20 1637 02/21/20 2128 02/22/20 0751  GLUCAP 188* 118*  106* 120* 158*    Iron Studies: No results for input(s): IRON, TIBC, TRANSFERRIN, FERRITIN in the last 72 hours. Studies/Results: No results found.  Medications: Infusions: . sodium chloride      Scheduled Medications: . amiodarone  150 mg Intravenous Once  . amiodarone  400 mg Oral BID  . apixaban  2.5 mg Oral BID  . calcium-vitamin D  1 tablet Oral QAC lunch  . cholecalciferol  1,000 Units Oral Q breakfast  . clopidogrel  75 mg Oral q morning - 10a  . ferrous sulfate  325 mg Oral QAC lunch  . hydrALAZINE  75 mg Oral Q8H  . isosorbide mononitrate  30 mg Oral QHS  . metoprolol tartrate  25 mg Oral BID  . pantoprazole  40 mg Oral Daily  . polycarbophil  625 mg Oral Daily  . rosuvastatin  2.5 mg Oral Q48H  . saccharomyces boulardii  250 mg Oral BID  . sodium chloride flush  3 mL Intravenous Q12H    have reviewed scheduled and prn medications.  Physical Exam: General:NAD, comfortable Heart:sinus brady in high 50s, s1s2 nl Lungs: clear ant Abdomen:soft, Non-tender, non-distended Extremities: trace ankle edema Neurology: Alert awake and following commands, no tremor  Ria Comment A Kruska 02/22/2020,9:20 AM  LOS: 9 days

## 2020-02-23 ENCOUNTER — Inpatient Hospital Stay (HOSPITAL_COMMUNITY): Payer: Medicare Other

## 2020-02-23 DIAGNOSIS — J810 Acute pulmonary edema: Secondary | ICD-10-CM

## 2020-02-23 DIAGNOSIS — I480 Paroxysmal atrial fibrillation: Secondary | ICD-10-CM | POA: Diagnosis not present

## 2020-02-23 DIAGNOSIS — N179 Acute kidney failure, unspecified: Secondary | ICD-10-CM | POA: Diagnosis not present

## 2020-02-23 DIAGNOSIS — I160 Hypertensive urgency: Secondary | ICD-10-CM

## 2020-02-23 DIAGNOSIS — R0602 Shortness of breath: Secondary | ICD-10-CM

## 2020-02-23 DIAGNOSIS — E7800 Pure hypercholesterolemia, unspecified: Secondary | ICD-10-CM | POA: Diagnosis not present

## 2020-02-23 DIAGNOSIS — I5043 Acute on chronic combined systolic (congestive) and diastolic (congestive) heart failure: Secondary | ICD-10-CM | POA: Diagnosis not present

## 2020-02-23 DIAGNOSIS — J90 Pleural effusion, not elsewhere classified: Secondary | ICD-10-CM

## 2020-02-23 DIAGNOSIS — N189 Chronic kidney disease, unspecified: Secondary | ICD-10-CM

## 2020-02-23 DIAGNOSIS — I214 Non-ST elevation (NSTEMI) myocardial infarction: Secondary | ICD-10-CM | POA: Diagnosis not present

## 2020-02-23 LAB — CBC
HCT: 28.8 % — ABNORMAL LOW (ref 36.0–46.0)
Hemoglobin: 9.4 g/dL — ABNORMAL LOW (ref 12.0–15.0)
MCH: 27.4 pg (ref 26.0–34.0)
MCHC: 32.6 g/dL (ref 30.0–36.0)
MCV: 84 fL (ref 80.0–100.0)
Platelets: 275 10*3/uL (ref 150–400)
RBC: 3.43 MIL/uL — ABNORMAL LOW (ref 3.87–5.11)
RDW: 16.4 % — ABNORMAL HIGH (ref 11.5–15.5)
WBC: 6.5 10*3/uL (ref 4.0–10.5)
nRBC: 0 % (ref 0.0–0.2)

## 2020-02-23 LAB — BASIC METABOLIC PANEL
Anion gap: 9 (ref 5–15)
BUN: 43 mg/dL — ABNORMAL HIGH (ref 8–23)
CO2: 24 mmol/L (ref 22–32)
Calcium: 8.6 mg/dL — ABNORMAL LOW (ref 8.9–10.3)
Chloride: 96 mmol/L — ABNORMAL LOW (ref 98–111)
Creatinine, Ser: 2.16 mg/dL — ABNORMAL HIGH (ref 0.44–1.00)
GFR, Estimated: 22 mL/min — ABNORMAL LOW (ref >60)
Glucose, Bld: 121 mg/dL — ABNORMAL HIGH (ref 70–99)
Potassium: 3.9 mmol/L (ref 3.5–5.1)
Sodium: 129 mmol/L — ABNORMAL LOW (ref 135–145)

## 2020-02-23 LAB — GLUCOSE, CAPILLARY
Glucose-Capillary: 155 mg/dL — ABNORMAL HIGH (ref 70–99)
Glucose-Capillary: 159 mg/dL — ABNORMAL HIGH (ref 70–99)
Glucose-Capillary: 116 mg/dL — ABNORMAL HIGH (ref 70–99)

## 2020-02-23 LAB — BRAIN NATRIURETIC PEPTIDE: B Natriuretic Peptide: 3507.5 pg/mL — ABNORMAL HIGH (ref 0.0–100.0)

## 2020-02-23 LAB — MRSA PCR SCREENING: MRSA by PCR: NEGATIVE

## 2020-02-23 MED ORDER — AMIODARONE IV BOLUS ONLY 150 MG/100ML
150.0000 mg | Freq: Once | INTRAVENOUS | Status: AC
  Administered 2020-02-23: 150 mg via INTRAVENOUS
  Filled 2020-02-23: qty 100

## 2020-02-23 MED ORDER — CHLORHEXIDINE GLUCONATE CLOTH 2 % EX PADS
6.0000 | MEDICATED_PAD | Freq: Every day | CUTANEOUS | Status: DC
  Administered 2020-02-23 – 2020-03-01 (×8): 6 via TOPICAL

## 2020-02-23 MED ORDER — ALPRAZOLAM 0.25 MG PO TABS
0.2500 mg | ORAL_TABLET | Freq: Once | ORAL | Status: AC
  Administered 2020-02-23: 0.25 mg via ORAL
  Filled 2020-02-23: qty 1

## 2020-02-23 MED ORDER — NITROGLYCERIN 2 % TD OINT
1.0000 [in_us] | TOPICAL_OINTMENT | Freq: Once | TRANSDERMAL | Status: DC
  Filled 2020-02-23: qty 30

## 2020-02-23 MED ORDER — AMIODARONE LOAD VIA INFUSION: 150.0000 mg | Freq: Once | INTRAVENOUS | Status: DC

## 2020-02-23 MED ORDER — AMIODARONE HCL 200 MG PO TABS
400.0000 mg | ORAL_TABLET | Freq: Two times a day (BID) | ORAL | Status: DC
  Administered 2020-02-23 – 2020-02-28 (×11): 400 mg via ORAL
  Filled 2020-02-23 (×11): qty 2

## 2020-02-23 MED ORDER — FUROSEMIDE 10 MG/ML IJ SOLN
40.0000 mg | Freq: Once | INTRAMUSCULAR | Status: AC
  Administered 2020-02-23: 40 mg via INTRAVENOUS

## 2020-02-23 MED ORDER — LORAZEPAM 2 MG/ML IJ SOLN
INTRAMUSCULAR | Status: AC
  Administered 2020-02-23: 2 mg
  Filled 2020-02-23: qty 1

## 2020-02-23 MED ORDER — FUROSEMIDE 10 MG/ML IJ SOLN
INTRAMUSCULAR | Status: AC
  Filled 2020-02-23: qty 4

## 2020-02-23 MED ORDER — HYDRALAZINE HCL 50 MG PO TABS
100.0000 mg | ORAL_TABLET | Freq: Three times a day (TID) | ORAL | Status: DC
  Administered 2020-02-23 – 2020-03-04 (×30): 100 mg via ORAL
  Filled 2020-02-23 (×30): qty 2

## 2020-02-23 MED ORDER — POTASSIUM CHLORIDE CRYS ER 20 MEQ PO TBCR
40.0000 meq | EXTENDED_RELEASE_TABLET | Freq: Once | ORAL | Status: AC
  Administered 2020-02-23: 40 meq via ORAL
  Filled 2020-02-23: qty 2

## 2020-02-23 MED ORDER — CARVEDILOL 6.25 MG PO TABS
6.2500 mg | ORAL_TABLET | Freq: Two times a day (BID) | ORAL | Status: DC
  Administered 2020-02-23 – 2020-02-24 (×4): 6.25 mg via ORAL
  Filled 2020-02-23 (×4): qty 1

## 2020-02-23 MED ORDER — ISOSORBIDE MONONITRATE ER 60 MG PO TB24
60.0000 mg | ORAL_TABLET | Freq: Every day | ORAL | Status: DC
  Filled 2020-02-23: qty 1

## 2020-02-23 MED ORDER — HYDRALAZINE HCL 25 MG PO TABS
25.0000 mg | ORAL_TABLET | Freq: Once | ORAL | Status: AC
  Administered 2020-02-23: 25 mg via ORAL
  Filled 2020-02-23: qty 1

## 2020-02-23 MED ORDER — MAGNESIUM SULFATE 2 GM/50ML IV SOLN
2.0000 g | Freq: Once | INTRAVENOUS | Status: AC
  Administered 2020-02-23: 2 g via INTRAVENOUS
  Filled 2020-02-23: qty 50

## 2020-02-23 MED ORDER — FUROSEMIDE 20 MG PO TABS
20.0000 mg | ORAL_TABLET | Freq: Once | ORAL | Status: AC
  Administered 2020-02-23: 20 mg via ORAL
  Filled 2020-02-23: qty 1

## 2020-02-23 NOTE — Progress Notes (Addendum)
Patient went back into afib with RVR this am in the 130's.  Will  give 150mg  bolus and continue PO for now and see if she converts

## 2020-02-23 NOTE — Procedures (Signed)
Thoracentesis  Procedure Note  CHARLET HARR  [id number]  [birth year]  Date:02/23/20  Time:11:09 AM   Provider Performing:Pegeen Stiger C Tamala Julian   Procedure: Thoracentesis with imaging guidance (92010)  Indication(s) Pleural Effusion  Consent Risks of the procedure as well as the alternatives and risks of each were explained to the patient and/or caregiver.  Consent for the procedure was obtained and is signed in the bedside chart  Anesthesia Topical only with 1% lidocaine    Time Out Verified patient identification, verified procedure, site/side was marked, verified correct patient position, special equipment/implants available, medications/allergies/relevant history reviewed, required imaging and test results available.   Sterile Technique Maximal sterile technique including full sterile barrier drape, hand hygiene, sterile gown, sterile gloves, mask, hair covering, sterile ultrasound probe cover (if used).  Procedure Description Ultrasound was used to identify appropriate pleural anatomy for placement and overlying skin marked.  Area of drainage cleaned and draped in sterile fashion. Lidocaine was used to anesthetize the skin and subcutaneous tissue.  800 cc's of straw appearing fluid was drained from the right pleural space. Catheter then removed and bandaid applied to site.   Complications/Tolerance None; patient tolerated the procedure well. Chest X-ray is ordered to confirm no post-procedural complication.   EBL Minimal   Specimen(s) None

## 2020-02-23 NOTE — Progress Notes (Signed)
Amiodarone Drug - Drug Interaction Consult Note  Recommendations: - Monitor for muscle pain or weakness, bradycardia, and hypokalemia - Monitor for signs and symptoms of bleeding  Amiodarone is metabolized by the cytochrome P450 system and therefore has the potential to cause many drug interactions. Amiodarone has an average plasma half-life of 50 days (range 20 to 100 days).   There is potential for drug interactions to occur several weeks or months after stopping treatment and the onset of drug interactions may be slow after initiating amiodarone.   [x]  Statins: Increased risk of myopathy. Simvastatin- restrict dose to 20mg  daily. Other statins: counsel patients to report any muscle pain or weakness immediately.  [x]  Anticoagulants: Amiodarone can increase anticoagulant effect. Consider warfarin dose reduction. Patients should be monitored closely and the dose of anticoagulant altered accordingly, remembering that amiodarone levels take several weeks to stabilize.  []  Antiepileptics: Amiodarone can increase plasma concentration of phenytoin, the dose should be reduced. Note that small changes in phenytoin dose can result in large changes in levels. Monitor patient and counsel on signs of toxicity.  [x]  Beta blockers: increased risk of bradycardia, AV block and myocardial depression. Sotalol - avoid concomitant use.  []   Calcium channel blockers (diltiazem and verapamil): increased risk of bradycardia, AV block and myocardial depression.  []   Cyclosporine: Amiodarone increases levels of cyclosporine. Reduced dose of cyclosporine is recommended.  []  Digoxin dose should be halved when amiodarone is started.  [x]  Diuretics: increased risk of cardiotoxicity if hypokalemia occurs.  []  Oral hypoglycemic agents (glyburide, glipizide, glimepiride): increased risk of hypoglycemia. Patient's glucose levels should be monitored closely when initiating amiodarone therapy.   []  Drugs that prolong the  QT interval:  Torsades de pointes risk may be increased with concurrent use - avoid if possible.  Monitor QTc, also keep magnesium/potassium WNL if concurrent therapy can't be avoided. Marland Kitchen Antibiotics: e.g. fluoroquinolones, erythromycin. . Antiarrhythmics: e.g. quinidine, procainamide, disopyramide, sotalol. . Antipsychotics: e.g. phenothiazines, haloperidol.  . Lithium, tricyclic antidepressants, and methadone.  Thank You,    Shauna Hugh, PharmD, [place]  PGY-1 Pharmacy Resident 02/23/2020 11:28 AM  Please check [email or web address] for unit-specific pharmacy phone numbers.

## 2020-02-23 NOTE — Progress Notes (Signed)
[facility] NE[facility] PROGRESS NOTE  Assessment/ Plan: Pt is a 86 y.o. yo female  with history of hypertension, HLD, CKD 3 with baseline creatinine level around 1.3-1.9, chronic combined CHF, CAD who was admitted on 1/17 for acute CHF exacerbation and an NSTEMI, seen as a consultation for acute kidney injury on CKD.  #Acute kidney injury on CKD stage IIIb/IV: Likely due to contrast nephropathy in association with hemodynamic changes related with A. fib with RVR, CHF/cardiorenal syndrome. UA with minimal protein but has bacteria, no RBC. Reportedly bladder scan with no urinary retention.  The kidney ultrasound ruled out obstruction.   Creatinine is now improved from peak of 3.5 on 1/22 to 2.1 today. Hopeful for ongoing recovery.  Avoid nephrotoxins or hypotensive episode. No need for dialysis.  Will arrange outpt nephrology follow up in clinic.   #Acute CHF exacerbation: She was diuresed, worsened, got fluids back.  The past few days she looked fairly euvolemic but today's sequence of events require IV diuresis - she's been given 40IV this AM and I think we can follow for response.  BP was certainly playing a role in today's events too.  #Hyponatremia, hypervolemic: Worsened with IV fluid and after stopping loop diuretics but then again worse after diuresis.  Cont na and fluid restriction. Now improved after holding diuretics x2 days but in light of pulmonary edema will resume diuretics.  # NSTEMI: Status post cardiac cath with stent placement in RCA and LAD.  On Plavix.  No Eliquis or aspirin due to GI bleed initially but has been resumed now with new a fib.  #Anemia with stool occult blood positive:  Anticoag was held but resumed at lower dose 1/23.   Treated with IV iron.  Monitor hemoglobin, currently stable in 9s.  #A. fib with RVR: On amiodarone and metoprolol with cardiology managing.  Low dose eliquis added.   #Hypertension: Blood pressure high and cardiology has been  adjusting meds; imdur added today.  I think we should check for RAS after today's events and propensity for AKI.    Will follow - page with issues.     Subjective:  Pt developed HTN 220s, respiratory distress with pulmonary edema + effusions on CXR this AM.  Then A fib with RVR poss VT (but likely A fib with aberrancy).  Required transfer to ICU, amiodarone load.  Underwent bilateral large vol thoracentesis.  When I went by to see her she was sleepy after benzos for thoracentesis.  Off bipap with good sats on [place].   Objective Vital signs in last 24 hours: Vitals:   02/23/20 0500 02/23/20 0753 02/23/20 0819 02/23/20 0915  BP:  (!) 217/70 (!) 224/76 (!) 196/103  Pulse:  74 79 (!) 114  Resp:  (!) 30  (!) 23  Temp:  99 F (37.2 C) (!) 97.5 F (36.4 C) 98.2 F (36.8 C)  TempSrc:  Oral Oral   SpO2:  96%  91%  Weight: 67.2 kg     Height:       Weight change: 0.544 kg  Intake/Output Summary (Last 24 hours) at 02/23/2020 1302 Last data filed at 02/22/2020 2000 Gross per 24 hour  Intake 960 ml  Output 300 ml  Net 660 ml       Labs: Basic Metabolic Panel: Recent Labs  Lab 02/21/20 0252 02/22/20 0207 02/23/20 0350  NA 128* 129* 129*  K 3.5 4.0 3.9  CL 93* 94* 96*  CO2 23 24 24   GLUCOSE 103* 111* 121*  BUN 54* 51* 43*  CREATININE 2.99* 2.51* 2.16*  CALCIUM 8.6* 8.5* 8.6*   Liver Function Tests: Recent Labs  Lab 02/18/20 0225 02/19/20 0231  ALBUMIN 2.8* 3.6   No results for input(s): LIPASE, AMYLASE in the last 168 hours. No results for input(s): AMMONIA in the last 168 hours. CBC: Recent Labs  Lab 02/19/20 0231 02/20/20 0100 02/21/20 0252 02/22/20 0207 02/23/20 0350  WBC 7.9 7.5 7.7 7.2 6.5  HGB 8.5* 8.9* 9.0* 9.3* 9.4*  HCT 26.1* 26.9* 27.4* 27.3* 28.8*  MCV 84.5 82.8 83.8 83.0 84.0  PLT 217 242 261 270 275   Cardiac Enzymes: No results for input(s): CKTOTAL, CKMB, CKMBINDEX, TROPONINI in the last 168 hours. CBG: Recent Labs  Lab 02/22/20 0751  02/22/20 1131 02/22/20 1711 02/22/20 2212 02/23/20 0750  GLUCAP 158* 120* 199* 140* 155*    Iron Studies: No results for input(s): IRON, TIBC, TRANSFERRIN, FERRITIN in the last 72 hours. Studies/Results: DG Chest 1 View  Result Date: 02/23/2020 CLINICAL DATA:  Status post thoracentesis. EXAM: CHEST  1 VIEW COMPARISON:  02/23/2020 FINDINGS: Heart size is within normal limits. Mild pulmonary vascular congestion. Blunted left lateral costophrenic angle likely due to small pleural effusion. Exam is limited due to exclusion of the lung apices and multiple overlying support structures. IMPRESSION: 1. Interval decrease of bilateral pleural effusions. Small effusion limb aimed on the left. 2. Exam is limited due to multiple overlying support structures as well as exclusion of the lung apices. Very small apical pneumothorax is not excluded. Electronically Signed   By: Miachel Roux M.D.   On: 02/23/2020 11:36   DG CHEST PORT 1 VIEW  Result Date: 02/23/2020 CLINICAL DATA:  Worsening shortness of breath over the last 2 days. EXAM: PORTABLE CHEST 1 VIEW COMPARISON:  02/17/2020 FINDINGS: Worsened congestive heart failure pattern. Cardiomegaly. Aortic atherosclerosis. Pulmonary venous hypertension. Interstitial pulmonary edema. Enlarging bilateral effusions with associated lower lobe atelectasis. IMPRESSION: Worsening congestive heart failure pattern. Enlarging bilateral effusions with associated lower lobe atelectasis. Electronically Signed   By: Nelson Chimes M.D.   On: 02/23/2020 08:39    Medications: Infusions: . sodium chloride    . magnesium sulfate bolus IVPB 2 g (02/23/20 1207)    Scheduled Medications: . amiodarone  400 mg Oral BID  . apixaban  2.5 mg Oral BID  . calcium-vitamin D  1 tablet Oral QAC lunch  . carvedilol  6.25 mg Oral BID WC  . Chlorhexidine Gluconate Cloth  6 each Topical Daily  . cholecalciferol  1,000 Units Oral Q breakfast  . clopidogrel  75 mg Oral q morning - 10a  .  ferrous sulfate  325 mg Oral QAC lunch  . hydrALAZINE  100 mg Oral Q8H  . isosorbide mononitrate  60 mg Oral QHS  . pantoprazole  40 mg Oral Daily  . polycarbophil  625 mg Oral Daily  . rosuvastatin  2.5 mg Oral Q48H  . saccharomyces boulardii  250 mg Oral BID  . sodium chloride flush  3 mL Intravenous Q12H    have reviewed scheduled and prn medications.  Physical Exam: General:sleeping deeply Heart: SR on monitor in 90s Lungs: clear ant, normal WOB Abdomen:soft, Non-tender, non-distended Extremities: trace ankle edema Neurology: sleeping  Justin Mend 02/23/2020,1:02 PM  LOS: 10 days

## 2020-02-23 NOTE — Progress Notes (Signed)
   02/23/20 0753  Assess: MEWS Score  Temp 99 F (37.2 C)  BP (!) 217/70  Pulse Rate 74  ECG Heart Rate 74  Resp (!) 30  SpO2 96 %  Assess: MEWS Score  MEWS Temp 0  MEWS Systolic 2  MEWS Pulse 0  MEWS RR 2  MEWS LOC 0  MEWS Score 4  MEWS Score Color Red  Assess: if the MEWS score is Yellow or Red  Were vital signs taken at a resting state? Yes  Focused Assessment Change from prior assessment (see assessment flowsheet)  Early Detection of Sepsis Score *See Row Information* Medium  MEWS guidelines implemented *See Row Information* Yes  Treat  Pain Scale 0-10  Pain Score 0  Take Vital Signs  Increase Vital Sign Frequency  Red: Q 1hr X 4 then Q 4hr X 4, if remains red, continue Q 4hrs  Escalate  MEWS: Escalate Red: discuss with charge nurse/RN and provider, consider discussing with RRT  Notify: Charge Nurse/RN  Name of Charge Nurse/RN Notified Jessica, RN  Date Charge Nurse/RN Notified 02/23/20  Time Charge Nurse/RN Notified 0800  Notify: Provider  Provider Name/Title Radford Pax, MD  Date Provider Notified 02/23/20  Time Provider Notified 0800  Notification Type Face-to-face  Response See new orders  Date of Provider Response 02/23/20  Time of Provider Response 0801  Notify: Rapid Response  Name of Rapid Response RN Notified Kelli Churn, RN  Date Rapid Response Notified 02/23/20  Time Rapid Response Notified 0900  Document  Patient Outcome Transferred/level of care increased  Progress note created (see row info) Yes

## 2020-02-23 NOTE — Progress Notes (Signed)
Progress Note  Patient Name: Crystal [name] Date of Encounter: 02/23/2020  [facility] Cardiologist: Rozann Lesches, MD   Subjective   No acute overnight events. BP is markedly elevated this morning with systolic BP of 982. She feels like the Hydralazine is causing her to have worsening lower extremity edema. She also feels like her breathing is worsening again - she now feels like it is back to where it was when she was admitted. No chest pain or palpitations. No recurrent nausea.  Inpatient Medications    Scheduled Meds: . amiodarone  150 mg Intravenous Once  . amiodarone  400 mg Oral BID  . apixaban  2.5 mg Oral BID  . calcium-vitamin D  1 tablet Oral QAC lunch  . cholecalciferol  1,000 Units Oral Q breakfast  . clopidogrel  75 mg Oral q morning - 10a  . ferrous sulfate  325 mg Oral QAC lunch  . hydrALAZINE  75 mg Oral Q8H  . isosorbide mononitrate  30 mg Oral QHS  . metoprolol tartrate  25 mg Oral BID  . pantoprazole  40 mg Oral Daily  . polycarbophil  625 mg Oral Daily  . rosuvastatin  2.5 mg Oral Q48H  . saccharomyces boulardii  250 mg Oral BID  . sodium chloride flush  3 mL Intravenous Q12H   Continuous Infusions: . sodium chloride     PRN Meds: sodium chloride, acetaminophen, alum & mag hydroxide-simeth, bisacodyl, diazepam, doxepin, magnesium hydroxide, nitroGLYCERIN, ondansetron (ZOFRAN) IV   Vital Signs    Vitals:   02/22/20 2216 02/23/20 0014 02/23/20 0440 02/23/20 0500  BP: (!) 176/58 (!) 176/59 (!) 199/63   Pulse: 65 61 62   Resp: (!) 22 (!) 22 20   Temp:  98.8 F (37.1 C) 98.3 F (36.8 C)   TempSrc:  Oral Oral   SpO2: 94% 96% 98%   Weight:    67.2 kg  Height:        Intake/Output Summary (Last 24 hours) at 02/23/2020 0704 Last data filed at 02/22/2020 2000 Gross per 24 hour  Intake 1440 ml  Output 300 ml  Net 1140 ml   Last 3 Weights 02/23/2020 02/22/2020 02/21/2020  Weight (lbs) 148 lb 1.6 oz 139 lb 1.8 oz 146 lb 14.4 oz  Weight (kg)  67.178 kg 63.1 kg 66.633 kg      Telemetry    Sinus rhythm with rates in the 50's to 60's. - Personally Reviewed  ECG    No new ECG tracing today. - Personally Reviewed  Physical Exam   GEN: No acute distress.   Neck: JVD elevated. Cardiac: RRR. No murmurs, rubs, or gallops. Radial and distal pedal pulses 2+ and equal bilaterally. Respiratory: Mild increased work of breathing (may be due to anxiety). Decreased breath sounds in bilateral bases with faint crackles. GI: Soft, non-distended, and non-tender. Bowel sounds present. MS: Trace ankle/pedal edema bilaterally. No deformity. Skin: Warm and dry. Neuro:  No focal deficits. Psych: Normal affect.   Labs    High Sensitivity Troponin:   Recent Labs  Lab 02/12/20 1726 02/12/20 2300 02/13/20 1126 02/13/20 1330  TROPONINIHS 254* 1,735* 1,229* 1,055*      Chemistry Recent Labs  Lab 02/18/20 0225 02/19/20 0231 02/20/20 0100 02/21/20 0252 02/22/20 0207 02/23/20 0350  NA 127* 127*   < > 128* 129* 129*  K 4.7 4.3   < > 3.5 4.0 3.9  CL 96* 96*   < > 93* 94* 96*  CO2 19* 18*   < >  23 24 24   GLUCOSE 116* 98   < > 103* 111* 121*  BUN 53* 56*   < > 54* 51* 43*  CREATININE 3.47* 3.29*   < > 2.99* 2.51* 2.16*  CALCIUM 8.4* 8.7*   < > 8.6* 8.5* 8.6*  ALBUMIN 2.8* 3.6  --   --   --   --   GFRNONAA 13* 13*   < > 15* 19* 22*  ANIONGAP 12 13   < > 12 11 9    < > = values in this interval not displayed.     Hematology Recent Labs  Lab 02/21/20 0252 02/22/20 0207 02/23/20 0350  WBC 7.7 7.2 6.5  RBC 3.27* 3.29* 3.43*  HGB 9.0* 9.3* 9.4*  HCT 27.4* 27.3* 28.8*  MCV 83.8 83.0 84.0  MCH 27.5 28.3 27.4  MCHC 32.8 34.1 32.6  RDW 16.1* 16.4* 16.4*  PLT 261 270 275    BNP Recent Labs  Lab 02/18/20 0225  BNP 2,867.6*     DDimer No results for input(s): DDIMER in the last 168 hours.   Radiology    No results found.  Cardiac Studies   Right/Left Cardiac Catheterization 02/13/2020:   Non-stenotic Mid LAD lesion  was previously treated.  Prox Cx to Mid Cx lesion is 20% stenosed.  Dist RCA lesion is 20% stenosed.  Non-stenotic Mid RCA lesion was previously treated.  Hemodynamic findings consistent with mild pulmonary hypertension.  LV end diastolic pressure is mildly elevated.   1. Nonobstructive CAD. Continued excellent patency of stents in the LAD and RCA 2. Mildly elevated LV filling pressures 3. Mildly elevated right heart pressures. 4. Normal cardiac output  Plan: continue medical therapy. Diuresis. BP control.  Diagnostic Dominance: Right     _______________  Echocardiogram 02/17/2020: Impression 1. Compared with the echo 16/3846, systolic function is worse and the  pleural effusion is new.  2. Hypokinesis of the basal to mid anteroseptal, inferoseptal and  inferior myocardium. Left ventricular ejection fraction, by estimation, is  40 to 45%. The left ventricle has mildly decreased function. The left  ventricle demonstrates regional wall  motion abnormalities (see scoring diagram/findings for description). Left  ventricular diastolic parameters are consistent with Grade II diastolic  dysfunction (pseudonormalization). Elevated left ventricular end-diastolic  pressure.  3. Moderate pleural effusion in the left lateral region.  4. Mild to moderate mitral valve regurgitation.  5. Tricuspid valve regurgitation is moderate.  6. The aortic valve is tricuspid. Aortic valve regurgitation is mild.  7. There is moderately elevated pulmonary artery systolic pressure.  8. The inferior vena cava is dilated in size with <50% respiratory  variability, suggesting right atrial pressure of 15 mmHg.   Patient Profile     Crystal [name] is a 86 y.o. female with a history of history of CAD with recent STEMI in 01/2019 s/p DES to mid LAD and then recent NSTEMI in 12/2019 s/p DES to RCA, chronic systolic CHF with improvement of EF to 50-55% on Echo in 12/2019, hypertension, hyperlipidemia,  type 2 diabetes, and CKD III-IV who presented with exertional shortness of breath and found to have elevated troponin concerning for NSTEMI.   Assessment & Plan    Dyspnea Acute on Chronic Combined CHF - BNP elevated at 2,867 (down from 4,423).  - Chest x-ray with mild diffuse interstitial prominence suggestive of mild interstitial edema as well as small left and possible trace right pleural effusions.  - Right/left cardiac catheterization showed patent stents with mildly elevated LV filling pressures, mildly  elevated right heart pressures, and normal cardiac output. - Echo showed LVEF 40-45% with hypokinesis of basal to mid anteroseptal, inferoseptal, and inferior myocardium. - She initially was given IV Lasix with little urinary response and rise in creatinine. She was then given some IV fluids with worsening lower extremity edema. Last dose of Lasix was 1/23. Only 300 mL of documented urinary output yesterday. Net negative 232 mL this admission. Do not think weight from yesterday is accurate. Weights the last 3 days: 146 lbs >> 139 lbs >> 148. Renal function improving and creatinine 2.16 today. - She does report worsening shortness of breath this morning and minimal lower extremity edema. JVD also appears to be elevated. Patient has decreased breath sounds in bases. Will recheck chest x-ray to reassess pleural effusion. - May need to restart PO Lasix 20mg  daily (dose recommended by Nephrology yesterday). Will discuss with MD. - No ACEI/ARB/ARNI due to renal function. - Continue beta-blocker. - Continue to monitor daily weight, strict I/O's, and renal function.  New Onset Atrial Fibrillation/Flutter - Briefly noted this admission on 02/15/2020 but converted to normal sinus rhythm quickly after the start of IV Amiodarone. Also had an episode of atrial flutter with RVR. She was given a IV Amiodarone bolus and then went into atrial fibrillation with RVR. Now back in sinus rhythm. - She was initially  complaining of nausea but this has now completely resolved. - Now on PO Amiodarone. Plan is for 400mg  twice daily x1 week >> 400mg  once daily x 2 weeks >> 200mg  once daily. - Continue Eliquis 25mg  twice daily (reduced dose given age and renal function).  Demand Ischemia - Patient presented with exertional dyspnea and found to have elevated troponin initially concerning for NSTEMI. - High-sensitivity troponin peaked at 1,735.  - R/LHC on 02/13/2019 showed patent stents to LAD and RCA and otherwise non-obstructive CAD. - Likely demand ischemia from CHF and atrial fibrillation.   Hypertension - BP markedly elevated this morning at 224/76. - Increase Hydralazine to 100mg  three times daily. - Increase Imdur to 60mg  daily. - Stop Lopressor and switch to Coreg 6.25mg  twice daily. - Patient also seems very anxious this morning which may be contributing to BP. Will give one dose of Xanax 0.25mg .  Hyperlipidemia - Lipid panel this admission: Total Cholesterol 155, Triglycerides 74, HDL 57, LDL 83. - LDL goal <70 given CAD. - Prior statin intolerance to Pitavastatin. PCSK9 inhibitor too expensive. - Patient agreeable to try Crestor 2.5mg  every other day.  Iron Deficiency Anemia Hemoccult Positive - Hemoglobin nadir 7.9 this admission on on 1/19. Recent baseline around 9-10. - Hemoccult was positive. - GI consulted. Anemia felt to most likely be secondary to CKD. Endoscopic evaluation not recommended given active cardiac issues. Aspirin was stopped. They were OK with Eliquis being resumed. Recommended continuing Protonix 40mg  indefinitely. GI signed off on 02/16/2020. - Will need follow-up with primary GI physician (Dr. Gala Romney) in 4-6 weeks.   Acute on CKD Stage III - Creatinine 1.66 on admission and peaked at 3.47 on 1/22. Baseline creatinine 1.3 to 1.9. - Renal ultrasound unremarkable. - Urinalysis with minimal protein and no bacteria or RBC. - Bladder scan showed no urinary retention. -  Nephrology consulted. Felt to be due to contrast nephropathy in association with hemodynamic changes related to atrial fibrillation with RVR and CHF/cardiorenal syndrome. Nephrology signed off on 1/26 and will follow-up as outpatient.  Hyponatremia - Sodium 129 on admission. Initially improved with diuresis and then dropped again as low as 126  on 1/24. Starting to come up again. Stable at 129 today. - Monitor closely now that we are restarting home diuretics. - Continue fluid restriction.   For questions or updates, please contact [place] Please consult [email or web address] for contact info under        Signed, Darreld Mclean, PA-C  02/23/2020, 7:04 AM

## 2020-02-23 NOTE — Progress Notes (Signed)
Assisted with transfer to 2H18 via bed with heart monitor and O2 via NRB.  Patient in acute respiratory distress with crackle up to collar bone.  RT placed patient on Bipap upon arrival to ICU.  ICU staff at bedside to receive patient.

## 2020-02-23 NOTE — Procedures (Signed)
Thoracentesis  Procedure Note  Crystal [name]  [id number]  [birth year]  Date:02/23/20  Time:11:09 AM   Provider Performing:Aradhana Gin C Tamala Julian   Procedure: Thoracentesis with imaging guidance ([id number])  Indication(s) Pleural Effusion  Consent Risks of the procedure as well as the alternatives and risks of each were explained to the patient and/or caregiver.  Consent for the procedure was obtained and is signed in the bedside chart  Anesthesia Topical only with 1% lidocaine    Time Out Verified patient identification, verified procedure, site/side was marked, verified correct patient position, special equipment/implants available, medications/allergies/relevant history reviewed, required imaging and test results available.   Sterile Technique Maximal sterile technique including full sterile barrier drape, hand hygiene, sterile gown, sterile gloves, mask, hair covering, sterile ultrasound probe cover (if used).  Procedure Description Ultrasound was used to identify appropriate pleural anatomy for placement and overlying skin marked.  Area of drainage cleaned and draped in sterile fashion. Lidocaine was used to anesthetize the skin and subcutaneous tissue.  800 cc's of straw appearing fluid was drained from the left pleural space. Catheter then removed and bandaid applied to site.   Complications/Tolerance None; patient tolerated the procedure well. Chest X-ray is ordered to confirm no post-procedural complication.   EBL Minimal   Specimen(s) None

## 2020-02-23 NOTE — Consult Note (Signed)
NAME:  Crystal [name], MRN:  [id number], DOB:  02-17-1934, LOS: 25 ADMISSION DATE:  02/12/2020, CONSULTATION DATE:  02/23/20 REFERRING MD:  Radford Pax, CHIEF COMPLAINT:  SOB   Brief History   86 year old woman with hx of ischemic cardiomyopathy presenting with decompensated heart failure requiring ICU transfer.  History of present illness   86 year old woman with history of multiple ACS s/p PCIs presenting with worsening SOB and elevated troponin.  She was taken for L/R heart catheterizations showing mildly elevated filling pressures, normal CI, and no evidence of recurrent ACS.  Hospital stay complicated by Afib/RVR requiring amiodarone and acute on chronic renal injury.  Initially given lasix worsening creatinine then given fluids worsening breathing status.  Breathing worsened this am and she was brought to ICU on BIPAP.  She complains of anxiety, PND, leg swelling, and inability to take in deep breaths.  CXR with edema and pleural effusions.  Past Medical History  T2DM HLD GERD HTN CHF CAD Distant breast ca  Significant Hospital Events   1/17 admitted 1/27 icu transfer  Consults:  Cards/Nephro/CCM  Procedures:   Patient Lines/Drains/Airways Status    Active Line/Drains/Airways    Name Placement date Placement time Site Days   Peripheral IV 02/13/20 Left Forearm 02/13/20  1055  Forearm  10            Significant Diagnostic Tests:  1/17 R/LHC  Left Anterior Descending  Non-stenotic Mid LAD lesion was previously treated.  First Diagonal Branch  Vessel is moderate in size.  Left Circumflex  Vessel is large.  Prox Cx to Mid Cx lesion is 20% stenosed.  Right Coronary Artery  Vessel is large.  Non-stenotic Mid RCA lesion was previously treated. Vessel is the culprit lesion. The lesion is irregular and ulcerative.  Dist RCA lesion is 20% stenosed.   RHC: PA 44/14, wedge 18, CI 3.3 fick  Echo 1/21 1. Compared with the echo 17/6160, systolic function is worse  and the  pleural effusion is new.  2. Hypokinesis of the basal to mid anteroseptal, inferoseptal and  inferior myocardium. Left ventricular ejection fraction, by estimation, is  40 to 45%. The left ventricle has mildly decreased function. The left  ventricle demonstrates regional wall  motion abnormalities (see scoring diagram/findings for description). Left  ventricular diastolic parameters are consistent with Grade II diastolic  dysfunction (pseudonormalization). Elevated left ventricular end-diastolic  pressure.   Micro Data:  MRSA PCR neg COVID neg  Antimicrobials:  none   Interim history/subjective:  Consulted, ros as below  Objective   Blood pressure (!) 196/103, pulse (!) 114, temperature 98.2 F (36.8 C), resp. rate (!) 23, height 5\' 5"  (1.651 m), weight 67.2 kg, SpO2 91 %.        Intake/Output Summary (Last 24 hours) at 02/23/2020 1111 Last data filed at 02/22/2020 2000 Gross per 24 hour  Intake 960 ml  Output 300 ml  Net 660 ml   Filed Weights   02/21/20 0721 02/22/20 0459 02/23/20 0500  Weight: 66.6 kg 63.1 kg 67.2 kg    Examination: Constitutional: elderly woman in moderate resp distress  Eyes: EOMI, pupils equal Ears, nose, mouth, and throat: BIPAP in place with poor seal, trachea midline Cardiovascular: bradycardic, regular, sinus on monitor, ext warm Respiratory: Crackles and diminished at bases, tachypneic Gastrointestinal: Soft, +BS Skin: No rashes, normal turgor Neurologic: moves all 4 ext to command Psychiatric: anxious, AOx3   K low: repleting Mg not checked Low sodium  Resolved Hospital Problem  list   n/a  Assessment & Plan:  -Acute hypoxemic respiratory failure due to acute on chronic pulmonary edema: incited by Afib/RVR, panic attack, enlarging effusions -Acute on chronic systolic and diastolic heart failure -Chronic benzodiazepine use: ?dizzy spells -Hyponatremia I suspect related to dysregulated ADH in setting of heart failure  -  Ativan 0.5mg  x 1, thora x 2, wean off BIPAP - further diuresis, afib management, and GDMT per cardiology team - will follow with you to assure heading in right direction   Best practice:  Diet: heart healthy Pain/Anxiety/Delirium protocol (if indicated): cautious benzos PRN VAP protocol (if indicated): n/a DVT prophylaxis: per primary GI prophylaxis: PPI Glucose control: n/a Mobility: BR for now Code Status: per primary Family Communication: per primary Disposition: ICU pending improvement in resp status   Medical Decision Making    Diagnoses that are immediately life threatening include respiratory distress/failure Critical test findings: CXR edema, effusions Interventions today to address these diagnoses are thora x 2, anxiety control Likelihood of life-threatening deterioration without intervention is high.  Labs   CBC: Recent Labs  Lab 02/19/20 0231 02/20/20 0100 02/21/20 0252 02/22/20 0207 02/23/20 0350  WBC 7.9 7.5 7.7 7.2 6.5  HGB 8.5* 8.9* 9.0* 9.3* 9.4*  HCT 26.1* 26.9* 27.4* 27.3* 28.8*  MCV 84.5 82.8 83.8 83.0 84.0  PLT 217 242 261 270 973    Basic Metabolic Panel: Recent Labs  Lab 02/19/20 0231 02/20/20 0100 02/21/20 0252 02/22/20 0207 02/23/20 0350  NA 127* 126* 128* 129* 129*  K 4.3 3.2* 3.5 4.0 3.9  CL 96* 93* 93* 94* 96*  CO2 18* 21* 23 24 24   GLUCOSE 98 113* 103* 111* 121*  BUN 56* 52* 54* 51* 43*  CREATININE 3.29* 3.26* 2.99* 2.51* 2.16*  CALCIUM 8.7* 8.4* 8.6* 8.5* 8.6*   GFR: Estimated Creatinine Clearance: 17.8 mL/min (A) (by C-G formula based on SCr of 2.16 mg/dL (H)). Recent Labs  Lab 02/20/20 0100 02/21/20 0252 02/22/20 0207 02/23/20 0350  WBC 7.5 7.7 7.2 6.5    Liver Function Tests: Recent Labs  Lab 02/18/20 0225 02/19/20 0231  ALBUMIN 2.8* 3.6   No results for input(s): LIPASE, AMYLASE in the last 168 hours. No results for input(s): AMMONIA in the last 168 hours.  ABG    Component Value Date/Time   PHART 7.476  (H) 02/13/2020 1428   PCO2ART 34.3 02/13/2020 1428   PO2ART 132 (H) 02/13/2020 1428   HCO3 26.3 02/13/2020 1437   TCO2 27 02/13/2020 1437   O2SAT 63.0 02/13/2020 1437     Coagulation Profile: No results for input(s): INR, PROTIME in the last 168 hours.  Cardiac Enzymes: No results for input(s): CKTOTAL, CKMB, CKMBINDEX, TROPONINI in the last 168 hours.  HbA1C: Hgb A1c MFr Bld  Date/Time Value Ref Range Status  01/16/2020 06:42 PM 6.0 (H) 4.8 - 5.6 % Final    Comment:    (NOTE) Pre diabetes:          5.7%-6.4%  Diabetes:              >6.4%  Glycemic control for   <7.0% adults with diabetes   02/28/2019 05:32 AM 6.1 (H) 4.8 - 5.6 % Final    Comment:    (NOTE) Pre diabetes:          5.7%-6.4% Diabetes:              >6.4% Glycemic control for   <7.0% adults with diabetes     CBG: Recent Labs  Lab  02/22/20 0751 02/22/20 1131 02/22/20 1711 02/22/20 2212 02/23/20 0750  GLUCAP 158* 120* 199* 140* 155*    Review of Systems:    Positive Symptoms in bold:  Constitutional fevers, chills, weight loss, fatigue, anorexia, malaise  Eyes decreased vision, double vision, eye irritation  Ears, Nose, Mouth, Throat sore throat, trouble swallowing, sinus congestion  Cardiovascular chest pain, paroxysmal nocturnal dyspnea, lower ext edema, palpitations   Respiratory SOB, cough, DOE, hemoptysis, wheezing  Gastrointestinal nausea, vomiting, diarrhea  Genitourinary burning with urination, trouble urinating  Musculoskeletal joint aches, joint swelling, back pain  Integumentary  rashes, skin lesions  Neurological focal weakness, focal numbness, trouble speaking, headaches  Psychiatric depression, anxiety, confusion  Endocrine polyuria, polydipsia, cold intolerance, heat intolerance  Hematologic abnormal bruising, abnormal bleeding, unexplained nose bleeds  Allergic/Immunologic recurrent infections, hives, swollen lymph nodes     Past Medical History  She,  has a past  medical history of Bell's palsy, Breast cancer ([name]) (1998), CAD (coronary artery disease), CHF (congestive heart failure) ([place]), Essential hypertension, GERD (gastroesophageal reflux disease), Gout, History of skin cancer, Mixed hyperlipidemia, Osteopenia, Thyroid nodule, and Type 2 diabetes mellitus ([place]).   Surgical History    Past Surgical History:  Procedure Laterality Date  . BIOPSY  10/13/2019   Procedure: BIOPSY;  Surgeon: Daneil Dolin, MD;  [facility];  Service: Endoscopy;;  . CATARACT EXTRACTION  2016  . CORONARY ANGIOGRAPHY N/A 01/16/2020   Procedure: CORONARY ANGIOGRAPHY;  Surgeon: Jettie Booze, MD;  Location: [place] [facility];  Service: Cardiovascular;  Laterality: N/A;  . CORONARY STENT INTERVENTION N/A 01/16/2020   Procedure: CORONARY STENT INTERVENTION;  Surgeon: Jettie Booze, MD;  Location: [place] [facility];  Service: Cardiovascular;  Laterality: N/A;  . CORONARY/GRAFT ACUTE MI REVASCULARIZATION N/A 01/30/2019   Procedure: Coronary/Graft Acute MI Revascularization;  Surgeon: Burnell Blanks, MD;  Location: [facility] [facility];  Service: Cardiovascular;  Laterality: N/A;  . ESOPHAGOGASTRODUODENOSCOPY (EGD) WITH PROPOFOL N/A 10/13/2019   Non-obstructing Schatzki ring at GE junction, s/p dilation, erosive gastropathy with stigmata of recent bleeding, normal duodenum. Negative H.pylori.   Marland Kitchen HYSTEROSCOPY    . INTRAVASCULAR ULTRASOUND/IVUS N/A 01/16/2020   Procedure: Intravascular Ultrasound/IVUS;  Surgeon: Jettie Booze, MD;  Location: [place] [facility];  Service: Cardiovascular;  Laterality: N/A;  . LEFT HEART CATH AND CORONARY ANGIOGRAPHY N/A 01/30/2019   Procedure: LEFT HEART CATH AND CORONARY ANGIOGRAPHY;  Surgeon: Burnell Blanks, MD;  Location: [place] [facility];  Service: Cardiovascular;  Laterality: N/A;  . LEFT HEART CATH AND CORONARY ANGIOGRAPHY N/A 01/16/2020   Procedure: LEFT HEART CATH AND CORONARY ANGIOGRAPHY;   Surgeon: Jettie Booze, MD;  Location: [place] [facility];  Service: Cardiovascular;  Laterality: N/A;  . Venia Minks DILATION N/A 10/13/2019   Procedure: Venia Minks DILATION;  Surgeon: Daneil Dolin, MD;  [facility];  Service: Endoscopy;  Laterality: N/A;  . Right mastectomy  1998   [place]  . RIGHT/LEFT HEART CATH AND CORONARY ANGIOGRAPHY N/A 02/13/2020   Procedure: RIGHT/LEFT HEART CATH AND CORONARY ANGIOGRAPHY;  Surgeon: Martinique, Peter M, MD;  Location: [place] [facility];  Service: Cardiovascular;  Laterality: N/A;     Social History   reports that she has never smoked. She has never used smokeless tobacco. She reports that she does not drink alcohol and does not use drugs.   Family History   Her family history includes Aneurysm in her sister; Cancer in her sister; Heart attack in her  father, maternal uncle, maternal uncle, maternal uncle, paternal aunt, paternal aunt, paternal grandfather, and sister; Heart disease in her maternal uncle, maternal uncle, maternal uncle, paternal aunt, paternal aunt, paternal grandfather, and sister; Stroke in her mother. There is no history of Colon cancer or Colon polyps.   Allergies Allergies  Allergen Reactions  . Bactrim [Sulfamethoxazole-Trimethoprim] Nausea And Vomiting  . Sulfa Antibiotics Nausea And Vomiting  . Amlodipine Other (See Comments)    EDEMA  . Clonidine Derivatives Other (See Comments)    FATIGUE  . Evista [Raloxifene Hcl] Other (See Comments)    GERD  . Fosamax [Alendronate Sodium] Other (See Comments)    INCREASED GERD   . Glipizide Other (See Comments)    PALPITATIONS  . Lipitor [Atorvastatin Calcium] Other (See Comments)    ACHING  . Losartan Other (See Comments)    FATIGUE   . Pravastatin Other (See Comments)    ACHING  . Azithromycin Rash and Other (See Comments)    FACIAL BURNING      Home Medications  Prior to Admission medications   Medication Sig Start Date End Date Taking? Authorizing Provider   acetaminophen (TYLENOL) 500 MG tablet Take 500 mg by mouth every 6 (six) hours as needed for headache (pain).   Yes [provider]  Alogliptin Benzoate 12.5 MG TABS Take 12.5 mg by mouth daily at 2 PM.  06/29/19  Yes [provider]  aspirin EC 81 MG tablet Take 81 mg by mouth every morning. (0930)   Yes [provider]  Calcium Carbonate-Vitamin D (CALCIUM 500 + D PO) Take 1 tablet by mouth daily before lunch. Citracal plus D3   Yes [provider]  carvedilol (COREG) 25 MG tablet Take 1 tablet (25 mg total) by mouth in the morning and at bedtime. Breakfast & supper 11/28/19  Yes Satira Sark, MD  cholecalciferol (VITAMIN D3) 25 MCG (1000 UT) tablet Take 1,000 Units by mouth daily with breakfast.   Yes [provider]  CINNAMON PO Take 1,000 mg by mouth daily before lunch.    Yes [provider]  clopidogrel (PLAVIX) 75 MG tablet TAKE 1 TABLET BY MOUTH DAILY--STOP BRILLINTA. Patient taking differently: Take 75 mg by mouth every morning. 10/05/19  Yes Satira Sark, MD  diazepam (VALIUM) 2 MG tablet Take 2 mg by mouth 2 (two) times daily as needed (dizzy spells.).   Yes [provider]  Doxepin HCl 3 MG TABS Take 3 mg by mouth at bedtime as needed (sleep.).  09/26/19  Yes [provider]  ferrous sulfate 325 (65 FE) MG tablet Take 325 mg by mouth daily before lunch.   Yes [provider]  furosemide (LASIX) 20 MG tablet Take 1 tablet (20 mg total) by mouth daily. 03/10/19 10/05/20 Yes Strader, Fransisco Hertz, PA-C  hydrALAZINE (APRESOLINE) 50 MG tablet Take 0.5 tablets (25 mg total) by mouth in the morning and at bedtime. 02/10/20 05/10/20 Yes Satira Sark, MD  isosorbide mononitrate (IMDUR) 30 MG 24 hr tablet Take 1 tablet (30 mg total) by mouth daily. Patient taking differently: Take 30 mg by mouth at bedtime. 01/19/20  Yes Nahser, Wonda Cheng, MD  loperamide (IMODIUM) 2 MG capsule Take 2-4 mg by mouth 4 (four) times  daily as needed for diarrhea or loose stools.   Yes [provider]  meclizine (ANTIVERT) 25 MG tablet Take 25 mg by mouth 2 (two) times daily as needed for dizziness.   Yes [provider]  Multiple  Vitamin (MULTIVITAMIN WITH MINERALS) TABS tablet Take 1 tablet by mouth daily. Centrum Silver for Women   Yes [provider]  multivitamin-lutein (OCUVITE-LUTEIN) CAPS capsule Take 1 capsule by mouth daily before lunch.    Yes [provider]  nitroGLYCERIN (NITROSTAT) 0.4 MG SL tablet Place 1 tablet (0.4 mg total) under the tongue every 5 (five) minutes x 3 doses as needed for chest pain. 02/02/19  Yes Cheryln Manly, NP  Omega-3 Fatty Acids (FISH OIL PO) Take 1,576 mg by mouth daily before lunch.   Yes [provider]  pantoprazole (PROTONIX) 40 MG tablet Take 1 tablet (40 mg total) by mouth daily. Take 30 minutes before breakfast 01/12/20  Yes Annitta Needs, NP  Pitavastatin Calcium (LIVALO) 1 MG TABS Take 1 tablet (1 mg total) by mouth daily. 02/02/20  Yes Satira Sark, MD  saccharomyces boulardii (FLORASTOR) 250 MG capsule Take 250 mg by mouth 2 (two) times daily as needed (diarrhea).    Yes [provider]  sodium chloride (OCEAN) 0.65 % SOLN nasal spray Place 1 spray into both nostrils 2 (two) times daily as needed for congestion.    Yes [provider]     Critical care time: 34 minutes not including separately billable procedures

## 2020-02-24 ENCOUNTER — Inpatient Hospital Stay (HOSPITAL_COMMUNITY): Payer: Medicare Other

## 2020-02-24 DIAGNOSIS — R0602 Shortness of breath: Secondary | ICD-10-CM | POA: Diagnosis not present

## 2020-02-24 DIAGNOSIS — I214 Non-ST elevation (NSTEMI) myocardial infarction: Secondary | ICD-10-CM | POA: Diagnosis not present

## 2020-02-24 DIAGNOSIS — I161 Hypertensive emergency: Secondary | ICD-10-CM | POA: Diagnosis not present

## 2020-02-24 DIAGNOSIS — I5043 Acute on chronic combined systolic (congestive) and diastolic (congestive) heart failure: Secondary | ICD-10-CM | POA: Diagnosis not present

## 2020-02-24 DIAGNOSIS — J9601 Acute respiratory failure with hypoxia: Secondary | ICD-10-CM | POA: Diagnosis not present

## 2020-02-24 DIAGNOSIS — I248 Other forms of acute ischemic heart disease: Secondary | ICD-10-CM | POA: Diagnosis not present

## 2020-02-24 DIAGNOSIS — I130 Hypertensive heart and chronic kidney disease with heart failure and stage 1 through stage 4 chronic kidney disease, or unspecified chronic kidney disease: Secondary | ICD-10-CM | POA: Diagnosis not present

## 2020-02-24 LAB — CBC
HCT: 30.3 % — ABNORMAL LOW (ref 36.0–46.0)
Hemoglobin: 9.7 g/dL — ABNORMAL LOW (ref 12.0–15.0)
MCH: 27.2 pg (ref 26.0–34.0)
MCHC: 32 g/dL (ref 30.0–36.0)
MCV: 84.9 fL (ref 80.0–100.0)
Platelets: 292 10*3/uL (ref 150–400)
RBC: 3.57 MIL/uL — ABNORMAL LOW (ref 3.87–5.11)
RDW: 16.4 % — ABNORMAL HIGH (ref 11.5–15.5)
WBC: 6.9 10*3/uL (ref 4.0–10.5)
nRBC: 0 % (ref 0.0–0.2)

## 2020-02-24 LAB — GLUCOSE, CAPILLARY
Glucose-Capillary: 106 mg/dL — ABNORMAL HIGH (ref 70–99)
Glucose-Capillary: 119 mg/dL — ABNORMAL HIGH (ref 70–99)
Glucose-Capillary: 122 mg/dL — ABNORMAL HIGH (ref 70–99)
Glucose-Capillary: 173 mg/dL — ABNORMAL HIGH (ref 70–99)
Glucose-Capillary: 91 mg/dL (ref 70–99)
Glucose-Capillary: 97 mg/dL (ref 70–99)
Glucose-Capillary: 98 mg/dL (ref 70–99)

## 2020-02-24 LAB — BASIC METABOLIC PANEL
Anion gap: 11 (ref 5–15)
BUN: 43 mg/dL — ABNORMAL HIGH (ref 8–23)
CO2: 26 mmol/L (ref 22–32)
Calcium: 8.6 mg/dL — ABNORMAL LOW (ref 8.9–10.3)
Chloride: 96 mmol/L — ABNORMAL LOW (ref 98–111)
Creatinine, Ser: 2.11 mg/dL — ABNORMAL HIGH (ref 0.44–1.00)
GFR, Estimated: 23 mL/min — ABNORMAL LOW (ref >60)
Glucose, Bld: 104 mg/dL — ABNORMAL HIGH (ref 70–99)
Potassium: 3.7 mmol/L (ref 3.5–5.1)
Sodium: 133 mmol/L — ABNORMAL LOW (ref 135–145)

## 2020-02-24 LAB — MAGNESIUM: Magnesium: 2.2 mg/dL (ref 1.7–2.4)

## 2020-02-24 MED ORDER — ISOSORBIDE MONONITRATE ER 60 MG PO TB24
90.0000 mg | ORAL_TABLET | Freq: Every day | ORAL | Status: DC
  Administered 2020-02-24 – 2020-02-25 (×2): 90 mg via ORAL
  Filled 2020-02-24 (×2): qty 1

## 2020-02-24 MED ORDER — FUROSEMIDE 10 MG/ML IJ SOLN
40.0000 mg | Freq: Once | INTRAMUSCULAR | Status: AC
  Administered 2020-02-24: 40 mg via INTRAVENOUS
  Filled 2020-02-24: qty 4

## 2020-02-24 MED ORDER — HYDRALAZINE HCL 20 MG/ML IJ SOLN
5.0000 mg | Freq: Once | INTRAMUSCULAR | Status: AC
  Administered 2020-02-24: 5 mg via INTRAVENOUS
  Filled 2020-02-24: qty 1

## 2020-02-24 MED ORDER — HYDRALAZINE HCL 20 MG/ML IJ SOLN
5.0000 mg | INTRAMUSCULAR | Status: DC | PRN
  Administered 2020-02-24 – 2020-02-25 (×5): 10 mg via INTRAVENOUS
  Administered 2020-02-26: 5 mg via INTRAVENOUS
  Administered 2020-02-26: 10 mg via INTRAVENOUS
  Administered 2020-02-28: 5 mg via INTRAVENOUS
  Filled 2020-02-24 (×8): qty 1

## 2020-02-24 MED ORDER — INSULIN ASPART 100 UNIT/ML ~~LOC~~ SOLN
0.0000 [IU] | SUBCUTANEOUS | Status: DC
  Administered 2020-02-24 – 2020-02-25 (×3): 1 [IU] via SUBCUTANEOUS

## 2020-02-24 MED ORDER — FUROSEMIDE 20 MG PO TABS
20.0000 mg | ORAL_TABLET | Freq: Every day | ORAL | Status: DC
  Administered 2020-02-25: 20 mg via ORAL
  Filled 2020-02-24: qty 1

## 2020-02-24 NOTE — Plan of Care (Signed)

## 2020-02-24 NOTE — Progress Notes (Signed)
Renal duplex  has been completed. Refer to [facility] under chart review to view preliminary results.   02/24/2020  3:40 PM Crystal [name], Crystal [name]

## 2020-02-24 NOTE — Progress Notes (Addendum)
[facility] NEPHROLOGY PROGRESS NOTE  Assessment/ Plan: Pt is a 86 y.o. yo female  with history of hypertension, HLD, CKD 3 with baseline creatinine level around 1.3-1.9, chronic combined CHF, CAD who was admitted on 1/17 for acute CHF exacerbation and an NSTEMI, seen as a consultation for acute kidney injury on CKD.  #Acute kidney injury on CKD stage IIIb/IV: Likely due to contrast nephropathy in association with hemodynamic changes related with A. fib with RVR, CHF/cardiorenal syndrome. UA with minimal protein but has bacteria, no RBC. Reportedly bladder scan with no urinary retention.  The kidney ultrasound ruled out obstruction.   Creatinine is now improved from peak of 3.5 on 1/22 to 2.1 today even with diuresis yesterday.  Avoid nephrotoxins or hypotensive episode.  No need for dialysis.   Will arrange outpt nephrology follow up in clinic.   #Acute CHF exacerbation: She's quite sensitive and has required diuretics, fluids, diuretics.  Yesterday HTN driven flash pulmonary edema - lasix 40 IV with fair diuresis; repeat dose today with rales in lungs still.  Resume lasix 20 po daily tomorrow.  Daily weights, 2g sodium, fluid restriction.  #Hyponatremia, hypervolemic: Improving with diuresis  # NSTEMI: Status post cardiac cath with stent placement in RCA and LAD.  On Plavix.  No Eliquis or aspirin due to GI bleed initially but has been resumed now with new a fib.  #Anemia with stool occult blood positive:  Anticoag was held but resumed at lower dose 1/23.   Treated with IV iron.  Monitor hemoglobin, currently stable in 9s.  #A. fib with RVR: On amiodarone and metoprolol with cardiology managing.  Low dose eliquis added.   #Hypertension: Blood pressure high and cardiology has been adjusting meds; imdur titrated today.  I think we should check for RAS with HTN urgency, flash pulm edema and propensity for AKI - being done at 2pm per RN  Will follow - page with issues.      Subjective:  Improved with thoracenteses, IV diuresis yesterday.    Objective Vital signs in last 24 hours: Vitals:   02/24/20 1000 02/24/20 1100 02/24/20 1110 02/24/20 1200  BP: (!) 158/50 (!) 156/50  (!) 159/51  Pulse: 63 (!) 59  (!) 58  Resp: (!) 24 20  (!) 26  Temp:   97.9 F (36.6 C)   TempSrc:      SpO2: 98% 98%  96%  Weight:      Height:       Weight change: -1.578 kg  Intake/Output Summary (Last 24 hours) at 02/24/2020 1424 Last data filed at 02/24/2020 0800 Gross per 24 hour  Intake 200 ml  Output 950 ml  Net -750 ml       Labs: Basic Metabolic Panel: Recent Labs  Lab 02/22/20 0207 02/23/20 0350 02/24/20 0036  NA 129* 129* 133*  K 4.0 3.9 3.7  CL 94* 96* 96*  CO2 24 24 26   GLUCOSE 111* 121* 104*  BUN 51* 43* 43*  CREATININE 2.51* 2.16* 2.11*  CALCIUM 8.5* 8.6* 8.6*   Liver Function Tests: Recent Labs  Lab 02/18/20 0225 02/19/20 0231  ALBUMIN 2.8* 3.6   No results for input(s): LIPASE, AMYLASE in the last 168 hours. No results for input(s): AMMONIA in the last 168 hours. CBC: Recent Labs  Lab 02/20/20 0100 02/21/20 0252 02/22/20 0207 02/23/20 0350 02/24/20 0036  WBC 7.5 7.7 7.2 6.5 6.9  HGB 8.9* 9.0* 9.3* 9.4* 9.7*  HCT 26.9* 27.4* 27.3* 28.8* 30.3*  MCV 82.8 83.8  83.0 84.0 84.9  PLT 242 261 270 275 292   Cardiac Enzymes: No results for input(s): CKTOTAL, CKMB, CKMBINDEX, TROPONINI in the last 168 hours. CBG: Recent Labs  Lab 02/23/20 1525 02/23/20 2117 02/24/20 0647 02/24/20 0756 02/24/20 1109  GLUCAP 159* 116* 98 173* 119*    Iron Studies: No results for input(s): IRON, TIBC, TRANSFERRIN, FERRITIN in the last 72 hours. Studies/Results: DG Chest 1 View  Result Date: 02/23/2020 CLINICAL DATA:  Status post thoracentesis. EXAM: CHEST  1 VIEW COMPARISON:  02/23/2020 FINDINGS: Heart size is within normal limits. Mild pulmonary vascular congestion. Blunted left lateral costophrenic angle likely due to small pleural  effusion. Exam is limited due to exclusion of the lung apices and multiple overlying support structures. IMPRESSION: 1. Interval decrease of bilateral pleural effusions. Small effusion limb aimed on the left. 2. Exam is limited due to multiple overlying support structures as well as exclusion of the lung apices. Very small apical pneumothorax is not excluded. Electronically Signed   By: Miachel Roux M.D.   On: 02/23/2020 11:36   DG CHEST PORT 1 VIEW  Result Date: 02/24/2020 CLINICAL DATA:  Evaluate for pleural effusion. EXAM: PORTABLE CHEST 1 VIEW COMPARISON:  02/23/2020 FINDINGS: Cardiac pads have been removed from the chest. Persistent densities at the left lung base. Heart size is within normal limits and stable. Right lung is clear. Probable scarring at the lung apices. Atherosclerotic calcifications at the aortic arch. IMPRESSION: Residual densities at the left lung base. Findings could represent consolidation or small effusion. Electronically Signed   By: Markus Daft M.D.   On: 02/24/2020 08:28   DG CHEST PORT 1 VIEW  Result Date: 02/23/2020 CLINICAL DATA:  Worsening shortness of breath over the last 2 days. EXAM: PORTABLE CHEST 1 VIEW COMPARISON:  02/17/2020 FINDINGS: Worsened congestive heart failure pattern. Cardiomegaly. Aortic atherosclerosis. Pulmonary venous hypertension. Interstitial pulmonary edema. Enlarging bilateral effusions with associated lower lobe atelectasis. IMPRESSION: Worsening congestive heart failure pattern. Enlarging bilateral effusions with associated lower lobe atelectasis. Electronically Signed   By: Nelson Chimes M.D.   On: 02/23/2020 08:39    Medications: Infusions: . sodium chloride      Scheduled Medications: . amiodarone  400 mg Oral BID  . apixaban  2.5 mg Oral BID  . calcium-vitamin D  1 tablet Oral QAC lunch  . carvedilol  6.25 mg Oral BID WC  . Chlorhexidine Gluconate Cloth  6 each Topical Daily  . cholecalciferol  1,000 Units Oral Q breakfast  .  clopidogrel  75 mg Oral q morning - 10a  . ferrous sulfate  325 mg Oral QAC lunch  . hydrALAZINE  100 mg Oral Q8H  . insulin aspart  0-6 Units Subcutaneous Q4H  . isosorbide mononitrate  90 mg Oral QHS  . pantoprazole  40 mg Oral Daily  . polycarbophil  625 mg Oral Daily  . rosuvastatin  2.5 mg Oral Q48H  . saccharomyces boulardii  250 mg Oral BID  . sodium chloride flush  3 mL Intravenous Q12H    have reviewed scheduled and prn medications.  Physical Exam: General:sitting in chair alert Heart: SR on monitor in 90s Lungs: clear ant, normal WOB at rest, rales in both bases Abdomen:soft, Non-tender, non-distended Extremities: 1+ ankle edema worse than earlier in week Neurology: Gillian Scarce 02/24/2020,2:24 PM  LOS: 11 days

## 2020-02-24 NOTE — Progress Notes (Signed)
[facility] Progress Note Patient Name: Crystal [name] DOB: Nov 12, 1934 MRN: [id number]   Date of Service  02/24/2020  HPI/Events of Note  BP 169/47, MAP 84, HR 58  eICU Interventions  PRN Hydralazine ordered for SBP > 150 mmHg.        [name] [name] 02/24/2020, 4:24 AM

## 2020-02-24 NOTE — Progress Notes (Addendum)
NAME:  Crystal [name], MRN:  [id number], DOB:  [birth year], LOS: 33 ADMISSION DATE:  02/12/2020, CONSULTATION DATE:  02/23/20 REFERRING MD:  Radford Pax, CHIEF COMPLAINT:  SOB   Brief History   86 year old woman with hx of ischemic cardiomyopathy presenting with decompensated heart failure requiring ICU transfer.  History of present illness   86 year old woman with history of multiple ACS s/p PCIs presenting with worsening SOB and elevated troponin.  She was taken for L/R heart catheterizations showing mildly elevated filling pressures, normal CI, and no evidence of recurrent ACS.  Hospital stay complicated by Afib/RVR requiring amiodarone and acute on chronic renal injury.  Initially given lasix worsening creatinine then given fluids worsening breathing status.  Breathing worsened this am and she was brought to ICU on BIPAP.  She complains of anxiety, PND, leg swelling, and inability to take in deep breaths.  CXR with edema and pleural effusions.  Past Medical History  T2DM HLD GERD HTN CHF CAD Distant breast ca  Significant Hospital Events   1/17 admitted 1/27 icu transfer  Consults:  Cards/Nephro/CCM  Procedures:   Patient Lines/Drains/Airways Status    Active Line/Drains/Airways    Name Placement date Placement time Site Days   Peripheral IV 02/13/20 Left Forearm 02/13/20  1055  Forearm  10          1/27 R and L sided thoracenteses, 843ml fluid drained from each pleural space     Significant Diagnostic Tests:  1/17 [facility]  Left Anterior Descending  Non-stenotic Mid LAD lesion was previously treated.  First Diagonal Branch  Vessel is moderate in size.  Left Circumflex  Vessel is large.  Prox Cx to Mid Cx lesion is 20% stenosed.  Right Coronary Artery  Vessel is large.  Non-stenotic Mid RCA lesion was previously treated. Vessel is the culprit lesion. The lesion is irregular and ulcerative.  Dist RCA lesion is 20% stenosed.   RHC: PA 44/14, wedge 18, CI 3.3  fick  Echo 1/21 1. Compared with the echo 73/7106, systolic function is worse and the  pleural effusion is new.  2. Hypokinesis of the basal to mid anteroseptal, inferoseptal and  inferior myocardium. Left ventricular ejection fraction, by estimation, is  40 to 45%. The left ventricle has mildly decreased function. The left  ventricle demonstrates regional wall  motion abnormalities (see scoring diagram/findings for description). Left  ventricular diastolic parameters are consistent with Grade II diastolic  dysfunction (pseudonormalization). Elevated left ventricular end-diastolic  pressure.   1/28 CXR>>  Micro Data:  MRSA PCR neg COVID neg  Antimicrobials:  none   Interim history/subjective:  Asleep, snoring, 0.5L [place] with spO2 97%  When awakened she states she Feels "so much better" after bilateral thoras. States she had the best sleep of her hospitalization last night   I turned off [place]. SpO2 remains 96-98%   Made aware that pt has DmII but has not had glucose coverage. Last CBG 170s    Objective   Blood pressure (!) 150/44, pulse (!) 58, temperature 98 F (36.7 C), temperature source Oral, resp. rate 17, height 5\' 5"  (1.651 m), weight 65.6 kg, SpO2 96 %.        Intake/Output Summary (Last 24 hours) at 02/24/2020 0725 Last data filed at 02/24/2020 0300 Gross per 24 hour  Intake --  Output 1100 ml  Net -1100 ml   Filed Weights   02/22/20 0459 02/23/20 0500 02/24/20 0500  Weight: 63.1 kg 67.2 kg 65.6 kg  Examination: Constitutional: elderly F, asleep in bed, snoring NAD  Neuro: awakens to voice. AAOx4, following commands PERRLA HEENT: NCAT pink mmm trachea midline  Cardiovascular: rrr s1s2 cap refill < 3sec Respiratory: Symmetrical chest expansion, even unlabored respirations on RA. Bilateral breath sounds, no crackles, rhonchi or wheezing.  Gastrointestinal: Soft ndnt Skin: pale, c/d/w. No rash  Resolved Hospital Problem list   n/a  Assessment & Plan:    Acute hypoxemic respiratory failure-- improving. due to acute on chronic pulm edema, bilateral pleural effusions in setting of acute on chronic systolic and diastolic HF -s/p bilateral thoracenteses 1/27 -- 840ml yielded from each pleural space with immediate improvement -discussed effusions with pt, she was seemingly unaware that HF could cause this. Discussed complex balance of chronic illness w pt and interrelatedness of organ systems and medications  P -CXR 1/28 AM, follow up -goal SpO2 > 94%-- weaned off [place] 1/28 AM -IS, mobility.  -diuresis per cardiology  -did note some snoring, could consider PRN BiPAP at night if c/f sleep disordered breathing   Acute on Chronic systolic and diastolic HF Afib P -diuresis, Goal directed tx per cards   AKI on CKD IIIb vs IV  -contrast nephropathy, cardiorenal, hemodynamic changes  Hyponatremia -ADH dysregulation due to HF P -nephro following  DM II -variable glu.  -Is NPO 1/28 P -very sensitive SSI q4hr    Thank you for consulting PCCM. Respiratory status has greatly improved following thoracenteses 1/27. Discussed with primary team-- PCCM will sign off. Please re-engage if patient status changes or if we can be of further assistance    Best practice:  Diet: NPO Pain/Anxiety/Delirium protocol (if indicated): cautious benzos PRN VAP protocol (if indicated): n/a DVT prophylaxis: per primary GI prophylaxis: PPI Glucose control: n/a Mobility: BR for now Code Status: per primary Family Communication: per primary Disposition: In ICU -- stable for transfer out of ICU from pccm perspective. dispo per primary team    Labs   CBC: Recent Labs  Lab 02/20/20 0100 02/21/20 0252 02/22/20 0207 02/23/20 0350 02/24/20 0036  WBC 7.5 7.7 7.2 6.5 6.9  HGB 8.9* 9.0* 9.3* 9.4* 9.7*  HCT 26.9* 27.4* 27.3* 28.8* 30.3*  MCV 82.8 83.8 83.0 84.0 84.9  PLT 242 261 270 275 010    Basic Metabolic Panel: Recent Labs  Lab 02/20/20 0100  02/21/20 0252 02/22/20 0207 02/23/20 0350 02/24/20 0036  NA 126* 128* 129* 129* 133*  K 3.2* 3.5 4.0 3.9 3.7  CL 93* 93* 94* 96* 96*  CO2 21* 23 24 24 26   GLUCOSE 113* 103* 111* 121* 104*  BUN 52* 54* 51* 43* 43*  CREATININE 3.26* 2.99* 2.51* 2.16* 2.11*  CALCIUM 8.4* 8.6* 8.5* 8.6* 8.6*  MG  --   --   --   --  2.2   GFR: Estimated Creatinine Clearance: 18.2 mL/min (A) (by C-G formula based on SCr of 2.11 mg/dL (H)). Recent Labs  Lab 02/21/20 0252 02/22/20 0207 02/23/20 0350 02/24/20 0036  WBC 7.7 7.2 6.5 6.9    Liver Function Tests: Recent Labs  Lab 02/18/20 0225 02/19/20 0231  ALBUMIN 2.8* 3.6   No results for input(s): LIPASE, AMYLASE in the last 168 hours. No results for input(s): AMMONIA in the last 168 hours.  ABG    Component Value Date/Time   PHART 7.476 (H) 02/13/2020 1428   PCO2ART 34.3 02/13/2020 1428   PO2ART 132 (H) 02/13/2020 1428   HCO3 26.3 02/13/2020 1437   TCO2 27 02/13/2020 1437   O2SAT 63.0  02/13/2020 1437     Coagulation Profile: No results for input(s): INR, PROTIME in the last 168 hours.  Cardiac Enzymes: No results for input(s): CKTOTAL, CKMB, CKMBINDEX, TROPONINI in the last 168 hours.  HbA1C: Hgb A1c MFr Bld  Date/Time Value Ref Range Status  01/16/2020 06:42 PM 6.0 (H) 4.8 - 5.6 % Final    Comment:    (NOTE) Pre diabetes:          5.7%-6.4%  Diabetes:              >6.4%  Glycemic control for   <7.0% adults with diabetes   02/28/2019 05:32 AM 6.1 (H) 4.8 - 5.6 % Final    Comment:    (NOTE) Pre diabetes:          5.7%-6.4% Diabetes:              >6.4% Glycemic control for   <7.0% adults with diabetes     CBG: Recent Labs  Lab 02/22/20 1711 02/22/20 2212 02/23/20 0750 02/23/20 1525 02/23/20 2117  GLUCAP 199* 140* 155* 159* 116*    CCT: n/a  Eliseo Gum MSN, AGACNP-BC [place] [phone] If no answer, [phone] 02/24/2020, 8:24 AM

## 2020-02-24 NOTE — Progress Notes (Signed)
[place] Progress Note Patient Name: SYLA DEVOSS DOB: 13-Nov-1934 MRN: [id number]   Date of Service  02/24/2020  HPI/Events of Note  BP 162/50, MAP 83.  eICU Interventions  Hydralazine 5 mg iv x 1 ordered.        Frederik Pear 02/24/2020, 8:39 PM

## 2020-02-24 NOTE — Progress Notes (Signed)
Physical Therapy Treatment Patient Details Name: Crystal [name] MRN: [id number] DOB: 07-06-1934 Today's Date: 02/24/2020    History of Present Illness Pt adm with NSTEMI and acute on chronic heart failure. Worsening breathing and A-fib with RVR requiring BiPAP resulted in tx to ICU on 1/27, now s/p thoracentesis 1/27. PMH - CAD, PCI 12/2019, STEMI 01/2019, HTN, ckd, breast CA    PT Comments    Patient progressing slowly towards PT goals. Reports breathing feels better today but pt reports feeling weaker in LEs. Tolerated gait training with Min A for balance/safety with use of RW for support; noted to have 2 LOB requiring Min A to correct and prevent fall. 2/4 DOE noted. Pt with drop in BP post session but asymptomatic. Noted to have swelling in bil feet. Encouraged ankle pumps and elevation. Will continue to follow and progress as tolerated.    Follow Up Recommendations  Home health PT;Supervision for mobility/OOB     Equipment Recommendations  Other (comment) (rollator)    Recommendations for Other Services       Precautions / Restrictions Precautions Precautions: Fall;Other (comment) Precaution Comments: watch BP Restrictions Weight Bearing Restrictions: No    Mobility  Bed Mobility Overal bed mobility: Needs Assistance Bed Mobility: Supine to Sit     Supine to sit: Min guard     General bed mobility comments: Min guard for safety, increased time and use of rail. No dizziness.  Transfers Overall transfer level: Needs assistance Equipment used: Rolling walker (2 wheeled) Transfers: Sit to/from Stand Sit to Stand: Min assist         General transfer comment: Min A to steady in standing with cues for hand placement as pt pulling up on RW. Transferred to chair post ambulation.  Ambulation/Gait Ambulation/Gait assistance: Min assist Gait Distance (Feet): 100 Feet Assistive device: Rolling walker (2 wheeled) Gait Pattern/deviations: Step-through  pattern;Decreased stride length Gait velocity: decr Gait velocity interpretation: <1.8 ft/sec, indicate of risk for recurrent falls General Gait Details: Slow, mildly unsteady gait with 2 LOB posteriorly needing Min A to correct. Reports feeling weakness in BLEs.   Stairs             Wheelchair Mobility    Modified Rankin (Stroke Patients Only)       Balance Overall balance assessment: Needs assistance Sitting-balance support: No upper extremity supported;Feet supported Sitting balance-Leahy Scale: Good Sitting balance - Comments: Able to donn socks sitting EOB with some difficulty and assist bringing left foot into thigh, no LOB.   Standing balance support: During functional activity Standing balance-Leahy Scale: Poor Standing balance comment: Requires UE support on RW.                            Cognition Arousal/Alertness: Awake/alert Behavior During Therapy: Flat affect Overall Cognitive Status: Within Functional Limits for tasks assessed                                        Exercises      General Comments General comments (skin integrity, edema, etc.): BP pre actitivty 147/119, not sure of accuracy, post activity BP 97/72. No dizziness. 2/4 DOE.      Pertinent Vitals/Pain Pain Assessment: No/denies pain    Home Living                      Prior  Function            PT Goals (current goals can now be found in the care plan section) Progress towards PT goals: Progressing toward goals    Frequency    Min 3X/week      PT Plan Current plan remains appropriate    Co-evaluation              AM-PAC PT "6 Clicks" Mobility   Outcome Measure  Help needed turning from your back to your side while in a flat bed without using bedrails?: None Help needed moving from lying on your back to sitting on the side of a flat bed without using bedrails?: A Little Help needed moving to and from a bed to a chair  (including a wheelchair)?: A Little Help needed standing up from a chair using your arms (e.g., wheelchair or bedside chair)?: A Little Help needed to walk in hospital room?: A Little Help needed climbing 3-5 steps with a railing? : A Little 6 Click Score: 19    End of Session Equipment Utilized During Treatment: Gait belt Activity Tolerance: Patient tolerated treatment well;Patient limited by fatigue Patient left: in chair;with call bell/phone within reach Nurse Communication: Mobility status PT Visit Diagnosis: Unsteadiness on feet (R26.81);Other abnormalities of gait and mobility (R26.89);Muscle weakness (generalized) (M62.81)     Time: 5631-4970 PT Time Calculation (min) (ACUTE ONLY): 21 min  Charges:  $Gait Training: 8-22 mins                     Marisa Severin, PT, DPT Acute Rehabilitation Services Pager [phone] Office [phone]       [place] 02/24/2020, 12:28 PM

## 2020-02-24 NOTE — Progress Notes (Addendum)
Progress Note  Patient Name: Crystal [name] Date of Encounter: 02/24/2020  [facility] Cardiologist: Rozann Lesches, MD   Subjective  Pt says her breathing is OK in bed  No CP   Inpatient Medications    Scheduled Meds: . amiodarone  400 mg Oral BID  . apixaban  2.5 mg Oral BID  . calcium-vitamin D  1 tablet Oral QAC lunch  . carvedilol  6.25 mg Oral BID WC  . Chlorhexidine Gluconate Cloth  6 each Topical Daily  . cholecalciferol  1,000 Units Oral Q breakfast  . clopidogrel  75 mg Oral q morning - 10a  . ferrous sulfate  325 mg Oral QAC lunch  . hydrALAZINE  100 mg Oral Q8H  . isosorbide mononitrate  60 mg Oral QHS  . pantoprazole  40 mg Oral Daily  . polycarbophil  625 mg Oral Daily  . rosuvastatin  2.5 mg Oral Q48H  . saccharomyces boulardii  250 mg Oral BID  . sodium chloride flush  3 mL Intravenous Q12H   Continuous Infusions: . sodium chloride     PRN Meds: sodium chloride, acetaminophen, alum & mag hydroxide-simeth, bisacodyl, diazepam, doxepin, hydrALAZINE, magnesium hydroxide, nitroGLYCERIN, ondansetron (ZOFRAN) IV   Vital Signs    Vitals:   02/24/20 0300 02/24/20 0400 02/24/20 0500 02/24/20 0600  BP: (!) 166/49 (!) 169/47 (!) 158/48 (!) 164/48  Pulse: (!) 55 (!) 54 (!) 56 (!) 58  Resp: 14 13 16 16   Temp:  98 F (36.7 C)    TempSrc:  Oral    SpO2: 99% 91% 97% 95%  Weight:   65.6 kg   Height:        Intake/Output Summary (Last 24 hours) at 02/24/2020 0657 Last data filed at 02/24/2020 0300 Gross per 24 hour  Intake --  Output 1100 ml  Net -1100 ml    Net neg 1.3 L   Last 3 Weights 02/24/2020 02/23/2020 02/22/2020  Weight (lbs) 144 lb 10 oz 148 lb 1.6 oz 139 lb 1.8 oz  Weight (kg) 65.6 kg 67.178 kg 63.1 kg      Telemetry    PT converted to SR last evening at 6 PM   ECG    No new ECG tracing today. - Personally Reviewed  Physical Exam   GEN: No acute distress.   Neck: JVP approx 9-10 Cardiac: RRR. No murmurs, rubs, or gallops.  Radial and distal pedal pulses 2+ and equal bilaterally. Respiratory: Rales at L base  GI: Soft, non-distended, and non-tender. Bowel sounds present. MS: Trace LE edema. No deformity. Skin: Warm and dry. Neuro:  No focal deficits. Psych: Normal affect.   Labs    High Sensitivity Troponin:   Recent Labs  Lab 02/12/20 1726 02/12/20 2300 02/13/20 1126 02/13/20 1330  TROPONINIHS 254* 1,735* 1,229* 1,055*      Chemistry Recent Labs  Lab 02/18/20 0225 02/19/20 0231 02/20/20 0100 02/22/20 0207 02/23/20 0350 02/24/20 0036  NA 127* 127*   < > 129* 129* 133*  K 4.7 4.3   < > 4.0 3.9 3.7  CL 96* 96*   < > 94* 96* 96*  CO2 19* 18*   < > 24 24 26   GLUCOSE 116* 98   < > 111* 121* 104*  BUN 53* 56*   < > 51* 43* 43*  CREATININE 3.47* 3.29*   < > 2.51* 2.16* 2.11*  CALCIUM 8.4* 8.7*   < > 8.5* 8.6* 8.6*  ALBUMIN 2.8* 3.6  --   --   --   --  GFRNONAA 13* 13*   < > 19* 22* 23*  ANIONGAP 12 13   < > 11 9 11    < > = values in this interval not displayed.     Hematology Recent Labs  Lab 02/22/20 0207 02/23/20 0350 02/24/20 0036  WBC 7.2 6.5 6.9  RBC 3.29* 3.43* 3.57*  HGB 9.3* 9.4* 9.7*  HCT 27.3* 28.8* 30.3*  MCV 83.0 84.0 84.9  MCH 28.3 27.4 27.2  MCHC 34.1 32.6 32.0  RDW 16.4* 16.4* 16.4*  PLT 270 275 292    BNP Recent Labs  Lab 02/18/20 0225 02/23/20 0350  BNP 2,867.6* 3,507.5*     DDimer No results for input(s): DDIMER in the last 168 hours.   Radiology    DG Chest 1 View  Result Date: 02/23/2020 CLINICAL DATA:  Status post thoracentesis. EXAM: CHEST  1 VIEW COMPARISON:  02/23/2020 FINDINGS: Heart size is within normal limits. Mild pulmonary vascular congestion. Blunted left lateral costophrenic angle likely due to small pleural effusion. Exam is limited due to exclusion of the lung apices and multiple overlying support structures. IMPRESSION: 1. Interval decrease of bilateral pleural effusions. Small effusion limb aimed on the left. 2. Exam is limited due  to multiple overlying support structures as well as exclusion of the lung apices. Very small apical pneumothorax is not excluded. Electronically Signed   By: Miachel Roux M.D.   On: 02/23/2020 11:36   DG CHEST PORT 1 VIEW  Result Date: 02/23/2020 CLINICAL DATA:  Worsening shortness of breath over the last 2 days. EXAM: PORTABLE CHEST 1 VIEW COMPARISON:  02/17/2020 FINDINGS: Worsened congestive heart failure pattern. Cardiomegaly. Aortic atherosclerosis. Pulmonary venous hypertension. Interstitial pulmonary edema. Enlarging bilateral effusions with associated lower lobe atelectasis. IMPRESSION: Worsening congestive heart failure pattern. Enlarging bilateral effusions with associated lower lobe atelectasis. Electronically Signed   By: Nelson Chimes M.D.   On: 02/23/2020 08:39    Cardiac Studies   Right/Left Cardiac Catheterization 02/13/2020:   Non-stenotic Mid LAD lesion was previously treated.  Prox Cx to Mid Cx lesion is 20% stenosed.  Dist RCA lesion is 20% stenosed.  Non-stenotic Mid RCA lesion was previously treated.  Hemodynamic findings consistent with mild pulmonary hypertension.  LV end diastolic pressure is mildly elevated.   1. Nonobstructive CAD. Continued excellent patency of stents in the LAD and RCA 2. Mildly elevated LV filling pressures 3. Mildly elevated right heart pressures. 4. Normal cardiac output  Plan: continue medical therapy. Diuresis. BP control.  Diagnostic Dominance: Right     _______________  Echocardiogram 02/17/2020: Impression 1. Compared with the echo 96/2952, systolic function is worse and the  pleural effusion is new.  2. Hypokinesis of the basal to mid anteroseptal, inferoseptal and  inferior myocardium. Left ventricular ejection fraction, by estimation, is  40 to 45%. The left ventricle has mildly decreased function. The left  ventricle demonstrates regional wall  motion abnormalities (see scoring diagram/findings for description).  Left  ventricular diastolic parameters are consistent with Grade II diastolic  dysfunction (pseudonormalization). Elevated left ventricular end-diastolic  pressure.  3. Moderate pleural effusion in the left lateral region.  4. Mild to moderate mitral valve regurgitation.  5. Tricuspid valve regurgitation is moderate.  6. The aortic valve is tricuspid. Aortic valve regurgitation is mild.  7. There is moderately elevated pulmonary artery systolic pressure.  8. The inferior vena cava is dilated in size with <50% respiratory  variability, suggesting right atrial pressure of 15 mmHg.   Patient Profile  Ms. Korte is a 86 y.o. female with a history of history of CAD with recent STEMI in 01/2019 s/p DES to mid LAD and then recent NSTEMI in 12/2019 s/p DES to RCA, chronic systolic CHF with improvement of EF to 50-55% on Echo in 12/2019, hypertension, hyperlipidemia, type 2 diabetes, and CKD III-IV who presented with exertional shortness of breath and found to have elevated troponin concerning for NSTEMI.   Assessment & Plan    Dyspnea Acute on Chronic Combined CHF .  - Right/left cardiac catheterization showed patent stents with mildly elevated LV filling pressures, mildly elevated right heart pressures, and normal cardiac output. - Echo showed LVEF 40-45% with hypokinesis of basal to mid anteroseptal, inferoseptal, and inferior myocardium. -lasix dosing has been varied since admit based on renal function  Yesterday got IV lasix and PO    Wt down but does not match I/O Clinically pt with mild volume increase   CXR is not bad  Pt comfortable   Would give daily PO lasix as recomm by renal  20 mg   Follow response     - No ACEI/ARB/ARNI due to renal function. - Continue beta-blocker.   New Onset Atrial Fibrillation/Flutter Pt went back into afib yesterday  Then converted to SR last evening  - Now on PO Amiodarone. Plan is for 400mg  twice daily x1 week >> 400mg  once daily x 2 weeks >>  200mg  once daily. - Continue Eliquis 2.5mg  twice daily (reduced dose given age and renal function).  Demand Ischemia - Patient presented with exertional dyspnea and found to have elevated troponin - High-sensitivity troponin peaked at 1,735.  - R/LHC on 02/13/2019 showed patent stents to LAD and RCA and otherwise non-obstructive CAD. - Likely demand ischemia from CHF and atrial fibrillation. Pt without CP   Hypertension BP remains elevated 140s to 160s/  She is on more hydralazine than prior to admit  but less carvedilol than prior to admit   ALso on lasix again Review of records, amlodipine lead to edema thoug hquestion dose this occurred  Will increase imdur to 90   Follow   Hyperlipidemia - Lipid panel this admission: Total Cholesterol 155, Triglycerides 74, HDL 57, LDL 83. - LDL goal <70 given CAD. - Prior statin intolerance to Pitavastatin. PCSK9 inhibitor too expensive. - Patient agreeable to try Crestor 2.5mg  every other day.  Iron Deficiency Anemia Hemoccult Positive - Hemoglobin nadir 7.9 this admission on on 1/19.  It is 9,7 today   - Hemoccult was positive. - GI was consulted. Anemia felt to most likely be secondary to CKD. Endoscopic evaluation not recommended given active cardiac issues. Aspirin was stopped.   They were OK with Eliquis being resumed  . Recommended continuing Protonix 40mg  indefinitely. GI signed off on 02/16/2020. Need to review use of Plavix in this setting as well  Patent stents at cath this admit   - Will need follow-up with primary GI physician (Dr. Gala Romney) in 4-6 weeks.   Acute on CKD Stage III - Creatinine 1.66 on admission and peaked at 3.47 on 1/22. Baseline creatinine 1.3 to 1.9.  Today it is 2.11 - Renal ultrasound unremarkable. - Urinalysis with minimal protein and no bacteria or RBC. - Bladder scan showed no urinary retention. - Nephrology consulted. Felt to be due to contrast nephropathy in association with hemodynamic changes related to atrial  fibrillation with RVR and CHF/cardiorenal syndrome. Nephrology signed off on 1/26 and will follow-up as outpatient. They felt a trial of 20 mg daily  with daily wts and labs   WOuld continue today   Hyponatremia - Sodium 129 on admission. Initially improved with diuresis and then dropped again as low as 126 on 1/24. Starting to come up again.  Imprved at 133 this am   - Monitor closely now that we are restarting home diuretics. - Continue fluid restriction.  PT to evaluate today   Incentive spirometry ordered Will keep on unit today until up a bit   Close to [place], can move if bed needed  For questions or updates, please contact [facility] Please consult [email or web address] for contact info under        Signed, Dorris Carnes, MD  02/24/2020, 6:57 AM

## 2020-02-25 DIAGNOSIS — I214 Non-ST elevation (NSTEMI) myocardial infarction: Secondary | ICD-10-CM | POA: Diagnosis not present

## 2020-02-25 DIAGNOSIS — I480 Paroxysmal atrial fibrillation: Secondary | ICD-10-CM | POA: Diagnosis not present

## 2020-02-25 LAB — CBC
HCT: 29.8 % — ABNORMAL LOW (ref 36.0–46.0)
Hemoglobin: 9.8 g/dL — ABNORMAL LOW (ref 12.0–15.0)
MCH: 27.4 pg (ref 26.0–34.0)
MCHC: 32.9 g/dL (ref 30.0–36.0)
MCV: 83.2 fL (ref 80.0–100.0)
Platelets: 281 10*3/uL (ref 150–400)
RBC: 3.58 MIL/uL — ABNORMAL LOW (ref 3.87–5.11)
RDW: 16.3 % — ABNORMAL HIGH (ref 11.5–15.5)
WBC: 6.8 10*3/uL (ref 4.0–10.5)
nRBC: 0 % (ref 0.0–0.2)

## 2020-02-25 LAB — BASIC METABOLIC PANEL
Anion gap: 11 (ref 5–15)
BUN: 45 mg/dL — ABNORMAL HIGH (ref 8–23)
CO2: 24 mmol/L (ref 22–32)
Calcium: 8.2 mg/dL — ABNORMAL LOW (ref 8.9–10.3)
Chloride: 93 mmol/L — ABNORMAL LOW (ref 98–111)
Creatinine, Ser: 1.95 mg/dL — ABNORMAL HIGH (ref 0.44–1.00)
GFR, Estimated: 25 mL/min — ABNORMAL LOW (ref >60)
Glucose, Bld: 95 mg/dL (ref 70–99)
Potassium: 3.9 mmol/L (ref 3.5–5.1)
Sodium: 128 mmol/L — ABNORMAL LOW (ref 135–145)

## 2020-02-25 LAB — GLUCOSE, CAPILLARY
Glucose-Capillary: 89 mg/dL (ref 70–99)
Glucose-Capillary: 121 mg/dL — ABNORMAL HIGH (ref 70–99)
Glucose-Capillary: 135 mg/dL — ABNORMAL HIGH (ref 70–99)
Glucose-Capillary: 170 mg/dL — ABNORMAL HIGH (ref 70–99)
Glucose-Capillary: 137 mg/dL — ABNORMAL HIGH (ref 70–99)
Glucose-Capillary: 173 mg/dL — ABNORMAL HIGH (ref 70–99)

## 2020-02-25 LAB — MAGNESIUM: Magnesium: 2 mg/dL (ref 1.7–2.4)

## 2020-02-25 MED ORDER — CARVEDILOL 25 MG PO TABS
25.0000 mg | ORAL_TABLET | Freq: Two times a day (BID) | ORAL | Status: DC
  Administered 2020-02-25 – 2020-03-04 (×16): 25 mg via ORAL
  Filled 2020-02-25 (×17): qty 1

## 2020-02-25 MED ORDER — FUROSEMIDE 40 MG PO TABS
40.0000 mg | ORAL_TABLET | Freq: Every day | ORAL | Status: DC
  Administered 2020-02-26: 40 mg via ORAL
  Filled 2020-02-25: qty 1

## 2020-02-25 MED ORDER — FUROSEMIDE 20 MG PO TABS
20.0000 mg | ORAL_TABLET | Freq: Once | ORAL | Status: AC
  Administered 2020-02-25: 20 mg via ORAL
  Filled 2020-02-25: qty 1

## 2020-02-25 MED ORDER — METOPROLOL TARTRATE 5 MG/5ML IV SOLN
2.5000 mg | Freq: Once | INTRAVENOUS | Status: AC
  Administered 2020-02-25: 2.5 mg via INTRAVENOUS
  Filled 2020-02-25: qty 5

## 2020-02-25 MED ORDER — CARVEDILOL 12.5 MG PO TABS
12.5000 mg | ORAL_TABLET | Freq: Two times a day (BID) | ORAL | Status: DC
Start: 2020-02-25 — End: 2020-02-25
  Administered 2020-02-25: 12.5 mg via ORAL
  Filled 2020-02-25: qty 1

## 2020-02-25 MED ORDER — ATROPINE SULFATE 1 MG/10ML IJ SOSY
PREFILLED_SYRINGE | INTRAMUSCULAR | Status: AC
  Filled 2020-02-25: qty 10

## 2020-02-25 NOTE — Progress Notes (Addendum)
[facility] NEPHROLOGY PROGRESS NOTE  Assessment/ Plan: Pt is a 86 y.o. yo female  with history of hypertension, HLD, CKD 3 with baseline creatinine level around 1.3-1.9, chronic combined CHF, CAD who was admitted on 1/17 for acute CHF exacerbation and an NSTEMI, seen as a consultation for acute kidney injury on CKD.  #Acute kidney injury on CKD stage IIIb/IV: Likely due to contrast nephropathy in association with hemodynamic changes related with A. fib with RVR, CHF/cardiorenal syndrome. UA with minimal protein but has bacteria, no RBC. Reportedly bladder scan with no urinary retention.  The kidney ultrasound ruled out obstruction.   Creatinine is now improved from peak of 3.5 on 1/22 to 1.95 today even with diuresis yesterday.  Avoid nephrotoxins or hypotensive episode.  No need for dialysis.   Arranging outpt nephrology follow up in clinic.   #Acute CHF exacerbation: She's quite sensitive and has required diuretics, fluids, diuretics.  1/27 HTN driven flash pulmonary edema - lasix 40 IV with fair diuresis; repeated dose yesterday given persistent rales.  Change to lasix 40 po daily; prior dose was 20 daily but she still has a bit of ankle edema.  Daily weights, 2g sodium, fluid restriction.  #Hyponatremia, hypervolemic: has been high 120s - low 130s, 128 today.  Cont to trend.   # NSTEMI: Status post cardiac cath with stent placement in RCA and LAD.  On Plavix.  No Eliquis or aspirin due to GI bleed initially but has been resumed now with new a fib.  #Anemia with stool occult blood positive:  Anticoag was held but resumed at lower dose 1/23.   Treated with IV iron.  Monitor hemoglobin, currently stable in 9s.  #A. Fib/AFl with RVR: On amiodarone and cored with cardiology managing.  Low dose eliquis added.   #Hypertension: much improved in the past 12h with recent med titrations - coreg was further titrated today.  Renal artery duplex neg for high grade RAS.  Will  follow - page with issues.     Subjective:  AFib/Flutter noted overnight and she said she tolerates it poorly - palpitations make her nervous.  UOP 1.6L yesterday with lasix 40 IV; net neg 2.5L for admission now.  Objective Vital signs in last 24 hours: Vitals:   02/25/20 0700 02/25/20 0800 02/25/20 0813 02/25/20 1126  BP: (!) 163/54 (!) 144/63 (!) 144/63   Pulse: (!) 101 97 (!) 102   Resp: 14 18    Temp: 98.4 F (36.9 C)   98.1 F (36.7 C)  TempSrc:      SpO2: 95% 97%    Weight:      Height:       Weight change: -4.7 kg  Intake/Output Summary (Last 24 hours) at 02/25/2020 1140 Last data filed at 02/25/2020 0600 Gross per 24 hour  Intake 243 ml  Output 1480 ml  Net -1237 ml       Labs: Basic Metabolic Panel: Recent Labs  Lab 02/23/20 0350 02/24/20 0036 02/25/20 0046  NA 129* 133* 128*  K 3.9 3.7 3.9  CL 96* 96* 93*  CO2 24 26 24   GLUCOSE 121* 104* 95  BUN 43* 43* 45*  CREATININE 2.16* 2.11* 1.95*  CALCIUM 8.6* 8.6* 8.2*   Liver Function Tests: Recent Labs  Lab 02/19/20 0231  ALBUMIN 3.6   No results for input(s): LIPASE, AMYLASE in the last 168 hours. No results for input(s): AMMONIA in the last 168 hours. CBC: Recent Labs  Lab 02/21/20 0252 02/22/20 0207 02/23/20 0350  02/24/20 0036 02/25/20 0046  WBC 7.7 7.2 6.5 6.9 6.8  HGB 9.0* 9.3* 9.4* 9.7* 9.8*  HCT 27.4* 27.3* 28.8* 30.3* 29.8*  MCV 83.8 83.0 84.0 84.9 83.2  PLT 261 270 275 292 281   Cardiac Enzymes: No results for input(s): CKTOTAL, CKMB, CKMBINDEX, TROPONINI in the last 168 hours. CBG: Recent Labs  Lab 02/24/20 2131 02/24/20 2352 02/25/20 0348 02/25/20 0636 02/25/20 1125  GLUCAP 122* 91 89 121* 135*    Iron Studies: No results for input(s): IRON, TIBC, TRANSFERRIN, FERRITIN in the last 72 hours. Studies/Results: DG CHEST PORT 1 VIEW  Result Date: 02/24/2020 CLINICAL DATA:  Evaluate for pleural effusion. EXAM: PORTABLE CHEST 1 VIEW COMPARISON:  02/23/2020 FINDINGS: Cardiac  pads have been removed from the chest. Persistent densities at the left lung base. Heart size is within normal limits and stable. Right lung is clear. Probable scarring at the lung apices. Atherosclerotic calcifications at the aortic arch. IMPRESSION: Residual densities at the left lung base. Findings could represent consolidation or small effusion. Electronically Signed   By: Markus Daft M.D.   On: 02/24/2020 08:28   VAS US RENAL ARTERY DUPLEX  Result Date: 02/24/2020 ABDOMINAL VISCERAL Indications: Hypertension emergency High Risk Factors: Hypertension, hyperlipidemia, Diabetes, coronary artery                    disease. Limitations: Air/bowel gas. Performing Technologist: Oda Cogan RDMS, RVT  Examination Guidelines: A complete evaluation includes B-mode imaging, spectral Doppler, color Doppler, and power Doppler as needed of all accessible portions of each vessel. Bilateral testing is considered an integral part of a complete examination. Limited examinations for reoccurring indications may be performed as noted.  Duplex Findings: +--------------------+--------+--------+------+--------+ Mesenteric          PSV cm/sEDV cm/sPlaqueComments +--------------------+--------+--------+------+--------+ Aorta Prox            108                 Stenotic +--------------------+--------+--------+------+--------+ Aorta Distal          262                 stenotic +--------------------+--------+--------+------+--------+ Celiac Artery Origin  239                          +--------------------+--------+--------+------+--------+ SMA Proximal          221                          +--------------------+--------+--------+------+--------+   +------------------+--------+--------+-----------------+ Right Renal ArteryPSV cm/sEDV cm/s     Comment      +------------------+--------+--------+-----------------+ Origin              137      19   poorly visualized  +------------------+--------+--------+-----------------+ Proximal            109      22                     +------------------+--------+--------+-----------------+ Mid                 124      17                     +------------------+--------+--------+-----------------+ Distal              103      17                     +------------------+--------+--------+-----------------+ +-----------------+--------+--------+-------+  Left Renal ArteryPSV cm/sEDV cm/sComment +-----------------+--------+--------+-------+ Origin             281      28           +-----------------+--------+--------+-------+ Proximal           157      17           +-----------------+--------+--------+-------+ Mid                 71      22           +-----------------+--------+--------+-------+ Distal              89      15           +-----------------+--------+--------+-------+  Technologist observations: Distal aorta was technically difficult to visualize due to overlying bowel gas. Waveforms appeared stenotic. +------------+--------+--------+----+-----------+--------+--------+----+ Right KidneyPSV cm/sEDV cm/sRI  Left KidneyPSV cm/sEDV cm/sRI   +------------+--------+--------+----+-----------+--------+--------+----+ Upper Pole  29      6       0.79Upper Pole 28      9       0.68 +------------+--------+--------+----+-----------+--------+--------+----+ Mid         21      12      0.43Mid        29      12      0.60 +------------+--------+--------+----+-----------+--------+--------+----+ Lower Pole  21      10      0.54Lower Pole 28      10      0.63 +------------+--------+--------+----+-----------+--------+--------+----+ Hilar       37      7       0.80Hilar      27      9       0.65 +------------+--------+--------+----+-----------+--------+--------+----+ +------------------+-----+------------------+-----+ Right Kidney           Left Kidney              +------------------+-----+------------------+-----+ RAR                    RAR                     +------------------+-----+------------------+-----+ RAR (manual)      1.0  RAR (manual)      2.6   +------------------+-----+------------------+-----+ Cortex                 Cortex                  +------------------+-----+------------------+-----+ Cortex thickness       Corex thickness         +------------------+-----+------------------+-----+ Kidney length (cm)10.55Kidney length (cm)11.05 +------------------+-----+------------------+-----+  Summary: Renal:  Right: 1-59% stenosis of the right renal artery. Normal size right        kidney. Cyst(s) noted. Left:  Evidence 60 percent or less stenosis in the left renal artery        by velocities. Normal size of left kidney.  *See table(s) above for measurements and observations.  Diagnosing physician: Jamelle Haring  Electronically signed by Jamelle Haring on 02/24/2020 at 5:19:57 PM.    Final     Medications: Infusions: . sodium chloride      Scheduled Medications: . amiodarone  400 mg Oral BID  . apixaban  2.5 mg Oral BID  . atropine      . calcium-vitamin D  1 tablet Oral QAC lunch  . carvedilol  25 mg Oral BID  WC  . Chlorhexidine Gluconate Cloth  6 each Topical Daily  . cholecalciferol  1,000 Units Oral Q breakfast  . clopidogrel  75 mg Oral q morning - 10a  . ferrous sulfate  325 mg Oral QAC lunch  . furosemide  20 mg Oral Daily  . hydrALAZINE  100 mg Oral Q8H  . insulin aspart  0-6 Units Subcutaneous Q4H  . isosorbide mononitrate  90 mg Oral QHS  . pantoprazole  40 mg Oral Daily  . polycarbophil  625 mg Oral Daily  . rosuvastatin  2.5 mg Oral Q48H  . saccharomyces boulardii  250 mg Oral BID  . sodium chloride flush  3 mL Intravenous Q12H    have reviewed scheduled and prn medications.  Physical Exam: General:sitting in chair alert Heart: SR on monitor in 90s Lungs: clear ant, normal WOB at rest, rales in both  bases Abdomen:soft, Non-tender, non-distended Extremities: 1+ ankle edema worse than earlier in week Neurology: Osvaldo Human A Malaka Ruffner 02/25/2020,11:40 AM  LOS: 12 days

## 2020-02-25 NOTE — Progress Notes (Signed)
Progress Note  Patient Name: Crystal [name] Date of Encounter: 02/25/2020  [facility] Cardiologist: Rozann Lesches, MD   Subjective   Laying in bed, just brushed her teeth.  Less short of breath.  She is frustrated by her heart rate.  Inpatient Medications    Scheduled Meds: . amiodarone  400 mg Oral BID  . apixaban  2.5 mg Oral BID  . atropine      . calcium-vitamin D  1 tablet Oral QAC lunch  . carvedilol  12.5 mg Oral BID WC  . Chlorhexidine Gluconate Cloth  6 each Topical Daily  . cholecalciferol  1,000 Units Oral Q breakfast  . clopidogrel  75 mg Oral q morning - 10a  . ferrous sulfate  325 mg Oral QAC lunch  . furosemide  20 mg Oral Daily  . hydrALAZINE  100 mg Oral Q8H  . insulin aspart  0-6 Units Subcutaneous Q4H  . isosorbide mononitrate  90 mg Oral QHS  . pantoprazole  40 mg Oral Daily  . polycarbophil  625 mg Oral Daily  . rosuvastatin  2.5 mg Oral Q48H  . saccharomyces boulardii  250 mg Oral BID  . sodium chloride flush  3 mL Intravenous Q12H   Continuous Infusions: . sodium chloride     PRN Meds: sodium chloride, acetaminophen, alum & mag hydroxide-simeth, bisacodyl, diazepam, doxepin, hydrALAZINE, magnesium hydroxide, nitroGLYCERIN, ondansetron (ZOFRAN) IV   Vital Signs    Vitals:   02/25/20 0639 02/25/20 0700 02/25/20 0800 02/25/20 0813  BP: (!) 161/60 (!) 163/54 (!) 144/63 (!) 144/63  Pulse: (!) 103 (!) 101 97 (!) 102  Resp: (!) 27 14 18    Temp:  98.4 F (36.9 C)    TempSrc:      SpO2: 96% 95% 97%   Weight:      Height:        Intake/Output Summary (Last 24 hours) at 02/25/2020 1003 Last data filed at 02/25/2020 0600 Gross per 24 hour  Intake 243 ml  Output 1480 ml  Net -1237 ml   Last 3 Weights 02/25/2020 02/24/2020 02/23/2020  Weight (lbs) 134 lb 4.2 oz 144 lb 10 oz 148 lb 1.6 oz  Weight (kg) 60.9 kg 65.6 kg 67.178 kg      Telemetry    Atrial flutter with variable conduction heart rate 89 bpm, at night was 120- Personally  Reviewed  ECG    02/24/2020: Atrial fibrillation heart rate 115 bpm- Personally Reviewed  Physical Exam   GEN: No acute distress.   Neck: No JVD Cardiac: irreg, no murmurs, rubs, or gallops.  Respiratory: Clear to auscultation bilaterally. GI: Soft, nontender, non-distended  MS: No edema; No deformity. Neuro:  Nonfocal  Psych: Normal affect   Labs    High Sensitivity Troponin:   Recent Labs  Lab 02/12/20 1726 02/12/20 2300 02/13/20 1126 02/13/20 1330  TROPONINIHS 254* 1,735* 1,229* 1,055*      Chemistry Recent Labs  Lab 02/19/20 0231 02/20/20 0100 02/23/20 0350 02/24/20 0036 02/25/20 0046  NA 127*   < > 129* 133* 128*  K 4.3   < > 3.9 3.7 3.9  CL 96*   < > 96* 96* 93*  CO2 18*   < > 24 26 24   GLUCOSE 98   < > 121* 104* 95  BUN 56*   < > 43* 43* 45*  CREATININE 3.29*   < > 2.16* 2.11* 1.95*  CALCIUM 8.7*   < > 8.6* 8.6* 8.2*  ALBUMIN 3.6  --   --   --   --  GFRNONAA 13*   < > 22* 23* 25*  ANIONGAP 13   < > 9 11 11    < > = values in this interval not displayed.     Hematology Recent Labs  Lab 02/23/20 0350 02/24/20 0036 02/25/20 0046  WBC 6.5 6.9 6.8  RBC 3.43* 3.57* 3.58*  HGB 9.4* 9.7* 9.8*  HCT 28.8* 30.3* 29.8*  MCV 84.0 84.9 83.2  MCH 27.4 27.2 27.4  MCHC 32.6 32.0 32.9  RDW 16.4* 16.4* 16.3*  PLT 275 292 281    BNP Recent Labs  Lab 02/23/20 0350  BNP 3,507.5*     DDimer No results for input(s): DDIMER in the last 168 hours.   Radiology    DG Chest 1 View  Result Date: 02/23/2020 CLINICAL DATA:  Status post thoracentesis. EXAM: CHEST  1 VIEW COMPARISON:  02/23/2020 FINDINGS: Heart size is within normal limits. Mild pulmonary vascular congestion. Blunted left lateral costophrenic angle likely due to small pleural effusion. Exam is limited due to exclusion of the lung apices and multiple overlying support structures. IMPRESSION: 1. Interval decrease of bilateral pleural effusions. Small effusion limb aimed on the left. 2. Exam is  limited due to multiple overlying support structures as well as exclusion of the lung apices. Very small apical pneumothorax is not excluded. Electronically Signed   By: Miachel Roux M.D.   On: 02/23/2020 11:36   DG CHEST PORT 1 VIEW  Result Date: 02/24/2020 CLINICAL DATA:  Evaluate for pleural effusion. EXAM: PORTABLE CHEST 1 VIEW COMPARISON:  02/23/2020 FINDINGS: Cardiac pads have been removed from the chest. Persistent densities at the left lung base. Heart size is within normal limits and stable. Right lung is clear. Probable scarring at the lung apices. Atherosclerotic calcifications at the aortic arch. IMPRESSION: Residual densities at the left lung base. Findings could represent consolidation or small effusion. Electronically Signed   By: Markus Daft M.D.   On: 02/24/2020 08:28   VAS US RENAL ARTERY DUPLEX  Result Date: 02/24/2020 ABDOMINAL VISCERAL Indications: Hypertension emergency High Risk Factors: Hypertension, hyperlipidemia, Diabetes, coronary artery                    disease. Limitations: Air/bowel gas. Performing Technologist: Oda Cogan RDMS, RVT  Examination Guidelines: A complete evaluation includes B-mode imaging, spectral Doppler, color Doppler, and power Doppler as needed of all accessible portions of each vessel. Bilateral testing is considered an integral part of a complete examination. Limited examinations for reoccurring indications may be performed as noted.  Duplex Findings: +--------------------+--------+--------+------+--------+ Mesenteric          PSV cm/sEDV cm/sPlaqueComments +--------------------+--------+--------+------+--------+ Aorta Prox            108                 Stenotic +--------------------+--------+--------+------+--------+ Aorta Distal          262                 stenotic +--------------------+--------+--------+------+--------+ Celiac Artery Origin  239                           +--------------------+--------+--------+------+--------+ SMA Proximal          221                          +--------------------+--------+--------+------+--------+   +------------------+--------+--------+-----------------+ Right Renal ArteryPSV cm/sEDV cm/s     Comment      +------------------+--------+--------+-----------------+  Origin              137      19   poorly visualized +------------------+--------+--------+-----------------+ Proximal            109      22                     +------------------+--------+--------+-----------------+ Mid                 124      17                     +------------------+--------+--------+-----------------+ Distal              103      17                     +------------------+--------+--------+-----------------+ +-----------------+--------+--------+-------+ Left Renal ArteryPSV cm/sEDV cm/sComment +-----------------+--------+--------+-------+ Origin             281      28           +-----------------+--------+--------+-------+ Proximal           157      17           +-----------------+--------+--------+-------+ Mid                 71      22           +-----------------+--------+--------+-------+ Distal              89      15           +-----------------+--------+--------+-------+  Technologist observations: Distal aorta was technically difficult to visualize due to overlying bowel gas. Waveforms appeared stenotic. +------------+--------+--------+----+-----------+--------+--------+----+ Right KidneyPSV cm/sEDV cm/sRI  Left KidneyPSV cm/sEDV cm/sRI   +------------+--------+--------+----+-----------+--------+--------+----+ Upper Pole  29      6       0.79Upper Pole 28      9       0.68 +------------+--------+--------+----+-----------+--------+--------+----+ Mid         21      12      0.43Mid        29      12      0.60  +------------+--------+--------+----+-----------+--------+--------+----+ Lower Pole  21      10      0.54Lower Pole 28      10      0.63 +------------+--------+--------+----+-----------+--------+--------+----+ Hilar       37      7       0.80Hilar      27      9       0.65 +------------+--------+--------+----+-----------+--------+--------+----+ +------------------+-----+------------------+-----+ Right Kidney           Left Kidney             +------------------+-----+------------------+-----+ RAR                    RAR                     +------------------+-----+------------------+-----+ RAR (manual)      1.0  RAR (manual)      2.6   +------------------+-----+------------------+-----+ Cortex                 Cortex                  +------------------+-----+------------------+-----+ Cortex thickness  Corex thickness         +------------------+-----+------------------+-----+ Kidney length (cm)10.55Kidney length (cm)11.05 +------------------+-----+------------------+-----+  Summary: Renal:  Right: 1-59% stenosis of the right renal artery. Normal size right        kidney. Cyst(s) noted. Left:  Evidence 60 percent or less stenosis in the left renal artery        by velocities. Normal size of left kidney.  *See table(s) above for measurements and observations.  Diagnosing physician: Jamelle Haring  Electronically signed by Jamelle Haring on 02/24/2020 at 5:19:57 PM.    Final     Cardiac Studies    Right/Left Cardiac Catheterization 02/13/2020:   Non-stenotic Mid LAD lesion was previously treated.  Prox Cx to Mid Cx lesion is 20% stenosed.  Dist RCA lesion is 20% stenosed.  Non-stenotic Mid RCA lesion was previously treated.  Hemodynamic findings consistent with mild pulmonary hypertension.  LV end diastolic pressure is mildly elevated.  1. Nonobstructive CAD. Continued excellent patency of stents in the LAD and RCA 2. Mildly elevated LV filling  pressures 3. Mildly elevated right heart pressures. 4. Normal cardiac output  Plan: continue medical therapy. Diuresis. BP control.  Diagnostic Dominance: Right     _______________  Echocardiogram 02/17/2020: Impression 1. Compared with the echo 02/5425, systolic function is worse and the  pleural effusion is new.  2. Hypokinesis of the basal to mid anteroseptal, inferoseptal and  inferior myocardium. Left ventricular ejection fraction, by estimation, is  40 to 45%. The left ventricle has mildly decreased function. The left  ventricle demonstrates regional wall  motion abnormalities (see scoring diagram/findings for description). Left  ventricular diastolic parameters are consistent with Grade II diastolic  dysfunction (pseudonormalization). Elevated left ventricular end-diastolic  pressure.  3. Moderate pleural effusion in the left lateral region.  4. Mild to moderate mitral valve regurgitation.  5. Tricuspid valve regurgitation is moderate.  6. The aortic valve is tricuspid. Aortic valve regurgitation is mild.  7. There is moderately elevated pulmonary artery systolic pressure.  8. The inferior vena cava is dilated in size with <50% respiratory  variability, suggesting right atrial pressure of 15 mmHg.   Patient Profile     86 y.o. female here with acute on chronic combined systolic and diastolic heart failure.  Has CAD with prior STEMI status post DES to mid LAD in early January 2021 and then recent non-STEMI in December 2021 status post DES to RCA.  Cardiac catheterization as above shows patent stents.  EF has fluctuated slightly now 40 to 45%.  Assessment & Plan    Acute on chronic combined systolic and diastolic heart failure -Right and left heart catheterization did show excellent patency of stents with mildly elevated left ventricular filling pressures.  Also had mildly elevated right-sided heart pressures.  Cardiac output appeared normal. -EF  approximately 40 to 45%. -Now back on p.o. Lasix low-dose. -No ACE inhibitor or ARB or ARNI secondary to renal function. -On beta-blocker.  New onset atrial fibrillation/flutter -This has been paroxysmal.  Last night she did have an episode of A. fib with RVR with rates in the 120 ranges.  Carvedilol was increased to 12.5 twice daily.  Low-dose Lopressor was also administered.  I will go ahead and increase her carvedilol up to 25 mg twice a day.  Now heart rate is 89 with underlying atrial flutter with variable conduction.  She is on p.o. amiodarone with plan of 400 mg twice a day for a week, 400 mg once a day for 2 weeks  and then 200 mg a day thereafter. -She continues with Eliquis as well dose adjusted to 2.5 given age and renal function.  Demand ischemia from heart failure exacerbation -Troponin was 1700 peak when she was in the midst of her exertional dyspnea/heart failure episode.  Patent stents with no thrombotic disease noted.  Pleural effusion -Improved with thoracentesis on 1/27.  Appreciate critical care medicine.  Hypertension -Recently increased isosorbide to 90 mg.  Amlodipine previously led to edema  Hyperlipidemia -LDL 83 goal less than 70. -Statin intolerance to Livalo.  PCSK9 was to expensive for her. -We will try low-dose Crestor 2.5 mg every other day.  Iron deficiency anemia -GI was consulted.  Felt more secondary to CKD.  Endoscopic evaluation not recommended at this time given heart failure.  Aspirin was stopped. -GI was okay with Eliquis being resumed. -They recommended Protonix 40 mg indefinitely. -Dr. Harrington Challenger did state that there would need to be a review use of Plavix in this setting as well given patent stents on cath this admit.  CKD stage IIIb -Renal had evaluation.  Appreciate their assistance.  They were okay with continued dose of low-dose Lasix 20 mg a day.  Hyponatremia -Sodium was down to 129 on admission, improved.  Monitor closely with diuretics.   Fluid restriction.  For questions or updates, please contact Jet Please consult [email or web address] for contact info under        Signed, Candee Furbish, MD  02/25/2020, 10:03 AM

## 2020-02-25 NOTE — Progress Notes (Addendum)
eLink Physician-Brief Progress Note Patient Name: Crystal [name] DOB: 11-May-1934 MRN: [id number]   Date of Service  02/25/2020  HPI/Events of Note  Afib with RVR with rate 120, patient also having occasional PVC's.  eICU Interventions  Coreg dose increased to 12.5 mg po bid, Lopressor 2.5 mg iv x 1.        Jaran Sainz U Winona Sison 02/25/2020, 1:25 AM

## 2020-02-25 NOTE — Plan of Care (Signed)

## 2020-02-26 DIAGNOSIS — I480 Paroxysmal atrial fibrillation: Secondary | ICD-10-CM | POA: Diagnosis not present

## 2020-02-26 DIAGNOSIS — I214 Non-ST elevation (NSTEMI) myocardial infarction: Secondary | ICD-10-CM | POA: Diagnosis not present

## 2020-02-26 LAB — BASIC METABOLIC PANEL
Anion gap: 10 (ref 5–15)
BUN: 43 mg/dL — ABNORMAL HIGH (ref 8–23)
CO2: 25 mmol/L (ref 22–32)
Calcium: 8 mg/dL — ABNORMAL LOW (ref 8.9–10.3)
Chloride: 93 mmol/L — ABNORMAL LOW (ref 98–111)
Creatinine, Ser: 2.02 mg/dL — ABNORMAL HIGH (ref 0.44–1.00)
GFR, Estimated: 24 mL/min — ABNORMAL LOW (ref >60)
Glucose, Bld: 102 mg/dL — ABNORMAL HIGH (ref 70–99)
Potassium: 2.9 mmol/L — ABNORMAL LOW (ref 3.5–5.1)
Sodium: 128 mmol/L — ABNORMAL LOW (ref 135–145)

## 2020-02-26 LAB — GLUCOSE, CAPILLARY
Glucose-Capillary: 75 mg/dL (ref 70–99)
Glucose-Capillary: 144 mg/dL — ABNORMAL HIGH (ref 70–99)
Glucose-Capillary: 117 mg/dL — ABNORMAL HIGH (ref 70–99)
Glucose-Capillary: 139 mg/dL — ABNORMAL HIGH (ref 70–99)
Glucose-Capillary: 118 mg/dL — ABNORMAL HIGH (ref 70–99)

## 2020-02-26 LAB — MAGNESIUM: Magnesium: 1.7 mg/dL (ref 1.7–2.4)

## 2020-02-26 MED ORDER — ISOSORBIDE MONONITRATE ER 60 MG PO TB24
120.0000 mg | ORAL_TABLET | Freq: Every day | ORAL | Status: DC
  Administered 2020-02-26 – 2020-03-03 (×7): 120 mg via ORAL
  Filled 2020-02-26 (×7): qty 2

## 2020-02-26 MED ORDER — POTASSIUM CHLORIDE CRYS ER 20 MEQ PO TBCR
40.0000 meq | EXTENDED_RELEASE_TABLET | Freq: Every day | ORAL | Status: DC
  Administered 2020-02-26 – 2020-03-04 (×8): 40 meq via ORAL
  Filled 2020-02-26 (×8): qty 2

## 2020-02-26 MED ORDER — INSULIN ASPART 100 UNIT/ML ~~LOC~~ SOLN
0.0000 [IU] | Freq: Three times a day (TID) | SUBCUTANEOUS | Status: DC
  Administered 2020-02-28 – 2020-03-03 (×7): 1 [IU] via SUBCUTANEOUS

## 2020-02-26 MED ORDER — MAGNESIUM SULFATE 2 GM/50ML IV SOLN
2.0000 g | Freq: Once | INTRAVENOUS | Status: AC
  Administered 2020-02-26: 2 g via INTRAVENOUS
  Filled 2020-02-26: qty 50

## 2020-02-26 MED ORDER — POTASSIUM CHLORIDE 20 MEQ PO PACK
20.0000 meq | PACK | Freq: Once | ORAL | Status: AC
  Administered 2020-02-26: 20 meq via ORAL
  Filled 2020-02-26: qty 1

## 2020-02-26 NOTE — Progress Notes (Deleted)
Cardiology Office Note  Date: 02/26/2020   ID: Crystal [name], DOB Oct 04, 1934, MRN [id number]  PCP:  Glenda Chroman, MD  Cardiologist:  Rozann Lesches, MDRD, breast CA, DM2.   Electrophysiologist:  None   Chief Complaint: Follow-up NSTEMI  History of Present Illness: Crystal [name] is a 86 y.o. female with a history of CAD, ischemic cardiomyopathy, congestive heart failure, HLD, HTN, GELast saw Dr. Domenic Polite 12/19/2019.  Reported stable fatigue with ADLs.  Denied any shortness of breath or chest pain.  Echocardiogram in May reveal improvement in LVEF to the range of 45 to 50%.  She did not tolerate Crestor.  History of prior intolerance to Lipitor and Zetia.  Was not interested in any other medications for treatment of hyperlipidemia.  Systolic blood pressures in the 140s to 150s.  Atacand increased to 60 mg a.m. and 80 mg in the evening.  Basic metabolic panel was ordered.  Status post STEMI January 2021 with DES to mid LAD.  Continuing aspirin and Plavix.  Last creatinine was 1.34.  She was continuing Atacand, Coreg, Lasix, Aldactone for ischemic cardiomyopathy.  Recent admission for NSTEMI January 16, 2020.  Prior STEMI in January 2021 with DES to mid LAD.  History of chronic systolic heart failure with prior EF 30 to 35% with most recent EF of 45 to 50%.  The day prior to admission she began having left chest and shoulder pain radiating down left arm.  Troponins were initially 843--741.  EKG with new inferior T wave inversion.  She was transferred from [facility] to [facility] for further management.  She had totally occluded RCA with stent placement.  Had no reflow treated with IC verapamil resulting in improved blood flow.  Previously placed LAD stent widely patent.  She was taken back to the Cath Lab because of her current chest pain and EKG changes revealing a widely patent stent.  Has an allergy to Brilinta.  She had no recurrent chest pain.  She had worked well with cardiac rehab in  the past.  Due to statin intolerance PCSK9 her meds discussed in the past but patient stated the cost was too high.  She was continuing on spironolactone and ARB as well as carvedilol at home.  She developed acute kidney injury and spironolactone/ARB were held.  Hydralazine 25 mg p.o. twice daily was added at time of discharge.  Creatinine increased to 1.9 post cath.  She was treated with IV fluids with improvement of creatinine to 1.86 the following day.  Plan was to hold spironolactone and ARB at discharge.  Plan to recheck creatinine the following week  Patient is here for transitional care follow-up status post recent NSTEMI December 2021.  Previous MI January 21 with DES to mid LAD.  Recent MI demonstrated totally occluded RCA.  DES stent placed.  She had subsequent chest pain and was taken back to the Cath Lab.  Stent remained widely patent.  States since being discharged she feels tired.  Her spironolactone and ARB were stopped due to worsening renal function.  Recent repeat lab work 3 days ago showed creatinine of 1.88 and GFR 26.  This is slightly worse than 7 days ago when creatinine was 1.86 and GFR was 27.  She denies any anginal or exertional symptoms.  Denies any palpitations, orthostatic symptoms, CVA or TIA-like symptoms, bleeding.  Denies any PND, orthopnea.  Denies any claudication-like symptoms, DVT or PE-like symptoms.  Right radial artery access site clean and dry without any  redness or swelling.  2+ pulses.  Right groin access site clean and dry without bruising.  Good pulses.  Denies any significant lower extremity edema.  Recent presentation 1/16/202 with few weeks of DOE and L sided jaw pain. Trop 254 > 1735 w/ NSTEMI. BNP > 4000, volume overload by exam. Given Lasix 20 mg x 2 with minimal improvement in breathing. BP was elevated on arrival but improved later. Statin intolerant. Recently started on pitavastatin. Could not afford PCSK9i . Cardiac catheterization was planned.  Past  Medical History:  Diagnosis Date   Bell's palsy    Breast cancer ([place]) 1998   Right mastectomy   CAD (coronary artery disease)    a. s/p STEMI in 01/2019 with DES to mid-LAD   CHF (congestive heart failure) ([place])    a. EF 30-35% by echo in 01/2019   Essential hypertension    GERD (gastroesophageal reflux disease)    Gout    History of skin cancer    Squamous cell, left shoulder   Mixed hyperlipidemia    Osteopenia    Thyroid nodule    Type 2 diabetes mellitus ([place])     Past Surgical History:  Procedure Laterality Date   BIOPSY  10/13/2019   Procedure: BIOPSY;  Surgeon: Daneil Dolin, MD;  [facility];  Service: Endoscopy;;   CATARACT EXTRACTION  2016   CORONARY ANGIOGRAPHY N/A 01/16/2020   Procedure: CORONARY ANGIOGRAPHY;  Surgeon: Jettie Booze, MD;  Location: [place] [facility];  Service: Cardiovascular;  Laterality: N/A;   CORONARY STENT INTERVENTION N/A 01/16/2020   Procedure: CORONARY STENT INTERVENTION;  Surgeon: Jettie Booze, MD;  Location: [place] [facility];  Service: Cardiovascular;  Laterality: N/A;   CORONARY/GRAFT ACUTE MI REVASCULARIZATION N/A 01/30/2019   Procedure: Coronary/Graft Acute MI Revascularization;  Surgeon: Burnell Blanks, MD;  Location: [place] [facility];  Service: Cardiovascular;  Laterality: N/A;   ESOPHAGOGASTRODUODENOSCOPY (EGD) WITH PROPOFOL N/A 10/13/2019   Non-obstructing Schatzki ring at GE junction, s/p dilation, erosive gastropathy with stigmata of recent bleeding, normal duodenum. Negative H.pylori.    HYSTEROSCOPY     INTRAVASCULAR ULTRASOUND/IVUS N/A 01/16/2020   Procedure: Intravascular Ultrasound/IVUS;  Surgeon: Jettie Booze, MD;  Location: [place] [facility];  Service: Cardiovascular;  Laterality: N/A;   LEFT HEART CATH AND CORONARY ANGIOGRAPHY N/A 01/30/2019   Procedure: LEFT HEART CATH AND CORONARY ANGIOGRAPHY;  Surgeon: Burnell Blanks, MD;  Location: [place] [facility]  LAB;  Service: Cardiovascular;  Laterality: N/A;   LEFT HEART CATH AND CORONARY ANGIOGRAPHY N/A 01/16/2020   Procedure: LEFT HEART CATH AND CORONARY ANGIOGRAPHY;  Surgeon: Jettie Booze, MD;  Location: [place] [facility];  Service: Cardiovascular;  Laterality: N/A;   MALONEY DILATION N/A 10/13/2019   Procedure: Venia Minks DILATION;  Surgeon: Daneil Dolin, MD;  [facility];  Service: Endoscopy;  Laterality: N/A;   Right mastectomy  1998   [place]   RIGHT/LEFT HEART CATH AND CORONARY ANGIOGRAPHY N/A 02/13/2020   Procedure: RIGHT/LEFT HEART CATH AND CORONARY ANGIOGRAPHY;  Surgeon: Martinique, Peter M, MD;  Location: [place] [facility];  Service: Cardiovascular;  Laterality: N/A;    No current facility-administered medications for this visit.   No current outpatient medications on file.   Facility-Administered Medications Ordered in Other Visits  Medication Dose Route Frequency Provider Last Rate Last Admin   0.9 %  sodium chloride infusion  250 mL Intravenous PRN Jerline Pain, MD   Stopped at 02/26/20 [phone]  acetaminophen (TYLENOL) tablet 650 mg  650 mg Oral Q4H PRN Jerline Pain, MD   650 mg at 02/14/20 2142   alum & mag hydroxide-simeth (MAALOX/MYLANTA) 200-200-20 MG/5ML suspension 30 mL  30 mL Oral Q2H PRN Jerline Pain, MD   30 mL at 02/16/20 1952   amiodarone (PACERONE) tablet 400 mg  400 mg Oral BID Jerline Pain, MD   400 mg at 02/26/20 6659   apixaban (ELIQUIS) tablet 2.5 mg  2.5 mg Oral BID Jerline Pain, MD   2.5 mg at 02/26/20 9357   bisacodyl (DULCOLAX) EC tablet 5 mg  5 mg Oral Daily PRN Jerline Pain, MD       calcium-vitamin D (OSCAL WITH D) 500-200 MG-UNIT per tablet 1 tablet  1 tablet Oral QAC lunch Jerline Pain, MD   1 tablet at 02/26/20 1217   carvedilol (COREG) tablet 25 mg  25 mg Oral BID WC Jerline Pain, MD   25 mg at 02/26/20 1820   Chlorhexidine Gluconate Cloth 2 % PADS 6 each  6 each Topical Daily Jerline Pain, MD   6 each at  02/26/20 0177   cholecalciferol (VITAMIN D3) tablet 1,000 Units  1,000 Units Oral Q breakfast Jerline Pain, MD   1,000 Units at 02/26/20 0744   clopidogrel (PLAVIX) tablet 75 mg  75 mg Oral q morning - 10a Jerline Pain, MD   75 mg at 02/26/20 9390   diazepam (VALIUM) tablet 2 mg  2 mg Oral Daily PRN Jerline Pain, MD   2 mg at 02/20/20 0034   doxepin (SINEQUAN) 10 MG/ML solution 3 mg  3 mg Oral QHS PRN Jerline Pain, MD   3 mg at 02/20/20 0034   ferrous sulfate tablet 325 mg  325 mg Oral QAC lunch Jerline Pain, MD   325 mg at 02/26/20 1217   furosemide (LASIX) tablet 40 mg  40 mg Oral Daily Jerline Pain, MD   40 mg at 02/26/20 3009   hydrALAZINE (APRESOLINE) injection 5-10 mg  5-10 mg Intravenous Q4H PRN Jerline Pain, MD   5 mg at 02/26/20 0502   hydrALAZINE (APRESOLINE) tablet 100 mg  100 mg Oral Q8H Jerline Pain, MD   100 mg at 02/26/20 1351   [START ON 02/27/2020] insulin aspart (novoLOG) injection 0-6 Units  0-6 Units Subcutaneous TID WC Dunn, Dayna N, PA-C       isosorbide mononitrate (IMDUR) 24 hr tablet 120 mg  120 mg Oral QHS Justin Mend, MD       magnesium hydroxide (MILK OF MAGNESIA) suspension 30 mL  30 mL Oral Daily PRN Jerline Pain, MD       nitroGLYCERIN (NITROSTAT) SL tablet 0.4 mg  0.4 mg Sublingual Q5 Min x 3 PRN Jerline Pain, MD       ondansetron (ZOFRAN) injection 4 mg  4 mg Intravenous Q6H PRN Jerline Pain, MD   4 mg at 02/17/20 1428   pantoprazole (PROTONIX) EC tablet 40 mg  40 mg Oral Daily Jerline Pain, MD   40 mg at 02/26/20 0942   polycarbophil (FIBERCON) tablet 625 mg  625 mg Oral Daily Jerline Pain, MD   625 mg at 02/26/20 0942   potassium chloride SA (KLOR-CON) CR tablet 40 mEq  40 mEq Oral Daily Jerline Pain, MD   40 mEq at 02/26/20 0942   rosuvastatin (CRESTOR) tablet 2.5 mg  2.5 mg Oral  Q48H Jerline Pain, MD   2.5 mg at 02/26/20 2637   saccharomyces boulardii (FLORASTOR) capsule 250 mg  250 mg Oral BID Jerline Pain,  MD   250 mg at 02/26/20 8588   sodium chloride flush (NS) 0.9 % injection 3 mL  3 mL Intravenous Q12H Jerline Pain, MD   3 mL at 02/26/20 5027   Allergies:  Bactrim [sulfamethoxazole-trimethoprim], Sulfa antibiotics, Amlodipine, Clonidine derivatives, Evista [raloxifene hcl], Fosamax [alendronate sodium], Glipizide, Lipitor [atorvastatin calcium], Losartan, Pravastatin, and Azithromycin   Social History: The patient  reports that she has never smoked. She has never used smokeless tobacco. She reports that she does not drink alcohol and does not use drugs.   Family History: The patient's family history includes Aneurysm in her sister; Cancer in her sister; Heart attack in her father, maternal uncle, maternal uncle, maternal uncle, paternal aunt, paternal aunt, paternal grandfather, and sister; Heart disease in her maternal uncle, maternal uncle, maternal uncle, paternal aunt, paternal aunt, paternal grandfather, and sister; Stroke in her mother.   ROS:  Please see the history of present illness. Otherwise, complete review of systems is positive for none.  All other systems are reviewed and negative.   Physical Exam: VS:  There were no vitals taken for this visit., BMI There is no height or weight on file to calculate BMI.  Wt Readings from Last 3 Encounters:  02/26/20 141 lb 5 oz (64.1 kg)  01/26/20 138 lb 3.2 oz (62.7 kg)  01/18/20 139 lb 15.9 oz (63.5 kg)    General: Patient appears comfortable at rest. Neck: Supple, no elevated JVP or carotid bruits, no thyromegaly. Lungs: Clear to auscultation, nonlabored breathing at rest. Cardiac: Regular rate and rhythm, no S3 or significant systolic murmur, no pericardial rub. Extremities: No pitting edema, distal pulses 2+. Skin: Warm and dry.  Right radial access site and right groin access site clean and dry without redness swelling or edema noted.  2+ pulses Musculoskeletal: No kyphosis. Neuropsychiatric: Alert and oriented x3, affect grossly  appropriate.  ECG:  An ECG dated 01/26/2020 was personally reviewed today and demonstrated:  Normal sinus rhythm rate of 64.  Recent Labwork: 01/16/2020: ALT 14; AST 24 02/14/2020: TSH 2.238 02/23/2020: B Natriuretic Peptide 3,507.5 02/25/2020: Hemoglobin 9.8; Platelets 281 02/26/2020: BUN 43; Creatinine, Ser 2.02; Magnesium 1.7; Potassium 2.9; Sodium 128     Component Value Date/Time   CHOL 155 02/14/2020 0339   TRIG 74 02/14/2020 0339   HDL 57 02/14/2020 0339   CHOLHDL 2.7 02/14/2020 0339   VLDL 15 02/14/2020 0339   LDLCALC 83 02/14/2020 0339    Other Studies Reviewed Today:   Renal US 02/24/2020 Renal: Right: 1-59% stenosis of the right renal artery. Normal size right kidney. Cyst(s) noted. Left: Evidence 60 percent or less stenosis in the left renal artery by velocities. Normal size of left kidney.   Echocardiogram 02/17/2020 1. Compared with the echo 74/1287, systolic function is worse and the pleural effusion is new. 2. Hypokinesis of the basal to mid anteroseptal, inferoseptal and inferior myocardium. Left ventricular ejection fraction, by estimation, is 40 to 45%. The left ventricle has mildly decreased function. The left ventricle demonstrates regional wall motion abnormalities (see scoring diagram/findings for description). Left ventricular diastolic parameters are consistent with Grade II diastolic dysfunction (pseudonormalization). Elevated left ventricular enddiastolic pressure. 3. Moderate pleural effusion in the left lateral region. 4. Mild to moderate mitral valve regurgitation. 5. Tricuspid valve regurgitation is moderate. 6. The aortic valve is tricuspid. Aortic  valve regurgitation is mild. 7. There is moderately elevated pulmonary artery systolic pressure. 8. The inferior vena cava is dilated in size with <50% respiratory variability, suggesting right atrial pressure of 15 mmHg.      02/13/2020 RIGHT/LEFT HEART CATH AND CORONARY ANGIOGRAPHY     Conclusion    Non-stenotic Mid LAD lesion was previously treated.  Prox Cx to Mid Cx lesion is 20% stenosed.  Dist RCA lesion is 20% stenosed.  Non-stenotic Mid RCA lesion was previously treated.  Hemodynamic findings consistent with mild pulmonary hypertension.  LV end diastolic pressure is mildly elevated.   1. Nonobstructive CAD. Continued excellent patency of stents in the LAD and RCA 2. Mildly elevated LV filling pressures 3. Mildly elevated right heart pressures. 4. Normal cardiac output  Plan: continue medical therapy. Diuresis. BP control.  Diagnostic Dominance: Right                  Cath: 01/16/20   Previously placed Mid LAD drug eluting stent is widely patent.  Prox Cx to Mid Cx lesion is 20% stenosed.  Mid RCA lesion is 99% stenosed.  A drug-eluting stent was successfully placed using a STENT RESOLUTE ONYX 3.5X38, postdilated to > 4 mm and optimized with IVUS. Transient no reflow when the stent was deployed, which resolved with IC verapamil.  Post intervention, there is a 0% residual stenosis.  Dist RCA lesion is 20% stenosed.  LV end diastolic pressure is low.  There is no aortic valve stenosis.  Continue aggressive secondary prevention. DAPT for 12 months.   Watch overnight.  Diagnostic Dominance: Right    Intervention     Echo: 01/17/20  IMPRESSIONS    1. Left ventricular ejection fraction, by estimation, is 50 to 55%. The  left ventricle has low normal function. The left ventricle has no regional  wall motion abnormalities. There is mild left ventricular hypertrophy.  Left ventricular diastolic  parameters are consistent with Grade I diastolic dysfunction (impaired  relaxation). Elevated left atrial pressure.  2. Right ventricular systolic function is normal. The right ventricular  size is normal.  3. Left atrial size was mildly dilated.  4. The mitral valve is normal in structure. Mild  mitral valve  regurgitation. No evidence of mitral stenosis.  5. The aortic valve has an indeterminant number of cusps. Aortic valve  regurgitation is mild to moderate. No aortic stenosis is present.  6. The inferior vena cava is normal in size with greater than 50%  respiratory variability, suggesting right atrial pressure of 3 mmHg.     Cardiac catheterization 01/30/2019:  Prox RCA lesion is 60% stenosed.  Mid RCA lesion is 40% stenosed.  Prox Cx to Mid Cx lesion is 20% stenosed.  Mid LAD lesion is 99% stenosed.  A drug-eluting stent was successfully placed using a SYNERGY XD 3.0X28.  Post intervention, there is a 0% residual stenosis.  1. Severe stenosis mid LAD 2. Successful PTCA/DES x 1 mid LAD 3. Mild non-obstructive disease in the Circumflex 4. Moderate non-obstructive disease in the mid RCA  Recommendations: Will admit to the ICU. DAPT with ASA and Brilinta for one year. Continue home beta blocker. Echo in am. She is statin intolerant. Fast track discharge, possibly home tomorrow if stable.  Echocardiogram 06/13/2019: 1. Left ventricular ejection fraction, by estimation, is 45 to 50%. The  left ventricle has mildly decreased function. The left ventricle  demonstrates regional wall motion abnormalities (see scoring  diagram/findings for description). Left ventricular  diastolic parameters are consistent  with Grade I diastolic dysfunction  (impaired relaxation). Elevated left ventricular end-diastolic pressure.  2. Right ventricular systolic function is mildly reduced. The right  ventricular size is normal. There is normal pulmonary artery systolic  pressure.  3. Left atrial size was mildly dilated.  4. The mitral valve is degenerative. Mild mitral valve regurgitation.  5. Tricuspid valve regurgitation is mild to moderate.  6. The aortic valve was not well visualized. Aortic valve regurgitation  is mild. No aortic stenosis is present.  7. The inferior vena  cava is normal in size with greater than 50%  respiratory variability, suggesting right atrial pressure of 3 mmHg.   Comparison(s): Echocardiogram done 01/31/19 showed an EF of 30-35%.     Assessment and Plan:   1. Non-ST elevation (NSTEMI) myocardial infarction [facility]) Recent NSTEMI with stent placement to the totally occluded RCA.  Currently denies any anginal or exertional symptoms.  Continue aspirin 81 mg daily.  Continue carvedilol 25 mg p.o. twice daily.  Continue Plavix 75 mg daily.  Continue Imdur 30 mg daily.  Continue nitroglycerin sublingual as needed.  2. Ischemic cardiomyopathy Recent repeat echo on 01/17/2020: EF 50 to 55%.  No WMA's, mild LVH, G1 DD.  Elevated left atrial pressure.  Mild mitral regurgitation.  Mild to moderate AR.  3. CAD in native artery Previous STEMI in January with LAD stent.  Status post recent NSTEMI with total occlusion of RCA with DES.  Continue aspirin 81 mg daily.  Continue carvedilol 25 mg p.o. twice daily, continue Plavix 75 mg p.o. daily.  Continue Imdur 30 mg daily.  Continue sublingual nitroglycerin as needed.  4. Essential hypertension Blood pressure elevated on arrival today at 164/52.  Her spironolactone and losartan had been withheld due to decreasing renal function.  Increase hydralazine to 50 mg p.o. twice daily.  Continue carvedilol 25 mg p.o. twice daily.   5. Stage 3b chronic kidney disease ([place]) Recent increase in renal dysfunction.  Please refer to Dr. Theador Hawthorne nephrology for evaluation regarding stage III CKD.   6.  Hyperlipidemia Intolerant to multiple statins.  Previously insurance company would not pay for PCSK9 inhibitor.  Given her recent significant progression of RCA disease from 60% stenosis to the total occlusion over less than 12 months she needs to start either Repatha or another injectable.  Please refer to lipid clinic for reevaluation.    Medication Adjustments/Labs and Tests Ordered: Current medicines are reviewed  at length with the patient today.  Concerns regarding medicines are outlined above.   Disposition: Follow-up with Dr. Domenic Polite or APP 1 month  Signed, Levell July, NP 02/26/2020 9:15 PM    [facility] [place], [place], [place] [address] Phone: [phone]; Fax: [phone]

## 2020-02-26 NOTE — Progress Notes (Signed)
[facility] NEPHROLOGY PROGRESS NOTE  Assessment/ Plan: Pt is a 86 y.o. yo female  with history of hypertension, HLD, CKD 3 with baseline creatinine level around 1.3-1.9, chronic combined CHF, CAD who was admitted on 1/17 for acute CHF exacerbation and an NSTEMI, seen as a consultation for acute kidney injury on CKD.  #Acute kidney injury on CKD stage IIIb/IV: Likely due to contrast nephropathy in association with hemodynamic changes related with A. fib with RVR, CHF/cardiorenal syndrome. UA with minimal protein but has bacteria, no RBC. Reportedly bladder scan with no urinary retention.  The kidney ultrasound ruled out obstruction.   Creatinine is now improved from peak of 3.5 on 1/22 to 2 today which is her baseline.  Avoid nephrotoxins or hypotensive episode.  No need for dialysis.   Outpt nephrology follow up in clinic.  03/23/20 with Dr. Johnney Ou at 8:45, arrive 8:15am  #Acute CHF exacerbation: She's quite sensitive and has required diuretics, fluids, diuretics.  1/27 HTN driven flash pulmonary edema - lasix 40 IV with fair diuresis; repeated dose 1/28 given persistent rales.  She was switched to lasix 40 po daily yesterday, prior home dose 20 daily.  I'd keep the 40mg  daily for now, I think.   Daily weights, 2g sodium, fluid restriction.  #Hyponatremia, hypervolemic: has been high 120s - low 130s, 128 today.  Cont to trend.   #hypokalemia:  Being repleted  # NSTEMI: Status post cardiac cath with stent placement in RCA and LAD.  On Plavix.  No Eliquis or aspirin due to GI bleed initially but has been resumed now with new a fib.  #Anemia with stool occult blood positive:  Anticoag was held but resumed at lower dose 1/23.   Treated with IV iron.  Monitor hemoglobin, currently stable in 9s.  #A. Fib/AFl with RVR: On amiodarone and cored with cardiology managing.  Low dose eliquis.   #Hypertension: Improved with med titration but still HTN.  ^ imdur to 120. Renal artery  duplex neg for high grade RAS.  Will sign off given back to baseline renal function, volume status improved and hyponatremia mild and stable - please page with any concerns/questions  Subjective:  In SR now.  Feeling ok this AM.  Breathing improved.   UOP 872mL yesterday with lasix 40 po, no Ins documented.  Per above f/u in nephrology for CKD care later this month.  Objective Vital signs in last 24 hours: Vitals:   02/26/20 0600 02/26/20 0630 02/26/20 0736 02/26/20 0800  BP: (!) 166/45 (!) 168/42  (!) 147/42  Pulse: 64 63  63  Resp: 16 17  (!) 24  Temp:   98.8 F (37.1 C)   TempSrc:      SpO2: 94% 91%  93%  Weight:      Height:       Weight change: 3.2 kg  Intake/Output Summary (Last 24 hours) at 02/26/2020 0937 Last data filed at 02/26/2020 0300 Gross per 24 hour  Intake 3 ml  Output 840 ml  Net -837 ml       Labs: Basic Metabolic Panel: Recent Labs  Lab 02/24/20 0036 02/25/20 0046 02/26/20 0558  NA 133* 128* 128*  K 3.7 3.9 2.9*  CL 96* 93* 93*  CO2 26 24 25   GLUCOSE 104* 95 102*  BUN 43* 45* 43*  CREATININE 2.11* 1.95* 2.02*  CALCIUM 8.6* 8.2* 8.0*   Liver Function Tests: No results for input(s): AST, ALT, ALKPHOS, BILITOT, PROT, ALBUMIN in the last 168 hours. No  results for input(s): LIPASE, AMYLASE in the last 168 hours. No results for input(s): AMMONIA in the last 168 hours. CBC: Recent Labs  Lab 02/21/20 0252 02/22/20 0207 02/23/20 0350 02/24/20 0036 02/25/20 0046  WBC 7.7 7.2 6.5 6.9 6.8  HGB 9.0* 9.3* 9.4* 9.7* 9.8*  HCT 27.4* 27.3* 28.8* 30.3* 29.8*  MCV 83.8 83.0 84.0 84.9 83.2  PLT 261 270 275 292 281   Cardiac Enzymes: No results for input(s): CKTOTAL, CKMB, CKMBINDEX, TROPONINI in the last 168 hours. CBG: Recent Labs  Lab 02/25/20 1514 02/25/20 1930 02/25/20 2309 02/26/20 0346 02/26/20 0735  GLUCAP 170* 137* 173* 75 144*    Iron Studies: No results for input(s): IRON, TIBC, TRANSFERRIN, FERRITIN in the last 72  hours. Studies/Results: VAS US RENAL ARTERY DUPLEX  Result Date: 02/24/2020 ABDOMINAL VISCERAL Indications: Hypertension emergency High Risk Factors: Hypertension, hyperlipidemia, Diabetes, coronary artery                    disease. Limitations: Air/bowel gas. Performing Technologist: Oda Cogan RDMS, RVT  Examination Guidelines: A complete evaluation includes B-mode imaging, spectral Doppler, color Doppler, and power Doppler as needed of all accessible portions of each vessel. Bilateral testing is considered an integral part of a complete examination. Limited examinations for reoccurring indications may be performed as noted.  Duplex Findings: +--------------------+--------+--------+------+--------+ Mesenteric          PSV cm/sEDV cm/sPlaqueComments +--------------------+--------+--------+------+--------+ Aorta Prox            108                 Stenotic +--------------------+--------+--------+------+--------+ Aorta Distal          262                 stenotic +--------------------+--------+--------+------+--------+ Celiac Artery Origin  239                          +--------------------+--------+--------+------+--------+ SMA Proximal          221                          +--------------------+--------+--------+------+--------+   +------------------+--------+--------+-----------------+ Right Renal ArteryPSV cm/sEDV cm/s     Comment      +------------------+--------+--------+-----------------+ Origin              137      19   poorly visualized +------------------+--------+--------+-----------------+ Proximal            109      22                     +------------------+--------+--------+-----------------+ Mid                 124      17                     +------------------+--------+--------+-----------------+ Distal              103      17                     +------------------+--------+--------+-----------------+  +-----------------+--------+--------+-------+ Left Renal ArteryPSV cm/sEDV cm/sComment +-----------------+--------+--------+-------+ Origin             281      28           +-----------------+--------+--------+-------+ Proximal           157  17           +-----------------+--------+--------+-------+ Mid                 71      22           +-----------------+--------+--------+-------+ Distal              89      15           +-----------------+--------+--------+-------+  Technologist observations: Distal aorta was technically difficult to visualize due to overlying bowel gas. Waveforms appeared stenotic. +------------+--------+--------+----+-----------+--------+--------+----+ Right KidneyPSV cm/sEDV cm/sRI  Left KidneyPSV cm/sEDV cm/sRI   +------------+--------+--------+----+-----------+--------+--------+----+ Upper Pole  29      6       0.79Upper Pole 28      9       0.68 +------------+--------+--------+----+-----------+--------+--------+----+ Mid         21      12      0.43Mid        29      12      0.60 +------------+--------+--------+----+-----------+--------+--------+----+ Lower Pole  21      10      0.54Lower Pole 28      10      0.63 +------------+--------+--------+----+-----------+--------+--------+----+ Hilar       37      7       0.80Hilar      27      9       0.65 +------------+--------+--------+----+-----------+--------+--------+----+ +------------------+-----+------------------+-----+ Right Kidney           Left Kidney             +------------------+-----+------------------+-----+ RAR                    RAR                     +------------------+-----+------------------+-----+ RAR (manual)      1.0  RAR (manual)      2.6   +------------------+-----+------------------+-----+ Cortex                 Cortex                  +------------------+-----+------------------+-----+ Cortex thickness       Corex  thickness         +------------------+-----+------------------+-----+ Kidney length (cm)10.55Kidney length (cm)11.05 +------------------+-----+------------------+-----+  Summary: Renal:  Right: 1-59% stenosis of the right renal artery. Normal size right        kidney. Cyst(s) noted. Left:  Evidence 60 percent or less stenosis in the left renal artery        by velocities. Normal size of left kidney.  *See table(s) above for measurements and observations.  Diagnosing physician: Jamelle Haring  Electronically signed by Jamelle Haring on 02/24/2020 at 5:19:57 PM.    Final     Medications: Infusions: . sodium chloride 250 mL (02/26/20 0700)    Scheduled Medications: . amiodarone  400 mg Oral BID  . apixaban  2.5 mg Oral BID  . calcium-vitamin D  1 tablet Oral QAC lunch  . carvedilol  25 mg Oral BID WC  . Chlorhexidine Gluconate Cloth  6 each Topical Daily  . cholecalciferol  1,000 Units Oral Q breakfast  . clopidogrel  75 mg Oral q morning - 10a  . ferrous sulfate  325 mg Oral QAC lunch  . furosemide  40 mg Oral Daily  . hydrALAZINE  100 mg Oral Q8H  .  insulin aspart  0-6 Units Subcutaneous Q4H  . isosorbide mononitrate  90 mg Oral QHS  . pantoprazole  40 mg Oral Daily  . polycarbophil  625 mg Oral Daily  . potassium chloride SA  40 mEq Oral Daily  . rosuvastatin  2.5 mg Oral Q48H  . saccharomyces boulardii  250 mg Oral BID  . sodium chloride flush  3 mL Intravenous Q12H    have reviewed scheduled and prn medications.  Physical Exam: General:sitting in chair alert Heart: SB on monitor in high 50s Lungs: clear ant, normal WOB at rest, rales in both bases improving Abdomen:soft, Non-tender, non-distended Extremities: trace ankle edema Neurology: Osvaldo Human A Tearra Ouk 02/26/2020,9:37 AM  LOS: 13 days

## 2020-02-26 NOTE — Progress Notes (Signed)
Progress Note  Patient Name: Crystal [name] Date of Encounter: 02/26/2020  [facility] Cardiologist: Rozann Lesches, MD   Subjective   Sitting up in chair, feeling comfortable.  Feels better.  Less shortness of breath  Inpatient Medications    Scheduled Meds: . amiodarone  400 mg Oral BID  . apixaban  2.5 mg Oral BID  . calcium-vitamin D  1 tablet Oral QAC lunch  . carvedilol  25 mg Oral BID WC  . Chlorhexidine Gluconate Cloth  6 each Topical Daily  . cholecalciferol  1,000 Units Oral Q breakfast  . clopidogrel  75 mg Oral q morning - 10a  . ferrous sulfate  325 mg Oral QAC lunch  . furosemide  40 mg Oral Daily  . hydrALAZINE  100 mg Oral Q8H  . insulin aspart  0-6 Units Subcutaneous Q4H  . isosorbide mononitrate  90 mg Oral QHS  . pantoprazole  40 mg Oral Daily  . polycarbophil  625 mg Oral Daily  . potassium chloride SA  40 mEq Oral Daily  . rosuvastatin  2.5 mg Oral Q48H  . saccharomyces boulardii  250 mg Oral BID  . sodium chloride flush  3 mL Intravenous Q12H   Continuous Infusions: . sodium chloride 250 mL (02/26/20 0700)   PRN Meds: sodium chloride, acetaminophen, alum & mag hydroxide-simeth, bisacodyl, diazepam, doxepin, hydrALAZINE, magnesium hydroxide, nitroGLYCERIN, ondansetron (ZOFRAN) IV   Vital Signs    Vitals:   02/26/20 0600 02/26/20 0630 02/26/20 0736 02/26/20 0800  BP: (!) 166/45 (!) 168/42  (!) 147/42  Pulse: 64 63  63  Resp: 16 17  (!) 24  Temp:   98.8 F (37.1 C)   TempSrc:      SpO2: 94% 91%  93%  Weight:      Height:        Intake/Output Summary (Last 24 hours) at 02/26/2020 0919 Last data filed at 02/26/2020 0300 Gross per 24 hour  Intake 3 ml  Output 840 ml  Net -837 ml   Last 3 Weights 02/26/2020 02/25/2020 02/24/2020  Weight (lbs) 141 lb 5 oz 134 lb 4.2 oz 144 lb 10 oz  Weight (kg) 64.1 kg 60.9 kg 65.6 kg      Telemetry    NSR 60's - Personally Reviewed  ECG    No new - Personally Reviewed  Physical Exam    GEN: No acute distress.   Neck: No JVD Cardiac: RRR, no murmurs, rubs, or gallops.  Respiratory: Clear to auscultation bilaterally. GI: Soft, nontender, non-distended  MS: No edema; No deformity. Neuro:  Nonfocal  Psych: Normal affect   Labs    High Sensitivity Troponin:   Recent Labs  Lab 02/12/20 1726 02/12/20 2300 02/13/20 1126 02/13/20 1330  TROPONINIHS 254* 1,735* 1,229* 1,055*      Chemistry Recent Labs  Lab 02/24/20 0036 02/25/20 0046 02/26/20 0558  NA 133* 128* 128*  K 3.7 3.9 2.9*  CL 96* 93* 93*  CO2 26 24 25   GLUCOSE 104* 95 102*  BUN 43* 45* 43*  CREATININE 2.11* 1.95* 2.02*  CALCIUM 8.6* 8.2* 8.0*  GFRNONAA 23* 25* 24*  ANIONGAP 11 11 10      Hematology Recent Labs  Lab 02/23/20 0350 02/24/20 0036 02/25/20 0046  WBC 6.5 6.9 6.8  RBC 3.43* 3.57* 3.58*  HGB 9.4* 9.7* 9.8*  HCT 28.8* 30.3* 29.8*  MCV 84.0 84.9 83.2  MCH 27.4 27.2 27.4  MCHC 32.6 32.0 32.9  RDW 16.4* 16.4* 16.3*  PLT 275  292 281    BNP Recent Labs  Lab 02/23/20 0350  BNP 3,507.5*     DDimer No results for input(s): DDIMER in the last 168 hours.   Radiology    VAS US RENAL ARTERY DUPLEX  Result Date: 02/24/2020 ABDOMINAL VISCERAL Indications: Hypertension emergency High Risk Factors: Hypertension, hyperlipidemia, Diabetes, coronary artery                    disease. Limitations: Air/bowel gas. Performing Technologist: Oda Cogan RDMS, RVT  Examination Guidelines: A complete evaluation includes B-mode imaging, spectral Doppler, color Doppler, and power Doppler as needed of all accessible portions of each vessel. Bilateral testing is considered an integral part of a complete examination. Limited examinations for reoccurring indications may be performed as noted.  Duplex Findings: +--------------------+--------+--------+------+--------+ Mesenteric          PSV cm/sEDV cm/sPlaqueComments +--------------------+--------+--------+------+--------+ Aorta Prox             108                 Stenotic +--------------------+--------+--------+------+--------+ Aorta Distal          262                 stenotic +--------------------+--------+--------+------+--------+ Celiac Artery Origin  239                          +--------------------+--------+--------+------+--------+ SMA Proximal          221                          +--------------------+--------+--------+------+--------+   +------------------+--------+--------+-----------------+ Right Renal ArteryPSV cm/sEDV cm/s     Comment      +------------------+--------+--------+-----------------+ Origin              137      19   poorly visualized +------------------+--------+--------+-----------------+ Proximal            109      22                     +------------------+--------+--------+-----------------+ Mid                 124      17                     +------------------+--------+--------+-----------------+ Distal              103      17                     +------------------+--------+--------+-----------------+ +-----------------+--------+--------+-------+ Left Renal ArteryPSV cm/sEDV cm/sComment +-----------------+--------+--------+-------+ Origin             281      28           +-----------------+--------+--------+-------+ Proximal           157      17           +-----------------+--------+--------+-------+ Mid                 71      22           +-----------------+--------+--------+-------+ Distal              89      15           +-----------------+--------+--------+-------+  Technologist observations: Distal aorta was technically difficult to visualize due  to overlying bowel gas. Waveforms appeared stenotic. +------------+--------+--------+----+-----------+--------+--------+----+ Right KidneyPSV cm/sEDV cm/sRI  Left KidneyPSV cm/sEDV cm/sRI   +------------+--------+--------+----+-----------+--------+--------+----+ Upper Pole  29       6       0.79Upper Pole 28      9       0.68 +------------+--------+--------+----+-----------+--------+--------+----+ Mid         21      12      0.43Mid        29      12      0.60 +------------+--------+--------+----+-----------+--------+--------+----+ Lower Pole  21      10      0.54Lower Pole 28      10      0.63 +------------+--------+--------+----+-----------+--------+--------+----+ Hilar       37      7       0.80Hilar      27      9       0.65 +------------+--------+--------+----+-----------+--------+--------+----+ +------------------+-----+------------------+-----+ Right Kidney           Left Kidney             +------------------+-----+------------------+-----+ RAR                    RAR                     +------------------+-----+------------------+-----+ RAR (manual)      1.0  RAR (manual)      2.6   +------------------+-----+------------------+-----+ Cortex                 Cortex                  +------------------+-----+------------------+-----+ Cortex thickness       Corex thickness         +------------------+-----+------------------+-----+ Kidney length (cm)10.55Kidney length (cm)11.05 +------------------+-----+------------------+-----+  Summary: Renal:  Right: 1-59% stenosis of the right renal artery. Normal size right        kidney. Cyst(s) noted. Left:  Evidence 60 percent or less stenosis in the left renal artery        by velocities. Normal size of left kidney.  *See table(s) above for measurements and observations.  Diagnosing physician: Jamelle Haring  Electronically signed by Jamelle Haring on 02/24/2020 at 5:19:57 PM.    Final     Cardiac Studies    Right/Left Cardiac Catheterization 02/13/2020:   Non-stenotic Mid LAD lesion was previously treated.  Prox Cx to Mid Cx lesion is 20% stenosed.  Dist RCA lesion is 20% stenosed.  Non-stenotic Mid RCA lesion was previously treated.  Hemodynamic findings consistent  with mild pulmonary hypertension.  LV end diastolic pressure is mildly elevated.  1. Nonobstructive CAD. Continued excellent patency of stents in the LAD and RCA 2. Mildly elevated LV filling pressures 3. Mildly elevated right heart pressures. 4. Normal cardiac output  Plan: continue medical therapy. Diuresis. BP control.  Diagnostic Dominance: Right     _______________  Echocardiogram 02/17/2020: Impression 1. Compared with the echo 02/7739, systolic function is worse and the  pleural effusion is new.  2. Hypokinesis of the basal to mid anteroseptal, inferoseptal and  inferior myocardium. Left ventricular ejection fraction, by estimation, is  40 to 45%. The left ventricle has mildly decreased function. The left  ventricle demonstrates regional wall  motion abnormalities (see scoring diagram/findings for description). Left  ventricular diastolic parameters are consistent with Grade II diastolic  dysfunction (pseudonormalization). Elevated left ventricular end-diastolic  pressure.  3. Moderate pleural effusion in the left lateral region.  4. Mild to moderate mitral valve regurgitation.  5. Tricuspid valve regurgitation is moderate.  6. The aortic valve is tricuspid. Aortic valve regurgitation is mild.  7. There is moderately elevated pulmonary artery systolic pressure.  8. The inferior vena cava is dilated in size with <50% respiratory  variability, suggesting right atrial pressure of 15 mmHg.   Patient Profile     86 y.o. female  with acute on chronic combined systolic and diastolic heart failure.  Has CAD with prior STEMI status post DES to mid LAD in early January 2021 and then recent non-STEMI in December 2021 status post DES to RCA.  Cardiac catheterization as above shows patent stents.  EF has fluctuated slightly now 40 to 45%.  Assessment & Plan     Acute on chronic combined systolic and diastolic heart failure -Right and left heart catheterization  did show excellent patency of stents with mildly elevated left ventricular filling pressures.  Also had mildly elevated right-sided heart pressures.  Cardiac output appeared normal. -EF approximately 40 to 45%. -Now back on p.o. Lasix low-dose. -No ACE inhibitor or ARB or ARNI secondary to renal function. -On beta-blocker.  New onset atrial fibrillation/flutter -This has been paroxysmal. Episodes of A. fib with RVR with rates in the 120 ranges. Carvedilol up to 25 mg twice a day.    Back in sinus rhythm.  She is on p.o. amiodarone with plan of 400 mg twice a day for a week, 400 mg once a day for 2 weeks and then 200 mg a day thereafter. -She continues with Eliquis as well dose adjusted to 2.5 given age and renal function.  Demand ischemia from heart failure exacerbation -Troponin was 1700 peak when she was in the midst of her exertional dyspnea/heart failure episode.  Patent stents with no thrombotic disease noted.  Stable  Pleural effusion -Improved with thoracentesis on 1/27.  Appreciate critical care medicine.  Stable  Hypertension -Recently increased isosorbide to 90 mg.  Amlodipine previously led to edema stable pressures  Hyperlipidemia -LDL 83 goal less than 70. -Statin intolerance to Livalo.  PCSK9 was to expensive for her. -We will try low-dose Crestor 2.5 mg every other day.  Iron deficiency anemia -GI was consulted.  Felt more secondary to CKD.  Endoscopic evaluation not recommended at this time given heart failure.  Aspirin was stopped. -GI was okay with Eliquis being resumed. -They recommended Protonix 40 mg indefinitely. -Dr. Harrington Challenger did state that there would need to be a review use of Plavix in this setting as well given patent stents on cath this admit.  CKD stage IIIb - They were okay with continued dose of low-dose Lasix 20 mg a day.  Appreciate the assistance from nephrology.  Hyponatremia -Sodium. Monitor closely with diuretics.  Fluid restriction.  We  will go ahead and transfer.  For questions or updates, please contact [place] Please consult [email or web address] for contact info under        Signed, Candee Furbish, MD  02/26/2020, 9:19 AM

## 2020-02-27 ENCOUNTER — Ambulatory Visit: Payer: Medicare Other | Admitting: Family Medicine

## 2020-02-27 DIAGNOSIS — E785 Hyperlipidemia, unspecified: Secondary | ICD-10-CM

## 2020-02-27 DIAGNOSIS — I5043 Acute on chronic combined systolic (congestive) and diastolic (congestive) heart failure: Secondary | ICD-10-CM | POA: Diagnosis not present

## 2020-02-27 DIAGNOSIS — I10 Essential (primary) hypertension: Secondary | ICD-10-CM

## 2020-02-27 DIAGNOSIS — I214 Non-ST elevation (NSTEMI) myocardial infarction: Secondary | ICD-10-CM | POA: Diagnosis not present

## 2020-02-27 DIAGNOSIS — N183 Chronic kidney disease, stage 3 unspecified: Secondary | ICD-10-CM

## 2020-02-27 DIAGNOSIS — I2510 Atherosclerotic heart disease of native coronary artery without angina pectoris: Secondary | ICD-10-CM

## 2020-02-27 DIAGNOSIS — I255 Ischemic cardiomyopathy: Secondary | ICD-10-CM

## 2020-02-27 LAB — BASIC METABOLIC PANEL
Anion gap: 12 (ref 5–15)
BUN: 43 mg/dL — ABNORMAL HIGH (ref 8–23)
CO2: 23 mmol/L (ref 22–32)
Calcium: 8.3 mg/dL — ABNORMAL LOW (ref 8.9–10.3)
Chloride: 90 mmol/L — ABNORMAL LOW (ref 98–111)
Creatinine, Ser: 2.01 mg/dL — ABNORMAL HIGH (ref 0.44–1.00)
GFR, Estimated: 24 mL/min — ABNORMAL LOW (ref >60)
Glucose, Bld: 101 mg/dL — ABNORMAL HIGH (ref 70–99)
Potassium: 3.2 mmol/L — ABNORMAL LOW (ref 3.5–5.1)
Sodium: 125 mmol/L — ABNORMAL LOW (ref 135–145)

## 2020-02-27 LAB — GLUCOSE, CAPILLARY
Glucose-Capillary: 102 mg/dL — ABNORMAL HIGH (ref 70–99)
Glucose-Capillary: 115 mg/dL — ABNORMAL HIGH (ref 70–99)
Glucose-Capillary: 123 mg/dL — ABNORMAL HIGH (ref 70–99)
Glucose-Capillary: 170 mg/dL — ABNORMAL HIGH (ref 70–99)

## 2020-02-27 LAB — MAGNESIUM: Magnesium: 1.9 mg/dL (ref 1.7–2.4)

## 2020-02-27 MED ORDER — FUROSEMIDE 10 MG/ML IJ SOLN
40.0000 mg | Freq: Every day | INTRAMUSCULAR | Status: DC
  Administered 2020-02-27: 40 mg via INTRAVENOUS
  Filled 2020-02-27: qty 4

## 2020-02-27 MED ORDER — SALINE SPRAY 0.65 % NA SOLN
1.0000 | NASAL | Status: DC | PRN
  Administered 2020-02-28: 1 via NASAL
  Filled 2020-02-27: qty 44

## 2020-02-27 MED ORDER — POTASSIUM CHLORIDE CRYS ER 20 MEQ PO TBCR
20.0000 meq | EXTENDED_RELEASE_TABLET | Freq: Once | ORAL | Status: AC
  Administered 2020-02-27: 20 meq via ORAL
  Filled 2020-02-27: qty 1

## 2020-02-27 NOTE — Care Management Important Message (Signed)
Important Message  Patient Details  Name: Crystal [name] MRN: [id number] Date of Birth: April 27, 1934   Medicare Important Message Given:  Yes     [name] 02/27/2020, 4:33 PM

## 2020-02-27 NOTE — Progress Notes (Signed)
Progress Note  Patient Name: AZLYN WINGLER Date of Encounter: 02/27/2020  [facility] Cardiologist: Rozann Lesches, MD   Subjective   No CP; mild dyspnea  Inpatient Medications    Scheduled Meds: . amiodarone  400 mg Oral BID  . apixaban  2.5 mg Oral BID  . calcium-vitamin D  1 tablet Oral QAC lunch  . carvedilol  25 mg Oral BID WC  . Chlorhexidine Gluconate Cloth  6 each Topical Daily  . cholecalciferol  1,000 Units Oral Q breakfast  . clopidogrel  75 mg Oral q morning - 10a  . ferrous sulfate  325 mg Oral QAC lunch  . furosemide  40 mg Oral Daily  . hydrALAZINE  100 mg Oral Q8H  . insulin aspart  0-6 Units Subcutaneous TID WC  . isosorbide mononitrate  120 mg Oral QHS  . pantoprazole  40 mg Oral Daily  . polycarbophil  625 mg Oral Daily  . potassium chloride SA  40 mEq Oral Daily  . rosuvastatin  2.5 mg Oral Q48H  . saccharomyces boulardii  250 mg Oral BID  . sodium chloride flush  3 mL Intravenous Q12H   Continuous Infusions: . sodium chloride Stopped (02/26/20 0924)   PRN Meds: sodium chloride, acetaminophen, alum & mag hydroxide-simeth, bisacodyl, diazepam, doxepin, hydrALAZINE, magnesium hydroxide, nitroGLYCERIN, ondansetron (ZOFRAN) IV   Vital Signs    Vitals:   02/26/20 2008 02/27/20 0024 02/27/20 0406 02/27/20 0613  BP: (!) 145/70 (!) 154/49 (!) 167/43 114/81  Pulse: (!) 56 61 62   Resp: 18 17 18    Temp: 98.7 F (37.1 C) 99 F (37.2 C) 98.8 F (37.1 C)   TempSrc: Oral Oral Oral   SpO2: 97% 95% 96%   Weight:   67.8 kg   Height:        Intake/Output Summary (Last 24 hours) at 02/27/2020 0754 Last data filed at 02/27/2020 0600 Gross per 24 hour  Intake 63.62 ml  Output 1000 ml  Net -936.38 ml   Last 3 Weights 02/27/2020 02/26/2020 02/25/2020  Weight (lbs) 149 lb 7.6 oz 141 lb 5 oz 134 lb 4.2 oz  Weight (kg) 67.8 kg 64.1 kg 60.9 kg      Telemetry    Sinus - Personally Reviewed  Physical Exam   GEN: No acute distress.   Neck:  supple Cardiac: RRR, no murmurs, rubs, or gallops.  Respiratory: Diminished BS bases GI: Soft, nontender, non-distended  MS: 1+ thigh edema Neuro:  Nonfocal  Psych: Normal affect   Labs    High Sensitivity Troponin:   Recent Labs  Lab 02/12/20 1726 02/12/20 2300 02/13/20 1126 02/13/20 1330  TROPONINIHS 254* 1,735* 1,229* 1,055*      Chemistry Recent Labs  Lab 02/25/20 0046 02/26/20 0558 02/27/20 0209  NA 128* 128* 125*  K 3.9 2.9* 3.2*  CL 93* 93* 90*  CO2 24 25 23   GLUCOSE 95 102* 101*  BUN 45* 43* 43*  CREATININE 1.95* 2.02* 2.01*  CALCIUM 8.2* 8.0* 8.3*  GFRNONAA 25* 24* 24*  ANIONGAP 11 10 12      Hematology Recent Labs  Lab 02/23/20 0350 02/24/20 0036 02/25/20 0046  WBC 6.5 6.9 6.8  RBC 3.43* 3.57* 3.58*  HGB 9.4* 9.7* 9.8*  HCT 28.8* 30.3* 29.8*  MCV 84.0 84.9 83.2  MCH 27.4 27.2 27.4  MCHC 32.6 32.0 32.9  RDW 16.4* 16.4* 16.3*  PLT 275 292 281    BNP Recent Labs  Lab 02/23/20 0350  BNP 3,507.5*  Patient Profile     86 y.o. female with acute on chronic combined systolic/diastolic congestive heart failure.  Patient has had previous PCI of the LAD January 2021 and PCI of right coronary artery December 2021.  Cardiac catheterization this admission shows patent stents and nonobstructive coronary disease.  LV filling pressures mildly elevated.  Echocardiogram this admission shows ejection fraction 40 to 70%, grade 2 diastolic dysfunction, mild to moderate mitral regurgitation, moderate tricuspid regurgitation, mild aortic insufficiency.  Assessment & Plan    1 acute on chronic combined systolic/diastolic congestive heart failure-she remains volume overloaded.  Weight up to 149 pounds.  We will change Lasix to 40 mg IV daily.  Follow renal function and sodium closely.  2 hyponatremia-sodium has decreased to 125 today.  Fluid restrict to 1.2 L daily.  Follow sodium closely.  3 coronary artery disease-continue Plavix and statin.  4 paroxysmal  atrial fibrillation-patient remains in sinus rhythm.  Continue amiodarone load.  Continue apixaban.  5 acute on chronic stage III kidney disease-creatinine stable today.  We will continue to follow.  6 ischemic cardiomyopathy-continue hydralazine/nitrates.  No ARB or Entresto due to baseline renal insufficiency.  Continue beta-blocker.  7 pleural effusions-status post thoracentesis.  8 hypertension-we will continue present medications and follow.  For questions or updates, please contact [place] Please consult [email or web address] for contact info under        Signed, Kirk Ruths, MD  02/27/2020, 7:54 AM

## 2020-02-27 NOTE — Progress Notes (Signed)
Physical Therapy Treatment Patient Details Name: Crystal [name] MRN: [id number] DOB: 1934-04-27 Today's Date: 02/27/2020    History of Present Illness 86 y/o F, pt adm with NSTEMI and acute on chronic heart failure. PMH - CAD, PCI 12/2019, STEMI 01/2019, HTN, ckd, breast CA EF has fluctuated slightly now 40 to 45%.    PT Comments    Pt received in supine, agreeable to therapy session with encouragement, anxious but with good participation as able. Pt limited this session due to bowel urgency/incontinence, pt performed gait trials ~68ft x2 to/from toilet but fatigues quickly this date and pt c/o increased sinus drainage, 3/4 dyspnea and deferred longer gait trial. Of note, pt BP dropped significantly from supine to seated posture, after 3 minutes BP normalized but pt unable to stand long enough for standing BP assessment, will benefit from full orthostatic vitals assessment next session, RN notified. Orthostatic BP Supine 146/50 (79)  Sitting 86/69 (75)  Sitting after standing 1 minute 142/98 (112)  Standing Pt unable  Pt continues to benefit from PT services to progress toward functional mobility goals. Continue to recommend HHPT, pending progress.   Follow Up Recommendations  Home health PT;Supervision for mobility/OOB     Equipment Recommendations  Other (comment) (rollator)    Recommendations for Other Services       Precautions / Restrictions Precautions Precautions: Fall Precaution Comments: watch BP Restrictions Weight Bearing Restrictions: No    Mobility  Bed Mobility Overal bed mobility: Needs Assistance Bed Mobility: Supine to Sit     Supine to sit: Min guard     General bed mobility comments: Min guard for safety, increased time and use of rail. No dizziness.  Transfers Overall transfer level: Needs assistance Equipment used: Rolling walker (2 wheeled) Transfers: Sit to/from Stand Sit to Stand: Supervision;Min guard         General transfer  comment: Supervision for sit>stand from EOB x3 reps, min guard for stand to sit due to urinary urgency/slightly unsteady; improved technique when sitting down to chair from RW after toileting  Ambulation/Gait Ambulation/Gait assistance: Min guard Gait Distance (Feet): 20 Feet (x2 with seated break between bouts) Assistive device: Rolling walker (2 wheeled) Gait Pattern/deviations: Step-through pattern;Decreased stride length Gait velocity: decr   General Gait Details: Assist for safety and lines, pt with decreased awareness of lines and at times needs minA for RW management due to close quarters in room; deferred longer gait trial as pt fatigued after BSC/hygiene assist   Stairs             Wheelchair Mobility    Modified Rankin (Stroke Patients Only)       Balance Overall balance assessment: Needs assistance Sitting-balance support: No upper extremity supported;Feet supported Sitting balance-Leahy Scale: Good     Standing balance support: No upper extremity supported;During functional activity Standing balance-Leahy Scale: Fair                              Cognition Arousal/Alertness: Awake/alert Behavior During Therapy: Anxious Overall Cognitive Status: Within Functional Limits for tasks assessed                                 General Comments: pt with urgency to use BSC but did not give staff warning prior to walking toward it. Decreased safety awareness and poor carryover of cues for hand placement with transfers.  Exercises      General Comments General comments (skin integrity, edema, etc.): Amb on RA with dyspnea 2-3/4 and SpO2 98-100%      Pertinent Vitals/Pain Pain Assessment: No/denies pain    Home Living                      Prior Function            PT Goals (current goals can now be found in the care plan section) Acute Rehab PT Goals Patient Stated Goal: return home PT Goal Formulation: With  patient Time For Goal Achievement: 03/01/20 Potential to Achieve Goals: Good Progress towards PT goals: Progressing toward goals    Frequency    Min 3X/week      PT Plan Current plan remains appropriate    Co-evaluation              AM-PAC PT "6 Clicks" Mobility   Outcome Measure  Help needed turning from your back to your side while in a flat bed without using bedrails?: None Help needed moving from lying on your back to sitting on the side of a flat bed without using bedrails?: None Help needed moving to and from a bed to a chair (including a wheelchair)?: A Little Help needed standing up from a chair using your arms (e.g., wheelchair or bedside chair)?: None Help needed to walk in hospital room?: A Little Help needed climbing 3-5 steps with a railing? : A Little 6 Click Score: 21    End of Session Equipment Utilized During Treatment: Gait belt Activity Tolerance: Patient limited by fatigue Patient left: with call bell/phone within reach;in chair;with chair alarm set Nurse Communication: Mobility status PT Visit Diagnosis: Unsteadiness on feet (R26.81);Other abnormalities of gait and mobility (R26.89);Muscle weakness (generalized) (M62.81)     Time: 8921-1941 PT Time Calculation (min) (ACUTE ONLY): 36 min  Charges:  $Gait Training: 8-22 mins $Therapeutic Activity: 8-22 mins                     Murriel Eidem P., PTA Acute Rehabilitation Services Pager: [phone] Office: [place] 02/27/2020, 1:36 PM

## 2020-02-27 NOTE — Plan of Care (Signed)
  Problem: Clinical Measurements: Goal: Ability to maintain clinical measurements within normal limits will improve Outcome: Progressing   Problem: Coping: Goal: Level of anxiety will decrease Outcome: Progressing   

## 2020-02-28 ENCOUNTER — Inpatient Hospital Stay (HOSPITAL_COMMUNITY): Payer: Medicare Other

## 2020-02-28 DIAGNOSIS — I5043 Acute on chronic combined systolic (congestive) and diastolic (congestive) heart failure: Secondary | ICD-10-CM | POA: Diagnosis not present

## 2020-02-28 DIAGNOSIS — I214 Non-ST elevation (NSTEMI) myocardial infarction: Secondary | ICD-10-CM | POA: Diagnosis not present

## 2020-02-28 LAB — BASIC METABOLIC PANEL
Anion gap: 11 (ref 5–15)
BUN: 44 mg/dL — ABNORMAL HIGH (ref 8–23)
CO2: 23 mmol/L (ref 22–32)
Calcium: 8.3 mg/dL — ABNORMAL LOW (ref 8.9–10.3)
Chloride: 95 mmol/L — ABNORMAL LOW (ref 98–111)
Creatinine, Ser: 2.06 mg/dL — ABNORMAL HIGH (ref 0.44–1.00)
GFR, Estimated: 23 mL/min — ABNORMAL LOW (ref >60)
Glucose, Bld: 111 mg/dL — ABNORMAL HIGH (ref 70–99)
Potassium: 3.4 mmol/L — ABNORMAL LOW (ref 3.5–5.1)
Sodium: 129 mmol/L — ABNORMAL LOW (ref 135–145)

## 2020-02-28 LAB — MAGNESIUM: Magnesium: 1.9 mg/dL (ref 1.7–2.4)

## 2020-02-28 LAB — GLUCOSE, CAPILLARY
Glucose-Capillary: 138 mg/dL — ABNORMAL HIGH (ref 70–99)
Glucose-Capillary: 140 mg/dL — ABNORMAL HIGH (ref 70–99)
Glucose-Capillary: 160 mg/dL — ABNORMAL HIGH (ref 70–99)
Glucose-Capillary: 135 mg/dL — ABNORMAL HIGH (ref 70–99)

## 2020-02-28 MED ORDER — POTASSIUM CHLORIDE CRYS ER 20 MEQ PO TBCR
40.0000 meq | EXTENDED_RELEASE_TABLET | Freq: Once | ORAL | Status: AC
  Administered 2020-02-28: 40 meq via ORAL
  Filled 2020-02-28: qty 2

## 2020-02-28 MED ORDER — AMLODIPINE BESYLATE 5 MG PO TABS
5.0000 mg | ORAL_TABLET | Freq: Every day | ORAL | Status: DC
  Administered 2020-02-28 – 2020-03-04 (×6): 5 mg via ORAL
  Filled 2020-02-28 (×6): qty 1

## 2020-02-28 MED ORDER — MELATONIN 3 MG PO TABS
3.0000 mg | ORAL_TABLET | Freq: Every day | ORAL | Status: DC
  Administered 2020-02-28 – 2020-03-03 (×5): 3 mg via ORAL
  Filled 2020-02-28 (×5): qty 1

## 2020-02-28 MED ORDER — FUROSEMIDE 10 MG/ML IJ SOLN
40.0000 mg | Freq: Two times a day (BID) | INTRAMUSCULAR | Status: DC
  Administered 2020-02-28 – 2020-02-29 (×3): 40 mg via INTRAVENOUS
  Filled 2020-02-28 (×4): qty 4

## 2020-02-28 NOTE — Progress Notes (Addendum)
Progress Note  Patient Name: Crystal [name] Date of Encounter: 02/28/2020  [facility] Cardiologist: Rozann Lesches, MD   Subjective   Dyspnea worse this AM; no CP  Inpatient Medications    Scheduled Meds: . amiodarone  400 mg Oral BID  . apixaban  2.5 mg Oral BID  . calcium-vitamin D  1 tablet Oral QAC lunch  . carvedilol  25 mg Oral BID WC  . Chlorhexidine Gluconate Cloth  6 each Topical Daily  . cholecalciferol  1,000 Units Oral Q breakfast  . clopidogrel  75 mg Oral q morning - 10a  . ferrous sulfate  325 mg Oral QAC lunch  . furosemide  40 mg Intravenous Daily  . hydrALAZINE  100 mg Oral Q8H  . insulin aspart  0-6 Units Subcutaneous TID WC  . isosorbide mononitrate  120 mg Oral QHS  . pantoprazole  40 mg Oral Daily  . polycarbophil  625 mg Oral Daily  . potassium chloride SA  40 mEq Oral Daily  . rosuvastatin  2.5 mg Oral Q48H  . saccharomyces boulardii  250 mg Oral BID  . sodium chloride flush  3 mL Intravenous Q12H   Continuous Infusions: . sodium chloride Stopped (02/26/20 0924)   PRN Meds: sodium chloride, acetaminophen, alum & mag hydroxide-simeth, bisacodyl, diazepam, doxepin, hydrALAZINE, magnesium hydroxide, nitroGLYCERIN, ondansetron (ZOFRAN) IV, sodium chloride   Vital Signs    Vitals:   02/27/20 2100 02/27/20 2214 02/28/20 0500 02/28/20 0626  BP: (!) 163/46 (!) 163/46 (!) 164/66 (!) 190/56  Pulse: (!) 56  65   Resp: 18  18   Temp: 98.2 F (36.8 C)  97.9 F (36.6 C)   TempSrc: Oral  Oral   SpO2: 95%  98%   Weight:   63.5 kg   Height:        Intake/Output Summary (Last 24 hours) at 02/28/2020 0752 Last data filed at 02/27/2020 1826 Gross per 24 hour  Intake 720 ml  Output 200 ml  Net 520 ml   Last 3 Weights 02/28/2020 02/27/2020 02/26/2020  Weight (lbs) 140 lb 149 lb 7.6 oz 141 lb 5 oz  Weight (kg) 63.504 kg 67.8 kg 64.1 kg      Telemetry    Sinus - Personally Reviewed  Physical Exam   GEN: Dyspneic.  WD Neck: supple Cardiac:  RRR Respiratory: Diminished BS bases; no wheeze GI: Soft, NT/ND MS: 1-2+ thigh edema Neuro:  Grossly intact Psych: Normal affect   Labs    High Sensitivity Troponin:   Recent Labs  Lab 02/12/20 1726 02/12/20 2300 02/13/20 1126 02/13/20 1330  TROPONINIHS 254* 1,735* 1,229* 1,055*      Chemistry Recent Labs  Lab 02/26/20 0558 02/27/20 0209 02/28/20 0255  NA 128* 125* 129*  K 2.9* 3.2* 3.4*  CL 93* 90* 95*  CO2 25 23 23   GLUCOSE 102* 101* 111*  BUN 43* 43* 44*  CREATININE 2.02* 2.01* 2.06*  CALCIUM 8.0* 8.3* 8.3*  GFRNONAA 24* 24* 23*  ANIONGAP 10 12 11      Hematology Recent Labs  Lab 02/23/20 0350 02/24/20 0036 02/25/20 0046  WBC 6.5 6.9 6.8  RBC 3.43* 3.57* 3.58*  HGB 9.4* 9.7* 9.8*  HCT 28.8* 30.3* 29.8*  MCV 84.0 84.9 83.2  MCH 27.4 27.2 27.4  MCHC 32.6 32.0 32.9  RDW 16.4* 16.4* 16.3*  PLT 275 292 281    BNP Recent Labs  Lab 02/23/20 0350  BNP 3,507.5*     Patient Profile  86 y.o. female with acute on chronic combined systolic/diastolic congestive heart failure.  Patient has had previous PCI of the LAD January 2021 and PCI of right coronary artery December 2021.  Cardiac catheterization this admission shows patent stents and nonobstructive coronary disease.  LV filling pressures mildly elevated.  Echocardiogram this admission shows ejection fraction 40 to 69%, grade 2 diastolic dysfunction, mild to moderate mitral regurgitation, moderate tricuspid regurgitation, mild aortic insufficiency.  Assessment & Plan    1 acute on chronic combined systolic/diastolic congestive heart failure-patient symptomatically worse today.  Remains volume overloaded on examination.  Increase Lasix to 40 mg IV twice daily.  Follow renal function closely.  We will also check albumin tomorrow morning. Repeat chest xray.  2 hyponatremia-sodium increased to 129 today.  Continue fluid restriction.  Will follow.  3 coronary artery disease-continue Plavix and statin.  4  paroxysmal atrial fibrillation-patient remains in sinus rhythm.  Continue amiodarone load.  Continue apixaban.  5 acute on chronic stage III kidney disease-creatinine unchanged today.  We will continue to follow.  6 ischemic cardiomyopathy-continue hydralazine/nitrates.  No ARB or Entresto due to baseline renal insufficiency.  Continue beta-blocker.  7 pleural effusions-status post thoracentesis.  8 hypertension-blood pressure elevated.  Add amlodipine 5 mg daily and follow.  9 history of anemia-repeat CBC tomorrow morning.  For questions or updates, please contact [place] Please consult [email or web address] for contact info under        Signed, Kirk Ruths, MD  02/28/2020, 7:52 AM

## 2020-02-28 NOTE — Plan of Care (Signed)
  Problem: Education: Goal: Knowledge of General Education information will improve Description: Including pain rating scale, medication(s)/side effects and non-pharmacologic comfort measures Outcome: Progressing   Problem: Health Behavior/Discharge Planning: Goal: Ability to manage health-related needs will improve Outcome: Progressing   Problem: Clinical Measurements: Goal: Ability to maintain clinical measurements within normal limits will improve Outcome: Progressing Goal: Diagnostic test results will improve Outcome: Progressing Goal: Cardiovascular complication will be avoided Outcome: Progressing   Problem: Activity: Goal: Risk for activity intolerance will decrease Outcome: Progressing   Problem: Coping: Goal: Level of anxiety will decrease Outcome: Progressing   Problem: Elimination: Goal: Will not experience complications related to bowel motility Outcome: Progressing Goal: Will not experience complications related to urinary retention Outcome: Progressing   Problem: Pain Managment: Goal: General experience of comfort will improve Outcome: Progressing   Problem: Safety: Goal: Ability to remain free from injury will improve Outcome: Progressing   Problem: Skin Integrity: Goal: Risk for impaired skin integrity will decrease Outcome: Progressing   Problem: Education: Goal: Understanding of CV disease, CV risk reduction, and recovery process will improve Outcome: Progressing Goal: Individualized Educational Video(s) Outcome: Progressing   Problem: Activity: Goal: Ability to return to baseline activity level will improve Outcome: Progressing   Problem: Cardiovascular: Goal: Ability to achieve and maintain adequate cardiovascular perfusion will improve Outcome: Progressing Goal: Vascular access site(s) Level 0-1 will be maintained Outcome: Progressing   Problem: Health Behavior/Discharge Planning: Goal: Ability to safely manage health-related needs  after discharge will improve Outcome: Progressing

## 2020-02-29 DIAGNOSIS — I5043 Acute on chronic combined systolic (congestive) and diastolic (congestive) heart failure: Secondary | ICD-10-CM | POA: Diagnosis not present

## 2020-02-29 LAB — COMPREHENSIVE METABOLIC PANEL
ALT: 31 U/L (ref 0–44)
AST: 20 U/L (ref 15–41)
Albumin: 2.7 g/dL — ABNORMAL LOW (ref 3.5–5.0)
Alkaline Phosphatase: 72 U/L (ref 38–126)
Anion gap: 11 (ref 5–15)
BUN: 45 mg/dL — ABNORMAL HIGH (ref 8–23)
CO2: 23 mmol/L (ref 22–32)
Calcium: 8.5 mg/dL — ABNORMAL LOW (ref 8.9–10.3)
Chloride: 97 mmol/L — ABNORMAL LOW (ref 98–111)
Creatinine, Ser: 1.99 mg/dL — ABNORMAL HIGH (ref 0.44–1.00)
GFR, Estimated: 24 mL/min — ABNORMAL LOW (ref >60)
Glucose, Bld: 108 mg/dL — ABNORMAL HIGH (ref 70–99)
Potassium: 3.6 mmol/L (ref 3.5–5.1)
Sodium: 131 mmol/L — ABNORMAL LOW (ref 135–145)
Total Bilirubin: 0.9 mg/dL (ref 0.3–1.2)
Total Protein: 5.3 g/dL — ABNORMAL LOW (ref 6.5–8.1)

## 2020-02-29 LAB — CBC
HCT: 27.5 % — ABNORMAL LOW (ref 36.0–46.0)
Hemoglobin: 9.2 g/dL — ABNORMAL LOW (ref 12.0–15.0)
MCH: 27.5 pg (ref 26.0–34.0)
MCHC: 33.5 g/dL (ref 30.0–36.0)
MCV: 82.3 fL (ref 80.0–100.0)
Platelets: 261 10*3/uL (ref 150–400)
RBC: 3.34 MIL/uL — ABNORMAL LOW (ref 3.87–5.11)
RDW: 15.9 % — ABNORMAL HIGH (ref 11.5–15.5)
WBC: 4.4 10*3/uL (ref 4.0–10.5)
nRBC: 0 % (ref 0.0–0.2)

## 2020-02-29 LAB — GLUCOSE, CAPILLARY
Glucose-Capillary: 182 mg/dL — ABNORMAL HIGH (ref 70–99)
Glucose-Capillary: 159 mg/dL — ABNORMAL HIGH (ref 70–99)
Glucose-Capillary: 150 mg/dL — ABNORMAL HIGH (ref 70–99)
Glucose-Capillary: 152 mg/dL — ABNORMAL HIGH (ref 70–99)

## 2020-02-29 MED ORDER — AMIODARONE HCL 200 MG PO TABS
200.0000 mg | ORAL_TABLET | Freq: Every day | ORAL | Status: DC
  Administered 2020-02-29: 200 mg via ORAL
  Filled 2020-02-29: qty 1

## 2020-02-29 NOTE — Progress Notes (Signed)
Heart Failure Nurse Navigator Progress Note  PCP: Glenda Chroman, MD PCP-Cardiologist: Rozann Lesches, MD Admission Diagnosis: NSTEMI Admitted from: home alone--DC plans to live with nephew  Presentation:   [name] presented with CP and SHOB.   ECHO/ LVEF: 40-45%  Clinical Course:  Past Medical History:  Diagnosis Date  . Bell's palsy   . Breast cancer ([place]) 1998   Right mastectomy  . CAD (coronary artery disease)    a. s/p STEMI in 01/2019 with DES to mid-LAD  . CHF (congestive heart failure) ([place])    a. EF 30-35% by echo in 01/2019  . Essential hypertension   . GERD (gastroesophageal reflux disease)   . Gout   . History of skin cancer    Squamous cell, left shoulder  . Mixed hyperlipidemia   . Osteopenia   . Thyroid nodule   . Type 2 diabetes mellitus ([place])      Social History   Socioeconomic History  . Marital status: Divorced    Spouse name: Not on file  . Number of children: Not on file  . Years of education: Not on file  . Highest education level: Not on file  Occupational History  . Not on file  Tobacco Use  . Smoking status: Never Smoker  . Smokeless tobacco: Never Used  Vaping Use  . Vaping Use: Never used  Substance and Sexual Activity  . Alcohol use: Never  . Drug use: Never  . Sexual activity: Not on file  Other Topics Concern  . Not on file  Social History Narrative  . Not on file   Social Determinants of Health   Financial Resource Strain: Low Risk   . Difficulty of Paying Living Expenses: Not very hard  Food Insecurity: No Food Insecurity  . Worried About Charity fundraiser in the Last Year: Never true  . Ran Out of Food in the Last Year: Never true  Transportation Needs: No Transportation Needs  . Lack of Transportation (Medical): No  . Lack of Transportation (Non-Medical): No  Physical Activity: Not on file  Stress: Not on file  Social Connections: Not on file    High Risk Criteria for Readmission and/or Poor  Patient Outcomes:  Heart failure hospital admissions (last 6 months): 2   No Show rate: 0%  Difficult social situation: yes  Demonstrates medication adherence: yes  Primary Language: english  Literacy level: able to read/write. Able to comprehend.  Education Assessment and Provision:  Detailed education and instructions provided on heart failure disease management including the following:  Signs and symptoms of Heart Failure When to call the physician Importance of daily weights Low sodium diet Fluid restriction Medication management Anticipated future follow-up appointments  Patient education given on each of the above topics.  Patient acknowledges understanding via teach back method and acceptance of all instructions.  Education Materials:  "Living Better With Heart Failure" Booklet, HF zone tool, & Daily Weight Tracker Tool.  Patient has scale at home: yes Patient has pill box at home: yes   Barriers of Care:   -independence -mobility  Considerations/Referrals:   Referral made to Heart Failure Pharmacist Stewardship: yes, appreciated Referral made to [facility] TOC clinic: yes, Appt Monday 2/7 @ 11AM  Items for Follow-up on DC/TOC: -medication optimization/limitation d/t SCr.   Pricilla Holm, RN, BSN Heart Failure Nurse Navigator [facility] Team [phone]

## 2020-02-29 NOTE — Plan of Care (Signed)
  Problem: Education: Goal: Knowledge of General Education information will improve Description: Including pain rating scale, medication(s)/side effects and non-pharmacologic comfort measures Outcome: Progressing   Problem: Health Behavior/Discharge Planning: Goal: Ability to manage health-related needs will improve Outcome: Progressing   Problem: Clinical Measurements: Goal: Ability to maintain clinical measurements within normal limits will improve Outcome: Progressing Goal: Diagnostic test results will improve Outcome: Progressing Goal: Cardiovascular complication will be avoided Outcome: Progressing   Problem: Activity: Goal: Risk for activity intolerance will decrease Outcome: Progressing   Problem: Coping: Goal: Level of anxiety will decrease Outcome: Progressing   Problem: Elimination: Goal: Will not experience complications related to bowel motility Outcome: Progressing Goal: Will not experience complications related to urinary retention Outcome: Progressing   Problem: Pain Managment: Goal: General experience of comfort will improve Outcome: Progressing   Problem: Safety: Goal: Ability to remain free from injury will improve Outcome: Progressing   Problem: Skin Integrity: Goal: Risk for impaired skin integrity will decrease Outcome: Progressing   Problem: Education: Goal: Understanding of CV disease, CV risk reduction, and recovery process will improve Outcome: Progressing Goal: Individualized Educational Video(s) Outcome: Progressing   Problem: Activity: Goal: Ability to return to baseline activity level will improve Outcome: Progressing   Problem: Cardiovascular: Goal: Ability to achieve and maintain adequate cardiovascular perfusion will improve Outcome: Progressing Goal: Vascular access site(s) Level 0-1 will be maintained Outcome: Progressing   Problem: Health Behavior/Discharge Planning: Goal: Ability to safely manage health-related needs  after discharge will improve Outcome: Progressing

## 2020-02-29 NOTE — Progress Notes (Signed)
Progress Note  Patient Name: Crystal [name] Date of Encounter: 02/29/2020  [facility] Cardiologist: Rozann Lesches, MD   Subjective   Dyspnea improved compared to yesterday; no CP  Inpatient Medications    Scheduled Meds: . amiodarone  400 mg Oral BID  . amLODipine  5 mg Oral Daily  . apixaban  2.5 mg Oral BID  . calcium-vitamin D  1 tablet Oral QAC lunch  . carvedilol  25 mg Oral BID WC  . Chlorhexidine Gluconate Cloth  6 each Topical Daily  . cholecalciferol  1,000 Units Oral Q breakfast  . clopidogrel  75 mg Oral q morning - 10a  . ferrous sulfate  325 mg Oral QAC lunch  . furosemide  40 mg Intravenous BID  . hydrALAZINE  100 mg Oral Q8H  . insulin aspart  0-6 Units Subcutaneous TID WC  . isosorbide mononitrate  120 mg Oral QHS  . melatonin  3 mg Oral QHS  . pantoprazole  40 mg Oral Daily  . polycarbophil  625 mg Oral Daily  . potassium chloride SA  40 mEq Oral Daily  . rosuvastatin  2.5 mg Oral Q48H  . saccharomyces boulardii  250 mg Oral BID  . sodium chloride flush  3 mL Intravenous Q12H   Continuous Infusions: . sodium chloride Stopped (02/26/20 0924)   PRN Meds: sodium chloride, acetaminophen, alum & mag hydroxide-simeth, bisacodyl, diazepam, doxepin, hydrALAZINE, magnesium hydroxide, nitroGLYCERIN, ondansetron (ZOFRAN) IV, sodium chloride   Vital Signs    Vitals:   02/28/20 2131 02/29/20 0428 02/29/20 0638 02/29/20 0643  BP: (!) 170/52 (!) 166/55 (!) 182/57   Pulse:  61  65  Resp:  19    Temp:  (!) 97.5 F (36.4 C)    TempSrc:  Oral    SpO2:  98%  98%  Weight:    64.1 kg  Height:        Intake/Output Summary (Last 24 hours) at 02/29/2020 0726 Last data filed at 02/29/2020 0429 Gross per 24 hour  Intake 275 ml  Output 950 ml  Net -675 ml   Last 3 Weights 02/29/2020 02/28/2020 02/27/2020  Weight (lbs) 141 lb 5 oz 140 lb 149 lb 7.6 oz  Weight (kg) 64.1 kg 63.504 kg 67.8 kg      Telemetry    Sinus - Personally Reviewed  Physical Exam    GEN: WD, WN Neck: supple, no JVD Cardiac: RRR, no gallop Respiratory: Minimal basilar crackles GI: Soft, NT/ND, no masses MS: 1 thigh edema Neuro:  No focal findings Psych: Normal affect   Labs    High Sensitivity Troponin:   Recent Labs  Lab 02/12/20 1726 02/12/20 2300 02/13/20 1126 02/13/20 1330  TROPONINIHS 254* 1,735* 1,229* 1,055*      Chemistry Recent Labs  Lab 02/27/20 0209 02/28/20 0255 02/29/20 0308  NA 125* 129* 131*  K 3.2* 3.4* 3.6  CL 90* 95* 97*  CO2 23 23 23   GLUCOSE 101* 111* 108*  BUN 43* 44* 45*  CREATININE 2.01* 2.06* 1.99*  CALCIUM 8.3* 8.3* 8.5*  PROT  --   --  5.3*  ALBUMIN  --   --  2.7*  AST  --   --  20  ALT  --   --  31  ALKPHOS  --   --  72  BILITOT  --   --  0.9  GFRNONAA 24* 23* 24*  ANIONGAP 12 11 11      Hematology Recent Labs  Lab 02/24/20 0036 02/25/20  0046 02/29/20 0308  WBC 6.9 6.8 4.4  RBC 3.57* 3.58* 3.34*  HGB 9.7* 9.8* 9.2*  HCT 30.3* 29.8* 27.5*  MCV 84.9 83.2 82.3  MCH 27.2 27.4 27.5  MCHC 32.0 32.9 33.5  RDW 16.4* 16.3* 15.9*  PLT 292 281 261    BNP Recent Labs  Lab 02/23/20 0350  BNP 3,507.5*     Patient Profile     86 y.o. female with acute on chronic combined systolic/diastolic congestive heart failure.  Patient has had previous PCI of the LAD January 2021 and PCI of right coronary artery December 2021.  Cardiac catheterization this admission shows patent stents and nonobstructive coronary disease.  LV filling pressures mildly elevated.  Echocardiogram this admission shows ejection fraction 40 to 49%, grade 2 diastolic dysfunction, mild to moderate mitral regurgitation, moderate tricuspid regurgitation, mild aortic insufficiency.  Assessment & Plan    1 acute on chronic combined systolic/diastolic congestive heart failure-I/O-675; wt 64.1 kg.  Some improvement in symptoms today.  Continue Lasix at present dose and follow renal function.    2 hyponatremia-sodium slowly improving.  We will  follow.  3 coronary artery disease-continue Plavix and statin.  4 paroxysmal atrial fibrillation-patient remains in sinus rhythm.  Change amiodarone to 200 mg daily.  Continue apixaban.  5 acute on chronic stage III kidney disease-creatinine unchanged today.  We will continue to follow.  6 ischemic cardiomyopathy-continue hydralazine/nitrates.  No ARB or Entresto due to baseline renal insufficiency.  Continue beta-blocker.  7 pleural effusions-status post thoracentesis.  8 hypertension-blood pressure remains elevated.  Continue beta-blocker, hydralazine, nitrates and Lasix.  We will not add ARB at this point given recent acute renal insufficiency.  She has an allergy to clonidine.  We will continue low-dose amlodipine and follow for now.  9 history of anemia-hemoglobin unchanged.  Need to mobilize.  Hopefully can discharge in the next 48 hours.  For questions or updates, please contact [place] Please consult [email or web address] for contact info under        Signed, Kirk Ruths, MD  02/29/2020, 7:26 AM

## 2020-02-29 NOTE — Progress Notes (Signed)
Physical Therapy Treatment Patient Details Name: Crystal [name] MRN: [id number] DOB: [birth year] Today's Date: 02/29/2020    History of Present Illness 86 y/o F, pt adm with NSTEMI and acute on chronic heart failure. PMH - CAD, PCI 12/2019, STEMI 01/2019, HTN, ckd, breast CA EF has fluctuated slightly now 40 to 45%.    PT Comments    Pt in bed and agreeable to participate in therapy. Mostly flat this session, answers questions appropriately but otherwise remained silent during session. Once standing EOB, pt reported urgent need to urinate and transferred to [facility] for toileting. She performed peri care with supervision for standing and assist with gowns. Please see below for orthostatic vitals. By end of 3 min standing pt reported need to sit secondary to fatigue. Rn notified of orthostatic vitals and concerns for low diastolic BP. Feel pt will progress well once BP improves. Will continue to follow acutely.     02/29/20 1200  Orthostatic Lying   BP- Lying 148/50  Pulse- Lying 56  Orthostatic Sitting  BP- Sitting 136/62  Pulse- Sitting 57  Orthostatic Standing at 0 minutes  BP- Standing at 0 minutes (!) 117/23  Pulse- Standing at 0 minutes 60  Orthostatic Standing at 3 minutes  BP- Standing at 3 minutes (!) 105/33  Pulse- Standing at 3 minutes 59    Seated w/ LE elevated to recover  BP - Seated  at 3 min (!) 143/39  Pulse - Seated at 3 min 69     Follow Up Recommendations  Home health PT;Supervision for mobility/OOB     Equipment Recommendations  Other (comment) (rollator)    Recommendations for Other Services       Precautions / Restrictions Precautions Precautions: Fall Precaution Comments: watch BP Restrictions Weight Bearing Restrictions: No    Mobility  Bed Mobility Overal bed mobility: Needs Assistance Bed Mobility: Supine to Sit     Supine to sit: Supervision     General bed mobility comments: supervision for safety. Use of bed rails and HOB  elevated.  Transfers Overall transfer level: Needs assistance Equipment used: Rolling walker (2 wheeled) Transfers: Sit to/from [facility] Sit to Stand: Min assist;Min guard Stand pivot transfers: Min guard       General transfer comment: min guard for safety to stand from bed and again from [facility] min A required to stand final time from recliner chair to don mesh panties and pad. Pt able to pivot to [facility] with min guard.  Ambulation/Gait Ambulation/Gait assistance: Min guard Gait Distance (Feet): 4 Feet Assistive device: Rolling walker (2 wheeled) Gait Pattern/deviations: Step-through pattern;Decreased stride length     General Gait Details: min guard for safety as pt progressed from [facility] to recliner chair. Assist for line management.   Stairs             Wheelchair Mobility    Modified Rankin (Stroke Patients Only)       Balance Overall balance assessment: Needs assistance Sitting-balance support: No upper extremity supported;Feet supported Sitting balance-Leahy Scale: Good Sitting balance - Comments: Able to donn socks sitting sitting EOB with good balance and no assist required.   Standing balance support: No upper extremity supported;During functional activity Standing balance-Leahy Scale: Fair Standing balance comment: stood to perform peri care w/o assist                            Cognition Arousal/Alertness: Awake/alert Behavior During Therapy: Flat affect Overall Cognitive Status:  Within Functional Limits for tasks assessed                                        Exercises      General Comments        Pertinent Vitals/Pain Pain Assessment: No/denies pain    Home Living                      Prior Function            PT Goals (current goals can now be found in the care plan section) Acute Rehab PT Goals Patient Stated Goal: return home PT Goal Formulation: With patient Time For Goal  Achievement: 03/01/20 Potential to Achieve Goals: Good Progress towards PT goals: Progressing toward goals (slowly due to BP)    Frequency    Min 3X/week      PT Plan Current plan remains appropriate    Co-evaluation              AM-PAC PT "6 Clicks" Mobility   Outcome Measure  Help needed turning from your back to your side while in a flat bed without using bedrails?: None Help needed moving from lying on your back to sitting on the side of a flat bed without using bedrails?: None Help needed moving to and from a bed to a chair (including a wheelchair)?: A Little Help needed standing up from a chair using your arms (e.g., wheelchair or bedside chair)?: None Help needed to walk in hospital room?: A Little Help needed climbing 3-5 steps with a railing? : A Little 6 Click Score: 21    End of Session Equipment Utilized During Treatment: Gait belt Activity Tolerance: Patient limited by fatigue;Treatment limited secondary to medical complications (Comment) (orthostatic) Patient left: with call bell/phone within reach;in chair;with nursing/sitter in room Nurse Communication: Mobility status (orthostatic vitals) PT Visit Diagnosis: Unsteadiness on feet (R26.81);Other abnormalities of gait and mobility (R26.89);Muscle weakness (generalized) (M62.81)     Time: 6979-4801 PT Time Calculation (min) (ACUTE ONLY): 33 min  Charges:  $Therapeutic Activity: 23-37 mins                    Benjiman Core, [place] Pager [phone] Acute Rehab  Allena Katz 02/29/2020, 12:14 PM

## 2020-03-01 DIAGNOSIS — I214 Non-ST elevation (NSTEMI) myocardial infarction: Secondary | ICD-10-CM | POA: Diagnosis not present

## 2020-03-01 DIAGNOSIS — I480 Paroxysmal atrial fibrillation: Secondary | ICD-10-CM | POA: Diagnosis not present

## 2020-03-01 DIAGNOSIS — I5043 Acute on chronic combined systolic (congestive) and diastolic (congestive) heart failure: Secondary | ICD-10-CM | POA: Diagnosis not present

## 2020-03-01 LAB — BASIC METABOLIC PANEL
Anion gap: 11 (ref 5–15)
BUN: 45 mg/dL — ABNORMAL HIGH (ref 8–23)
CO2: 25 mmol/L (ref 22–32)
Calcium: 8.4 mg/dL — ABNORMAL LOW (ref 8.9–10.3)
Chloride: 95 mmol/L — ABNORMAL LOW (ref 98–111)
Creatinine, Ser: 1.87 mg/dL — ABNORMAL HIGH (ref 0.44–1.00)
GFR, Estimated: 26 mL/min — ABNORMAL LOW (ref >60)
Glucose, Bld: 109 mg/dL — ABNORMAL HIGH (ref 70–99)
Potassium: 2.9 mmol/L — ABNORMAL LOW (ref 3.5–5.1)
Sodium: 131 mmol/L — ABNORMAL LOW (ref 135–145)

## 2020-03-01 LAB — GLUCOSE, CAPILLARY
Glucose-Capillary: 115 mg/dL — ABNORMAL HIGH (ref 70–99)
Glucose-Capillary: 140 mg/dL — ABNORMAL HIGH (ref 70–99)
Glucose-Capillary: 173 mg/dL — ABNORMAL HIGH (ref 70–99)
Glucose-Capillary: 120 mg/dL — ABNORMAL HIGH (ref 70–99)

## 2020-03-01 MED ORDER — AMIODARONE HCL 200 MG PO TABS
400.0000 mg | ORAL_TABLET | Freq: Two times a day (BID) | ORAL | Status: DC
  Administered 2020-03-01 – 2020-03-04 (×7): 400 mg via ORAL
  Filled 2020-03-01 (×7): qty 2

## 2020-03-01 MED ORDER — TORSEMIDE 20 MG PO TABS
20.0000 mg | ORAL_TABLET | Freq: Every day | ORAL | Status: DC
  Administered 2020-03-01 – 2020-03-04 (×4): 20 mg via ORAL
  Filled 2020-03-01 (×4): qty 1

## 2020-03-01 MED ORDER — POTASSIUM CHLORIDE CRYS ER 20 MEQ PO TBCR
40.0000 meq | EXTENDED_RELEASE_TABLET | Freq: Once | ORAL | Status: AC
  Administered 2020-03-01: 40 meq via ORAL
  Filled 2020-03-01: qty 2

## 2020-03-01 MED ORDER — AMIODARONE HCL 150 MG/3ML IV SOLN: 150.0000 mg | Freq: Once | INTRAVENOUS | Status: DC

## 2020-03-01 MED ORDER — AMIODARONE IV BOLUS ONLY 150 MG/100ML
150.0000 mg | Freq: Once | INTRAVENOUS | Status: AC
  Administered 2020-03-01: 150 mg via INTRAVENOUS
  Filled 2020-03-01: qty 100

## 2020-03-01 NOTE — Progress Notes (Signed)
Progress Note  Patient Name: Crystal [name] Date of Encounter: 03/01/2020  [facility] Cardiologist: Rozann Lesches, MD   Subjective   Minimal dyspnea this AM; no CP  Inpatient Medications    Scheduled Meds: . amiodarone  200 mg Oral Daily  . amLODipine  5 mg Oral Daily  . apixaban  2.5 mg Oral BID  . calcium-vitamin D  1 tablet Oral QAC lunch  . carvedilol  25 mg Oral BID WC  . Chlorhexidine Gluconate Cloth  6 each Topical Daily  . cholecalciferol  1,000 Units Oral Q breakfast  . clopidogrel  75 mg Oral q morning - 10a  . ferrous sulfate  325 mg Oral QAC lunch  . furosemide  40 mg Intravenous BID  . hydrALAZINE  100 mg Oral Q8H  . insulin aspart  0-6 Units Subcutaneous TID WC  . isosorbide mononitrate  120 mg Oral QHS  . melatonin  3 mg Oral QHS  . pantoprazole  40 mg Oral Daily  . polycarbophil  625 mg Oral Daily  . potassium chloride SA  40 mEq Oral Daily  . rosuvastatin  2.5 mg Oral Q48H  . saccharomyces boulardii  250 mg Oral BID  . sodium chloride flush  3 mL Intravenous Q12H   Continuous Infusions: . sodium chloride Stopped (02/26/20 0924)  . amiodarone     PRN Meds: sodium chloride, acetaminophen, alum & mag hydroxide-simeth, bisacodyl, diazepam, doxepin, hydrALAZINE, magnesium hydroxide, nitroGLYCERIN, ondansetron (ZOFRAN) IV, sodium chloride   Vital Signs    Vitals:   02/29/20 1221 02/29/20 1306 02/29/20 2024 03/01/20 0601  BP:  (!) 129/39 (!) 151/48 (!) 180/50  Pulse:   60 68  Resp: 16  16 16   Temp:   97.7 F (36.5 C) (!) 97.4 F (36.3 C)  TempSrc:   Oral Oral  SpO2:   98% 97%  Weight:    61.1 kg  Height:        Intake/Output Summary (Last 24 hours) at 03/01/2020 0739 Last data filed at 02/29/2020 2020 Gross per 24 hour  Intake 220 ml  Output 700 ml  Net -480 ml   Last 3 Weights 03/01/2020 02/29/2020 02/28/2020  Weight (lbs) 134 lb 9.6 oz 141 lb 5 oz 140 lb  Weight (kg) 61.054 kg 64.1 kg 63.504 kg      Telemetry    Sinus converting  back to atrial fibrillation- Personally Reviewed  Physical Exam   GEN: WD, WN, NAD Neck: supple Cardiac: irregular Respiratory: Minimal basilar crackles; no wheeze GI: Soft, NT/ND MS: trace thigh edema Neuro:  Grossly intact Psych: Normal affect   Labs    High Sensitivity Troponin:   Recent Labs  Lab 02/12/20 1726 02/12/20 2300 02/13/20 1126 02/13/20 1330  TROPONINIHS 254* 1,735* 1,229* 1,055*      Chemistry Recent Labs  Lab 02/28/20 0255 02/29/20 0308 03/01/20 0210  NA 129* 131* 131*  K 3.4* 3.6 2.9*  CL 95* 97* 95*  CO2 23 23 25   GLUCOSE 111* 108* 109*  BUN 44* 45* 45*  CREATININE 2.06* 1.99* 1.87*  CALCIUM 8.3* 8.5* 8.4*  PROT  --  5.3*  --   ALBUMIN  --  2.7*  --   AST  --  20  --   ALT  --  31  --   ALKPHOS  --  72  --   BILITOT  --  0.9  --   GFRNONAA 23* 24* 26*  ANIONGAP 11 11 11      Hematology  Recent Labs  Lab 02/24/20 0036 02/25/20 0046 02/29/20 0308  WBC 6.9 6.8 4.4  RBC 3.57* 3.58* 3.34*  HGB 9.7* 9.8* 9.2*  HCT 30.3* 29.8* 27.5*  MCV 84.9 83.2 82.3  MCH 27.2 27.4 27.5  MCHC 32.0 32.9 33.5  RDW 16.4* 16.3* 15.9*  PLT 292 281 261      Patient Profile     86 y.o. female with acute on chronic combined systolic/diastolic congestive heart failure.  Patient has had previous PCI of the LAD January 2021 and PCI of right coronary artery December 2021.  Cardiac catheterization this admission shows patent stents and nonobstructive coronary disease.  LV filling pressures mildly elevated.  Echocardiogram this admission shows ejection fraction 40 to 54%, grade 2 diastolic dysfunction, mild to moderate mitral regurgitation, moderate tricuspid regurgitation, mild aortic insufficiency.  Assessment & Plan    1 acute on chronic combined systolic/diastolic congestive heart failure-I/O-480; wt 61.1 kg.  CHF continues to improve.  Change Lasix to Demadex 20 mg daily and follow.  2 hyponatremia-sodium 131 today.  Continue to follow.  3 coronary artery  disease-continue Plavix and statin.  4 paroxysmal atrial fibrillation-patient has developed recurrent atrial fibrillation this morning with minimally elevated heart rate.  We will give IV amiodarone bolus and increase amiodarone to 400 mg twice daily.  Hopefully with additional amiodarone she will convert back to sinus rhythm and hold sinus as amiodarone gets into her system.  Continue apixaban.  Continue carvedilol.  5 acute on chronic stage III kidney disease-creatinine unchanged today.  We will continue to follow.  6 ischemic cardiomyopathy-continue hydralazine/nitrates.  No ARB or Entresto due to baseline renal insufficiency.  Continue beta-blocker.  7 pleural effusions-status post thoracentesis.  8 hypertension-blood pressure with some improvement and amlodipine just added.  Will follow blood pressure and adjust as needed.  9 history of anemia-will follow Hgb.  10 hypokalemia-supplement.  Continue to mobilize.  For questions or updates, please contact [place] Please consult [email or web address] for contact info under        Signed, Kirk Ruths, MD  03/01/2020, 7:39 AM

## 2020-03-01 NOTE — Progress Notes (Signed)
Pt called nursing station reporting her HR was going up. Pt has converted back to afib rate is 90's-100's. Pt says she can feel her heart racing and feels sob. Placed 2L of O2 on pt and notified MD

## 2020-03-01 NOTE — Progress Notes (Signed)
Heart Failure Stewardship Pharmacist Progress Note   PCP: Glenda Chroman, MD PCP-Cardiologist: Rozann Lesches, MD    HPI:  86 yo F with PMH of CAD, CHF, HTN, and CKD. She presented to the ED with shortness of breath and chest pain. An ECHO was done on 02/17/20 and LVEF was 40-45% (50-55% in 12/2019). L/RHC done on 02/13/20 and found to have nonobstructive CAD and mildly elevated filling pressures.   Current HF Medications: Torsemide 20 mg daily Carvedilol 25 mg BID Hydralazine 100 mg q8h Imdur 120 mg daily  Prior to admission HF Medications: Furosemide 20 mg daily Carvedilol 25 mg BID Hydralazine 25 mg BID Imdur 30 mg daily  Pertinent Lab Values: . Serum creatinine 1.87, BUN 45, Potassium 2.9, Sodium 131, BNP 3507.5, Magnesium 1.9   Vital Signs: . Weight: 134 lbs (admission weight: 141 lbs) . Blood pressure: 120-180/50s  . Heart rate: 60-110s (in afib)  Medication Assistance / Insurance Benefits Check: Does the patient have prescription insurance?  Yes Type of insurance plan: [place]:  Prior to admission outpatient pharmacy: [facility] Is the patient willing to use [facility] pharmacy at discharge? Yes   Assessment: 1. Acute on chronic systolic CHF (EF 20-80%), due to ICM. NYHA class III symptoms. - Agree with transitioning to torsemide 20 mg daily today - would not continue furosemide at discharge as pt reports less efficacy with this prior to admission - Continue carvedilol 25 mg BID - Continue hydralazine 100 mg q8h - Continue Imdur 120 mg daily - No ACE/ARB/Entresto, spironolactone, or SGLT2i with CKD   Plan: 1) Medication changes recommended at this time: - Agree with transitioning to torsemide - Medication optimization limited due to CKD  2) Patient assistance application(s): - None pending  3)  Education  - To be completed prior to discharge  Kerby Nora, PharmD, BCPS Heart Failure Stewardship  Pharmacist Phone [phone]

## 2020-03-02 ENCOUNTER — Other Ambulatory Visit: Payer: Self-pay | Admitting: *Deleted

## 2020-03-02 DIAGNOSIS — I5043 Acute on chronic combined systolic (congestive) and diastolic (congestive) heart failure: Secondary | ICD-10-CM | POA: Diagnosis not present

## 2020-03-02 LAB — GLUCOSE, CAPILLARY
Glucose-Capillary: 169 mg/dL — ABNORMAL HIGH (ref 70–99)
Glucose-Capillary: 122 mg/dL — ABNORMAL HIGH (ref 70–99)
Glucose-Capillary: 154 mg/dL — ABNORMAL HIGH (ref 70–99)
Glucose-Capillary: 179 mg/dL — ABNORMAL HIGH (ref 70–99)

## 2020-03-02 LAB — BASIC METABOLIC PANEL
Anion gap: 12 (ref 5–15)
BUN: 42 mg/dL — ABNORMAL HIGH (ref 8–23)
CO2: 25 mmol/L (ref 22–32)
Calcium: 8.5 mg/dL — ABNORMAL LOW (ref 8.9–10.3)
Chloride: 97 mmol/L — ABNORMAL LOW (ref 98–111)
Creatinine, Ser: 1.87 mg/dL — ABNORMAL HIGH (ref 0.44–1.00)
GFR, Estimated: 26 mL/min — ABNORMAL LOW (ref >60)
Glucose, Bld: 102 mg/dL — ABNORMAL HIGH (ref 70–99)
Potassium: 3.6 mmol/L (ref 3.5–5.1)
Sodium: 134 mmol/L — ABNORMAL LOW (ref 135–145)

## 2020-03-02 LAB — MAGNESIUM: Magnesium: 1.7 mg/dL (ref 1.7–2.4)

## 2020-03-02 MED ORDER — MAGNESIUM SULFATE 2 GM/50ML IV SOLN
2.0000 g | Freq: Once | INTRAVENOUS | Status: AC
  Administered 2020-03-02: 2 g via INTRAVENOUS
  Filled 2020-03-02: qty 50

## 2020-03-02 NOTE — Progress Notes (Signed)
Per benefits check pts co pay for Eliquis is $74.96/ month. Pt is unsure if she can afford this with her other meds. She asked to be able to think about it.  TOC will follow.

## 2020-03-02 NOTE — Care Management Important Message (Signed)
Important Message  Patient Details  Name: Crystal [name] MRN: [id number] Date of Birth: [birth year]   Medicare Important Message Given:  Yes     [name] 03/02/2020, 11:20 AM

## 2020-03-02 NOTE — TOC Benefit Eligibility Note (Signed)
Transition of Care [facility]) Benefit Eligibility Note    Patient Details  Name: Crystal [name] MRN: [id number] Date of Birth: September 05, 1934   Medication/Dose: Arne Cleveland  2.5 MG BID  Covered?: Yes  Tier: 3 Drug  Prescription Coverage Preferred Pharmacy: [place] [facility] with Person/Company/Phone Number:: MELODY  @  [facility] RX # [phone]  Co-Pay: $74.96  Prior Approval: No  Deductible:  (NO DEDUCTIBLE WITHH PLAN)       Memory Argue Phone Number: 03/02/2020, 10:40 AM

## 2020-03-02 NOTE — Progress Notes (Signed)
Progress Note  Patient Name: ELISE KNOBLOCH Date of Encounter: 03/02/2020  [place] [facility] Cardiologist: Rozann Lesches, MD   Subjective   No CP; dyspnea has improved  Inpatient Medications    Scheduled Meds: . amiodarone  400 mg Oral BID  . amLODipine  5 mg Oral Daily  . apixaban  2.5 mg Oral BID  . calcium-vitamin D  1 tablet Oral QAC lunch  . carvedilol  25 mg Oral BID WC  . Chlorhexidine Gluconate Cloth  6 each Topical Daily  . cholecalciferol  1,000 Units Oral Q breakfast  . clopidogrel  75 mg Oral q morning - 10a  . ferrous sulfate  325 mg Oral QAC lunch  . hydrALAZINE  100 mg Oral Q8H  . insulin aspart  0-6 Units Subcutaneous TID WC  . isosorbide mononitrate  120 mg Oral QHS  . melatonin  3 mg Oral QHS  . pantoprazole  40 mg Oral Daily  . polycarbophil  625 mg Oral Daily  . potassium chloride SA  40 mEq Oral Daily  . rosuvastatin  2.5 mg Oral Q48H  . saccharomyces boulardii  250 mg Oral BID  . sodium chloride flush  3 mL Intravenous Q12H  . torsemide  20 mg Oral Daily   Continuous Infusions: . sodium chloride Stopped (02/26/20 0924)   PRN Meds: sodium chloride, acetaminophen, alum & mag hydroxide-simeth, bisacodyl, diazepam, doxepin, hydrALAZINE, magnesium hydroxide, nitroGLYCERIN, ondansetron (ZOFRAN) IV, sodium chloride   Vital Signs    Vitals:   03/01/20 1751 03/01/20 2110 03/02/20 0120 03/02/20 0436  BP: (!) 145/49 (!) 133/43 (!) 148/44 (!) 140/48  Pulse: 65   64  Resp:    18  Temp:  97.9 F (36.6 C)  98.1 F (36.7 C)  TempSrc:  Oral  Oral  SpO2:    96%  Weight:    60.9 kg  Height:        Intake/Output Summary (Last 24 hours) at 03/02/2020 0726 Last data filed at 03/02/2020 0457 Gross per 24 hour  Intake 360 ml  Output 1325 ml  Net -965 ml   Last 3 Weights 03/02/2020 03/01/2020 02/29/2020  Weight (lbs) 134 lb 3.2 oz 134 lb 9.6 oz 141 lb 5 oz  Weight (kg) 60.873 kg 61.054 kg 64.1 kg      Telemetry    Sinus - Personally Reviewed  Physical  Exam   GEN: WD, no acute distress Neck: supple, no JVD Cardiac: RRR Respiratory: Minimal basilar crackles; no rhonchi GI: Soft, NT/ND, no masses MS: trace thigh edema Neuro:  No focal findings Psych: Normal affect   Labs    High Sensitivity Troponin:   Recent Labs  Lab 02/12/20 1726 02/12/20 2300 02/13/20 1126 02/13/20 1330  TROPONINIHS 254* 1,735* 1,229* 1,055*      Chemistry Recent Labs  Lab 02/29/20 0308 03/01/20 0210 03/02/20 0326  NA 131* 131* 134*  K 3.6 2.9* 3.6  CL 97* 95* 97*  CO2 23 25 25   GLUCOSE 108* 109* 102*  BUN 45* 45* 42*  CREATININE 1.99* 1.87* 1.87*  CALCIUM 8.5* 8.4* 8.5*  PROT 5.3*  --   --   ALBUMIN 2.7*  --   --   AST 20  --   --   ALT 31  --   --   ALKPHOS 72  --   --   BILITOT 0.9  --   --   GFRNONAA 24* 26* 26*  ANIONGAP 11 11 12      Hematology Recent Labs  Lab 02/25/20 0046 02/29/20 0308  WBC 6.8 4.4  RBC 3.58* 3.34*  HGB 9.8* 9.2*  HCT 29.8* 27.5*  MCV 83.2 82.3  MCH 27.4 27.5  MCHC 32.9 33.5  RDW 16.3* 15.9*  PLT 281 261      Patient Profile     86 y.o. female with acute on chronic combined systolic/diastolic congestive heart failure.  Patient has had previous PCI of the LAD January 2021 and PCI of right coronary artery December 2021.  Cardiac catheterization this admission shows patent stents and nonobstructive coronary disease.  LV filling pressures mildly elevated.  Echocardiogram this admission shows ejection fraction 40 to 49%, grade 2 diastolic dysfunction, mild to moderate mitral regurgitation, moderate tricuspid regurgitation, mild aortic insufficiency.  Assessment & Plan    1 acute on chronic combined systolic/diastolic congestive heart failure-I/O-965; wt 60.9 kg.  Volume status has improved.  We will continue Demadex 20 mg daily and follow.  2 hyponatremia-sodium improved to 134.  3 coronary artery disease-continue Plavix and statin.  4 paroxysmal atrial fibrillation-patient has converted back to sinus  rhythm.  We will continue amiodarone 400 mg twice daily for 1 week then decrease to 200 mg daily thereafter.  Continue carvedilol and apixaban.   5 acute on chronic stage III kidney disease-creatinine unchanged today.  Continue to follow.  6 ischemic cardiomyopathy-continue hydralazine/nitrates.  No ARB or Entresto due to baseline renal insufficiency.  Continue beta-blocker.  7 pleural effusions-status post thoracentesis.  8 hypertension-blood pressure improving.  Continue present medical regimen.  Plan ambulation and mobilization.  Probable discharge next 24 to 48 hours as strength improves.  For questions or updates, please contact [place] Please consult [email or web address] for contact info under        Signed, Kirk Ruths, MD  03/02/2020, 7:26 AM

## 2020-03-02 NOTE — Progress Notes (Addendum)
Physical Therapy Treatment Patient Details Name: Crystal [name] MRN: [id number] DOB: [birth year] Today's Date: 03/02/2020    History of Present Illness 86 y/o F, pt adm with NSTEMI and acute on chronic heart failure. PMH - CAD, PCI 12/2019, STEMI 01/2019, HTN, ckd, breast CA EF has fluctuated slightly now 40 to 45%.    PT Comments    Patient progressing slowly towards PT goals. Improved ambulation distance with Min guard assist, RW and close chair follow for safety. Pt fatigues quickly with mobility demonstrating 2-3/4 DOE with activity. Needed 2 rest breaks during gait training due to above. No dizziness today and VSS. Encouraged mobility over the weekend when PT not here. Pt agreeable. POC updated. Goals remain appropriate. Will follow.   Follow Up Recommendations  Home health PT;Supervision for mobility/OOB     Equipment Recommendations  Other (comment) (rollator)    Recommendations for Other Services       Precautions / Restrictions Precautions Precautions: Fall Restrictions Weight Bearing Restrictions: No    Mobility  Bed Mobility Overal bed mobility: Needs Assistance Bed Mobility: Supine to Sit     Supine to sit: Supervision;HOB elevated     General bed mobility comments: supervision for safety. Use of bed rails and HOB elevated.  Transfers Overall transfer level: Needs assistance Equipment used: Rolling walker (2 wheeled) Transfers: Sit to/from Stand Sit to Stand: Mod assist;Min assist         General transfer comment: Min A to stand from EOB x1, from chair x2 with cues for hand placement/technique. Mod A to stand from low toilet with use of momentum.  Ambulation/Gait Ambulation/Gait assistance: Min guard Gait Distance (Feet): 15 Feet (+ 5' + 15' + 18') Assistive device: Rolling walker (2 wheeled) Gait Pattern/deviations: Step-through pattern;Decreased stride length;Trunk flexed Gait velocity: decr Gait velocity interpretation: <1.8 ft/sec, indicate of  risk for recurrent falls General Gait Details: Slow, mildly unsteady gait with cues for upright posture and RW proximity. 2-3./4 DOE. Fatigues quickly. 2 seated rest breaks.   Stairs             Wheelchair Mobility    Modified Rankin (Stroke Patients Only)       Balance Overall balance assessment: Needs assistance Sitting-balance support: Feet supported;No upper extremity supported Sitting balance-Leahy Scale: Good Sitting balance - Comments: Able to perform pericare on toilet sitting without LOB.   Standing balance support: During functional activity Standing balance-Leahy Scale: Poor Standing balance comment: Requires UE support for standing.                            Cognition Arousal/Alertness: Awake/alert Behavior During Therapy: Flat affect Overall Cognitive Status: No family/caregiver present to determine baseline cognitive functioning                                 General Comments: Follows commands; makes PT aware of urgent need to use bathroom prior to getting up today. Needs repetition of cues for safety at times.      Exercises      General Comments General comments (skin integrity, edema, etc.): Sp02 remained in high 90s on RA during activity.      Pertinent Vitals/Pain Pain Assessment: No/denies pain    Home Living                      Prior Function  PT Goals (current goals can now be found in the care plan section) Acute Rehab PT Goals Patient Stated Goal: to get better and go home PT Goal Formulation: With patient Time For Goal Achievement: 03/16/20 Potential to Achieve Goals: Good Progress towards PT goals: Progressing toward goals    Frequency    Min 3X/week      PT Plan Current plan remains appropriate    Co-evaluation              AM-PAC PT "6 Clicks" Mobility   Outcome Measure  Help needed turning from your back to your side while in a flat bed without using  bedrails?: None Help needed moving from lying on your back to sitting on the side of a flat bed without using bedrails?: None Help needed moving to and from a bed to a chair (including a wheelchair)?: A Little Help needed standing up from a chair using your arms (e.g., wheelchair or bedside chair)?: A Lot Help needed to walk in hospital room?: A Little Help needed climbing 3-5 steps with a railing? : A Little 6 Click Score: 19    End of Session Equipment Utilized During Treatment: Gait belt Activity Tolerance: Patient limited by fatigue Patient left: in chair;with call bell/phone within reach;with chair alarm set Nurse Communication: Mobility status PT Visit Diagnosis: Unsteadiness on feet (R26.81);Other abnormalities of gait and mobility (R26.89);Muscle weakness (generalized) (M62.81)     Time: 1017-1040 PT Time Calculation (min) (ACUTE ONLY): 23 min  Charges:  $Gait Training: 8-22 mins $Therapeutic Activity: 8-22 mins                     Marisa Severin, PT, DPT Acute Rehabilitation Services Pager [phone] Office [place] 03/02/2020, 11:44 AM

## 2020-03-02 NOTE — Progress Notes (Signed)
Heart Failure Stewardship Pharmacist Progress Note   PCP: Glenda Chroman, MD PCP-Cardiologist: Rozann Lesches, MD    HPI:  86 yo F with PMH of CAD, CHF, HTN, and CKD. She presented to the ED with shortness of breath and chest pain. An ECHO was done on 02/17/20 and LVEF was 40-45% (50-55% in 12/2019). L/RHC done on 02/13/20 and found to have nonobstructive CAD and mildly elevated filling pressures.   Current HF Medications: Torsemide 20 mg daily Carvedilol 25 mg BID Hydralazine 100 mg q8h Imdur 120 mg daily  Prior to admission HF Medications: Furosemide 20 mg daily Carvedilol 25 mg BID Hydralazine 25 mg BID Imdur 30 mg daily  Pertinent Lab Values: . Serum creatinine 1.87, BUN 42, Potassium 3.6, Sodium 134, BNP 3507.5, Magnesium 1.7   Vital Signs: . Weight: 134 lbs (admission weight: 141 lbs) . Blood pressure: 120-150/40-50s  . Heart rate: 50-60s  Medication Assistance / Insurance Benefits Check: Does the patient have prescription insurance?  Yes Type of insurance plan: [place]:  Prior to admission outpatient pharmacy: [facility] Is the patient willing to use [facility] at discharge? Yes   Assessment: 1. Acute on chronic systolic CHF (EF 36-64%), due to ICM. NYHA class III symptoms. - Continue torsemide 20 mg daily - would not continue furosemide at discharge as pt reports less efficacy with this prior to admission - Continue carvedilol 25 mg BID - Continue hydralazine 100 mg q8h - Continue Imdur 120 mg daily - No ACE/ARB/Entresto, spironolactone, or SGLT2i with CKD   Plan: 1) Medication changes recommended at this time: - Continue current regimen as above - Medication optimization limited due to CKD  2) Patient assistance application(s): - None pending  3)  Education  - To be completed prior to discharge  Kerby Nora, PharmD, BCPS Heart Failure Stewardship Pharmacist Phone [phone]

## 2020-03-03 DIAGNOSIS — I2510 Atherosclerotic heart disease of native coronary artery without angina pectoris: Secondary | ICD-10-CM | POA: Diagnosis not present

## 2020-03-03 DIAGNOSIS — I480 Paroxysmal atrial fibrillation: Secondary | ICD-10-CM | POA: Diagnosis not present

## 2020-03-03 DIAGNOSIS — I214 Non-ST elevation (NSTEMI) myocardial infarction: Secondary | ICD-10-CM | POA: Diagnosis not present

## 2020-03-03 DIAGNOSIS — N179 Acute kidney failure, unspecified: Secondary | ICD-10-CM | POA: Diagnosis not present

## 2020-03-03 LAB — BASIC METABOLIC PANEL
Anion gap: 12 (ref 5–15)
BUN: 47 mg/dL — ABNORMAL HIGH (ref 8–23)
CO2: 22 mmol/L (ref 22–32)
Calcium: 8.3 mg/dL — ABNORMAL LOW (ref 8.9–10.3)
Chloride: 98 mmol/L (ref 98–111)
Creatinine, Ser: 2.14 mg/dL — ABNORMAL HIGH (ref 0.44–1.00)
GFR, Estimated: 22 mL/min — ABNORMAL LOW (ref >60)
Glucose, Bld: 107 mg/dL — ABNORMAL HIGH (ref 70–99)
Potassium: 3.7 mmol/L (ref 3.5–5.1)
Sodium: 132 mmol/L — ABNORMAL LOW (ref 135–145)

## 2020-03-03 LAB — GLUCOSE, CAPILLARY
Glucose-Capillary: 188 mg/dL — ABNORMAL HIGH (ref 70–99)
Glucose-Capillary: 133 mg/dL — ABNORMAL HIGH (ref 70–99)
Glucose-Capillary: 144 mg/dL — ABNORMAL HIGH (ref 70–99)
Glucose-Capillary: 196 mg/dL — ABNORMAL HIGH (ref 70–99)

## 2020-03-03 NOTE — Progress Notes (Signed)
Progress Note  Patient Name: Crystal [name] Date of Encounter: 03/03/2020  Primary Cardiologist: Rozann Lesches, MD   Subjective   Doing well this morning.  She tells me she was up and moving yesterday and in the chair for at least 4 hours.  She thinks her breathing is significantly improved since her arrival to the hospital.  Legs also feel much improved.  Renal function did worsen slightly on today's checks.  Inpatient Medications    Scheduled Meds: . amiodarone  400 mg Oral BID  . amLODipine  5 mg Oral Daily  . apixaban  2.5 mg Oral BID  . calcium-vitamin D  1 tablet Oral QAC lunch  . carvedilol  25 mg Oral BID WC  . cholecalciferol  1,000 Units Oral Q breakfast  . clopidogrel  75 mg Oral q morning - 10a  . ferrous sulfate  325 mg Oral QAC lunch  . hydrALAZINE  100 mg Oral Q8H  . insulin aspart  0-6 Units Subcutaneous TID WC  . isosorbide mononitrate  120 mg Oral QHS  . melatonin  3 mg Oral QHS  . pantoprazole  40 mg Oral Daily  . polycarbophil  625 mg Oral Daily  . potassium chloride SA  40 mEq Oral Daily  . rosuvastatin  2.5 mg Oral Q48H  . saccharomyces boulardii  250 mg Oral BID  . sodium chloride flush  3 mL Intravenous Q12H  . torsemide  20 mg Oral Daily   Continuous Infusions: . sodium chloride Stopped (02/26/20 0924)   PRN Meds: sodium chloride, acetaminophen, alum & mag hydroxide-simeth, bisacodyl, diazepam, doxepin, hydrALAZINE, magnesium hydroxide, nitroGLYCERIN, ondansetron (ZOFRAN) IV, sodium chloride   Vital Signs    Vitals:   03/02/20 2030 03/02/20 2234 03/02/20 2235 03/03/20 0653  BP: (!) 137/45 (!) 130/50 (!) 130/50 (!) 154/41  Pulse: (!) 56  (!) 59 65  Resp: 16   18  Temp: 98 F (36.7 C)   98.2 F (36.8 C)  TempSrc: Oral   Oral  SpO2: 97%  97% 96%  Weight:    61 kg  Height:        Intake/Output Summary (Last 24 hours) at 03/03/2020 1018 Last data filed at 03/03/2020 0500 Gross per 24 hour  Intake 533 ml  Output 600 ml  Net -67 ml    Filed Weights   03/01/20 0601 03/02/20 0436 03/03/20 0653  Weight: 61.1 kg 60.9 kg 61 kg    Telemetry    Sinus rhythm- Personally Reviewed  ECG    No new- Personally Reviewed  Physical Exam   GEN: No acute distress.   Neck: No JVD Cardiac: RRR, no murmurs, rubs, or gallops.  Respiratory: Clear to auscultation bilaterally. GI: Soft, nontender, non-distended  MS: No edema; No deformity. Neuro:  Nonfocal  Psych: Normal affect   Labs    Chemistry Recent Labs  Lab 02/29/20 0308 03/01/20 0210 03/02/20 0326 03/03/20 0210  NA 131* 131* 134* 132*  K 3.6 2.9* 3.6 3.7  CL 97* 95* 97* 98  CO2 23 25 25 22   GLUCOSE 108* 109* 102* 107*  BUN 45* 45* 42* 47*  CREATININE 1.99* 1.87* 1.87* 2.14*  CALCIUM 8.5* 8.4* 8.5* 8.3*  PROT 5.3*  --   --   --   ALBUMIN 2.7*  --   --   --   AST 20  --   --   --   ALT 31  --   --   --   ALKPHOS 72  --   --   --  BILITOT 0.9  --   --   --   GFRNONAA 24* 26* 26* 22*  ANIONGAP 11 11 12 12      Hematology Recent Labs  Lab 02/29/20 0308  WBC 4.4  RBC 3.34*  HGB 9.2*  HCT 27.5*  MCV 82.3  MCH 27.5  MCHC 33.5  RDW 15.9*  PLT 261    Cardiac EnzymesNo results for input(s): TROPONINI in the last 168 hours. No results for input(s): TROPIPOC in the last 168 hours.   BNPNo results for input(s): BNP, PROBNP in the last 168 hours.   DDimer No results for input(s): DDIMER in the last 168 hours.   Radiology    No results found.  Cardiac Studies   No new    Assessment & Plan    Ms. Lasota is an 86 year old woman who was admitted with acute on chronic combined systolic and diastolic heart failure.  Her volume status is improved significantly with diuresis.  1.  Acute on chronic combined systolic and diastolic heart failure Warm and nearing euvolemia.  Creatinine worsened today suggesting may have reached her dry weight. Would like to watch her 24 more hours on an oral regimen to confirm stability prior to  discharge. Continue carvedilol, hydralazine, Imdur and torsemide  2.  Atrial fibrillation Maintaining sinus rhythm on amiodarone.  Plan to decrease to 200 mg once daily in 1 week.  3. Acute on CKD 3 Creatinine worsened today suggesting we may have reached her dry weight. We will repeat chemistry tomorrow to confirm stability  4.  Ischemic cardiomyopathy and coronary artery disease Continue statin and Plavix.  No ischemic symptoms.  Probable discharge Sunday.  For questions or updates, please contact [place] Please consult [email or web address] for contact info under Cardiology/STEMI.      Signed, Lars Mage, MD  03/03/2020, 10:18 AM

## 2020-03-04 DIAGNOSIS — I214 Non-ST elevation (NSTEMI) myocardial infarction: Secondary | ICD-10-CM | POA: Diagnosis not present

## 2020-03-04 LAB — BASIC METABOLIC PANEL
Anion gap: 13 (ref 5–15)
BUN: 42 mg/dL — ABNORMAL HIGH (ref 8–23)
CO2: 24 mmol/L (ref 22–32)
Calcium: 8.3 mg/dL — ABNORMAL LOW (ref 8.9–10.3)
Chloride: 98 mmol/L (ref 98–111)
Creatinine, Ser: 2.02 mg/dL — ABNORMAL HIGH (ref 0.44–1.00)
GFR, Estimated: 24 mL/min — ABNORMAL LOW (ref >60)
Glucose, Bld: 133 mg/dL — ABNORMAL HIGH (ref 70–99)
Potassium: 4 mmol/L (ref 3.5–5.1)
Sodium: 135 mmol/L (ref 135–145)

## 2020-03-04 LAB — GLUCOSE, CAPILLARY
Glucose-Capillary: 108 mg/dL — ABNORMAL HIGH (ref 70–99)
Glucose-Capillary: 131 mg/dL — ABNORMAL HIGH (ref 70–99)

## 2020-03-04 MED ORDER — ROSUVASTATIN CALCIUM 5 MG PO TABS: 2.5000 mg | ORAL_TABLET | ORAL | 0 refills | Status: DC

## 2020-03-04 MED ORDER — ISOSORBIDE MONONITRATE ER 120 MG PO TB24: 120.0000 mg | ORAL_TABLET | Freq: Every day | ORAL | 2 refills | Status: DC

## 2020-03-04 MED ORDER — APIXABAN 2.5 MG PO TABS: 2.5000 mg | ORAL_TABLET | Freq: Two times a day (BID) | ORAL | 2 refills | Status: DC

## 2020-03-04 MED ORDER — POTASSIUM CHLORIDE CRYS ER 20 MEQ PO TBCR: 40.0000 meq | EXTENDED_RELEASE_TABLET | Freq: Every day | ORAL | 2 refills | Status: DC

## 2020-03-04 MED ORDER — AMIODARONE HCL 200 MG PO TABS: ORAL_TABLET | ORAL | 0 refills | Status: DC

## 2020-03-04 MED ORDER — TORSEMIDE 20 MG PO TABS: 20.0000 mg | ORAL_TABLET | Freq: Every day | ORAL | 0 refills | Status: DC

## 2020-03-04 MED ORDER — HYDRALAZINE HCL 100 MG PO TABS: 100.0000 mg | ORAL_TABLET | Freq: Three times a day (TID) | ORAL | 1 refills | Status: DC

## 2020-03-04 MED ORDER — APIXABAN 2.5 MG PO TABS: 2.5000 mg | ORAL_TABLET | Freq: Two times a day (BID) | ORAL | 0 refills | Status: DC

## 2020-03-04 MED ORDER — AMIODARONE HCL 200 MG PO TABS: ORAL_TABLET | ORAL | 1 refills | Status: DC

## 2020-03-04 MED ORDER — ISOSORBIDE MONONITRATE ER 120 MG PO TB24: 120.0000 mg | ORAL_TABLET | Freq: Every day | ORAL | 0 refills | Status: DC

## 2020-03-04 MED ORDER — AMLODIPINE BESYLATE 5 MG PO TABS: 5.0000 mg | ORAL_TABLET | Freq: Every day | ORAL | 2 refills | Status: DC

## 2020-03-04 NOTE — Plan of Care (Signed)
  Problem: Education: Goal: Knowledge of General Education information will improve Description: Including pain rating scale, medication(s)/side effects and non-pharmacologic comfort measures Outcome: Progressing   Problem: Health Behavior/Discharge Planning: Goal: Ability to manage health-related needs will improve Outcome: Progressing   Problem: Activity: Goal: Risk for activity intolerance will decrease Outcome: Progressing   Problem: Coping: Goal: Level of anxiety will decrease Outcome: Progressing   Problem: Safety: Goal: Ability to remain free from injury will improve Outcome: Progressing   Problem: Education: Goal: Understanding of CV disease, CV risk reduction, and recovery process will improve Outcome: Progressing

## 2020-03-04 NOTE — Progress Notes (Signed)
Progress Note  Patient Name: Crystal [name] Date of Encounter: 03/04/2020  Primary Cardiologist: Rozann Lesches, MD   Subjective   Doing well this morning.   Hoping for discharge later today.   Inpatient Medications    Scheduled Meds: . amiodarone  400 mg Oral BID  . amLODipine  5 mg Oral Daily  . apixaban  2.5 mg Oral BID  . calcium-vitamin D  1 tablet Oral QAC lunch  . carvedilol  25 mg Oral BID WC  . cholecalciferol  1,000 Units Oral Q breakfast  . clopidogrel  75 mg Oral q morning - 10a  . ferrous sulfate  325 mg Oral QAC lunch  . hydrALAZINE  100 mg Oral Q8H  . insulin aspart  0-6 Units Subcutaneous TID WC  . isosorbide mononitrate  120 mg Oral QHS  . melatonin  3 mg Oral QHS  . pantoprazole  40 mg Oral Daily  . polycarbophil  625 mg Oral Daily  . potassium chloride SA  40 mEq Oral Daily  . rosuvastatin  2.5 mg Oral Q48H  . saccharomyces boulardii  250 mg Oral BID  . sodium chloride flush  3 mL Intravenous Q12H  . torsemide  20 mg Oral Daily   Continuous Infusions: . sodium chloride Stopped (02/26/20 0924)   PRN Meds: sodium chloride, acetaminophen, alum & mag hydroxide-simeth, bisacodyl, diazepam, doxepin, hydrALAZINE, magnesium hydroxide, nitroGLYCERIN, ondansetron (ZOFRAN) IV, sodium chloride   Vital Signs    Vitals:   03/03/20 2141 03/04/20 0604 03/04/20 0643 03/04/20 0812  BP: (!) 140/59 (!) 143/46 140/60   Pulse: (!) 59 65  97  Resp:  16    Temp:  97.9 F (36.6 C)    TempSrc:  Oral    SpO2:  97%  96%  Weight:  59.7 kg    Height:        Intake/Output Summary (Last 24 hours) at 03/04/2020 0952 Last data filed at 03/04/2020 9675 Gross per 24 hour  Intake 723 ml  Output 900 ml  Net -177 ml   Filed Weights   03/02/20 0436 03/03/20 0653 03/04/20 0604  Weight: 60.9 kg 61 kg 59.7 kg    Telemetry    Sinus rhythm- Personally Reviewed  ECG    No new- Personally Reviewed  Physical Exam   GEN: No acute distress.   Neck: No JVD Cardiac:  RRR, no murmurs, rubs, or gallops.  Respiratory: Clear to auscultation bilaterally. GI: Soft, nontender, non-distended  MS: No edema; No deformity. Neuro:  Nonfocal  Psych: Normal affect   Labs    Chemistry Recent Labs  Lab 02/29/20 0308 03/01/20 0210 03/02/20 0326 03/03/20 0210  NA 131* 131* 134* 132*  K 3.6 2.9* 3.6 3.7  CL 97* 95* 97* 98  CO2 23 25 25 22   GLUCOSE 108* 109* 102* 107*  BUN 45* 45* 42* 47*  CREATININE 1.99* 1.87* 1.87* 2.14*  CALCIUM 8.5* 8.4* 8.5* 8.3*  PROT 5.3*  --   --   --   ALBUMIN 2.7*  --   --   --   AST 20  --   --   --   ALT 31  --   --   --   ALKPHOS 72  --   --   --   BILITOT 0.9  --   --   --   GFRNONAA 24* 26* 26* 22*  ANIONGAP 11 11 12 12      Hematology Recent Labs  Lab 02/29/20 0308  WBC  4.4  RBC 3.34*  HGB 9.2*  HCT 27.5*  MCV 82.3  MCH 27.5  MCHC 33.5  RDW 15.9*  PLT 261    Cardiac EnzymesNo results for input(s): TROPONINI in the last 168 hours. No results for input(s): TROPIPOC in the last 168 hours.   BNPNo results for input(s): BNP, PROBNP in the last 168 hours.   DDimer No results for input(s): DDIMER in the last 168 hours.   Radiology    No results found.  Cardiac Studies   No new    Assessment & Plan    Crystal [name] is an 86 year old woman who was admitted with acute on chronic combined systolic and diastolic heart failure.  Her volume status is improved significantly with diuresis.  1.  Acute on chronic combined systolic and diastolic heart failure Warm and nearing euvolemia.   Continue carvedilol, hydralazine, Imdur and torsemide Repeat chemistry pending this AM. If stable renal function, OK to discharge today.  2.  Atrial fibrillation Maintaining sinus rhythm on amiodarone.  Plan to decrease to 200 mg once daily in 1 week (03/10/2020).  3. Acute on CKD 3 We will repeat chemistry this morning to confirm stability prior to discharge  4.  Ischemic cardiomyopathy and coronary artery  disease Continue statin and Plavix.  No ischemic symptoms.  Probable discharge this afternoon.  For questions or updates, please contact [place] Please consult [email or web address] for contact info under Cardiology/STEMI.      Signed, Lars Mage, MD  03/04/2020, 9:52 AM

## 2020-03-04 NOTE — Discharge Summary (Addendum)
Discharge Summary    Patient ID: Crystal [name] MRN: [id number]; DOB: [birth year]  Admit date: 02/12/2020 Discharge date: 03/04/2020   Primary Care Provider: Glenda Chroman, MD  Primary Cardiologist: Rozann Lesches, MD  Primary Electrophysiologist:  None   Discharge Diagnoses    Principal Problem:   Non-ST elevation (NSTEMI) myocardial infarction [facility]) Active Problems:   Diabetes mellitus ([place])   Hyperlipidemia   NSTEMI (non-ST elevated myocardial infarction) ([place])   CAD (coronary artery disease)   Acute on chronic combined systolic and diastolic CHF (congestive heart failure) (H[facility])   AF (paroxysmal atrial fibrillation) (H[facility])   Pleural effusion   Acute kidney injury superimposed on chronic kidney disease ([place])   Shortness of breath   Flash pulmonary edema ([place])   Hypertensive urgency   Diagnostic Studies/Procedures    Cath: 02/13/20    Non-stenotic Mid LAD lesion was previously treated.  Prox Cx to Mid Cx lesion is 20% stenosed.  Dist RCA lesion is 20% stenosed.  Non-stenotic Mid RCA lesion was previously treated.  Hemodynamic findings consistent with mild pulmonary hypertension.  LV end diastolic pressure is mildly elevated.   1. Nonobstructive CAD. Continued excellent patency of stents in the LAD and RCA 2. Mildly elevated LV filling pressures 3. Mildly elevated right heart pressures. 4. Normal cardiac output  Plan: continue medical therapy. Diuresis. BP control.  Diagnostic Dominance: Right   Echo: 02/17/20  IMPRESSIONS    1. Compared with the echo 09/3233, systolic function is worse and the  pleural effusion is new.  2. Hypokinesis of the basal to mid anteroseptal, inferoseptal and  inferior myocardium. Left ventricular ejection fraction, by estimation, is  40 to 45%. The left ventricle has mildly decreased function. The left  ventricle demonstrates regional wall  motion abnormalities (see scoring diagram/findings for description). Left   ventricular diastolic parameters are consistent with Grade II diastolic  dysfunction (pseudonormalization). Elevated left ventricular end-diastolic  pressure.  3. Moderate pleural effusion in the left lateral region.  4. Mild to moderate mitral valve regurgitation.  5. Tricuspid valve regurgitation is moderate.  6. The aortic valve is tricuspid. Aortic valve regurgitation is mild.  7. There is moderately elevated pulmonary artery systolic pressure.  8. The inferior vena cava is dilated in size with <50% respiratory  variability, suggesting right atrial pressure of 15 mmHg.  _____________   History of Present Illness     Crystal [name] is a 86 y.o. female with CAD with recent PCI in 12/2019 (PCI to RCA) and STEMI in 01/2019 (PCI LAD), chronic systolic HF (recovery of EF to 50%), HTN, CKD who presented with exertional shortness of breath and found to have NSTEMI.  Crystal [name] first began having gradual onset of shortness of breath over the last 2 days prior to admission. It has been significantly worse over the last 12 hours especially with exertion or any attempts at ambulation. She had been having progressive exertional dyspnea and jaw pain over the last few weeks, but overnight leading up to admission was the worst she has had. Has also had night sweats. She had not had any chest pain otherwise. She denied any fevers, chills, cough, nausea, vomiting, diaphoreisis. She was having occasional palpitations. No sick contacts. Lives alone and recently struggling with iADLs due to SOB.   On arrival to ED, she was markedly hypertensive with BP as high as 216/105. HR 90s. Afebrile. WBC 12.1, Hgb 10.2, sCr 1.66, troponin 254 -> 1735. COVID negative by PCR. BNP > 4000.  She  was admitted to cardiology for further management and plans undergo cardiac cath and further diuresis.  Hospital Course     Consultants: PCCM, Nephrology, GI  1.  Acute on chronic combined systolic and diastolic heart  failure: Underwent right and left heart catheterization that did show patency of previously placed stents with mildly elevated left ventricular filling pressures.  Also had mildly elevated right heart pressures.  The cardiac output appeared normal.  EF was noted at 40 to 45%.  She treated with IV Lasix, which was held in the setting of transient worsening renal function. Nephrology was consulted. Creatinine did improve and she was placed back on oral diuretics but transitioned from lasix to torsemide which was more effective. --She was net -7.2 L with a discharge weight of 131 pounds --Continued on carvedilol 25 mg twice daily, amlodipine 5 mg daily, hydralazine 100 mg 3 times daily and Imdur 120 mg daily --Discharge dose of diuretic was torsemide 20 mg daily, she is instructed to monitor I's and O's and weigh daily.  2.  Paroxysmal atrial fibrillation: Intermittent episodes during admission, but able to maintain sinus rhythm greater than 48 hours prior to discharge.  She was initially placed on IV amiodarone which was transitioned to 400 mg twice daily for 1 week then down to 200mg  daily  --Transition from IV heparin to Eliquis 2.5 mg twice daily (reduced dose given age and renal function)  3.  Demand ischemia: High-sensitivity troponin peaked at 1700 in the setting of exertional dyspnea and heart failure.  As above left heart cath with patent stents.   --Continued on medical therapy  4. CAD: s/p PCI/DES x1 to mid RCA on 01/16/2020. As noted above patent stents on cardiac cath this admission. Her aspirin was stopped given the need for Eliquis.  --She is continued on Plavix, statin dosed every other day, Imdur and beta-blocker  5.  Iron deficiency anemia: Hemoglobin declined to 7.9, noted to be Hemoccult positive.  GI consulted and felt anemia was most likely secondary to acute on chronic kidney disease.  Endoscopic evaluation was not recommended in the setting of active cardiac issues.  Her aspirin  was stopped, and she was cleared to start on Eliquis.  Hemoglobin remained stable around 9 without the need for transfusion. --Recommendations to continue on Protonix 40 mg daily indefinitely --Outpatient follow-up with Dr. Gala Romney in 1 month  6.  Hypertension: Blood pressure was noted to be markedly elevated intermittently throughout admission.  Medications were adjusted as appropriate --Plan to discharge on Coreg 25 mg twice daily, hydralazine 100 mg 3 times daily, amlodipine 5 mg daily and Imdur 120 mg daily  7.  Hyperlipidemia: LDL 83, HDL 57, triglycerides 74.  Noted prior statin intolerance to pravastatin and PCSK9 too expensive --We will continue on Crestor 2.5 mg every other day as patient was agreeable to this plan  8.  Acute on chronic CKD stage III: Creatinine peaked at 3.47 on 1/22.  Nephrology was consulted.  Renal ultrasound unremarkable and urinalysis with minimal protein and no bacteria.  Bladder scan showed no urinary retention.  It was felt elevation in creatinine was likely secondary to contrast nephropathy in association with hemodynamic changes in the setting of atrial fibrillation with RVR and CHF/cardiorenal syndrome.  --Creatinine was stable at 2.02 on the day of discharge --Outpatient follow-up with nephrology was arranged via [place] prior to discharge  9.  Hyponatremia: Sodium 129 on admission and improved with diuresis then dropped as low as 126.  Did improve within  normal limits prior to discharge  10.  Pleural effusions: Developed flush pulmonary edema 1/27, required Bipap and was transferred to the ICU. PCCM consulted and underwent bilateral thoracentesis on 1/27 with significant improvement.   Patient was seen and examined by Dr. Quentin Ore and determined stable for discharge. Follow-up appointment message was sent to [place] office for scheduling within 1 week.  Did the patient have an acute coronary syndrome (MI, NSTEMI, STEMI, etc) this admission?:  No.   The  elevated Troponin was due to the acute medical illness (demand ischemia).  ____________  Discharge Vitals Blood pressure 140/60, pulse 97, temperature 97.9 F (36.6 C), temperature source Oral, resp. rate 16, height 5\' 5"  (1.651 m), weight 59.7 kg, SpO2 96 %.  Filed Weights   03/02/20 0436 03/03/20 0653 03/04/20 0604  Weight: 60.9 kg 61 kg 59.7 kg    Labs & Radiologic Studies    CBC No results for input(s): WBC, NEUTROABS, HGB, HCT, MCV, PLT in the last 72 hours. Basic Metabolic Panel Recent Labs    03/02/20 0326 03/03/20 0210 03/04/20 1121  NA 134* 132* 135  K 3.6 3.7 4.0  CL 97* 98 98  CO2 25 22 24   GLUCOSE 102* 107* 133*  BUN 42* 47* 42*  CREATININE 1.87* 2.14* 2.02*  CALCIUM 8.5* 8.3* 8.3*  MG 1.7  --   --    Liver Function Tests No results for input(s): AST, ALT, ALKPHOS, BILITOT, PROT, ALBUMIN in the last 72 hours. No results for input(s): LIPASE, AMYLASE in the last 72 hours. High Sensitivity Troponin:   Recent Labs  Lab 02/12/20 1726 02/12/20 2300 02/13/20 1126 02/13/20 1330  TROPONINIHS 254* 1,735* 1,229* 1,055*    BNP Invalid input(s): POCBNP D-Dimer No results for input(s): DDIMER in the last 72 hours. Hemoglobin A1C No results for input(s): HGBA1C in the last 72 hours. Fasting Lipid Panel No results for input(s): CHOL, HDL, LDLCALC, TRIG, CHOLHDL, LDLDIRECT in the last 72 hours. Thyroid Function Tests No results for input(s): TSH, T4TOTAL, T3FREE, THYROIDAB in the last 72 hours.  Invalid input(s): FREET3 _____________  DG Chest 1 View  Result Date: 02/23/2020 CLINICAL DATA:  Status post thoracentesis. EXAM: CHEST  1 VIEW COMPARISON:  02/23/2020 FINDINGS: Heart size is within normal limits. Mild pulmonary vascular congestion. Blunted left lateral costophrenic angle likely due to small pleural effusion. Exam is limited due to exclusion of the lung apices and multiple overlying support structures. IMPRESSION: 1. Interval decrease of bilateral pleural  effusions. Small effusion limb aimed on the left. 2. Exam is limited due to multiple overlying support structures as well as exclusion of the lung apices. Very small apical pneumothorax is not excluded. Electronically Signed   By: Miachel Roux M.D.   On: 02/23/2020 11:36   DG Chest 2 View  Result Date: 02/17/2020 CLINICAL DATA:  Shortness of breath. EXAM: CHEST - 2 VIEW COMPARISON:  February 12, 2020. FINDINGS: Stable cardiomediastinal silhouette. No pneumothorax is noted. Small bilateral pleural effusions are noted. Mild left basilar subsegmental atelectasis or infiltrate may be present. Bony thorax is unremarkable. IMPRESSION: Small bilateral pleural effusions. Mild left basilar subsegmental atelectasis or infiltrate may be present. Aortic Atherosclerosis (ICD10-I70.0). Electronically Signed   By: Marijo Conception M.D.   On: 02/17/2020 10:33   CARDIAC CATHETERIZATION  Result Date: 02/13/2020  Non-stenotic Mid LAD lesion was previously treated.  Prox Cx to Mid Cx lesion is 20% stenosed.  Dist RCA lesion is 20% stenosed.  Non-stenotic Mid RCA lesion was previously treated.  Hemodynamic findings consistent with mild pulmonary hypertension.  LV end diastolic pressure is mildly elevated.  1. Nonobstructive CAD. Continued excellent patency of stents in the LAD and RCA 2. Mildly elevated LV filling pressures 3. Mildly elevated right heart pressures. 4. Normal cardiac output Plan: continue medical therapy. Diuresis. BP control.   US RENAL  Result Date: 02/17/2020 CLINICAL DATA:  Acute on chronic kidney injury EXAM: RENAL / URINARY TRACT ULTRASOUND COMPLETE COMPARISON:  CT abdomen and pelvis 04/16/2019 FINDINGS: Right Kidney: Renal measurements: 10.3 x 4.3 x 4.4 = volume: 103 mL. Normal echogenicity. No hydronephrosis. 2.5 cm simple cyst seen in the mid kidney. Left Kidney: Renal measurements: 9.9 x 5.3 x 4.2 = volume: 117 mL. Normal echogenicity. No hydronephrosis. Prominence of the cortex in the mid left  kidney is not significantly changed compared to prior examination and most likely represents normal anatomic variation given that hilar fat also changes contours in this region. Bladder: Position is limited due to shadowing bowel gas. Other: None. IMPRESSION: No significant abnormality of the kidneys. Electronically Signed   By: Miachel Roux M.D.   On: 02/17/2020 14:00   DG Chest Port 1 View  Result Date: 02/28/2020 CLINICAL DATA:  Shortness of breath. EXAM: PORTABLE CHEST 1 VIEW COMPARISON:  02/24/2020. FINDINGS: Mediastinum and hilar structures normal. Cardiomegaly. No pulmonary venous congestion. Bibasilar atelectasis. Mild bibasilar infiltrates/edema. Tiny bilateral pleural effusions. Findings slightly progressed from prior exam. Biapical pleural thickening consistent scarring. Surgical clips right axilla. Thoracic spine scoliosis concave right. Degenerative change thoracic spine. IMPRESSION: 1. Cardiomegaly. No pulmonary venous congestion. 2. Bibasilar atelectasis. Mild bibasilar infiltrates/edema. Tiny bilateral pleural effusions. Findings slightly progressed from prior exam. Electronically Signed   By: Marcello Moores  Register   On: 02/28/2020 08:35   DG CHEST PORT 1 VIEW  Result Date: 02/24/2020 CLINICAL DATA:  Evaluate for pleural effusion. EXAM: PORTABLE CHEST 1 VIEW COMPARISON:  02/23/2020 FINDINGS: Cardiac pads have been removed from the chest. Persistent densities at the left lung base. Heart size is within normal limits and stable. Right lung is clear. Probable scarring at the lung apices. Atherosclerotic calcifications at the aortic arch. IMPRESSION: Residual densities at the left lung base. Findings could represent consolidation or small effusion. Electronically Signed   By: Markus Daft M.D.   On: 02/24/2020 08:28   DG CHEST PORT 1 VIEW  Result Date: 02/23/2020 CLINICAL DATA:  Worsening shortness of breath over the last 2 days. EXAM: PORTABLE CHEST 1 VIEW COMPARISON:  02/17/2020 FINDINGS: Worsened  congestive heart failure pattern. Cardiomegaly. Aortic atherosclerosis. Pulmonary venous hypertension. Interstitial pulmonary edema. Enlarging bilateral effusions with associated lower lobe atelectasis. IMPRESSION: Worsening congestive heart failure pattern. Enlarging bilateral effusions with associated lower lobe atelectasis. Electronically Signed   By: Nelson Chimes M.D.   On: 02/23/2020 08:39   DG Chest Portable 1 View  Result Date: 02/12/2020 CLINICAL DATA:  Shortness of breath. EXAM: PORTABLE CHEST 1 VIEW COMPARISON:  January 16, 2020. FINDINGS: Mild diffuse interstitial prominence. Small left and possible trace right pleural effusions. Overlying left basilar opacities. No visible pneumothorax. Biapical pleuroparenchymal scarring. Mild enlargement the cardiac silhouette. Coronary artery stent. Aortic atherosclerosis. Clips project over the right axilla. IMPRESSION: 1. Mild diffuse interstitial prominence, suggestive of mild interstitial edema. 2. Small left and possible trace right pleural effusions. Overlying left basilar opacities are favored to reflect atelectasis, although pneumonia or aspiration is not excluded. Electronically Signed   By: Margaretha Sheffield MD   On: 02/12/2020 17:58   VAS US RENAL ARTERY DUPLEX  Result Date: 02/24/2020 ABDOMINAL VISCERAL Indications: Hypertension emergency High Risk Factors: Hypertension, hyperlipidemia, Diabetes, coronary artery                    disease. Limitations: Air/bowel gas. Performing Technologist: Oda Cogan RDMS, RVT  Examination Guidelines: A complete evaluation includes B-mode imaging, spectral Doppler, color Doppler, and power Doppler as needed of all accessible portions of each vessel. Bilateral testing is considered an integral part of a complete examination. Limited examinations for reoccurring indications may be performed as noted.  Duplex Findings: +--------------------+--------+--------+------+--------+ Mesenteric          PSV cm/sEDV  cm/sPlaqueComments +--------------------+--------+--------+------+--------+ Aorta Prox            108                 Stenotic +--------------------+--------+--------+------+--------+ Aorta Distal          262                 stenotic +--------------------+--------+--------+------+--------+ Celiac Artery Origin  239                          +--------------------+--------+--------+------+--------+ SMA Proximal          221                          +--------------------+--------+--------+------+--------+   +------------------+--------+--------+-----------------+ Right Renal ArteryPSV cm/sEDV cm/s     Comment      +------------------+--------+--------+-----------------+ Origin              137      19   poorly visualized +------------------+--------+--------+-----------------+ Proximal            109      22                     +------------------+--------+--------+-----------------+ Mid                 124      17                     +------------------+--------+--------+-----------------+ Distal              103      17                     +------------------+--------+--------+-----------------+ +-----------------+--------+--------+-------+ Left Renal ArteryPSV cm/sEDV cm/sComment +-----------------+--------+--------+-------+ Origin             281      28           +-----------------+--------+--------+-------+ Proximal           157      17           +-----------------+--------+--------+-------+ Mid                 71      22           +-----------------+--------+--------+-------+ Distal              89      15           +-----------------+--------+--------+-------+  Technologist observations: Distal aorta was technically difficult to visualize due to overlying bowel gas. Waveforms appeared stenotic. +------------+--------+--------+----+-----------+--------+--------+----+ Right KidneyPSV cm/sEDV cm/sRI  Left KidneyPSV cm/sEDV cm/sRI    +------------+--------+--------+----+-----------+--------+--------+----+ Upper Pole  29      6       0.79Upper Pole 28  9       0.68 +------------+--------+--------+----+-----------+--------+--------+----+ Mid         21      12      0.43Mid        29      12      0.60 +------------+--------+--------+----+-----------+--------+--------+----+ Lower Pole  21      10      0.54Lower Pole 28      10      0.63 +------------+--------+--------+----+-----------+--------+--------+----+ Hilar       37      7       0.80Hilar      27      9       0.65 +------------+--------+--------+----+-----------+--------+--------+----+ +------------------+-----+------------------+-----+ Right Kidney           Left Kidney             +------------------+-----+------------------+-----+ RAR                    RAR                     +------------------+-----+------------------+-----+ RAR (manual)      1.0  RAR (manual)      2.6   +------------------+-----+------------------+-----+ Cortex                 Cortex                  +------------------+-----+------------------+-----+ Cortex thickness       Corex thickness         +------------------+-----+------------------+-----+ Kidney length (cm)10.55Kidney length (cm)11.05 +------------------+-----+------------------+-----+  Summary: Renal:  Right: 1-59% stenosis of the right renal artery. Normal size right        kidney. Cyst(s) noted. Left:  Evidence 60 percent or less stenosis in the left renal artery        by velocities. Normal size of left kidney.  *See table(s) above for measurements and observations.  Diagnosing physician: Jamelle Haring  Electronically signed by Jamelle Haring on 02/24/2020 at 5:19:57 PM.    Final    ECHOCARDIOGRAM LIMITED  Result Date: 02/17/2020    ECHOCARDIOGRAM LIMITED REPORT   Patient Name:   Crystal [name] Date of Exam: 02/17/2020 Medical Rec #:  [id number]          Height:       65.0 in  Accession #:    [id number]         Weight:       147.5 lb Date of Birth:  [birth year]          BSA:          1.738 m Patient Age:    75 years           BP:           150/57 mmHg Patient Gender: F                  HR:           63 bpm. Exam Location:  Inpatient Procedure: Limited Echo, Limited Color Doppler and Cardiac Doppler Indications:    NSTEMI  History:        Patient has prior history of Echocardiogram examinations, most                 recent 01/17/2020. CAD; Risk Factors:Dyslipidemia and Diabetes.  Sonographer:    Johny Chess Referring Phys: 42 [place]  1. Compared with the echo 09/3233, systolic function  is worse and the pleural effusion is new.  2. Hypokinesis of the basal to mid anteroseptal, inferoseptal and inferior myocardium. Left ventricular ejection fraction, by estimation, is 40 to 45%. The left ventricle has mildly decreased function. The left ventricle demonstrates regional wall motion abnormalities (see scoring diagram/findings for description). Left ventricular diastolic parameters are consistent with Grade II diastolic dysfunction (pseudonormalization). Elevated left ventricular end-diastolic pressure.  3. Moderate pleural effusion in the left lateral region.  4. Mild to moderate mitral valve regurgitation.  5. Tricuspid valve regurgitation is moderate.  6. The aortic valve is tricuspid. Aortic valve regurgitation is mild.  7. There is moderately elevated pulmonary artery systolic pressure.  8. The inferior vena cava is dilated in size with <50% respiratory variability, suggesting right atrial pressure of 15 mmHg. FINDINGS  Left Ventricle: Hypokinesis of the basal to mid anteroseptal, inferoseptal and inferior myocardium. Left ventricular ejection fraction, by estimation, is 40 to 45%. The left ventricle has mildly decreased function. The left ventricle demonstrates regional wall motion abnormalities. Left ventricular diastolic parameters are consistent with Grade II  diastolic dysfunction (pseudonormalization). Elevated left ventricular end-diastolic pressure. Right Ventricle: There is moderately elevated pulmonary artery systolic pressure. The tricuspid regurgitant velocity is 2.86 m/s, and with an assumed right atrial pressure of 15 mmHg, the estimated right ventricular systolic pressure is 09.9 mmHg. Pericardium: Trivial pericardial effusion is present. Mitral Valve: Mild mitral annular calcification. Mild to moderate mitral valve regurgitation. Tricuspid Valve: Tricuspid valve regurgitation is moderate. Aortic Valve: The aortic valve is tricuspid. Aortic valve regurgitation is mild. Pulmonic Valve: Pulmonic valve regurgitation is trivial. Venous: The inferior vena cava is dilated in size with less than 50% respiratory variability, suggesting right atrial pressure of 15 mmHg. Additional Comments: There is a moderate pleural effusion in the left lateral region. LEFT VENTRICLE PLAX 2D LVIDd:         4.60 cm  Diastology LVIDs:         3.50 cm  LV e' medial:    4.35 cm/s LV PW:         1.20 cm  LV E/e' medial:  33.1 LV IVS:        1.00 cm  LV e' lateral:   6.85 cm/s LVOT diam:     1.70 cm  LV E/e' lateral: 21.0 LV SV:         39 LV SV Index:   22 LVOT Area:     2.27 cm  IVC IVC diam: 2.60 cm LEFT ATRIUM         Index LA diam:    3.60 cm 2.07 cm/m  AORTIC VALVE LVOT Vmax:   87.50 cm/s LVOT Vmean:  57.700 cm/s LVOT VTI:    0.170 m  AORTA Ao Root diam: 2.70 cm Ao Asc diam:  2.60 cm MITRAL VALVE                TRICUSPID VALVE MV Area (PHT): 2.20 cm     TR Peak grad:   32.7 mmHg MV Decel Time: 345 msec     TR Vmax:        286.00 cm/s MV E velocity: 144.00 cm/s MV A velocity: 98.30 cm/s   SHUNTS MV E/A ratio:  1.46         Systemic VTI:  0.17 m                             Systemic Diam: 1.70 cm Tiffany  Oval Linsey MD Electronically signed by Skeet Latch MD Signature Date/Time: 02/17/2020/3:43:24 PM    Final    Disposition   Pt is being discharged home today in good  condition.  Follow-up Plans & Appointments     Follow-up Information    [facility] Follow up.   Specialty: [place] Why: A Representative from [facility] will contact you to arrange start date and time for your therapy. Contact information: [address] [address] [phone]        Satira Sark, MD Follow up.   Specialty: Cardiology Why: Office will call you with a follow up appt in the next 2 business days Contact information: [place] [address] [phone]        Daneil Dolin, MD Follow up.   Specialty: Gastroenterology Why: Please follow up with GI MD within 4 weeks Contact information: [address] [place] [address] [phone]              Discharge Instructions    ([place]) Call MD:  Anytime you have any of the following symptoms: 1) 3 pound weight gain in 24 hours or 5 pounds in 1 week 2) shortness of breath, with or without a dry hacking cough 3) swelling in the hands, feet or stomach 4) if you have to sleep on extra pillows at night in order to breathe.   Complete by: As directed    Call MD for:  difficulty breathing, headache or visual disturbances   Complete by: As directed    Call MD for:  persistant dizziness or light-headedness   Complete by: As directed    Diet - low sodium heart healthy   Complete by: As directed    Increase activity slowly   Complete by: As directed    Walker rolling   Complete by: As directed       Discharge Medications   Allergies as of 03/04/2020      Reactions   Bactrim [sulfamethoxazole-trimethoprim] Nausea And Vomiting   Sulfa Antibiotics Nausea And Vomiting   Amlodipine Other (See Comments)   EDEMA   Clonidine Derivatives Other (See Comments)   FATIGUE   Evista [raloxifene Hcl] Other (See Comments)   GERD   Fosamax [alendronate Sodium] Other (See Comments)   INCREASED GERD    Glipizide Other (See  Comments)   PALPITATIONS   Lipitor [atorvastatin Calcium] Other (See Comments)   ACHING   Losartan Other (See Comments)   FATIGUE    Pravastatin Other (See Comments)   ACHING   Azithromycin Rash, Other (See Comments)   FACIAL BURNING       Medication List    STOP taking these medications   aspirin EC 81 MG tablet   Doxepin HCl 3 MG Tabs   furosemide 20 MG tablet Commonly known as: LASIX   Livalo 1 MG Tabs Generic drug: Pitavastatin Calcium     TAKE these medications   acetaminophen 500 MG tablet Commonly known as: TYLENOL Take 500 mg by mouth every 6 (six) hours as needed for headache (pain).   Alogliptin Benzoate 12.5 MG Tabs Take 12.5 mg by mouth daily at 2 PM.   amiodarone 200 MG tablet Commonly known as: PACERONE Take 2 tabs (400mg ) twice a day until 2/13, then reduce to 1 tab (200mg ) daily   amLODipine 5 MG tablet Commonly known as: NORVASC Take 1 tablet (5 mg total) by mouth daily. Start taking on: March 05, 2020   apixaban 2.5 MG Tabs tablet Commonly known as: ELIQUIS Take 1 tablet (2.5 mg total) by mouth 2 (two) times daily.   CALCIUM 500 + D PO Take 1 tablet by mouth daily before lunch. Citracal plus D3   carvedilol 25 MG tablet Commonly known as: COREG Take 1 tablet (25 mg total) by mouth in the morning and at bedtime. Breakfast & supper   cholecalciferol 25 MCG (1000 UNIT) tablet Commonly known as: VITAMIN D3 Take 1,000 Units by mouth daily with breakfast.   CINNAMON PO Take 1,000 mg by mouth daily before lunch.   clopidogrel 75 MG tablet Commonly known as: PLAVIX TAKE 1 TABLET BY MOUTH DAILY--STOP BRILLINTA. What changed: See the new instructions.   diazepam 2 MG tablet Commonly known as: VALIUM Take 2 mg by mouth 2 (two) times daily as needed (dizzy spells.).   ferrous sulfate 325 (65 FE) MG tablet Take 325 mg by mouth daily before lunch.   FISH OIL PO Take 1,576 mg by mouth daily before lunch.   hydrALAZINE 100 MG  tablet Commonly known as: APRESOLINE Take 1 tablet (100 mg total) by mouth every 8 (eight) hours. What changed:   medication strength  how much to take  when to take this   isosorbide mononitrate 120 MG 24 hr tablet Commonly known as: IMDUR Take 1 tablet (120 mg total) by mouth at bedtime. What changed:   medication strength  how much to take  when to take this   loperamide 2 MG capsule Commonly known as: IMODIUM Take 2-4 mg by mouth 4 (four) times daily as needed for diarrhea or loose stools.   meclizine 25 MG tablet Commonly known as: ANTIVERT Take 25 mg by mouth 2 (two) times daily as needed for dizziness.   multivitamin with minerals Tabs tablet Take 1 tablet by mouth daily. Centrum Silver for Women   multivitamin-lutein Caps capsule Take 1 capsule by mouth daily before lunch.   nitroGLYCERIN 0.4 MG SL tablet Commonly known as: NITROSTAT Place 1 tablet (0.4 mg total) under the tongue every 5 (five) minutes x 3 doses as needed for chest pain.   pantoprazole 40 MG tablet Commonly known as: PROTONIX Take 1 tablet (40 mg total) by mouth daily. Take 30 minutes before breakfast   potassium chloride SA 20 MEQ tablet Commonly known as: KLOR-CON Take 2 tablets (40 mEq total) by mouth daily. Start taking on: March 05, 2020   rosuvastatin 5 MG tablet Commonly known as: CRESTOR Take 0.5 tablets (2.5 mg total) by mouth every other day. Start taking on: March 05, 2020   saccharomyces boulardii 250 MG capsule Commonly known as: FLORASTOR Take 250 mg by mouth 2 (two) times daily as needed (diarrhea).   sodium chloride 0.65 % Soln nasal spray Commonly known as: OCEAN Place 1 spray into both nostrils 2 (two) times daily as needed for congestion.   torsemide 20 MG tablet Commonly known as: DEMADEX Take 1 tablet (20 mg total) by mouth daily. Start taking on: March 05, 2020       Outstanding Labs/Studies   BMET/CBC at follow up appt  Duration of Discharge  Encounter   Greater than 30 minutes including physician time.  Signed, Reino Bellis, NP 03/04/2020, 1:00 PM

## 2020-03-05 ENCOUNTER — Telehealth (HOSPITAL_COMMUNITY): Payer: Self-pay

## 2020-03-05 ENCOUNTER — Encounter (HOSPITAL_COMMUNITY): Payer: Medicare Other

## 2020-03-05 NOTE — Telephone Encounter (Signed)
Notified by Ria Comment with [facility] that copay for Eliquis would be high. Contacted pt and [facility] and provided free 30-day copay card billing information to the pharmacy. I gave the pt and the pharmacy instructions on how to obtain the $10 monthly copay card. Pt and pharmacy verbalized understanding and will enroll her in the copay card for future fills.

## 2020-03-06 ENCOUNTER — Other Ambulatory Visit: Payer: Self-pay | Admitting: *Deleted

## 2020-03-06 NOTE — Patient Outreach (Signed)
[facility]) Care Management  03/06/2020  [place] [birth year] [id number]  Telephone outreach for PAC: NSTEMI. She was hospitalized from 02/12/20-03/04/20. Additional hx includes, DM, Hyperlipidemia, CAD, Acute on Chronic HF, AFIB, Pleural Effusion, AKI, Flash pulmonary edema, Hypertensive urgency.  Call was Unsuccessful, left message and requested a return call.  I will try again tomorrow.  [name]. [name], MSN, [facility] Gerontological Nurse Practitioner [facility] Care Management [phone]

## 2020-03-07 ENCOUNTER — Other Ambulatory Visit: Payer: Self-pay

## 2020-03-07 ENCOUNTER — Other Ambulatory Visit: Payer: Self-pay | Admitting: *Deleted

## 2020-03-07 DIAGNOSIS — D509 Iron deficiency anemia, unspecified: Secondary | ICD-10-CM | POA: Diagnosis not present

## 2020-03-07 DIAGNOSIS — E7800 Pure hypercholesterolemia, unspecified: Secondary | ICD-10-CM | POA: Diagnosis not present

## 2020-03-07 DIAGNOSIS — Z9181 History of falling: Secondary | ICD-10-CM | POA: Diagnosis not present

## 2020-03-07 DIAGNOSIS — I480 Paroxysmal atrial fibrillation: Secondary | ICD-10-CM | POA: Diagnosis not present

## 2020-03-07 DIAGNOSIS — I083 Combined rheumatic disorders of mitral, aortic and tricuspid valves: Secondary | ICD-10-CM | POA: Diagnosis not present

## 2020-03-07 DIAGNOSIS — E782 Mixed hyperlipidemia: Secondary | ICD-10-CM | POA: Diagnosis not present

## 2020-03-07 DIAGNOSIS — E1165 Type 2 diabetes mellitus with hyperglycemia: Secondary | ICD-10-CM | POA: Diagnosis not present

## 2020-03-07 DIAGNOSIS — N183 Chronic kidney disease, stage 3 unspecified: Secondary | ICD-10-CM | POA: Diagnosis not present

## 2020-03-07 DIAGNOSIS — E871 Hypo-osmolality and hyponatremia: Secondary | ICD-10-CM | POA: Diagnosis not present

## 2020-03-07 DIAGNOSIS — I160 Hypertensive urgency: Secondary | ICD-10-CM | POA: Diagnosis not present

## 2020-03-07 DIAGNOSIS — I130 Hypertensive heart and chronic kidney disease with heart failure and stage 1 through stage 4 chronic kidney disease, or unspecified chronic kidney disease: Secondary | ICD-10-CM | POA: Diagnosis not present

## 2020-03-07 DIAGNOSIS — I21A1 Myocardial infarction type 2: Secondary | ICD-10-CM | POA: Diagnosis not present

## 2020-03-07 DIAGNOSIS — Z299 Encounter for prophylactic measures, unspecified: Secondary | ICD-10-CM | POA: Diagnosis not present

## 2020-03-07 DIAGNOSIS — I2510 Atherosclerotic heart disease of native coronary artery without angina pectoris: Secondary | ICD-10-CM | POA: Diagnosis not present

## 2020-03-07 DIAGNOSIS — Z853 Personal history of malignant neoplasm of breast: Secondary | ICD-10-CM | POA: Diagnosis not present

## 2020-03-07 DIAGNOSIS — Z7901 Long term (current) use of anticoagulants: Secondary | ICD-10-CM | POA: Diagnosis not present

## 2020-03-07 DIAGNOSIS — I501 Left ventricular failure: Secondary | ICD-10-CM | POA: Diagnosis not present

## 2020-03-07 DIAGNOSIS — Z48812 Encounter for surgical aftercare following surgery on the circulatory system: Secondary | ICD-10-CM | POA: Diagnosis not present

## 2020-03-07 DIAGNOSIS — I25119 Atherosclerotic heart disease of native coronary artery with unspecified angina pectoris: Secondary | ICD-10-CM | POA: Diagnosis not present

## 2020-03-07 DIAGNOSIS — E1122 Type 2 diabetes mellitus with diabetic chronic kidney disease: Secondary | ICD-10-CM | POA: Diagnosis not present

## 2020-03-07 DIAGNOSIS — M8580 Other specified disorders of bone density and structure, unspecified site: Secondary | ICD-10-CM | POA: Diagnosis not present

## 2020-03-07 DIAGNOSIS — I252 Old myocardial infarction: Secondary | ICD-10-CM | POA: Diagnosis not present

## 2020-03-07 DIAGNOSIS — I5043 Acute on chronic combined systolic (congestive) and diastolic (congestive) heart failure: Secondary | ICD-10-CM | POA: Diagnosis not present

## 2020-03-07 DIAGNOSIS — I10 Essential (primary) hypertension: Secondary | ICD-10-CM | POA: Diagnosis not present

## 2020-03-07 DIAGNOSIS — K2100 Gastro-esophageal reflux disease with esophagitis, without bleeding: Secondary | ICD-10-CM | POA: Diagnosis not present

## 2020-03-07 DIAGNOSIS — M1030 Gout due to renal impairment, unspecified site: Secondary | ICD-10-CM | POA: Diagnosis not present

## 2020-03-07 DIAGNOSIS — Z7902 Long term (current) use of antithrombotics/antiplatelets: Secondary | ICD-10-CM | POA: Diagnosis not present

## 2020-03-07 DIAGNOSIS — I4891 Unspecified atrial fibrillation: Secondary | ICD-10-CM | POA: Diagnosis not present

## 2020-03-07 DIAGNOSIS — Z955 Presence of coronary angioplasty implant and graft: Secondary | ICD-10-CM | POA: Diagnosis not present

## 2020-03-07 DIAGNOSIS — N179 Acute kidney failure, unspecified: Secondary | ICD-10-CM | POA: Diagnosis not present

## 2020-03-07 NOTE — Patient Outreach (Signed)
[facility]) Care Management  03/07/2020  Dayanira Giovannetti [place] [birth year] [id number]  Telephone outreach for transition of care call, second attempt was unsuccessful to pt home and mobile. Called listed contact, nephew, Colin Ina, who advised she is staying with he and his sister. He reports she is doing OK since she came home on Monday.   Mrs. Nowland had a NSTEMI with multiple complications. She was in the hospital for 21 days and in and out of ICU.  Her medical hx includes: Resistant HTN, HF, CAD, AFIB, Flash pulmonary edema, pleural effusion, DM, Statin myopathy, Acute on Chronic renal failure, hyperlipidemia, anemia.  Spoke to pt who has had a video visit with Dr. Woody Seller today. No changes to her medical regimen. She will have her cardiology f/u on the 17th.  Patient was recently discharged from hospital and all medications have been reviewed. Outpatient Encounter Medications as of 03/07/2020  Medication Sig  . acetaminophen (TYLENOL) 500 MG tablet Take 500 mg by mouth every 6 (six) hours as needed for headache (pain).  . Alogliptin Benzoate 12.5 MG TABS Take 12.5 mg by mouth daily at 2 PM.   . amiodarone (PACERONE) 200 MG tablet Take 2 tabs (400mg ) twice a day until 2/13, then reduce to 1 tab (200mg ) daily  . amLODipine (NORVASC) 5 MG tablet Take 1 tablet (5 mg total) by mouth daily.  Marland Kitchen apixaban (ELIQUIS) 2.5 MG TABS tablet Take 1 tablet (2.5 mg total) by mouth 2 (two) times daily.  . Calcium Carbonate-Vitamin D (CALCIUM 500 + D PO) Take 1 tablet by mouth daily before lunch. Citracal plus D3  . carvedilol (COREG) 25 MG tablet Take 1 tablet (25 mg total) by mouth in the morning and at bedtime. Breakfast & supper  . cholecalciferol (VITAMIN D3) 25 MCG (1000 UT) tablet Take 1,000 Units by mouth daily with breakfast.  . CINNAMON PO Take 1,000 mg by mouth daily before lunch.   . clopidogrel (PLAVIX) 75 MG tablet TAKE 1 TABLET BY MOUTH DAILY--STOP BRILLINTA. (Patient taking  differently: Take 75 mg by mouth every morning.)  . diazepam (VALIUM) 2 MG tablet Take 2 mg by mouth 2 (two) times daily as needed (dizzy spells.).  Marland Kitchen ferrous sulfate 325 (65 FE) MG tablet Take 325 mg by mouth daily before lunch.  . hydrALAZINE (APRESOLINE) 100 MG tablet Take 1 tablet (100 mg total) by mouth every 8 (eight) hours.  . isosorbide mononitrate (IMDUR) 120 MG 24 hr tablet Take 1 tablet (120 mg total) by mouth at bedtime.  Marland Kitchen loperamide (IMODIUM) 2 MG capsule Take 2-4 mg by mouth 4 (four) times daily as needed for diarrhea or loose stools.  . meclizine (ANTIVERT) 25 MG tablet Take 25 mg by mouth 2 (two) times daily as needed for dizziness.  . Multiple Vitamin (MULTIVITAMIN WITH MINERALS) TABS tablet Take 1 tablet by mouth daily. Centrum Silver for Women  . multivitamin-lutein (OCUVITE-LUTEIN) CAPS capsule Take 1 capsule by mouth daily before lunch.   . nitroGLYCERIN (NITROSTAT) 0.4 MG SL tablet Place 1 tablet (0.4 mg total) under the tongue every 5 (five) minutes x 3 doses as needed for chest pain.  . Omega-3 Fatty Acids (FISH OIL PO) Take 1,576 mg by mouth daily before lunch.  . pantoprazole (PROTONIX) 40 MG tablet Take 1 tablet (40 mg total) by mouth daily. Take 30 minutes before breakfast  . potassium chloride SA (KLOR-CON) 20 MEQ tablet Take 2 tablets (40 mEq total) by mouth daily.  . rosuvastatin (CRESTOR) 5 MG  tablet Take 0.5 tablets (2.5 mg total) by mouth every other day.  Marland Kitchen saccharomyces boulardii (FLORASTOR) 250 MG capsule Take 250 mg by mouth 2 (two) times daily as needed (diarrhea).   . sodium chloride (OCEAN) 0.65 % SOLN nasal spray Place 1 spray into both nostrils 2 (two) times daily as needed for congestion.   . torsemide (DEMADEX) 20 MG tablet Take 1 tablet (20 mg total) by mouth daily.   No facility-administered encounter medications on file as of 03/07/2020.   Goals Addressed              This Visit's Progress     Patient Stated   .  [facility]) Patient Stated  (pt-stated)   On track     Pt will call MD or NP if has increasing sxs HF (wt. Gain, wheezing, SOB, Edema) to overt an ED visit over the next 3 months.  Start date 10/23/20 High Priority Long term goal Expected end date: 06/26/20  03/07/20 Pt has followed her goal but had to go to the ED and be hospitalized for subsequent MI. Reinforced HF and CAD Action plan to weigh daily, take meds, activity to tolerance, go to appts and report any heart sxs early to MD to prevent complications and subsequent trips to the hospital.    .  [facility]) Track and Manage Fluids and Swelling-Heart Failure (pt-stated)   On track     Timeframe:  Short-Term Goal Priority:  Medium Start Date:                   01/24/20          Expected End Date:      03/26/20                 Follow Up Date 04/05/20   - call office if I gain more than 2 pounds in one day or 5 pounds in one week - do ankle pumps when sitting - keep legs up while sitting - use salt in moderation - watch for swelling in feet, ankles and legs every day - weigh myself daily    Why is this important?    It is important to check your weight daily and watch how much salt and liquids you have.   It will help you to manage your heart failure.    Notes: Today we discussed how to judge her edema level. Explained a little bit of swelling in feet or ankle would be 1+, more swelling extending up into her calf would be 2+, Swelling that is beginning to be significant and leaves a depression when skin is pressed would be 3+ and swelling that pulls the skin tight and shiny, may be weeping and leaves a depression when finger pushed in is 4+. Advise MD if edema is 3+ and more so. Wear support stockings. Elevate legs as much as possible to let gravitiy assist fluid shift and loss. 03/07/20 Home after 2nd MI in a month. Reinforced following prior instructions to self manage her HF.      We agreed to talk weekly over the next month.  [name]. [name], MSN,  [facility] Gerontological Nurse Practitioner [facility] Care Management [phone]

## 2020-03-12 ENCOUNTER — Telehealth: Payer: Self-pay | Admitting: Family Medicine

## 2020-03-12 NOTE — Telephone Encounter (Signed)
Would this be appropriate since this will be hospital follow up?  Or should we move out a bit till she can physically come in?    See upcoming appointments below :  03/15/20 - post hosp with Katina Dung, NP  06/26/20 - 6 mo f/u with Dr. Domenic Polite

## 2020-03-12 NOTE — Telephone Encounter (Signed)
Pt wants to know if it will be ok for her to have her hospital f/u as a VV on Thursday. She doesn't have anyone to bring her in.

## 2020-03-12 NOTE — Telephone Encounter (Signed)
No answer

## 2020-03-12 NOTE — Telephone Encounter (Signed)
Yes, keep the scheduled visit with Crystal [name] on Thursday, it can be a virtual encounter.

## 2020-03-12 NOTE — Telephone Encounter (Signed)
  Patient Consent for Virtual Visit         Crystal [name] has provided verbal consent on 03/12/2020 for a virtual visit (video or telephone).   CONSENT FOR VIRTUAL VISIT FOR:  Crystal [name]  By participating in this virtual visit I agree to the following:  I hereby voluntarily request, consent and authorize [facility] and its employed or contracted physicians, Engineer, materials, nurse practitioners or other licensed health care professionals (the Practitioner), to provide me with telemedicine health care services (the "Services") as deemed necessary by the treating Practitioner. I acknowledge and consent to receive the Services by the Practitioner via telemedicine. I understand that the telemedicine visit will involve communicating with the Practitioner through live audiovisual communication technology and the disclosure of certain medical information by electronic transmission. I acknowledge that I have been given the opportunity to request an in-person assessment or other available alternative prior to the telemedicine visit and am voluntarily participating in the telemedicine visit.  I understand that I have the right to withhold or withdraw my consent to the use of telemedicine in the course of my care at any time, without affecting my right to future care or treatment, and that the Practitioner or I may terminate the telemedicine visit at any time. I understand that I have the right to inspect all information obtained and/or recorded in the course of the telemedicine visit and may receive copies of available information for a reasonable fee.  I understand that some of the potential risks of receiving the Services via telemedicine include:  Marland Kitchen Delay or interruption in medical evaluation due to technological equipment failure or disruption; . Information transmitted may not be sufficient (e.g. poor resolution of images) to allow for appropriate medical decision making by the  Practitioner; and/or  . In rare instances, security protocols could fail, causing a breach of personal health information.  Furthermore, I acknowledge that it is my responsibility to provide information about my medical history, conditions and care that is complete and accurate to the best of my ability. I acknowledge that Practitioner's advice, recommendations, and/or decision may be based on factors not within their control, such as incomplete or inaccurate data provided by me or distortions of diagnostic images or specimens that may result from electronic transmissions. I understand that the practice of medicine is not an exact science and that Practitioner makes no warranties or guarantees regarding treatment outcomes. I acknowledge that a copy of this consent can be made available to me via my patient portal ([place]), or I can request a printed copy by calling the office of [facility].    I understand that my insurance will be billed for this visit.   I have read or had this consent read to me. . I understand the contents of this consent, which adequately explains the benefits and risks of the Services being provided via telemedicine.  . I have been provided ample opportunity to ask questions regarding this consent and the Services and have had my questions answered to my satisfaction. . I give my informed consent for the services to be provided through the use of telemedicine in my medical care

## 2020-03-13 DIAGNOSIS — N183 Chronic kidney disease, stage 3 unspecified: Secondary | ICD-10-CM | POA: Diagnosis not present

## 2020-03-13 DIAGNOSIS — I5043 Acute on chronic combined systolic (congestive) and diastolic (congestive) heart failure: Secondary | ICD-10-CM | POA: Diagnosis not present

## 2020-03-13 DIAGNOSIS — I160 Hypertensive urgency: Secondary | ICD-10-CM | POA: Diagnosis not present

## 2020-03-13 DIAGNOSIS — I501 Left ventricular failure: Secondary | ICD-10-CM | POA: Diagnosis not present

## 2020-03-13 DIAGNOSIS — E1122 Type 2 diabetes mellitus with diabetic chronic kidney disease: Secondary | ICD-10-CM | POA: Diagnosis not present

## 2020-03-13 DIAGNOSIS — I130 Hypertensive heart and chronic kidney disease with heart failure and stage 1 through stage 4 chronic kidney disease, or unspecified chronic kidney disease: Secondary | ICD-10-CM | POA: Diagnosis not present

## 2020-03-13 NOTE — Telephone Encounter (Signed)
No answer x2 

## 2020-03-14 ENCOUNTER — Ambulatory Visit: Payer: Medicare Other | Admitting: Gastroenterology

## 2020-03-14 ENCOUNTER — Other Ambulatory Visit: Payer: Self-pay | Admitting: *Deleted

## 2020-03-14 NOTE — Progress Notes (Signed)
Virtual Visit via Telephone Note   This visit type was conducted due to national recommendations for restrictions regarding the COVID-19 Pandemic (e.g. social distancing) in an effort to limit this patient's exposure and mitigate transmission in our community.  Due to her co-morbid illnesses, this patient is at least at moderate risk for complications without adequate follow up.  This format is felt to be most appropriate for this patient at this time.  The patient did not have access to video technology/had technical difficulties with video requiring transitioning to audio format only (telephone).  All issues noted in this document were discussed and addressed.  No physical exam could be performed with this format.  Please refer to the patient's chart for her  consent to telehealth for [facility].    Date:  03/15/2020   ID:  Crystal [name], DOB May 06, 1934, MRN [id number] The patient was identified using 2 identifiers.  Patient Location: Home Provider Location: Office/Clinic   PCP:  Glenda Chroman, MD   [place]  Cardiologist:  Rozann Lesches, MD  Advanced Practice Provider:  No care team member to display Electrophysiologist:  None     Evaluation Performed:  Follow-Up Visit  Chief Complaint: Cardiac follow-up  History of Present Illness:    Crystal [name] is a 86 y.o. female with history of CAD, ischemic cardiomyopathy, congestive heart failure, HLD, HTN, atrial fibrillation   Admitted for NSTEMI in December 2021. Prior STEMI January 2021 with DES to mid LAD. History of chronic systolic heart failure with prior EF 30 to 35%. Most recent EF of 45 to 50%. Subsequently had totally occluded RCA with DES. Previous LAD stent was widely patent. Her spironolactone and ARB were stopped due to worsening renal function.  Was seen on 01/26/2020 status post recent NSTEMI. She denied any anginal or exertional symptoms, palpitations or arrhythmias,  orthostatic symptoms, CVA or TIA-like symptoms.  Recent presentation 02/12/2020 with non-ST elevation MI. She presented with gradual onset of shortness of breath/exertional dyspnea over the prior 2 weeks with associated jaw pain, occasional palpitations with significantly elevated blood pressure. Cardiac catheterization showed patency of previously placed stents with mildly elevated LV filling pressures and elevated right heart pressures. EF 40 to 45%. Treated with Lasix.   Spoke to the patient by phone today via telemedicine. She states she is doing fairly well and is working with PT since hospital discharge. She is currently living with her nephew and his wife.  States she is feeling better and is eating better. States he is having some trouble sleeping. She takes Valium and melatonin which seems not to be helping to a great degree. No anginal or exertional symptoms. She is maintaining her weight around 130. Her weight at discharge from recent hospital stay was 131 pounds. She is taking all of her medications as directed.  Denies DOE/shortness of breath, sensation of palpitations or arrhythmias, orthostatic symptoms, CVA or TIA-like symptoms.  No lower extremity edema.  The patient does not have symptoms concerning for COVID-19 infection (fever, chills, cough, or new shortness of breath).    Past Medical History:  Diagnosis Date  . Bell's palsy   . Breast cancer ([place]) 1998   Right mastectomy  . CAD (coronary artery disease)    a. s/p STEMI in 01/2019 with DES to mid-LAD  . CHF (congestive heart failure) ([place])    a. EF 30-35% by echo in 01/2019  . Essential hypertension   . GERD (gastroesophageal reflux disease)   .  Gout   . History of skin cancer    Squamous cell, left shoulder  . Mixed hyperlipidemia   . Osteopenia   . Thyroid nodule   . Type 2 diabetes mellitus ([place])    Past Surgical History:  Procedure Laterality Date  . BIOPSY  10/13/2019   Procedure: BIOPSY;  Surgeon: Daneil Dolin, MD;  [facility];  Service: Endoscopy;;  . CATARACT EXTRACTION  2016  . CORONARY ANGIOGRAPHY N/A 01/16/2020   Procedure: CORONARY ANGIOGRAPHY;  Surgeon: Jettie Booze, MD;  Location: [place] [facility] LAB;  Service: Cardiovascular;  Laterality: N/A;  . CORONARY STENT INTERVENTION N/A 01/16/2020   Procedure: CORONARY STENT INTERVENTION;  Surgeon: Jettie Booze, MD;  Location: [place] [facility] LAB;  Service: Cardiovascular;  Laterality: N/A;  . CORONARY/GRAFT ACUTE MI REVASCULARIZATION N/A 01/30/2019   Procedure: Coronary/Graft Acute MI Revascularization;  Surgeon: Burnell Blanks, MD;  Location: [place] [facility] LAB;  Service: Cardiovascular;  Laterality: N/A;  . ESOPHAGOGASTRODUODENOSCOPY (EGD) WITH PROPOFOL N/A 10/13/2019   Non-obstructing Schatzki ring at GE junction, s/p dilation, erosive gastropathy with stigmata of recent bleeding, normal duodenum. Negative H.pylori.   Marland Kitchen HYSTEROSCOPY    . INTRAVASCULAR ULTRASOUND/IVUS N/A 01/16/2020   Procedure: Intravascular Ultrasound/IVUS;  Surgeon: Jettie Booze, MD;  Location: [place] [facility] LAB;  Service: Cardiovascular;  Laterality: N/A;  . LEFT HEART CATH AND CORONARY ANGIOGRAPHY N/A 01/30/2019   Procedure: LEFT HEART CATH AND CORONARY ANGIOGRAPHY;  Surgeon: Burnell Blanks, MD;  Location: [place] [facility] LAB;  Service: Cardiovascular;  Laterality: N/A;  . LEFT HEART CATH AND CORONARY ANGIOGRAPHY N/A 01/16/2020   Procedure: LEFT HEART CATH AND CORONARY ANGIOGRAPHY;  Surgeon: Jettie Booze, MD;  Location: [place] [facility];  Service: Cardiovascular;  Laterality: N/A;  . Venia Minks DILATION N/A 10/13/2019   Procedure: Venia Minks DILATION;  Surgeon: Daneil Dolin, MD;  [facility];  Service: Endoscopy;  Laterality: N/A;  . Right mastectomy  1998   [place]  . RIGHT/LEFT HEART CATH AND CORONARY ANGIOGRAPHY N/A 02/13/2020   Procedure: RIGHT/LEFT HEART CATH AND CORONARY ANGIOGRAPHY;  Surgeon:  Martinique, Peter M, MD;  Location: [place] [facility];  Service: Cardiovascular;  Laterality: N/A;     Current Meds  Medication Sig  . acetaminophen (TYLENOL) 500 MG tablet Take 500 mg by mouth every 6 (six) hours as needed for headache (pain).  . Alogliptin Benzoate 12.5 MG TABS Take 12.5 mg by mouth daily at 2 PM.   . amiodarone (PACERONE) 200 MG tablet Take 200 mg by mouth daily.  Marland Kitchen amLODipine (NORVASC) 5 MG tablet Take 1 tablet (5 mg total) by mouth daily.  Marland Kitchen apixaban (ELIQUIS) 2.5 MG TABS tablet Take 1 tablet (2.5 mg total) by mouth 2 (two) times daily.  . Calcium Carbonate-Vitamin D (CALCIUM 500 + D PO) Take 1 tablet by mouth daily before lunch. Citracal plus D3  . carvedilol (COREG) 25 MG tablet Take 1 tablet (25 mg total) by mouth in the morning and at bedtime. Breakfast & supper  . cholecalciferol (VITAMIN D3) 25 MCG (1000 UT) tablet Take 1,000 Units by mouth daily with breakfast.  . CINNAMON PO Take 1,000 mg by mouth daily before lunch.   . clopidogrel (PLAVIX) 75 MG tablet TAKE 1 TABLET BY MOUTH DAILY--STOP BRILLINTA. (Patient taking differently: Take 75 mg by mouth every morning.)  . diazepam (VALIUM) 2 MG tablet Take 2 mg by mouth 2 (two) times daily as needed (dizzy spells.).  Marland Kitchen  ferrous sulfate 325 (65 FE) MG tablet Take 325 mg by mouth daily before lunch.  . hydrALAZINE (APRESOLINE) 100 MG tablet Take 1 tablet (100 mg total) by mouth every 8 (eight) hours.  . isosorbide mononitrate (IMDUR) 120 MG 24 hr tablet Take 1 tablet (120 mg total) by mouth at bedtime.  Marland Kitchen loperamide (IMODIUM) 2 MG capsule Take 2-4 mg by mouth 4 (four) times daily as needed for diarrhea or loose stools.  . meclizine (ANTIVERT) 25 MG tablet Take 25 mg by mouth 2 (two) times daily as needed for dizziness.  . Multiple Vitamin (MULTIVITAMIN WITH MINERALS) TABS tablet Take 1 tablet by mouth daily. Centrum Silver for Women  . multivitamin-lutein (OCUVITE-LUTEIN) CAPS capsule Take 1 capsule by mouth daily before  lunch.   . nitroGLYCERIN (NITROSTAT) 0.4 MG SL tablet Place 1 tablet (0.4 mg total) under the tongue every 5 (five) minutes x 3 doses as needed for chest pain.  . Omega-3 Fatty Acids (FISH OIL PO) Take 1,576 mg by mouth daily before lunch.  . pantoprazole (PROTONIX) 40 MG tablet Take 1 tablet (40 mg total) by mouth daily. Take 30 minutes before breakfast  . potassium chloride SA (KLOR-CON) 20 MEQ tablet Take 2 tablets (40 mEq total) by mouth daily.  . rosuvastatin (CRESTOR) 5 MG tablet Take 0.5 tablets (2.5 mg total) by mouth every other day.  Marland Kitchen saccharomyces boulardii (FLORASTOR) 250 MG capsule Take 250 mg by mouth 2 (two) times daily as needed (diarrhea).   . sodium chloride (OCEAN) 0.65 % SOLN nasal spray Place 1 spray into both nostrils 2 (two) times daily as needed for congestion.   . torsemide (DEMADEX) 20 MG tablet Take 1 tablet (20 mg total) by mouth daily.     Allergies:   Bactrim [sulfamethoxazole-trimethoprim], Sulfa antibiotics, Amlodipine, Clonidine derivatives, Evista [raloxifene hcl], Fosamax [alendronate sodium], Glipizide, Lipitor [atorvastatin calcium], Losartan, Pravastatin, and Azithromycin   Social History   Tobacco Use  . Smoking status: Never Smoker  . Smokeless tobacco: Never Used  Vaping Use  . Vaping Use: Never used  Substance Use Topics  . Alcohol use: Never  . Drug use: Never     Family Hx: The patient's family history includes Aneurysm in her sister; Cancer in her sister; Heart attack in her father, maternal uncle, maternal uncle, maternal uncle, paternal aunt, paternal aunt, paternal grandfather, and sister; Heart disease in her maternal uncle, maternal uncle, maternal uncle, paternal aunt, paternal aunt, paternal grandfather, and sister; Stroke in her mother. There is no history of Colon cancer or Colon polyps.  ROS:   Please see the history of present illness.   All other systems reviewed and are negative.   Prior CV studies:   The following studies  were reviewed today:    Cath: 02/13/20    Non-stenotic Mid LAD lesion was previously treated.  Prox Cx to Mid Cx lesion is 20% stenosed.  Dist RCA lesion is 20% stenosed.  Non-stenotic Mid RCA lesion was previously treated.  Hemodynamic findings consistent with mild pulmonary hypertension.  LV end diastolic pressure is mildly elevated.  1. Nonobstructive CAD. Continued excellent patency of stents in the LAD and RCA 2. Mildly elevated LV filling pressures 3. Mildly elevated right heart pressures. 4. Normal cardiac output  Plan: continue medical therapy. Diuresis. BP control.  Diagnostic Dominance: Right   Echo: 02/17/20  IMPRESSIONS    1. Compared with the echo 37/6283, systolic function is worse and the  pleural effusion is new.  2. Hypokinesis of the  basal to mid anteroseptal, inferoseptal and  inferior myocardium. Left ventricular ejection fraction, by estimation, is  40 to 45%. The left ventricle has mildly decreased function. The left  ventricle demonstrates regional wall  motion abnormalities (see scoring diagram/findings for description). Left  ventricular diastolic parameters are consistent with Grade II diastolic  dysfunction (pseudonormalization). Elevated left ventricular end-diastolic  pressure.  3. Moderate pleural effusion in the left lateral region.  4. Mild to moderate mitral valve regurgitation.  5. Tricuspid valve regurgitation is moderate.  6. The aortic valve is tricuspid. Aortic valve regurgitation is mild.  7. There is moderately elevated pulmonary artery systolic pressure.  8. The inferior vena cava is dilated in size with <50% respiratory  variability, suggesting right atrial pressure of 15 mmHg.     Cath: 01/16/20   Previously placed Mid LAD drug eluting stent is widely patent.  Prox Cx to Mid Cx lesion is 20% stenosed.  Mid RCA lesion is 99% stenosed.  A drug-eluting stent was successfully placed using a STENT  RESOLUTE ONYX 3.5X38, postdilated to > 4 mm and optimized with IVUS. Transient no reflow when the stent was deployed, which resolved with IC verapamil.  Post intervention, there is a 0% residual stenosis.  Dist RCA lesion is 20% stenosed.  LV end diastolic pressure is low.  There is no aortic valve stenosis.  Continue aggressive secondary prevention. DAPT for 12 months.   Watch overnight.  Diagnostic Dominance: Right    Intervention     Echo: 01/17/20  IMPRESSIONS    1. Left ventricular ejection fraction, by estimation, is 50 to 55%. The  left ventricle has low normal function. The left ventricle has no regional  wall motion abnormalities. There is mild left ventricular hypertrophy.  Left ventricular diastolic  parameters are consistent with Grade I diastolic dysfunction (impaired  relaxation). Elevated left atrial pressure.  2. Right ventricular systolic function is normal. The right ventricular  size is normal.  3. Left atrial size was mildly dilated.  4. The mitral valve is normal in structure. Mild mitral valve  regurgitation. No evidence of mitral stenosis.  5. The aortic valve has an indeterminant number of cusps. Aortic valve  regurgitation is mild to moderate. No aortic stenosis is present.  6. The inferior vena cava is normal in size with greater than 50%  respiratory variability, suggesting right atrial pressure of 3 mmHg.     Cardiac catheterization 01/30/2019:  Prox RCA lesion is 60% stenosed.  Mid RCA lesion is 40% stenosed.  Prox Cx to Mid Cx lesion is 20% stenosed.  Mid LAD lesion is 99% stenosed.  A drug-eluting stent was successfully placed using a SYNERGY XD 3.0X28.  Post intervention, there is a 0% residual stenosis.  1. Severe stenosis mid LAD 2. Successful PTCA/DES x 1 mid LAD 3. Mild non-obstructive disease in the Circumflex 4. Moderate non-obstructive disease in the mid RCA  Recommendations: Will admit to the  ICU. DAPT with ASA and Brilinta for one year. Continue home beta blocker. Echo in am. She is statin intolerant. Fast track discharge, possibly home tomorrow if stable.  Echocardiogram 06/13/2019: 1. Left ventricular ejection fraction, by estimation, is 45 to 50%. The  left ventricle has mildly decreased function. The left ventricle  demonstrates regional wall motion abnormalities (see scoring  diagram/findings for description). Left ventricular  diastolic parameters are consistent with Grade I diastolic dysfunction  (impaired relaxation). Elevated left ventricular end-diastolic pressure.  2. Right ventricular systolic function is mildly reduced. The right  ventricular size is normal. There is normal pulmonary artery systolic  pressure.  3. Left atrial size was mildly dilated.  4. The mitral valve is degenerative. Mild mitral valve regurgitation.  5. Tricuspid valve regurgitation is mild to moderate.  6. The aortic valve was not well visualized. Aortic valve regurgitation  is mild. No aortic stenosis is present.  7. The inferior vena cava is normal in size with greater than 50%  respiratory variability, suggesting right atrial pressure of 3 mmHg.   Comparison(s): Echocardiogram done 01/31/19 showed an EF of 30-35%.     Labs/Other Tests and Data Reviewed:    EKG: No EKG due to virtual visit  Recent Labs: 02/14/2020: TSH 2.238 02/23/2020: B Natriuretic Peptide 3,507.5 02/29/2020: ALT 31; Hemoglobin 9.2; Platelets 261 03/02/2020: Magnesium 1.7 03/04/2020: BUN 42; Creatinine, Ser 2.02; Potassium 4.0; Sodium 135   Recent Lipid Panel Lab Results  Component Value Date/Time   CHOL 155 02/14/2020 03:39 AM   TRIG 74 02/14/2020 03:39 AM   HDL 57 02/14/2020 03:39 AM   CHOLHDL 2.7 02/14/2020 03:39 AM   LDLCALC 83 02/14/2020 03:39 AM    Wt Readings from Last 3 Encounters:  03/15/20 130 lb (59 kg)  03/04/20 131 lb 11.2 oz (59.7 kg)  01/26/20 138 lb 3.2 oz (62.7 kg)     Risk  Assessment/Calculations:    CHA2DS2-VASc Score = 6  This indicates a 9.7% annual risk of stroke. The patient's score is based upon: CHF History: Yes HTN History: Yes Diabetes History: Yes Stroke History: No Vascular Disease History: No Age Score: 2 Gender Score: 1     Objective:    Vital Signs:  BP 124/60 Comment: done on 2/15  Pulse 65   Ht 5\' 5"  (1.651 m)   Wt 130 lb (59 kg)   SpO2 97%   BMI 21.63 kg/m    VITAL SIGNS:  reviewed   Patient responding appropriately to questions.  Normal speech pattern.  No signs of dyspnea, cough or wheezing noted during phone conversation.  ASSESSMENT & PLAN:    1. NSTEMI (non-ST elevated myocardial infarction) ([place]) Recent NSTEMI with admission on 02/12/2020.  Cardiac catheterization 02/13/2020 nonobstructive CAD, continued excellent patency of stents in LAD and RCA.  Mildly elevated LV pressures and right heart pressures.  Normal cardiac output.  Proximal circumflex to mid circumflex 20%, distal RCA 20%.  Denies any anginal or exertional symptoms today during phone conversation.  Continue Plavix 75 mg daily.  Continue Imdur 120 mg daily.  Continue nitroglycerin 0.4 mg sublingual as needed.  Continue Coreg 25 mg p.o. twice daily  2. Chronic combined systolic (congestive) and diastolic (congestive) heart failure (HCC) Recent admission with NSTEMI and acute on chronic combined systolic diastolic heart failure.  Treated with IV Lasix which was held in setting of transient worsening renal function.  Nephrology was consulted.  Creatinine improved and she was placed back on oral diuretics.  Transitioned from Lasix to torsemide which was more effective.  She was net -7.2 L with a discharge weight of 131 pounds.  She weighs 130 pounds today.  She denies any exertional dyspnea or shortness of breath today.  Continue Coreg 25 mg p.o. twice daily.  Continue hydralazine 100 mg 3 times daily.  Continue Imdur 120 mg daily.  Continue torsemide 20 mg p.o. daily.   Continue fluid restrictions to 60 ounces per day, weigh daily and call if weight gain of 3 pounds in 24 hours or 5 pounds in a week.  Continue sodium restrictions.  Please get a follow-up echocardiogram in 3 months prior to follow-up   3. CAD in native artery Recent cardiac catheterization on 02/13/2020 demonstrated patent stents of LAD and RCA.  Proximal to mid circumflex 20%.  Distal RCA 20%.  Currently denies any anginal symptoms.  Continue Imdur 120 mg daily.  Continue nitroglycerin sublingual as needed.  Continue Coreg 25 mg p.o. twice daily  4. Paroxysmal atrial fibrillation (HCC) Heart rate today is 65.  Continue amiodarone 200 mg p.o. daily.  Continue Eliquis 2.5 mg p.o. twice daily. CHA2DS2-VASc score of 6  5. Essential hypertension Blood pressure recently significantly elevated during admission for NSTEMI.  Blood pressure today is well controlled at 124/60.  Continue hydralazine 100 mg 3 times daily.  Amlodipine 5 mg p.o. daily.  6. CKD Recent creatinine on 03/04/2020 was 2.02.,  GFR 24.  Patient has a pending appointment with Dr. Theador Hawthorne nephrology.   COVID-19 Education: The signs and symptoms of COVID-19 were discussed with the patient and how to seek care for testing (follow up with PCP or arrange E-visit).  The importance of social distancing was discussed today.  Time:   Today, I have spent 15  minutes with the patient with telehealth technology discussing the above problems.     Medication Adjustments/Labs and Tests Ordered: Current medicines are reviewed at length with the patient today.  Concerns regarding medicines are outlined above.   Tests Ordered: Orders Placed This Encounter  Procedures  . ECHOCARDIOGRAM COMPLETE    Medication Changes: No orders of the defined types were placed in this encounter.   Follow Up:  In Person in 3 month(s)  Signed, Verta Ellen, NP  03/15/2020 1:33 PM    [facility]

## 2020-03-14 NOTE — Patient Outreach (Signed)
[facility]) Care Management  03/14/2020  Crystal [name] 10/13/1934 [id number]  Transition of care call.    Pt has PT yesterday. VS are within normal. She is doing her incentive spirometry. Using walker to get around. Niece will assist with bath today. Family prepares her meds and she takes them routinely. Will have a virtual cardiology appt tomorrow.   Weight 131, stable.. Breathing is improving. No edema in the am. Does have some edema at night but does have some as day goes on. She elevates her legs when seated during the day.  Goals Addressed              This Visit's Progress     Patient Stated   .  [facility]) Patient Stated (pt-stated)        Pt will call MD or NP if has increasing sxs HF (wt. Gain, wheezing, SOB, Edema) to overt an ED visit over the next 3 months.  Start date 10/23/20, renewed on 03/07/20. High Priority Long term goal Expected end date: 06/26/20  03/07/20 Pt has followed her goal but had to go to the ED and be hospitalized for subsequent MI. Reinforced HF and CAD Action plan to weigh daily, take meds, activity to tolerance, go to appts and report any heart sxs early to MD to prevent complications and subsequent trips to the hospital. 03/14/20 Following HF Action plan. Wt is stable, no signs of HF. Pt acknowledges she knows when to call for advice. She will have video visit with cardiology tomorrow She feels she is making progress, striving to get her independence back. She feels she is less SOB and it is evident in talking with her, she is less SOB when talking. Encouraged her to give all her effort in her therapy sessions to progress and to be safe.    .  COMPLETED: ([facility]N) Track and Manage Fluids and Swelling-Heart Failure (pt-stated)        Timeframe:  Short-Term Goal Priority:  Medium Start Date:                   01/24/20          Expected End Date:      03/26/20                 Follow Up Date 04/05/20   - call office if I gain more than 2  pounds in one day or 5 pounds in one week - do ankle pumps when sitting - keep legs up while sitting - use salt in moderation - watch for swelling in feet, ankles and legs every day - weigh myself daily    Why is this important?    It is important to check your weight daily and watch how much salt and liquids you have.   It will help you to manage your heart failure.    Notes: Today we discussed how to judge her edema level. Explained a little bit of swelling in feet or ankle would be 1+, more swelling extending up into her calf would be 2+, Swelling that is beginning to be significant and leaves a depression when skin is pressed would be 3+ and swelling that pulls the skin tight and shiny, may be weeping and leaves a depression when finger pushed in is 4+. Advise MD if edema is 3+ and more so. Wear support stockings. Elevate legs as much as possible to let gravitiy assist fluid shift and loss. 03/07/20 Home after 2nd MI  in a month. Reinforced following prior instructions to self manage her HF. 03/14/20 all components of this goal contained in previous goal will close this goal.      We agreed to talk again in one week.  Crystal [name]. Crystal Neither, MSN, [facility] Gerontological Nurse Practitioner [facility] Care Management [phone]

## 2020-03-15 ENCOUNTER — Encounter: Payer: Self-pay | Admitting: Family Medicine

## 2020-03-15 ENCOUNTER — Telehealth (INDEPENDENT_AMBULATORY_CARE_PROVIDER_SITE_OTHER): Payer: Medicare Other | Admitting: Family Medicine

## 2020-03-15 VITALS — BP 124/60 | HR 65 | Ht 65.0 in | Wt 130.0 lb

## 2020-03-15 DIAGNOSIS — I480 Paroxysmal atrial fibrillation: Secondary | ICD-10-CM

## 2020-03-15 DIAGNOSIS — I5042 Chronic combined systolic (congestive) and diastolic (congestive) heart failure: Secondary | ICD-10-CM | POA: Diagnosis not present

## 2020-03-15 DIAGNOSIS — R931 Abnormal findings on diagnostic imaging of heart and coronary circulation: Secondary | ICD-10-CM

## 2020-03-15 DIAGNOSIS — I2510 Atherosclerotic heart disease of native coronary artery without angina pectoris: Secondary | ICD-10-CM

## 2020-03-15 DIAGNOSIS — I214 Non-ST elevation (NSTEMI) myocardial infarction: Secondary | ICD-10-CM

## 2020-03-15 DIAGNOSIS — I10 Essential (primary) hypertension: Secondary | ICD-10-CM | POA: Diagnosis not present

## 2020-03-15 NOTE — Patient Instructions (Signed)
Medication Instructions:  Continue all current medications.  Labwork: none  Testing/Procedures: Your physician has requested that you have an echocardiogram. Echocardiography is a painless test that uses sound waves to create images of your heart. It provides your doctor with information about the size and shape of your heart and how well your heart's chambers and valves are working. This procedure takes approximately one hour. There are no restrictions for this procedure - DUE JUST PRIOR TO NEXT OFFICE VISIT   Follow-Up: 3 months   Any Other Special Instructions Will Be Listed Below (If Applicable).  If you need a refill on your cardiac medications before your next appointment, please call your pharmacy.

## 2020-03-15 NOTE — Telephone Encounter (Signed)
VV completed this morning.

## 2020-03-16 DIAGNOSIS — I160 Hypertensive urgency: Secondary | ICD-10-CM | POA: Diagnosis not present

## 2020-03-16 DIAGNOSIS — I130 Hypertensive heart and chronic kidney disease with heart failure and stage 1 through stage 4 chronic kidney disease, or unspecified chronic kidney disease: Secondary | ICD-10-CM | POA: Diagnosis not present

## 2020-03-16 DIAGNOSIS — I501 Left ventricular failure: Secondary | ICD-10-CM | POA: Diagnosis not present

## 2020-03-16 DIAGNOSIS — E1122 Type 2 diabetes mellitus with diabetic chronic kidney disease: Secondary | ICD-10-CM | POA: Diagnosis not present

## 2020-03-16 DIAGNOSIS — I5043 Acute on chronic combined systolic (congestive) and diastolic (congestive) heart failure: Secondary | ICD-10-CM | POA: Diagnosis not present

## 2020-03-16 DIAGNOSIS — N183 Chronic kidney disease, stage 3 unspecified: Secondary | ICD-10-CM | POA: Diagnosis not present

## 2020-03-19 DIAGNOSIS — I160 Hypertensive urgency: Secondary | ICD-10-CM | POA: Diagnosis not present

## 2020-03-19 DIAGNOSIS — I501 Left ventricular failure: Secondary | ICD-10-CM | POA: Diagnosis not present

## 2020-03-19 DIAGNOSIS — I5043 Acute on chronic combined systolic (congestive) and diastolic (congestive) heart failure: Secondary | ICD-10-CM | POA: Diagnosis not present

## 2020-03-19 DIAGNOSIS — I130 Hypertensive heart and chronic kidney disease with heart failure and stage 1 through stage 4 chronic kidney disease, or unspecified chronic kidney disease: Secondary | ICD-10-CM | POA: Diagnosis not present

## 2020-03-20 DIAGNOSIS — I501 Left ventricular failure: Secondary | ICD-10-CM | POA: Diagnosis not present

## 2020-03-20 DIAGNOSIS — I130 Hypertensive heart and chronic kidney disease with heart failure and stage 1 through stage 4 chronic kidney disease, or unspecified chronic kidney disease: Secondary | ICD-10-CM | POA: Diagnosis not present

## 2020-03-20 DIAGNOSIS — E1122 Type 2 diabetes mellitus with diabetic chronic kidney disease: Secondary | ICD-10-CM | POA: Diagnosis not present

## 2020-03-20 DIAGNOSIS — I5043 Acute on chronic combined systolic (congestive) and diastolic (congestive) heart failure: Secondary | ICD-10-CM | POA: Diagnosis not present

## 2020-03-20 DIAGNOSIS — I160 Hypertensive urgency: Secondary | ICD-10-CM | POA: Diagnosis not present

## 2020-03-20 DIAGNOSIS — N183 Chronic kidney disease, stage 3 unspecified: Secondary | ICD-10-CM | POA: Diagnosis not present

## 2020-03-21 ENCOUNTER — Other Ambulatory Visit: Payer: Self-pay | Admitting: *Deleted

## 2020-03-21 NOTE — Patient Outreach (Signed)
[facility]) Care Management  03/21/2020  Crystal [name] [birth year] [id number]  Telephone outreach to F/U on NSTEMI recovery.  Goals Addressed              This Visit's Progress     Patient Stated   .  [facility]) Patient Stated (pt-stated)        Pt will call MD or NP if has increasing sxs HF (wt. Gain, wheezing, SOB, Edema) to overt an ED visit over the next 3 months.  Start date 10/23/20, renewed on 03/07/20. High Priority Long term goal Expected end date: 06/26/20  03/07/20 Pt has followed her goal but had to go to the ED and be hospitalized for subsequent MI. Reinforced HF and CAD Action plan to weigh daily, take meds, activity to tolerance, go to appts and report any heart sxs early to MD to prevent complications and subsequent trips to the hospital. 03/14/20 Following HF Action plan. Wt is stable, no signs of HF. Pt acknowledges she knows when to call for advice. She will have video visit with cardiology tomorrow She feels she is making progress, striving to get her independence back. She feels she is less SOB and it is evident in talking with her, she is less SOB when talking. Encouraged her to give all her effort in her therapy sessions to progress and to be safe. 03/21/20 No acute probems, progressing nicely. Encouraged continued efforts, attending appts, following medical recommendations. Celebrated that she is happy with her progress and that she IS getting better.      We will talk again in one week.  Crystal Pont. [name], MSN, [facility] Gerontological Nurse Practitioner [facility] Care Management [phone]

## 2020-03-22 DIAGNOSIS — I2510 Atherosclerotic heart disease of native coronary artery without angina pectoris: Secondary | ICD-10-CM | POA: Diagnosis not present

## 2020-03-22 DIAGNOSIS — N189 Chronic kidney disease, unspecified: Secondary | ICD-10-CM | POA: Diagnosis not present

## 2020-03-22 DIAGNOSIS — D638 Anemia in other chronic diseases classified elsewhere: Secondary | ICD-10-CM | POA: Diagnosis not present

## 2020-03-22 DIAGNOSIS — E871 Hypo-osmolality and hyponatremia: Secondary | ICD-10-CM | POA: Diagnosis not present

## 2020-03-22 DIAGNOSIS — Z79899 Other long term (current) drug therapy: Secondary | ICD-10-CM | POA: Diagnosis not present

## 2020-03-22 DIAGNOSIS — E1122 Type 2 diabetes mellitus with diabetic chronic kidney disease: Secondary | ICD-10-CM | POA: Diagnosis not present

## 2020-03-22 DIAGNOSIS — I129 Hypertensive chronic kidney disease with stage 1 through stage 4 chronic kidney disease, or unspecified chronic kidney disease: Secondary | ICD-10-CM | POA: Diagnosis not present

## 2020-03-22 DIAGNOSIS — I5032 Chronic diastolic (congestive) heart failure: Secondary | ICD-10-CM | POA: Diagnosis not present

## 2020-03-23 DIAGNOSIS — I5043 Acute on chronic combined systolic (congestive) and diastolic (congestive) heart failure: Secondary | ICD-10-CM | POA: Diagnosis not present

## 2020-03-23 DIAGNOSIS — N183 Chronic kidney disease, stage 3 unspecified: Secondary | ICD-10-CM | POA: Diagnosis not present

## 2020-03-23 DIAGNOSIS — I130 Hypertensive heart and chronic kidney disease with heart failure and stage 1 through stage 4 chronic kidney disease, or unspecified chronic kidney disease: Secondary | ICD-10-CM | POA: Diagnosis not present

## 2020-03-23 DIAGNOSIS — I501 Left ventricular failure: Secondary | ICD-10-CM | POA: Diagnosis not present

## 2020-03-23 DIAGNOSIS — E1122 Type 2 diabetes mellitus with diabetic chronic kidney disease: Secondary | ICD-10-CM | POA: Diagnosis not present

## 2020-03-23 DIAGNOSIS — I160 Hypertensive urgency: Secondary | ICD-10-CM | POA: Diagnosis not present

## 2020-03-27 DIAGNOSIS — I5043 Acute on chronic combined systolic (congestive) and diastolic (congestive) heart failure: Secondary | ICD-10-CM | POA: Diagnosis not present

## 2020-03-27 DIAGNOSIS — I501 Left ventricular failure: Secondary | ICD-10-CM | POA: Diagnosis not present

## 2020-03-27 DIAGNOSIS — E1122 Type 2 diabetes mellitus with diabetic chronic kidney disease: Secondary | ICD-10-CM | POA: Diagnosis not present

## 2020-03-27 DIAGNOSIS — N183 Chronic kidney disease, stage 3 unspecified: Secondary | ICD-10-CM | POA: Diagnosis not present

## 2020-03-27 DIAGNOSIS — I160 Hypertensive urgency: Secondary | ICD-10-CM | POA: Diagnosis not present

## 2020-03-27 DIAGNOSIS — I130 Hypertensive heart and chronic kidney disease with heart failure and stage 1 through stage 4 chronic kidney disease, or unspecified chronic kidney disease: Secondary | ICD-10-CM | POA: Diagnosis not present

## 2020-03-28 ENCOUNTER — Other Ambulatory Visit: Payer: Self-pay | Admitting: *Deleted

## 2020-03-28 ENCOUNTER — Other Ambulatory Visit: Payer: Self-pay

## 2020-03-28 NOTE — Patient Outreach (Signed)
[facility]) Care Management  [facility] Care Manager  03/28/2020   Crystal [name] December 17, 1934 [id number]  Subjective: Telephone outreach to follow up on HF, CAD and CKD.  Saw Dr. Theador Hawthorne for her first nephrology visit. He reviewed her medical hx and they made plans for next steps. She will have a number of labs done and discuss results in one month.  Encounter Medications:  Outpatient Encounter Medications as of 03/28/2020  Medication Sig  . acetaminophen (TYLENOL) 500 MG tablet Take 500 mg by mouth every 6 (six) hours as needed for headache (pain).  . Alogliptin Benzoate 12.5 MG TABS Take 12.5 mg by mouth daily at 2 PM.   . amiodarone (PACERONE) 200 MG tablet Take 200 mg by mouth daily.  Marland Kitchen amLODipine (NORVASC) 5 MG tablet Take 1 tablet (5 mg total) by mouth daily.  Marland Kitchen apixaban (ELIQUIS) 2.5 MG TABS tablet Take 1 tablet (2.5 mg total) by mouth 2 (two) times daily.  . Calcium Carbonate-Vitamin D (CALCIUM 500 + D PO) Take 1 tablet by mouth daily before lunch. Citracal plus D3  . carvedilol (COREG) 25 MG tablet Take 1 tablet (25 mg total) by mouth in the morning and at bedtime. Breakfast & supper  . cholecalciferol (VITAMIN D3) 25 MCG (1000 UT) tablet Take 1,000 Units by mouth daily with breakfast.  . CINNAMON PO Take 1,000 mg by mouth daily before lunch.   . clopidogrel (PLAVIX) 75 MG tablet TAKE 1 TABLET BY MOUTH DAILY--STOP BRILLINTA. (Patient taking differently: Take 75 mg by mouth every morning.)  . diazepam (VALIUM) 2 MG tablet Take 2 mg by mouth 2 (two) times daily as needed (dizzy spells.).  Marland Kitchen ferrous sulfate 325 (65 FE) MG tablet Take 325 mg by mouth daily before lunch.  . hydrALAZINE (APRESOLINE) 100 MG tablet Take 1 tablet (100 mg total) by mouth every 8 (eight) hours.  . isosorbide mononitrate (IMDUR) 120 MG 24 hr tablet Take 1 tablet (120 mg total) by mouth at bedtime.  Marland Kitchen loperamide (IMODIUM) 2 MG capsule Take 2-4 mg by mouth 4 (four) times daily as needed for  diarrhea or loose stools.  . meclizine (ANTIVERT) 25 MG tablet Take 25 mg by mouth 2 (two) times daily as needed for dizziness.  . Multiple Vitamin (MULTIVITAMIN WITH MINERALS) TABS tablet Take 1 tablet by mouth daily. Centrum Silver for Women  . multivitamin-lutein (OCUVITE-LUTEIN) CAPS capsule Take 1 capsule by mouth daily before lunch.   . nitroGLYCERIN (NITROSTAT) 0.4 MG SL tablet Place 1 tablet (0.4 mg total) under the tongue every 5 (five) minutes x 3 doses as needed for chest pain.  . Omega-3 Fatty Acids (FISH OIL PO) Take 1,576 mg by mouth daily before lunch.  . pantoprazole (PROTONIX) 40 MG tablet Take 1 tablet (40 mg total) by mouth daily. Take 30 minutes before breakfast  . potassium chloride SA (KLOR-CON) 20 MEQ tablet Take 2 tablets (40 mEq total) by mouth daily.  . rosuvastatin (CRESTOR) 5 MG tablet Take 0.5 tablets (2.5 mg total) by mouth every other day.  Marland Kitchen saccharomyces boulardii (FLORASTOR) 250 MG capsule Take 250 mg by mouth 2 (two) times daily as needed (diarrhea).   . sodium chloride (OCEAN) 0.65 % SOLN nasal spray Place 1 spray into both nostrils 2 (two) times daily as needed for congestion.   . torsemide (DEMADEX) 20 MG tablet Take 1 tablet (20 mg total) by mouth daily.   No facility-administered encounter medications on file as of 03/28/2020.    Assessment: HF,  CAD stablized                        Deconditioned - improving  Goals Addressed              This Visit's Progress     Patient Stated   .  [facility]) Patient Stated (pt-stated)        Pt will call MD or NP if has increasing sxs HF (wt. Gain, wheezing, SOB, Edema) to overt an ED visit over the next 3 months.  Start date 10/23/20, renewed on 03/07/20. High Priority Long term goal Expected end date: 06/26/20  03/07/20 Pt has followed her goal but had to go to the ED and be hospitalized for subsequent MI. Reinforced HF and CAD Action plan to weigh daily, take meds, activity to tolerance, go to appts and report any  heart sxs early to MD to prevent complications and subsequent trips to the hospital. 03/14/20 Following HF Action plan. Wt is stable, no signs of HF. Pt acknowledges she knows when to call for advice. She will have video visit with cardiology tomorrow She feels she is making progress, striving to get her independence back. She feels she is less SOB and it is evident in talking with her, she is less SOB when talking. Encouraged her to give all her effort in her therapy sessions to progress and to be safe. 03/21/20 No acute probems, progressing nicely. Encouraged continued efforts, attending appts, following medical recommendations. Celebrated that she is happy with her progress and that she IS getting better. 03/28/20 Pt reports continueous improvements in her phycial recovery. No HF exacerbating sxs reported. Knows when to call for help. Reinforced Action Plan.       Plan: We agreed to talk again in one week. Follow-up:  Patient agrees to Care Plan and Follow-up.  Crystal [name]. [name], MSN, [facility] Gerontological Nurse Practitioner [facility] Care Management [phone]

## 2020-03-29 DIAGNOSIS — I501 Left ventricular failure: Secondary | ICD-10-CM | POA: Diagnosis not present

## 2020-03-29 DIAGNOSIS — I160 Hypertensive urgency: Secondary | ICD-10-CM | POA: Diagnosis not present

## 2020-03-29 DIAGNOSIS — E1122 Type 2 diabetes mellitus with diabetic chronic kidney disease: Secondary | ICD-10-CM | POA: Diagnosis not present

## 2020-03-29 DIAGNOSIS — I5043 Acute on chronic combined systolic (congestive) and diastolic (congestive) heart failure: Secondary | ICD-10-CM | POA: Diagnosis not present

## 2020-03-29 DIAGNOSIS — I130 Hypertensive heart and chronic kidney disease with heart failure and stage 1 through stage 4 chronic kidney disease, or unspecified chronic kidney disease: Secondary | ICD-10-CM | POA: Diagnosis not present

## 2020-03-29 DIAGNOSIS — N183 Chronic kidney disease, stage 3 unspecified: Secondary | ICD-10-CM | POA: Diagnosis not present

## 2020-04-03 DIAGNOSIS — I5043 Acute on chronic combined systolic (congestive) and diastolic (congestive) heart failure: Secondary | ICD-10-CM | POA: Diagnosis not present

## 2020-04-03 DIAGNOSIS — N183 Chronic kidney disease, stage 3 unspecified: Secondary | ICD-10-CM | POA: Diagnosis not present

## 2020-04-03 DIAGNOSIS — I130 Hypertensive heart and chronic kidney disease with heart failure and stage 1 through stage 4 chronic kidney disease, or unspecified chronic kidney disease: Secondary | ICD-10-CM | POA: Diagnosis not present

## 2020-04-03 DIAGNOSIS — E1122 Type 2 diabetes mellitus with diabetic chronic kidney disease: Secondary | ICD-10-CM | POA: Diagnosis not present

## 2020-04-03 DIAGNOSIS — I160 Hypertensive urgency: Secondary | ICD-10-CM | POA: Diagnosis not present

## 2020-04-03 DIAGNOSIS — I501 Left ventricular failure: Secondary | ICD-10-CM | POA: Diagnosis not present

## 2020-04-04 ENCOUNTER — Other Ambulatory Visit: Payer: Self-pay | Admitting: *Deleted

## 2020-04-04 ENCOUNTER — Other Ambulatory Visit: Payer: Self-pay

## 2020-04-04 NOTE — Patient Outreach (Addendum)
[facility]) Care Management  04/04/2020  Crystal [name] 08/20/1934 [id number]  Telephone outreach for follow up HF and mobility progress.  PT is going well. She is able to walk with a walker. Can stand in the bathroom and get washed up.  Still staying with her nephew so she has good supervision and assistance.      Discussed care gaps not documented in the Epic EMR:  Vaccines: Flu, PNA, Td, COVID Booster  Ask Dr. Woody Seller for date of last DEXA, PNA vac, Shingles, Tdap. Flu for documentation Epic.  Goals Addressed              This Visit's Progress     Patient Stated   .  [facility]) Patient Stated (pt-stated)        Pt will call MD or NP if has increasing sxs HF (wt. Gain, wheezing, SOB, Edema) to overt an ED visit over the next 3 months.  Start date 10/23/20, renewed on 03/07/20. High Priority Long term goal Expected end date: 06/26/20  03/07/20 Pt has followed her goal but had to go to the ED and be hospitalized for subsequent MI. Reinforced HF and CAD Action plan to weigh daily, take meds, activity to tolerance, go to appts and report any heart sxs early to MD to prevent complications and subsequent trips to the hospital. 03/14/20 Following HF Action plan. Wt is stable, no signs of HF. Pt acknowledges she knows when to call for advice. She will have video visit with cardiology tomorrow She feels she is making progress, striving to get her independence back. She feels she is less SOB and it is evident in talking with her, she is less SOB when talking. Encouraged her to give all her effort in her therapy sessions to progress and to be safe. 03/21/20 No acute probems, progressing nicely. Encouraged continued efforts, attending appts, following medical recommendations. Celebrated that she is happy with her progress and that she IS getting better. 03/28/20 Pt reports continueous improvements in her phycial recovery. No HF exacerbating sxs reported. Knows when to call for help.  Reinforced Action Plan. 04/04/20 Wt stable between 127-129#, no SOB and minimal edema. General overall health improvement. Praised for her continued self managed and encouraged to continue this life long regimen.      Other   .  Comorbidities Identified and Managed and documented with pt self management reported each month over the next 3 months.        Pt goal started: 04/04/20 Long term goal High priority Follow up 05/11/20 Expected completion; 07/26/20  Evidence-based guidance:   Assess and address signs/symptoms of comorbidity, including dyslipidemia, diabetes, coronary artery disease, kidney dysfunction.  Monitor labs and identify any gaps in medical tx.  Follow self management        Notes: 04/04/20 Other comorbidities to monitor: DM, CKD, CAD. Chart review revealed Diabetes: Lipids were normal in January! HgbA1C was 6.0 in December! Does not check her glucose at home very often as this is well controlled. Reports she had a foot exam last fall, will have her eye exam: tomorrow.  CKD: labs ordered by nephrology, will get labs tomorrow and pick up jug for 24 hour urine collection. Next appt is last week of Marchl for follow up and discussion regarding lab results. No new CAD sxs and follow up with cardiology scheduled for May.       Assessment: Improving general well-being, Well managed post NSTEMI, HF. Improving mobility and safety. CKD seeing nephrology  and undergoing thorough work-up.  Plan: we agreed to talk again in one month.  Crystal [name]. Crystal Neither, MSN, [facility] Gerontological Nurse Practitioner [facility] Care Management [phone]

## 2020-04-06 DIAGNOSIS — I480 Paroxysmal atrial fibrillation: Secondary | ICD-10-CM | POA: Diagnosis not present

## 2020-04-06 DIAGNOSIS — I21A1 Myocardial infarction type 2: Secondary | ICD-10-CM | POA: Diagnosis not present

## 2020-04-06 DIAGNOSIS — E782 Mixed hyperlipidemia: Secondary | ICD-10-CM | POA: Diagnosis not present

## 2020-04-06 DIAGNOSIS — E7800 Pure hypercholesterolemia, unspecified: Secondary | ICD-10-CM | POA: Diagnosis not present

## 2020-04-06 DIAGNOSIS — I501 Left ventricular failure: Secondary | ICD-10-CM | POA: Diagnosis not present

## 2020-04-06 DIAGNOSIS — Z9181 History of falling: Secondary | ICD-10-CM | POA: Diagnosis not present

## 2020-04-06 DIAGNOSIS — M8580 Other specified disorders of bone density and structure, unspecified site: Secondary | ICD-10-CM | POA: Diagnosis not present

## 2020-04-06 DIAGNOSIS — I083 Combined rheumatic disorders of mitral, aortic and tricuspid valves: Secondary | ICD-10-CM | POA: Diagnosis not present

## 2020-04-06 DIAGNOSIS — M1030 Gout due to renal impairment, unspecified site: Secondary | ICD-10-CM | POA: Diagnosis not present

## 2020-04-06 DIAGNOSIS — K2100 Gastro-esophageal reflux disease with esophagitis, without bleeding: Secondary | ICD-10-CM | POA: Diagnosis not present

## 2020-04-06 DIAGNOSIS — I252 Old myocardial infarction: Secondary | ICD-10-CM | POA: Diagnosis not present

## 2020-04-06 DIAGNOSIS — I2510 Atherosclerotic heart disease of native coronary artery without angina pectoris: Secondary | ICD-10-CM | POA: Diagnosis not present

## 2020-04-06 DIAGNOSIS — E871 Hypo-osmolality and hyponatremia: Secondary | ICD-10-CM | POA: Diagnosis not present

## 2020-04-06 DIAGNOSIS — I5043 Acute on chronic combined systolic (congestive) and diastolic (congestive) heart failure: Secondary | ICD-10-CM | POA: Diagnosis not present

## 2020-04-06 DIAGNOSIS — Z7902 Long term (current) use of antithrombotics/antiplatelets: Secondary | ICD-10-CM | POA: Diagnosis not present

## 2020-04-06 DIAGNOSIS — Z853 Personal history of malignant neoplasm of breast: Secondary | ICD-10-CM | POA: Diagnosis not present

## 2020-04-06 DIAGNOSIS — Z7901 Long term (current) use of anticoagulants: Secondary | ICD-10-CM | POA: Diagnosis not present

## 2020-04-06 DIAGNOSIS — I160 Hypertensive urgency: Secondary | ICD-10-CM | POA: Diagnosis not present

## 2020-04-06 DIAGNOSIS — I130 Hypertensive heart and chronic kidney disease with heart failure and stage 1 through stage 4 chronic kidney disease, or unspecified chronic kidney disease: Secondary | ICD-10-CM | POA: Diagnosis not present

## 2020-04-06 DIAGNOSIS — N183 Chronic kidney disease, stage 3 unspecified: Secondary | ICD-10-CM | POA: Diagnosis not present

## 2020-04-06 DIAGNOSIS — E1122 Type 2 diabetes mellitus with diabetic chronic kidney disease: Secondary | ICD-10-CM | POA: Diagnosis not present

## 2020-04-06 DIAGNOSIS — Z48812 Encounter for surgical aftercare following surgery on the circulatory system: Secondary | ICD-10-CM | POA: Diagnosis not present

## 2020-04-06 DIAGNOSIS — Z955 Presence of coronary angioplasty implant and graft: Secondary | ICD-10-CM | POA: Diagnosis not present

## 2020-04-06 DIAGNOSIS — N179 Acute kidney failure, unspecified: Secondary | ICD-10-CM | POA: Diagnosis not present

## 2020-04-06 DIAGNOSIS — D509 Iron deficiency anemia, unspecified: Secondary | ICD-10-CM | POA: Diagnosis not present

## 2020-04-09 DIAGNOSIS — I2510 Atherosclerotic heart disease of native coronary artery without angina pectoris: Secondary | ICD-10-CM | POA: Diagnosis not present

## 2020-04-09 DIAGNOSIS — Z79899 Other long term (current) drug therapy: Secondary | ICD-10-CM | POA: Diagnosis not present

## 2020-04-09 DIAGNOSIS — I129 Hypertensive chronic kidney disease with stage 1 through stage 4 chronic kidney disease, or unspecified chronic kidney disease: Secondary | ICD-10-CM | POA: Diagnosis not present

## 2020-04-09 DIAGNOSIS — E559 Vitamin D deficiency, unspecified: Secondary | ICD-10-CM | POA: Diagnosis not present

## 2020-04-09 DIAGNOSIS — E1122 Type 2 diabetes mellitus with diabetic chronic kidney disease: Secondary | ICD-10-CM | POA: Diagnosis not present

## 2020-04-09 DIAGNOSIS — I5032 Chronic diastolic (congestive) heart failure: Secondary | ICD-10-CM | POA: Diagnosis not present

## 2020-04-09 DIAGNOSIS — D638 Anemia in other chronic diseases classified elsewhere: Secondary | ICD-10-CM | POA: Diagnosis not present

## 2020-04-09 DIAGNOSIS — E871 Hypo-osmolality and hyponatremia: Secondary | ICD-10-CM | POA: Diagnosis not present

## 2020-04-09 DIAGNOSIS — N189 Chronic kidney disease, unspecified: Secondary | ICD-10-CM | POA: Diagnosis not present

## 2020-04-11 DIAGNOSIS — I501 Left ventricular failure: Secondary | ICD-10-CM | POA: Diagnosis not present

## 2020-04-11 DIAGNOSIS — N183 Chronic kidney disease, stage 3 unspecified: Secondary | ICD-10-CM | POA: Diagnosis not present

## 2020-04-11 DIAGNOSIS — E1122 Type 2 diabetes mellitus with diabetic chronic kidney disease: Secondary | ICD-10-CM | POA: Diagnosis not present

## 2020-04-11 DIAGNOSIS — I5043 Acute on chronic combined systolic (congestive) and diastolic (congestive) heart failure: Secondary | ICD-10-CM | POA: Diagnosis not present

## 2020-04-11 DIAGNOSIS — I130 Hypertensive heart and chronic kidney disease with heart failure and stage 1 through stage 4 chronic kidney disease, or unspecified chronic kidney disease: Secondary | ICD-10-CM | POA: Diagnosis not present

## 2020-04-11 DIAGNOSIS — I160 Hypertensive urgency: Secondary | ICD-10-CM | POA: Diagnosis not present

## 2020-04-18 DIAGNOSIS — C50919 Malignant neoplasm of unspecified site of unspecified female breast: Secondary | ICD-10-CM | POA: Diagnosis not present

## 2020-04-18 DIAGNOSIS — I160 Hypertensive urgency: Secondary | ICD-10-CM | POA: Diagnosis not present

## 2020-04-18 DIAGNOSIS — I5043 Acute on chronic combined systolic (congestive) and diastolic (congestive) heart failure: Secondary | ICD-10-CM | POA: Diagnosis not present

## 2020-04-18 DIAGNOSIS — E1122 Type 2 diabetes mellitus with diabetic chronic kidney disease: Secondary | ICD-10-CM | POA: Diagnosis not present

## 2020-04-18 DIAGNOSIS — E1165 Type 2 diabetes mellitus with hyperglycemia: Secondary | ICD-10-CM | POA: Diagnosis not present

## 2020-04-18 DIAGNOSIS — I130 Hypertensive heart and chronic kidney disease with heart failure and stage 1 through stage 4 chronic kidney disease, or unspecified chronic kidney disease: Secondary | ICD-10-CM | POA: Diagnosis not present

## 2020-04-18 DIAGNOSIS — Z299 Encounter for prophylactic measures, unspecified: Secondary | ICD-10-CM | POA: Diagnosis not present

## 2020-04-18 DIAGNOSIS — I5022 Chronic systolic (congestive) heart failure: Secondary | ICD-10-CM | POA: Diagnosis not present

## 2020-04-18 DIAGNOSIS — N183 Chronic kidney disease, stage 3 unspecified: Secondary | ICD-10-CM | POA: Diagnosis not present

## 2020-04-18 DIAGNOSIS — I10 Essential (primary) hypertension: Secondary | ICD-10-CM | POA: Diagnosis not present

## 2020-04-18 DIAGNOSIS — I501 Left ventricular failure: Secondary | ICD-10-CM | POA: Diagnosis not present

## 2020-04-20 DIAGNOSIS — R768 Other specified abnormal immunological findings in serum: Secondary | ICD-10-CM | POA: Diagnosis not present

## 2020-04-20 DIAGNOSIS — E1122 Type 2 diabetes mellitus with diabetic chronic kidney disease: Secondary | ICD-10-CM | POA: Diagnosis not present

## 2020-04-20 DIAGNOSIS — D638 Anemia in other chronic diseases classified elsewhere: Secondary | ICD-10-CM | POA: Diagnosis not present

## 2020-04-20 DIAGNOSIS — N189 Chronic kidney disease, unspecified: Secondary | ICD-10-CM | POA: Diagnosis not present

## 2020-04-20 DIAGNOSIS — D472 Monoclonal gammopathy: Secondary | ICD-10-CM | POA: Diagnosis not present

## 2020-04-20 DIAGNOSIS — I5032 Chronic diastolic (congestive) heart failure: Secondary | ICD-10-CM | POA: Diagnosis not present

## 2020-04-20 DIAGNOSIS — I129 Hypertensive chronic kidney disease with stage 1 through stage 4 chronic kidney disease, or unspecified chronic kidney disease: Secondary | ICD-10-CM | POA: Diagnosis not present

## 2020-04-24 DIAGNOSIS — I160 Hypertensive urgency: Secondary | ICD-10-CM | POA: Diagnosis not present

## 2020-04-24 DIAGNOSIS — I501 Left ventricular failure: Secondary | ICD-10-CM | POA: Diagnosis not present

## 2020-04-24 DIAGNOSIS — N183 Chronic kidney disease, stage 3 unspecified: Secondary | ICD-10-CM | POA: Diagnosis not present

## 2020-04-24 DIAGNOSIS — I5043 Acute on chronic combined systolic (congestive) and diastolic (congestive) heart failure: Secondary | ICD-10-CM | POA: Diagnosis not present

## 2020-04-24 DIAGNOSIS — I130 Hypertensive heart and chronic kidney disease with heart failure and stage 1 through stage 4 chronic kidney disease, or unspecified chronic kidney disease: Secondary | ICD-10-CM | POA: Diagnosis not present

## 2020-04-24 DIAGNOSIS — E1122 Type 2 diabetes mellitus with diabetic chronic kidney disease: Secondary | ICD-10-CM | POA: Diagnosis not present

## 2020-04-25 ENCOUNTER — Other Ambulatory Visit: Payer: Self-pay | Admitting: Cardiology

## 2020-04-25 ENCOUNTER — Encounter (HOSPITAL_COMMUNITY)
Admission: RE | Admit: 2020-04-25 | Discharge: 2020-04-25 | Disposition: A | Discharge status: Home / Self Care | Payer: Medicare Other | Source: Ambulatory Visit | Attending: Nephrology | Admitting: Nephrology

## 2020-04-25 ENCOUNTER — Encounter (HOSPITAL_COMMUNITY): Payer: Self-pay

## 2020-04-25 ENCOUNTER — Other Ambulatory Visit: Payer: Self-pay

## 2020-04-25 DIAGNOSIS — N184 Chronic kidney disease, stage 4 (severe): Secondary | ICD-10-CM | POA: Insufficient documentation

## 2020-04-25 DIAGNOSIS — D631 Anemia in chronic kidney disease: Secondary | ICD-10-CM | POA: Insufficient documentation

## 2020-04-25 HISTORY — DX: Chronic kidney disease, unspecified: N18.9

## 2020-04-25 HISTORY — DX: Anemia, unspecified: D64.9

## 2020-04-25 MED ORDER — SODIUM CHLORIDE 0.9 % IV SOLN
510.0000 mg | Freq: Once | INTRAVENOUS | Status: DC
  Administered 2020-04-25: 510 mg via INTRAVENOUS
  Filled 2020-04-25: qty 510

## 2020-04-25 MED ORDER — SODIUM CHLORIDE 0.9 % IV SOLN: Freq: Once | INTRAVENOUS | Status: AC

## 2020-04-25 MED ORDER — EPOETIN ALFA-EPBX 3000 UNIT/ML IJ SOLN
3000.0000 [IU] | Freq: Once | INTRAMUSCULAR | Status: AC
  Administered 2020-04-25: 3000 [IU] via SUBCUTANEOUS
  Filled 2020-04-25: qty 1

## 2020-04-26 NOTE — Telephone Encounter (Signed)
This is a [place] pt °

## 2020-05-01 ENCOUNTER — Encounter (HOSPITAL_COMMUNITY): Payer: Self-pay | Admitting: Hematology

## 2020-05-01 ENCOUNTER — Other Ambulatory Visit: Payer: Self-pay

## 2020-05-01 ENCOUNTER — Inpatient Hospital Stay (HOSPITAL_COMMUNITY): Payer: Medicare Other | Attending: Hematology | Admitting: Hematology

## 2020-05-01 VITALS — BP 154/39 | HR 77 | Temp 96.8°F | Resp 18 | Wt 129.2 lb

## 2020-05-01 DIAGNOSIS — I509 Heart failure, unspecified: Secondary | ICD-10-CM | POA: Diagnosis not present

## 2020-05-01 DIAGNOSIS — K219 Gastro-esophageal reflux disease without esophagitis: Secondary | ICD-10-CM | POA: Diagnosis not present

## 2020-05-01 DIAGNOSIS — I4891 Unspecified atrial fibrillation: Secondary | ICD-10-CM | POA: Insufficient documentation

## 2020-05-01 DIAGNOSIS — I129 Hypertensive chronic kidney disease with stage 1 through stage 4 chronic kidney disease, or unspecified chronic kidney disease: Secondary | ICD-10-CM | POA: Insufficient documentation

## 2020-05-01 DIAGNOSIS — E118 Type 2 diabetes mellitus with unspecified complications: Secondary | ICD-10-CM | POA: Diagnosis not present

## 2020-05-01 DIAGNOSIS — Z79899 Other long term (current) drug therapy: Secondary | ICD-10-CM | POA: Diagnosis not present

## 2020-05-01 DIAGNOSIS — D472 Monoclonal gammopathy: Secondary | ICD-10-CM | POA: Diagnosis not present

## 2020-05-01 DIAGNOSIS — N184 Chronic kidney disease, stage 4 (severe): Secondary | ICD-10-CM | POA: Insufficient documentation

## 2020-05-01 DIAGNOSIS — I252 Old myocardial infarction: Secondary | ICD-10-CM | POA: Diagnosis not present

## 2020-05-01 NOTE — Progress Notes (Signed)
Crystal [name], [place] [address]   CLINIC:  Medical Oncology/Hematology  CONSULT NOTE  Patient Care Team: Glenda Chroman, MD as PCP - General (Internal Medicine) Satira Sark, MD as PCP - Cardiology (Cardiology) Deloria Lair, NP as [place] Management Oletta Lamas, Garwin Brothers, RN as Oncology Nurse Navigator (Oncology)  CHIEF COMPLAINTS/PURPOSE OF CONSULTATION:  Referral for MGUS  HISTORY OF PRESENTING ILLNESS:  Crystal [name] 86 y.o. female is here at the request of Dr. Theador Hawthorne (nephrology) due to possible MGUS or other plasma cell dyscrasia, as well as multifactorial anemia.  Patient was hospitalized in January 2022 for NSTEMI and CHF exacerbation, found during that time to have CKD stage 4, and referred to Dr. Theador Hawthorne for further workup.  Per note from Dr. Theador Hawthorne on 03/31/2020  - patient has CKD4 with baseline creatinine 2.0-2.3.  Labs obtained 04/09/2020 for work-up of CKD included SPEP that came back faintly positive for M spike (2 faint restricted bands and gammaglobulin region), as well as faint IgG kappa monoclonal immunoglobulin on urine IFE.  Serum light chains were both elevated (kappa 79.6, lambda 54.0), but with normal light chain ratio of 1.47.  Creatinine 2.32, hemoglobin 7.7, calcium 8.6.  Work-up found patient to be significantly anemic, with hemoglobin 7.7 on 04/09/2020.  Ferritin elevated at 553, serum iron 43, low TIBC 226, iron saturation 19%.  Per Dr. Theador Hawthorne, he suspects functional iron deficiency with ferritin sequestration in the setting of chronic disease.  Dr. Theador Hawthorne has started the patient on IV iron fusion and Epogen injections.  She has never had a blood transfusion.  Patient reports a previous history of GI bleeds, EGD in September 2021 revealed erosive gastropathy with multiple 3 mm erosions and stigmata of recent bleeding in the gastric antrum.  Colonoscopy in May 2019 for FOBT+ stool was normal per  records, no biopsies obtained.  During her hospital stay in January 2022, she was found to be Hemoccult stool positive, but was deemed to be too unstable for any further endoscopy.  Unable to have colonoscopy due to currently being on blood thinners.  Patient reports that she has had intermittent black tarry stools ever since she was started on Plavix and Eliquis for her cardiac conditions.  She takes 325 mg daily iron at home.  She is scheduled to follow-up with GI in June 2022, but has not seen them since her hospital stay.  Patient's chief complaint at her visit is persistent fatigue.  Reports that her energy is mildly improved after IV iron infusion and Epogen shot that was given by Dr. Theador Hawthorne last week.  She denies any new bone pain or recent fractures. She denies any B symptoms.  Denies any nausea, vomiting, or diarrhea. Denies any new pains.  No new neurologic symptoms such as new-onset hearing loss, blurred vision, headache, or dizziness.  No numbness or tingling in hands and feet No thromboembolic events. Denies recent chest pain on exertion, shortness of breath on minimal exertion, pre-syncopal episodes, or palpitations.  No new masses or lymphadenopathy per her report.  Patient reports appetite at 75% and energy level at 25%. She is maintaining stable weight at this time.  Her past medical history is otherwise notable for MI and stents x 2 in 2021 and NSTEMI in January 2022; CKD 4, congestive heart failure, atrial fibrillation, and other conditions noted elsewhere in medical record.  She denies history of cancer, thromboembolic events or previous issues with anemia.  Family history notable for sister with breast cancer and lung cancer.  Nephew with non-Hodgkins lymphoma, possibly related to chemical expsoures.    Patient lives at home alone, but has a nephew who lives nearby that she sometimes stays with when she is not feeling well.  She is retired from work in [employer].  She denies significant chemical exposures.  She is a lifelong non-smoker, denies alcohol and illicit drug use.  MEDICAL HISTORY:  Past Medical History:  Diagnosis Date  . Anemia   . Bell's palsy   . Breast cancer ([place]) 1998   Right mastectomy  . CAD (coronary artery disease)    a. s/p STEMI in 01/2019 with DES to mid-LAD  . CHF (congestive heart failure) ([place])    a. EF 30-35% by echo in 01/2019  . Chronic kidney disease   . Essential hypertension   . GERD (gastroesophageal reflux disease)   . Gout   . History of skin cancer    Squamous cell, left shoulder  . Mixed hyperlipidemia   . Osteopenia   . Thyroid nodule   . Type 2 diabetes mellitus ([place])     SURGICAL HISTORY: Past Surgical History:  Procedure Laterality Date  . BIOPSY  10/13/2019   Procedure: BIOPSY;  Surgeon: Daneil Dolin, MD;  [facility];  Service: Endoscopy;;  . CATARACT EXTRACTION  2016  . CORONARY ANGIOGRAPHY N/A 01/16/2020   Procedure: CORONARY ANGIOGRAPHY;  Surgeon: Jettie Booze, MD;  Location: [facility];  Service: Cardiovascular;  Laterality: N/A;  . CORONARY STENT INTERVENTION N/A 01/16/2020   Procedure: CORONARY STENT INTERVENTION;  Surgeon: Jettie Booze, MD;  Location: [facility];  Service: Cardiovascular;  Laterality: N/A;  . CORONARY/GRAFT ACUTE MI REVASCULARIZATION N/A 01/30/2019   Procedure: Coronary/Graft Acute MI Revascularization;  Surgeon: Burnell Blanks, MD;  Location: [facility];  Service: Cardiovascular;  Laterality: N/A;  . ESOPHAGOGASTRODUODENOSCOPY (EGD) WITH PROPOFOL N/A 10/13/2019   Non-obstructing Schatzki ring at GE junction, s/p dilation, erosive gastropathy with stigmata of recent bleeding, normal duodenum. Negative H.pylori.   Marland Kitchen HYSTEROSCOPY    . INTRAVASCULAR ULTRASOUND/IVUS N/A 01/16/2020   Procedure: Intravascular Ultrasound/IVUS;  Surgeon: Jettie Booze, MD;  Location: [facility];   Service: Cardiovascular;  Laterality: N/A;  . LEFT HEART CATH AND CORONARY ANGIOGRAPHY N/A 01/30/2019   Procedure: LEFT HEART CATH AND CORONARY ANGIOGRAPHY;  Surgeon: Burnell Blanks, MD;  Location: [facility];  Service: Cardiovascular;  Laterality: N/A;  . LEFT HEART CATH AND CORONARY ANGIOGRAPHY N/A 01/16/2020   Procedure: LEFT HEART CATH AND CORONARY ANGIOGRAPHY;  Surgeon: Jettie Booze, MD;  Location: [facility];  Service: Cardiovascular;  Laterality: N/A;  . Venia Minks DILATION N/A 10/13/2019   Procedure: Venia Minks DILATION;  Surgeon: Daneil Dolin, MD;  [facility];  Service: Endoscopy;  Laterality: N/A;  . Right mastectomy  1998   [place]  . RIGHT/LEFT HEART CATH AND CORONARY ANGIOGRAPHY N/A 02/13/2020   Procedure: RIGHT/LEFT HEART CATH AND CORONARY ANGIOGRAPHY;  Surgeon: Martinique, Peter M, MD;  Location: [facility];  Service: Cardiovascular;  Laterality: N/A;    SOCIAL HISTORY: Social History   Socioeconomic History  . Marital status: Divorced    Spouse name: Not on file  . Number of children: Not on file  . Years of education: Not on file  . Highest education level: Not on file  Occupational History  . Not on file  Tobacco Use  . Smoking status: Never Smoker  . Smokeless tobacco: Never Used  Vaping Use  . Vaping Use: Never used  Substance and Sexual Activity  . Alcohol use: Never  . Drug use: Never  . Sexual activity: Not on file  Other Topics Concern  . Not on file  Social History Narrative  . Not on file   Social Determinants of Health   Financial Resource Strain: Low Risk   . Difficulty of Paying Living Expenses: Not hard at all  Food Insecurity: No Food Insecurity  . Worried About Charity fundraiser in the Last Year: Never true  . Ran Out of Food in the Last Year: Never true  Transportation Needs: No Transportation Needs  . Lack of Transportation (Medical): No  . Lack of Transportation (Non-Medical): No  Physical  Activity: Insufficiently Active  . Days of Exercise per Week: 1 day  . Minutes of Exercise per Session: 20 min  Stress: No Stress Concern Present  . Feeling of Stress : Not at all  Social Connections: Moderately Isolated  . Frequency of Communication with Friends and Family: More than three times a week  . Frequency of Social Gatherings with Friends and Family: More than three times a week  . Attends Religious Services: 1 to 4 times per year  . Active Member of Clubs or Organizations: No  . Attends Archivist Meetings: Never  . Marital Status: Divorced  Human resources officer Violence: Not At Risk  . Fear of Current or Ex-Partner: No  . Emotionally Abused: No  . Physically Abused: No  . Sexually Abused: No    FAMILY HISTORY: Family History  Problem Relation Age of Onset  . Stroke Mother   . Heart attack Father   . Aneurysm Sister   . Heart attack Maternal Uncle   . Heart disease Maternal Uncle   . Heart attack Paternal Aunt   . Heart disease Paternal Aunt   . Heart attack Paternal Grandfather   . Heart disease Paternal Grandfather   . Heart attack Paternal Aunt   . Heart disease Paternal Aunt   . Heart attack Maternal Uncle   . Heart disease Maternal Uncle   . Heart attack Maternal Uncle   . Heart disease Maternal Uncle   . Heart attack Sister   . Heart disease Sister   . Cancer Sister   . Colon cancer Neg Hx   . Colon polyps Neg Hx     ALLERGIES:  is allergic to bactrim [sulfamethoxazole-trimethoprim], sulfa antibiotics, amlodipine, clonidine derivatives, evista [raloxifene hcl], fosamax [alendronate sodium], glipizide, lipitor [atorvastatin calcium], losartan, pravastatin, and azithromycin.  MEDICATIONS:  Current Outpatient Medications  Medication Sig Dispense Refill  . acetaminophen (TYLENOL) 500 MG tablet Take 500 mg by mouth every 6 (six) hours as needed for headache (pain).    Marland Kitchen amiodarone (PACERONE) 200 MG tablet Take 200 mg by mouth daily.    Marland Kitchen  amLODipine (NORVASC) 5 MG tablet Take 1 tablet (5 mg total) by mouth daily. 30 tablet 2  . apixaban (ELIQUIS) 2.5 MG TABS tablet Take 1 tablet (2.5 mg total) by mouth 2 (two) times daily. 60 tablet 0  . Calcium Carbonate-Vitamin D (CALCIUM 500 + D PO) Take 1 tablet by mouth daily before lunch. Citracal plus D3    . carvedilol (COREG) 25 MG tablet Take 1 tablet (25 mg total) by mouth in the morning and at bedtime. Breakfast & supper 180 tablet 3  . chlorthalidone (HYGROTON) 25 MG tablet  Take by mouth.    . cholecalciferol (VITAMIN D3) 25 MCG (1000 UT) tablet Take 1,000 Units by mouth daily with breakfast.    . CINNAMON PO Take 1,000 mg by mouth daily before lunch.     . clopidogrel (PLAVIX) 75 MG tablet Take 1 tablet (75 mg total) by mouth daily. 90 tablet 3  . diazepam (VALIUM) 2 MG tablet Take 2 mg by mouth 2 (two) times daily as needed (dizzy spells.).    Marland Kitchen epoetin alfa (EPOGEN) 3000 UNIT/ML injection Inject into the skin.    . ferrous sulfate 325 (65 FE) MG tablet Take 325 mg by mouth daily before lunch.    . hydrALAZINE (APRESOLINE) 100 MG tablet TAKE 1 TABLET EVERY 8 HOURS 270 tablet 0  . isosorbide dinitrate (ISORDIL) 30 MG tablet Take by mouth.    . isosorbide mononitrate (IMDUR) 120 MG 24 hr tablet Take 1 tablet (120 mg total) by mouth at bedtime. 30 tablet 0  . loperamide (IMODIUM) 2 MG capsule Take 2-4 mg by mouth 4 (four) times daily as needed for diarrhea or loose stools.    . meclizine (ANTIVERT) 25 MG tablet Take 25 mg by mouth 2 (two) times daily as needed for dizziness.    . Multiple Vitamin (MULTIVITAMIN WITH MINERALS) TABS tablet Take 1 tablet by mouth daily. Centrum Silver for Women    . multivitamin-lutein (OCUVITE-LUTEIN) CAPS capsule Take 1 capsule by mouth daily before lunch.     . nitroGLYCERIN (NITROSTAT) 0.4 MG SL tablet Place 1 tablet (0.4 mg total) under the tongue every 5 (five) minutes x 3 doses as needed for chest pain. 25 tablet 2  . Omega-3 Fatty Acids (FISH OIL  PO) Take 1,576 mg by mouth daily before lunch.    . pantoprazole (PROTONIX) 40 MG tablet Take 1 tablet (40 mg total) by mouth daily. Take 30 minutes before breakfast 90 tablet 3  . potassium chloride SA (KLOR-CON) 20 MEQ tablet Take 2 tablets (40 mEq total) by mouth daily. 60 tablet 2  . rosuvastatin (CRESTOR) 5 MG tablet Take 0.5 tablets (2.5 mg total) by mouth every other day. 30 tablet 0  . saccharomyces boulardii (FLORASTOR) 250 MG capsule Take 250 mg by mouth 2 (two) times daily as needed (diarrhea).     . sodium chloride (OCEAN) 0.65 % SOLN nasal spray Place 1 spray into both nostrils 2 (two) times daily as needed for congestion.     . torsemide (DEMADEX) 20 MG tablet Take 1 tablet (20 mg total) by mouth daily. 90 tablet 0   No current facility-administered medications for this visit.    REVIEW OF SYSTEMS:   Review of Systems  Constitutional: Positive for fatigue. Negative for appetite change, chills, diaphoresis, fever and unexpected weight change.  HENT:   Negative for lump/mass and nosebleeds.   Eyes: Negative for eye problems.  Respiratory: Negative for cough, hemoptysis and shortness of breath.   Cardiovascular: Positive for leg swelling. Negative for chest pain and palpitations.  Gastrointestinal: Positive for blood in stool. Negative for abdominal pain, constipation, diarrhea, nausea and vomiting.  Genitourinary: Negative for hematuria.   Skin: Negative.   Neurological: Negative for dizziness, headaches and light-headedness.  Hematological: Bruises/bleeds easily.      PHYSICAL EXAMINATION: ECOG PERFORMANCE STATUS: 1 - Symptomatic but completely ambulatory  Vitals:   05/01/20 1448  BP: (!) 154/39  Pulse: 77  Resp: 18  Temp: (!) 96.8 F (36 C)  SpO2: 96%   Filed Weights   05/01/20 1448  Weight: 129 lb 3 oz (58.6 kg)    Physical Exam Constitutional:      Appearance: Normal appearance.     Comments: Appears weak and fatigued  HENT:     Head: Normocephalic and  atraumatic.     Mouth/Throat:     Mouth: Mucous membranes are moist.  Eyes:     Extraocular Movements: Extraocular movements intact.     Pupils: Pupils are equal, round, and reactive to light.  Cardiovascular:     Rate and Rhythm: Normal rate. Rhythm irregular.     Pulses: Normal pulses.     Heart sounds: Normal heart sounds.  Pulmonary:     Effort: Pulmonary effort is normal.     Breath sounds: Normal breath sounds.  Abdominal:     General: Bowel sounds are normal.     Palpations: Abdomen is soft.     Tenderness: There is no abdominal tenderness.  Musculoskeletal:        General: No swelling.     Right lower leg: Edema present.     Left lower leg: Edema present.     Comments: 1+ pitting edema of bilateral ankles  Lymphadenopathy:     Cervical: No cervical adenopathy.  Skin:    General: Skin is warm and dry.     Findings: Bruising present.  Neurological:     General: No focal deficit present.     Mental Status: She is alert and oriented to person, place, and time.  Psychiatric:        Mood and Affect: Mood normal.        Behavior: Behavior normal.     LABORATORY DATA: Recent laboratory work-up too massive to be included in note.  Has been reviewed extensively at the time of this consult, and is elsewhere documented in the patient's medical record.  RADIOGRAPHIC STUDIES: I have personally reviewed the radiological images as listed and agreed with the findings in the report. DG Bone Survey Met  Result Date: 05/03/2020 CLINICAL DATA:  MGUS. EXAM: METASTATIC BONE SURVEY COMPARISON:  Chest x-ray 02/28/2020.  CT abdomen pelvis 04/16/2019. FINDINGS: Standard imaging of the axial and appendicular skeleton. Diffuse osteopenia. Several subtle small lucencies are noted in the skull. Questionable subtle lucency noted in the right and left humeri. Prominent thoracolumbar spine scoliosis and diffuse multilevel degenerative change. No acute or focal bony abnormality identified. Osteopenia  makes evaluation difficult. Chest x-ray reveals right base atelectasis and small right pleural effusion. Cardiomegaly again noted. Carotid, aortoiliac, and peripheral vascular calcification. IMPRESSION: 1. Diffuse osteopenia. Several subtle small lucencies are noted in the skull. Questionable subtle lucency noted the right and left humeri. Myeloma cannot be excluded. 2. Chest x-ray reveals right base atelectasis and small right pleural effusion. Cardiomegaly again noted. 3.  Carotid, aortoiliac, and peripheral vascular disease. Electronically Signed   By: Marcello Moores  Register   On: 05/03/2020 10:52    ASSESSMENT: 1.  Monoclonal gammopathy of unspecified significance -Referred by Dr. Theador Hawthorne for monoclonal protein detected during work-up for CKD stage IV -Labs obtained 04/09/2020 show SPEP that came back faintly positive for M spike (2 faint restricted bands and gammaglobulin region), as well as faint IgG kappa monoclonal immunoglobulin on urine IFE.  Serum light chains were both elevated (kappa 79.6, lambda 54.0), but with normal light chain ratio of 1.47.   -Creatinine 2.32, hemoglobin 7.7, calcium 8.6. -Patient does have low hemoglobin and elevated creatinine, but this may be due to other factors and should not be automatically attributed to "crab"/multiple myeloma -  Patient reports significant fatigue, but otherwise is asymptomatic  2.  Multifactorial anemia -Labs (04/09/2020) significantly anemic, with hemoglobin 7.7    -Likely multifactorial in the setting of CKD 4, functional iron deficiency, and suspected chronic GI bleed -Ferritin elevated at 553, serum iron 43, low TIBC 226, iron saturation 19%.   -Per Dr. Theador Hawthorne, he suspects functional iron deficiency with ferritin sequestration in the setting of chronic disease.   -Dr. Theador Hawthorne has started the patient on IV iron fusion and Epogen injections.  She takes 325 mg iron daily at home, likely an aspect of malabsorption in the setting of concurrent PPI  use. -She has never had a blood transfusion.  3.  Suspected chronic GI bleed -Patient reports a previous history of GI bleeds, has had intermittent black tarry stools ever since she was started on blood thinners -EGD in September 2021 revealed erosive gastropathy with multiple 3 mm erosions and stigmata of recent bleeding in the gastric antrum.   -Colonoscopy in May 2019 for FOBT+ stool was normal per records, no biopsies obtained.  -During her hospital stay in January 2022, she was found to be Hemoccult stool positive, but was deemed to be too unstable for any further endoscopy.  Unable to have colonoscopy due to currently being on blood thinners.   -Continues to take Plavix and 2.5 mg Eliquis for her CAD/MI and her atrial fibrillation -She is scheduled to follow-up with GI in June 2022, but has not seen them since her hospital stay in January 2022  4.  Other history -Family history: Sister with breast cancer/lung cancer, nephew with non-Hodgkin's lymphoma -Social history: Retired from work in IT trainer.  Lives alone.  Lifelong non-smoker, no alcohol or illicit drugs. -Patient denies history of cancer, but her medical record does note that she has a history of breast cancer s/p right mastectomy -Other PMH: MI and stents x2 in 2021, NSTEMI in January 2022, CKD 4, congestive heart failure, atrial fibrillation   PLAN:  1.  Monoclonal gammopathy of unspecified significance -Work-up for plasma cell disorder (MGUS versus multiple myeloma) with SPEP, serum IFE, light chains, CBC, LDH, beta-2 microglobulin -Obtain skeletal survey -Consider bone marrow biopsy, depending on the above results -Return to clinic in 2 weeks to discuss results and next steps  2.  Multifactorial anemia -We will check nutritional panel including vitamin B12, folate, copper, methylmalonic acid -Patient will continue to receive IV iron infusions and Epogen injections via Dr. Theador Hawthorne (nephrology) -If  patient CBC has not responded appropriately in the next 6 weeks, will consider bone marrow biopsy  3.  Suspected chronic GI bleed -Recommended to the patient that she follow-up with her GI office as soon as possible in light of suspected chronic GI bleed   PLAN SUMMARY & DISPOSITION: -Labs and skeletal survey -New IV iron and Epogen via Dr. Theador Hawthorne -Follow-up with GI as soon as possible -Return to clinic in 2 weeks to discuss results and next steps  All questions were answered. The patient knows to call the clinic with any problems, questions or concerns.   Medical decision making: Moderate (2 new problems; review of external notes; discussion with external providers Dr. Theador Hawthorne and Roseanne Kaufman, NP; review of previous lab results; ordering new lab radiographic tests)  Time spent on visit: I spent 30 minutes counseling the patient face to face. The total time spent in the appointment was 40 minutes and more than 50% was on counseling.   I, Tarri Abernethy PA-C, have seen this patient in  conjunction with Dr. Derek Jack. Greater than 50% of visit was performed by Dr. Delton Coombes.  I have independently interviewed this patient and agree with HPI written by Tarri Abernethy, PA-C.  We will do myeloma labs today along with skeletal survey.  Dr. Theador Hawthorne to manage effusion and IV iron therapy.  If there is any suspicion of multiple myeloma we will proceed with bone marrow biopsy.   Derek Jack, MD 05/03/20 6:28 PM

## 2020-05-01 NOTE — Patient Instructions (Signed)
[place] at [facility] Discharge Instructions  You were seen today by Dr. Delton Coombes and Tarri Abernethy PA-C for your MGUS (abnormal blood protein).    LABS: Return tomorrow for labs.  OTHER TESTS: Xray (skeletal survey)  MEDICATIONS: No changes  FOLLOW-UP APPOINTMENT:  - Follow up at our office in 2 weeks. - Continue to follow up with Dr. Theador Hawthorne for your kidney disease and iron shots for anemia - Make an appointment to see your GI doctor as soon as possible for suspected bleeding.   Thank you for choosing [place] at [facility] to provide your oncology and hematology care.  To afford each patient quality time with our provider, please arrive at least 15 minutes before your scheduled appointment time.   If you have a lab appointment with the [facility] please come in thru the Main Entrance and check in at the main information desk.  You need to re-schedule your appointment should you arrive 10 or more minutes late.  We strive to give you quality time with our providers, and arriving late affects you and other patients whose appointments are after yours.  Also, if you no show three or more times for appointments you may be dismissed from the clinic at the providers discretion.     Again, thank you for choosing [facility].  Our hope is that these requests will decrease the amount of time that you wait before being seen by our physicians.       _____________________________________________________________  Should you have questions after your visit to [facility], please contact our office at [phone] and follow the prompts.  Our office hours are 8:00 a.m. and 4:30 p.m. Monday - Friday.  Please note that voicemails left after 4:00 p.m. may not be returned until the following business day.  We are closed weekends and major holidays.  You do have access to a nurse 24-7, just call the main number to the  clinic [phone] and do not press any options, hold on the line and a nurse will answer the phone.    For prescription refill requests, have your pharmacy contact our office and allow 72 hours.    Due to Covid, you will need to wear a mask upon entering the hospital. If you do not have a mask, a mask will be given to you at the Main Entrance upon arrival. For doctor visits, patients may have 1 support person age 67 or older with them. For treatment visits, patients can not have anyone with them due to social distancing guidelines and our immunocompromised population.

## 2020-05-02 ENCOUNTER — Ambulatory Visit (HOSPITAL_COMMUNITY)
Admission: RE | Admit: 2020-05-02 | Discharge: 2020-05-02 | Disposition: A | Discharge status: Home / Self Care | Payer: Medicare Other | Source: Ambulatory Visit | Attending: Physician Assistant | Admitting: Physician Assistant

## 2020-05-02 ENCOUNTER — Inpatient Hospital Stay (HOSPITAL_COMMUNITY): Payer: Medicare Other

## 2020-05-02 ENCOUNTER — Other Ambulatory Visit: Payer: Self-pay

## 2020-05-02 ENCOUNTER — Encounter (HOSPITAL_COMMUNITY)
Admission: RE | Admit: 2020-05-02 | Discharge: 2020-05-02 | Disposition: A | Discharge status: Home / Self Care | Payer: Medicare Other | Source: Ambulatory Visit | Attending: Nephrology | Admitting: Nephrology

## 2020-05-02 ENCOUNTER — Other Ambulatory Visit: Payer: Self-pay | Admitting: *Deleted

## 2020-05-02 ENCOUNTER — Telehealth: Payer: Self-pay | Admitting: Gastroenterology

## 2020-05-02 ENCOUNTER — Inpatient Hospital Stay (HOSPITAL_COMMUNITY): Admission: RE | Admit: 2020-05-02 | Payer: Medicare Other | Source: Ambulatory Visit

## 2020-05-02 ENCOUNTER — Encounter (HOSPITAL_COMMUNITY): Payer: Self-pay

## 2020-05-02 DIAGNOSIS — D472 Monoclonal gammopathy: Secondary | ICD-10-CM

## 2020-05-02 DIAGNOSIS — I509 Heart failure, unspecified: Secondary | ICD-10-CM | POA: Diagnosis not present

## 2020-05-02 DIAGNOSIS — I252 Old myocardial infarction: Secondary | ICD-10-CM | POA: Diagnosis not present

## 2020-05-02 DIAGNOSIS — I4891 Unspecified atrial fibrillation: Secondary | ICD-10-CM | POA: Diagnosis not present

## 2020-05-02 DIAGNOSIS — N184 Chronic kidney disease, stage 4 (severe): Secondary | ICD-10-CM | POA: Diagnosis not present

## 2020-05-02 DIAGNOSIS — I129 Hypertensive chronic kidney disease with stage 1 through stage 4 chronic kidney disease, or unspecified chronic kidney disease: Secondary | ICD-10-CM | POA: Diagnosis not present

## 2020-05-02 LAB — CBC WITH DIFFERENTIAL/PLATELET
Abs Immature Granulocytes: 0.08 10*3/uL — ABNORMAL HIGH (ref 0.00–0.07)
Basophils Absolute: 0 10*3/uL (ref 0.0–0.1)
Basophils Relative: 1 %
Eosinophils Absolute: 0.2 10*3/uL (ref 0.0–0.5)
Eosinophils Relative: 2 %
HCT: 22.1 % — ABNORMAL LOW (ref 36.0–46.0)
Hemoglobin: 7 g/dL — ABNORMAL LOW (ref 12.0–15.0)
Immature Granulocytes: 1 %
Lymphocytes Relative: 22 %
Lymphs Abs: 1.5 10*3/uL (ref 0.7–4.0)
MCH: 27 pg (ref 26.0–34.0)
MCHC: 31.7 g/dL (ref 30.0–36.0)
MCV: 85.3 fL (ref 80.0–100.0)
Monocytes Absolute: 1.1 10*3/uL — ABNORMAL HIGH (ref 0.1–1.0)
Monocytes Relative: 15 %
Neutro Abs: 4.3 10*3/uL (ref 1.7–7.7)
Neutrophils Relative %: 59 %
Platelets: 353 10*3/uL (ref 150–400)
RBC: 2.59 MIL/uL — ABNORMAL LOW (ref 3.87–5.11)
RDW: 17.6 % — ABNORMAL HIGH (ref 11.5–15.5)
WBC: 7.2 10*3/uL (ref 4.0–10.5)
nRBC: 0 % (ref 0.0–0.2)

## 2020-05-02 LAB — VITAMIN B12: Vitamin B-12: 487 pg/mL (ref 180–914)

## 2020-05-02 LAB — FOLATE: Folate: 21.4 ng/mL (ref >5.9)

## 2020-05-02 MED ORDER — SODIUM CHLORIDE 0.9 % IV SOLN: Freq: Once | INTRAVENOUS | Status: AC

## 2020-05-02 MED ORDER — SODIUM CHLORIDE 0.9 % IV SOLN
510.0000 mg | Freq: Once | INTRAVENOUS | Status: AC
  Administered 2020-05-02: 510 mg via INTRAVENOUS
  Filled 2020-05-02: qty 510

## 2020-05-02 NOTE — Telephone Encounter (Signed)
Crystal [name], we need to get patient in within the next 2 weeks if at all possible with an APP, preferably me as I have seen her, but she really needs an appt with whomever is available if I'm not. History of multifactorial anemia, history of GI bleed in Sept 2021, on Plavix and Eliquis, noting intermittent black stool but on iron. She is followed closely by Hematology. May need capsule  Thanks!

## 2020-05-03 ENCOUNTER — Other Ambulatory Visit (HOSPITAL_COMMUNITY): Payer: Self-pay | Admitting: Physician Assistant

## 2020-05-03 DIAGNOSIS — E1122 Type 2 diabetes mellitus with diabetic chronic kidney disease: Secondary | ICD-10-CM | POA: Diagnosis not present

## 2020-05-03 DIAGNOSIS — N183 Chronic kidney disease, stage 3 unspecified: Secondary | ICD-10-CM | POA: Diagnosis not present

## 2020-05-03 DIAGNOSIS — I5043 Acute on chronic combined systolic (congestive) and diastolic (congestive) heart failure: Secondary | ICD-10-CM | POA: Diagnosis not present

## 2020-05-03 DIAGNOSIS — I501 Left ventricular failure: Secondary | ICD-10-CM | POA: Diagnosis not present

## 2020-05-03 DIAGNOSIS — I130 Hypertensive heart and chronic kidney disease with heart failure and stage 1 through stage 4 chronic kidney disease, or unspecified chronic kidney disease: Secondary | ICD-10-CM | POA: Diagnosis not present

## 2020-05-03 DIAGNOSIS — I160 Hypertensive urgency: Secondary | ICD-10-CM | POA: Diagnosis not present

## 2020-05-03 DIAGNOSIS — D500 Iron deficiency anemia secondary to blood loss (chronic): Secondary | ICD-10-CM

## 2020-05-03 LAB — IMMUNOFIXATION ELECTROPHORESIS
IgA: 188 mg/dL (ref 64–422)
IgG (Immunoglobin G), Serum: 1250 mg/dL (ref 586–1602)
IgM (Immunoglobulin M), Srm: 80 mg/dL (ref 26–217)
Total Protein ELP: 6.4 g/dL (ref 6.0–8.5)

## 2020-05-03 LAB — KAPPA/LAMBDA LIGHT CHAINS
Kappa free light chain: 76.6 mg/L — ABNORMAL HIGH (ref 3.3–19.4)
Kappa, lambda light chain ratio: 1.48 (ref 0.26–1.65)
Lambda free light chains: 51.7 mg/L — ABNORMAL HIGH (ref 5.7–26.3)

## 2020-05-03 LAB — BETA 2 MICROGLOBULIN, SERUM: Beta-2 Microglobulin: 7.8 mg/L — ABNORMAL HIGH (ref 0.6–2.4)

## 2020-05-03 NOTE — Patient Outreach (Signed)
[place] [facility]) Care Management  [facility] Care Manager  05/03/2020   Crystal [name] [birth year] [id number]  Subjective: Telephone outreach to follow up on HF, CAD, mobility  Encounter Medications:  Outpatient Encounter Medications as of 05/02/2020  Medication Sig  . acetaminophen (TYLENOL) 500 MG tablet Take 500 mg by mouth every 6 (six) hours as needed for headache (pain).  Marland Kitchen amiodarone (PACERONE) 200 MG tablet Take 200 mg by mouth daily.  Marland Kitchen amLODipine (NORVASC) 5 MG tablet Take 1 tablet (5 mg total) by mouth daily.  Marland Kitchen apixaban (ELIQUIS) 2.5 MG TABS tablet Take 1 tablet (2.5 mg total) by mouth 2 (two) times daily.  . Calcium Carbonate-Vitamin D (CALCIUM 500 + D PO) Take 1 tablet by mouth daily before lunch. Citracal plus D3  . carvedilol (COREG) 25 MG tablet Take 1 tablet (25 mg total) by mouth in the morning and at bedtime. Breakfast & supper  . chlorthalidone (HYGROTON) 25 MG tablet Take by mouth.  . cholecalciferol (VITAMIN D3) 25 MCG (1000 UT) tablet Take 1,000 Units by mouth daily with breakfast.  . CINNAMON PO Take 1,000 mg by mouth daily before lunch.   . clopidogrel (PLAVIX) 75 MG tablet Take 1 tablet (75 mg total) by mouth daily.  . diazepam (VALIUM) 2 MG tablet Take 2 mg by mouth 2 (two) times daily as needed (dizzy spells.).  Marland Kitchen epoetin alfa (EPOGEN) 3000 UNIT/ML injection Inject into the skin.  . ferrous sulfate 325 (65 FE) MG tablet Take 325 mg by mouth daily before lunch.  . hydrALAZINE (APRESOLINE) 100 MG tablet TAKE 1 TABLET EVERY 8 HOURS  . isosorbide dinitrate (ISORDIL) 30 MG tablet Take by mouth.  . isosorbide mononitrate (IMDUR) 120 MG 24 hr tablet Take 1 tablet (120 mg total) by mouth at bedtime.  Marland Kitchen loperamide (IMODIUM) 2 MG capsule Take 2-4 mg by mouth 4 (four) times daily as needed for diarrhea or loose stools.  . meclizine (ANTIVERT) 25 MG tablet Take 25 mg by mouth 2 (two) times daily as needed for dizziness.  . Multiple Vitamin (MULTIVITAMIN WITH  MINERALS) TABS tablet Take 1 tablet by mouth daily. Centrum Silver for Women  . multivitamin-lutein (OCUVITE-LUTEIN) CAPS capsule Take 1 capsule by mouth daily before lunch.   . nitroGLYCERIN (NITROSTAT) 0.4 MG SL tablet Place 1 tablet (0.4 mg total) under the tongue every 5 (five) minutes x 3 doses as needed for chest pain.  . Omega-3 Fatty Acids (FISH OIL PO) Take 1,576 mg by mouth daily before lunch.  . pantoprazole (PROTONIX) 40 MG tablet Take 1 tablet (40 mg total) by mouth daily. Take 30 minutes before breakfast  . potassium chloride SA (KLOR-CON) 20 MEQ tablet Take 2 tablets (40 mEq total) by mouth daily.  . rosuvastatin (CRESTOR) 5 MG tablet Take 0.5 tablets (2.5 mg total) by mouth every other day.  Marland Kitchen saccharomyces boulardii (FLORASTOR) 250 MG capsule Take 250 mg by mouth 2 (two) times daily as needed (diarrhea).   . sodium chloride (OCEAN) 0.65 % SOLN nasal spray Place 1 spray into both nostrils 2 (two) times daily as needed for congestion.   . torsemide (DEMADEX) 20 MG tablet Take 1 tablet (20 mg total) by mouth daily.   No facility-administered encounter medications on file as of 05/02/2020.    Functional Status:  In your present state of health, do you have any difficulty performing the following activities: 04/12/2020 02/13/2020  Hearing? - N  Vision? - N  Difficulty concentrating or making decisions? - N  Walking or climbing stairs? - Y  Dressing or bathing? - N  Doing errands, shopping? - Y  Preparing Food and eating ? N -  Comment Late entry for 02/03/20. CCS -  Using the Toilet? N -  Comment Late entry for 02/03/20. CCS -  In the past six months, have you accidently leaked urine? Y -  Comment Late entry for 02/03/20. CCS -  Do you have problems with loss of bowel control? N -  Comment Late entry for 02/03/20. CCS -  Managing your Medications? N -  Comment Late entry for 02/03/20. CCS -  Managing your Finances? N -  Comment Late entry for 02/03/20. CCS -  Housekeeping or managing  your Housekeeping? Y -  Comment Late entry for 02/03/20. CCS -  Some recent data might be hidden    Fall/Depression Screening: Fall Risk  04/12/2020 03/04/2019  Falls in the past year? 0 0  Comment Late entry for 02/03/20. CCS -  Number falls in past yr: 0 -  Comment Late entry for 02/03/20. CCS -  Injury with Fall? 0 -  Comment Late entry for 02/03/20. CCS -  Risk for fall due to : Medication side effect -  Risk for fall due to: Comment Late entry for 02/03/20. CCS -  Follow up Falls evaluation completed -  Comment Late entry for 02/03/20. CCS -   PHQ 2/9 Scores 05/01/2020 04/12/2020 03/04/2019  PHQ - 2 Score 0 2 0  PHQ- 9 Score - 4 -    Assessment:  Hx MI                          CHF - stable                          GI bleeding                          Low Hgb 7.0                          Hematology assessment in progress.  Goals Addressed              This Visit's Progress     Patient Stated   .  [facility]) Patient Stated (pt-stated)   On track     Pt will call MD or NP if has increasing sxs HF (wt. Gain, wheezing, SOB, Edema) to overt an ED visit over the next 3 months.  Start date 10/23/20, renewed on 03/07/20. High Priority Long term goal Expected end date: 06/26/20  03/07/20 Pt has followed her goal but had to go to the ED and be hospitalized for subsequent MI. Reinforced HF and CAD Action plan to weigh daily, take meds, activity to tolerance, go to appts and report any heart sxs early to MD to prevent complications and subsequent trips to the hospital. 03/14/20 Following HF Action plan. Wt is stable, no signs of HF. Pt acknowledges she knows when to call for advice. She will have video visit with cardiology tomorrow She feels she is making progress, striving to get her independence back. She feels she is less SOB and it is evident in talking with her, she is less SOB when talking. Encouraged her to give all her effort in her therapy sessions to progress and to be safe. 03/21/20 No acute  probems, progressing nicely.  Encouraged continued efforts, attending appts, following medical recommendations. Celebrated that she is happy with her progress and that she IS getting better. 03/28/20 Pt reports continueous improvements in her phycial recovery. No HF exacerbating sxs reported. Knows when to call for help. Reinforced Action Plan. 04/04/20 Wt stable between 127-129#, no SOB and minimal edema. General overall health improvement. Praised for her continued self managed and encouraged to continue this life long regimen. 05/03/20 Wt 129#. Has some SOB on exertion but note her Hgb has fallen to 7.0! She is being worked up by hematology, has had a bone scan. She has had a couple of dark stools and has reported this to her GI provider. Dr. Cyndi Bender is trying to get her in the office ASAP. She has moved back to her own home. She is able to get around cautiously using her walker. They kids are checking in on her daily. She carries her cell phone with her at all times to call for help if she needs to.      Other   .  Comorbidities Identified and Managed and documented with pt self management reported each month over the next 3 months.   On track     Pt goal started: 04/04/20 Long term goal High priority Follow up 05/11/20 Expected completion; 07/26/20  Evidence-based guidance:   Assess and address signs/symptoms of comorbidity, including dyslipidemia, diabetes, coronary artery disease, kidney dysfunction.  Monitor labs and identify any gaps in medical tx.  Follow self management        Notes: 04/04/20 Other comorbidities to monitor: DM, CKD, CAD. Chart review revealed Diabetes: Lipids were normal in January! HgbA1C was 6.0 in December! Does not check her glucose at home very often as this is well controlled. Reports she had a foot exam last fall, will have her eye exam: tomorrow.  CKD: labs ordered by nephrology, will get labs tomorrow and pick up jug for 24 hour urine collection. Next appt is last week of  Marchl for follow up and discussion regarding lab results. No new CAD sxs and follow up with cardiology scheduled for May.        Plan: we agreed to talk again in 2 weeks.  Follow-up:  Patient agrees to Care Plan and Follow-up.   [name]. [name], MSN, [facility] Gerontological Nurse Practitioner [facility] Care Management [phone]

## 2020-05-03 NOTE — Progress Notes (Signed)
CBC obtained on Tuesday 05/01/20 showed drop in hemoglobin to 7.0 (was 9.2 in February).  I did connect with the patient over the phone today, and she reports that she has not noticed any changes.  She does report intermittent melena, as noted during office visit on 05/01/2020.  Last episode of melena was about 1 week ago.  She continues to have general fatigue, but denies chest pain, dyspnea on exertion, palpitations, presyncopal episodes.  Due to drop in hemoglobin, patient will be scheduled for repeat CBC and an office visit with me on Tuesday, 05/08/2020.  She may need to receive blood transfusion while she is here.  She was told to stop Eliquis if she has any further melena.  We will also hold off on her oral iron, to see if this is the cause of the melena (since she is already receiving IV iron from Dr. Theador Hawthorne).  Patient instructed to go to the ER if she has any further melena before she is seen on Tuesday, or if she has chest pain, dyspnea, palpitations, or presyncopal symptoms.  I have spoken with Roseanne Kaufman, NP with gastroenterology services, who is trying to get the patient into the office to see her as soon as possible.  I have also reached out to Dr. Domenic Polite (cardiology) and Dr. Theador Hawthorne (nephrology) to make sure that they are aware of the situation as well.

## 2020-05-03 NOTE — Telephone Encounter (Signed)
No one has anything until end of May. I will add to a cancellation  list.

## 2020-05-04 LAB — PROTEIN ELECTROPHORESIS, SERUM
A/G Ratio: 0.9 (ref 0.7–1.7)
Albumin ELP: 3 g/dL (ref 2.9–4.4)
Alpha-1-Globulin: 0.4 g/dL (ref 0.0–0.4)
Alpha-2-Globulin: 0.9 g/dL (ref 0.4–1.0)
Beta Globulin: 0.7 g/dL (ref 0.7–1.3)
Gamma Globulin: 1.2 g/dL (ref 0.4–1.8)
Globulin, Total: 3.3 g/dL (ref 2.2–3.9)
M-Spike, %: 0.4 g/dL — ABNORMAL HIGH
Total Protein ELP: 6.3 g/dL (ref 6.0–8.5)

## 2020-05-04 NOTE — Telephone Encounter (Signed)
[facility] clinical pool:  Can we find out if patient would be willing to do a capsule study? She has multifactorial anemia, receiving IV iron, Epogen injections, concern for melena, recent drop in Hgb from 9 in Feb 2022 to 7 recently. On Eliquis and Plavix (recent stent placement), and Eliquis will be held but continuing Plavix unless absolutely needing to stop. Hematology following her closely.  Rebekah: will try to arrange capsule study so we can evaluate small bowel in a timely fashion. Appreciate your assistance!

## 2020-05-05 LAB — COPPER, SERUM: Copper: 107 ug/dL (ref 80–158)

## 2020-05-07 ENCOUNTER — Ambulatory Visit (HOSPITAL_COMMUNITY): Payer: Medicare Other | Admitting: Physician Assistant

## 2020-05-07 ENCOUNTER — Inpatient Hospital Stay (HOSPITAL_COMMUNITY): Payer: Medicare Other

## 2020-05-07 NOTE — Telephone Encounter (Signed)
Called patient. She states she has already had the capsule study done before. She said it was either before or after her EGD with Dr. Gala Romney. I do not see report of this. Please advise Crystal [name]. Thanks!

## 2020-05-07 NOTE — Telephone Encounter (Signed)
LMOVM for pt 

## 2020-05-08 ENCOUNTER — Inpatient Hospital Stay (HOSPITAL_BASED_OUTPATIENT_CLINIC_OR_DEPARTMENT_OTHER): Payer: Medicare Other | Admitting: Physician Assistant

## 2020-05-08 ENCOUNTER — Other Ambulatory Visit: Payer: Self-pay

## 2020-05-08 ENCOUNTER — Inpatient Hospital Stay (HOSPITAL_COMMUNITY): Payer: Medicare Other

## 2020-05-08 ENCOUNTER — Encounter (HOSPITAL_COMMUNITY)
Admission: RE | Admit: 2020-05-08 | Discharge: 2020-05-08 | Disposition: A | Discharge status: Home / Self Care | Payer: Medicare Other | Source: Ambulatory Visit | Attending: Nephrology | Admitting: Nephrology

## 2020-05-08 ENCOUNTER — Encounter (HOSPITAL_COMMUNITY): Payer: Self-pay

## 2020-05-08 DIAGNOSIS — I252 Old myocardial infarction: Secondary | ICD-10-CM | POA: Diagnosis not present

## 2020-05-08 DIAGNOSIS — I509 Heart failure, unspecified: Secondary | ICD-10-CM | POA: Diagnosis not present

## 2020-05-08 DIAGNOSIS — D472 Monoclonal gammopathy: Secondary | ICD-10-CM | POA: Diagnosis not present

## 2020-05-08 DIAGNOSIS — D500 Iron deficiency anemia secondary to blood loss (chronic): Secondary | ICD-10-CM

## 2020-05-08 DIAGNOSIS — N184 Chronic kidney disease, stage 4 (severe): Secondary | ICD-10-CM | POA: Diagnosis not present

## 2020-05-08 DIAGNOSIS — I129 Hypertensive chronic kidney disease with stage 1 through stage 4 chronic kidney disease, or unspecified chronic kidney disease: Secondary | ICD-10-CM | POA: Diagnosis not present

## 2020-05-08 DIAGNOSIS — I4891 Unspecified atrial fibrillation: Secondary | ICD-10-CM | POA: Diagnosis not present

## 2020-05-08 LAB — CBC WITH DIFFERENTIAL/PLATELET
Abs Immature Granulocytes: 0.06 10*3/uL (ref 0.00–0.07)
Basophils Absolute: 0.1 10*3/uL (ref 0.0–0.1)
Basophils Relative: 1 %
Eosinophils Absolute: 0.2 10*3/uL (ref 0.0–0.5)
Eosinophils Relative: 3 %
HCT: 26.2 % — ABNORMAL LOW (ref 36.0–46.0)
Hemoglobin: 8.1 g/dL — ABNORMAL LOW (ref 12.0–15.0)
Immature Granulocytes: 1 %
Lymphocytes Relative: 21 %
Lymphs Abs: 1.4 10*3/uL (ref 0.7–4.0)
MCH: 27.2 pg (ref 26.0–34.0)
MCHC: 30.9 g/dL (ref 30.0–36.0)
MCV: 87.9 fL (ref 80.0–100.0)
Monocytes Absolute: 0.9 10*3/uL (ref 0.1–1.0)
Monocytes Relative: 13 %
Neutro Abs: 4 10*3/uL (ref 1.7–7.7)
Neutrophils Relative %: 61 %
Platelets: 356 10*3/uL (ref 150–400)
RBC: 2.98 MIL/uL — ABNORMAL LOW (ref 3.87–5.11)
RDW: 18.1 % — ABNORMAL HIGH (ref 11.5–15.5)
WBC: 6.5 10*3/uL (ref 4.0–10.5)
nRBC: 0 % (ref 0.0–0.2)

## 2020-05-08 LAB — SAMPLE TO BLOOD BANK

## 2020-05-08 LAB — METHYLMALONIC ACID, SERUM: Methylmalonic Acid, Quantitative: 412 nmol/L — ABNORMAL HIGH (ref 0–378)

## 2020-05-08 MED ORDER — LANREOTIDE ACETATE 120 MG/0.5ML ~~LOC~~ SOLN
SUBCUTANEOUS | Status: AC
  Filled 2020-05-08: qty 120

## 2020-05-08 MED ORDER — EPOETIN ALFA-EPBX 3000 UNIT/ML IJ SOLN
3000.0000 [IU] | Freq: Once | INTRAMUSCULAR | Status: AC
  Administered 2020-05-08: 3000 [IU] via SUBCUTANEOUS
  Filled 2020-05-08: qty 1

## 2020-05-08 NOTE — Progress Notes (Signed)
[facility] [address] [place] [address]   CLINIC:  Medical Oncology/Hematology  PCP:  Glenda Chroman, MD [address] [place] [address] [phone]   REASON FOR VISIT:  Follow-up for anemia and MGUS  PRIOR THERAPY: None  CURRENT THERAPY: Under work-up  INTERVAL HISTORY:  [name] 86 y.o. female returns for routine follow-up of her anemia and to discuss lab results regarding her new diagnosis of MGUS.  She reports persistent fatigue, but I able to manage her ADLs with frequent rests.   She feels a little bit better after the IV iron infusion and Retacrit shot she received from Dr. Theador Hawthorne.  She stopped taking iron pill on Thursday - her last dark stool was on Friday.  She has stopped taking Eliquis until she is able to be evaluated by GI for possible gastrointestinal bleeding.   She has 50% energy and 100% appetite. She endorses that she is maintaining a stable weight.  No other acute changes or new complaints since her last visit.  REVIEW OF SYSTEMS:  Review of Systems  Constitutional: Positive for fatigue. Negative for appetite change, chills, diaphoresis, fever and unexpected weight change.  HENT:   Negative for lump/mass and nosebleeds.   Eyes: Negative for eye problems.  Respiratory: Negative for cough, hemoptysis and shortness of breath.   Cardiovascular: Positive for leg swelling. Negative for chest pain and palpitations.  Gastrointestinal: Positive for blood in stool. Negative for abdominal pain, constipation, diarrhea, nausea and vomiting.  Genitourinary: Negative for hematuria.   Skin: Negative.   Neurological: Negative for dizziness, headaches and light-headedness.  Hematological: Bruises/bleeds easily.      PAST MEDICAL/SURGICAL HISTORY:  Past Medical History:  Diagnosis Date  . Anemia   . Bell's palsy   . Breast cancer ([facility]) 1998   Right mastectomy  . CAD (coronary artery disease)    a. s/p STEMI in 01/2019 with DES to  mid-LAD  . CHF (congestive heart failure) ([place])    a. EF 30-35% by echo in 01/2019  . Chronic kidney disease   . Essential hypertension   . GERD (gastroesophageal reflux disease)   . Gout   . History of skin cancer    Squamous cell, left shoulder  . Mixed hyperlipidemia   . Osteopenia   . Thyroid nodule   . Type 2 diabetes mellitus ([place])    Past Surgical History:  Procedure Laterality Date  . BIOPSY  10/13/2019   Procedure: BIOPSY;  Surgeon: Daneil Dolin, MD;  [facility];  Service: Endoscopy;;  . CATARACT EXTRACTION  2016  . CORONARY ANGIOGRAPHY N/A 01/16/2020   Procedure: CORONARY ANGIOGRAPHY;  Surgeon: Jettie Booze, MD;  Location: [facility];  Service: Cardiovascular;  Laterality: N/A;  . CORONARY STENT INTERVENTION N/A 01/16/2020   Procedure: CORONARY STENT INTERVENTION;  Surgeon: Jettie Booze, MD;  Location: [facility];  Service: Cardiovascular;  Laterality: N/A;  . CORONARY/GRAFT ACUTE MI REVASCULARIZATION N/A 01/30/2019   Procedure: Coronary/Graft Acute MI Revascularization;  Surgeon: Burnell Blanks, MD;  Location: [facility];  Service: Cardiovascular;  Laterality: N/A;  . ESOPHAGOGASTRODUODENOSCOPY (EGD) WITH PROPOFOL N/A 10/13/2019   Non-obstructing Schatzki ring at GE junction, s/p dilation, erosive gastropathy with stigmata of recent bleeding, normal duodenum. Negative H.pylori.   Marland Kitchen HYSTEROSCOPY    . INTRAVASCULAR ULTRASOUND/IVUS N/A 01/16/2020   Procedure: Intravascular Ultrasound/IVUS;  Surgeon: Jettie Booze, MD;  Location: [facility];  Service: Cardiovascular;  Laterality: N/A;  . LEFT HEART CATH AND CORONARY ANGIOGRAPHY N/A 01/30/2019   Procedure: LEFT HEART CATH AND CORONARY ANGIOGRAPHY;  Surgeon: Burnell Blanks, MD;  Location: [facility];  Service: Cardiovascular;  Laterality: N/A;  . LEFT HEART CATH AND CORONARY ANGIOGRAPHY N/A 01/16/2020   Procedure: LEFT HEART CATH AND  CORONARY ANGIOGRAPHY;  Surgeon: Jettie Booze, MD;  Location: [facility];  Service: Cardiovascular;  Laterality: N/A;  . Venia Minks DILATION N/A 10/13/2019   Procedure: Venia Minks DILATION;  Surgeon: Daneil Dolin, MD;  [facility];  Service: Endoscopy;  Laterality: N/A;  . Right mastectomy  1998   [facility]  . RIGHT/LEFT HEART CATH AND CORONARY ANGIOGRAPHY N/A 02/13/2020   Procedure: RIGHT/LEFT HEART CATH AND CORONARY ANGIOGRAPHY;  Surgeon: Martinique, Peter M, MD;  Location: [facility];  Service: Cardiovascular;  Laterality: N/A;     SOCIAL HISTORY:  Social History   Socioeconomic History  . Marital status: Divorced    Spouse name: Not on file  . Number of children: Not on file  . Years of education: Not on file  . Highest education level: Not on file  Occupational History  . Not on file  Tobacco Use  . Smoking status: Never Smoker  . Smokeless tobacco: Never Used  Vaping Use  . Vaping Use: Never used  Substance and Sexual Activity  . Alcohol use: Never  . Drug use: Never  . Sexual activity: Not on file  Other Topics Concern  . Not on file  Social History Narrative  . Not on file   Social Determinants of Health   Financial Resource Strain: Low Risk   . Difficulty of Paying Living Expenses: Not hard at all  Food Insecurity: No Food Insecurity  . Worried About Charity fundraiser in the Last Year: Never true  . Ran Out of Food in the Last Year: Never true  Transportation Needs: No Transportation Needs  . Lack of Transportation (Medical): No  . Lack of Transportation (Non-Medical): No  Physical Activity: Insufficiently Active  . Days of Exercise per Week: 1 day  . Minutes of Exercise per Session: 20 min  Stress: No Stress Concern Present  . Feeling of Stress : Not at all  Social Connections: Moderately Isolated  . Frequency of Communication with Friends and Family: More than three times a week  . Frequency of Social Gatherings with Friends  and Family: More than three times a week  . Attends Religious Services: 1 to 4 times per year  . Active Member of Clubs or Organizations: No  . Attends Archivist Meetings: Never  . Marital Status: Divorced  Human resources officer Violence: Not At Risk  . Fear of Current or Ex-Partner: No  . Emotionally Abused: No  . Physically Abused: No  . Sexually Abused: No    FAMILY HISTORY:  Family History  Problem Relation Age of Onset  . Stroke Mother   . Heart attack Father   . Aneurysm Sister   . Heart attack Maternal Uncle   . Heart disease Maternal Uncle   . Heart attack Paternal Aunt   . Heart disease Paternal Aunt   . Heart attack Paternal Grandfather   . Heart disease Paternal Grandfather   . Heart attack Paternal Aunt   . Heart disease Paternal Aunt   . Heart attack Maternal Uncle   . Heart disease Maternal Uncle   . Heart attack Maternal Uncle   . Heart disease Maternal  Uncle   . Heart attack Sister   . Heart disease Sister   . Cancer Sister   . Colon cancer Neg Hx   . Colon polyps Neg Hx     CURRENT MEDICATIONS:  Outpatient Encounter Medications as of 05/08/2020  Medication Sig  . acetaminophen (TYLENOL) 500 MG tablet Take 500 mg by mouth every 6 (six) hours as needed for headache (pain).  Marland Kitchen amiodarone (PACERONE) 200 MG tablet Take 200 mg by mouth daily.  Marland Kitchen amLODipine (NORVASC) 5 MG tablet Take 1 tablet (5 mg total) by mouth daily.  Marland Kitchen apixaban (ELIQUIS) 2.5 MG TABS tablet Take 1 tablet (2.5 mg total) by mouth 2 (two) times daily.  . Calcium Carbonate-Vitamin D (CALCIUM 500 + D PO) Take 1 tablet by mouth daily before lunch. Citracal plus D3  . carvedilol (COREG) 25 MG tablet Take 1 tablet (25 mg total) by mouth in the morning and at bedtime. Breakfast & supper  . chlorthalidone (HYGROTON) 25 MG tablet Take by mouth.  . cholecalciferol (VITAMIN D3) 25 MCG (1000 UT) tablet Take 1,000 Units by mouth daily with breakfast.  . CINNAMON PO Take 1,000 mg by mouth daily  before lunch.   . clopidogrel (PLAVIX) 75 MG tablet Take 1 tablet (75 mg total) by mouth daily.  . diazepam (VALIUM) 2 MG tablet Take 2 mg by mouth 2 (two) times daily as needed (dizzy spells.).  Marland Kitchen epoetin alfa (EPOGEN) 3000 UNIT/ML injection Inject into the skin.  . ferrous sulfate 325 (65 FE) MG tablet Take 325 mg by mouth daily before lunch.  . hydrALAZINE (APRESOLINE) 100 MG tablet TAKE 1 TABLET EVERY 8 HOURS  . isosorbide dinitrate (ISORDIL) 30 MG tablet Take by mouth.  . isosorbide mononitrate (IMDUR) 120 MG 24 hr tablet Take 1 tablet (120 mg total) by mouth at bedtime.  Marland Kitchen loperamide (IMODIUM) 2 MG capsule Take 2-4 mg by mouth 4 (four) times daily as needed for diarrhea or loose stools.  . meclizine (ANTIVERT) 25 MG tablet Take 25 mg by mouth 2 (two) times daily as needed for dizziness.  . Multiple Vitamin (MULTIVITAMIN WITH MINERALS) TABS tablet Take 1 tablet by mouth daily. Centrum Silver for Women  . multivitamin-lutein (OCUVITE-LUTEIN) CAPS capsule Take 1 capsule by mouth daily before lunch.   . nitroGLYCERIN (NITROSTAT) 0.4 MG SL tablet Place 1 tablet (0.4 mg total) under the tongue every 5 (five) minutes x 3 doses as needed for chest pain.  . Omega-3 Fatty Acids (FISH OIL PO) Take 1,576 mg by mouth daily before lunch.  . pantoprazole (PROTONIX) 40 MG tablet Take 1 tablet (40 mg total) by mouth daily. Take 30 minutes before breakfast  . potassium chloride SA (KLOR-CON) 20 MEQ tablet Take 2 tablets (40 mEq total) by mouth daily.  . rosuvastatin (CRESTOR) 5 MG tablet Take 0.5 tablets (2.5 mg total) by mouth every other day.  Marland Kitchen saccharomyces boulardii (FLORASTOR) 250 MG capsule Take 250 mg by mouth 2 (two) times daily as needed (diarrhea).   . sodium chloride (OCEAN) 0.65 % SOLN nasal spray Place 1 spray into both nostrils 2 (two) times daily as needed for congestion.   . torsemide (DEMADEX) 20 MG tablet Take 1 tablet (20 mg total) by mouth daily.   No facility-administered encounter  medications on file as of 05/08/2020.    ALLERGIES:  Allergies  Allergen Reactions  . Bactrim [Sulfamethoxazole-Trimethoprim] Nausea And Vomiting  . Sulfa Antibiotics Nausea And Vomiting  . Amlodipine Other (See Comments)    EDEMA  .  Clonidine Derivatives Other (See Comments)    FATIGUE  . Evista [Raloxifene Hcl] Other (See Comments)    GERD  . Fosamax [Alendronate Sodium] Other (See Comments)    INCREASED GERD   . Glipizide Other (See Comments)    PALPITATIONS  . Lipitor [Atorvastatin Calcium] Other (See Comments)    ACHING  . Losartan Other (See Comments)    FATIGUE   . Pravastatin Other (See Comments)    ACHING  . Azithromycin Rash and Other (See Comments)    FACIAL BURNING      PHYSICAL EXAM:  ECOG PERFORMANCE STATUS: 1 - Symptomatic but completely ambulatory  Vitals:   05/08/20 0850  Pulse: 67  Resp: 18  Temp: (!) 96.8 F (36 C)  SpO2: 99%   Filed Weights   05/08/20 0850  Weight: 131 lb 4.8 oz (59.6 kg)   Physical Exam Constitutional:      Appearance: Normal appearance.  HENT:     Head: Normocephalic and atraumatic.     Mouth/Throat:     Mouth: Mucous membranes are moist.  Eyes:     Extraocular Movements: Extraocular movements intact.     Pupils: Pupils are equal, round, and reactive to light.  Cardiovascular:     Rate and Rhythm: Normal rate. Rhythm irregular.     Pulses: Normal pulses.     Heart sounds: Normal heart sounds.  Pulmonary:     Effort: Pulmonary effort is normal.     Breath sounds: Normal breath sounds.  Abdominal:     General: Bowel sounds are normal.     Palpations: Abdomen is soft.     Tenderness: There is no abdominal tenderness.  Musculoskeletal:        General: No swelling.     Right lower leg: Edema present.     Left lower leg: Edema present.     Comments: 1+ pitting edema of bilateral ankles  Lymphadenopathy:     Cervical: No cervical adenopathy.  Skin:    General: Skin is warm and dry.     Findings: Bruising present.   Neurological:     General: No focal deficit present.     Mental Status: She is alert and oriented to person, place, and time.  Psychiatric:        Mood and Affect: Mood normal.        Behavior: Behavior normal.      LABORATORY DATA:  I have reviewed the labs as listed.  CBC    Component Value Date/Time   WBC 6.5 05/08/2020 0803   RBC 2.98 (L) 05/08/2020 0803   HGB 8.1 (L) 05/08/2020 0803   HCT 26.2 (L) 05/08/2020 0803   PLT 356 05/08/2020 0803   MCV 87.9 05/08/2020 0803   MCH 27.2 05/08/2020 0803   MCHC 30.9 05/08/2020 0803   RDW 18.1 (H) 05/08/2020 0803   LYMPHSABS 1.4 05/08/2020 0803   MONOABS 0.9 05/08/2020 0803   EOSABS 0.2 05/08/2020 0803   BASOSABS 0.1 05/08/2020 0803   CMP Latest Ref Rng & Units 03/04/2020 03/03/2020 03/02/2020  Glucose 70 - 99 mg/dL 133(H) 107(H) 102(H)  BUN 8 - 23 mg/dL 42(H) 47(H) 42(H)  Creatinine 0.44 - 1.00 mg/dL 2.02(H) 2.14(H) 1.87(H)  Sodium 135 - 145 mmol/L 135 132(L) 134(L)  Potassium 3.5 - 5.1 mmol/L 4.0 3.7 3.6  Chloride 98 - 111 mmol/L 98 98 97(L)  CO2 22 - 32 mmol/L 24 22 25   Calcium 8.9 - 10.3 mg/dL 8.3(L) 8.3(L) 8.5(L)  Total Protein 6.5 - 8.1  g/dL - - -  Total Bilirubin 0.3 - 1.2 mg/dL - - -  Alkaline Phos 38 - 126 U/L - - -  AST 15 - 41 U/L - - -  ALT 0 - 44 U/L - - -    DIAGNOSTIC IMAGING:  I have independently reviewed the relevant imaging and discussed with the patient.  ASSESSMENT: 1.  Monoclonal gammopathy of unspecified significance - IgG kappa -Referred by Dr. Theador Hawthorne for monoclonal protein detected during work-up for CKD stage IV -Work-up from 05/02/2020 confirms MGUS (versus multiple myeloma, pending further work-up) with 0.4% M spike on SPEP, IFE showing IgG monoclonal protein with kappa light chain specificity; normal free light chain ratio 1.48, but with elevated kappa 76.6, and elevated lambda 51.7; beta-2 microglobulin elevated at 7.8 -Skeletal survey (05/02/2020): Diffuse osteopenia with several subtle small  lucencies noted in the skull and questionable subtle lucency noted on the right and left humeri, myeloma cannot be excluded per radiology report -Patient does have low hemoglobin and elevated creatinine, but this may be due to other factors - further for myeloma is in progress, as below -Patient reports significant fatigue, but otherwise is asymptomatic  2.  Multifactorial anemia -Labs on 05/02/2020 show hemoglobin 7.0, repeat CBC on 05/08/2020 improved to hemoglobin 8.1 -Likely multifactorial in the setting of CKD 4, functional iron deficiency, and suspected chronic GI bleed - patient also under work-up for small multiple myeloma as above -Ferritin elevated at 553, serum iron 43, low TIBC 226, iron saturation 19%. -Normal B12, folate, methylmalonic acid -Dr. Theador Hawthorne has started the patient on IV iron fusion and Epogen injections.  -Patient was previously on oral iron supplement, but this has been discontinued due to lack of efficacy and malabsorption -She has never had a blood transfusion.  3.  Suspected chronic GI bleed -Patient reports a previous history of GI bleeds, has had intermittent black tarry stools ever since she was started on blood thinners -EGD in September 2021 revealed erosive gastropathy with multiple 3 mm erosions and stigmata of recent bleeding in the gastric antrum. -Colonoscopy in May 2019 for FOBT+stool was normal per records, no biopsies obtained.  -During her hospital stay in January 2022, she was found to be Hemoccult stool positive, but was deemed to be too unstable for any further endoscopy. Unable to have colonoscopy due to currently being on blood thinners.  -Patient continues on Plavix, but her Eliquis is currently being held due to concern for possible acute blood loss pending GI evaluation in the near future  4.  Other history -Family history: Sister with breast cancer/lung cancer, nephew with non-Hodgkin's lymphoma -Social history: Retired from work in Insurance claims handler.  Lives alone.  Lifelong non-smoker, no alcohol or illicit drugs. -Patient denies history of cancer, but her medical record does note that she has a history of breast cancer s/p right mastectomy -Other PMH: MI and stents x2 in 2021, NSTEMI in January 2022, CKD 4, congestive heart failure, atrial fibrillation  PLAN:  1.  Monoclonal gammopathy of unspecified significance -Work-up above confirms MGUS, but unable to exclude multiple myeloma until bone marrow biopsy obtained -Diagnosis and need for bone marrow biopsy discussed extensively with the patient during her visit today, but she reports that she needs time to think about it -Telephone visit scheduled next week to once again discuss bone marrow biopsy and hopefully move forward with work-up and treatment of potential multiple myeloma  2.  Multifactorial anemia -Patient will continue to receive IV iron infusions and Epogen  injections via Dr. Theador Hawthorne (nephrology) -Recommend bone marrow biopsy, as above  3.  Suspected chronic GI bleed -I have personally discussed the patient's case with her gastroenterology specialist (NP Roseanne Kaufman), who is working to try and see the patient within the next 1 to 2 weeks -Patient advised to continue to hold Eliquis until cleared by GI specialist   PLAN SUMMARY & DISPOSITION: -Phone visit next week to discuss decision regarding bone marrow biopsy  All questions were answered. The patient knows to call the clinic with any problems, questions or concerns.  Medical decision making: Moderate (acute problem undergoing work-up, interpretation of test results, discussion with outside provider)  Time spent on visit: I spent 25 minutes counseling the patient face to face. The total time spent in the appointment was 30 minutes and more than 50% was on counseling.   Harriett Rush, PA-C  05/08/20 9:12 AM

## 2020-05-08 NOTE — Patient Instructions (Signed)
[facility] Discharge Instructions  You were seen today by Rebekah Pennington PA-C for your anemia and your abnormal blood protein.  ANEMIA:  Your anemia may be due to bleeding from your intestines, as well as your chronic kidney disease.  STOP taking your Eliquis until you have been seen by the GI specialist and your cardiologist. Please call their offices for appointments and further instructions.  ABNORMAL PROTEIN:  The tests we checked last week confirm that you have an abnormal protein in your blood.  This is a condition called MGUS (Monoclonal Gammopathy of Undetermined Significance).   MGUS can progress into a type of cancer called multiple myeloma.  We do not know yet whether or not you have multiple myeloma.  We need to perform a bone marrow biopsy to see if you have cancer (multiple myeloma) or not.  This is an important step to see if you need treatment.    Your follow-up appointment will be a PHONE VISIT with Rebekah Pennington PA-C on Wednesday 05/16/2020 at 2:00 PM.  ---------------------------------------------------------------------------------------------  Thank you for choosing [facility] at [facility] to provide your oncology and hematology care.  To afford each patient quality time with our provider, please arrive at least 15 minutes before your scheduled appointment time.   If you have a lab appointment with the Cancer Center please come in thru the Main Entrance and check in at the main information desk.  You need to re-schedule your appointment should you arrive 10 or more minutes late.  We strive to give you quality time with our providers, and arriving late affects you and other patients whose appointments are after yours.  Also, if you no show three or more times for appointments you may be dismissed from the clinic at the providers discretion.     Again, thank you for choosing [facility].  Our hope  is that these requests will decrease the amount of time that you wait before being seen by our physicians.       _____________________________________________________________  Should you have questions after your visit to [facility], please contact our office at [phone] and follow the prompts.  Our office hours are 8:00 a.m. and 4:30 p.m. Monday - Friday.  Please note that voicemails left after 4:00 p.m. may not be returned until the following business day.  We are closed weekends and major holidays.  You do have access to a nurse 24-7, just call the main number to the clinic [phone] and do not press any options, hold on the line and a nurse will answer the phone.    For prescription refill requests, have your pharmacy contact our office and allow 72 hours.    Due to Covid, you will need to wear a mask upon entering the hospital. If you do not have a mask, a mask will be given to you at the Main Entrance upon arrival. For doctor visits, patients may have 1 support person age 18 or older with them. For treatment visits, patients can not have anyone with them due to social distancing guidelines and our immunocompromised population.      

## 2020-05-09 ENCOUNTER — Telehealth: Payer: Self-pay | Admitting: Family Medicine

## 2020-05-09 NOTE — Telephone Encounter (Signed)
Stated at her last hospitalization, her Lasix was changed to Torsemide 20mg  daily.  No weight gain.  No c/o SOB or chest pain.  She c/o swelling in her feet, ankles, & legs seem bigger today.  Does not typically go down at night.    She does have Echo & OV scheduled for May.

## 2020-05-09 NOTE — Telephone Encounter (Signed)
Patient called stating that she continues to have a lot of swelling to her feet,ankles and legs. States that when she was in the hospital all of her medications were changed.

## 2020-05-09 NOTE — Telephone Encounter (Signed)
She may be thinking about the UGI? That was in Jan 2022. She has not had a capsule.  She has known erosive gastropathy with history of bleeding from this in Sept 2021. She has stopped Eliquis but continues on Plavix for now. She was having black stool on iron, but we have had her stop oral iron and last black stool on last Friday.   Followed closely by Hem/Onc with MGUS, possible multiple myeloma but further work-up pending. Multifactorial anemia noted in setting of CKD, iron deficiency, suspected occult GI bleeding.   Hgb had been in the 9 range and dropped to 7 last week, receiving Feraheme and now 8.1.   Erline Levine, are there ANY available openings in the next 1-2 weeks with any APP? She is an urgent that needs to get in. If not, let me know  I see no choice but to make a spot on a Monday to see her urgently. Would she be able to come on 4/18 at 130?

## 2020-05-09 NOTE — Telephone Encounter (Signed)
Patient notified and verbalized understanding. 

## 2020-05-09 NOTE — Telephone Encounter (Signed)
Would double diuretic for two days then resume prior dose. May need to uptitrate standing dose eventually. Keep scheduled testing.

## 2020-05-10 NOTE — Telephone Encounter (Signed)
If still no openings in next 2 weeks, can we put her with me on Monday at 130?

## 2020-05-14 ENCOUNTER — Encounter: Payer: Self-pay | Admitting: Gastroenterology

## 2020-05-14 ENCOUNTER — Other Ambulatory Visit: Payer: Self-pay

## 2020-05-14 ENCOUNTER — Ambulatory Visit (INDEPENDENT_AMBULATORY_CARE_PROVIDER_SITE_OTHER): Payer: Medicare Other | Admitting: Gastroenterology

## 2020-05-14 VITALS — BP 129/50 | HR 60 | Temp 96.6°F | Ht 65.0 in | Wt 134.6 lb

## 2020-05-14 DIAGNOSIS — I2510 Atherosclerotic heart disease of native coronary artery without angina pectoris: Secondary | ICD-10-CM | POA: Diagnosis not present

## 2020-05-14 DIAGNOSIS — D649 Anemia, unspecified: Secondary | ICD-10-CM

## 2020-05-14 LAB — POCT HEMOGLOBIN-HEMACUE: Hemoglobin: 6.8 g/dL — CL (ref 12.0–15.0)

## 2020-05-14 NOTE — Patient Instructions (Signed)
Please go to the emergency room if you have black, tarry stools or significant fatigue.  We will be in touch with possible other options to investigate likely slow GI bleeding.  Please keep appointment with [place] in June!  If you start feeling like you could undergo the endoscopy with capsule placement, please let me know  I enjoyed seeing you again today! As you know, I value our relationship and want to provide genuine, compassionate, and quality care. I welcome your feedback. If you receive a survey regarding your visit,  I greatly appreciate you taking time to fill this out. See you next time!  Annitta Needs, PhD, ANP-BC [facility]

## 2020-05-14 NOTE — Progress Notes (Signed)
Referring Provider: Glenda Chroman, MD Primary Care Physician:  Glenda Chroman, MD Primary GI: Dr. Gala Romney   Chief Complaint  Patient presents with  . black stool    Stopped Iron and Eliquis last week. Stool no longer black     HPI:   Crystal [name] is an 86 y.o. female presenting today with a history of multifactorial normocytic anemia in setting of CKD, IDA component, dysphagia s/p EGD in Sept 2021 with non-obstructing Schatzki ring at GE junction, s/p dilation, erosive gastropathy with stigmata of recent bleeding, normal duodenum. Negative H.pylori.   Hematology reached out to [place] regarding drop in Hgb from 9 in Feb 2022 to 7 recently. Had been on Eliquis and Plavix (recent stent placement 12/21), with plans to hold Eliquis until further GI evaluation. Continuing Plavix. Has been receiving IV iron, epogen injections, concern for melena. However, she as also diagnosed with MGUS and concern for possible multiple myeloma, pending further evaluation.  We had her stop taking oral iron and Eliquis,  and the dark stool stopped.   When starting Eliquis a few months ago, she started seeing dark stool. Feels tired and weak. Having lower extremity edema, ankle edema. Doubled torsemide for 2 days and then returned to regular dosing. No significant improvement in pedal edema.   Does not feel she could swallow the capsule for GIVENS capsule study. Declining EGD with capsule placement at this time as she feels fatigued from multiple health issues and visits.      Past Medical History:  Diagnosis Date  . Anemia   . Bell's palsy   . Breast cancer ([place]) 1998   Right mastectomy  . CAD (coronary artery disease)    a. s/p STEMI in 01/2019 with DES to mid-LAD  . CHF (congestive heart failure) ([place])    a. EF 30-35% by echo in 01/2019  . Chronic kidney disease   . Essential hypertension   . GERD (gastroesophageal reflux disease)   . Gout   . History of skin cancer    Squamous cell, left  shoulder  . Mixed hyperlipidemia   . Osteopenia   . Thyroid nodule   . Type 2 diabetes mellitus ([place])     Past Surgical History:  Procedure Laterality Date  . BIOPSY  10/13/2019   Procedure: BIOPSY;  Surgeon: Daneil Dolin, MD;  [facility];  Service: Endoscopy;;  . CATARACT EXTRACTION  2016  . COLONOSCOPY  2019   Dr Anthony Sar  . CORONARY ANGIOGRAPHY N/A 01/16/2020   Procedure: CORONARY ANGIOGRAPHY;  Surgeon: Jettie Booze, MD;  Location: [facility];  Service: Cardiovascular;  Laterality: N/A;  . CORONARY STENT INTERVENTION N/A 01/16/2020   Procedure: CORONARY STENT INTERVENTION;  Surgeon: Jettie Booze, MD;  Location: [facility];  Service: Cardiovascular;  Laterality: N/A;  . CORONARY/GRAFT ACUTE MI REVASCULARIZATION N/A 01/30/2019   Procedure: Coronary/Graft Acute MI Revascularization;  Surgeon: Burnell Blanks, MD;  Location: [facility];  Service: Cardiovascular;  Laterality: N/A;  . ESOPHAGOGASTRODUODENOSCOPY (EGD) WITH PROPOFOL N/A 10/13/2019   Non-obstructing Schatzki ring at GE junction, s/p dilation, erosive gastropathy with stigmata of recent bleeding, normal duodenum. Negative H.pylori.   Marland Kitchen HYSTEROSCOPY    . INTRAVASCULAR ULTRASOUND/IVUS N/A 01/16/2020   Procedure: Intravascular Ultrasound/IVUS;  Surgeon: Jettie Booze, MD;  Location: [facility];  Service: Cardiovascular;  Laterality: N/A;  . LEFT HEART CATH AND CORONARY ANGIOGRAPHY N/A 01/30/2019   Procedure: LEFT HEART CATH  AND CORONARY ANGIOGRAPHY;  Surgeon: Burnell Blanks, MD;  Location: [facility];  Service: Cardiovascular;  Laterality: N/A;  . LEFT HEART CATH AND CORONARY ANGIOGRAPHY N/A 01/16/2020   Procedure: LEFT HEART CATH AND CORONARY ANGIOGRAPHY;  Surgeon: Jettie Booze, MD;  Location: [facility];  Service: Cardiovascular;  Laterality: N/A;  . Venia Minks DILATION N/A 10/13/2019   Procedure: Venia Minks DILATION;  Surgeon:  Daneil Dolin, MD;  [facility];  Service: Endoscopy;  Laterality: N/A;  . Right mastectomy  1998   [facility]  . RIGHT/LEFT HEART CATH AND CORONARY ANGIOGRAPHY N/A 02/13/2020   Procedure: RIGHT/LEFT HEART CATH AND CORONARY ANGIOGRAPHY;  Surgeon: Martinique, Peter M, MD;  Location: [facility];  Service: Cardiovascular;  Laterality: N/A;    Current Outpatient Medications  Medication Sig Dispense Refill  . acetaminophen (TYLENOL) 500 MG tablet Take 500 mg by mouth every 6 (six) hours as needed for headache (pain).    Marland Kitchen amiodarone (PACERONE) 200 MG tablet Take 200 mg by mouth daily.    Marland Kitchen amLODipine (NORVASC) 5 MG tablet Take 1 tablet (5 mg total) by mouth daily. 30 tablet 2  . apixaban (ELIQUIS) 2.5 MG TABS tablet Take 1 tablet (2.5 mg total) by mouth 2 (two) times daily. 60 tablet 0  . Calcium Carbonate-Vitamin D (CALCIUM 500 + D PO) Take 1 tablet by mouth daily before lunch. Citracal plus D3    . carvedilol (COREG) 25 MG tablet Take 1 tablet (25 mg total) by mouth in the morning and at bedtime. Breakfast & supper 180 tablet 3  . cholecalciferol (VITAMIN D3) 25 MCG (1000 UT) tablet Take 1,000 Units by mouth daily with breakfast.    . CINNAMON PO Take 1,000 mg by mouth daily before lunch.     . clopidogrel (PLAVIX) 75 MG tablet Take 1 tablet (75 mg total) by mouth daily. 90 tablet 3  . diazepam (VALIUM) 2 MG tablet Take 2 mg by mouth 2 (two) times daily as needed (dizzy spells.).    Marland Kitchen epoetin alfa (EPOGEN) 3000 UNIT/ML injection Inject into the skin.    . ferrous sulfate 325 (65 FE) MG tablet Take 325 mg by mouth daily before lunch.    . hydrALAZINE (APRESOLINE) 100 MG tablet TAKE 1 TABLET EVERY 8 HOURS 270 tablet 0  . isosorbide dinitrate (ISORDIL) 30 MG tablet Take by mouth.    . isosorbide mononitrate (IMDUR) 120 MG 24 hr tablet Take 1 tablet (120 mg total) by mouth at bedtime. 30 tablet 0  . loperamide (IMODIUM) 2 MG capsule Take 2-4 mg by mouth 4 (four) times daily as needed  for diarrhea or loose stools.    . meclizine (ANTIVERT) 25 MG tablet Take 25 mg by mouth 2 (two) times daily as needed for dizziness.    . Multiple Vitamin (MULTIVITAMIN WITH MINERALS) TABS tablet Take 1 tablet by mouth daily. Centrum Silver for Women    . multivitamin-lutein (OCUVITE-LUTEIN) CAPS capsule Take 1 capsule by mouth daily before lunch.     . nitroGLYCERIN (NITROSTAT) 0.4 MG SL tablet Place 1 tablet (0.4 mg total) under the tongue every 5 (five) minutes x 3 doses as needed for chest pain. 25 tablet 2  . Omega-3 Fatty Acids (FISH OIL PO) Take 1,576 mg by mouth daily before lunch.    . pantoprazole (PROTONIX) 40 MG tablet Take 1 tablet (40 mg total) by mouth daily. Take 30 minutes before breakfast 90 tablet 3  . potassium chloride SA (  KLOR-CON) 20 MEQ tablet Take 2 tablets (40 mEq total) by mouth daily. 60 tablet 2  . rosuvastatin (CRESTOR) 5 MG tablet Take 0.5 tablets (2.5 mg total) by mouth every other day. 30 tablet 0  . saccharomyces boulardii (FLORASTOR) 250 MG capsule Take 250 mg by mouth 2 (two) times daily as needed (diarrhea).     . sodium chloride (OCEAN) 0.65 % SOLN nasal spray Place 1 spray into both nostrils 2 (two) times daily as needed for congestion.     . torsemide (DEMADEX) 20 MG tablet Take 1 tablet (20 mg total) by mouth daily. 90 tablet 0   No current facility-administered medications for this visit.    Allergies as of 05/14/2020 - Review Complete 05/14/2020  Allergen Reaction Noted  . Bactrim [sulfamethoxazole-trimethoprim] Nausea And Vomiting 06/02/2017  . Sulfa antibiotics Nausea And Vomiting 06/02/2017  . Amlodipine Other (See Comments) 06/02/2017  . Clonidine derivatives Other (See Comments) 06/02/2017  . Evista [raloxifene hcl] Other (See Comments) 06/02/2017  . Fosamax [alendronate sodium] Other (See Comments) 06/02/2017  . Glipizide Other (See Comments) 06/02/2017  . Lipitor [atorvastatin calcium] Other (See Comments) 06/02/2017  . Losartan Other (See  Comments) 06/02/2017  . Pravastatin Other (See Comments) 06/02/2017  . Azithromycin Rash and Other (See Comments) 06/02/2017    Family History  Problem Relation Age of Onset  . Stroke Mother   . Heart attack Father   . Aneurysm Sister   . Heart attack Maternal Uncle   . Heart disease Maternal Uncle   . Heart attack Paternal Aunt   . Heart disease Paternal Aunt   . Heart attack Paternal Grandfather   . Heart disease Paternal Grandfather   . Heart attack Paternal Aunt   . Heart disease Paternal Aunt   . Heart attack Maternal Uncle   . Heart disease Maternal Uncle   . Heart attack Maternal Uncle   . Heart disease Maternal Uncle   . Heart attack Sister   . Heart disease Sister   . Cancer Sister   . Colon cancer Neg Hx   . Colon polyps Neg Hx     Social History   Socioeconomic History  . Marital status: Divorced    Spouse name: Not on file  . Number of children: Not on file  . Years of education: Not on file  . Highest education level: Not on file  Occupational History  . Not on file  Tobacco Use  . Smoking status: Never Smoker  . Smokeless tobacco: Never Used  Vaping Use  . Vaping Use: Never used  Substance and Sexual Activity  . Alcohol use: Never  . Drug use: Never  . Sexual activity: Not on file  Other Topics Concern  . Not on file  Social History Narrative  . Not on file   Social Determinants of Health   Financial Resource Strain: Low Risk   . Difficulty of Paying Living Expenses: Not hard at all  Food Insecurity: No Food Insecurity  . Worried About Charity fundraiser in the Last Year: Never true  . Ran Out of Food in the Last Year: Never true  Transportation Needs: No Transportation Needs  . Lack of Transportation (Medical): No  . Lack of Transportation (Non-Medical): No  Physical Activity: Insufficiently Active  . Days of Exercise per Week: 1 day  . Minutes of Exercise per Session: 20 min  Stress: No Stress Concern Present  . Feeling of Stress :  Not at all  Social Connections: Moderately Isolated  .  Frequency of Communication with Friends and Family: More than three times a week  . Frequency of Social Gatherings with Friends and Family: More than three times a week  . Attends Religious Services: 1 to 4 times per year  . Active Member of Clubs or Organizations: No  . Attends Archivist Meetings: Never  . Marital Status: Divorced    Review of Systems: Gen: Denies fever, chills, anorexia. Denies fatigue, weakness, weight loss.  CV: Denies chest pain, palpitations, syncope, peripheral edema, and claudication. Resp: Denies dyspnea at rest, cough, wheezing, coughing up blood, and pleurisy. GI: see HPI Derm: Denies rash, itching, dry skin Psych: Denies depression, anxiety, memory loss, confusion. No homicidal or suicidal ideation.  Heme: see HPI  Physical Exam: BP (!) 129/50   Pulse 60   Temp (!) 96.6 F (35.9 C) (Temporal)   Ht 5' 5"  (1.651 m)   Wt 134 lb 9.6 oz (61.1 kg)   BMI 22.40 kg/m  General:   Alert and oriented. Frail, no distress.  Head:  Normocephalic and atraumatic. Eyes:  Conjuctiva clear without scleral icterus. Mouth:  Mask in place Abdomen:  +BS, soft, non-tender and non-distended. No rebound or guarding. No HSM or masses noted. Msk:  Symmetrical without gross deformities. Normal posture. Extremities:  Without edema. Neurologic:  Alert and  oriented x4 Psych:  Alert and cooperative. Normal mood and affect.  ASSESSMENT/PLAN: Crystal [name] is an 86 y.o. female presenting today as an urgent work-in with a history of multifactorial normocytic anemia in setting of CKD, IDA component, dysphagia s/p EGD in Sept 2021 with non-obstructing Schatzki ring at GE junction, s/p dilation, erosive gastropathy with stigmata of recent bleeding, normal duodenum. Negative H.pylori. Now with drop in Hgb while on Eliquis and Plavix, with Eliquis starting several months ago. Concern for melena per patient, although she  has been on iron. However, black stool was not noted previously when on iron until beginning Eliquis.    Multifactorial anemia in setting of CKD, IDA, MGUS and concern for possible multiple myeloma. Followed closely by Hematology with IV iron, epogen.   Currently, Eliquis is on hold along with iron. No melena in 1 week, and Hgb 8.1 recently.   She does have history of erosive gastropathy with bleeding Sept 2021. Colonoscopy fairly up-to-date by Dr. Anthony Sar in 2019. We discussed EGD with capsule placement, as she feels unable to swallow capsule on own. She is wanting to hold off on this currently and be followed with serial evaluations. As she is declining endoscopic evaluation, options are limited but could consider dedicated small bowel imaging (e.g. CTE) to exclude any overt small bowel mass or process that could be contributing but would not be helpful with identifying small lesions, erosions, ulcerations, etc.   Appreciate close follow-up by Hematology. For now, continue with supportive measures. I am discussing with Dr. Gala Romney non-invasive small bowel imaging. Eliquis remains on hold currently.   Annitta Needs, PhD, ANP-BC [facility]

## 2020-05-15 ENCOUNTER — Encounter (HOSPITAL_COMMUNITY): Payer: Self-pay | Admitting: General Practice

## 2020-05-15 NOTE — Telephone Encounter (Addendum)
Reports swelling in both legs feet and ankles. Reports swelling is not bad in the mornings when she gets up but worsens later in the day. Denies sob, chest pain or dizziness. Reports that she weighs daily and says no increase in weight. Reports recent increase in torsemide for 2 days did not help swelling. BP today 123/55 & HR 63 & Pulse ox 95%. Medications reviewed. Appointment given to patient to be seen on 05/17/2020 by Jarrett Soho.

## 2020-05-15 NOTE — Progress Notes (Signed)
[place] Work  Initial Assessment   Crystal [name] is a 86 y.o. year old female contacted by phone. Clinical Social Work was referred by Billey Co PA for assessment of psychosocial needs.   SDOH (Social Determinants of Health) assessments performed: Yes   Distress Screen completed: Yes ONCBCN DISTRESS SCREENING 05/01/2020  Screening Type Initial Screening  Distress experienced in past week (1-10) 2  Information Concerns Type Lack of info about diagnosis  Physician notified of physical symptoms Yes  Referral to clinical psychology No  Referral to clinical social work No  Referral to dietition No  Referral to financial advocate No  Referral to support programs No  Referral to palliative care No      Family/Social Information:  . Housing Arrangement: patient lives alone; lived w nephew until two weeks ago; is making progress in dealing with chores, food prep.  Values her independence and wants to stay in her home.  Family has helped w minor home repairs needed for her to stay at home. "I have always been independent." . Family members/support persons in your life? Nephew, nieces, other family members, neighbors, "I have a wonderful family" . Transportation concerns: Nephew drives her to appointments when she cannot drive herself.  . Employment: Retired. .  Income source: Retirement income, Medicare, BCBS supplement . Financial concerns: No o Type of concern: None . Food access concerns: Niece or nephew do food shopping, "I have a [employer] right down the road." Fixes her own meals, declines [employer] referral.  . Religious or spiritual practice: Is [employer], had started returning to church post COVID, "then I got sick again, was hospitalized."  Has not been back since then.   . Medication Concerns: Has signficant foot;/ankle swelling, medications were changed while in hospital, she wonders if this is the reason for the swelling . Services Currently in place:   None at this time; "visiting nurse came for 3 visits", had [facility] in the past.  Has [facility] care management, Deloria Lair NP will be seeing her by video visit.    Coping/ Adjustment to diagnosis: . Patient understands treatment plan and what happens next? Multiple health concerns have led to period of declining health, "Im down right now, but I will get back up."  Had "heart attack in December."  Felt "good" after first heart attack, then had second heart attack.   . Concerns about diagnosis and/or treatment: I'm not especially worried about anything . Patient reported stressors: none at this time . Hopes and priorities: wants to remain in her own home, likes to live independently; her major concern is swelling of feet and ankles . Patient enjoys watching TV, time with family/ friends and computer games, does not go outside due to cold temperatures . Current coping skills/ strengths: Capable of independent living, Communication skills, Motivation for treatment/growth and Supportive family/friends    SUMMARY: Current SDOH Barriers:  . None  Clinical Social Work Clinical Goal(s):  Marland Kitchen No intervention needed at this time  Interventions: . Discussed common feeling and emotions when being diagnosed with cancer, and the importance of support during treatment . Informed patient of the support team roles and support services at [facility] . Provided CSW contact information and encouraged patient to call with any questions or concerns . Provided patient with information about options for food delivery including [employer], patient declined   Follow Up Plan: Patient will contact CSW with any support or resource needs Patient verbalizes understanding of plan: Yes  Beverely Pace , [place], LCSW Clinical Social Worker Phone:  [phone]

## 2020-05-15 NOTE — Telephone Encounter (Signed)
Pt called stating she's still having swelling even with the medication changes.   Please call [phone]

## 2020-05-16 ENCOUNTER — Inpatient Hospital Stay (HOSPITAL_BASED_OUTPATIENT_CLINIC_OR_DEPARTMENT_OTHER): Payer: Medicare Other | Admitting: Physician Assistant

## 2020-05-16 ENCOUNTER — Other Ambulatory Visit: Payer: Self-pay

## 2020-05-16 ENCOUNTER — Ambulatory Visit (HOSPITAL_COMMUNITY): Payer: Medicare Other | Admitting: Physician Assistant

## 2020-05-16 DIAGNOSIS — D472 Monoclonal gammopathy: Secondary | ICD-10-CM

## 2020-05-16 DIAGNOSIS — D500 Iron deficiency anemia secondary to blood loss (chronic): Secondary | ICD-10-CM

## 2020-05-16 NOTE — Progress Notes (Signed)
Virtual Visit via Telephone Note [facility]  I connected with Crystal [name]  on 05/16/20  at  2:49 PM  by telephone and verified that I am speaking with the correct person using two identifiers.  Location: Patient: Home Provider: [facility]   I discussed the limitations, risks, security and privacy concerns of performing an evaluation and management service by telephone and the availability of in person appointments. I also discussed with the patient that there may be a patient responsible charge related to this service. The patient expressed understanding and agreed to proceed.   History of Present Illness: Crystal [name] was contacted today via telephone for follow-up of her anemia and MGUS, particularly with discussion of whether or not she would like to proceed with bone marrow biopsy.  She was last seen in person on 05/08/2020.  Regarding her anemia and possible GI bleed, she reports that her melena and black stool has not recurred ever since she stopped taking her Eliquis and her iron pills 2 weeks ago.  She did follow-up with gastroenterology (the NP Crystal [name]) earlier this week on Monday, 05/14/2020.  Patient complains of some persistent feet and ankle swelling, but denies any shortness of breath or chest pain.  She is scheduled to see her cardiologist tomorrow.  She complains of persistent fatigue, reports her energy is about 25%.  She was able to take a quick trip to the store yesterday for the first time in many months to pick up a jug of milk.  She reports 75% appetite, states that she is maintaining a stable weight.  No additional issues or complaints at this time.    Observations/Objective: Review of Systems  Constitutional: Positive for malaise/fatigue. Negative for chills, diaphoresis, fever and weight loss.  Respiratory: Negative for shortness of breath.   Cardiovascular: Positive for leg swelling. Negative for chest pain and palpitations.   Gastrointestinal: Negative for abdominal pain, blood in stool, nausea and vomiting.     PHYSICAL EXAM (per limitations of virtual telephone visit): The patient is alert and oriented x 3, exhibiting adequate mentation, good mood, and ability to speak in full sentences and execute sound judgement.   Assessment: 1. Monoclonal gammopathy of unknown significance - IgG kappa -Referred by Dr. Theador Hawthorne for monoclonal protein detected during work-up for CKD stage IV -Work-up from 05/02/2020 confirms MGUS (versus multiple myeloma, pending further work-up) with 0.4% M spike on SPEP, IFE showing IgG monoclonal protein with kappa light chain specificity; normal free light chain ratio 1.48, but with elevated kappa 76.6, and elevated lambda 51.7; beta-2 microglobulin elevated at 7.8 -Skeletal survey (05/02/2020): Diffuse osteopenia with several subtle small lucencies noted in the skull and questionable subtle lucency noted on the right and left humeri, myeloma cannot be excluded per radiology report -Patient does have low hemoglobin and elevated creatinine, but this may be due to other factors - further for myeloma is in progress, as below -Patient reports significant fatigue, but otherwise is asymptomatic - Recommended bone marrow biopsy due to findings on skeletal survey, patient has declined at this time, would like to wait and decide after we recheck her labs in 3 months  2. Multifactorial anemia -Labs on 05/02/2020 show hemoglobin 7.0, repeat CBC on 05/08/2020 improved to hemoglobin 8.1 -Likely multifactorial in the setting of CKD 4, functional iron deficiency, and suspected chronic GI bleed - patient also under work-up for possible multiple myeloma as above -Ferritinelevated at 553, serum iron 43, low TIBC 226, iron saturation 19%. -  Normal B12, folate, methylmalonic acid -Dr. Theador Hawthorne has started the patient on IV iron fusion and Epogen injections. -Patient was previously on oral iron supplement, but this  has been discontinued due to lack of efficacy and malabsorption -She has never had a blood transfusion.  3. Suspected chronic GI bleed -Patient reports a previous history of GI bleeds,has had intermittent black tarry stools ever since she was started on blood thinners -EGD in September 2021 revealed erosive gastropathy with multiple 3 mm erosions and stigmata of recent bleeding in the gastric antrum. -Colonoscopy in May 2019 for FOBT+stool was normal per records, no biopsies obtained. -During her hospital stay in January 2022, she was found to be Hemoccult stool positive, but was deemed to be too unstable for any further endoscopy. Unable to have colonoscopy due to currently being on blood thinners. -Patient continues on Plavix, but her Eliquis is currently being held due to concern for possible acute blood loss pending GI evaluation in the near future  4. Other history -Family history: Sister with breast cancer/lung cancer, nephew with non-Hodgkin's lymphoma -Social history: Retired from work in IT trainer. Lives alone. Lifelong non-smoker, no alcohol or illicit drugs. -Patient denies history of cancer, but her medical record does note that she has a history of breast cancer s/p right mastectomy -Other PMH: MI and stents x2 in 2021, NSTEMI in January 2022, CKD 4, congestive heart failure, atrial fibrillation   Plan: 1. Monoclonal gammopathy of unknown significance -Work-up above confirms MGUS, but unable to exclude multiple myeloma until bone marrow biopsy obtained -Diagnosis and need for bone marrow biopsy discussed extensively with the patient during her visit today, but she declines at this time -we will reconsider after repeat myeloma panel in 3 months - Repeat MGUS/myeloma panel in 3 months - RTC in 3 months to discuss results - We will repeat skeletal survey in 6 months (rather than 12 months) due to abnormal findings  2. Multifactorial  anemia -Patient will continue to receive IV iron infusions and Epogen injections via Dr. Theador Hawthorne (nephrology) -Recommend bone marrow biopsy, as above - patient refused - Repeat CBC, iron/TIBC, ferritin in 3 months  3. Suspected chronic GI bleed -No melena over the past 2 weeks, since stopping Eliquis and iron supplement -Patient advised to continue to hold Eliquis until cleared by GI specialist and cardiologist   Follow Up Instructions: - Repeat MGUS/myeloma panel in 3 months - RTC in 3 months to discuss results with need for possible biopsy - Repeat skeletal survey in 6 months    I discussed the assessment and treatment plan with the patient. The patient was provided an opportunity to ask questions and all were answered. The patient agreed with the plan and demonstrated an understanding of the instructions.   The patient was advised to call back or seek an in-person evaluation if the symptoms worsen or if the condition fails to improve as anticipated.  I provided 15 minutes of non-face-to-face time during this encounter.   Harriett Rush, PA-C

## 2020-05-17 ENCOUNTER — Encounter: Payer: Self-pay | Admitting: Cardiology

## 2020-05-17 ENCOUNTER — Ambulatory Visit (INDEPENDENT_AMBULATORY_CARE_PROVIDER_SITE_OTHER): Payer: Medicare Other | Admitting: Cardiology

## 2020-05-17 VITALS — BP 152/50 | HR 50 | Ht 65.0 in | Wt 134.0 lb

## 2020-05-17 DIAGNOSIS — R600 Localized edema: Secondary | ICD-10-CM

## 2020-05-17 DIAGNOSIS — I2510 Atherosclerotic heart disease of native coronary artery without angina pectoris: Secondary | ICD-10-CM

## 2020-05-17 DIAGNOSIS — I5042 Chronic combined systolic (congestive) and diastolic (congestive) heart failure: Secondary | ICD-10-CM

## 2020-05-17 DIAGNOSIS — Z79899 Other long term (current) drug therapy: Secondary | ICD-10-CM

## 2020-05-17 DIAGNOSIS — I25119 Atherosclerotic heart disease of native coronary artery with unspecified angina pectoris: Secondary | ICD-10-CM

## 2020-05-17 DIAGNOSIS — N1832 Chronic kidney disease, stage 3b: Secondary | ICD-10-CM | POA: Diagnosis not present

## 2020-05-17 MED ORDER — TORSEMIDE 20 MG PO TABS: 20.0000 mg | ORAL_TABLET | Freq: Every day | ORAL | 1 refills | Status: DC

## 2020-05-17 NOTE — Progress Notes (Signed)
Cardiology Office Note  Date: 05/17/2020   ID: [name], DOB Dec 11, 1934, MRN [id number]  PCP:  Glenda Chroman, MD  Cardiologist:  Rozann Lesches, MD Electrophysiologist:  None   Chief Complaint  Patient presents with  . Cardiac follow-up    History of Present Illness: KAISLEE CHAO is an 86 y.o. female last assessed via telehealth encounter by Mr. Leonides Sake NP in February.  She presents to the office today to discuss intermittent leg swelling.  She states that her leg swelling has been more consistent recently, not responding well to Demadex 20 mg daily.  Her weight is up 3 pounds from earlier in the month.  Otherwise, she reports no angina symptoms or significant palpitations.  Cardiac catheterization results from January are outlined below.  Echocardiogram at that time revealed LVEF 40 to 45% range with moderate diastolic dysfunction, normal RV contraction with estimated RVSP 48 mmHg.  I reviewed her medications which are outlined below.  She is scheduled for a follow-up echocardiogram in late May with office visit thereafter.  Past Medical History:  Diagnosis Date  . Anemia   . Bell's palsy   . Breast cancer ([place]) 1998   Right mastectomy  . CAD (coronary artery disease)    a. s/p STEMI in 01/2019 with DES to mid-LAD  . CHF (congestive heart failure) ([place])    a. EF 30-35% by echo in 01/2019  . Chronic kidney disease   . Essential hypertension   . GERD (gastroesophageal reflux disease)   . Gout   . History of skin cancer    Squamous cell, left shoulder  . Mixed hyperlipidemia   . Osteopenia   . Paroxysmal atrial fibrillation ([facility])   . Thyroid nodule   . Type 2 diabetes mellitus ([place])     Past Surgical History:  Procedure Laterality Date  . BIOPSY  10/13/2019   Procedure: BIOPSY;  Surgeon: Daneil Dolin, MD;  [facility];  Service: Endoscopy;;  . CATARACT EXTRACTION  2016  . COLONOSCOPY  2019   Dr Anthony Sar  . CORONARY ANGIOGRAPHY N/A  01/16/2020   Procedure: CORONARY ANGIOGRAPHY;  Surgeon: Jettie Booze, MD;  Location: [place] [facility];  Service: Cardiovascular;  Laterality: N/A;  . CORONARY STENT INTERVENTION N/A 01/16/2020   Procedure: CORONARY STENT INTERVENTION;  Surgeon: Jettie Booze, MD;  Location: [place] [facility];  Service: Cardiovascular;  Laterality: N/A;  . CORONARY/GRAFT ACUTE MI REVASCULARIZATION N/A 01/30/2019   Procedure: Coronary/Graft Acute MI Revascularization;  Surgeon: Burnell Blanks, MD;  Location: [place] [facility];  Service: Cardiovascular;  Laterality: N/A;  . ESOPHAGOGASTRODUODENOSCOPY (EGD) WITH PROPOFOL N/A 10/13/2019   Non-obstructing Schatzki ring at GE junction, s/p dilation, erosive gastropathy with stigmata of recent bleeding, normal duodenum. Negative H.pylori.   Marland Kitchen HYSTEROSCOPY    . INTRAVASCULAR ULTRASOUND/IVUS N/A 01/16/2020   Procedure: Intravascular Ultrasound/IVUS;  Surgeon: Jettie Booze, MD;  Location: [place] [facility];  Service: Cardiovascular;  Laterality: N/A;  . LEFT HEART CATH AND CORONARY ANGIOGRAPHY N/A 01/30/2019   Procedure: LEFT HEART CATH AND CORONARY ANGIOGRAPHY;  Surgeon: Burnell Blanks, MD;  Location: [place] [facility];  Service: Cardiovascular;  Laterality: N/A;  . LEFT HEART CATH AND CORONARY ANGIOGRAPHY N/A 01/16/2020   Procedure: LEFT HEART CATH AND CORONARY ANGIOGRAPHY;  Surgeon: Jettie Booze, MD;  Location: [place] [facility];  Service: Cardiovascular;  Laterality: N/A;  . MALONEY DILATION N/A 10/13/2019   Procedure: Venia Minks DILATION;  Surgeon:  Rourk, Cristopher Estimable, MD;  [facility];  Service: Endoscopy;  Laterality: N/A;  . Right mastectomy  1998   [place]  . RIGHT/LEFT HEART CATH AND CORONARY ANGIOGRAPHY N/A 02/13/2020   Procedure: RIGHT/LEFT HEART CATH AND CORONARY ANGIOGRAPHY;  Surgeon: Martinique, Peter M, MD;  Location: [place] [facility];  Service: Cardiovascular;  Laterality: N/A;    Current Outpatient  Medications  Medication Sig Dispense Refill  . acetaminophen (TYLENOL) 500 MG tablet Take 500 mg by mouth every 6 (six) hours as needed for headache (pain).    Marland Kitchen amiodarone (PACERONE) 200 MG tablet Take 200 mg by mouth daily.    Marland Kitchen amLODipine (NORVASC) 5 MG tablet Take 1 tablet (5 mg total) by mouth daily. 30 tablet 2  . Calcium Carbonate-Vitamin D (CALCIUM 500 + D PO) Take 1 tablet by mouth daily before lunch. Citracal plus D3    . carvedilol (COREG) 25 MG tablet Take 1 tablet (25 mg total) by mouth in the morning and at bedtime. Breakfast & supper 180 tablet 3  . cholecalciferol (VITAMIN D3) 25 MCG (1000 UT) tablet Take 1,000 Units by mouth daily with breakfast.    . CINNAMON PO Take 1,000 mg by mouth daily before lunch.     . clopidogrel (PLAVIX) 75 MG tablet Take 1 tablet (75 mg total) by mouth daily. 90 tablet 3  . diazepam (VALIUM) 2 MG tablet Take 2 mg by mouth 2 (two) times daily as needed (dizzy spells.).    Marland Kitchen epoetin alfa (EPOGEN) 3000 UNIT/ML injection Inject into the skin.    . hydrALAZINE (APRESOLINE) 100 MG tablet TAKE 1 TABLET EVERY 8 HOURS 270 tablet 0  . isosorbide mononitrate (IMDUR) 120 MG 24 hr tablet Take 1 tablet (120 mg total) by mouth at bedtime. 30 tablet 0  . loperamide (IMODIUM) 2 MG capsule Take 2-4 mg by mouth 4 (four) times daily as needed for diarrhea or loose stools.    . meclizine (ANTIVERT) 25 MG tablet Take 25 mg by mouth 2 (two) times daily as needed for dizziness.    . Multiple Vitamin (MULTIVITAMIN WITH MINERALS) TABS tablet Take 1 tablet by mouth daily. Centrum Silver for Women    . multivitamin-lutein (OCUVITE-LUTEIN) CAPS capsule Take 1 capsule by mouth daily before lunch.     . nitroGLYCERIN (NITROSTAT) 0.4 MG SL tablet Place 1 tablet (0.4 mg total) under the tongue every 5 (five) minutes x 3 doses as needed for chest pain. 25 tablet 2  . Omega-3 Fatty Acids (FISH OIL PO) Take 1,576 mg by mouth daily before lunch.    . pantoprazole (PROTONIX) 40 MG tablet  Take 1 tablet (40 mg total) by mouth daily. Take 30 minutes before breakfast 90 tablet 3  . potassium chloride SA (KLOR-CON) 20 MEQ tablet Take 2 tablets (40 mEq total) by mouth daily. 60 tablet 2  . rosuvastatin (CRESTOR) 5 MG tablet Take 0.5 tablets (2.5 mg total) by mouth every other day. 30 tablet 0  . saccharomyces boulardii (FLORASTOR) 250 MG capsule Take 250 mg by mouth 2 (two) times daily as needed (diarrhea).     . sodium chloride (OCEAN) 0.65 % SOLN nasal spray Place 1 spray into both nostrils 2 (two) times daily as needed for congestion.     Marland Kitchen apixaban (ELIQUIS) 2.5 MG TABS tablet Take 1 tablet (2.5 mg total) by mouth 2 (two) times daily. (Patient not taking: Reported on 05/17/2020) 60 tablet 0  . ferrous sulfate 325 (65 FE) MG tablet Take  325 mg by mouth daily before lunch. (Patient not taking: Reported on 05/17/2020)    . [START ON 05/21/2020] torsemide (DEMADEX) 20 MG tablet Take 1-2 tablets (20-40 mg total) by mouth daily. Alternate 20 mg with 40 mg every other day 135 tablet 1   No current facility-administered medications for this visit.   Allergies:  Bactrim [sulfamethoxazole-trimethoprim], Sulfa antibiotics, Amlodipine, Clonidine derivatives, Evista [raloxifene hcl], Fosamax [alendronate sodium], Glipizide, Lipitor [atorvastatin calcium], Losartan, Pravastatin, and Azithromycin   ROS: No syncope.  Physical Exam: VS:  BP (!) 152/50   Pulse (!) 50   Ht 5\' 5"  (1.651 m)   Wt 134 lb (60.8 kg)   SpO2 96%   BMI 22.30 kg/m , BMI Body mass index is 22.3 kg/m.  Wt Readings from Last 3 Encounters:  05/17/20 134 lb (60.8 kg)  05/14/20 134 lb 9.6 oz (61.1 kg)  05/08/20 131 lb 4.8 oz (59.6 kg)    General: Elderly woman, appears comfortable at rest. HEENT: Conjunctiva and lids normal, wearing a mask. Neck: Supple, no elevated JVP or carotid bruits, no thyromegaly. Lungs: Decreased breath sounds at the bases, nonlabored breathing at rest. Cardiac: Regular rate and rhythm, no S3, 2/6  systolic murmur, no pericardial rub. Extremities: 2+ lower extremity edema.  ECG:  An ECG dated 02/24/2020 was personally reviewed today and demonstrated:  Atrial fibrillation with RVR, nonspecific T wave changes.  Recent Labwork: 02/14/2020: TSH 2.238 02/23/2020: B Natriuretic Peptide 3,507.5 02/29/2020: ALT 31; AST 20 03/02/2020: Magnesium 1.7 03/04/2020: BUN 42; Creatinine, Ser 2.02; Potassium 4.0; Sodium 135 05/08/2020: Hemoglobin 8.1; Platelets 356     Component Value Date/Time   CHOL 155 02/14/2020 0339   TRIG 74 02/14/2020 0339   HDL 57 02/14/2020 0339   CHOLHDL 2.7 02/14/2020 0339   VLDL 15 02/14/2020 0339   LDLCALC 83 02/14/2020 0339    Other Studies Reviewed Today:  Cardiac catheterization 02/13/2020:  Non-stenotic Mid LAD lesion was previously treated.  Prox Cx to Mid Cx lesion is 20% stenosed.  Dist RCA lesion is 20% stenosed.  Non-stenotic Mid RCA lesion was previously treated.  Hemodynamic findings consistent with mild pulmonary hypertension.  LV end diastolic pressure is mildly elevated.   1. Nonobstructive CAD. Continued excellent patency of stents in the LAD and RCA 2. Mildly elevated LV filling pressures 3. Mildly elevated right heart pressures. 4. Normal cardiac output  Echocardiogram 02/17/2020: 1. Compared with the echo 89/3810, systolic function is worse and the  pleural effusion is new.  2. Hypokinesis of the basal to mid anteroseptal, inferoseptal and  inferior myocardium. Left ventricular ejection fraction, by estimation, is  40 to 45%. The left ventricle has mildly decreased function. The left  ventricle demonstrates regional wall  motion abnormalities (see scoring diagram/findings for description). Left  ventricular diastolic parameters are consistent with Grade II diastolic  dysfunction (pseudonormalization). Elevated left ventricular end-diastolic  pressure.  3. Moderate pleural effusion in the left lateral region.  4. Mild to moderate  mitral valve regurgitation.  5. Tricuspid valve regurgitation is moderate.  6. The aortic valve is tricuspid. Aortic valve regurgitation is mild.  7. There is moderately elevated pulmonary artery systolic pressure.  8. The inferior vena cava is dilated in size with <50% respiratory  variability, suggesting right atrial pressure of 15 mmHg.   Assessment and Plan:  1.  Bilateral lower extremity edema, more persistent and in the setting of ischemic cardiomyopathy and CKD stage IIIb.  Plan to increase Demadex to 40 mg daily through the weekend  and then change to 20 mg alternating with 40 mg every other day.  Follow-up BMET in 2 weeks.  Continue potassium supplement.  2.  Ischemic cardiomyopathy, LVEF 40 to 45% by last assessment in January.  She has a follow-up echocardiogram scheduled for late May.  Diuretics are being uptitrated as discussed above.  Not on ARB/ANRI/Aldactone with history of acute on chronic renal insufficiency.  Continue Norvasc, Coreg, hydralazine, and Imdur.  3.  Paroxysmal atrial fibrillation, no significant palpitations.  She is on amiodarone and Coreg, also Eliquis for stroke prophylaxis.  4.  Acquired thrombophilia, on Eliquis for stroke prophylaxis.  CHA2DS2-VASc score is 7.  No reported spontaneous bleeding problems.  5.  CKD stage IIIb, last creatinine 2.02 with potassium normal.  6.  CAD status post DES to the mid LAD in January 2021, DES to the mid RCA in December 2021, both patent at angiography in January 2022.  Medication Adjustments/Labs and Tests Ordered: Current medicines are reviewed at length with the patient today.  Concerns regarding medicines are outlined above.   Tests Ordered: Orders Placed This Encounter  Procedures  . Basic metabolic panel    Medication Changes: Meds ordered this encounter  Medications  . torsemide (DEMADEX) 20 MG tablet    Sig: Take 1-2 tablets (20-40 mg total) by mouth daily. Alternate 20 mg with 40 mg every other day     Dispense:  135 tablet    Refill:  1    05/17/2020 dose increase    Disposition:  Follow up in May as scheduled.  Signed, Satira Sark, MD, [facility] 05/17/2020 2:43 PM    [facility] [place], [place], [place] [address] Phone: [phone]; Fax: [phone]

## 2020-05-17 NOTE — Patient Instructions (Addendum)
Medication Instructions:   Your physician has recommended you make the following change in your medication:   Take torsemide 40 mg daily through Sunday 05/20/20.   05/21/20, start alternating torsemide 20 mg with 40 mg every other day  Continue other medications the same  Labwork:  Your physician recommends that you return for lab work in: 2 weeks to check your BMET. This may be done at [facility]. No appointment needed.  Testing/Procedures:  none  Follow-Up:  Your physician recommends that you schedule a follow-up appointment in: May as planned.  Any Other Special Instructions Will Be Listed Below (If Applicable).  If you need a refill on your cardiac medications before your next appointment, please call your pharmacy.

## 2020-05-18 ENCOUNTER — Other Ambulatory Visit: Payer: Self-pay | Admitting: *Deleted

## 2020-05-18 ENCOUNTER — Other Ambulatory Visit: Payer: Self-pay

## 2020-05-18 NOTE — Patient Outreach (Signed)
[facility]) Care Management  [facility] Care Manager  05/18/2020   Christeena Krogh [place] Jan 19, 1935 [id number]  Subjective: Telephone outreach for f/u multiple co-morbidities  Encounter Medications:  Outpatient Encounter Medications as of 05/18/2020  Medication Sig Note  . acetaminophen (TYLENOL) 500 MG tablet Take 500 mg by mouth every 6 (six) hours as needed for headache (pain).   Marland Kitchen amiodarone (PACERONE) 200 MG tablet Take 200 mg by mouth daily.   Marland Kitchen amLODipine (NORVASC) 5 MG tablet Take 1 tablet (5 mg total) by mouth daily.   Marland Kitchen apixaban (ELIQUIS) 2.5 MG TABS tablet Take 1 tablet (2.5 mg total) by mouth 2 (two) times daily. 05/18/2020: PT INSTRUCTED BY DR. MCDOWELL BY HER VISIT SUMMARY TO RESTART AND HAS RESTARTING ELIQUIS 05/18/20.  . Calcium Carbonate-Vitamin D (CALCIUM 500 + D PO) Take 1 tablet by mouth daily before lunch. Citracal plus D3   . carvedilol (COREG) 25 MG tablet Take 1 tablet (25 mg total) by mouth in the morning and at bedtime. Breakfast & supper   . cholecalciferol (VITAMIN D3) 25 MCG (1000 UT) tablet Take 1,000 Units by mouth daily with breakfast.   . CINNAMON PO Take 1,000 mg by mouth daily before lunch.    . clopidogrel (PLAVIX) 75 MG tablet Take 1 tablet (75 mg total) by mouth daily.   . diazepam (VALIUM) 2 MG tablet Take 2 mg by mouth 2 (two) times daily as needed (dizzy spells.).   Marland Kitchen epoetin alfa (EPOGEN) 3000 UNIT/ML injection Inject into the skin.   . ferrous sulfate 325 (65 FE) MG tablet Take 325 mg by mouth daily before lunch. 05/18/2020: HAS RESTARTED ON 05/18/20  . hydrALAZINE (APRESOLINE) 100 MG tablet TAKE 1 TABLET EVERY 8 HOURS   . isosorbide mononitrate (IMDUR) 120 MG 24 hr tablet Take 1 tablet (120 mg total) by mouth at bedtime.   . meclizine (ANTIVERT) 25 MG tablet Take 25 mg by mouth 2 (two) times daily as needed for dizziness.   . Multiple Vitamin (MULTIVITAMIN WITH MINERALS) TABS tablet Take 1 tablet by mouth daily. Centrum Silver for Women   .  multivitamin-lutein (OCUVITE-LUTEIN) CAPS capsule Take 1 capsule by mouth daily before lunch.    . Omega-3 Fatty Acids (FISH OIL PO) Take 1,576 mg by mouth daily before lunch.   . pantoprazole (PROTONIX) 40 MG tablet Take 1 tablet (40 mg total) by mouth daily. Take 30 minutes before breakfast   . potassium chloride SA (KLOR-CON) 20 MEQ tablet Take 2 tablets (40 mEq total) by mouth daily.   . rosuvastatin (CRESTOR) 5 MG tablet Take 0.5 tablets (2.5 mg total) by mouth every other day.   . sodium chloride (OCEAN) 0.65 % SOLN nasal spray Place 1 spray into both nostrils 2 (two) times daily as needed for congestion.    Derrill Memo ON 05/21/2020] torsemide (DEMADEX) 20 MG tablet Take 1-2 tablets (20-40 mg total) by mouth daily. Alternate 20 mg with 40 mg every other day   . loperamide (IMODIUM) 2 MG capsule Take 2-4 mg by mouth 4 (four) times daily as needed for diarrhea or loose stools. (Patient not taking: Reported on 05/18/2020)   . nitroGLYCERIN (NITROSTAT) 0.4 MG SL tablet Place 1 tablet (0.4 mg total) under the tongue every 5 (five) minutes x 3 doses as needed for chest pain.   Marland Kitchen saccharomyces boulardii (FLORASTOR) 250 MG capsule Take 250 mg by mouth 2 (two) times daily as needed (diarrhea).  (Patient not taking: Reported on 05/18/2020)    No  facility-administered encounter medications on file as of 05/18/2020.    Functional Status:  In your present state of health, do you have any difficulty performing the following activities: 04/12/2020 02/13/2020  Hearing? - N  Vision? - N  Difficulty concentrating or making decisions? - N  Walking or climbing stairs? - Y  Dressing or bathing? - N  Doing errands, shopping? - Y  Preparing Food and eating ? N -  Comment Late entry for 02/03/20. CCS -  Using the Toilet? N -  Comment Late entry for 02/03/20. CCS -  In the past six months, have you accidently leaked urine? Y -  Comment Late entry for 02/03/20. CCS -  Do you have problems with loss of bowel control? N -   Comment Late entry for 02/03/20. CCS -  Managing your Medications? N -  Comment Late entry for 02/03/20. CCS -  Managing your Finances? N -  Comment Late entry for 02/03/20. CCS -  Housekeeping or managing your Housekeeping? Y -  Comment Late entry for 02/03/20. CCS -  Some recent data might be hidden    Fall/Depression Screening: Fall Risk  04/12/2020 03/04/2019  Falls in the past year? 0 0  Comment Late entry for 02/03/20. CCS -  Number falls in past yr: 0 -  Comment Late entry for 02/03/20. CCS -  Injury with Fall? 0 -  Comment Late entry for 02/03/20. CCS -  Risk for fall due to : Medication side effect -  Risk for fall due to: Comment Late entry for 02/03/20. CCS -  Follow up Falls evaluation completed -  Comment Late entry for 02/03/20. CCS -   PHQ 2/9 Scores 05/01/2020 04/12/2020 03/04/2019  PHQ - 2 Score 0 2 0  PHQ- 9 Score - 4 -    Assessment: CAD Stable                        HF Stable                        Anemia improving                        DM controlled  Goals Addressed              This Visit's Progress     Patient Stated   .  [facility]) Patient Stated (pt-stated)   On track     Pt will call MD or NP if has increasing sxs HF (wt. Gain, wheezing, SOB, Edema) to overt an ED visit over the next 3 months.  Start date 10/23/20, renewed on 03/07/20. High Priority Long term goal Expected end date: 06/26/20 Follow up 06/18/20  03/07/20 Pt has followed her goal but had to go to the ED and be hospitalized for subsequent MI. Reinforced HF and CAD Action plan to weigh daily, take meds, activity to tolerance, go to appts and report any heart sxs early to MD to prevent complications and subsequent trips to the hospital. 03/14/20 Following HF Action plan. Wt is stable, no signs of HF. Pt acknowledges she knows when to call for advice. She will have video visit with cardiology tomorrow She feels she is making progress, striving to get her independence back. She feels she is less SOB and it is  evident in talking with her, she is less SOB when talking. Encouraged her to give all her effort in her therapy  sessions to progress and to be safe. 03/21/20 No acute probems, progressing nicely. Encouraged continued efforts, attending appts, following medical recommendations. Celebrated that she is happy with her progress and that she IS getting better. 03/28/20 Pt reports continueous improvements in her phycial recovery. No HF exacerbating sxs reported. Knows when to call for help. Reinforced Action Plan. 04/04/20 Wt stable between 127-129#, no SOB and minimal edema. General overall health improvement. Praised for her continued self managed and encouraged to continue this life long regimen. 05/03/20 Wt 129#. Has some SOB on exertion but note her Hgb has fallen to 7.0! She is being worked up by hematology, has had a bone scan. She has had a couple of dark stools and has reported this to her GI provider. Dr. Cyndi Bender is trying to get her in the office ASAP. She has moved back to her own home. She is able to get around cautiously using her walker. They kids are checking in on her daily. She carries her cell phone with her at all times to call for help if she needs to. 05/18/20 Has not needed to call for any new significant sxs. Today's wt was 130#. She saw Dr. Domenic Polite this week and he did increase her demadex dose to 40 mg for 3 days the to alternating days of 20 mg and 40 mg. Her niece is assisting her with medication organization for bid dosing. This works very well for her. She will f/u with him in May. Her hgb is up to 8.1, steadily increasing. She has resumed her eliquis and iron supplementation. She has seen her GI specialist.       Other   .  Comorbidities Identified and Managed and documented with pt self management reported each month over the next 3 months.   On track     Pt goal started: 04/04/20 Long term goal High priority Follow up 06/18/20 Expected completion; 07/26/20      Notes: 04/04/20 Other  comorbidities to monitor: DM, CKD, CAD. Chart review revealed Diabetes: Lipids were normal in January! HgbA1C was 6.0 in December! Does not check her glucose at home very often as this is well controlled. Reports she had a foot exam last fall, will have her eye exam: tomorrow.  CKD: labs ordered by nephrology, will get labs tomorrow and pick up jug for 24 hour urine collection. Next appt is last week of Marchl for follow up and discussion regarding lab results. No new CAD sxs and follow up with cardiology scheduled for May. 05/18/20 Labs recently done. CBC improving. Labs ordered by hematologist are mostly out of range. Dr. Leron Croak is following closely. Pt is getting iron infusions. Reviewed safety measures since pt is back at her home. She is getting around better all the time and she is starting to drive again and feels confident. She is extremely cautious when ambulating and avoids taking risks like stepping up on a curb. She will go up the curb cutout. Reinforced safety as #1 and notifying MD of any problems early.        Plan: We agreed to follow up in one month. Follow-up:  Patient agrees to Care Plan and Follow-up.   [name]. [name], MSN, [facility] Gerontological Nurse Practitioner [facility] Care Management [phone]

## 2020-05-23 ENCOUNTER — Encounter (HOSPITAL_COMMUNITY)
Admission: RE | Admit: 2020-05-23 | Discharge: 2020-05-23 | Disposition: A | Discharge status: Home / Self Care | Payer: Medicare Other | Source: Ambulatory Visit | Attending: Nephrology | Admitting: Nephrology

## 2020-05-23 ENCOUNTER — Other Ambulatory Visit: Payer: Self-pay

## 2020-05-23 ENCOUNTER — Other Ambulatory Visit: Payer: Self-pay | Admitting: Cardiology

## 2020-05-23 DIAGNOSIS — D631 Anemia in chronic kidney disease: Secondary | ICD-10-CM | POA: Diagnosis not present

## 2020-05-23 DIAGNOSIS — N184 Chronic kidney disease, stage 4 (severe): Secondary | ICD-10-CM | POA: Insufficient documentation

## 2020-05-23 LAB — POCT HEMOGLOBIN-HEMACUE: Hemoglobin: 9 g/dL — ABNORMAL LOW (ref 12.0–15.0)

## 2020-05-23 MED ORDER — EPOETIN ALFA-EPBX 3000 UNIT/ML IJ SOLN
INTRAMUSCULAR | Status: AC
  Filled 2020-05-23: qty 1

## 2020-05-23 MED ORDER — EPOETIN ALFA-EPBX 3000 UNIT/ML IJ SOLN
3000.0000 [IU] | INTRAMUSCULAR | Status: DC
  Administered 2020-05-23: 3000 [IU] via SUBCUTANEOUS

## 2020-05-23 NOTE — Telephone Encounter (Signed)
This is a [place] pt, Dr. McDowell 

## 2020-05-25 ENCOUNTER — Telehealth: Payer: Self-pay

## 2020-05-25 MED ORDER — TORSEMIDE 20 MG PO TABS: 40.0000 mg | ORAL_TABLET | Freq: Every day | ORAL | 3 refills | Status: DC

## 2020-05-25 NOTE — Telephone Encounter (Signed)
Patient called today to report her feet and ankles have been swelling for the past 30 days. She states she has had lower extremity swelling in the past but nothing like this now. She was started on Torsemide and takes 20 mg daily alternating with 40 mg . Her weight is 127-129 lbs  (last weight we documented was 134 lbs) She says she has frequent urination and has to get up at night to use the bathroom. She denies SOB. She also reports increased bruising after starting Eliquis. I advised her that bruising is an unfortunate side effect of Eliquis.   I will forward to [place] for advice.

## 2020-05-25 NOTE — Telephone Encounter (Signed)
I spoke with patient. She will increase Torsemide to 40 mg daily. We were able to move her apt up to Thursday, May 5 th at 8:20 am with Dr.McDowell.

## 2020-05-25 NOTE — Telephone Encounter (Signed)
I am sorry she is having continued trouble with this.  She was just seen 1 week ago and we adjusted her diuretics.  Have her increase the Demadex to 40 mg daily, she already has follow-up scheduled in May, we could move this up sooner with [place] if there is any room.

## 2020-05-31 DIAGNOSIS — I5042 Chronic combined systolic (congestive) and diastolic (congestive) heart failure: Secondary | ICD-10-CM | POA: Diagnosis not present

## 2020-06-01 ENCOUNTER — Encounter: Payer: Self-pay | Admitting: *Deleted

## 2020-06-01 NOTE — Progress Notes (Signed)
Basic Metabolic Panel Order: [id number]  Ref Range & Units 1 d ago  Sodium 135 - 145 mmol/L 133Low   Potassium 3.5 - 5.0 mmol/L 3.0Low   Chloride 98 - 107 mmol/L 96Low   CO2 21.0 - 32.0 mmol/L 25.7   Anion Gap 3 - 11 mmol/L 11   BUN 8 - 20 mg/dL 42High   Creatinine 0.60 - 1.10 mg/dL 2.24High   BUN/Creatinine Ratio  19   EGFR CKD-EPI Non-African American, Female mL/min/1.75m 20   EGFR CKD-EPI African American, Female mL/min/1.760m23   Glucose 70 - 179 mg/dL 120   Calcium 8.5 - 10.1 mg/dL 9.0   [facility]ing [facility]  Specimen Collected: 05/31/20 10:20 Last Resulted: 05/31/20 11:17  Received From: [facility] Received: 06/01/20 13:58

## 2020-06-01 NOTE — Progress Notes (Signed)
Lab work reviewed.  Potassium low at 3.0, creatinine 2.24 up from 2.02.  Continue with current diuretic regimen, increase KCl to 60 mEq daily from 40 mEq daily.

## 2020-06-04 ENCOUNTER — Telehealth: Payer: Self-pay | Admitting: *Deleted

## 2020-06-04 MED ORDER — POTASSIUM CHLORIDE CRYS ER 20 MEQ PO TBCR
60.0000 meq | EXTENDED_RELEASE_TABLET | Freq: Every day | ORAL | 1 refills | Status: DC
Start: 2020-06-04 — End: 2020-12-04

## 2020-06-04 NOTE — Telephone Encounter (Signed)
Per Crystal [name]-- Lab work reviewed.  Potassium low at 3.0, creatinine 2.24 up from 2.02.  Continue with current diuretic regimen, increase KCl to 60 mEq daily from 40 mEq daily.

## 2020-06-04 NOTE — Telephone Encounter (Signed)
Patient informed and verbalized understanding of plan. 

## 2020-06-04 NOTE — Progress Notes (Signed)
Patient informed and verbalized understanding of plan. 

## 2020-06-05 ENCOUNTER — Ambulatory Visit (INDEPENDENT_AMBULATORY_CARE_PROVIDER_SITE_OTHER): Payer: Medicare Other

## 2020-06-05 DIAGNOSIS — R931 Abnormal findings on diagnostic imaging of heart and coronary circulation: Secondary | ICD-10-CM | POA: Diagnosis not present

## 2020-06-05 DIAGNOSIS — I5042 Chronic combined systolic (congestive) and diastolic (congestive) heart failure: Secondary | ICD-10-CM | POA: Diagnosis not present

## 2020-06-05 LAB — ECHOCARDIOGRAM COMPLETE
AV Vena cont: 0.35 cm
Area-P 1/2: 1.46 cm2
Calc EF: 77.1 %
MV M vel: 6.31 m/s
MV Peak grad: 159 mmHg
P 1/2 time: 956 msec
Radius: 0.43 cm
S' Lateral: 2.71 cm
Single Plane A2C EF: 81.3 %
Single Plane A4C EF: 73.3 %

## 2020-06-06 ENCOUNTER — Other Ambulatory Visit: Payer: Self-pay

## 2020-06-06 ENCOUNTER — Encounter (HOSPITAL_COMMUNITY)
Admission: RE | Admit: 2020-06-06 | Discharge: 2020-06-06 | Disposition: A | Discharge status: Home / Self Care | Payer: Medicare Other | Source: Ambulatory Visit | Attending: Nephrology | Admitting: Nephrology

## 2020-06-06 DIAGNOSIS — N184 Chronic kidney disease, stage 4 (severe): Secondary | ICD-10-CM | POA: Insufficient documentation

## 2020-06-06 LAB — POCT HEMOGLOBIN-HEMACUE: Hemoglobin: 10.5 g/dL — ABNORMAL LOW (ref 12.0–15.0)

## 2020-06-06 NOTE — Progress Notes (Signed)
Cardiology Office Note  Date: 06/07/2020   ID: Crystal [name], DOB [birth year], MRN [id number]  PCP:  Glenda Chroman, MD  Cardiologist:  Rozann Lesches, MD Electrophysiologist:  None   Chief Complaint  Patient presents with  . Cardiac follow-up    History of Present Illness: Crystal [name] is an 86 y.o. female last seen in April.  She is here for a follow-up visit.  Still reports leg edema, worse later in the day, but looks to be less apparent on examination today and her weight is also down.  At the last visit Demadex dose was increased.  Follow-up lab work showed potassium 3.0 and creatinine 2.24.  KCl was increased to 60 mEq daily with continued diuretic plan.  Recent follow-up echocardiogram shows improvement in LVEF to the range of 65 to 70%, moderate diastolic dysfunction, normal RV contraction with mildly increased RVSP, mild to moderate mitral regurgitation, and moderate tricuspid regurgitation.  We went over her medications.  I talked with her about reducing Norvasc to see if this would help with her leg swelling, also stopping Plavix at this point as she continues to have trouble with easy bruising and is on Eliquis for stroke prophylaxis with PAF.  Past Medical History:  Diagnosis Date  . Anemia   . Bell's palsy   . Breast cancer ([place]) 1998   Right mastectomy  . CAD (coronary artery disease)    a. s/p STEMI in 01/2019 with DES to mid-LAD  . CHF (congestive heart failure) ([place])    a. EF 30-35% by echo in 01/2019  . Chronic kidney disease   . Essential hypertension   . GERD (gastroesophageal reflux disease)   . Gout   . History of skin cancer    Squamous cell, left shoulder  . Mixed hyperlipidemia   . Osteopenia   . Paroxysmal atrial fibrillation ([facility])   . Thyroid nodule   . Type 2 diabetes mellitus ([place])     Past Surgical History:  Procedure Laterality Date  . BIOPSY  10/13/2019   Procedure: BIOPSY;  Surgeon: Daneil Dolin, MD;  [facility];  Service: Endoscopy;;  . CATARACT EXTRACTION  2016  . COLONOSCOPY  2019   Dr Anthony Sar  . CORONARY ANGIOGRAPHY N/A 01/16/2020   Procedure: CORONARY ANGIOGRAPHY;  Surgeon: Jettie Booze, MD;  Location: [place] [facility];  Service: Cardiovascular;  Laterality: N/A;  . CORONARY STENT INTERVENTION N/A 01/16/2020   Procedure: CORONARY STENT INTERVENTION;  Surgeon: Jettie Booze, MD;  Location: [place] [facility];  Service: Cardiovascular;  Laterality: N/A;  . CORONARY/GRAFT ACUTE MI REVASCULARIZATION N/A 01/30/2019   Procedure: Coronary/Graft Acute MI Revascularization;  Surgeon: Burnell Blanks, MD;  Location: [place] [facility];  Service: Cardiovascular;  Laterality: N/A;  . ESOPHAGOGASTRODUODENOSCOPY (EGD) WITH PROPOFOL N/A 10/13/2019   Non-obstructing Schatzki ring at GE junction, s/p dilation, erosive gastropathy with stigmata of recent bleeding, normal duodenum. Negative H.pylori.   Marland Kitchen HYSTEROSCOPY    . INTRAVASCULAR ULTRASOUND/IVUS N/A 01/16/2020   Procedure: Intravascular Ultrasound/IVUS;  Surgeon: Jettie Booze, MD;  Location: [place] [facility];  Service: Cardiovascular;  Laterality: N/A;  . LEFT HEART CATH AND CORONARY ANGIOGRAPHY N/A 01/30/2019   Procedure: LEFT HEART CATH AND CORONARY ANGIOGRAPHY;  Surgeon: Burnell Blanks, MD;  Location: [place] [facility];  Service: Cardiovascular;  Laterality: N/A;  . LEFT HEART CATH AND CORONARY ANGIOGRAPHY N/A 01/16/2020   Procedure: LEFT HEART CATH AND CORONARY ANGIOGRAPHY;  Surgeon:  Jettie Booze, MD;  Location: [place] [facility];  Service: Cardiovascular;  Laterality: N/A;  . Venia Minks DILATION N/A 10/13/2019   Procedure: Venia Minks DILATION;  Surgeon: Daneil Dolin, MD;  [facility];  Service: Endoscopy;  Laterality: N/A;  . Right mastectomy  1998   [place]  . RIGHT/LEFT HEART CATH AND CORONARY ANGIOGRAPHY N/A 02/13/2020   Procedure: RIGHT/LEFT HEART CATH AND CORONARY ANGIOGRAPHY;  Surgeon:  Martinique, Peter M, MD;  Location: [place] [facility];  Service: Cardiovascular;  Laterality: N/A;    Current Outpatient Medications  Medication Sig Dispense Refill  . acetaminophen (TYLENOL) 500 MG tablet Take 500 mg by mouth every 6 (six) hours as needed for headache (pain).    Marland Kitchen amiodarone (PACERONE) 200 MG tablet Take 200 mg by mouth daily.    Marland Kitchen amLODipine (NORVASC) 2.5 MG tablet Take 1 tablet (2.5 mg total) by mouth daily. 90 tablet 3  . apixaban (ELIQUIS) 2.5 MG TABS tablet Take 1 tablet (2.5 mg total) by mouth 2 (two) times daily. 60 tablet 0  . Calcium Carbonate-Vitamin D (CALCIUM 500 + D PO) Take 1 tablet by mouth daily before lunch. Citracal plus D3    . carvedilol (COREG) 25 MG tablet Take 1 tablet (25 mg total) by mouth in the morning and at bedtime. Breakfast & supper 180 tablet 3  . cholecalciferol (VITAMIN D3) 25 MCG (1000 UT) tablet Take 1,000 Units by mouth daily with breakfast.    . CINNAMON PO Take 1,000 mg by mouth daily before lunch.     . diazepam (VALIUM) 2 MG tablet Take 2 mg by mouth 2 (two) times daily as needed (dizzy spells.).    Marland Kitchen epoetin alfa (EPOGEN) 3000 UNIT/ML injection Inject into the skin.    . ferrous sulfate 325 (65 FE) MG tablet Take 325 mg by mouth daily before lunch.    . hydrALAZINE (APRESOLINE) 100 MG tablet TAKE 1 TABLET EVERY 8 HOURS 270 tablet 0  . isosorbide mononitrate (IMDUR) 120 MG 24 hr tablet Take 1 tablet (120 mg total) by mouth at bedtime. 30 tablet 0  . meclizine (ANTIVERT) 25 MG tablet Take 25 mg by mouth 2 (two) times daily as needed for dizziness.    . Multiple Vitamin (MULTIVITAMIN WITH MINERALS) TABS tablet Take 1 tablet by mouth daily. Centrum Silver for Women    . multivitamin-lutein (OCUVITE-LUTEIN) CAPS capsule Take 1 capsule by mouth daily before lunch.     . nitroGLYCERIN (NITROSTAT) 0.4 MG SL tablet Place 1 tablet (0.4 mg total) under the tongue every 5 (five) minutes x 3 doses as needed for chest pain. 25 tablet 2  . Omega-3 Fatty  Acids (FISH OIL PO) Take 1,576 mg by mouth daily before lunch.    . pantoprazole (PROTONIX) 40 MG tablet Take 1 tablet (40 mg total) by mouth daily. Take 30 minutes before breakfast 90 tablet 3  . potassium chloride SA (KLOR-CON) 20 MEQ tablet Take 3 tablets (60 mEq total) by mouth daily. 270 tablet 1  . rosuvastatin (CRESTOR) 5 MG tablet Take 0.5 tablets (2.5 mg total) by mouth every other day. 30 tablet 0  . sodium chloride (OCEAN) 0.65 % SOLN nasal spray Place 1 spray into both nostrils 2 (two) times daily as needed for congestion.     . torsemide (DEMADEX) 20 MG tablet Take 2 tablets (40 mg total) by mouth daily. 180 tablet 3  . loperamide (IMODIUM) 2 MG capsule Take 2-4 mg by mouth 4 (four) times daily  as needed for diarrhea or loose stools.    Marland Kitchen saccharomyces boulardii (FLORASTOR) 250 MG capsule Take 250 mg by mouth 2 (two) times daily as needed (diarrhea).     No current facility-administered medications for this visit.   Allergies:  Bactrim [sulfamethoxazole-trimethoprim], Sulfa antibiotics, Amlodipine, Clonidine derivatives, Evista [raloxifene hcl], Fosamax [alendronate sodium], Glipizide, Lipitor [atorvastatin calcium], Losartan, Pravastatin, and Azithromycin   ROS: No palpitations or syncope.  Physical Exam: VS:  BP 128/72   Pulse (!) 58   Ht 5\' 5"  (1.651 m)   Wt 128 lb 12.8 oz (58.4 kg)   SpO2 98%   BMI 21.43 kg/m , BMI Body mass index is 21.43 kg/m.  Wt Readings from Last 3 Encounters:  06/07/20 128 lb 12.8 oz (58.4 kg)  05/17/20 134 lb (60.8 kg)  05/14/20 134 lb 9.6 oz (61.1 kg)    General: Elderly woman, appears comfortable at rest. HEENT: Conjunctiva and lids normal, wearing a mask. Neck: Supple, no elevated JVP or carotid bruits, no thyromegaly. Lungs: Decreased basilar breath sounds, nonlabored breathing at rest. Cardiac: Regular rate and rhythm, no S3, 2/6 systolic murmur, no pericardial rub. Extremities: 2+ leg edema and venous stasis.  ECG:  An ECG dated  02/24/2020 was personally reviewed today and demonstrated:  Atrial fibrillation with RVR, nonspecific T wave changes.  Recent Labwork: 02/14/2020: TSH 2.238 02/23/2020: B Natriuretic Peptide 3,507.5 02/29/2020: ALT 31; AST 20 03/02/2020: Magnesium 1.7 03/04/2020: BUN 42; Creatinine, Ser 2.02; Potassium 4.0; Sodium 135 05/08/2020: Platelets 356 06/06/2020: Hemoglobin 10.5     Component Value Date/Time   CHOL 155 02/14/2020 0339   TRIG 74 02/14/2020 0339   HDL 57 02/14/2020 0339   CHOLHDL 2.7 02/14/2020 0339   VLDL 15 02/14/2020 0339   LDLCALC 83 02/14/2020 0339  May 31, 2020: Potassium 3.0, BUN 42, creatinine 2.24  Other Studies Reviewed Today:  Cardiac catheterization 02/13/2020:  Non-stenotic Mid LAD lesion was previously treated.  Prox Cx to Mid Cx lesion is 20% stenosed.  Dist RCA lesion is 20% stenosed.  Non-stenotic Mid RCA lesion was previously treated.  Hemodynamic findings consistent with mild pulmonary hypertension.  LV end diastolic pressure is mildly elevated.  1. Nonobstructive CAD. Continued excellent patency of stents in the LAD and RCA 2. Mildly elevated LV filling pressures 3. Mildly elevated right heart pressures. 4. Normal cardiac output  Echocardiogram 06/05/2020: 1. Left ventricular ejection fraction, by estimation, is 65 to 70%. The  left ventricle has normal function. The left ventricle has no regional  wall motion abnormalities. There is mild left ventricular hypertrophy.  Left ventricular diastolic parameters  are consistent with Grade II diastolic dysfunction (pseudonormalization).  2. Right ventricular systolic function is normal. The right ventricular  size is normal. There is mildly elevated pulmonary artery systolic  pressure. The estimated right ventricular systolic pressure is 85.8 mmHg.  3. Left atrial size was mildly dilated.  4. Right atrial size was moderately dilated.  5. There is a trivial pericardial effusion posterior to the left   ventricle.  6. The mitral valve is abnormal. Mild to moderate mitral valve  regurgitation.  7. Tricuspid valve regurgitation is moderate.  8. The aortic valve is tricuspid. Aortic valve regurgitation is trivial.  Aortic regurgitation PHT measures 956 msec.  9. The inferior vena cava is normal in size with <50% respiratory  variability, suggesting right atrial pressure of 8 mmHg.   Assessment and Plan:  1.  Ischemic cardiomyopathy with improvement in LVEF to the range of 65 to 70%  by recent follow-up echocardiogram.  Continue Coreg, hydralazine, and Imdur.  She is not on ARB/ANRI/Aldactone/SGLT2 inhibitor due to degree of renal insufficiency.  2.  CKD stage IIIb-IV.  Recent creatinine 2.24.  3.  Bilateral leg edema, somewhat better with increased dose Demadex, but balancing dose with degree of renal insufficiency.  Reduce Norvasc to 2.5 mg daily to see if this also helps.  4.  Paroxysmal atrial fibrillation with CHA2DS2-VASc score of 7.  Continue amiodarone and Eliquis.  5.  CAD status post DES to the LAD in January 2021 and DES to the mid RCA in December 2021, patent at angiography in January.  She reports significant recurring bruising and easy bleeding, we will plan to stop Plavix now at 6 months out.  Continue Coreg and Crestor.  Medication Adjustments/Labs and Tests Ordered: Current medicines are reviewed at length with the patient today.  Concerns regarding medicines are outlined above.   Tests Ordered: Orders Placed This Encounter  Procedures  . Basic metabolic panel    Medication Changes: Meds ordered this encounter  Medications  . amLODipine (NORVASC) 2.5 MG tablet    Sig: Take 1 tablet (2.5 mg total) by mouth daily.    Dispense:  90 tablet    Refill:  3    06/07/20 dose decreased to 2.5 mg qd    Disposition:  Follow up 3 months with BMET.  Signed, Satira Sark, MD, [facility] 06/07/2020 8:43 AM    [facility] [place]. [address] [place], [place] [address] Phone: [phone]; Fax: [phone]

## 2020-06-06 NOTE — Progress Notes (Signed)
Pt refused to have monthly labs drawn. Stated "You just drew them last time I was here. I go see Dr Theador Hawthorne in 2 weeks and they will check my blood." No blood level results  in Epic for 05/23/20. Hgb was 9.0 on 05/23/20.

## 2020-06-07 ENCOUNTER — Ambulatory Visit (INDEPENDENT_AMBULATORY_CARE_PROVIDER_SITE_OTHER): Payer: Medicare Other | Admitting: Cardiology

## 2020-06-07 ENCOUNTER — Telehealth: Payer: Self-pay

## 2020-06-07 ENCOUNTER — Other Ambulatory Visit: Payer: Self-pay | Admitting: Cardiology

## 2020-06-07 ENCOUNTER — Encounter: Payer: Self-pay | Admitting: Cardiology

## 2020-06-07 VITALS — BP 128/72 | HR 58 | Ht 65.0 in | Wt 128.8 lb

## 2020-06-07 DIAGNOSIS — I255 Ischemic cardiomyopathy: Secondary | ICD-10-CM

## 2020-06-07 DIAGNOSIS — N1832 Chronic kidney disease, stage 3b: Secondary | ICD-10-CM | POA: Diagnosis not present

## 2020-06-07 DIAGNOSIS — I480 Paroxysmal atrial fibrillation: Secondary | ICD-10-CM | POA: Diagnosis not present

## 2020-06-07 DIAGNOSIS — I25119 Atherosclerotic heart disease of native coronary artery with unspecified angina pectoris: Secondary | ICD-10-CM

## 2020-06-07 MED ORDER — AMLODIPINE BESYLATE 2.5 MG PO TABS: 2.5000 mg | ORAL_TABLET | Freq: Every day | ORAL | 3 refills | Status: DC

## 2020-06-07 NOTE — Patient Instructions (Signed)
Medication Instructions:  STOP Plavix   DECREASE Amlodipine to 2.5 mg daily  *If you need a refill on your cardiac medications before your next appointment, please call your pharmacy*   Lab Work: BMET JUST BEFORE VISIT IN 3 MONTHS  If you have labs (blood work) drawn today and your tests are completely normal, you will receive your results only by: Marland Kitchen MyChart Message (if you have MyChart) OR . A paper copy in the mail If you have any lab test that is abnormal or we need to change your treatment, we will call you to review the results.   Testing/Procedures: NONE TODAY   Follow-Up: At [facility], you and your health needs are our priority.  As part of our continuing mission to provide you with exceptional heart care, we have created designated Provider Care Teams.  These Care Teams include your primary Cardiologist (physician) and Advanced Practice Providers (APPs -  Physician Assistants and Nurse Practitioners) who all work together to provide you with the care you need, when you need it.  We recommend signing up for the patient portal called "MyChart".  Sign up information is provided on this After Visit Summary.  MyChart is used to connect with patients for Virtual Visits (Telemedicine).  Patients are able to view lab/test results, encounter notes, upcoming appointments, etc.  Non-urgent messages can be sent to your provider as well.   To learn more about what you can do with MyChart, go to [email or web address].    Your next appointment:   3 month(s)  The format for your next appointment:   In Person  Provider:   Bernerd Pho, PA-C   Other Instructions NONE

## 2020-06-07 NOTE — Telephone Encounter (Signed)
Echo results discussed with patient at office visit this am.

## 2020-06-08 NOTE — Telephone Encounter (Signed)
Prescription refill request for Eliquis received. Indication: afib  Last office visit: [place], 06/08/2019 Scr: 2.02, 03/04/2020 Age: 86 yo  Weight: 58.4 kg   Pt is on the correct dose of Eliquis per dosing criteria, prescription refill sent for Eliquis 2.5mg  BID.

## 2020-06-12 ENCOUNTER — Telehealth: Payer: Self-pay | Admitting: *Deleted

## 2020-06-12 NOTE — Telephone Encounter (Signed)
-----   Message from Verta Ellen., NP sent at 06/06/2020  3:01 PM EDT ----- Please call the patient and let her know the echocardiogram showed she has good pumping function of the heart.  The main pumping chamber is a little muscular and stiff and has problems relaxing when it is trying to fill with blood.  Best management is to keep blood pressure at or below 130/80 consistently and manage all other risk factors.  The mitral valve has a mild to moderate leak as well as the tricuspid valve.  This is not uncommon with aging.  Thank you

## 2020-06-12 NOTE — Telephone Encounter (Signed)
Laurine Blazer, LPN  7/57/3225 6:72 PM EDT Back to Top     Notified, copy to pcp.

## 2020-06-13 DIAGNOSIS — N184 Chronic kidney disease, stage 4 (severe): Secondary | ICD-10-CM | POA: Diagnosis not present

## 2020-06-13 DIAGNOSIS — D692 Other nonthrombocytopenic purpura: Secondary | ICD-10-CM | POA: Diagnosis not present

## 2020-06-13 DIAGNOSIS — R600 Localized edema: Secondary | ICD-10-CM | POA: Diagnosis not present

## 2020-06-13 DIAGNOSIS — R5383 Other fatigue: Secondary | ICD-10-CM | POA: Diagnosis not present

## 2020-06-13 DIAGNOSIS — Z299 Encounter for prophylactic measures, unspecified: Secondary | ICD-10-CM | POA: Diagnosis not present

## 2020-06-13 DIAGNOSIS — E1165 Type 2 diabetes mellitus with hyperglycemia: Secondary | ICD-10-CM | POA: Diagnosis not present

## 2020-06-14 DIAGNOSIS — E871 Hypo-osmolality and hyponatremia: Secondary | ICD-10-CM | POA: Diagnosis not present

## 2020-06-14 DIAGNOSIS — D638 Anemia in other chronic diseases classified elsewhere: Secondary | ICD-10-CM | POA: Diagnosis not present

## 2020-06-14 DIAGNOSIS — E876 Hypokalemia: Secondary | ICD-10-CM | POA: Diagnosis not present

## 2020-06-14 DIAGNOSIS — E1122 Type 2 diabetes mellitus with diabetic chronic kidney disease: Secondary | ICD-10-CM | POA: Diagnosis not present

## 2020-06-14 DIAGNOSIS — I5032 Chronic diastolic (congestive) heart failure: Secondary | ICD-10-CM | POA: Diagnosis not present

## 2020-06-14 DIAGNOSIS — N189 Chronic kidney disease, unspecified: Secondary | ICD-10-CM | POA: Diagnosis not present

## 2020-06-14 DIAGNOSIS — I129 Hypertensive chronic kidney disease with stage 1 through stage 4 chronic kidney disease, or unspecified chronic kidney disease: Secondary | ICD-10-CM | POA: Diagnosis not present

## 2020-06-15 DIAGNOSIS — N186 End stage renal disease: Secondary | ICD-10-CM | POA: Diagnosis not present

## 2020-06-15 DIAGNOSIS — D638 Anemia in other chronic diseases classified elsewhere: Secondary | ICD-10-CM | POA: Diagnosis not present

## 2020-06-15 DIAGNOSIS — E876 Hypokalemia: Secondary | ICD-10-CM | POA: Diagnosis not present

## 2020-06-15 DIAGNOSIS — B700 Diphyllobothriasis: Secondary | ICD-10-CM | POA: Diagnosis not present

## 2020-06-15 DIAGNOSIS — I129 Hypertensive chronic kidney disease with stage 1 through stage 4 chronic kidney disease, or unspecified chronic kidney disease: Secondary | ICD-10-CM | POA: Diagnosis not present

## 2020-06-15 DIAGNOSIS — E871 Hypo-osmolality and hyponatremia: Secondary | ICD-10-CM | POA: Diagnosis not present

## 2020-06-15 DIAGNOSIS — E1122 Type 2 diabetes mellitus with diabetic chronic kidney disease: Secondary | ICD-10-CM | POA: Diagnosis not present

## 2020-06-15 DIAGNOSIS — N189 Chronic kidney disease, unspecified: Secondary | ICD-10-CM | POA: Diagnosis not present

## 2020-06-18 ENCOUNTER — Other Ambulatory Visit: Payer: Self-pay

## 2020-06-18 ENCOUNTER — Other Ambulatory Visit: Payer: Self-pay | Admitting: *Deleted

## 2020-06-18 NOTE — Patient Outreach (Signed)
[facility]) Care Management  [facility] Social Work  06/18/2020  Crystal [name] [birth year] [id number]  Subjective:  Telephone assessment to follow up HF, CKD. Plavix was dc'd, Demadex increased to 40 mg daily, potassium increased to 3 20 meq capsules.  Encounter Medications:  Outpatient Encounter Medications as of 06/18/2020  Medication Sig Note  . acetaminophen (TYLENOL) 500 MG tablet Take 500 mg by mouth every 6 (six) hours as needed for headache (pain).   Marland Kitchen amiodarone (PACERONE) 200 MG tablet Take 200 mg by mouth daily.   Marland Kitchen amLODipine (NORVASC) 2.5 MG tablet Take 1 tablet (2.5 mg total) by mouth daily.   . Calcium Carbonate-Vitamin D (CALCIUM 500 + D PO) Take 1 tablet by mouth daily before lunch. Citracal plus D3   . carvedilol (COREG) 25 MG tablet Take 1 tablet (25 mg total) by mouth in the morning and at bedtime. Breakfast & supper   . cholecalciferol (VITAMIN D3) 25 MCG (1000 UT) tablet Take 1,000 Units by mouth daily with breakfast.   . CINNAMON PO Take 1,000 mg by mouth daily before lunch.    . diazepam (VALIUM) 2 MG tablet Take 2 mg by mouth 2 (two) times daily as needed (dizzy spells.).   Marland Kitchen ELIQUIS 2.5 MG TABS tablet TAKE (1) TABLET TWICE DAILY.   Marland Kitchen epoetin alfa (EPOGEN) 3000 UNIT/ML injection Inject into the skin.   . ferrous sulfate 325 (65 FE) MG tablet Take 325 mg by mouth daily before lunch. 05/18/2020: HAS RESTARTED ON 05/18/20  . hydrALAZINE (APRESOLINE) 100 MG tablet TAKE 1 TABLET EVERY 8 HOURS   . isosorbide mononitrate (IMDUR) 120 MG 24 hr tablet Take 1 tablet (120 mg total) by mouth at bedtime.   Marland Kitchen loperamide (IMODIUM) 2 MG capsule Take 2-4 mg by mouth 4 (four) times daily as needed for diarrhea or loose stools.   . meclizine (ANTIVERT) 25 MG tablet Take 25 mg by mouth 2 (two) times daily as needed for dizziness.   . Multiple Vitamin (MULTIVITAMIN WITH MINERALS) TABS tablet Take 1 tablet by mouth daily. Centrum Silver for Women   . multivitamin-lutein  (OCUVITE-LUTEIN) CAPS capsule Take 1 capsule by mouth daily before lunch.    . nitroGLYCERIN (NITROSTAT) 0.4 MG SL tablet Place 1 tablet (0.4 mg total) under the tongue every 5 (five) minutes x 3 doses as needed for chest pain.   . Omega-3 Fatty Acids (FISH OIL PO) Take 1,576 mg by mouth daily before lunch.   . pantoprazole (PROTONIX) 40 MG tablet Take 1 tablet (40 mg total) by mouth daily. Take 30 minutes before breakfast   . potassium chloride SA (KLOR-CON) 20 MEQ tablet Take 3 tablets (60 mEq total) by mouth daily.   . rosuvastatin (CRESTOR) 5 MG tablet Take 0.5 tablets (2.5 mg total) by mouth every other day.   Marland Kitchen saccharomyces boulardii (FLORASTOR) 250 MG capsule Take 250 mg by mouth 2 (two) times daily as needed (diarrhea).   . sodium chloride (OCEAN) 0.65 % SOLN nasal spray Place 1 spray into both nostrils 2 (two) times daily as needed for congestion.    . torsemide (DEMADEX) 20 MG tablet Take 2 tablets (40 mg total) by mouth daily.    No facility-administered encounter medications on file as of 06/18/2020.    Functional Status:  In your present state of health, do you have any difficulty performing the following activities: 04/12/2020 02/13/2020  Hearing? - N  Vision? - N  Difficulty concentrating or making decisions? - N  Walking  or climbing stairs? - Y  Dressing or bathing? - N  Doing errands, shopping? - Y  Preparing Food and eating ? N -  Comment Late entry for 02/03/20. CCS -  Using the Toilet? N -  Comment Late entry for 02/03/20. CCS -  In the past six months, have you accidently leaked urine? Y -  Comment Late entry for 02/03/20. CCS -  Do you have problems with loss of bowel control? N -  Comment Late entry for 02/03/20. CCS -  Managing your Medications? N -  Comment Late entry for 02/03/20. CCS -  Managing your Finances? N -  Comment Late entry for 02/03/20. CCS -  Housekeeping or managing your Housekeeping? Y -  Comment Late entry for 02/03/20. CCS -  Some recent data might be  hidden    Fall/Depression Screening:  PHQ 2/9 Scores 05/01/2020 04/12/2020 03/04/2019  PHQ - 2 Score 0 2 0  PHQ- 9 Score - 4 -    Assessment: HF stable (10# wt loss with increase in demadex daily dose                        CKD stable  Goals Addressed              This Visit's Progress     Patient Stated   .  [facility]) Patient Stated (pt-stated)        Pt will call MD or NP if has increasing sxs HF (wt. Gain, wheezing, SOB, Edema) to overt an ED visit over the next 3 months.  Start date 10/23/20, renewed on 03/07/20., renewed 06/18/20  High Priority Long term goal Expected end date: Follow up 09/25/20  Barriers: Health Behaviors Knowledge  03/07/20 Pt has followed her goal but had to go to the ED and be hospitalized for subsequent MI. Reinforced HF and CAD Action plan to weigh daily, take meds, activity to tolerance, go to appts and report any heart sxs early to MD to prevent complications and subsequent trips to the hospital. 03/14/20 Following HF Action plan. Wt is stable, no signs of HF. Pt acknowledges she knows when to call for advice. She will have video visit with cardiology tomorrow She feels she is making progress, striving to get her independence back. She feels she is less SOB and it is evident in talking with her, she is less SOB when talking. Encouraged her to give all her effort in her therapy sessions to progress and to be safe. 03/21/20 No acute probems, progressing nicely. Encouraged continued efforts, attending appts, following medical recommendations. Celebrated that she is happy with her progress and that she IS getting better. 03/28/20 Pt reports continueous improvements in her phycial recovery. No HF exacerbating sxs reported. Knows when to call for help. Reinforced Action Plan. 04/04/20 Wt stable between 127-129#, no SOB and minimal edema. General overall health improvement. Praised for her continued self managed and encouraged to continue this life long regimen. 05/03/20 Wt 129#. Has  some SOB on exertion but note her Hgb has fallen to 7.0! She is being worked up by hematology, has had a bone scan. She has had a couple of dark stools and has reported this to her GI provider. Dr. Cyndi Bender is trying to get her in the office ASAP. She has moved back to her own home. She is able to get around cautiously using her walker. They kids are checking in on her daily. She carries her cell phone with her at  all times to call for help if she needs to. 05/18/20 Has not needed to call for any new significant sxs. Today's wt was 130#. She saw Dr. Domenic Polite this week and he did increase her demadex dose to 40 mg for 3 days the to alternating days of 20 mg and 40 mg. Her niece is assisting her with medication organization for bid dosing. This works very well for her. She will f/u with him in May. Her hgb is up to 8.1, steadily increasing. She has resumed her eliquis and iron supplementation. She has seen her GI specialist. 06/18/20 Today's wt is 120# (10# wt loss) Demadex has been increased to 40 mg daily continuously now. Reinforced low salt diet, especially when eating out. Ask for low Na and heart healthy choices. Dr. Domenic Polite took her off of Plavix only on Eliquis now.      Other   .  Comorbidities Identified and Managed and documented with pt self management reported each month over the next 3 months.        Pt goal started: 04/04/20 Long term goal High priority Follow up 06/18/20 Expected completion; 07/26/20      Notes: 04/04/20 Other comorbidities to monitor: DM, CKD, CAD. Chart review revealed Diabetes: Lipids were normal in January! HgbA1C was 6.0 in December! Does not check her glucose at home very often as this is well controlled. Reports she had a foot exam last fall, will have her eye exam: tomorrow.  CKD: labs ordered by nephrology, will get labs tomorrow and pick up jug for 24 hour urine collection. Next appt is last week of Marchl for follow up and discussion regarding lab results. No new CAD sxs  and follow up with cardiology scheduled for May. 05/18/20 Labs recently done. CBC improving. Labs ordered by hematologist are mostly out of range. Dr. Leron Croak is following closely. Pt is getting iron infusions. Reviewed safety measures since pt is back at her home. She is getting around better all the time and she is starting to drive again and feels confident. She is extremely cautious when ambulating and avoids taking risks like stepping up on a curb. She will go up the curb cutout. Reinforced safety as #1 and notifying MD of any problems early. 06/18/20 Pt is feeling the best she has felt, HF more stable, CKD also stable back to stage III. She is able to do her housework slowly as she tires easily. She says she cannot bend over and clean her bath tub. Suggested she use a mop and that way she doesn't need to bend over. Her thyroid levels were recently checked and that is normal range.         Plan:  Advised that Dr. Woody Seller office will have a new [place] and this NP will transition her to that nurse when they initiate their program. NP to advise of change and otherwise we have agreed to talk again in one month Follow-up:  Patient agrees to Care Plan and Follow-up.   [name]. [name], MSN, [facility] Gerontological Nurse Practitioner [facility] Care Management [phone]

## 2020-06-19 DIAGNOSIS — I10 Essential (primary) hypertension: Secondary | ICD-10-CM | POA: Diagnosis not present

## 2020-06-19 DIAGNOSIS — Z299 Encounter for prophylactic measures, unspecified: Secondary | ICD-10-CM | POA: Diagnosis not present

## 2020-06-19 DIAGNOSIS — H6121 Impacted cerumen, right ear: Secondary | ICD-10-CM | POA: Diagnosis not present

## 2020-06-20 ENCOUNTER — Encounter (HOSPITAL_COMMUNITY)
Admission: RE | Admit: 2020-06-20 | Discharge: 2020-06-20 | Disposition: A | Discharge status: Home / Self Care | Payer: Medicare Other | Source: Ambulatory Visit | Attending: Nephrology | Admitting: Nephrology

## 2020-06-20 ENCOUNTER — Other Ambulatory Visit: Payer: Self-pay

## 2020-06-20 DIAGNOSIS — N184 Chronic kidney disease, stage 4 (severe): Secondary | ICD-10-CM | POA: Diagnosis not present

## 2020-06-20 LAB — CBC
HCT: 32.2 % — ABNORMAL LOW (ref 36.0–46.0)
Hemoglobin: 10.5 g/dL — ABNORMAL LOW (ref 12.0–15.0)
MCH: 27.9 pg (ref 26.0–34.0)
MCHC: 32.6 g/dL (ref 30.0–36.0)
MCV: 85.4 fL (ref 80.0–100.0)
Platelets: 238 10*3/uL (ref 150–400)
RBC: 3.77 MIL/uL — ABNORMAL LOW (ref 3.87–5.11)
RDW: 16.3 % — ABNORMAL HIGH (ref 11.5–15.5)
WBC: 6 10*3/uL (ref 4.0–10.5)
nRBC: 0 % (ref 0.0–0.2)

## 2020-06-20 LAB — RENAL FUNCTION PANEL
Albumin: 4.1 g/dL (ref 3.5–5.0)
Anion gap: 11 (ref 5–15)
BUN: 47 mg/dL — ABNORMAL HIGH (ref 8–23)
CO2: 25 mmol/L (ref 22–32)
Calcium: 9.4 mg/dL (ref 8.9–10.3)
Chloride: 98 mmol/L (ref 98–111)
Creatinine, Ser: 2.42 mg/dL — ABNORMAL HIGH (ref 0.44–1.00)
GFR, Estimated: 19 mL/min — ABNORMAL LOW (ref >60)
Glucose, Bld: 98 mg/dL (ref 70–99)
Phosphorus: 4.1 mg/dL (ref 2.5–4.6)
Potassium: 3.7 mmol/L (ref 3.5–5.1)
Sodium: 134 mmol/L — ABNORMAL LOW (ref 135–145)

## 2020-06-20 LAB — IRON AND TIBC
Iron: 63 ug/dL (ref 28–170)
Saturation Ratios: 20 % (ref 10.4–31.8)
TIBC: 310 ug/dL (ref 250–450)
UIBC: 247 ug/dL

## 2020-06-20 LAB — POCT HEMOGLOBIN-HEMACUE: Hemoglobin: 10.7 g/dL — ABNORMAL LOW (ref 12.0–15.0)

## 2020-06-20 MED ORDER — EPOETIN ALFA-EPBX 3000 UNIT/ML IJ SOLN
3000.0000 [IU] | Freq: Once | INTRAMUSCULAR | Status: DC
Start: 2020-06-20 — End: 2020-06-21

## 2020-06-20 MED ORDER — EPOETIN ALFA-EPBX 3000 UNIT/ML IJ SOLN: 3000.0000 [IU] | INTRAMUSCULAR | Status: DC

## 2020-06-21 ENCOUNTER — Other Ambulatory Visit: Payer: Medicare Other

## 2020-06-22 ENCOUNTER — Other Ambulatory Visit: Payer: Self-pay | Admitting: Cardiology

## 2020-06-26 ENCOUNTER — Ambulatory Visit: Payer: Medicare Other | Admitting: Cardiology

## 2020-07-02 DIAGNOSIS — H353132 Nonexudative age-related macular degeneration, bilateral, intermediate dry stage: Secondary | ICD-10-CM | POA: Diagnosis not present

## 2020-07-02 DIAGNOSIS — E113293 Type 2 diabetes mellitus with mild nonproliferative diabetic retinopathy without macular edema, bilateral: Secondary | ICD-10-CM | POA: Diagnosis not present

## 2020-07-02 DIAGNOSIS — H26493 Other secondary cataract, bilateral: Secondary | ICD-10-CM | POA: Diagnosis not present

## 2020-07-02 DIAGNOSIS — H52203 Unspecified astigmatism, bilateral: Secondary | ICD-10-CM | POA: Diagnosis not present

## 2020-07-04 ENCOUNTER — Encounter (HOSPITAL_COMMUNITY)
Admission: RE | Admit: 2020-07-04 | Discharge: 2020-07-04 | Disposition: A | Discharge status: Home / Self Care | Payer: Medicare Other | Source: Ambulatory Visit | Attending: Nephrology | Admitting: Nephrology

## 2020-07-04 ENCOUNTER — Other Ambulatory Visit: Payer: Self-pay

## 2020-07-04 DIAGNOSIS — D631 Anemia in chronic kidney disease: Secondary | ICD-10-CM | POA: Diagnosis not present

## 2020-07-04 DIAGNOSIS — N184 Chronic kidney disease, stage 4 (severe): Secondary | ICD-10-CM | POA: Diagnosis not present

## 2020-07-04 LAB — PROTEIN / CREATININE RATIO, URINE
Creatinine, Urine: 15.82 mg/dL
Protein Creatinine Ratio: 0.32 mg/mg{Cre} — ABNORMAL HIGH (ref 0.00–0.15)
Total Protein, Urine: 5 mg/dL

## 2020-07-04 LAB — POCT HEMOGLOBIN-HEMACUE: Hemoglobin: 10 g/dL — ABNORMAL LOW (ref 12.0–15.0)

## 2020-07-04 MED ORDER — EPOETIN ALFA-EPBX 3000 UNIT/ML IJ SOLN: 3000.0000 [IU] | Freq: Once | INTRAMUSCULAR | Status: DC

## 2020-07-12 ENCOUNTER — Other Ambulatory Visit: Payer: Self-pay

## 2020-07-12 ENCOUNTER — Encounter: Payer: Self-pay | Admitting: Gastroenterology

## 2020-07-12 ENCOUNTER — Ambulatory Visit (INDEPENDENT_AMBULATORY_CARE_PROVIDER_SITE_OTHER): Payer: Medicare Other | Admitting: Gastroenterology

## 2020-07-12 VITALS — BP 153/57 | HR 48 | Temp 96.8°F | Ht 65.0 in | Wt 124.8 lb

## 2020-07-12 DIAGNOSIS — K921 Melena: Secondary | ICD-10-CM | POA: Insufficient documentation

## 2020-07-12 DIAGNOSIS — D509 Iron deficiency anemia, unspecified: Secondary | ICD-10-CM | POA: Diagnosis not present

## 2020-07-12 DIAGNOSIS — I255 Ischemic cardiomyopathy: Secondary | ICD-10-CM | POA: Diagnosis not present

## 2020-07-12 DIAGNOSIS — K5900 Constipation, unspecified: Secondary | ICD-10-CM | POA: Diagnosis not present

## 2020-07-12 NOTE — Progress Notes (Signed)
Referring Provider: Glenda Chroman, MD Primary Care Physician:  Glenda Chroman, MD Primary GI Physician: Dr. Gala Romney  Chief Complaint  Patient presents with   Anemia    Doing ok    HPI:   Crystal [name] is an 86 y.o. female presenting today with a history of Multifactorial normocytic anemia in the setting of CKD, IDA component.   She was recently diagnosed with MGUS with concern for possible multiple myeloma as well in April 2022, hematology reached out to [place] in regards to patient's declining hemoglobin, down from 9 in feb 2022 to 7 prior to her April 2022 GI visit. Patient was on PO Iron and eliquis, and reported that dark stools and fatigue began after she had started eliquis. PO Iron and eliquis were stopped and she reported dark stools then subsided.   It was recommended at that time that a capsule study or EGD w/capsule placement be done to r/o UGIB, however, patient declined r/t her ongoing multiple health issues and fatigue.   Melena: Patient reports she is back on eliquis but has since stopped plavix. Denies any melena or hematochezia at this time since stopping plavix.  Anemia: Hemoglobin as of 07/04/20 was 10. Iron studies WNL 06/20/20. Patient was receiving Iron Infusions from nephrology and epogen shots from hematology. Reports no iron infusions or epogen injections since April due to improvement in hgb and iron studies, however she is back on PO Iron as of 05/18/20.  Has follow up appt in July with hematology.   Constipation: reports occasional constipation, once or twice per week, however, typically has a BM daily as long as she eats prunes.   Patient denies  Denies BRBPR or black stools. Denies abdominal pain, dysphagia, odynophagia, early satiety or bloating.  Last Colonoscopy:2019 w/Dr. Anthony Sar  Last Endoscopy: 10/13/19, non obstructing schatzki ring at GE junction, s/p dilation, erosive gastropathy w/ stigmata of recent bleeding, normal duodenum. H. Pylori Negative.    Recommendations:  No need for further screening colonoscopy due to advanced age, unless clinically indicated. EGD capsule placement/capsule study should be done if patient begins having melena again.   Past Medical History:  Diagnosis Date   Anemia    Bell's palsy    Breast cancer ([place]) 1998   Right mastectomy   CAD (coronary artery disease)    a. s/p STEMI in 01/2019 with DES to mid-LAD   CHF (congestive heart failure) ([place])    a. EF 30-35% by echo in 01/2019   Chronic kidney disease    Essential hypertension    GERD (gastroesophageal reflux disease)    Gout    History of skin cancer    Squamous cell, left shoulder   Mixed hyperlipidemia    Osteopenia    Paroxysmal atrial fibrillation ([facility])    Thyroid nodule    Type 2 diabetes mellitus ([place])     Past Surgical History:  Procedure Laterality Date   BIOPSY  10/13/2019   Procedure: BIOPSY;  Surgeon: Daneil Dolin, MD;  [facility];  Service: Endoscopy;;   CATARACT EXTRACTION  2016   COLONOSCOPY  2019   Dr Anthony Sar   CORONARY ANGIOGRAPHY N/A 01/16/2020   Procedure: CORONARY ANGIOGRAPHY;  Surgeon: Jettie Booze, MD;  Location: [facility];  Service: Cardiovascular;  Laterality: N/A;   CORONARY STENT INTERVENTION N/A 01/16/2020   Procedure: CORONARY STENT INTERVENTION;  Surgeon: Jettie Booze, MD;  Location: [facility];  Service: Cardiovascular;  Laterality: N/A;  CORONARY/GRAFT ACUTE MI REVASCULARIZATION N/A 01/30/2019   Procedure: Coronary/Graft Acute MI Revascularization;  Surgeon: Burnell Blanks, MD;  Location: [facility];  Service: Cardiovascular;  Laterality: N/A;   ESOPHAGOGASTRODUODENOSCOPY (EGD) WITH PROPOFOL N/A 10/13/2019   Non-obstructing Schatzki ring at GE junction, s/p dilation, erosive gastropathy with stigmata of recent bleeding, normal duodenum. Negative H.pylori.    HYSTEROSCOPY     INTRAVASCULAR ULTRASOUND/IVUS N/A 01/16/2020   Procedure:  Intravascular Ultrasound/IVUS;  Surgeon: Jettie Booze, MD;  Location: [facility];  Service: Cardiovascular;  Laterality: N/A;   LEFT HEART CATH AND CORONARY ANGIOGRAPHY N/A 01/30/2019   Procedure: LEFT HEART CATH AND CORONARY ANGIOGRAPHY;  Surgeon: Burnell Blanks, MD;  Location: [facility];  Service: Cardiovascular;  Laterality: N/A;   LEFT HEART CATH AND CORONARY ANGIOGRAPHY N/A 01/16/2020   Procedure: LEFT HEART CATH AND CORONARY ANGIOGRAPHY;  Surgeon: Jettie Booze, MD;  Location: [facility];  Service: Cardiovascular;  Laterality: N/A;   MALONEY DILATION N/A 10/13/2019   Procedure: Venia Minks DILATION;  Surgeon: Daneil Dolin, MD;  [facility];  Service: Endoscopy;  Laterality: N/A;   Right mastectomy  1998   [facility]   RIGHT/LEFT HEART CATH AND CORONARY ANGIOGRAPHY N/A 02/13/2020   Procedure: RIGHT/LEFT HEART CATH AND CORONARY ANGIOGRAPHY;  Surgeon: Martinique, Peter M, MD;  Location: [facility];  Service: Cardiovascular;  Laterality: N/A;    Current Outpatient Medications  Medication Sig Dispense Refill   acetaminophen (TYLENOL) 500 MG tablet Take 500 mg by mouth every 6 (six) hours as needed for headache (pain).     amiodarone (PACERONE) 200 MG tablet Take 200 mg by mouth daily.     amLODipine (NORVASC) 2.5 MG tablet Take 1 tablet (2.5 mg total) by mouth daily. 90 tablet 3   Calcium Carbonate-Vitamin D (CALCIUM 500 + D PO) Take 1 tablet by mouth daily before lunch. Citracal plus D3     carvedilol (COREG) 25 MG tablet Take 1 tablet (25 mg total) by mouth in the morning and at bedtime. Breakfast & supper 180 tablet 3   cholecalciferol (VITAMIN D3) 25 MCG (1000 UT) tablet Take 1,000 Units by mouth daily with breakfast.     CINNAMON PO Take 1,000 mg by mouth daily before lunch.      diazepam (VALIUM) 2 MG tablet Take 2 mg by mouth 2 (two) times daily as needed (dizzy spells.).     ELIQUIS 2.5 MG TABS tablet TAKE (1) TABLET TWICE  DAILY. 60 tablet 5   epoetin alfa (EPOGEN) 3000 UNIT/ML injection Inject into the skin.     ferrous sulfate 325 (65 FE) MG tablet Take 325 mg by mouth daily before lunch.     hydrALAZINE (APRESOLINE) 100 MG tablet TAKE 1 TABLET EVERY 8 HOURS 270 tablet 0   isosorbide mononitrate (IMDUR) 120 MG 24 hr tablet TAKE 1 TABLET BY MOUTH AT BEDTIME. 30 tablet 6   loperamide (IMODIUM) 2 MG capsule Take 2-4 mg by mouth 4 (four) times daily as needed for diarrhea or loose stools.     meclizine (ANTIVERT) 25 MG tablet Take 25 mg by mouth 2 (two) times daily as needed for dizziness.     Multiple Vitamin (MULTIVITAMIN WITH MINERALS) TABS tablet Take 1 tablet by mouth daily. Centrum Silver for Women     multivitamin-lutein (OCUVITE-LUTEIN) CAPS capsule Take 1 capsule by mouth daily before lunch.      nitroGLYCERIN (NITROSTAT) 0.4 MG SL tablet Place 1 tablet (  0.4 mg total) under the tongue every 5 (five) minutes x 3 doses as needed for chest pain. 25 tablet 2   Omega-3 Fatty Acids (FISH OIL PO) Take 1,576 mg by mouth daily before lunch.     pantoprazole (PROTONIX) 40 MG tablet Take 1 tablet (40 mg total) by mouth daily. Take 30 minutes before breakfast 90 tablet 3   potassium chloride SA (KLOR-CON) 20 MEQ tablet Take 3 tablets (60 mEq total) by mouth daily. 270 tablet 1   rosuvastatin (CRESTOR) 5 MG tablet Take 0.5 tablets (2.5 mg total) by mouth every other day. 30 tablet 0   saccharomyces boulardii (FLORASTOR) 250 MG capsule Take 250 mg by mouth 2 (two) times daily as needed (diarrhea).     sodium chloride (OCEAN) 0.65 % SOLN nasal spray Place 1 spray into both nostrils 2 (two) times daily as needed for congestion.      torsemide (DEMADEX) 20 MG tablet Take 2 tablets (40 mg total) by mouth daily. 180 tablet 3   No current facility-administered medications for this visit.    Allergies as of 07/12/2020 - Review Complete 07/12/2020  Allergen Reaction Noted   Bactrim [sulfamethoxazole-trimethoprim] Nausea And  Vomiting 06/02/2017   Sulfa antibiotics Nausea And Vomiting 06/02/2017   Amlodipine Other (See Comments) 06/02/2017   Clonidine derivatives Other (See Comments) 06/02/2017   Evista [raloxifene hcl] Other (See Comments) 06/02/2017   Fosamax [alendronate sodium] Other (See Comments) 06/02/2017   Glipizide Other (See Comments) 06/02/2017   Lipitor [atorvastatin calcium] Other (See Comments) 06/02/2017   Losartan Other (See Comments) 06/02/2017   Pravastatin Other (See Comments) 06/02/2017   Azithromycin Rash and Other (See Comments) 06/02/2017    Family History  Problem Relation Age of Onset   Stroke Mother    Heart attack Father    Aneurysm Sister    Heart attack Maternal Uncle    Heart disease Maternal Uncle    Heart attack Paternal Aunt    Heart disease Paternal Aunt    Heart attack Paternal Grandfather    Heart disease Paternal Grandfather    Heart attack Paternal Aunt    Heart disease Paternal Aunt    Heart attack Maternal Uncle    Heart disease Maternal Uncle    Heart attack Maternal Uncle    Heart disease Maternal Uncle    Heart attack Sister    Heart disease Sister    Cancer Sister    Colon cancer Neg Hx    Colon polyps Neg Hx     Social History   Socioeconomic History   Marital status: Divorced    Spouse name: Not on file   Number of children: Not on file   Years of education: Not on file   Highest education level: Not on file  Occupational History   Not on file  Tobacco Use   Smoking status: Never   Smokeless tobacco: Never  Vaping Use   Vaping Use: Never used  Substance and Sexual Activity   Alcohol use: Never   Drug use: Never   Sexual activity: Not on file  Other Topics Concern   Not on file  Social History Narrative   Not on file   Social Determinants of Health   Financial Resource Strain: Low Risk    Difficulty of Paying Living Expenses: Not hard at all  Food Insecurity: No Food Insecurity   Worried About Charity fundraiser in the Last  Year: Never true   [place] in the Last Year:  Never true  Transportation Needs: No Transportation Needs   Lack of Transportation (Medical): No   Lack of Transportation (Non-Medical): No  Physical Activity: Insufficiently Active   Days of Exercise per Week: 1 day   Minutes of Exercise per Session: 20 min  Stress: No Stress Concern Present   Feeling of Stress : Not at all  Social Connections: Moderately Isolated   Frequency of Communication with Friends and Family: More than three times a week   Frequency of Social Gatherings with Friends and Family: More than three times a week   Attends Religious Services: 1 to 4 times per year   Active Member of [employer] or Organizations: No   Attends Archivist Meetings: Never   Marital Status: Divorced    Review of Systems: Gen: Denies fever, chills, anorexia. Denies fatigue, weakness, weight loss.  CV: Denies chest pain, palpitations, syncope, peripheral edema, and claudication. Resp: Denies dyspnea at rest, cough, wheezing, coughing up blood, and pleurisy. GI: Denies vomiting blood, jaundice, and fecal incontinence.   Denies dysphagia or odynophagia. Derm: Denies rash, itching, dry skin Psych: Denies depression, anxiety, memory loss, confusion. No homicidal or suicidal ideation.  Heme: Denies bruising, bleeding, and enlarged lymph nodes.  Physical Exam: BP (!) 153/57   Pulse (!) 48   Temp (!) 96.8 F (36 C) (Temporal)   Ht 5' 5"  (1.651 m)   Wt 124 lb 12.8 oz (56.6 kg)   BMI 20.77 kg/m  General:   Alert and oriented. No distress noted. Pleasant and cooperative.  Head:  Normocephalic and atraumatic. Eyes:  Conjuctiva clear without scleral icterus. Mouth:  Oral mucosa pink and moist. Good dentition. No lesions. Heart: Normal rate and rhythm, s1 and s2 heart sounds present.  Lungs: Clear lung sounds in all lobes. Respirations equal and unlabored. Abdomen:  +BS, soft, non-tender and non-distended. No rebound or guarding. No  HSM or masses noted. Msk:  Symmetrical without gross deformities. Normal posture. Extremities:  Without edema. Neurologic:  Alert and  oriented x4 Psych:  Alert and cooperative. Normal mood and affect.  ASSESSMENT: Crystal [name] is an 86 y.o. female presenting today for follow up of melena. Seen in April after hematology reached out to [place] for evaluation of reported melena and decline in hgb from 9 to 7 after starting PO iron, plavix and eliquis. Patient had stopped eliquis and was placed on IV Iron, she then noted that dark stools had subsided. She has since been placed back on eliquis by cardiology as well as PO Iron, but taken off of plavix. She denies any dark stools or BRBPR at this time. Hemoglobin stable at 10 as of 07/04/20, no recent iron infusions or epogen since April as hgb and iron studies remain stable.   Doing well overall from GI standpoint. Discussed that if melena returns we would recommend further evaluation via capsule study/egd capsule placement, however, patient not really interested in any further testing.    PLAN:  Keep follow up with hematology in July  2.  Continue eating prunes to help with constipation as well as drinking plenty of water and getting adequate intake of fiber. 3. Will discuss capsule study/egd capsule placement again if melena returns.   Follow Up: 6 months.  Chelsea L. Alver Sorrow, MSN, APRN, AGNP-C [facility]    Visit was undertaken and completed in conjunction with Scherrie Gerlach, MSN, APRN, AGNP-C. Agree with above as listed. Notably, patient has had EGD in Sept 20201 with erosive gastropathy and  stigmata of recent bleeding, which could certainly be contributing to occult GI blood loss in setting of anticoagulation/antiplatelet therapy. Colonoscopy last in 2019. Unable to exclude an occult right-sided colonic lesion, but I am more suspicious of UGI/small bowel source. Continue to recommend repeat EGD with capsule placement but  patient declining. If this were negative, would circle back to colonoscopy. Patient continues to decline any endoscopic evaluation. As mentioned previously, as CTE would help assess for any obvious small bowel tumors but not assist with ulcerations, AVMs, etc; she declines further evaluation regardless. At this point, she needs to be followed closely and appreciate Hematology following with infusions as needed. As she is not wanting extensive GI evaluation, we will see her in 6 months. She is fully aware of signs/symptoms to report. Annitta Needs, PhD, ANP-BC [facility]

## 2020-07-12 NOTE — Patient Instructions (Signed)
Keep follow up with hematology in July. Continue eating prunes to help with constipation.  Follow up in 6 months, or sooner if needed.    It was a pleasure providing care for you today!

## 2020-07-18 ENCOUNTER — Encounter (HOSPITAL_COMMUNITY)
Admission: RE | Admit: 2020-07-18 | Discharge: 2020-07-18 | Disposition: A | Discharge status: Home / Self Care | Payer: Medicare Other | Source: Ambulatory Visit | Attending: Nephrology | Admitting: Nephrology

## 2020-07-18 ENCOUNTER — Other Ambulatory Visit: Payer: Self-pay

## 2020-07-18 DIAGNOSIS — D631 Anemia in chronic kidney disease: Secondary | ICD-10-CM | POA: Diagnosis not present

## 2020-07-18 DIAGNOSIS — N184 Chronic kidney disease, stage 4 (severe): Secondary | ICD-10-CM | POA: Diagnosis not present

## 2020-07-18 LAB — POCT HEMOGLOBIN-HEMACUE: Hemoglobin: 9.9 g/dL — ABNORMAL LOW (ref 12.0–15.0)

## 2020-07-18 MED ORDER — EPOETIN ALFA-EPBX 3000 UNIT/ML IJ SOLN
3000.0000 [IU] | Freq: Once | INTRAMUSCULAR | Status: AC
  Administered 2020-07-18: 3000 [IU] via SUBCUTANEOUS
  Filled 2020-07-18: qty 1

## 2020-07-19 ENCOUNTER — Other Ambulatory Visit: Payer: Self-pay | Admitting: *Deleted

## 2020-07-19 NOTE — Patient Outreach (Signed)
Crystal [name]) Care Management  07/19/2020  [name] 1934-05-23 [id number]  Referral for medication assistance from Deloria Lair, NP sent to [facility] [name] Management Assistant [phone]

## 2020-07-19 NOTE — Patient Outreach (Signed)
[facility]) Care Management  [facility] Care Manager  07/19/2020   Crystal [name] 1934/12/02 [id number]  Subjective: Telephone outreach to follow up on HF, CAD, Anemia  Encounter Medications:  Outpatient Encounter Medications as of 07/19/2020  Medication Sig Note   acetaminophen (TYLENOL) 500 MG tablet Take 500 mg by mouth every 6 (six) hours as needed for headache (pain).    amiodarone (PACERONE) 200 MG tablet Take 200 mg by mouth daily.    amLODipine (NORVASC) 2.5 MG tablet Take 1 tablet (2.5 mg total) by mouth daily.    Calcium Carbonate-Vitamin D (CALCIUM 500 + D PO) Take 1 tablet by mouth daily before lunch. Citracal plus D3    carvedilol (COREG) 25 MG tablet Take 1 tablet (25 mg total) by mouth in the morning and at bedtime. Breakfast & supper    cholecalciferol (VITAMIN D3) 25 MCG (1000 UT) tablet Take 1,000 Units by mouth daily with breakfast.    CINNAMON PO Take 1,000 mg by mouth daily before lunch.     diazepam (VALIUM) 2 MG tablet Take 2 mg by mouth 2 (two) times daily as needed (dizzy spells.).    ELIQUIS 2.5 MG TABS tablet TAKE (1) TABLET TWICE DAILY.    epoetin alfa (EPOGEN) 3000 UNIT/ML injection Inject into the skin.    ferrous sulfate 325 (65 FE) MG tablet Take 325 mg by mouth daily before lunch. 05/18/2020: HAS RESTARTED ON 05/18/20   hydrALAZINE (APRESOLINE) 100 MG tablet TAKE 1 TABLET EVERY 8 HOURS    isosorbide mononitrate (IMDUR) 120 MG 24 hr tablet TAKE 1 TABLET BY MOUTH AT BEDTIME.    loperamide (IMODIUM) 2 MG capsule Take 2-4 mg by mouth 4 (four) times daily as needed for diarrhea or loose stools.    meclizine (ANTIVERT) 25 MG tablet Take 25 mg by mouth 2 (two) times daily as needed for dizziness.    Multiple Vitamin (MULTIVITAMIN WITH MINERALS) TABS tablet Take 1 tablet by mouth daily. Centrum Silver for Women    multivitamin-lutein (OCUVITE-LUTEIN) CAPS capsule Take 1 capsule by mouth daily before lunch.     nitroGLYCERIN (NITROSTAT) 0.4 MG SL  tablet Place 1 tablet (0.4 mg total) under the tongue every 5 (five) minutes x 3 doses as needed for chest pain.    Omega-3 Fatty Acids (FISH OIL PO) Take 1,576 mg by mouth daily before lunch.    pantoprazole (PROTONIX) 40 MG tablet Take 1 tablet (40 mg total) by mouth daily. Take 30 minutes before breakfast    potassium chloride SA (KLOR-CON) 20 MEQ tablet Take 3 tablets (60 mEq total) by mouth daily.    rosuvastatin (CRESTOR) 5 MG tablet Take 0.5 tablets (2.5 mg total) by mouth every other day.    saccharomyces boulardii (FLORASTOR) 250 MG capsule Take 250 mg by mouth 2 (two) times daily as needed (diarrhea).    sodium chloride (OCEAN) 0.65 % SOLN nasal spray Place 1 spray into both nostrils 2 (two) times daily as needed for congestion.     torsemide (DEMADEX) 20 MG tablet Take 2 tablets (40 mg total) by mouth daily.    No facility-administered encounter medications on file as of 07/19/2020.    Functional Status:  In your present state of health, do you have any difficulty performing the following activities: 04/12/2020 02/13/2020  Hearing? - N  Vision? - N  Difficulty concentrating or making decisions? - N  Walking or climbing stairs? - Y  Dressing or bathing? - N  Doing errands, shopping? - Y  Preparing Food and eating ? N -  Comment Late entry for 02/03/20. CCS -  Using the Toilet? N -  Comment Late entry for 02/03/20. CCS -  In the past six months, have you accidently leaked urine? Y -  Comment Late entry for 02/03/20. CCS -  Do you have problems with loss of bowel control? N -  Comment Late entry for 02/03/20. CCS -  Managing your Medications? N -  Comment Late entry for 02/03/20. CCS -  Managing your Finances? N -  Comment Late entry for 02/03/20. CCS -  Housekeeping or managing your Housekeeping? Y -  Comment Late entry for 02/03/20. CCS -  Some recent data might be hidden    Fall/Depression Screening: Fall Risk  04/12/2020 03/04/2019  Falls in the past year? 0 0  Comment Late entry for  02/03/20. CCS -  Number falls in past yr: 0 -  Comment Late entry for 02/03/20. CCS -  Injury with Fall? 0 -  Comment Late entry for 02/03/20. CCS -  Risk for fall due to : Medication side effect -  Risk for fall due to: Comment Late entry for 02/03/20. CCS -  Follow up Falls evaluation completed -  Comment Late entry for 02/03/20. CCS -   PHQ 2/9 Scores 05/01/2020 04/12/2020 03/04/2019  PHQ - 2 Score 0 2 0  PHQ- 9 Score - 4 -    Assessment: HF stable                        CAD                        Anemia                        CKD  Care Plan   Goals Addressed               This Visit's Progress     Patient Stated     [facility]) Patient Stated (pt-stated)   On track     Pt will call MD or NP if has increasing sxs HF (wt. Gain, wheezing, SOB, Edema) to overt an ED visit over the next 3 months.  Start date 10/23/20, renewed on 03/07/20., renewed 06/18/20  High Priority Long term goal Expected end date: Follow up 09/25/20  Barriers: Health Behaviors Knowledge  03/07/20 Pt has followed her goal but had to go to the ED and be hospitalized for subsequent MI. Reinforced HF and CAD Action plan to weigh daily, take meds, activity to tolerance, go to appts and report any heart sxs early to MD to prevent complications and subsequent trips to the hospital. 03/14/20 Following HF Action plan. Wt is stable, no signs of HF. Pt acknowledges she knows when to call for advice. She will have video visit with cardiology tomorrow She feels she is making progress, striving to get her independence back. She feels she is less SOB and it is evident in talking with her, she is less SOB when talking. Encouraged her to give all her effort in her therapy sessions to progress and to be safe. 03/21/20 No acute probems, progressing nicely. Encouraged continued efforts, attending appts, following medical recommendations. Celebrated that she is happy with her progress and that she IS getting better. 03/28/20 Pt reports continueous  improvements in her phycial recovery. No HF exacerbating sxs reported. Knows when to call for help. Reinforced Action Plan.  04/04/20 Wt stable between 127-129#, no SOB and minimal edema. General overall health improvement. Praised for her continued self managed and encouraged to continue this life long regimen. 05/03/20 Wt 129#. Has some SOB on exertion but note her Hgb has fallen to 7.0! She is being worked up by hematology, has had a bone scan. She has had a couple of dark stools and has reported this to her GI provider. Dr. Cyndi Bender is trying to get her in the office ASAP. She has moved back to her own home. She is able to get around cautiously using her walker. They kids are checking in on her daily. She carries her cell phone with her at all times to call for help if she needs to. 05/18/20 Has not needed to call for any new significant sxs. Today's wt was 130#. She saw Dr. Domenic Polite this week and he did increase her demadex dose to 40 mg for 3 days the to alternating days of 20 mg and 40 mg. Her niece is assisting her with medication organization for bid dosing. This works very well for her. She will f/u with him in May. Her hgb is up to 8.1, steadily increasing. She has resumed her eliquis and iron supplementation. She has seen her GI specialist. 06/18/20 Today's wt is 120# (10# wt loss) Demadex has been increased to 40 mg daily continuously now. Reinforced low salt diet, especially when eating out. Ask for low Na and heart healthy choices. Dr. Domenic Polite took her off of Plavix only on Eliquis now. 07/19/20 Wt is 122.3, no SOB, does have some edema but this resolves during the night. She continues to take 40 mg of toresemide daily. Her potassium was increased to 60 meq in May when her potassium level was 3.7 and has not been rechecked. Will send Dr. Theador Hawthorne a note asking if he would like to get that rechecked before he sees her again 08/15/20. Reinforced to call if any problems arise.       Other     Comorbidities  Identified and Managed and documented with pt self management reported each month over the next 3 months.   On track     Pt goal started: 04/04/20 Long term goal High priority Follow up 08/20/20 Expected completion; 07/26/20, Renewed for another 3 months 10/26/20      Notes: 04/04/20 Other comorbidities to monitor: DM, CKD, CAD. Chart review revealed Diabetes: Lipids were normal in January! HgbA1C was 6.0 in December! Does not check her glucose at home very often as this is well controlled. Reports she had a foot exam last fall, will have her eye exam: tomorrow.  CKD: labs ordered by nephrology, will get labs tomorrow and pick up jug for 24 hour urine collection. Next appt is last week of Marchl for follow up and discussion regarding lab results. No new CAD sxs and follow up with cardiology scheduled for May. 05/18/20 Labs recently done. CBC improving. Labs ordered by hematologist are mostly out of range. Dr. Leron Croak is following closely. Pt is getting iron infusions. Reviewed safety measures since pt is back at her home. She is getting around better all the time and she is starting to drive again and feels confident. She is extremely cautious when ambulating and avoids taking risks like stepping up on a curb. She will go up the curb cutout. Reinforced safety as #1 and notifying MD of any problems early. 06/18/20 Pt is feeling the best she has felt, HF more stable, CKD also stable back to  stage III. She is able to do her housework slowly as she tires easily. She says she cannot bend over and clean her bath tub. Suggested she use a mop and that way she doesn't need to bend over. Her thyroid levels were recently checked and that is normal range. 07/19/20 Saw GI and she is stable, no dx scheduled. 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 Advised she should hold pressure over areas she knows she has bumped to minimize the superficial bruising. Suggested we request an  application for pharmacy assistance for the Eliquis and she is in agreement. Will refer. Encouraged to call for any issues for prompt intervention and to avoid complications. She did have an iron infusion yesterday as her hgb was 9.9.          Plan: Will refer for pharmacy assistance.           Will ask Dr. Theador Hawthorne if potassium level should be rechecked now. Follow-up: Patient agrees to Care Plan and Follow-up. Follow-up in 1 month(s)  Crystal [name]. [name], MSN, [facility] Gerontological Nurse Practitioner [facility] Care Management [phone]

## 2020-07-25 DIAGNOSIS — I5022 Chronic systolic (congestive) heart failure: Secondary | ICD-10-CM | POA: Diagnosis not present

## 2020-07-25 DIAGNOSIS — Z299 Encounter for prophylactic measures, unspecified: Secondary | ICD-10-CM | POA: Diagnosis not present

## 2020-07-25 DIAGNOSIS — I700 Atherosclerosis of aorta: Secondary | ICD-10-CM | POA: Diagnosis not present

## 2020-07-25 DIAGNOSIS — E1165 Type 2 diabetes mellitus with hyperglycemia: Secondary | ICD-10-CM | POA: Diagnosis not present

## 2020-07-25 DIAGNOSIS — I4891 Unspecified atrial fibrillation: Secondary | ICD-10-CM | POA: Diagnosis not present

## 2020-07-25 DIAGNOSIS — I10 Essential (primary) hypertension: Secondary | ICD-10-CM | POA: Diagnosis not present

## 2020-07-31 DIAGNOSIS — R109 Unspecified abdominal pain: Secondary | ICD-10-CM | POA: Diagnosis not present

## 2020-07-31 DIAGNOSIS — R1084 Generalized abdominal pain: Secondary | ICD-10-CM | POA: Diagnosis not present

## 2020-07-31 DIAGNOSIS — R140 Abdominal distension (gaseous): Secondary | ICD-10-CM | POA: Diagnosis not present

## 2020-07-31 DIAGNOSIS — Z299 Encounter for prophylactic measures, unspecified: Secondary | ICD-10-CM | POA: Diagnosis not present

## 2020-07-31 DIAGNOSIS — I10 Essential (primary) hypertension: Secondary | ICD-10-CM | POA: Diagnosis not present

## 2020-08-01 ENCOUNTER — Encounter (HOSPITAL_COMMUNITY): Payer: Self-pay | Admitting: Student

## 2020-08-01 ENCOUNTER — Other Ambulatory Visit: Payer: Self-pay

## 2020-08-01 ENCOUNTER — Emergency Department (HOSPITAL_COMMUNITY): Payer: Medicare Other

## 2020-08-01 ENCOUNTER — Inpatient Hospital Stay (HOSPITAL_COMMUNITY)
Admission: EM | Admit: 2020-08-01 | Discharge: 2020-08-10 | DRG: 339 | Disposition: A | Discharge status: Home Health | Payer: Medicare Other | Attending: Internal Medicine | Admitting: Internal Medicine

## 2020-08-01 DIAGNOSIS — E1122 Type 2 diabetes mellitus with diabetic chronic kidney disease: Secondary | ICD-10-CM | POA: Diagnosis present

## 2020-08-01 DIAGNOSIS — Z5321 Procedure and treatment not carried out due to patient leaving prior to being seen by health care provider: Secondary | ICD-10-CM | POA: Diagnosis not present

## 2020-08-01 DIAGNOSIS — E876 Hypokalemia: Secondary | ICD-10-CM

## 2020-08-01 DIAGNOSIS — K219 Gastro-esophageal reflux disease without esophagitis: Secondary | ICD-10-CM | POA: Diagnosis present

## 2020-08-01 DIAGNOSIS — R339 Retention of urine, unspecified: Secondary | ICD-10-CM | POA: Diagnosis present

## 2020-08-01 DIAGNOSIS — E11649 Type 2 diabetes mellitus with hypoglycemia without coma: Secondary | ICD-10-CM | POA: Diagnosis not present

## 2020-08-01 DIAGNOSIS — Z853 Personal history of malignant neoplasm of breast: Secondary | ICD-10-CM

## 2020-08-01 DIAGNOSIS — E871 Hypo-osmolality and hyponatremia: Secondary | ICD-10-CM | POA: Diagnosis not present

## 2020-08-01 DIAGNOSIS — R188 Other ascites: Secondary | ICD-10-CM | POA: Diagnosis not present

## 2020-08-01 DIAGNOSIS — I081 Rheumatic disorders of both mitral and tricuspid valves: Secondary | ICD-10-CM | POA: Diagnosis present

## 2020-08-01 DIAGNOSIS — I5042 Chronic combined systolic (congestive) and diastolic (congestive) heart failure: Secondary | ICD-10-CM | POA: Diagnosis present

## 2020-08-01 DIAGNOSIS — Z79899 Other long term (current) drug therapy: Secondary | ICD-10-CM

## 2020-08-01 DIAGNOSIS — Z823 Family history of stroke: Secondary | ICD-10-CM

## 2020-08-01 DIAGNOSIS — R110 Nausea: Secondary | ICD-10-CM | POA: Diagnosis not present

## 2020-08-01 DIAGNOSIS — R001 Bradycardia, unspecified: Secondary | ICD-10-CM | POA: Diagnosis present

## 2020-08-01 DIAGNOSIS — Z85828 Personal history of other malignant neoplasm of skin: Secondary | ICD-10-CM

## 2020-08-01 DIAGNOSIS — K5900 Constipation, unspecified: Secondary | ICD-10-CM | POA: Diagnosis not present

## 2020-08-01 DIAGNOSIS — Z8249 Family history of ischemic heart disease and other diseases of the circulatory system: Secondary | ICD-10-CM

## 2020-08-01 DIAGNOSIS — I2510 Atherosclerotic heart disease of native coronary artery without angina pectoris: Secondary | ICD-10-CM | POA: Diagnosis present

## 2020-08-01 DIAGNOSIS — I130 Hypertensive heart and chronic kidney disease with heart failure and stage 1 through stage 4 chronic kidney disease, or unspecified chronic kidney disease: Secondary | ICD-10-CM | POA: Diagnosis present

## 2020-08-01 DIAGNOSIS — Z881 Allergy status to other antibiotic agents status: Secondary | ICD-10-CM

## 2020-08-01 DIAGNOSIS — K567 Ileus, unspecified: Secondary | ICD-10-CM | POA: Diagnosis present

## 2020-08-01 DIAGNOSIS — Z4659 Encounter for fitting and adjustment of other gastrointestinal appliance and device: Secondary | ICD-10-CM

## 2020-08-01 DIAGNOSIS — Z7901 Long term (current) use of anticoagulants: Secondary | ICD-10-CM

## 2020-08-01 DIAGNOSIS — I701 Atherosclerosis of renal artery: Secondary | ICD-10-CM | POA: Diagnosis present

## 2020-08-01 DIAGNOSIS — K660 Peritoneal adhesions (postprocedural) (postinfection): Secondary | ICD-10-CM | POA: Diagnosis present

## 2020-08-01 DIAGNOSIS — Z20822 Contact with and (suspected) exposure to covid-19: Secondary | ICD-10-CM | POA: Diagnosis present

## 2020-08-01 DIAGNOSIS — J811 Chronic pulmonary edema: Secondary | ICD-10-CM

## 2020-08-01 DIAGNOSIS — Z882 Allergy status to sulfonamides status: Secondary | ICD-10-CM

## 2020-08-01 DIAGNOSIS — I252 Old myocardial infarction: Secondary | ICD-10-CM

## 2020-08-01 DIAGNOSIS — K5730 Diverticulosis of large intestine without perforation or abscess without bleeding: Secondary | ICD-10-CM | POA: Diagnosis present

## 2020-08-01 DIAGNOSIS — K3532 Acute appendicitis with perforation and localized peritonitis, without abscess: Principal | ICD-10-CM | POA: Diagnosis present

## 2020-08-01 DIAGNOSIS — E785 Hyperlipidemia, unspecified: Secondary | ICD-10-CM | POA: Diagnosis present

## 2020-08-01 DIAGNOSIS — N184 Chronic kidney disease, stage 4 (severe): Secondary | ICD-10-CM | POA: Diagnosis present

## 2020-08-01 DIAGNOSIS — K56699 Other intestinal obstruction unspecified as to partial versus complete obstruction: Secondary | ICD-10-CM | POA: Diagnosis not present

## 2020-08-01 DIAGNOSIS — Z888 Allergy status to other drugs, medicaments and biological substances status: Secondary | ICD-10-CM

## 2020-08-01 DIAGNOSIS — D509 Iron deficiency anemia, unspecified: Secondary | ICD-10-CM | POA: Diagnosis present

## 2020-08-01 DIAGNOSIS — I480 Paroxysmal atrial fibrillation: Secondary | ICD-10-CM | POA: Diagnosis present

## 2020-08-01 DIAGNOSIS — K6389 Other specified diseases of intestine: Secondary | ICD-10-CM | POA: Diagnosis not present

## 2020-08-01 DIAGNOSIS — E782 Mixed hyperlipidemia: Secondary | ICD-10-CM | POA: Diagnosis present

## 2020-08-01 DIAGNOSIS — Z955 Presence of coronary angioplasty implant and graft: Secondary | ICD-10-CM

## 2020-08-01 DIAGNOSIS — R109 Unspecified abdominal pain: Secondary | ICD-10-CM | POA: Diagnosis not present

## 2020-08-01 DIAGNOSIS — K352 Acute appendicitis with generalized peritonitis, without abscess: Principal | ICD-10-CM | POA: Diagnosis present

## 2020-08-01 DIAGNOSIS — N179 Acute kidney failure, unspecified: Secondary | ICD-10-CM

## 2020-08-01 DIAGNOSIS — Z9011 Acquired absence of right breast and nipple: Secondary | ICD-10-CM

## 2020-08-01 DIAGNOSIS — M109 Gout, unspecified: Secondary | ICD-10-CM | POA: Diagnosis present

## 2020-08-01 DIAGNOSIS — E119 Type 2 diabetes mellitus without complications: Secondary | ICD-10-CM

## 2020-08-01 LAB — CBC WITH DIFFERENTIAL/PLATELET
Band Neutrophils: 14 %
Basophils Absolute: 0 10*3/uL (ref 0.0–0.1)
Basophils Relative: 0 %
Eosinophils Absolute: 0 10*3/uL (ref 0.0–0.5)
Eosinophils Relative: 0 %
HCT: 33.6 % — ABNORMAL LOW (ref 36.0–46.0)
Hemoglobin: 11.1 g/dL — ABNORMAL LOW (ref 12.0–15.0)
Lymphocytes Relative: 3 %
Lymphs Abs: 0.3 10*3/uL — ABNORMAL LOW (ref 0.7–4.0)
MCH: 28.3 pg (ref 26.0–34.0)
MCHC: 33 g/dL (ref 30.0–36.0)
MCV: 85.7 fL (ref 80.0–100.0)
Metamyelocytes Relative: 1 %
Monocytes Absolute: 0.5 10*3/uL (ref 0.1–1.0)
Monocytes Relative: 6 %
Neutro Abs: 7.7 10*3/uL (ref 1.7–7.7)
Neutrophils Relative %: 76 %
Platelets: 170 10*3/uL (ref 150–400)
RBC: 3.92 MIL/uL (ref 3.87–5.11)
RDW: 17.2 % — ABNORMAL HIGH (ref 11.5–15.5)
WBC: 8.5 10*3/uL (ref 4.0–10.5)
nRBC: 0 % (ref 0.0–0.2)

## 2020-08-01 LAB — COMPREHENSIVE METABOLIC PANEL
ALT: 43 U/L (ref 0–44)
AST: 50 U/L — ABNORMAL HIGH (ref 15–41)
Albumin: 3.6 g/dL (ref 3.5–5.0)
Alkaline Phosphatase: 66 U/L (ref 38–126)
Anion gap: 12 (ref 5–15)
BUN: 69 mg/dL — ABNORMAL HIGH (ref 8–23)
CO2: 21 mmol/L — ABNORMAL LOW (ref 22–32)
Calcium: 9 mg/dL (ref 8.9–10.3)
Chloride: 94 mmol/L — ABNORMAL LOW (ref 98–111)
Creatinine, Ser: 3.57 mg/dL — ABNORMAL HIGH (ref 0.44–1.00)
GFR, Estimated: 12 mL/min — ABNORMAL LOW (ref >60)
Glucose, Bld: 106 mg/dL — ABNORMAL HIGH (ref 70–99)
Potassium: 4.6 mmol/L (ref 3.5–5.1)
Sodium: 127 mmol/L — ABNORMAL LOW (ref 135–145)
Total Bilirubin: 1.2 mg/dL (ref 0.3–1.2)
Total Protein: 6.7 g/dL (ref 6.5–8.1)

## 2020-08-01 LAB — RESP PANEL BY RT-PCR (FLU A&B, COVID) ARPGX2
Influenza A by PCR: NEGATIVE
Influenza B by PCR: NEGATIVE
SARS Coronavirus 2 by RT PCR: NEGATIVE

## 2020-08-01 LAB — LIPASE, BLOOD: Lipase: 21 U/L (ref 11–51)

## 2020-08-01 MED ORDER — SODIUM CHLORIDE 0.9 % IV SOLN: INTRAVENOUS | Status: DC

## 2020-08-01 MED ORDER — ACETAMINOPHEN 325 MG PO TABS
650.0000 mg | ORAL_TABLET | Freq: Four times a day (QID) | ORAL | Status: DC | PRN
  Administered 2020-08-02 – 2020-08-09 (×4): 650 mg via ORAL
  Filled 2020-08-01 (×7): qty 2

## 2020-08-01 MED ORDER — FENTANYL CITRATE (PF) 100 MCG/2ML IJ SOLN
25.0000 ug | INTRAMUSCULAR | Status: AC | PRN
  Administered 2020-08-01 (×2): 25 ug via INTRAVENOUS
  Filled 2020-08-01 (×2): qty 2

## 2020-08-01 MED ORDER — ONDANSETRON HCL 4 MG/2ML IJ SOLN
4.0000 mg | Freq: Once | INTRAMUSCULAR | Status: AC
  Administered 2020-08-01: 4 mg via INTRAVENOUS
  Filled 2020-08-01: qty 2

## 2020-08-01 MED ORDER — CIPROFLOXACIN IN D5W 400 MG/200ML IV SOLN
400.0000 mg | Freq: Once | INTRAVENOUS | Status: AC
  Administered 2020-08-01: 400 mg via INTRAVENOUS
  Filled 2020-08-01: qty 200

## 2020-08-01 MED ORDER — PANTOPRAZOLE SODIUM 40 MG IV SOLR
40.0000 mg | INTRAVENOUS | Status: DC
  Administered 2020-08-01 – 2020-08-05 (×5): 40 mg via INTRAVENOUS
  Filled 2020-08-01 (×5): qty 40

## 2020-08-01 MED ORDER — ACETAMINOPHEN 650 MG RE SUPP
650.0000 mg | Freq: Four times a day (QID) | RECTAL | Status: DC | PRN
  Administered 2020-08-02 – 2020-08-04 (×2): 650 mg via RECTAL
  Filled 2020-08-01 (×2): qty 1

## 2020-08-01 MED ORDER — ONDANSETRON HCL 4 MG/2ML IJ SOLN
4.0000 mg | Freq: Four times a day (QID) | INTRAMUSCULAR | Status: DC | PRN
  Administered 2020-08-02 – 2020-08-04 (×3): 4 mg via INTRAVENOUS
  Filled 2020-08-01 (×3): qty 2

## 2020-08-01 MED ORDER — SODIUM CHLORIDE 0.9 % IV BOLUS
500.0000 mL | Freq: Once | INTRAVENOUS | Status: AC
  Administered 2020-08-01: 500 mL via INTRAVENOUS

## 2020-08-01 MED ORDER — CIPROFLOXACIN IN D5W 400 MG/200ML IV SOLN
400.0000 mg | Freq: Two times a day (BID) | INTRAVENOUS | Status: DC
  Administered 2020-08-02 – 2020-08-03 (×3): 400 mg via INTRAVENOUS
  Filled 2020-08-01 (×3): qty 200

## 2020-08-01 MED ORDER — ONDANSETRON HCL 4 MG PO TABS
4.0000 mg | ORAL_TABLET | Freq: Four times a day (QID) | ORAL | Status: DC | PRN
  Administered 2020-08-02 – 2020-08-05 (×2): 4 mg via ORAL
  Filled 2020-08-01 (×2): qty 1

## 2020-08-01 MED ORDER — METRONIDAZOLE 500 MG/100ML IV SOLN
500.0000 mg | Freq: Three times a day (TID) | INTRAVENOUS | Status: DC
  Administered 2020-08-02 – 2020-08-09 (×22): 500 mg via INTRAVENOUS
  Filled 2020-08-01 (×22): qty 100

## 2020-08-01 MED ORDER — METRONIDAZOLE 500 MG/100ML IV SOLN
500.0000 mg | Freq: Once | INTRAVENOUS | Status: AC
  Administered 2020-08-01: 500 mg via INTRAVENOUS
  Filled 2020-08-01: qty 100

## 2020-08-01 MED ORDER — HYDRALAZINE HCL 20 MG/ML IJ SOLN: 10.0000 mg | INTRAMUSCULAR | Status: DC | PRN

## 2020-08-01 NOTE — Consult Note (Signed)
Reason for Consult: Abdominal distention, question early appendicitis Referring Physician: Dr. Felecia Shelling is an 86 y.o. female.  HPI: Patient is an 86 year old white female with multiple medical problems who several days ago started experiencing nonspecific abdominal pain.  She did say she was getting bloated.  She last had a bowel movement 24 hours ago.  She last passed flatus yesterday.  She saw her primary care physician and a regular KUB revealed some dilated small bowel.  She presented to the emergency room today due to worsening abdominal distention and pain.  She states she has had dry heaves.  She has not had frank emesis.  She did take her Eliquis today.  She is on blood thinner for intermittent A. fib.  She has a history of coronary artery disease and congestive heart failure.  She has never had significant abdominal surgery.  Past Medical History:  Diagnosis Date   Anemia    Bell's palsy    Breast cancer ([place]) 1998   Right mastectomy   CAD (coronary artery disease)    a. s/p STEMI in 01/2019 with DES to mid-LAD   CHF (congestive heart failure) ([place])    a. EF 30-35% by echo in 01/2019   Chronic kidney disease    Essential hypertension    GERD (gastroesophageal reflux disease)    Gout    History of skin cancer    Squamous cell, left shoulder   Mixed hyperlipidemia    Osteopenia    Paroxysmal atrial fibrillation (H[facility])    Thyroid nodule    Type 2 diabetes mellitus ([place])     Past Surgical History:  Procedure Laterality Date   BIOPSY  10/13/2019   Procedure: BIOPSY;  Surgeon: Daneil Dolin, MD;  [facility];  Service: Endoscopy;;   CATARACT EXTRACTION  2016   COLONOSCOPY  2019   Dr Anthony Sar   CORONARY ANGIOGRAPHY N/A 01/16/2020   Procedure: CORONARY ANGIOGRAPHY;  Surgeon: Jettie Booze, MD;  Location: [facility];  Service: Cardiovascular;  Laterality: N/A;   CORONARY STENT INTERVENTION N/A 01/16/2020   Procedure: CORONARY STENT  INTERVENTION;  Surgeon: Jettie Booze, MD;  Location: [facility];  Service: Cardiovascular;  Laterality: N/A;   CORONARY/GRAFT ACUTE MI REVASCULARIZATION N/A 01/30/2019   Procedure: Coronary/Graft Acute MI Revascularization;  Surgeon: Burnell Blanks, MD;  Location: [facility];  Service: Cardiovascular;  Laterality: N/A;   ESOPHAGOGASTRODUODENOSCOPY (EGD) WITH PROPOFOL N/A 10/13/2019   Non-obstructing Schatzki ring at GE junction, s/p dilation, erosive gastropathy with stigmata of recent bleeding, normal duodenum. Negative H.pylori.    HYSTEROSCOPY     INTRAVASCULAR ULTRASOUND/IVUS N/A 01/16/2020   Procedure: Intravascular Ultrasound/IVUS;  Surgeon: Jettie Booze, MD;  Location: [facility];  Service: Cardiovascular;  Laterality: N/A;   LEFT HEART CATH AND CORONARY ANGIOGRAPHY N/A 01/30/2019   Procedure: LEFT HEART CATH AND CORONARY ANGIOGRAPHY;  Surgeon: Burnell Blanks, MD;  Location: [facility];  Service: Cardiovascular;  Laterality: N/A;   LEFT HEART CATH AND CORONARY ANGIOGRAPHY N/A 01/16/2020   Procedure: LEFT HEART CATH AND CORONARY ANGIOGRAPHY;  Surgeon: Jettie Booze, MD;  Location: [facility];  Service: Cardiovascular;  Laterality: N/A;   MALONEY DILATION N/A 10/13/2019   Procedure: Venia Minks DILATION;  Surgeon: Daneil Dolin, MD;  [facility];  Service: Endoscopy;  Laterality: N/A;   Right mastectomy  1998   [facility]   RIGHT/LEFT HEART CATH AND CORONARY ANGIOGRAPHY  N/A 02/13/2020   Procedure: RIGHT/LEFT HEART CATH AND CORONARY ANGIOGRAPHY;  Surgeon: Martinique, Peter M, MD;  Location: [facility];  Service: Cardiovascular;  Laterality: N/A;    Family History  Problem Relation Age of Onset   Stroke Mother    Heart attack Father    Aneurysm Sister    Heart attack Maternal Uncle    Heart disease Maternal Uncle    Heart attack Paternal Aunt    Heart disease Paternal Aunt    Heart attack Paternal  Grandfather    Heart disease Paternal Grandfather    Heart attack Paternal Aunt    Heart disease Paternal Aunt    Heart attack Maternal Uncle    Heart disease Maternal Uncle    Heart attack Maternal Uncle    Heart disease Maternal Uncle    Heart attack Sister    Heart disease Sister    Cancer Sister    Colon cancer Neg Hx    Colon polyps Neg Hx     Social History:  reports that she has never smoked. She has never used smokeless tobacco. She reports that she does not drink alcohol and does not use drugs.  Allergies:  Allergies  Allergen Reactions   Bactrim [Sulfamethoxazole-Trimethoprim] Nausea And Vomiting   Sulfa Antibiotics Nausea And Vomiting   Amlodipine Other (See Comments)    EDEMA   Clonidine Derivatives Other (See Comments)    FATIGUE   Evista [Raloxifene Hcl] Other (See Comments)    GERD   Fosamax [Alendronate Sodium] Other (See Comments)    INCREASED GERD    Glipizide Other (See Comments)    PALPITATIONS   Lipitor [Atorvastatin Calcium] Other (See Comments)    ACHING   Losartan Other (See Comments)    FATIGUE    Pravastatin Other (See Comments)    ACHING   Azithromycin Rash and Other (See Comments)    FACIAL BURNING     Medications: I have reviewed the patient's current medications. Prior to Admission: (Not in a hospital admission)   Results for orders placed or performed during the hospital encounter of 08/01/20 (from the past 48 hour(s))  CBC with Differential     Status: Abnormal   Collection Time: 08/01/20  3:08 PM  Result Value Ref Range   WBC 8.5 4.0 - 10.5 K/uL   RBC 3.92 3.87 - 5.11 MIL/uL   Hemoglobin 11.1 (L) 12.0 - 15.0 g/dL   HCT 33.6 (L) 36.0 - 46.0 %   MCV 85.7 80.0 - 100.0 fL   MCH 28.3 26.0 - 34.0 pg   MCHC 33.0 30.0 - 36.0 g/dL   RDW 17.2 (H) 11.5 - 15.5 %   Platelets 170 150 - 400 K/uL   nRBC 0.0 0.0 - 0.2 %   Neutrophils Relative % 76 %   Neutro Abs 7.7 1.7 - 7.7 K/uL   Band Neutrophils 14 %   Lymphocytes Relative 3 %    Lymphs Abs 0.3 (L) 0.7 - 4.0 K/uL   Monocytes Relative 6 %   Monocytes Absolute 0.5 0.1 - 1.0 K/uL   Eosinophils Relative 0 %   Eosinophils Absolute 0.0 0.0 - 0.5 K/uL   Basophils Relative 0 %   Basophils Absolute 0.0 0.0 - 0.1 K/uL   WBC Morphology DOHLE BODIES     Comment: VACUOLATED NEUTROPHILS   Metamyelocytes Relative 1 %    Comment: Performed at [facility], [address] [place], [place] [address]  Comprehensive metabolic panel     Status: Abnormal  Collection Time: 08/01/20  3:08 PM  Result Value Ref Range   Sodium 127 (L) 135 - 145 mmol/L   Potassium 4.6 3.5 - 5.1 mmol/L   Chloride 94 (L) 98 - 111 mmol/L   CO2 21 (L) 22 - 32 mmol/L   Glucose, Bld 106 (H) 70 - 99 mg/dL    Comment: Glucose reference range applies only to samples taken after fasting for at least 8 hours.   BUN 69 (H) 8 - 23 mg/dL   Creatinine, Ser 3.57 (H) 0.44 - 1.00 mg/dL   Calcium 9.0 8.9 - 10.3 mg/dL   Total Protein 6.7 6.5 - 8.1 g/dL   Albumin 3.6 3.5 - 5.0 g/dL   AST 50 (H) 15 - 41 U/L   ALT 43 0 - 44 U/L   Alkaline Phosphatase 66 38 - 126 U/L   Total Bilirubin 1.2 0.3 - 1.2 mg/dL   GFR, Estimated 12 (L) >60 mL/min    Comment: (NOTE) Calculated using the CKD-EPI Creatinine Equation (2021)    Anion gap 12 5 - 15    Comment: Performed at [facility], [address] [place], [place] [address]  Lipase, blood     Status: None   Collection Time: 08/01/20  3:08 PM  Result Value Ref Range   Lipase 21 11 - 51 U/L    Comment: Performed at [facility], [address], [place], [place] [address]  Resp Panel by RT-PCR (Flu A&B, Covid) Nasopharyngeal Swab     Status: None   Collection Time: 08/01/20  8:03 PM   Specimen: Nasopharyngeal Swab; Nasopharyngeal(NP) swabs in vial transport medium  Result Value Ref Range   SARS Coronavirus 2 by RT PCR NEGATIVE NEGATIVE    Comment: (NOTE) SARS-CoV-2 target nucleic acids are NOT DETECTED.  The SARS-CoV-2 RNA is generally detectable in upper  respiratory specimens during the acute phase of infection. The lowest concentration of SARS-CoV-2 viral copies this assay can detect is 138 copies/mL. A negative result does not preclude SARS-Cov-2 infection and should not be used as the sole basis for treatment or other patient management decisions. A negative result may occur with  improper specimen collection/handling, submission of specimen other than nasopharyngeal swab, presence of viral mutation(s) within the areas targeted by this assay, and inadequate number of viral copies(<138 copies/mL). A negative result must be combined with clinical observations, patient history, and epidemiological information. The expected result is Negative.  Fact Sheet for Patients:  EntrepreneurPulse.com.au  Fact Sheet for Healthcare Providers:  IncredibleEmployment.be  This test is no t yet approved or cleared by the [place] FDA and  has been authorized for detection and/or diagnosis of SARS-CoV-2 by FDA under an Emergency Use Authorization (EUA). This EUA will remain  in effect (meaning this test can be used) for the duration of the COVID-19 declaration under Section 564(b)(1) of the Act, 21 U.S.C.section 360bbb-3(b)(1), unless the authorization is terminated  or revoked sooner.       Influenza A by PCR NEGATIVE NEGATIVE   Influenza B by PCR NEGATIVE NEGATIVE    Comment: (NOTE) The Xpert Xpress SARS-CoV-2/FLU/RSV plus assay is intended as an aid in the diagnosis of influenza from Nasopharyngeal swab specimens and should not be used as a sole basis for treatment. Nasal washings and aspirates are unacceptable for Xpert Xpress SARS-CoV-2/FLU/RSV testing.  Fact Sheet for Patients: EntrepreneurPulse.com.au  Fact Sheet for Healthcare Providers: IncredibleEmployment.be  This test is not yet approved or cleared by the [place] and has been authorized  for  detection and/or diagnosis of SARS-CoV-2 by FDA under an Emergency Use Authorization (EUA). This EUA will remain in effect (meaning this test can be used) for the duration of the COVID-19 declaration under Section 564(b)(1) of the Act, 21 U.S.C. section 360bbb-3(b)(1), unless the authorization is terminated or revoked.  Performed at [facility], [address] [place], [place] [address]     CT ABDOMEN PELVIS WO CONTRAST  Result Date: 08/01/2020 CLINICAL DATA:  Abdominal pain, constipation EXAM: CT ABDOMEN AND PELVIS WITHOUT CONTRAST TECHNIQUE: Multidetector CT imaging of the abdomen and pelvis was performed following the standard protocol without IV contrast. COMPARISON:  CTA abdomen/pelvis dated 04/16/2019 FINDINGS: Lower chest: Small right pleural effusion. Hepatobiliary: Unenhanced liver is grossly unremarkable. Layering gallstone (series 2/image 29), without associated inflammatory changes. No intrahepatic or extrahepatic ductal dilatation. Pancreas: Within normal limits. Spleen: Within normal limits. Adrenals/Urinary Tract: Adrenal glands are within normal limits. Kidneys are within normal limits. No renal, ureteral, or bladder calculi. No hydronephrosis. Bladder is within normal limits. Stomach/Bowel: Stomach is notable for a small hiatal hernia. Multiple dilated loops of small bowel in the left mid abdomen. Given the additional findings, this favors reactive small bowel ileus over small bowel obstruction. Abnormal blind-ending tubular structure in the right lower quadrant (series 2/image 83) is poorly visualized/partially obscured by fluid but favors an abnormal appendix, suggesting acute appendicitis. This is distinct from the terminal ileum (series 2/image 56). Sigmoid diverticulosis, without evidence of diverticulitis. Vascular/Lymphatic: No evidence of abdominal aortic aneurysm. Atherosclerotic calcifications of the abdominal aorta and branch vessels. No suspicious abdominopelvic  lymphadenopathy. Reproductive: Uterine fibroids. No adnexal masses. Other: Small to moderate abdominopelvic ascites, including fluid in the right lower quadrant which partially obscures the suspected appendix. No free air to suggest macroscopic perforation. Musculoskeletal: Degenerative changes of the visualized thoracolumbar spine. IMPRESSION: Although poorly visualized, there is a suspected abnormal appendix in the right lower quadrant, suggesting acute appendicitis. No free air to suggest macroscopic perforation. Multiple dilated loops of small bowel in the left mid abdomen. In this clinical setting, this appearance favors reactive small bowel ileus over small bowel obstruction. Sigmoid diverticulosis, without evidence of diverticulitis. Small to moderate abdominopelvic ascites, including fluid in the right lower quadrant. Electronically Signed   By: Julian Hy M.D.   On: 08/01/2020 19:06    ROS:  Pertinent items are noted in HPI.  Blood pressure (!) 160/41, pulse (!) 58, temperature 98.9 F (37.2 C), temperature source Oral, resp. rate 17, height 5\' 4"  (1.626 m), weight 56.2 kg, SpO2 95 %. Physical Exam: Pleasant white female in no acute distress Head is normocephalic, atraumatic Lungs clear to auscultation with good breath sounds bilaterally Heart examination at this time reveals normal sinus rhythm by telemetry. Abdomen is distended but soft.  She has migratory discomfort to palpation throughout her abdomen.  There is no specific right lower quadrant abdominal pain.  No hernias are present.  No rigidity is noted.  Minimal bowel sounds appreciated.  CT scan images personally reviewed  Assessment/Plan: Impression: Abdominal pain and distention of unknown etiology.  She does have an ileus and intra-abdominal fluid which could be explaining some of the mild thickening of the appendix.  Her white blood cell count is normal and her story is not as suggestive for acute appendicitis.  She may  have a nonspecific gastroenteritis.  She does have worsening acute on chronic kidney injury.  She is also taking Eliquis so I would not be able to operate on her for 48  hours.  I do not think she needs acute surgical invention at this time.  I would start her on either Zosyn or an antibiotic combination appropriate for her kidney disease.  She is at increased risk for surgical intervention given her cardiac history and variable ejection fractions.  Her last ejection fraction done in May of this year looks good.  I would try an NG tube, though she may not tolerate this well.  We will follow with you.  This plan was explained to the patient and family member, who understand and agree.  Aviva Signs 08/01/2020, 9:14 PM

## 2020-08-01 NOTE — H&P (Signed)
History and Physical    TALLEY CASCO NOB:[id number] DOB: [birth year] DOA: 08/01/2020  PCP: Glenda Chroman, MD  Patient coming from: Home.  I have personally briefly reviewed patient's old medical records in [place]  Chief Complaint: Abdominal pain and nausea.   HPI: Crystal [name] is a 86 y.o. female with medical history significant of iron deficiency anemia, Bell's palsy, history of breast cancer and right mastectomy, CAD, history of STEMI, chronic systolic heart failure, paroxysmal atrial fibrillation, stage IV chronic kidney disease, essential hypertension, GERD, gout, history of squamous cell carcinoma of the skin, mixed hyperlipidemia, osteopenia, thyroid nodule, type 2 diabetes mellitus who is coming to the emergency department due to abdominal pain and nausea with frequent dry heaving for the past 3 days.  She had a brief episode of emesis on Monday after trying some ginger ale.  She has not vomited since.  She has been constipated.  She denied history of recent diarrhea, melena or hematochezia.  No dysuria, frequency or hematuria.  No chest pain, dyspnea, palpitations, diaphoresis, PND or orthopnea.  She gets frequent lower extremity edema.  Polyuria, polydipsia, polyphagia or blurred vision.  ED Course: Initial vital signs were temperature 98.9 F, pulse 58, respiration 18, BP 160/49 mmHg O2 sat 97% on room air.  The patient received a 500 mL NS bolus in the emergency department.  Lab work: Her CBC shows a white count of 8.5 with 76% neutrophils, hemoglobin 11.1 g/dL and platelets 170.  Lipase was 21 units/L.  Sodium 127 and chloride 94 mmol/L.  All other electrolytes were normal.  Glucose 106, BUN 69 and creatinine 3.57 mg/dL (6 weeks ago was 2.42 mg/dL).  Her AST was 50 units/L, blood the rest of the hepatic functions were unremarkable.  Imaging: CT abdomen pelvis without contrast showed a poorly visualized suspected abnormal appendix with questionable acute appendicitis.   There was no free air.  There were multiple dilated loops of small bowel seen in the left midabdomen consistent with ileus.  There was sigmoid diverticulosis without diverticulitis.  There was small to moderate abdominal pelvic ascites, including fluid in the RLQ.  Please see images and full radiology report for further detail..  Review of Systems: As per HPI otherwise all other systems reviewed and are negative.  Past Medical History:  Diagnosis Date   Anemia    Bell's palsy    Breast cancer ([place]) 1998   Right mastectomy   CAD (coronary artery disease)    a. s/p STEMI in 01/2019 with DES to mid-LAD   CHF (congestive heart failure) ([place])    a. EF 30-35% by echo in 01/2019   Chronic kidney disease    Essential hypertension    GERD (gastroesophageal reflux disease)    Gout    History of skin cancer    Squamous cell, left shoulder   Mixed hyperlipidemia    Osteopenia    Paroxysmal atrial fibrillation (HCC)    Thyroid nodule    Type 2 diabetes mellitus ([place])     Past Surgical History:  Procedure Laterality Date   BIOPSY  10/13/2019   Procedure: BIOPSY;  Surgeon: Daneil Dolin, MD;  [facility];  Service: Endoscopy;;   CATARACT EXTRACTION  2016   COLONOSCOPY  2019   Dr Anthony Sar   CORONARY ANGIOGRAPHY N/A 01/16/2020   Procedure: CORONARY ANGIOGRAPHY;  Surgeon: Jettie Booze, MD;  Location: [facility];  Service: Cardiovascular;  Laterality: N/A;   CORONARY STENT INTERVENTION N/A  01/16/2020   Procedure: CORONARY STENT INTERVENTION;  Surgeon: Jettie Booze, MD;  Location: [facility];  Service: Cardiovascular;  Laterality: N/A;   CORONARY/GRAFT ACUTE MI REVASCULARIZATION N/A 01/30/2019   Procedure: Coronary/Graft Acute MI Revascularization;  Surgeon: Burnell Blanks, MD;  Location: [facility];  Service: Cardiovascular;  Laterality: N/A;   ESOPHAGOGASTRODUODENOSCOPY (EGD) WITH PROPOFOL N/A 10/13/2019   Non-obstructing Schatzki ring  at GE junction, s/p dilation, erosive gastropathy with stigmata of recent bleeding, normal duodenum. Negative H.pylori.    HYSTEROSCOPY     INTRAVASCULAR ULTRASOUND/IVUS N/A 01/16/2020   Procedure: Intravascular Ultrasound/IVUS;  Surgeon: Jettie Booze, MD;  Location: [facility];  Service: Cardiovascular;  Laterality: N/A;   LEFT HEART CATH AND CORONARY ANGIOGRAPHY N/A 01/30/2019   Procedure: LEFT HEART CATH AND CORONARY ANGIOGRAPHY;  Surgeon: Burnell Blanks, MD;  Location: [facility];  Service: Cardiovascular;  Laterality: N/A;   LEFT HEART CATH AND CORONARY ANGIOGRAPHY N/A 01/16/2020   Procedure: LEFT HEART CATH AND CORONARY ANGIOGRAPHY;  Surgeon: Jettie Booze, MD;  Location: [facility];  Service: Cardiovascular;  Laterality: N/A;   MALONEY DILATION N/A 10/13/2019   Procedure: Venia Minks DILATION;  Surgeon: Daneil Dolin, MD;  [facility];  Service: Endoscopy;  Laterality: N/A;   Right mastectomy  1998   [place]   RIGHT/LEFT HEART CATH AND CORONARY ANGIOGRAPHY N/A 02/13/2020   Procedure: RIGHT/LEFT HEART CATH AND CORONARY ANGIOGRAPHY;  Surgeon: Martinique, Peter M, MD;  Location: [facility];  Service: Cardiovascular;  Laterality: N/A;   Social History  reports that she has never smoked. She has never used smokeless tobacco. She reports that she does not drink alcohol and does not use drugs.  Allergies  Allergen Reactions   Bactrim [Sulfamethoxazole-Trimethoprim] Nausea And Vomiting   Sulfa Antibiotics Nausea And Vomiting   Amlodipine Other (See Comments)    EDEMA   Clonidine Derivatives Other (See Comments)    FATIGUE   Evista [Raloxifene Hcl] Other (See Comments)    GERD   Fosamax [Alendronate Sodium] Other (See Comments)    INCREASED GERD    Glipizide Other (See Comments)    PALPITATIONS   Lipitor [Atorvastatin Calcium] Other (See Comments)    ACHING   Losartan Other (See Comments)    FATIGUE    Pravastatin Other (See  Comments)    ACHING   Azithromycin Rash and Other (See Comments)    FACIAL BURNING    Family History  Problem Relation Age of Onset   Stroke Mother    Heart attack Father    Aneurysm Sister    Heart attack Maternal Uncle    Heart disease Maternal Uncle    Heart attack Paternal Aunt    Heart disease Paternal Aunt    Heart attack Paternal Grandfather    Heart disease Paternal Grandfather    Heart attack Paternal Aunt    Heart disease Paternal Aunt    Heart attack Maternal Uncle    Heart disease Maternal Uncle    Heart attack Maternal Uncle    Heart disease Maternal Uncle    Heart attack Sister    Heart disease Sister    Cancer Sister    Colon cancer Neg Hx    Colon polyps Neg Hx    Prior to Admission medications   Medication Sig Start Date End Date Taking? Authorizing Provider  acetaminophen (TYLENOL) 500 MG tablet Take 500 mg by mouth every 6 (six) hours as needed  for headache (pain).   Yes [provider]  amiodarone (PACERONE) 200 MG tablet Take 200 mg by mouth daily.   Yes [provider]  amLODipine (NORVASC) 2.5 MG tablet Take 1 tablet (2.5 mg total) by mouth daily. 06/07/20 09/05/20 Yes Satira Sark, MD  carvedilol (COREG) 25 MG tablet Take 1 tablet (25 mg total) by mouth in the morning and at bedtime. Breakfast & supper 11/28/19  Yes Satira Sark, MD  cholecalciferol (VITAMIN D3) 25 MCG (1000 UT) tablet Take 1,000 Units by mouth daily with breakfast.   Yes [provider]  diazepam (VALIUM) 2 MG tablet Take 2 mg by mouth 2 (two) times daily as needed (dizzy spells.).   Yes [provider]  ELIQUIS 2.5 MG TABS tablet TAKE (1) TABLET TWICE DAILY. Patient taking differently: Take 2.5 mg by mouth. 06/08/20  Yes Satira Sark, MD  epoetin alfa (EPOGEN) 3000 UNIT/ML injection Inject into the skin. 04/20/20  Yes [provider]  ferrous sulfate 325 (65 FE) MG tablet Take 325 mg by mouth daily before lunch.   Yes [provider]  hydrALAZINE (APRESOLINE) 100 MG tablet TAKE 1 TABLET EVERY 8 HOURS Patient taking differently: Take 100 mg by mouth 3 (three) times daily. 04/26/20  Yes Verta Ellen., NP  isosorbide mononitrate (IMDUR) 120 MG 24 hr tablet TAKE 1 TABLET BY MOUTH AT BEDTIME. 06/22/20  Yes Satira Sark, MD  Multiple Vitamin (MULTIVITAMIN WITH MINERALS) TABS tablet Take 1 tablet by mouth daily. Centrum Silver for Women   Yes [provider]  multivitamin-lutein (OCUVITE-LUTEIN) CAPS capsule Take 1 capsule by mouth daily before lunch.    Yes [provider]  nitroGLYCERIN (NITROSTAT) 0.4 MG SL tablet Place 1 tablet (0.4 mg total) under the tongue every 5 (five) minutes x 3 doses as needed for chest pain. 02/02/19  Yes Reino Bellis B, NP  pantoprazole (PROTONIX) 40 MG tablet Take 1 tablet (40 mg total) by mouth daily. Take 30 minutes before breakfast 01/12/20  Yes Annitta Needs, NP  potassium chloride SA (KLOR-CON) 20 MEQ tablet Take 3 tablets (60 mEq total) by mouth daily. 06/04/20  Yes Satira Sark, MD  rosuvastatin (CRESTOR) 5 MG tablet Take 0.5 tablets (2.5 mg total) by mouth every other day. 03/05/20  Yes Reino Bellis B, NP  torsemide (DEMADEX) 20 MG tablet Take 2 tablets (40 mg total) by mouth daily. 05/25/20 08/23/20 Yes Satira Sark, MD  amLODipine (NORVASC) 5 MG tablet Take by mouth. Patient not taking: Reported on 08/01/2020    [provider]  Calcium Carbonate-Vitamin D (CALCIUM 500 + D PO) Take 1 tablet by mouth daily before lunch. Citracal plus D3 Patient not taking: Reported on 08/01/2020    [provider]  CINNAMON PO Take 1,000 mg by mouth daily before lunch.  Patient not taking: Reported on 08/01/2020    [provider]  loperamide (IMODIUM) 2 MG capsule Take 2-4 mg by mouth 4 (four) times daily as needed for diarrhea or loose stools. Patient not taking: No sig reported    [provider]  meclizine (ANTIVERT) 25 MG  tablet Take 25 mg by mouth 2 (two) times daily as needed for dizziness. Patient not taking: Reported on 08/01/2020    [provider]  Omega-3 Fatty Acids (FISH OIL PO) Take 1,576 mg by mouth daily before lunch. Patient not taking: No sig reported    [provider]  saccharomyces boulardii (FLORASTOR) 250 MG  capsule Take 250 mg by mouth 2 (two) times daily as needed (diarrhea). Patient not taking: No sig reported    [provider]  sodium chloride (OCEAN) 0.65 % SOLN nasal spray Place 1 spray into both nostrils 2 (two) times daily as needed for congestion.  Patient not taking: Reported on 08/01/2020    [provider]   Physical Exam: Vitals:   08/01/20 1845 08/01/20 1848 08/01/20 2030 08/01/20 2130  BP:  (!) 176/45 (!) 160/41 (!) 172/58  Pulse: (!) 57 (!) 59 (!) 58 (!) 59  Resp: (!) 25 (!) 24 17 19   Temp:      TempSrc:      SpO2: 95% 97% 95% 96%  Weight:      Height:       Constitutional: NAD, calm, comfortable Eyes: PERRL, lids and conjunctivae normal ENMT: Mucous membranes are mildly dry.  Posterior pharynx clear of any exudate or lesions. Neck: normal, supple, no masses, no thyromegaly Respiratory: Clear to auscultation bilaterally, no wheezing, no crackles. Normal respiratory effort. No accessory muscle use.  Cardiovascular: Sinus bradycardia in the high 50s, no murmurs / rubs / gallops.  Trace lower extremities pitting edema. 2+ pedal pulses. No carotid bruits.  Abdomen: Distended.  Bowel sounds positive.  Soft, positive LLQ tenderness without guarding or rebound, no masses palpated. No hepatosplenomegaly. Musculoskeletal: no clubbing / cyanosis. Good ROM, no contractures. Normal muscle tone.  Skin: no rashes, lesions, ulcers on very limited dermatological examination. Neurologic: CN 2-12 grossly intact. Sensation intact, DTR normal. Strength 5/5 in all 4.  Psychiatric: Normal judgment and insight. Alert and oriented x 3. Normal mood.   Labs on  Admission: I have personally reviewed following labs and imaging studies  CBC: Recent Labs  Lab 08/01/20 1508  WBC 8.5  NEUTROABS 7.7  HGB 11.1*  HCT 33.6*  MCV 85.7  PLT 378   Basic Metabolic Panel: Recent Labs  Lab 08/01/20 1508  NA 127*  K 4.6  CL 94*  CO2 21*  GLUCOSE 106*  BUN 69*  CREATININE 3.57*  CALCIUM 9.0   GFR: Estimated Creatinine Clearance: 10.3 mL/min (A) (by C-G formula based on SCr of 3.57 mg/dL (H)).  Liver Function Tests: Recent Labs  Lab 08/01/20 1508  AST 50*  ALT 43  ALKPHOS 66  BILITOT 1.2  PROT 6.7  ALBUMIN 3.6   Radiological Exams on Admission: CT ABDOMEN PELVIS WO CONTRAST  Result Date: 08/01/2020 CLINICAL DATA:  Abdominal pain, constipation EXAM: CT ABDOMEN AND PELVIS WITHOUT CONTRAST TECHNIQUE: Multidetector CT imaging of the abdomen and pelvis was performed following the standard protocol without IV contrast. COMPARISON:  CTA abdomen/pelvis dated 04/16/2019 FINDINGS: Lower chest: Small right pleural effusion. Hepatobiliary: Unenhanced liver is grossly unremarkable. Layering gallstone (series 2/image 29), without associated inflammatory changes. No intrahepatic or extrahepatic ductal dilatation. Pancreas: Within normal limits. Spleen: Within normal limits. Adrenals/Urinary Tract: Adrenal glands are within normal limits. Kidneys are within normal limits. No renal, ureteral, or bladder calculi. No hydronephrosis. Bladder is within normal limits. Stomach/Bowel: Stomach is notable for a small hiatal hernia. Multiple dilated loops of small bowel in the left mid abdomen. Given the additional findings, this favors reactive small bowel ileus over small bowel obstruction. Abnormal blind-ending tubular structure in the right lower quadrant (series 2/image 83) is poorly visualized/partially obscured by fluid but favors an abnormal appendix, suggesting acute appendicitis. This is distinct from the terminal ileum (series 2/image 56). Sigmoid diverticulosis,  without evidence of diverticulitis. Vascular/Lymphatic: No evidence of abdominal aortic  aneurysm. Atherosclerotic calcifications of the abdominal aorta and branch vessels. No suspicious abdominopelvic lymphadenopathy. Reproductive: Uterine fibroids. No adnexal masses. Other: Small to moderate abdominopelvic ascites, including fluid in the right lower quadrant which partially obscures the suspected appendix. No free air to suggest macroscopic perforation. Musculoskeletal: Degenerative changes of the visualized thoracolumbar spine. IMPRESSION: Although poorly visualized, there is a suspected abnormal appendix in the right lower quadrant, suggesting acute appendicitis. No free air to suggest macroscopic perforation. Multiple dilated loops of small bowel in the left mid abdomen. In this clinical setting, this appearance favors reactive small bowel ileus over small bowel obstruction. Sigmoid diverticulosis, without evidence of diverticulitis. Small to moderate abdominopelvic ascites, including fluid in the right lower quadrant. Electronically Signed   By: Julian Hy M.D.   On: 08/01/2020 19:06    06/05/2020 echocardiogram complete IMPRESSIONS:   1. Left ventricular ejection fraction, by estimation, is 65 to 70%. The  left ventricle has normal function. The left ventricle has no regional  wall motion abnormalities. There is mild left ventricular hypertrophy.  Left ventricular diastolic parameters  are consistent with Grade II diastolic dysfunction (pseudonormalization).   2. Right ventricular systolic function is normal. The right ventricular  size is normal. There is mildly elevated pulmonary artery systolic  pressure. The estimated right ventricular systolic pressure is 60.4 mmHg.   3. Left atrial size was mildly dilated.   4. Right atrial size was moderately dilated.   5. There is a trivial pericardial effusion posterior to the left  ventricle.   6. The mitral valve is abnormal. Mild to moderate  mitral valve  regurgitation.   7. Tricuspid valve regurgitation is moderate.   8. The aortic valve is tricuspid. Aortic valve regurgitation is trivial.  Aortic regurgitation PHT measures 956 msec.   9. The inferior vena cava is normal in size with <50% respiratory  variability, suggesting right atrial pressure of 8 mmHg.   Comparison(s): Echocardiogram done 02/17/20 showed an EF of 40-45%.   EKG: Independently reviewed.   Assessment/Plan Principal Problem:   Ileus ([place]) Observation/telemetry. Keep NPO. Continue IV fluids. Antiemetics as needed. Low-dose analgesics as needed. Continue Flagyl 500 mg IV every 8 hours. Continue ciprofloxacin 400 mg IV every 12 hours. Dr.Jenkins consult greatly appreciated.  Active Problems:   Acute kidney injury superimposed on chronic kidney disease ([place]) Secondary to recently decreased oral hydration. Continue IV fluids. Avoid hypotension. Avoid nephrotoxic medications. Monitor intake and output. Follow-up renal function electrolytes.    Hyponatremia Due to poor oral intake. Continue normal saline infusion. Follow-up sodium level.    Type 2 diabetes mellitus (HCC) Currently NPO. Check hemoglobin A1c CBG monitoring every 6 hours.    Hyperlipidemia On Crestor. Currently NPO.    CAD (coronary artery disease) Currently NPO. Holding statin, beta-blocker and anticoagulant.    Hyperlipidemia LDL goal <70 Has sensitivity to statins. On rosuvastatin 2.5 mg every other day.    AF (paroxysmal atrial fibrillation) (HCC) CHA?DS?-VASc Score of at least 6. On carvedilol 25 mg p.o. twice daily. She is currently bradycardic. Apixaban held per Dr. Arnoldo Morale request. Holding beta-blocker at this time.    Iron deficiency anemia Monitor hematocrit and hemoglobin. Transfuse as needed.    DVT prophylaxis: On Eliquis (held).  SCDs to begin tomorrow. Code Status:   Full code. Family Communication:   Disposition Plan:   Patient is  from:  Home.  Anticipated DC to:  Home.  Anticipated DC date:  08/03/2020.  Anticipated DC barriers: Clinical status. Consults called:  Dr.  Aviva Signs (general surgery). Admission status:  Observation/telemetry.   Severity of Illness: High severity after presenting with nausea and abdominal pain in the setting of ileus and AKI on stage IV CKD with hyponatremia.  The patient will need to remain in the hospital for close monitoring, IV hydration and electrolyte replacement.  Reubin Milan MD [facility]  How to contact the [facility] Attending or Consulting provider [place] or covering provider during after hours [place], for this patient?   Check the care team in [facility] and look for a) attending/consulting [facility] provider listed and b) the [facility] team listed Log into [email or web address] and use [place]'s universal password to access. If you do not have the password, please contact the hospital operator. Locate the [facility] provider you are looking for under [facility] and page to a number that you can be directly reached. If you still have difficulty reaching the provider, please page the [facility] (Director on Call) for the Hospitalists listed on amion for assistance.  08/01/2020, 10:26 PM   This document was prepared using Dragon voice recognition software and may contain some unintended transcription errors

## 2020-08-01 NOTE — ED Notes (Signed)
Bladder scan 136 ml

## 2020-08-01 NOTE — ED Provider Notes (Addendum)
[facility] EMERGENCY DEPARTMENT Provider Note   CSN: [id number] Arrival date & time: 08/01/20  1422     History Chief Complaint  Patient presents with   Abdominal Pain    Crystal [name] is a 86 y.o. female with a hx of CAD, CHF, CKD, hypertension, hyperlipidemia, paroxysmal afib, DM, renal artery stenosis, and constipation who presents to the ED with complaints of abdominal pain for the past 3 days.  Patient states the pain is to her generalized abdomen however is more prominent in the lower belly.  Pain is constant, no alleviating or aggravating factors.  This is associated with  nausea, dry heaving, constipation, and decreased urination.  Saw her PCP who prescribed Zofran which has been taking without much relief, states she went for an xray @ [facility] yesterday .  Last BM was 3 days prior, small and hard, she has been able to pass gas.  She denies fever, vomiting, melena, hematochezia, chest pain, or dyspnea.  HPI     Past Medical History:  Diagnosis Date   Anemia    Bell's palsy    Breast cancer ([place]) 1998   Right mastectomy   CAD (coronary artery disease)    a. s/p STEMI in 01/2019 with DES to mid-LAD   CHF (congestive heart failure) ([place])    a. EF 30-35% by echo in 01/2019   Chronic kidney disease    Essential hypertension    GERD (gastroesophageal reflux disease)    Gout    History of skin cancer    Squamous cell, left shoulder   Mixed hyperlipidemia    Osteopenia    Paroxysmal atrial fibrillation (HCC)    Thyroid nodule    Type 2 diabetes mellitus ([place])     Patient Active Problem List   Diagnosis Date Noted   Melena 07/12/2020   Iron deficiency anemia 07/12/2020   Constipation 07/12/2020   Pleural effusion    Acute kidney injury superimposed on chronic kidney disease (HCC)    Shortness of breath    Flash pulmonary edema (HCC)    Hypertensive urgency    AF (paroxysmal atrial fibrillation) (HCC)    Statin myopathy 02/02/2020   Nonspecific abnormal  electrocardiogram (ECG) (EKG)    Dysphagia 09/21/2019   Anemia 09/21/2019   Diarrhea 07/06/2019   Acute on chronic combined systolic and diastolic CHF (congestive heart failure) ([place]) 02/28/2019   Acute respiratory failure with hypoxemia ([place]) 02/28/2019   Acute pulmonary edema (HCC)    Hyperlipidemia LDL goal <70    Acute renal failure superimposed on chronic kidney disease ([place])    CHF exacerbation ([place]) 02/27/2019   CAD (coronary artery disease) 02/27/2019   STEMI (ST elevation myocardial infarction) ([place]) 01/30/2019   NSTEMI (non-ST elevated myocardial infarction) ([place]) 01/30/2019   Non-ST elevation (NSTEMI) myocardial infarction [facility])    Renal artery stenosis ([place]) 12/02/2017   Diabetes mellitus ([place]) 03/04/2011   Hyperlipidemia 03/04/2011   Resistant hypertension 03/04/2011    Past Surgical History:  Procedure Laterality Date   BIOPSY  10/13/2019   Procedure: BIOPSY;  Surgeon: Daneil Dolin, MD;  Location: [facility];  Service: Endoscopy;;   CATARACT EXTRACTION  2016   COLONOSCOPY  2019   Dr Anthony Sar   CORONARY ANGIOGRAPHY N/A 01/16/2020   Procedure: CORONARY ANGIOGRAPHY;  Surgeon: Jettie Booze, MD;  Location: [place] CV LAB;  Service: Cardiovascular;  Laterality: N/A;   CORONARY STENT INTERVENTION N/A 01/16/2020   Procedure: CORONARY STENT INTERVENTION;  Surgeon: Jettie Booze, MD;  Location: [place] CV LAB;  Service: Cardiovascular;  Laterality: N/A;   CORONARY/GRAFT ACUTE MI REVASCULARIZATION N/A 01/30/2019   Procedure: Coronary/Graft Acute MI Revascularization;  Surgeon: Burnell Blanks, MD;  Location: [place] CV LAB;  Service: Cardiovascular;  Laterality: N/A;   ESOPHAGOGASTRODUODENOSCOPY (EGD) WITH PROPOFOL N/A 10/13/2019   Non-obstructing Schatzki ring at GE junction, s/p dilation, erosive gastropathy with stigmata of recent bleeding, normal duodenum. Negative H.pylori.    HYSTEROSCOPY     INTRAVASCULAR ULTRASOUND/IVUS N/A 01/16/2020    Procedure: Intravascular Ultrasound/IVUS;  Surgeon: Jettie Booze, MD;  Location: [place] CV LAB;  Service: Cardiovascular;  Laterality: N/A;   LEFT HEART CATH AND CORONARY ANGIOGRAPHY N/A 01/30/2019   Procedure: LEFT HEART CATH AND CORONARY ANGIOGRAPHY;  Surgeon: Burnell Blanks, MD;  Location: [place] CV LAB;  Service: Cardiovascular;  Laterality: N/A;   LEFT HEART CATH AND CORONARY ANGIOGRAPHY N/A 01/16/2020   Procedure: LEFT HEART CATH AND CORONARY ANGIOGRAPHY;  Surgeon: Jettie Booze, MD;  Location: [place] CV LAB;  Service: Cardiovascular;  Laterality: N/A;   MALONEY DILATION N/A 10/13/2019   Procedure: Venia Minks DILATION;  Surgeon: Daneil Dolin, MD;  Location[facility];  Service: Endoscopy;  Laterality: N/A;   Right mastectomy  1998   [place]   RIGHT/LEFT HEART CATH AND CORONARY ANGIOGRAPHY N/A 02/13/2020   Procedure: RIGHT/LEFT HEART CATH AND CORONARY ANGIOGRAPHY;  Surgeon: Martinique, Peter M, MD;  Location: [place] CV LAB;  Service: Cardiovascular;  Laterality: N/A;     OB History   No obstetric history on file.     Family History  Problem Relation Age of Onset   Stroke Mother    Heart attack Father    Aneurysm Sister    Heart attack Maternal Uncle    Heart disease Maternal Uncle    Heart attack Paternal Aunt    Heart disease Paternal Aunt    Heart attack Paternal Grandfather    Heart disease Paternal Grandfather    Heart attack Paternal Aunt    Heart disease Paternal Aunt    Heart attack Maternal Uncle    Heart disease Maternal Uncle    Heart attack Maternal Uncle    Heart disease Maternal Uncle    Heart attack Sister    Heart disease Sister    Cancer Sister    Colon cancer Neg Hx    Colon polyps Neg Hx     Social History   Tobacco Use   Smoking status: Never   Smokeless tobacco: Never  Vaping Use   Vaping Use: Never used  Substance Use Topics   Alcohol use: Never   Drug use: Never    Home Medications Prior to  Admission medications   Medication Sig Start Date End Date Taking? Authorizing Provider  acetaminophen (TYLENOL) 500 MG tablet Take 500 mg by mouth every 6 (six) hours as needed for headache (pain).    [provider]  amiodarone (PACERONE) 200 MG tablet Take 200 mg by mouth daily.    [provider]  amLODipine (NORVASC) 2.5 MG tablet Take 1 tablet (2.5 mg total) by mouth daily. 06/07/20 09/05/20  Satira Sark, MD  Calcium Carbonate-Vitamin D (CALCIUM 500 + D PO) Take 1 tablet by mouth daily before lunch. Citracal plus D3    [provider]  carvedilol (COREG) 25 MG tablet Take 1 tablet (25 mg total) by mouth in the morning and at bedtime. Breakfast & supper 11/28/19   Satira Sark, MD  cholecalciferol (  VITAMIN D3) 25 MCG (1000 UT) tablet Take 1,000 Units by mouth daily with breakfast.    [provider]  CINNAMON PO Take 1,000 mg by mouth daily before lunch.     [provider]  diazepam (VALIUM) 2 MG tablet Take 2 mg by mouth 2 (two) times daily as needed (dizzy spells.).    [provider]  ELIQUIS 2.5 MG TABS tablet TAKE (1) TABLET TWICE DAILY. 06/08/20   Satira Sark, MD  epoetin alfa (EPOGEN) 3000 UNIT/ML injection Inject into the skin. 04/20/20   [provider]  ferrous sulfate 325 (65 FE) MG tablet Take 325 mg by mouth daily before lunch.    [provider]  hydrALAZINE (APRESOLINE) 100 MG tablet TAKE 1 TABLET EVERY 8 HOURS 04/26/20   Verta Ellen., NP  isosorbide mononitrate (IMDUR) 120 MG 24 hr tablet TAKE 1 TABLET BY MOUTH AT BEDTIME. 06/22/20   Satira Sark, MD  loperamide (IMODIUM) 2 MG capsule Take 2-4 mg by mouth 4 (four) times daily as needed for diarrhea or loose stools.    [provider]  meclizine (ANTIVERT) 25 MG tablet Take 25 mg by mouth 2 (two) times daily as needed for dizziness.    [provider]  Multiple Vitamin (MULTIVITAMIN WITH MINERALS) TABS tablet Take  1 tablet by mouth daily. Centrum Silver for Women    [provider]  multivitamin-lutein (OCUVITE-LUTEIN) CAPS capsule Take 1 capsule by mouth daily before lunch.     [provider]  nitroGLYCERIN (NITROSTAT) 0.4 MG SL tablet Place 1 tablet (0.4 mg total) under the tongue every 5 (five) minutes x 3 doses as needed for chest pain. 02/02/19   Cheryln Manly, NP  Omega-3 Fatty Acids (FISH OIL PO) Take 1,576 mg by mouth daily before lunch.    [provider]  pantoprazole (PROTONIX) 40 MG tablet Take 1 tablet (40 mg total) by mouth daily. Take 30 minutes before breakfast 01/12/20   Annitta Needs, NP  potassium chloride SA (KLOR-CON) 20 MEQ tablet Take 3 tablets (60 mEq total) by mouth daily. 06/04/20   Satira Sark, MD  rosuvastatin (CRESTOR) 5 MG tablet Take 0.5 tablets (2.5 mg total) by mouth every other day. 03/05/20   Cheryln Manly, NP  saccharomyces boulardii (FLORASTOR) 250 MG capsule Take 250 mg by mouth 2 (two) times daily as needed (diarrhea).    [provider]  sodium chloride (OCEAN) 0.65 % SOLN nasal spray Place 1 spray into both nostrils 2 (two) times daily as needed for congestion.     [provider]  torsemide (DEMADEX) 20 MG tablet Take 2 tablets (40 mg total) by mouth daily. 05/25/20 08/23/20  Satira Sark, MD    Allergies    Bactrim [sulfamethoxazole-trimethoprim], Sulfa antibiotics, Amlodipine, Clonidine derivatives, Evista [raloxifene hcl], Fosamax [alendronate sodium], Glipizide, Lipitor [atorvastatin calcium], Losartan, Pravastatin, and Azithromycin  Review of Systems   Review of Systems  Constitutional:  Negative for chills and fever.  Respiratory:  Negative for shortness of breath.   Cardiovascular:  Negative for chest pain.  Gastrointestinal:  Positive for abdominal pain, constipation and nausea (with dry heaving). Negative for anal bleeding, blood in stool and vomiting.  Genitourinary:  Positive for decreased  urine volume and difficulty urinating.  Neurological:  Negative for syncope.  All other systems reviewed and are negative.  Physical Exam Updated Vital Signs BP (!) 160/49 (BP Location: Right Arm)   Pulse (!) 58   Temp  98.9 F (37.2 C) (Oral)   Resp 18   Ht 5\' 4"  (1.626 m)   Wt 56.2 kg   SpO2 97%   BMI 21.28 kg/m   Physical Exam Vitals and nursing note reviewed.  Constitutional:      General: She is in acute distress (Patient appears uncomfortable.).     Appearance: She is well-developed. She is not toxic-appearing.  HENT:     Head: Normocephalic and atraumatic.  Eyes:     General:        Right eye: No discharge.        Left eye: No discharge.     Conjunctiva/sclera: Conjunctivae normal.  Cardiovascular:     Rate and Rhythm: Normal rate and regular rhythm.  Pulmonary:     Effort: Pulmonary effort is normal. No respiratory distress.     Breath sounds: Normal breath sounds. No wheezing, rhonchi or rales.  Abdominal:     General: There is distension.     Tenderness: There is abdominal tenderness (Generalized, more prominent in the lower abdomen.).     Comments: Tympanic to percussion.  Musculoskeletal:     Cervical back: Neck supple.  Skin:    General: Skin is warm and dry.     Findings: No rash.  Neurological:     Mental Status: She is alert.     Comments: Clear speech.   Psychiatric:        Behavior: Behavior normal.    ED Results / Procedures / Treatments   Labs (all labs ordered are listed, but only abnormal results are displayed) Labs Reviewed  CBC WITH DIFFERENTIAL/PLATELET - Abnormal; Notable for the following components:      Result Value   Hemoglobin 11.1 (*)    HCT 33.6 (*)    RDW 17.2 (*)    Lymphs Abs 0.3 (*)    All other components within normal limits  COMPREHENSIVE METABOLIC PANEL - Abnormal; Notable for the following components:   Sodium 127 (*)    Chloride 94 (*)    CO2 21 (*)    Glucose, Bld 106 (*)    BUN 69 (*)    Creatinine, Ser  3.57 (*)    AST 50 (*)    GFR, Estimated 12 (*)    All other components within normal limits  LIPASE, BLOOD  URINALYSIS, ROUTINE W REFLEX MICROSCOPIC    EKG None  Radiology No results found.  Procedures Procedures   Medications Ordered in ED Medications - No data to display  ED Course  I have reviewed the triage vital signs and the nursing notes.  Pertinent labs & imaging results that were available during my care of the patient were reviewed by me and considered in my medical decision making (see chart for details).    MDM Rules/Calculators/A&P                          Patient presents to the ED with complaints of abdominal pain.  She appears uncomfortable, her blood pressure is elevated, mild bradycardia on initial vitals resolved on my exam.  Patient has some distention with generalized tenderness more prominent in the lower abdomen.  Patient was able to urinate independently in the emergency department, postvoid bladder scan with 160 mLs, will hold off on insertion of catheter given able to urinate independently with CT scan pending.  Small fluid bolus ordered as well as fentanyl and Zofran for further symptomatic care.  Additional history obtained:  Additional  history obtained from chart review & nursing note review.  Abdominal 1view x-ray yesterday:  Dilated small bowel loop is noted in left upper quadrant which may  represent focal ileus.   Lab Tests:  I reviewed and interpreted labs, which included:  CBC: Mild anemia present, has been present previously, no leukocytosis. CMP: Acute on chronic kidney dysfunction with a BUN of 69 and creatinine 3.57 today, most recently 2.42 and 27 respectively.  Several mild electrolyte abnormalities as above. Lipase: Within normal limits Urinalysis: Pending  Imaging Studies ordered:  I ordered imaging studies which included CT abdomen/pelvis without contrast, I independently reviewed, formal radiology report is pending at this  time.   Findings and plan of care discussed with supervising physician Dr. Sabra Heck who is in agreement & has assumed care of the patient @ change of shift @ 1900.    Portions of this note were generated with [employer]. Dictation errors may occur despite best attempts at proofreading.  Final Clinical Impression(s) / ED Diagnoses Final diagnoses:  None    Rx / DC Orders ED Discharge Orders     None        Amaryllis Dyke, PA-C 08/01/20 1903    Leafy Kindle 08/01/20 1904    Noemi Chapel, MD 08/01/20 Karl Bales    Noemi Chapel, MD 08/01/20 2257

## 2020-08-01 NOTE — ED Notes (Signed)
Pt moved to a room with a monitor

## 2020-08-01 NOTE — ED Provider Notes (Signed)
Emergency Medicine Provider Triage Evaluation Note  Crystal [name] , a 86 y.o. female  was evaluated in triage.  Pt complains of abd pain.  Review of Systems  Positive: Abd pain, acute urinary retention, constipation, nausea Negative: Fever, cp, sob, dysuria  Physical Exam  BP (!) 160/49 (BP Location: Right Arm)   Pulse (!) 58   Temp 98.9 F (37.2 C) (Oral)   Resp 18   Ht 5\' 4"  (1.626 m)   Wt 56.2 kg   SpO2 97%   BMI 21.28 kg/m  Gen:   Awake, uncomfortable Resp:  Normal effort  MSK:   Moves extremities without difficulty  Other:  TTP to LLQ and suprapubic with distended bladder  Medical Decision Making  Medically screening exam initiated at 4:08 PM.  Appropriate orders placed.  Crystal [name] was informed that the remainder of the evaluation will be completed by another provider, this initial triage assessment does not replace that evaluation, and the importance of remaining in the ED until their evaluation is complete.  Pt here with lower abd pain x 3 days, also feeling constipated and no BM for the same duration.  Also report urinary retention today, last urinate this AM.  Feels uncomfortable.    Domenic Moras, PA-C 08/01/20 1617    Noemi Chapel, MD 08/02/20 [phone]

## 2020-08-01 NOTE — ED Triage Notes (Signed)
Pt. States they are having abdominal pain for three days. Pt. Denies nausea and vomiting today.

## 2020-08-02 ENCOUNTER — Other Ambulatory Visit: Payer: Self-pay

## 2020-08-02 DIAGNOSIS — K660 Peritoneal adhesions (postprocedural) (postinfection): Secondary | ICD-10-CM | POA: Diagnosis present

## 2020-08-02 DIAGNOSIS — I5042 Chronic combined systolic (congestive) and diastolic (congestive) heart failure: Secondary | ICD-10-CM | POA: Diagnosis not present

## 2020-08-02 DIAGNOSIS — R0602 Shortness of breath: Secondary | ICD-10-CM | POA: Diagnosis not present

## 2020-08-02 DIAGNOSIS — K567 Ileus, unspecified: Secondary | ICD-10-CM | POA: Diagnosis not present

## 2020-08-02 DIAGNOSIS — K353 Acute appendicitis with localized peritonitis, without perforation or gangrene: Secondary | ICD-10-CM | POA: Diagnosis not present

## 2020-08-02 DIAGNOSIS — N184 Chronic kidney disease, stage 4 (severe): Secondary | ICD-10-CM | POA: Diagnosis not present

## 2020-08-02 DIAGNOSIS — N179 Acute kidney failure, unspecified: Secondary | ICD-10-CM

## 2020-08-02 DIAGNOSIS — R188 Other ascites: Secondary | ICD-10-CM | POA: Diagnosis not present

## 2020-08-02 DIAGNOSIS — I2510 Atherosclerotic heart disease of native coronary artery without angina pectoris: Secondary | ICD-10-CM | POA: Diagnosis not present

## 2020-08-02 DIAGNOSIS — E871 Hypo-osmolality and hyponatremia: Secondary | ICD-10-CM | POA: Diagnosis not present

## 2020-08-02 DIAGNOSIS — I701 Atherosclerosis of renal artery: Secondary | ICD-10-CM | POA: Diagnosis present

## 2020-08-02 DIAGNOSIS — K6389 Other specified diseases of intestine: Secondary | ICD-10-CM | POA: Diagnosis not present

## 2020-08-02 DIAGNOSIS — I081 Rheumatic disorders of both mitral and tricuspid valves: Secondary | ICD-10-CM | POA: Diagnosis present

## 2020-08-02 DIAGNOSIS — R339 Retention of urine, unspecified: Secondary | ICD-10-CM | POA: Diagnosis present

## 2020-08-02 DIAGNOSIS — K5730 Diverticulosis of large intestine without perforation or abscess without bleeding: Secondary | ICD-10-CM | POA: Diagnosis not present

## 2020-08-02 DIAGNOSIS — I517 Cardiomegaly: Secondary | ICD-10-CM | POA: Diagnosis not present

## 2020-08-02 DIAGNOSIS — K3189 Other diseases of stomach and duodenum: Secondary | ICD-10-CM | POA: Diagnosis not present

## 2020-08-02 DIAGNOSIS — K352 Acute appendicitis with generalized peritonitis, without abscess: Secondary | ICD-10-CM | POA: Diagnosis not present

## 2020-08-02 DIAGNOSIS — K650 Generalized (acute) peritonitis: Secondary | ICD-10-CM | POA: Diagnosis not present

## 2020-08-02 DIAGNOSIS — I2583 Coronary atherosclerosis due to lipid rich plaque: Secondary | ICD-10-CM | POA: Diagnosis not present

## 2020-08-02 DIAGNOSIS — R109 Unspecified abdominal pain: Secondary | ICD-10-CM | POA: Diagnosis not present

## 2020-08-02 DIAGNOSIS — K3521 Acute appendicitis with generalized peritonitis, with abscess: Secondary | ICD-10-CM | POA: Diagnosis not present

## 2020-08-02 DIAGNOSIS — K8020 Calculus of gallbladder without cholecystitis without obstruction: Secondary | ICD-10-CM | POA: Diagnosis not present

## 2020-08-02 DIAGNOSIS — I480 Paroxysmal atrial fibrillation: Secondary | ICD-10-CM

## 2020-08-02 DIAGNOSIS — E11649 Type 2 diabetes mellitus with hypoglycemia without coma: Secondary | ICD-10-CM | POA: Diagnosis not present

## 2020-08-02 DIAGNOSIS — K219 Gastro-esophageal reflux disease without esophagitis: Secondary | ICD-10-CM | POA: Diagnosis present

## 2020-08-02 DIAGNOSIS — Z20822 Contact with and (suspected) exposure to covid-19: Secondary | ICD-10-CM | POA: Diagnosis not present

## 2020-08-02 DIAGNOSIS — E876 Hypokalemia: Secondary | ICD-10-CM

## 2020-08-02 DIAGNOSIS — I130 Hypertensive heart and chronic kidney disease with heart failure and stage 1 through stage 4 chronic kidney disease, or unspecified chronic kidney disease: Secondary | ICD-10-CM | POA: Diagnosis not present

## 2020-08-02 DIAGNOSIS — J90 Pleural effusion, not elsewhere classified: Secondary | ICD-10-CM | POA: Diagnosis not present

## 2020-08-02 DIAGNOSIS — E1122 Type 2 diabetes mellitus with diabetic chronic kidney disease: Secondary | ICD-10-CM | POA: Diagnosis not present

## 2020-08-02 DIAGNOSIS — Z4659 Encounter for fitting and adjustment of other gastrointestinal appliance and device: Secondary | ICD-10-CM | POA: Diagnosis not present

## 2020-08-02 DIAGNOSIS — M109 Gout, unspecified: Secondary | ICD-10-CM | POA: Diagnosis present

## 2020-08-02 DIAGNOSIS — E782 Mixed hyperlipidemia: Secondary | ICD-10-CM | POA: Diagnosis present

## 2020-08-02 DIAGNOSIS — K3532 Acute appendicitis with perforation and localized peritonitis, without abscess: Secondary | ICD-10-CM | POA: Diagnosis not present

## 2020-08-02 DIAGNOSIS — K35891 Other acute appendicitis without perforation, with gangrene: Secondary | ICD-10-CM | POA: Diagnosis not present

## 2020-08-02 DIAGNOSIS — D509 Iron deficiency anemia, unspecified: Secondary | ICD-10-CM | POA: Diagnosis not present

## 2020-08-02 DIAGNOSIS — R001 Bradycardia, unspecified: Secondary | ICD-10-CM | POA: Diagnosis present

## 2020-08-02 LAB — COMPREHENSIVE METABOLIC PANEL
ALT: 30 U/L (ref 0–44)
AST: 30 U/L (ref 15–41)
Albumin: 2.8 g/dL — ABNORMAL LOW (ref 3.5–5.0)
Alkaline Phosphatase: 49 U/L (ref 38–126)
Anion gap: 13 (ref 5–15)
BUN: 66 mg/dL — ABNORMAL HIGH (ref 8–23)
CO2: 19 mmol/L — ABNORMAL LOW (ref 22–32)
Calcium: 8.4 mg/dL — ABNORMAL LOW (ref 8.9–10.3)
Chloride: 96 mmol/L — ABNORMAL LOW (ref 98–111)
Creatinine, Ser: 3.36 mg/dL — ABNORMAL HIGH (ref 0.44–1.00)
GFR, Estimated: 13 mL/min — ABNORMAL LOW (ref >60)
Glucose, Bld: 74 mg/dL (ref 70–99)
Potassium: 3.4 mmol/L — ABNORMAL LOW (ref 3.5–5.1)
Sodium: 128 mmol/L — ABNORMAL LOW (ref 135–145)
Total Bilirubin: 1.1 mg/dL (ref 0.3–1.2)
Total Protein: 5.4 g/dL — ABNORMAL LOW (ref 6.5–8.1)

## 2020-08-02 LAB — URINALYSIS, COMPLETE (UACMP) WITH MICROSCOPIC
Bilirubin Urine: NEGATIVE
Glucose, UA: NEGATIVE mg/dL
Ketones, ur: NEGATIVE mg/dL
Leukocytes,Ua: NEGATIVE
Nitrite: NEGATIVE
Protein, ur: 100 mg/dL — AB
Specific Gravity, Urine: 1.009 (ref 1.005–1.030)
pH: 5 (ref 5.0–8.0)

## 2020-08-02 LAB — CBC
HCT: 27.7 % — ABNORMAL LOW (ref 36.0–46.0)
Hemoglobin: 9.2 g/dL — ABNORMAL LOW (ref 12.0–15.0)
MCH: 28 pg (ref 26.0–34.0)
MCHC: 33.2 g/dL (ref 30.0–36.0)
MCV: 84.2 fL (ref 80.0–100.0)
Platelets: 151 10*3/uL (ref 150–400)
RBC: 3.29 MIL/uL — ABNORMAL LOW (ref 3.87–5.11)
RDW: 16.9 % — ABNORMAL HIGH (ref 11.5–15.5)
WBC: 9.1 10*3/uL (ref 4.0–10.5)
nRBC: 0 % (ref 0.0–0.2)

## 2020-08-02 LAB — HEMOGLOBIN A1C
Hgb A1c MFr Bld: 5.8 % — ABNORMAL HIGH (ref 4.8–5.6)
Mean Plasma Glucose: 119.76 mg/dL

## 2020-08-02 LAB — MAGNESIUM: Magnesium: 1.7 mg/dL (ref 1.7–2.4)

## 2020-08-02 LAB — GLUCOSE, CAPILLARY: Glucose-Capillary: 77 mg/dL (ref 70–99)

## 2020-08-02 MED ORDER — HYDRALAZINE HCL 20 MG/ML IJ SOLN
10.0000 mg | INTRAMUSCULAR | Status: DC | PRN
  Administered 2020-08-04: 10 mg via INTRAVENOUS
  Filled 2020-08-02: qty 1

## 2020-08-02 MED ORDER — POTASSIUM CHLORIDE IN NACL 20-0.9 MEQ/L-% IV SOLN: INTRAVENOUS | Status: DC

## 2020-08-02 MED ORDER — ISOSORBIDE MONONITRATE ER 60 MG PO TB24
120.0000 mg | ORAL_TABLET | Freq: Every day | ORAL | Status: DC
  Administered 2020-08-02: 120 mg via ORAL
  Filled 2020-08-02: qty 2

## 2020-08-02 MED ORDER — ONDANSETRON HCL 4 MG/2ML IJ SOLN
4.0000 mg | Freq: Once | INTRAMUSCULAR | Status: AC
  Administered 2020-08-02: 4 mg via INTRAVENOUS
  Filled 2020-08-02: qty 2

## 2020-08-02 MED ORDER — POTASSIUM CHLORIDE 10 MEQ/100ML IV SOLN
10.0000 meq | INTRAVENOUS | Status: AC
  Administered 2020-08-02 (×2): 10 meq via INTRAVENOUS
  Filled 2020-08-02 (×4): qty 100

## 2020-08-02 MED ORDER — POTASSIUM CHLORIDE 10 MEQ/100ML IV SOLN
10.0000 meq | INTRAVENOUS | Status: AC
  Administered 2020-08-03 (×2): 10 meq via INTRAVENOUS

## 2020-08-02 MED ORDER — CARVEDILOL 12.5 MG PO TABS
25.0000 mg | ORAL_TABLET | Freq: Two times a day (BID) | ORAL | Status: DC
  Administered 2020-08-02: 25 mg via ORAL
  Filled 2020-08-02 (×2): qty 2

## 2020-08-02 MED ORDER — FENTANYL CITRATE (PF) 100 MCG/2ML IJ SOLN
25.0000 ug | Freq: Once | INTRAMUSCULAR | Status: AC
  Administered 2020-08-02: 25 ug via INTRAVENOUS
  Filled 2020-08-02: qty 2

## 2020-08-02 MED ORDER — MORPHINE SULFATE (PF) 2 MG/ML IV SOLN
1.0000 mg | INTRAVENOUS | Status: DC | PRN
  Administered 2020-08-02 – 2020-08-03 (×4): 1 mg via INTRAVENOUS
  Filled 2020-08-02 (×5): qty 1

## 2020-08-02 MED ORDER — AMIODARONE HCL 200 MG PO TABS
200.0000 mg | ORAL_TABLET | Freq: Every day | ORAL | Status: DC
  Administered 2020-08-02 – 2020-08-10 (×8): 200 mg via ORAL
  Filled 2020-08-02 (×8): qty 1

## 2020-08-02 MED ORDER — BISACODYL 10 MG RE SUPP
10.0000 mg | Freq: Two times a day (BID) | RECTAL | Status: DC
  Administered 2020-08-02 – 2020-08-06 (×8): 10 mg via RECTAL
  Filled 2020-08-02 (×11): qty 1

## 2020-08-02 NOTE — Progress Notes (Addendum)
PROGRESS NOTE    Crystal [name]   JOI:[id number]  DOB: 04/04/1934  DOA: 08/01/2020 PCP: Glenda Chroman, MD   Brief Narrative:  Crystal [name] 86 y.o. female with medical history significant of iron deficiency anemia, Bell's palsy, history of breast cancer and right mastectomy, CAD, history of STEMI, chronic systolic heart failure, paroxysmal atrial fibrillation, stage IV chronic kidney disease, essential hypertension, GERD, gout, history of squamous cell carcinoma of the skin, mixed hyperlipidemia, osteopenia, thyroid nodule, type 2 diabetes mellitus who is coming to the emergency department due to abdominal pain, bloating with frequent dry heaving for the past 3 days.  He saw her primary care physician and a KUB revealed dilated bowel loops.  She presented to the ED due to increasing abdominal distention pain and dry heaving.  She had not had any vomiting.  CT abdomen pelvis: Although poorly visualized, there is suspected abnormal appendix in the right lower quadrant suggesting acute appendicitis.  Multiple dilated loops of small bowel in the left abdomen, sigmoid diverticulosis without diverticulitis, small to moderate abdominal pelvic ascites including fluid in the right lower quadrant. General surgery consulted and patient started on IV antibiotics.  Subjective: She continues to have abdominal pain in various parts of her abdomen.  No bowel movement.    Assessment & Plan:   Principal Problem:   Ileus ([place]) in setting of possible appendicitis and hyponatremia and hypokalemia -Continue to keep n.p.o. except for necessary medications -It is not clear but she may have an appendicitis-General surgery would like to continue IV antibiotics (Cipro and Flagyl) and will follow - Continue normal saline, replace electrolytes aggressively and hold Demadex (takes 40 mg daily) - Holding Eliquis for possible surgery - last taken on 7/5 -Continue as needed IV morphine, IV Protonix, as needed IV  Zofran -General surgery has ordered a UA due to suprapubic pain and a twice daily Dulcolax suppository  Active Problems:  Hyponatremia-acute kidney injury superimposed on chronic kidney disease stage IV - Baseline creatinine is 2.0-2.4 - Presented with a creatinine of 3.57 and a sodium of 127 - May be prerenal-holding Demadex hydrating  Hypokalemia - Replace via IV runs and an IV fluid and check magnesium  Paroxysmal atrial fibrillation - Holding Eliquis as mentioned - Follow on telemetry -Resume amiodarone and carvedilol  Glucose intolerance - Last A1c 5.8-not on any medications at home according to her med list -CBGs ordered   Hyperlipidemia -Hold statin for now    CAD (coronary artery disease) with DES to mid LAD 1/21 Hypertension, chronic diastolic heart failure grade 2 -Hold Crestor, hydralazine, amlodipine-resumed carvedilol and Imdur-follow BP -Plavix stopped by her cardiologist on 06/07/2020 due to significant amount of bruising and the fact that she was already on Eliquis -Last echo in 06/05/2020 revealed grade 2 diastolic dysfunction, mildly elevated pulmonary artery systolic pressure, mild to moderate mitral regurgitation, moderate tricuspid regurgitation    Iron deficiency anemia -Hold iron tablets  History of breast cancer status post right-sided mastectomy    Time spent in minutes: 40 DVT prophylaxis: Place and maintain sequential compression device Start: 08/02/20 0800  Code Status: Full code Family Communication:  Level of Care: Level of care: Telemetry Disposition Plan:  Status is: Inpatient  Remains inpatient appropriate because:IV treatments appropriate due to intensity of illness or inability to take PO  Dispo: The patient is from: Home              Anticipated d/c is to: Home  Patient currently is not medically stable to d/c.   Difficult to place patient No      Consultants:  General surgery Procedures:  None Antimicrobials:   Anti-infectives (From admission, onward)    Start     Dose/Rate Route Frequency Ordered Stop   08/02/20 1100  ciprofloxacin (CIPRO) IVPB 400 mg        400 mg 200 mL/hr over 60 Minutes Intravenous Every 12 hours 08/01/20 2219 08/07/20 1059   08/02/20 0600  metroNIDAZOLE (FLAGYL) IVPB 500 mg        500 mg 100 mL/hr over 60 Minutes Intravenous Every 8 hours 08/01/20 2219     08/01/20 2145  ciprofloxacin (CIPRO) IVPB 400 mg        400 mg 200 mL/hr over 60 Minutes Intravenous  Once 08/01/20 2139 08/01/20 2337   08/01/20 2145  metroNIDAZOLE (FLAGYL) IVPB 500 mg        500 mg 100 mL/hr over 60 Minutes Intravenous  Once 08/01/20 2139 08/01/20 2324        Objective: Vitals:   08/02/20 0300 08/02/20 0400 08/02/20 0600 08/02/20 1338  BP: (!) 165/46 (!) 169/47 (!) 169/45 (!) 173/50  Pulse: 60 61 61 63  Resp: 18 20 12 17   Temp:    98.6 F (37 C)  TempSrc:    Oral  SpO2: 97% 96% 96% 96%  Weight:      Height:        Intake/Output Summary (Last 24 hours) at 08/02/2020 1505 Last data filed at 08/02/2020 0900 Gross per 24 hour  Intake 740 ml  Output --  Net 740 ml   Filed Weights   08/01/20 1546  Weight: 56.2 kg    Examination: General exam: Appears comfortable  HEENT: PERRLA, oral mucosa moist, no sclera icterus or thrush Respiratory system: Clear to auscultation. Respiratory effort normal. Cardiovascular system: S1 & S2 heard, IIRR.   Gastrointestinal system: Abdomen soft, tender across the lower abdomen and periumbilical area, mildly distended.  No bowel sounds. Central nervous system: Alert and oriented. No focal neurological deficits. Extremities: No cyanosis, clubbing or edema Skin: No rashes or ulcers Psychiatry:  Mood & affect appropriate.     Data Reviewed: I have personally reviewed following labs and imaging studies  CBC: Recent Labs  Lab 08/01/20 1508 08/02/20 0440  WBC 8.5 9.1  NEUTROABS 7.7  --   HGB 11.1* 9.2*  HCT 33.6* 27.7*  MCV 85.7 84.2  PLT 170  485   Basic Metabolic Panel: Recent Labs  Lab 08/01/20 1508 08/02/20 0440  NA 127* 128*  K 4.6 3.4*  CL 94* 96*  CO2 21* 19*  GLUCOSE 106* 74  BUN 69* 66*  CREATININE 3.57* 3.36*  CALCIUM 9.0 8.4*   GFR: Estimated Creatinine Clearance: 11 mL/min (A) (by C-G formula based on SCr of 3.36 mg/dL (H)). Liver Function Tests: Recent Labs  Lab 08/01/20 1508 08/02/20 0440  AST 50* 30  ALT 43 30  ALKPHOS 66 49  BILITOT 1.2 1.1  PROT 6.7 5.4*  ALBUMIN 3.6 2.8*   Recent Labs  Lab 08/01/20 1508  LIPASE 21   No results for input(s): AMMONIA in the last 168 hours. Coagulation Profile: No results for input(s): INR, PROTIME in the last 168 hours. Cardiac Enzymes: No results for input(s): CKTOTAL, CKMB, CKMBINDEX, TROPONINI in the last 168 hours. BNP (last 3 results) No results for input(s): PROBNP in the last 8760 hours. HbA1C: Recent Labs    08/02/20 0440  HGBA1C 5.8*  CBG: No results for input(s): GLUCAP in the last 168 hours. Lipid Profile: No results for input(s): CHOL, HDL, LDLCALC, TRIG, CHOLHDL, LDLDIRECT in the last 72 hours. Thyroid Function Tests: No results for input(s): TSH, T4TOTAL, FREET4, T3FREE, THYROIDAB in the last 72 hours. Anemia Panel: No results for input(s): VITAMINB12, FOLATE, FERRITIN, TIBC, IRON, RETICCTPCT in the last 72 hours. Urine analysis:    Component Value Date/Time   COLORURINE YELLOW 08/02/2020 0947   APPEARANCEUR CLEAR 08/02/2020 0947   LABSPEC 1.009 08/02/2020 0947   PHURINE 5.0 08/02/2020 0947   GLUCOSEU NEGATIVE 08/02/2020 0947   HGBUR SMALL (A) 08/02/2020 0947   BILIRUBINUR NEGATIVE 08/02/2020 0947   KETONESUR NEGATIVE 08/02/2020 0947   PROTEINUR 100 (A) 08/02/2020 0947   NITRITE NEGATIVE 08/02/2020 0947   LEUKOCYTESUR NEGATIVE 08/02/2020 0947   Sepsis Labs: @LABRCNTIP (procalcitonin:4,lacticidven:4) ) Recent Results (from the past 240 hour(s))  Resp Panel by RT-PCR (Flu A&B, Covid) Nasopharyngeal Swab     Status: None    Collection Time: 08/01/20  8:03 PM   Specimen: Nasopharyngeal Swab; Nasopharyngeal(NP) swabs in vial transport medium  Result Value Ref Range Status   SARS Coronavirus 2 by RT PCR NEGATIVE NEGATIVE Final    Comment: (NOTE) SARS-CoV-2 target nucleic acids are NOT DETECTED.  The SARS-CoV-2 RNA is generally detectable in upper respiratory specimens during the acute phase of infection. The lowest concentration of SARS-CoV-2 viral copies this assay can detect is 138 copies/mL. A negative result does not preclude SARS-Cov-2 infection and should not be used as the sole basis for treatment or other patient management decisions. A negative result may occur with  improper specimen collection/handling, submission of specimen other than nasopharyngeal swab, presence of viral mutation(s) within the areas targeted by this assay, and inadequate number of viral copies(<138 copies/mL). A negative result must be combined with clinical observations, patient history, and epidemiological information. The expected result is Negative.  Fact Sheet for Patients:  EntrepreneurPulse.com.au  Fact Sheet for Healthcare Providers:  IncredibleEmployment.be  This test is no t yet approved or cleared by the [place] FDA and  has been authorized for detection and/or diagnosis of SARS-CoV-2 by FDA under an Emergency Use Authorization (EUA). This EUA will remain  in effect (meaning this test can be used) for the duration of the COVID-19 declaration under Section 564(b)(1) of the Act, 21 U.S.C.section 360bbb-3(b)(1), unless the authorization is terminated  or revoked sooner.       Influenza A by PCR NEGATIVE NEGATIVE Final   Influenza B by PCR NEGATIVE NEGATIVE Final    Comment: (NOTE) The Xpert Xpress SARS-CoV-2/FLU/RSV plus assay is intended as an aid in the diagnosis of influenza from Nasopharyngeal swab specimens and should not be used as a sole basis for treatment.  Nasal washings and aspirates are unacceptable for Xpert Xpress SARS-CoV-2/FLU/RSV testing.  Fact Sheet for Patients: EntrepreneurPulse.com.au  Fact Sheet for Healthcare Providers: IncredibleEmployment.be  This test is not yet approved or cleared by the [place] FDA and has been authorized for detection and/or diagnosis of SARS-CoV-2 by FDA under an Emergency Use Authorization (EUA). This EUA will remain in effect (meaning this test can be used) for the duration of the COVID-19 declaration under Section 564(b)(1) of the Act, 21 U.S.C. section 360bbb-3(b)(1), unless the authorization is terminated or revoked.  Performed at [facility], [address], [place], [place] [address]          Radiology Studies: CT ABDOMEN PELVIS WO CONTRAST  Result Date: 08/01/2020 CLINICAL DATA:  Abdominal pain,  constipation EXAM: CT ABDOMEN AND PELVIS WITHOUT CONTRAST TECHNIQUE: Multidetector CT imaging of the abdomen and pelvis was performed following the standard protocol without IV contrast. COMPARISON:  CTA abdomen/pelvis dated 04/16/2019 FINDINGS: Lower chest: Small right pleural effusion. Hepatobiliary: Unenhanced liver is grossly unremarkable. Layering gallstone (series 2/image 29), without associated inflammatory changes. No intrahepatic or extrahepatic ductal dilatation. Pancreas: Within normal limits. Spleen: Within normal limits. Adrenals/Urinary Tract: Adrenal glands are within normal limits. Kidneys are within normal limits. No renal, ureteral, or bladder calculi. No hydronephrosis. Bladder is within normal limits. Stomach/Bowel: Stomach is notable for a small hiatal hernia. Multiple dilated loops of small bowel in the left mid abdomen. Given the additional findings, this favors reactive small bowel ileus over small bowel obstruction. Abnormal blind-ending tubular structure in the right lower quadrant (series 2/image 83) is poorly visualized/partially  obscured by fluid but favors an abnormal appendix, suggesting acute appendicitis. This is distinct from the terminal ileum (series 2/image 56). Sigmoid diverticulosis, without evidence of diverticulitis. Vascular/Lymphatic: No evidence of abdominal aortic aneurysm. Atherosclerotic calcifications of the abdominal aorta and branch vessels. No suspicious abdominopelvic lymphadenopathy. Reproductive: Uterine fibroids. No adnexal masses. Other: Small to moderate abdominopelvic ascites, including fluid in the right lower quadrant which partially obscures the suspected appendix. No free air to suggest macroscopic perforation. Musculoskeletal: Degenerative changes of the visualized thoracolumbar spine. IMPRESSION: Although poorly visualized, there is a suspected abnormal appendix in the right lower quadrant, suggesting acute appendicitis. No free air to suggest macroscopic perforation. Multiple dilated loops of small bowel in the left mid abdomen. In this clinical setting, this appearance favors reactive small bowel ileus over small bowel obstruction. Sigmoid diverticulosis, without evidence of diverticulitis. Small to moderate abdominopelvic ascites, including fluid in the right lower quadrant. Electronically Signed   By: Julian Hy M.D.   On: 08/01/2020 19:06      Scheduled Meds:  amiodarone  200 mg Oral Daily   bisacodyl  10 mg Rectal BID   carvedilol  25 mg Oral BID WC   isosorbide mononitrate  120 mg Oral QHS   pantoprazole (PROTONIX) IV  40 mg Intravenous Q24H   Continuous Infusions:  0.9 % NaCl with KCl 20 mEq / L     ciprofloxacin 400 mg (08/02/20 0855)   metronidazole 500 mg (08/02/20 1212)   potassium chloride       LOS: 0 days      Debbe Odea, MD [facility] Pager: [email or web address] 08/02/2020, 3:05 PM

## 2020-08-02 NOTE — Progress Notes (Signed)
Subjective: Patient states she slightly feels better.  Her abdominal pain is across the lower portion of her abdomen.  She denies any bowel movement or flatus.  She has not had an episode of emesis.  Objective: Vital signs in last 24 hours: Temp:  [98.9 F (37.2 C)] 98.9 F (37.2 C) (07/06 1545) Pulse Rate:  [56-61] 61 (07/07 0600) Resp:  [12-25] 12 (07/07 0600) BP: (150-176)/(41-58) 169/45 (07/07 0600) SpO2:  [95 %-97 %] 96 % (07/07 0600) Weight:  [56.2 kg] 56.2 kg (07/06 1546)    Intake/Output from previous day: 07/06 0701 - 07/07 0700 In: 740 [IV Piggyback:740] Out: -  Intake/Output this shift: No intake/output data recorded.  General appearance: alert, cooperative, and no distress GI: Soft but distended.  Not tense.  Minimal bowel sounds appreciated.  She is tender to palpation along the lower half of the abdomen, specifically more in the suprapubic region.  There is some right lower quadrant discomfort but not point tenderness.  No rigidity is noted.  Lab Results:  Recent Labs    08/01/20 1508 08/02/20 0440  WBC 8.5 9.1  HGB 11.1* 9.2*  HCT 33.6* 27.7*  PLT 170 151   BMET Recent Labs    08/01/20 1508 08/02/20 0440  NA 127* 128*  K 4.6 3.4*  CL 94* 96*  CO2 21* 19*  GLUCOSE 106* 74  BUN 69* 66*  CREATININE 3.57* 3.36*  CALCIUM 9.0 8.4*   PT/INR No results for input(s): LABPROT, INR in the last 72 hours.  Studies/Results: CT ABDOMEN PELVIS WO CONTRAST  Result Date: 08/01/2020 CLINICAL DATA:  Abdominal pain, constipation EXAM: CT ABDOMEN AND PELVIS WITHOUT CONTRAST TECHNIQUE: Multidetector CT imaging of the abdomen and pelvis was performed following the standard protocol without IV contrast. COMPARISON:  CTA abdomen/pelvis dated 04/16/2019 FINDINGS: Lower chest: Small right pleural effusion. Hepatobiliary: Unenhanced liver is grossly unremarkable. Layering gallstone (series 2/image 29), without associated inflammatory changes. No intrahepatic or  extrahepatic ductal dilatation. Pancreas: Within normal limits. Spleen: Within normal limits. Adrenals/Urinary Tract: Adrenal glands are within normal limits. Kidneys are within normal limits. No renal, ureteral, or bladder calculi. No hydronephrosis. Bladder is within normal limits. Stomach/Bowel: Stomach is notable for a small hiatal hernia. Multiple dilated loops of small bowel in the left mid abdomen. Given the additional findings, this favors reactive small bowel ileus over small bowel obstruction. Abnormal blind-ending tubular structure in the right lower quadrant (series 2/image 83) is poorly visualized/partially obscured by fluid but favors an abnormal appendix, suggesting acute appendicitis. This is distinct from the terminal ileum (series 2/image 56). Sigmoid diverticulosis, without evidence of diverticulitis. Vascular/Lymphatic: No evidence of abdominal aortic aneurysm. Atherosclerotic calcifications of the abdominal aorta and branch vessels. No suspicious abdominopelvic lymphadenopathy. Reproductive: Uterine fibroids. No adnexal masses. Other: Small to moderate abdominopelvic ascites, including fluid in the right lower quadrant which partially obscures the suspected appendix. No free air to suggest macroscopic perforation. Musculoskeletal: Degenerative changes of the visualized thoracolumbar spine. IMPRESSION: Although poorly visualized, there is a suspected abnormal appendix in the right lower quadrant, suggesting acute appendicitis. No free air to suggest macroscopic perforation. Multiple dilated loops of small bowel in the left mid abdomen. In this clinical setting, this appearance favors reactive small bowel ileus over small bowel obstruction. Sigmoid diverticulosis, without evidence of diverticulitis. Small to moderate abdominopelvic ascites, including fluid in the right lower quadrant. Electronically Signed   By: Julian Hy M.D.   On: 08/01/2020 19:06    Anti-infectives: Anti-infectives  (From admission, onward)  Start     Dose/Rate Route Frequency Ordered Stop   08/02/20 1100  ciprofloxacin (CIPRO) IVPB 400 mg        400 mg 200 mL/hr over 60 Minutes Intravenous Every 12 hours 08/01/20 2219 08/07/20 1059   08/02/20 0600  metroNIDAZOLE (FLAGYL) IVPB 500 mg        500 mg 100 mL/hr over 60 Minutes Intravenous Every 8 hours 08/01/20 2219     08/01/20 2145  ciprofloxacin (CIPRO) IVPB 400 mg        400 mg 200 mL/hr over 60 Minutes Intravenous  Once 08/01/20 2139 08/01/20 2337   08/01/20 2145  metroNIDAZOLE (FLAGYL) IVPB 500 mg        500 mg 100 mL/hr over 60 Minutes Intravenous  Once 08/01/20 2139 08/01/20 2324       Assessment/Plan: Impression: Ileus of unknown etiology.  Acute appendicitis less likely given the clinical course.  CT findings may be reactive in nature.  She also has a history of sigmoid diverticulosis.  Her white blood cell count continues to be normal.  Her renal function has not recovered significantly.  Have ordered urinalysis.  Would continue IV antibiotics for now.  No need for acute surgical invention at this time.  Continue to hold Eliquis.  Will notify covering surgeons about her as I will be unavailable later on today and will not be returning until Monday.  LOS: 0 days    Aviva Signs 08/02/2020

## 2020-08-03 ENCOUNTER — Inpatient Hospital Stay (HOSPITAL_COMMUNITY): Payer: Medicare Other | Admitting: Anesthesiology

## 2020-08-03 ENCOUNTER — Encounter (HOSPITAL_COMMUNITY)
Admission: EM | Disposition: A | Discharge status: Home Health | Payer: Self-pay | Source: Home / Self Care | Attending: Internal Medicine

## 2020-08-03 ENCOUNTER — Encounter (HOSPITAL_COMMUNITY): Payer: Self-pay | Admitting: Internal Medicine

## 2020-08-03 ENCOUNTER — Inpatient Hospital Stay (HOSPITAL_COMMUNITY): Payer: Medicare Other

## 2020-08-03 DIAGNOSIS — I2510 Atherosclerotic heart disease of native coronary artery without angina pectoris: Secondary | ICD-10-CM

## 2020-08-03 DIAGNOSIS — K35891 Other acute appendicitis without perforation, with gangrene: Secondary | ICD-10-CM

## 2020-08-03 DIAGNOSIS — K3521 Acute appendicitis with generalized peritonitis, with abscess: Secondary | ICD-10-CM

## 2020-08-03 DIAGNOSIS — K353 Acute appendicitis with localized peritonitis, without perforation or gangrene: Secondary | ICD-10-CM | POA: Diagnosis not present

## 2020-08-03 DIAGNOSIS — Z4659 Encounter for fitting and adjustment of other gastrointestinal appliance and device: Secondary | ICD-10-CM

## 2020-08-03 DIAGNOSIS — I2583 Coronary atherosclerosis due to lipid rich plaque: Secondary | ICD-10-CM

## 2020-08-03 DIAGNOSIS — K650 Generalized (acute) peritonitis: Secondary | ICD-10-CM

## 2020-08-03 HISTORY — PX: LAPAROSCOPIC APPENDECTOMY: SHX408

## 2020-08-03 LAB — GLUCOSE, CAPILLARY
Glucose-Capillary: 113 mg/dL — ABNORMAL HIGH (ref 70–99)
Glucose-Capillary: 119 mg/dL — ABNORMAL HIGH (ref 70–99)
Glucose-Capillary: 129 mg/dL — ABNORMAL HIGH (ref 70–99)
Glucose-Capillary: 132 mg/dL — ABNORMAL HIGH (ref 70–99)
Glucose-Capillary: 151 mg/dL — ABNORMAL HIGH (ref 70–99)
Glucose-Capillary: 163 mg/dL — ABNORMAL HIGH (ref 70–99)
Glucose-Capillary: 172 mg/dL — ABNORMAL HIGH (ref 70–99)
Glucose-Capillary: 172 mg/dL — ABNORMAL HIGH (ref 70–99)
Glucose-Capillary: 59 mg/dL — ABNORMAL LOW (ref 70–99)

## 2020-08-03 LAB — CBC
HCT: 26.7 % — ABNORMAL LOW (ref 36.0–46.0)
Hemoglobin: 8.9 g/dL — ABNORMAL LOW (ref 12.0–15.0)
MCH: 28 pg (ref 26.0–34.0)
MCHC: 33.3 g/dL (ref 30.0–36.0)
MCV: 84 fL (ref 80.0–100.0)
Platelets: 157 10*3/uL (ref 150–400)
RBC: 3.18 MIL/uL — ABNORMAL LOW (ref 3.87–5.11)
RDW: 16.9 % — ABNORMAL HIGH (ref 11.5–15.5)
WBC: 10 10*3/uL (ref 4.0–10.5)
nRBC: 0 % (ref 0.0–0.2)

## 2020-08-03 LAB — BASIC METABOLIC PANEL
Anion gap: 11 (ref 5–15)
BUN: 63 mg/dL — ABNORMAL HIGH (ref 8–23)
CO2: 19 mmol/L — ABNORMAL LOW (ref 22–32)
Calcium: 8.3 mg/dL — ABNORMAL LOW (ref 8.9–10.3)
Chloride: 102 mmol/L (ref 98–111)
Creatinine, Ser: 2.79 mg/dL — ABNORMAL HIGH (ref 0.44–1.00)
GFR, Estimated: 16 mL/min — ABNORMAL LOW (ref >60)
Glucose, Bld: 70 mg/dL (ref 70–99)
Potassium: 3.8 mmol/L (ref 3.5–5.1)
Sodium: 132 mmol/L — ABNORMAL LOW (ref 135–145)

## 2020-08-03 SURGERY — APPENDECTOMY, LAPAROSCOPIC
Anesthesia: General | Site: Abdomen

## 2020-08-03 MED ORDER — FENTANYL CITRATE (PF) 100 MCG/2ML IJ SOLN
INTRAMUSCULAR | Status: AC
  Filled 2020-08-03: qty 2

## 2020-08-03 MED ORDER — DEXAMETHASONE SODIUM PHOSPHATE 10 MG/ML IJ SOLN
INTRAMUSCULAR | Status: AC
  Filled 2020-08-03: qty 1

## 2020-08-03 MED ORDER — ONDANSETRON HCL 4 MG/2ML IJ SOLN
INTRAMUSCULAR | Status: AC
  Filled 2020-08-03: qty 2

## 2020-08-03 MED ORDER — CIPROFLOXACIN IN D5W 400 MG/200ML IV SOLN
400.0000 mg | INTRAVENOUS | Status: DC
  Administered 2020-08-04 – 2020-08-06 (×3): 400 mg via INTRAVENOUS
  Filled 2020-08-03 (×3): qty 200

## 2020-08-03 MED ORDER — ROCURONIUM BROMIDE 10 MG/ML (PF) SYRINGE
PREFILLED_SYRINGE | INTRAVENOUS | Status: DC | PRN
  Administered 2020-08-03: 30 mg via INTRAVENOUS

## 2020-08-03 MED ORDER — LIDOCAINE 2% (20 MG/ML) 5 ML SYRINGE
INTRAMUSCULAR | Status: DC | PRN
  Administered 2020-08-03: 60 mg via INTRAVENOUS

## 2020-08-03 MED ORDER — SUCCINYLCHOLINE CHLORIDE 200 MG/10ML IV SOSY
PREFILLED_SYRINGE | INTRAVENOUS | Status: DC | PRN
  Administered 2020-08-03: 100 mg via INTRAVENOUS

## 2020-08-03 MED ORDER — LACTATED RINGERS IV SOLN: INTRAVENOUS | Status: DC

## 2020-08-03 MED ORDER — LIDOCAINE HCL (PF) 2 % IJ SOLN
INTRAMUSCULAR | Status: AC
  Filled 2020-08-03: qty 5

## 2020-08-03 MED ORDER — LACTATED RINGERS IV BOLUS
500.0000 mL | Freq: Once | INTRAVENOUS | Status: AC
  Administered 2020-08-03: 500 mL via INTRAVENOUS

## 2020-08-03 MED ORDER — METOPROLOL TARTRATE 5 MG/5ML IV SOLN
5.0000 mg | Freq: Four times a day (QID) | INTRAVENOUS | Status: DC
  Administered 2020-08-03 – 2020-08-06 (×12): 5 mg via INTRAVENOUS
  Filled 2020-08-03 (×13): qty 5

## 2020-08-03 MED ORDER — ORAL CARE MOUTH RINSE: 15.0000 mL | Freq: Once | OROMUCOSAL | Status: AC

## 2020-08-03 MED ORDER — KCL IN DEXTROSE-NACL 20-5-0.9 MEQ/L-%-% IV SOLN: INTRAVENOUS | Status: DC

## 2020-08-03 MED ORDER — BUPIVACAINE-EPINEPHRINE (PF) 0.25% -1:200000 IJ SOLN
INTRAMUSCULAR | Status: DC | PRN
  Administered 2020-08-03: 3 mL via PERINEURAL
  Administered 2020-08-03: 21 mL via PERINEURAL
  Administered 2020-08-03 (×2): 3 mL via PERINEURAL

## 2020-08-03 MED ORDER — HYDROMORPHONE HCL 1 MG/ML IJ SOLN
0.2500 mg | INTRAMUSCULAR | Status: DC | PRN
  Administered 2020-08-03 (×3): 0.5 mg via INTRAVENOUS
  Filled 2020-08-03 (×3): qty 0.5

## 2020-08-03 MED ORDER — NEOSTIGMINE METHYLSULFATE 3 MG/3ML IV SOSY
PREFILLED_SYRINGE | INTRAVENOUS | Status: DC | PRN
  Administered 2020-08-03: 3 mg via INTRAVENOUS

## 2020-08-03 MED ORDER — ESMOLOL HCL 100 MG/10ML IV SOLN
INTRAVENOUS | Status: DC | PRN
  Administered 2020-08-03: 20 mg via INTRAVENOUS
  Administered 2020-08-03: 10 mg via INTRAVENOUS

## 2020-08-03 MED ORDER — DEXAMETHASONE SODIUM PHOSPHATE 4 MG/ML IJ SOLN
INTRAMUSCULAR | Status: DC | PRN
  Administered 2020-08-03: 4 mg via INTRAVENOUS

## 2020-08-03 MED ORDER — ONDANSETRON HCL 4 MG/2ML IJ SOLN
INTRAMUSCULAR | Status: DC | PRN
  Administered 2020-08-03: 4 mg via INTRAVENOUS

## 2020-08-03 MED ORDER — SODIUM CHLORIDE 0.9 % IR SOLN
Status: DC | PRN
  Administered 2020-08-03: 1000 mL
  Administered 2020-08-03 (×3): 3000 mL

## 2020-08-03 MED ORDER — DEXTROSE 50 % IV SOLN: 12.5000 g | INTRAVENOUS | Status: AC

## 2020-08-03 MED ORDER — CHLORHEXIDINE GLUCONATE 0.12 % MT SOLN
15.0000 mL | Freq: Once | OROMUCOSAL | Status: AC
  Administered 2020-08-03: 15 mL via OROMUCOSAL

## 2020-08-03 MED ORDER — NEOSTIGMINE METHYLSULFATE 3 MG/3ML IV SOSY
PREFILLED_SYRINGE | INTRAVENOUS | Status: AC
  Filled 2020-08-03: qty 3

## 2020-08-03 MED ORDER — SUCCINYLCHOLINE CHLORIDE 200 MG/10ML IV SOSY
PREFILLED_SYRINGE | INTRAVENOUS | Status: AC
  Filled 2020-08-03: qty 10

## 2020-08-03 MED ORDER — PROPOFOL 10 MG/ML IV BOLUS
INTRAVENOUS | Status: AC
  Filled 2020-08-03: qty 20

## 2020-08-03 MED ORDER — BUPIVACAINE-EPINEPHRINE (PF) 0.25% -1:200000 IJ SOLN
INTRAMUSCULAR | Status: AC
  Filled 2020-08-03: qty 30

## 2020-08-03 MED ORDER — GLYCOPYRROLATE PF 0.2 MG/ML IJ SOSY
PREFILLED_SYRINGE | INTRAMUSCULAR | Status: AC
  Filled 2020-08-03: qty 2

## 2020-08-03 MED ORDER — DEXTROSE 50 % IV SOLN
INTRAVENOUS | Status: AC
  Administered 2020-08-03: 50 mL
  Filled 2020-08-03: qty 50

## 2020-08-03 MED ORDER — GLYCOPYRROLATE 0.2 MG/ML IJ SOLN
INTRAMUSCULAR | Status: DC | PRN
  Administered 2020-08-03: .4 mg via INTRAVENOUS

## 2020-08-03 MED ORDER — ROCURONIUM BROMIDE 10 MG/ML (PF) SYRINGE
PREFILLED_SYRINGE | INTRAVENOUS | Status: AC
  Filled 2020-08-03: qty 10

## 2020-08-03 MED ORDER — PROPOFOL 10 MG/ML IV BOLUS
INTRAVENOUS | Status: DC | PRN
  Administered 2020-08-03: 70 mg via INTRAVENOUS

## 2020-08-03 MED ORDER — FENTANYL CITRATE (PF) 100 MCG/2ML IJ SOLN
INTRAMUSCULAR | Status: DC | PRN
  Administered 2020-08-03 (×2): 25 ug via INTRAVENOUS
  Administered 2020-08-03: 50 ug via INTRAVENOUS

## 2020-08-03 SURGICAL SUPPLY — 38 items
BAG RETRIEVAL 10 (BASKET) ×1
CHLORAPREP W/TINT 26 (MISCELLANEOUS) ×2 IMPLANT
CLOTH BEACON ORANGE TIMEOUT ST (SAFETY) ×2 IMPLANT
COVER LIGHT HANDLE STERIS (MISCELLANEOUS) ×4 IMPLANT
CUTTER FLEX LINEAR 45M (STAPLE) ×2 IMPLANT
DRSG TEGADERM 2-3/8X2-3/4 SM (GAUZE/BANDAGES/DRESSINGS) ×2 IMPLANT
ELECT REM PT RETURN 9FT ADLT (ELECTROSURGICAL) ×2
ELECTRODE REM PT RTRN 9FT ADLT (ELECTROSURGICAL) ×1 IMPLANT
GLOVE SURG SS PI 7.5 STRL IVOR (GLOVE) ×2 IMPLANT
GLOVE SURG UNDER POLY LF SZ7 (GLOVE) ×6 IMPLANT
GOWN STRL REUS W/TWL LRG LVL3 (GOWN DISPOSABLE) ×4 IMPLANT
INST SET LAPROSCOPIC AP (KITS) ×2 IMPLANT
IV NS IRRIG 3000ML ARTHROMATIC (IV SOLUTION) ×6 IMPLANT
KIT TURNOVER KIT A (KITS) ×2 IMPLANT
MANIFOLD NEPTUNE II (INSTRUMENTS) ×2 IMPLANT
NEEDLE INSUFFLATION 14GA 120MM (NEEDLE) ×2 IMPLANT
NS IRRIG 1000ML POUR BTL (IV SOLUTION) ×2 IMPLANT
PACK LAP CHOLE LZT030E (CUSTOM PROCEDURE TRAY) ×2 IMPLANT
PAD ARMBOARD 7.5X6 YLW CONV (MISCELLANEOUS) ×2 IMPLANT
PENCIL SMOKE EVACUATOR COATED (MISCELLANEOUS) ×2 IMPLANT
RELOAD 45 VASCULAR/THIN (ENDOMECHANICALS) ×2 IMPLANT
SET BASIN LINEN APH (SET/KITS/TRAYS/PACK) ×2 IMPLANT
SET TUBE IRRIG SUCTION NO TIP (IRRIGATION / IRRIGATOR) ×2 IMPLANT
SET TUBE SMOKE EVAC HIGH FLOW (TUBING) ×2 IMPLANT
SHEARS HARMONIC ACE PLUS 36CM (ENDOMECHANICALS) ×2 IMPLANT
SPONGE GAUZE 2X2 8PLY STRL LF (GAUZE/BANDAGES/DRESSINGS) ×2 IMPLANT
SUT MNCRL AB 4-0 PS2 18 (SUTURE) ×4 IMPLANT
SUT VICRYL 0 UR6 27IN ABS (SUTURE) ×2 IMPLANT
SYR 20ML LL LF (SYRINGE) ×4 IMPLANT
SYS BAG RETRIEVAL 10MM (BASKET) ×1
SYSTEM BAG RETRIEVAL 10MM (BASKET) ×1 IMPLANT
TRAY FOLEY W/BAG SLVR 16FR (SET/KITS/TRAYS/PACK) ×1
TRAY FOLEY W/BAG SLVR 16FR ST (SET/KITS/TRAYS/PACK) ×1 IMPLANT
TROCAR ENDO BLADELESS 11MM (ENDOMECHANICALS) ×2 IMPLANT
TROCAR ENDO BLADELESS 12MM (ENDOMECHANICALS) ×2 IMPLANT
TROCAR XCEL NON-BLD 5MMX100MML (ENDOMECHANICALS) ×2 IMPLANT
TUBE CONNECTING 12X1/4 (SUCTIONS) ×2 IMPLANT
WARMER LAPAROSCOPE (MISCELLANEOUS) ×2 IMPLANT

## 2020-08-03 NOTE — Anesthesia Procedure Notes (Signed)
Procedure Name: Intubation Date/Time: 08/03/2020 1:47 PM Performed by: Tacy Learn, CRNA Pre-anesthesia Checklist: Patient identified, Emergency Drugs available, Suction available, Patient being monitored and Timeout performed Patient Re-evaluated:Patient Re-evaluated prior to induction Oxygen Delivery Method: Circle system utilized Preoxygenation: Pre-oxygenation with 100% oxygen Induction Type: IV induction Laryngoscope Size: 2 Grade View: Grade II Tube type: Oral Tube size: 7.0 mm Number of attempts: 1 Airway Equipment and Method: Stylet Placement Confirmation: ETT inserted through vocal cords under direct vision, positive ETCO2, CO2 detector and breath sounds checked- equal and bilateral Secured at: 20 cm Tube secured with: Tape Dental Injury: Teeth and Oropharynx as per pre-operative assessment

## 2020-08-03 NOTE — Progress Notes (Signed)
Subjective: Patient states she significantly worse, now with an NGT.  Her abdominal pain is across the lower portion of her abdomen and more diffuse with distension.  She denies any bowel movement or flatus.  She has had non-billious emesis. .  Objective: Vital signs in last 24 hours: Temp:  [98 F (36.7 C)-98.6 F (37 C)] 98 F (36.7 C) (07/08 0449) Pulse Rate:  [59-63] 59 (07/08 0449) Resp:  [17-18] 18 (07/08 0449) BP: (156-173)/(44-50) 156/45 (07/08 0449) SpO2:  [96 %-98 %] 96 % (07/08 0449) Last BM Date: 07/30/20  Intake/Output from previous day: 07/07 0701 - 07/08 0700 In: 1651.7 [I.V.:862.3; IV Piggyback:789.4] Out: -  Intake/Output this shift: No intake/output data recorded.  General appearance: alert, cooperative, and no distress GI: Distended with involuntary guarding most in the RLQ.  She is tender to palpation diffusely.  Lab Results:  Recent Labs    08/02/20 0440 08/03/20 0358  WBC 9.1 10.0  HGB 9.2* 8.9*  HCT 27.7* 26.7*  PLT 151 157    BMET Recent Labs    08/02/20 0440 08/03/20 0358  NA 128* 132*  K 3.4* 3.8  CL 96* 102  CO2 19* 19*  GLUCOSE 74 70  BUN 66* 63*  CREATININE 3.36* 2.79*  CALCIUM 8.4* 8.3*    PT/INR No results for input(s): LABPROT, INR in the last 72 hours.  Studies/Results: CT ABDOMEN PELVIS WO CONTRAST  Result Date: 08/01/2020 CLINICAL DATA:  Abdominal pain, constipation EXAM: CT ABDOMEN AND PELVIS WITHOUT CONTRAST TECHNIQUE: Multidetector CT imaging of the abdomen and pelvis was performed following the standard protocol without IV contrast. COMPARISON:  CTA abdomen/pelvis dated 04/16/2019 FINDINGS: Lower chest: Small right pleural effusion. Hepatobiliary: Unenhanced liver is grossly unremarkable. Layering gallstone (series 2/image 29), without associated inflammatory changes. No intrahepatic or extrahepatic ductal dilatation. Pancreas: Within normal limits. Spleen: Within normal limits. Adrenals/Urinary Tract: Adrenal glands  are within normal limits. Kidneys are within normal limits. No renal, ureteral, or bladder calculi. No hydronephrosis. Bladder is within normal limits. Stomach/Bowel: Stomach is notable for a small hiatal hernia. Multiple dilated loops of small bowel in the left mid abdomen. Given the additional findings, this favors reactive small bowel ileus over small bowel obstruction. Abnormal blind-ending tubular structure in the right lower quadrant (series 2/image 83) is poorly visualized/partially obscured by fluid but favors an abnormal appendix, suggesting acute appendicitis. This is distinct from the terminal ileum (series 2/image 56). Sigmoid diverticulosis, without evidence of diverticulitis. Vascular/Lymphatic: No evidence of abdominal aortic aneurysm. Atherosclerotic calcifications of the abdominal aorta and branch vessels. No suspicious abdominopelvic lymphadenopathy. Reproductive: Uterine fibroids. No adnexal masses. Other: Small to moderate abdominopelvic ascites, including fluid in the right lower quadrant which partially obscures the suspected appendix. No free air to suggest macroscopic perforation. Musculoskeletal: Degenerative changes of the visualized thoracolumbar spine. IMPRESSION: Although poorly visualized, there is a suspected abnormal appendix in the right lower quadrant, suggesting acute appendicitis. No free air to suggest macroscopic perforation. Multiple dilated loops of small bowel in the left mid abdomen. In this clinical setting, this appearance favors reactive small bowel ileus over small bowel obstruction. Sigmoid diverticulosis, without evidence of diverticulitis. Small to moderate abdominopelvic ascites, including fluid in the right lower quadrant. Electronically Signed   By: Julian Hy M.D.   On: 08/01/2020 19:06   DG Chest Port 1 View  Result Date: 08/03/2020 CLINICAL DATA:  NG tube placement EXAM: PORTABLE CHEST 1 VIEW COMPARISON:  02/28/2020 FINDINGS: Enteric tube identified  coursing below the diaphragm with distal  tip and side port projecting within the expected location of the gastric body. Stable cardiomegaly. Atherosclerotic calcification of the aortic knob. No appreciable pleural fluid collection. No focal airspace consolidation, or pneumothorax. IMPRESSION: Enteric tube appropriately positioned within the gastric body. Electronically Signed   By: Davina Poke D.O.   On: 08/03/2020 11:07    Anti-infectives: Anti-infectives (From admission, onward)    Start     Dose/Rate Route Frequency Ordered Stop   08/04/20 1000  ciprofloxacin (CIPRO) IVPB 400 mg        400 mg 200 mL/hr over 60 Minutes Intravenous Every 24 hours 08/03/20 1044     08/02/20 1100  ciprofloxacin (CIPRO) IVPB 400 mg  Status:  Discontinued        400 mg 200 mL/hr over 60 Minutes Intravenous Every 12 hours 08/01/20 2219 08/03/20 1044   08/02/20 0600  metroNIDAZOLE (FLAGYL) IVPB 500 mg        500 mg 100 mL/hr over 60 Minutes Intravenous Every 8 hours 08/01/20 2219     08/01/20 2145  ciprofloxacin (CIPRO) IVPB 400 mg        400 mg 200 mL/hr over 60 Minutes Intravenous  Once 08/01/20 2139 08/01/20 2337   08/01/20 2145  metroNIDAZOLE (FLAGYL) IVPB 500 mg        500 mg 100 mL/hr over 60 Minutes Intravenous  Once 08/01/20 2139 08/01/20 2324       Assessment/Plan: Impression:  Clinical deterioration despite 2 days of Abx.    Her white blood cell count continues to be normal.  Her renal function has not recovered significantly.  Urinalysis negative.  Will continue IV antibiotics for now.   Continue to hold Eliquis.   Doubt repeat imaging would be helpful.    Suspect acute appendicitis, will proceed with diagnostic laparoscopy and likely appendectomy.   The risks, benefits, complications, treatment options, and expected outcomes were discussed with the patient. The treatment of antibiotics alone was discussed giving a 20% chance that this could fail and surgery would be necessary.   The  possibilities of  bleeding, recurrent infection, perforation of viscus, finding a normal appendix, the need for additional procedures, failure to diagnose a condition, conversion to open procedure and creating a complication requiring transfusion or further operations were discussed. The patient was given the opportunity to ask questions and have them answered.  Patient would like to proceed with Laparoscopic Appendectomy and consent was obtained.    LOS: 1 day    Ronny Bacon 08/03/2020

## 2020-08-03 NOTE — Progress Notes (Signed)
Patient blood sugar 59. Dextrose 50% solution given.

## 2020-08-03 NOTE — Transfer of Care (Signed)
Immediate Anesthesia Transfer of Care Note  Patient: Crystal [name]  Procedure(s) Performed: APPENDECTOMY LAPAROSCOPIC (Abdomen)  Patient Location: PACU  Anesthesia Type:General  Level of Consciousness: awake, alert , patient cooperative and responds to stimulation  Airway & Oxygen Therapy: Patient Spontanous Breathing and Patient connected to face mask oxygen  Post-op Assessment: Report given to RN, Post -op Vital signs reviewed and stable and Patient moving all extremities X 4  Post vital signs: Reviewed and stable  Last Vitals:  Vitals Value Taken Time  BP    Temp    Pulse 84 08/03/20 1520  Resp    SpO2 92 % 08/03/20 1520  Vitals shown include unvalidated device data.  Last Pain:  Vitals:   08/03/20 1307  TempSrc: Oral  PainSc: 10-Worst pain ever      Patients Stated Pain Goal: 6 (14/10/30 1314)  Complications: No notable events documented.

## 2020-08-03 NOTE — Progress Notes (Signed)
46 F NG tube place with low intermitted suction. Verbal order to put in 1 view chest xray for placement. Notified GI MD.

## 2020-08-03 NOTE — Progress Notes (Signed)
PROGRESS NOTE    Crystal [name]   OAC:[id number]  DOB: 09/22/1934  DOA: 08/01/2020 PCP: Glenda Chroman, MD   Brief Narrative:  Crystal [name] 86 y.o. female with medical history significant of iron deficiency anemia, Bell's palsy, history of breast cancer and right mastectomy, CAD, history of STEMI, chronic systolic heart failure, paroxysmal atrial fibrillation, stage IV chronic kidney disease, essential hypertension, GERD, gout, history of squamous cell carcinoma of the skin, mixed hyperlipidemia, osteopenia, thyroid nodule, type 2 diabetes mellitus who is coming to the emergency department due to abdominal pain, bloating with frequent dry heaving for the past 3 days.  He saw her primary care physician and a KUB revealed dilated bowel loops.  She presented to the ED due to increasing abdominal distention pain and dry heaving.  She had not had any vomiting.  CT abdomen pelvis: Although poorly visualized, there is suspected abnormal appendix in the right lower quadrant suggesting acute appendicitis.  Multiple dilated loops of small bowel in the left abdomen, sigmoid diverticulosis without diverticulitis, small to moderate abdominal pelvic ascites including fluid in the right lower quadrant. General surgery consulted and patient started on IV antibiotics.  Subjective: Abdominal pain is even worse than yesterday today.  No bowel movement.  No vomiting.    Assessment & Plan:   Principal Problem:   Ileus ([place]) in setting of possible appendicitis and hyponatremia and hypokalemia -Continue to keep n.p.o. except for necessary medications -It is not clear but she may have an appendicitis-General surgery would like to continue IV antibiotics (Cipro and Flagyl) and will follow - Continue normal saline, replace electrolytes aggressively and hold Demadex (takes 40 mg daily) - Holding Eliquis for possible surgery - last taken on 7/5 -Continue as needed IV morphine, IV Protonix, as needed IV  Zofran -General surgery has ordered a UA due to suprapubic pain and a twice daily Dulcolax suppository -7/8>> I saw the patient this morning, ordered an NG tube and an x-ray was later seen by surgery and taken to the OR for lap appendectomy-found to have a gangrenous appendix with peritonitis  Active Problems:  Hyponatremia-acute kidney injury superimposed on chronic kidney disease stage IV Grade 2 d CHF - Baseline creatinine is 2.0-2.4 - Presented with a creatinine of 3.57 and a sodium of 127 - May be prerenal -holding Demadex  - continue to hydrate with IVF as she is NPO after surgery    CAD (coronary artery disease) with DES to mid LAD 1/21 Hypertension, chronic diastolic heart failure grade 2 -Hold Crestor, hydralazine, amlodipine-resumed carvedilol and Imdur-follow BP -Plavix stopped by her cardiologist on 06/07/2020 due to significant amount of bruising and the fact that she was already on Eliquis -Last echo in 06/05/2020 revealed grade 2 diastolic dysfunction, mildly elevated pulmonary artery systolic pressure, mild to moderate mitral regurgitation, moderate tricuspid regurgitation  Hypokalemia - Replaced- cont 20 KCL in IVF  Paroxysmal atrial fibrillation - Holding Eliquis as mentioned - Follow on telemetry -Resume amiodarone and carvedilol  Glucose intolerance - Last A1c 5.8-not on any medications at home according to her med list -CBGs ordered   Hyperlipidemia -Hold statin for now     Iron deficiency anemia -Hold iron tablets  History of breast cancer status post right-sided mastectomy    Time spent in minutes: 40 DVT prophylaxis: SCD's Start: 08/03/20 1703 Place and maintain sequential compression device Start: 08/02/20 0800  Code Status: Full code Family Communication:  Level of Care: Level of care: Telemetry Disposition Plan:  Status is:  Inpatient  Remains inpatient appropriate because:IV treatments appropriate due to intensity of illness or inability  to take PO  Dispo: The patient is from: Home              Anticipated d/c is to: Home              Patient currently is not medically stable to d/c.   Difficult to place patient No      Consultants:  General surgery Procedures:  None Antimicrobials:  Anti-infectives (From admission, onward)    Start     Dose/Rate Route Frequency Ordered Stop   08/04/20 1000  ciprofloxacin (CIPRO) IVPB 400 mg        400 mg 200 mL/hr over 60 Minutes Intravenous Every 24 hours 08/03/20 1044     08/02/20 1100  ciprofloxacin (CIPRO) IVPB 400 mg  Status:  Discontinued        400 mg 200 mL/hr over 60 Minutes Intravenous Every 12 hours 08/01/20 2219 08/03/20 1044   08/02/20 0600  metroNIDAZOLE (FLAGYL) IVPB 500 mg        500 mg 100 mL/hr over 60 Minutes Intravenous Every 8 hours 08/01/20 2219     08/01/20 2145  ciprofloxacin (CIPRO) IVPB 400 mg        400 mg 200 mL/hr over 60 Minutes Intravenous  Once 08/01/20 2139 08/01/20 2337   08/01/20 2145  metroNIDAZOLE (FLAGYL) IVPB 500 mg        500 mg 100 mL/hr over 60 Minutes Intravenous  Once 08/01/20 2139 08/01/20 2324        Objective: Vitals:   08/03/20 1545 08/03/20 1600 08/03/20 1615 08/03/20 1639  BP: (!) 177/75 (!) 161/71 (!) 155/69 (!) 152/68  Pulse: 67 64 62 61  Resp: (!) 23 18 20 15   Temp:    97.8 F (36.6 C)  TempSrc:      SpO2: 92% 93% 93% 94%  Weight:      Height:        Intake/Output Summary (Last 24 hours) at 08/03/2020 1805 Last data filed at 08/03/2020 1758 Gross per 24 hour  Intake 3647.91 ml  Output --  Net 3647.91 ml    Filed Weights   08/01/20 1546  Weight: 56.2 kg    Examination: General exam: Appears uncomfortable  HEENT: PERRLA, oral mucosa moist, no sclera icterus or thrush Respiratory system: Clear to auscultation. Respiratory effort normal. Cardiovascular system: S1 & S2 heard, regular rate and rhythm Gastrointestinal system: Abdomen soft, severely distended and tender to touch. No Bowel sounds Central  nervous system: Alert and oriented. No focal neurological deficits. Extremities: No cyanosis, clubbing or edema Skin: No rashes or ulcers Psychiatry:  Mood & affect appropriate.      Data Reviewed: I have personally reviewed following labs and imaging studies  CBC: Recent Labs  Lab 08/01/20 1508 08/02/20 0440 08/03/20 0358  WBC 8.5 9.1 10.0  NEUTROABS 7.7  --   --   HGB 11.1* 9.2* 8.9*  HCT 33.6* 27.7* 26.7*  MCV 85.7 84.2 84.0  PLT 170 151 701    Basic Metabolic Panel: Recent Labs  Lab 08/01/20 1508 08/02/20 0440 08/03/20 0358  NA 127* 128* 132*  K 4.6 3.4* 3.8  CL 94* 96* 102  CO2 21* 19* 19*  GLUCOSE 106* 74 70  BUN 69* 66* 63*  CREATININE 3.57* 3.36* 2.79*  CALCIUM 9.0 8.4* 8.3*  MG  --  1.7  --     GFR: Estimated Creatinine Clearance: 13.2 mL/min (A) (  by C-G formula based on SCr of 2.79 mg/dL (H)). Liver Function Tests: Recent Labs  Lab 08/01/20 1508 08/02/20 0440  AST 50* 30  ALT 43 30  ALKPHOS 66 49  BILITOT 1.2 1.1  PROT 6.7 5.4*  ALBUMIN 3.6 2.8*    Recent Labs  Lab 08/01/20 1508  LIPASE 21    No results for input(s): AMMONIA in the last 168 hours. Coagulation Profile: No results for input(s): INR, PROTIME in the last 168 hours. Cardiac Enzymes: No results for input(s): CKTOTAL, CKMB, CKMBINDEX, TROPONINI in the last 168 hours. BNP (last 3 results) No results for input(s): PROBNP in the last 8760 hours. HbA1C: Recent Labs    08/02/20 0440  HGBA1C 5.8*    CBG: Recent Labs  Lab 08/03/20 0546 08/03/20 0724 08/03/20 1103 08/03/20 1537 08/03/20 1635  GLUCAP 163* 119* 151* 132* 129*   Lipid Profile: No results for input(s): CHOL, HDL, LDLCALC, TRIG, CHOLHDL, LDLDIRECT in the last 72 hours. Thyroid Function Tests: No results for input(s): TSH, T4TOTAL, FREET4, T3FREE, THYROIDAB in the last 72 hours. Anemia Panel: No results for input(s): VITAMINB12, FOLATE, FERRITIN, TIBC, IRON, RETICCTPCT in the last 72 hours. Urine  analysis:    Component Value Date/Time   COLORURINE YELLOW 08/02/2020 0947   APPEARANCEUR CLEAR 08/02/2020 0947   LABSPEC 1.009 08/02/2020 0947   PHURINE 5.0 08/02/2020 0947   GLUCOSEU NEGATIVE 08/02/2020 0947   HGBUR SMALL (A) 08/02/2020 0947   BILIRUBINUR NEGATIVE 08/02/2020 0947   KETONESUR NEGATIVE 08/02/2020 0947   PROTEINUR 100 (A) 08/02/2020 0947   NITRITE NEGATIVE 08/02/2020 0947   LEUKOCYTESUR NEGATIVE 08/02/2020 0947   Sepsis Labs: @LABRCNTIP (procalcitonin:4,lacticidven:4) ) Recent Results (from the past 240 hour(s))  Resp Panel by RT-PCR (Flu A&B, Covid) Nasopharyngeal Swab     Status: None   Collection Time: 08/01/20  8:03 PM   Specimen: Nasopharyngeal Swab; Nasopharyngeal(NP) swabs in vial transport medium  Result Value Ref Range Status   SARS Coronavirus 2 by RT PCR NEGATIVE NEGATIVE Final    Comment: (NOTE) SARS-CoV-2 target nucleic acids are NOT DETECTED.  The SARS-CoV-2 RNA is generally detectable in upper respiratory specimens during the acute phase of infection. The lowest concentration of SARS-CoV-2 viral copies this assay can detect is 138 copies/mL. A negative result does not preclude SARS-Cov-2 infection and should not be used as the sole basis for treatment or other patient management decisions. A negative result may occur with  improper specimen collection/handling, submission of specimen other than nasopharyngeal swab, presence of viral mutation(s) within the areas targeted by this assay, and inadequate number of viral copies(<138 copies/mL). A negative result must be combined with clinical observations, patient history, and epidemiological information. The expected result is Negative.  Fact Sheet for Patients:  EntrepreneurPulse.com.au  Fact Sheet for Healthcare Providers:  IncredibleEmployment.be  This test is no t yet approved or cleared by the [place] FDA and  has been authorized for detection and/or  diagnosis of SARS-CoV-2 by FDA under an Emergency Use Authorization (EUA). This EUA will remain  in effect (meaning this test can be used) for the duration of the COVID-19 declaration under Section 564(b)(1) of the Act, 21 U.S.C.section 360bbb-3(b)(1), unless the authorization is terminated  or revoked sooner.       Influenza A by PCR NEGATIVE NEGATIVE Final   Influenza B by PCR NEGATIVE NEGATIVE Final    Comment: (NOTE) The Xpert Xpress SARS-CoV-2/FLU/RSV plus assay is intended as an aid in the diagnosis of influenza from Nasopharyngeal swab specimens and  should not be used as a sole basis for treatment. Nasal washings and aspirates are unacceptable for Xpert Xpress SARS-CoV-2/FLU/RSV testing.  Fact Sheet for Patients: EntrepreneurPulse.com.au  Fact Sheet for Healthcare Providers: IncredibleEmployment.be  This test is not yet approved or cleared by the [place] FDA and has been authorized for detection and/or diagnosis of SARS-CoV-2 by FDA under an Emergency Use Authorization (EUA). This EUA will remain in effect (meaning this test can be used) for the duration of the COVID-19 declaration under Section 564(b)(1) of the Act, 21 U.S.C. section 360bbb-3(b)(1), unless the authorization is terminated or revoked.  Performed at [facility], [address], [place], [place] [address]          Radiology Studies: CT ABDOMEN PELVIS WO CONTRAST  Result Date: 08/01/2020 CLINICAL DATA:  Abdominal pain, constipation EXAM: CT ABDOMEN AND PELVIS WITHOUT CONTRAST TECHNIQUE: Multidetector CT imaging of the abdomen and pelvis was performed following the standard protocol without IV contrast. COMPARISON:  CTA abdomen/pelvis dated 04/16/2019 FINDINGS: Lower chest: Small right pleural effusion. Hepatobiliary: Unenhanced liver is grossly unremarkable. Layering gallstone (series 2/image 29), without associated inflammatory changes. No intrahepatic or  extrahepatic ductal dilatation. Pancreas: Within normal limits. Spleen: Within normal limits. Adrenals/Urinary Tract: Adrenal glands are within normal limits. Kidneys are within normal limits. No renal, ureteral, or bladder calculi. No hydronephrosis. Bladder is within normal limits. Stomach/Bowel: Stomach is notable for a small hiatal hernia. Multiple dilated loops of small bowel in the left mid abdomen. Given the additional findings, this favors reactive small bowel ileus over small bowel obstruction. Abnormal blind-ending tubular structure in the right lower quadrant (series 2/image 83) is poorly visualized/partially obscured by fluid but favors an abnormal appendix, suggesting acute appendicitis. This is distinct from the terminal ileum (series 2/image 56). Sigmoid diverticulosis, without evidence of diverticulitis. Vascular/Lymphatic: No evidence of abdominal aortic aneurysm. Atherosclerotic calcifications of the abdominal aorta and branch vessels. No suspicious abdominopelvic lymphadenopathy. Reproductive: Uterine fibroids. No adnexal masses. Other: Small to moderate abdominopelvic ascites, including fluid in the right lower quadrant which partially obscures the suspected appendix. No free air to suggest macroscopic perforation. Musculoskeletal: Degenerative changes of the visualized thoracolumbar spine. IMPRESSION: Although poorly visualized, there is a suspected abnormal appendix in the right lower quadrant, suggesting acute appendicitis. No free air to suggest macroscopic perforation. Multiple dilated loops of small bowel in the left mid abdomen. In this clinical setting, this appearance favors reactive small bowel ileus over small bowel obstruction. Sigmoid diverticulosis, without evidence of diverticulitis. Small to moderate abdominopelvic ascites, including fluid in the right lower quadrant. Electronically Signed   By: Julian Hy M.D.   On: 08/01/2020 19:06   DG Chest Port 1 View  Result Date:  08/03/2020 CLINICAL DATA:  NG tube placement EXAM: PORTABLE CHEST 1 VIEW COMPARISON:  02/28/2020 FINDINGS: Enteric tube identified coursing below the diaphragm with distal tip and side port projecting within the expected location of the gastric body. Stable cardiomegaly. Atherosclerotic calcification of the aortic knob. No appreciable pleural fluid collection. No focal airspace consolidation, or pneumothorax. IMPRESSION: Enteric tube appropriately positioned within the gastric body. Electronically Signed   By: Davina Poke D.O.   On: 08/03/2020 11:07      Scheduled Meds:  amiodarone  200 mg Oral Daily   bisacodyl  10 mg Rectal BID   metoprolol tartrate  5 mg Intravenous Q6H   pantoprazole (PROTONIX) IV  40 mg Intravenous Q24H   Continuous Infusions:  [START ON 08/04/2020] ciprofloxacin     dextrose 5 %  and 0.9 % NaCl with KCl 20 mEq/L 75 mL/hr at 08/03/20 1650   metronidazole 100 mL/hr at 08/03/20 1758     LOS: 1 day      Debbe Odea, MD [facility] Pager: [email or web address] 08/03/2020, 6:05 PM

## 2020-08-03 NOTE — Anesthesia Preprocedure Evaluation (Signed)
Anesthesia Evaluation  Patient identified by MRN, date of birth, ID band Patient awake    Reviewed: Allergy & Precautions, NPO status , Patient's Chart, lab work & pertinent test results, reviewed documented beta blocker date and time   Airway Mallampati: III  TM Distance: >3 FB Neck ROM: Full    Dental  (+) Caps, Dental Advisory Given   Pulmonary shortness of breath and with exertion,    breath sounds clear to auscultation + decreased breath sounds      Cardiovascular Exercise Tolerance: Poor hypertension, Pt. on medications and Pt. on home beta blockers + CAD, + Past MI, + Cardiac Stents, + Peripheral Vascular Disease and +CHF  Normal cardiovascular exam+ dysrhythmias Atrial Fibrillation  Rhythm:Regular Rate:Normal  1. Left ventricular ejection fraction, by estimation, is 65 to 70%. The left ventricle has normal function. The left ventricle has no regional wall motion abnormalities. There is mild left ventricular hypertrophy. Left ventricular diastolic parameters are consistent with Grade II diastolic dysfunction (pseudonormalization).  2. Right ventricular systolic function is normal. The right ventricular size is normal. There is mildly elevated pulmonary artery systolic  pressure. The estimated right ventricular systolic pressure is 76.5 mmHg.  3. Left atrial size was mildly dilated.  4. Right atrial size was moderately dilated.  5. There is a trivial pericardial effusion posterior to the left  ventricle.  6. The mitral valve is abnormal. Mild to moderate mitral valve regurgitation.  7. Tricuspid valve regurgitation is moderate.  8. The aortic valve is tricuspid. Aortic valve regurgitation is trivial.  Aortic regurgitation PHT measures 956 msec.  9. The inferior vena cava is normal in size with <50% respiratory variability, suggesting right atrial pressure of 8 mmHg.    Neuro/Psych  Neuromuscular disease negative psych  ROS   GI/Hepatic Neg liver ROS, GERD  Medicated,Ileus, acute appendicitis    Endo/Other  diabetes, Well Controlled, Type 2, Oral Hypoglycemic Agents  Renal/GU Renal InsufficiencyRenal disease (AKI)     Musculoskeletal negative musculoskeletal ROS (+)   Abdominal   Peds  Hematology  (+) anemia ,   Anesthesia Other Findings Right mastectomy   Reproductive/Obstetrics negative OB ROS                           Anesthesia Physical Anesthesia Plan  ASA: 4 and emergent  Anesthesia Plan: General   Post-op Pain Management:    Induction: Intravenous, Rapid sequence and Cricoid pressure planned  PONV Risk Score and Plan: 4 or greater and Ondansetron and Dexamethasone  Airway Management Planned: Oral ETT  Additional Equipment:   Intra-op Plan:   Post-operative Plan: Extubation in OR and Possible Post-op intubation/ventilation  Informed Consent: I have reviewed the patients History and Physical, chart, labs and discussed the procedure including the risks, benefits and alternatives for the proposed anesthesia with the patient or authorized representative who has indicated his/her understanding and acceptance.     Dental advisory given  Plan Discussed with: CRNA and Surgeon  Anesthesia Plan Comments:        Anesthesia Quick Evaluation

## 2020-08-03 NOTE — Progress Notes (Signed)
[facility] night shift telemetry coverage note.  The patient was hypoglycemic with a CBG of 59 mg/dL.  She was given 12.5 g of dextrose IV by the nursing staff.  I have changed the fluids from NS plus KCl 20 mEq at 75 mL an hour to D5 NS plus KCl 20 mEq at 75 mL an hour.  We will continue to monitor her CBG closely.  Tennis Must, MD.

## 2020-08-03 NOTE — Anesthesia Postprocedure Evaluation (Signed)
Anesthesia Post Note  Patient: Crystal [name]  Procedure(s) Performed: APPENDECTOMY LAPAROSCOPIC (Abdomen)  Patient location during evaluation: PACU Anesthesia Type: General Level of consciousness: awake and alert and oriented Pain management: pain level controlled Vital Signs Assessment: post-procedure vital signs reviewed and stable Respiratory status: spontaneous breathing and respiratory function stable Cardiovascular status: blood pressure returned to baseline and stable Postop Assessment: no apparent nausea or vomiting Anesthetic complications: no   No notable events documented.   Last Vitals:  Vitals:   08/03/20 1600 08/03/20 1615  BP: (!) 161/71 (!) 155/69  Pulse: 64 62  Resp: 18 20  Temp:    SpO2: 93% 93%    Last Pain:  Vitals:   08/03/20 1615  TempSrc:   PainSc: 7                  Yousif Edelson C [name]

## 2020-08-03 NOTE — Op Note (Signed)
Laparascopic appendectomy   Crystal [name] Date of operation:  08/03/2020  Indications: The patient presented with a history of  abdominal pain. Workup has revealed findings consistent with acute appendicitis.  Pre-operative Diagnosis: Acute appendicitis with generalized peritonitis  Post-operative Diagnosis: Same  Surgeon: Ronny Bacon, M.D., FACS  Anesthesia: General with endotracheal tube  Findings: Extensive/disrupted exudative biofilm surrounding adjacent structures and appendix.  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  Copious green-gray peritoneal fluid.  Multiple interloop small bowel exudative biofilm adhesions without any additional evidence of abscess formation.  Estimated Blood Loss: 25         Specimens: appendix         Complications: None  Procedure Details  The patient was seen again in the preop area. The options of surgery versus observation were reviewed with the patient and/or family. The risks of bleeding, infection, recurrence of symptoms, negative laparoscopy, potential for an open procedure, bowel injury, abscess or infection, were all reviewed as well. The patient was taken to Operating Room, identified as Crystal [name] and the procedure verified as laparoscopic appendectomy. A Time Out was held and the above information confirmed. The patient was placed in the supine position and general anesthesia was induced.  Antibiotic prophylaxis was administered pre-op and VTE prophylaxis was in place.   The abdomen was prepped and draped in a sterile fashion.  A timeout is completed.  After local infiltration of quarter percent Marcaine with epinephrine, stab incision was made left upper quadrant.  Just below the costal margin approximately midclavicular line the Veress needle is passed with sensation of the layers to penetrate the abdominal wall and into the peritoneum.  Saline drop test is confirmed peritoneal placement.  Insufflation is initiated  with carbon dioxide to pressures of 15 mmHg.  Local infiltration with 0.25% Marcaine with epi is administered to all incisions.  A left lower quadrant incision was made.  A 12 mm optical trocar was passed into the peritoneal cavity under direct visualization.  Pneumoperitoneum maintained. One 11 mm port in the left suprapubic area, and another 5 mm port was placed in the epigastrium under direct visualization.  Extensive interloop adhesions noted with the exudative fibrin as mortar.  Blunt dissection in the right lower quadrant identified extensive biofilm with loss of containment of peritoneal fluid.  Murky green-gray peritoneal fluid was identified in the pelvis and both upper quadrants. A lot of the biofilm was removed as able.  I suspect 90% of it was successfully removed from the peritoneal cavity. The appendix was identified, significant gangrene, difficult to identify perforation site.  The junction with the cecum was clearly viable.  The appendix was carefully dissected. The mesoappendix was divided with Harmonic scalpel. The base of the appendix was dissected out and divided with a 45 mm white load Endo GIA.The appendix was placed in a Endo Catch bag and removed via the 12 mm LLQ port. The right lower quadrant, pelvis and both upper quadrants were then irrigated with a total of 9 L of normal saline which was aspirated.  Mechanical agitation was done to encourage adequate irrigation and cleansing of the abdominal cavity.  Inspection  failed to identify any additional bleeding and there were no signs of bowel injury. Again the right lower quadrant was inspected there was no sign of bleeding or bowel injury.  With an intact staple line of the cecal cuff. Pneumoperitoneum was released, all ports were removed.  The LLQ fascia was closed with 0 Vicryl, and  the supra pubic fascia closed in like manner.  and the skin incisions were approximated with subcuticular 4-0 Monocryl.  The patient tolerated the  procedure well, there were no complications. The sponge lap and needle count were correct at the end of the procedure.  The patient was taken to the recovery room in stable condition, we will maintain the nasogastric tube to low wall suction anticipating some persistence of her ileus.  Ronny Bacon, M.D., [facility] 08/03/2020 - 3:27 PM

## 2020-08-04 LAB — CBC
HCT: 27.8 % — ABNORMAL LOW (ref 36.0–46.0)
Hemoglobin: 9.2 g/dL — ABNORMAL LOW (ref 12.0–15.0)
MCH: 27.8 pg (ref 26.0–34.0)
MCHC: 33.1 g/dL (ref 30.0–36.0)
MCV: 84 fL (ref 80.0–100.0)
Platelets: 173 10*3/uL (ref 150–400)
RBC: 3.31 MIL/uL — ABNORMAL LOW (ref 3.87–5.11)
RDW: 17.1 % — ABNORMAL HIGH (ref 11.5–15.5)
WBC: 8.5 10*3/uL (ref 4.0–10.5)
nRBC: 0 % (ref 0.0–0.2)

## 2020-08-04 LAB — BASIC METABOLIC PANEL
Anion gap: 6 (ref 5–15)
BUN: 56 mg/dL — ABNORMAL HIGH (ref 8–23)
CO2: 20 mmol/L — ABNORMAL LOW (ref 22–32)
Calcium: 8.4 mg/dL — ABNORMAL LOW (ref 8.9–10.3)
Chloride: 109 mmol/L (ref 98–111)
Creatinine, Ser: 2.34 mg/dL — ABNORMAL HIGH (ref 0.44–1.00)
GFR, Estimated: 20 mL/min — ABNORMAL LOW (ref >60)
Glucose, Bld: 169 mg/dL — ABNORMAL HIGH (ref 70–99)
Potassium: 4 mmol/L (ref 3.5–5.1)
Sodium: 135 mmol/L (ref 135–145)

## 2020-08-04 LAB — GLUCOSE, CAPILLARY
Glucose-Capillary: 105 mg/dL — ABNORMAL HIGH (ref 70–99)
Glucose-Capillary: 137 mg/dL — ABNORMAL HIGH (ref 70–99)
Glucose-Capillary: 138 mg/dL — ABNORMAL HIGH (ref 70–99)
Glucose-Capillary: 141 mg/dL — ABNORMAL HIGH (ref 70–99)
Glucose-Capillary: 160 mg/dL — ABNORMAL HIGH (ref 70–99)

## 2020-08-04 NOTE — Progress Notes (Signed)
[place] Hospital Day(s): 2.   Post op day(s): 1 Day Post-Op.   Interval History: Patient seen and examined, no acute events or new complaints overnight. Patient reports diminishing abd distention, denies n/v, and flatus.  Much improved over yesterday.   Vital signs in last 24 hours: [min-max] current  Temp:  [97.7 F (36.5 C)-98.8 F (37.1 C)] 98.7 F (37.1 C) (07/09 0430) Pulse Rate:  [59-76] 62 (07/09 0430) Resp:  [15-23] 21 (07/09 0430) BP: (148-202)/(45-88) 167/70 (07/09 0430) SpO2:  [92 %-99 %] 96 % (07/09 0430)     Height: 5\' 4"  (162.6 cm) Weight: 56.2 kg BMI (Calculated): 21.27   Intake/Output last 2 shifts:  07/08 0701 - 07/09 0700 In: 1996.2 [I.V.:1796.2; IV Piggyback:200] Out: 200 [Urine:200]   Physical Exam:  Constitutional: alert, cooperative and no distress  Respiratory: breathing non-labored at rest  Cardiovascular: regular rate and sinus rhythm  Gastrointestinal: soft, non-tender, but distended as yesterday Integumentary: dressings intact.   Labs:  CBC Latest Ref Rng & Units 08/04/2020 08/03/2020 08/02/2020  WBC 4.0 - 10.5 K/uL 8.5 10.0 9.1  Hemoglobin 12.0 - 15.0 g/dL 9.2(L) 8.9(L) 9.2(L)  Hematocrit 36.0 - 46.0 % 27.8(L) 26.7(L) 27.7(L)  Platelets 150 - 400 K/uL 173 157 151   CMP Latest Ref Rng & Units 08/04/2020 08/03/2020 08/02/2020  Glucose 70 - 99 mg/dL 169(H) 70 74  BUN 8 - 23 mg/dL 56(H) 63(H) 66(H)  Creatinine 0.44 - 1.00 mg/dL 2.34(H) 2.79(H) 3.36(H)  Sodium 135 - 145 mmol/L 135 132(L) 128(L)  Potassium 3.5 - 5.1 mmol/L 4.0 3.8 3.4(L)  Chloride 98 - 111 mmol/L 109 102 96(L)  CO2 22 - 32 mmol/L 20(L) 19(L) 19(L)  Calcium 8.9 - 10.3 mg/dL 8.4(L) 8.3(L) 8.4(L)  Total Protein 6.5 - 8.1 g/dL - - 5.4(L)  Total Bilirubin 0.3 - 1.2 mg/dL - - 1.1  Alkaline Phos 38 - 126 U/L - - 49  AST 15 - 41 U/L - - 30  ALT 0 - 44 U/L - - 30     Imaging studies: No new pertinent imaging studies   Assessment/Plan:  86 y.o.  female with  1 Day Post-Op s/p laparoscopic appendectomy for gangrenous appendicitis with peritonitis, complicated by pertinent comorbidities including:  Patient Active Problem List   Diagnosis Date Noted   Hypokalemia    Hyponatremia 08/01/2020   Ileus ([place])    Melena 07/12/2020   Iron deficiency anemia 07/12/2020   Constipation 07/12/2020   Pleural effusion    Acute kidney injury ([place])    Shortness of breath    Flash pulmonary edema ([facility])    Hypertensive urgency    AF (paroxysmal atrial fibrillation) ([facility])    Statin myopathy 02/02/2020   Nonspecific abnormal electrocardiogram (ECG) (EKG)    Dysphagia 09/21/2019   Anemia 09/21/2019   Diarrhea 07/06/2019   Acute on chronic combined systolic and diastolic CHF (congestive heart failure) ([place]) 02/28/2019   Acute respiratory failure with hypoxemia ([place]) 02/28/2019   Acute pulmonary edema ([facility])    Hyperlipidemia LDL goal <70    Acute renal failure superimposed on chronic kidney disease ([facility])    CHF exacerbation ([place]) 02/27/2019   CAD (coronary artery disease) 02/27/2019   STEMI (ST elevation myocardial infarction) ([place]) 01/30/2019   NSTEMI (non-ST elevated myocardial infarction) ([place]) 01/30/2019   Non-ST elevation (NSTEMI) myocardial infarction [facility])    Renal artery stenosis ([place]) 12/02/2017   Type 2 diabetes mellitus ([place]) 03/04/2011   Hyperlipidemia 03/04/2011   Resistant hypertension  03/04/2011    - Clamping trial of NGT, possible removal later today if tolerated.    - Continue NPO for now, may have sips with meds   - OOB as tolerated.    - Continue IV abx, fluids and monitor I/Os.      -- Ronny Bacon, M.D., [facility] 08/04/2020

## 2020-08-04 NOTE — Progress Notes (Addendum)
PROGRESS NOTE    Crystal [name]   UTM:[id number]  DOB: January 17, 1935  DOA: 08/01/2020 PCP: Glenda Chroman, MD   Brief Narrative:  Crystal [name] 86 y.o. female with medical history significant of iron deficiency anemia, Bell's palsy, history of breast cancer and right mastectomy, CAD, history of STEMI, chronic systolic heart failure, paroxysmal atrial fibrillation, stage IV chronic kidney disease, essential hypertension, GERD, gout, history of squamous cell carcinoma of the skin, mixed hyperlipidemia, osteopenia, thyroid nodule, type 2 diabetes mellitus who is coming to the emergency department due to abdominal pain, bloating with frequent dry heaving for the past 3 days.  He saw her primary care physician and a KUB revealed dilated bowel loops.  She presented to the ED due to increasing abdominal distention pain and dry heaving.  She had not had any vomiting.  CT abdomen pelvis: Although poorly visualized, there is suspected abnormal appendix in the right lower quadrant suggesting acute appendicitis.  Multiple dilated loops of small bowel in the left abdomen, sigmoid diverticulosis without diverticulitis, small to moderate abdominal pelvic ascites including fluid in the right lower quadrant. General surgery consulted and patient started on IV antibiotics.  Subjective: No abdominal pain today.  Abdomen is a little sore.  No nausea or vomiting.  No bowel movement.  She has some burning in her chest.    Assessment & Plan:   Principal Problem:   Ileus ([place]) in setting of possible appendicitis and hyponatremia and hypokalemia -Continue to keep n.p.o. except for necessary medications -It is not clear but she may have an appendicitis-General surgery would like to continue IV antibiotics (Cipro and Flagyl) and will follow - Continue normal saline, replace electrolytes aggressively and hold Demadex (takes 40 mg daily) - Holding Eliquis for possible surgery - last taken on 7/5 -Continue as  needed IV morphine, IV Protonix, as needed IV Zofran -General surgery has ordered a UA due to suprapubic pain and a twice daily Dulcolax suppository -7/8>> I saw the patient this morning, ordered an NG tube and an x-ray was later seen by surgery and taken to the OR for lap appendectomy-found to have a gangrenous ruptured appendix with peritonitis - 7/9> admitted this morning.  NG tube in place.  She appears to be progressing well today  Active Problems:  Hyponatremia Acute kidney injury superimposed on chronic kidney disease stage IV Grade 2 d CHF - Baseline creatinine is 2.0-2.4 - Presented with a creatinine of 3.57 and a sodium of 127 - May be prerenal -holding Demadex  - continue to hydrate with IVF as she remains n.p.o. after surgery-urine output not documented properly however patient and nurse that he that she is voiding well but pure wick is now functioning properly - Continue to follow closely for fluid overload-I have her on a continuous pulse ox - Pulse ox is in the mid 90s today, BUN 56/creatinine 2.34    CAD (coronary artery disease) with DES to mid LAD 1/21 Hypertension, chronic diastolic heart failure grade 2 Paroxysmal atrial fibrillation -Hold Crestor, hydralazine, amlodipine-resumed carvedilol and Imdur-follow BP -Metoprolol IV being given in place of carvedilol-as needed IV hydralazine also ordered -Plavix stopped by her cardiologist on 06/07/2020 due to significant amount of bruising and the fact that she was already on Eliquis -Last echo in 06/05/2020 revealed grade 2 diastolic dysfunction, mildly elevated pulmonary artery systolic pressure, mild to moderate mitral regurgitation, moderate tricuspid regurgitation -Holding Eliquis after surgery  Hypokalemia - Replaced- cont 20 KCL in IVF  Glucose intolerance -  Last A1c 5.8-not on any medications at home according to her med list -CBGs ordered   Hyperlipidemia -Hold statin for now     Iron deficiency anemia -Hold  iron tablets  History of breast cancer status post right-sided mastectomy    Time spent in minutes: 45 DVT prophylaxis: SCD's Start: 08/03/20 1703 Place and maintain sequential compression device Start: 08/02/20 0800  Code Status: Full code Family Communication:  Level of Care: Level of care: Telemetry Disposition Plan:  Status is: Inpatient  Remains inpatient appropriate because:IV treatments appropriate due to intensity of illness or inability to take PO  Dispo: The patient is from: Home              Anticipated d/c is to: Home              Patient currently is not medically stable to d/c.   Difficult to place patient No      Consultants:  General surgery Procedures:  None Antimicrobials:  Anti-infectives (From admission, onward)    Start     Dose/Rate Route Frequency Ordered Stop   08/04/20 1000  ciprofloxacin (CIPRO) IVPB 400 mg        400 mg 200 mL/hr over 60 Minutes Intravenous Every 24 hours 08/03/20 1044     08/02/20 1100  ciprofloxacin (CIPRO) IVPB 400 mg  Status:  Discontinued        400 mg 200 mL/hr over 60 Minutes Intravenous Every 12 hours 08/01/20 2219 08/03/20 1044   08/02/20 0600  metroNIDAZOLE (FLAGYL) IVPB 500 mg        500 mg 100 mL/hr over 60 Minutes Intravenous Every 8 hours 08/01/20 2219     08/01/20 2145  ciprofloxacin (CIPRO) IVPB 400 mg        400 mg 200 mL/hr over 60 Minutes Intravenous  Once 08/01/20 2139 08/01/20 2337   08/01/20 2145  metroNIDAZOLE (FLAGYL) IVPB 500 mg        500 mg 100 mL/hr over 60 Minutes Intravenous  Once 08/01/20 2139 08/01/20 2324        Objective: Vitals:   08/03/20 2347 08/03/20 2351 08/04/20 0430 08/04/20 1308  BP: (!) 148/47  (!) 167/70 (!) 177/67  Pulse: (!) 59 60 62 62  Resp:  20 (!) 21 16  Temp:  98.8 F (37.1 C) 98.7 F (37.1 C) 98.9 F (37.2 C)  TempSrc:  Oral Oral Oral  SpO2:  97% 96% 95%  Weight:      Height:        Intake/Output Summary (Last 24 hours) at 08/04/2020 1601 Last data  filed at 08/04/2020 1251 Gross per 24 hour  Intake 896.23 ml  Output 200 ml  Net 696.23 ml    Filed Weights   08/01/20 1546  Weight: 56.2 kg    Examination: General exam: Appears comfortable  HEENT: PERRLA, oral mucosa moist, no sclera icterus or thrush Respiratory system: Clear to auscultation. Respiratory effort normal. Cardiovascular system: S1 & S2 heard, regular rate and rhythm Gastrointestinal system: Abdomen soft, nondistended. NG tube present-  poor bowel sounds Central nervous system: Alert and oriented. No focal neurological deficits. Extremities: No cyanosis, clubbing or edema Skin: No rashes or ulcers Psychiatry:  Mood & affect appropriate.      Data Reviewed: I have personally reviewed following labs and imaging studies  CBC: Recent Labs  Lab 08/01/20 1508 08/02/20 0440 08/03/20 0358 08/04/20 0432  WBC 8.5 9.1 10.0 8.5  NEUTROABS 7.7  --   --   --  HGB 11.1* 9.2* 8.9* 9.2*  HCT 33.6* 27.7* 26.7* 27.8*  MCV 85.7 84.2 84.0 84.0  PLT 170 151 157 149    Basic Metabolic Panel: Recent Labs  Lab 08/01/20 1508 08/02/20 0440 08/03/20 0358 08/04/20 0432  NA 127* 128* 132* 135  K 4.6 3.4* 3.8 4.0  CL 94* 96* 102 109  CO2 21* 19* 19* 20*  GLUCOSE 106* 74 70 169*  BUN 69* 66* 63* 56*  CREATININE 3.57* 3.36* 2.79* 2.34*  CALCIUM 9.0 8.4* 8.3* 8.4*  MG  --  1.7  --   --     GFR: Estimated Creatinine Clearance: 15.7 mL/min (A) (by C-G formula based on SCr of 2.34 mg/dL (H)). Liver Function Tests: Recent Labs  Lab 08/01/20 1508 08/02/20 0440  AST 50* 30  ALT 43 30  ALKPHOS 66 49  BILITOT 1.2 1.1  PROT 6.7 5.4*  ALBUMIN 3.6 2.8*    Recent Labs  Lab 08/01/20 1508  LIPASE 21    No results for input(s): AMMONIA in the last 168 hours. Coagulation Profile: No results for input(s): INR, PROTIME in the last 168 hours. Cardiac Enzymes: No results for input(s): CKTOTAL, CKMB, CKMBINDEX, TROPONINI in the last 168 hours. BNP (last 3 results) No  results for input(s): PROBNP in the last 8760 hours. HbA1C: Recent Labs    08/02/20 0440  HGBA1C 5.8*    CBG: Recent Labs  Lab 08/03/20 1635 08/03/20 1937 08/03/20 2355 08/04/20 0742 08/04/20 1128  GLUCAP 129* 172* 172* 141* 160*    Lipid Profile: No results for input(s): CHOL, HDL, LDLCALC, TRIG, CHOLHDL, LDLDIRECT in the last 72 hours. Thyroid Function Tests: No results for input(s): TSH, T4TOTAL, FREET4, T3FREE, THYROIDAB in the last 72 hours. Anemia Panel: No results for input(s): VITAMINB12, FOLATE, FERRITIN, TIBC, IRON, RETICCTPCT in the last 72 hours. Urine analysis:    Component Value Date/Time   COLORURINE YELLOW 08/02/2020 0947   APPEARANCEUR CLEAR 08/02/2020 0947   LABSPEC 1.009 08/02/2020 0947   PHURINE 5.0 08/02/2020 0947   GLUCOSEU NEGATIVE 08/02/2020 0947   HGBUR SMALL (A) 08/02/2020 0947   BILIRUBINUR NEGATIVE 08/02/2020 0947   KETONESUR NEGATIVE 08/02/2020 0947   PROTEINUR 100 (A) 08/02/2020 0947   NITRITE NEGATIVE 08/02/2020 0947   LEUKOCYTESUR NEGATIVE 08/02/2020 0947   Sepsis Labs: @LABRCNTIP (procalcitonin:4,lacticidven:4) ) Recent Results (from the past 240 hour(s))  Resp Panel by RT-PCR (Flu A&B, Covid) Nasopharyngeal Swab     Status: None   Collection Time: 08/01/20  8:03 PM   Specimen: Nasopharyngeal Swab; Nasopharyngeal(NP) swabs in vial transport medium  Result Value Ref Range Status   SARS Coronavirus 2 by RT PCR NEGATIVE NEGATIVE Final    Comment: (NOTE) SARS-CoV-2 target nucleic acids are NOT DETECTED.  The SARS-CoV-2 RNA is generally detectable in upper respiratory specimens during the acute phase of infection. The lowest concentration of SARS-CoV-2 viral copies this assay can detect is 138 copies/mL. A negative result does not preclude SARS-Cov-2 infection and should not be used as the sole basis for treatment or other patient management decisions. A negative result may occur with  improper specimen collection/handling,  submission of specimen other than nasopharyngeal swab, presence of viral mutation(s) within the areas targeted by this assay, and inadequate number of viral copies(<138 copies/mL). A negative result must be combined with clinical observations, patient history, and epidemiological information. The expected result is Negative.  Fact Sheet for Patients:  EntrepreneurPulse.com.au  Fact Sheet for Healthcare Providers:  IncredibleEmployment.be  This test is no t yet approved  or cleared by the [place] and  has been authorized for detection and/or diagnosis of SARS-CoV-2 by FDA under an Emergency Use Authorization (EUA). This EUA will remain  in effect (meaning this test can be used) for the duration of the COVID-19 declaration under Section 564(b)(1) of the Act, 21 U.S.C.section 360bbb-3(b)(1), unless the authorization is terminated  or revoked sooner.       Influenza A by PCR NEGATIVE NEGATIVE Final   Influenza B by PCR NEGATIVE NEGATIVE Final    Comment: (NOTE) The Xpert Xpress SARS-CoV-2/FLU/RSV plus assay is intended as an aid in the diagnosis of influenza from Nasopharyngeal swab specimens and should not be used as a sole basis for treatment. Nasal washings and aspirates are unacceptable for Xpert Xpress SARS-CoV-2/FLU/RSV testing.  Fact Sheet for Patients: EntrepreneurPulse.com.au  Fact Sheet for Healthcare Providers: IncredibleEmployment.be  This test is not yet approved or cleared by the [place] FDA and has been authorized for detection and/or diagnosis of SARS-CoV-2 by FDA under an Emergency Use Authorization (EUA). This EUA will remain in effect (meaning this test can be used) for the duration of the COVID-19 declaration under Section 564(b)(1) of the Act, 21 U.S.C. section 360bbb-3(b)(1), unless the authorization is terminated or revoked.  Performed at [facility], [address] [place], [place] [address]   Urine Culture     Status: Abnormal (Preliminary result)   Collection Time: 08/02/20  9:47 AM   Specimen: Urine, Clean Catch  Result Value Ref Range Status   Specimen Description   Final    URINE, CLEAN CATCH Performed at [facility], [address], [place], [place] [address]    Special Requests   Final    Normal Performed at [facility], [address], [place], [place] [address]    Culture 90,000 COLONIES/mL ESCHERICHIA COLI (A)  Final   Report Status PENDING  Incomplete         Radiology Studies: DG Chest Port 1 View  Result Date: 08/03/2020 CLINICAL DATA:  NG tube placement EXAM: PORTABLE CHEST 1 VIEW COMPARISON:  02/28/2020 FINDINGS: Enteric tube identified coursing below the diaphragm with distal tip and side port projecting within the expected location of the gastric body. Stable cardiomegaly. Atherosclerotic calcification of the aortic knob. No appreciable pleural fluid collection. No focal airspace consolidation, or pneumothorax. IMPRESSION: Enteric tube appropriately positioned within the gastric body. Electronically Signed   By: Davina Poke D.O.   On: 08/03/2020 11:07      Scheduled Meds:  amiodarone  200 mg Oral Daily   bisacodyl  10 mg Rectal BID   metoprolol tartrate  5 mg Intravenous Q6H   pantoprazole (PROTONIX) IV  40 mg Intravenous Q24H   Continuous Infusions:  ciprofloxacin 400 mg (08/04/20 1038)   dextrose 5 % and 0.9 % NaCl with KCl 20 mEq/L 75 mL/hr at 08/03/20 1650   metronidazole 500 mg (08/04/20 1545)     LOS: 2 days      Debbe Odea, MD [facility] Pager: [email or web address] 08/04/2020, 4:01 PM

## 2020-08-05 LAB — URINE CULTURE
Culture: 90000 — AB
Special Requests: NORMAL

## 2020-08-05 LAB — CBC
HCT: 30.1 % — ABNORMAL LOW (ref 36.0–46.0)
Hemoglobin: 9.6 g/dL — ABNORMAL LOW (ref 12.0–15.0)
MCH: 27.9 pg (ref 26.0–34.0)
MCHC: 31.9 g/dL (ref 30.0–36.0)
MCV: 87.5 fL (ref 80.0–100.0)
Platelets: 218 10*3/uL (ref 150–400)
RBC: 3.44 MIL/uL — ABNORMAL LOW (ref 3.87–5.11)
RDW: 17.7 % — ABNORMAL HIGH (ref 11.5–15.5)
WBC: 13.6 10*3/uL — ABNORMAL HIGH (ref 4.0–10.5)
nRBC: 0 % (ref 0.0–0.2)

## 2020-08-05 LAB — GLUCOSE, CAPILLARY
Glucose-Capillary: 122 mg/dL — ABNORMAL HIGH (ref 70–99)
Glucose-Capillary: 115 mg/dL — ABNORMAL HIGH (ref 70–99)
Glucose-Capillary: 143 mg/dL — ABNORMAL HIGH (ref 70–99)
Glucose-Capillary: 106 mg/dL — ABNORMAL HIGH (ref 70–99)
Glucose-Capillary: 137 mg/dL — ABNORMAL HIGH (ref 70–99)

## 2020-08-05 LAB — BASIC METABOLIC PANEL
Anion gap: 5 (ref 5–15)
BUN: 53 mg/dL — ABNORMAL HIGH (ref 8–23)
CO2: 20 mmol/L — ABNORMAL LOW (ref 22–32)
Calcium: 8.6 mg/dL — ABNORMAL LOW (ref 8.9–10.3)
Chloride: 111 mmol/L (ref 98–111)
Creatinine, Ser: 2.13 mg/dL — ABNORMAL HIGH (ref 0.44–1.00)
GFR, Estimated: 23 mL/min — ABNORMAL LOW (ref >60)
Glucose, Bld: 123 mg/dL — ABNORMAL HIGH (ref 70–99)
Potassium: 3.9 mmol/L (ref 3.5–5.1)
Sodium: 136 mmol/L (ref 135–145)

## 2020-08-05 MED ORDER — AMLODIPINE BESYLATE 5 MG PO TABS
2.5000 mg | ORAL_TABLET | Freq: Every day | ORAL | Status: DC
  Administered 2020-08-05 – 2020-08-08 (×4): 2.5 mg via ORAL
  Filled 2020-08-05 (×5): qty 1

## 2020-08-05 MED ORDER — CARVEDILOL 12.5 MG PO TABS
25.0000 mg | ORAL_TABLET | Freq: Two times a day (BID) | ORAL | Status: DC
  Administered 2020-08-05 – 2020-08-10 (×10): 25 mg via ORAL
  Filled 2020-08-05 (×11): qty 2

## 2020-08-05 MED ORDER — HYDRALAZINE HCL 25 MG PO TABS
100.0000 mg | ORAL_TABLET | Freq: Three times a day (TID) | ORAL | Status: DC
  Administered 2020-08-05 – 2020-08-10 (×15): 100 mg via ORAL
  Filled 2020-08-05 (×17): qty 4

## 2020-08-05 MED ORDER — LACTATED RINGERS IV SOLN: INTRAVENOUS | Status: DC

## 2020-08-05 MED ORDER — ISOSORBIDE MONONITRATE ER 60 MG PO TB24
120.0000 mg | ORAL_TABLET | Freq: Every day | ORAL | Status: DC
  Administered 2020-08-05 – 2020-08-09 (×5): 120 mg via ORAL
  Filled 2020-08-05 (×5): qty 2

## 2020-08-05 NOTE — Progress Notes (Signed)
[place] Hospital Day(s): 3.   Post op day(s): 2 Days Post-Op.   Interval History: Patient seen and examined, no acute events or new complaints overnight. Patient has no c/o abd distention, denies n/v following removal of NGT, and denies flatus.  Much improved from yesterday.   Vital signs in last 24 hours: [min-max] current  Temp:  [97.7 F (36.5 C)-98.9 F (37.2 C)] 98.1 F (36.7 C) (07/10 0835) Pulse Rate:  [62-66] 66 (07/10 0835) Resp:  [16-19] 17 (07/10 0835) BP: (177-196)/(54-67) 188/56 (07/10 0835) SpO2:  [92 %-95 %] 92 % (07/10 0519)     Height: 5\' 4"  (162.6 cm) Weight: 56.2 kg BMI (Calculated): 21.27   Intake/Output last 2 shifts:  07/09 0701 - 07/10 0700 In: 221.8 [I.V.:121.8; IV Piggyback:100] Out: 700 [Urine:700]   Physical Exam:  Constitutional: alert, cooperative and no distress  Respiratory: breathing non-labored at rest  Cardiovascular: regular rate and sinus rhythm  Gastrointestinal: soft, non-tender, but distended still. Integumentary: dressings intact.   Labs:  CBC Latest Ref Rng & Units 08/05/2020 08/04/2020 08/03/2020  WBC 4.0 - 10.5 K/uL 13.6(H) 8.5 10.0  Hemoglobin 12.0 - 15.0 g/dL 9.6(L) 9.2(L) 8.9(L)  Hematocrit 36.0 - 46.0 % 30.1(L) 27.8(L) 26.7(L)  Platelets 150 - 400 K/uL 218 173 157   CMP Latest Ref Rng & Units 08/05/2020 08/04/2020 08/03/2020  Glucose 70 - 99 mg/dL 123(H) 169(H) 70  BUN 8 - 23 mg/dL 53(H) 56(H) 63(H)  Creatinine 0.44 - 1.00 mg/dL 2.13(H) 2.34(H) 2.79(H)  Sodium 135 - 145 mmol/L 136 135 132(L)  Potassium 3.5 - 5.1 mmol/L 3.9 4.0 3.8  Chloride 98 - 111 mmol/L 111 109 102  CO2 22 - 32 mmol/L 20(L) 20(L) 19(L)  Calcium 8.9 - 10.3 mg/dL 8.6(L) 8.4(L) 8.3(L)  Total Protein 6.5 - 8.1 g/dL - - -  Total Bilirubin 0.3 - 1.2 mg/dL - - -  Alkaline Phos 38 - 126 U/L - - -  AST 15 - 41 U/L - - -  ALT 0 - 44 U/L - - -     Imaging studies: No new pertinent imaging studies   Assessment/Plan:  86  y.o. female with  2 Days Post-Op s/p laparoscopic appendectomy for gangrenous appendicitis with peritonitis, complicated by pertinent comorbidities including:  Patient Active Problem List   Diagnosis Date Noted   Hypokalemia    Hyponatremia 08/01/2020   Ileus ([place])    Melena 07/12/2020   Iron deficiency anemia 07/12/2020   Constipation 07/12/2020   Pleural effusion    Acute kidney injury ([place])    Shortness of breath    Flash pulmonary edema (H[facility])    Hypertensive urgency    AF (paroxysmal atrial fibrillation) (HCC)    Statin myopathy 02/02/2020   Nonspecific abnormal electrocardiogram (ECG) (EKG)    Dysphagia 09/21/2019   Anemia 09/21/2019   Diarrhea 07/06/2019   Acute on chronic combined systolic and diastolic CHF (congestive heart failure) ([place]) 02/28/2019   Acute respiratory failure with hypoxemia ([place]) 02/28/2019   Acute pulmonary edema (HCC)    Hyperlipidemia LDL goal <70    Acute renal failure superimposed on chronic kidney disease (HCC)    CHF exacerbation ([place]) 02/27/2019   CAD (coronary artery disease) 02/27/2019   STEMI (ST elevation myocardial infarction) ([place]) 01/30/2019   NSTEMI (non-ST elevated myocardial infarction) ([place]) 01/30/2019   Non-ST elevation (NSTEMI) myocardial infarction [facility])    Renal artery stenosis ([place]) 12/02/2017   Type 2 diabetes mellitus ([place]) 03/04/2011   Hyperlipidemia  03/04/2011   Resistant hypertension 03/04/2011    - Sips CLD and hold at CLD until flatus returns.    - OOB/ambulate as tolerated.    - Continue IV abx, adjust IV fluids as PO is tolerated and monitor I/Os.      -- Ronny Bacon, M.D., [facility] 08/05/2020

## 2020-08-05 NOTE — Progress Notes (Signed)
Pt tolerated removing NG well.

## 2020-08-05 NOTE — Progress Notes (Signed)
PROGRESS NOTE    Crystal [name]   AST:[id number]  DOB: 04-27-1934  DOA: 08/01/2020 PCP: Glenda Chroman, MD   Brief Narrative:  Crystal [name] 86 y.o. female with medical history significant of iron deficiency anemia, Bell's palsy, history of breast cancer and right mastectomy, CAD, history of STEMI, chronic systolic heart failure, paroxysmal atrial fibrillation, stage IV chronic kidney disease, essential hypertension, GERD, gout, history of squamous cell carcinoma of the skin, mixed hyperlipidemia, osteopenia, thyroid nodule, type 2 diabetes mellitus who is coming to the emergency department due to abdominal pain, bloating with frequent dry heaving for the past 3 days.  He saw her primary care physician and a KUB revealed dilated bowel loops.  She presented to the ED due to increasing abdominal distention pain and dry heaving.  She had not had any vomiting.  CT abdomen pelvis: Although poorly visualized, there is suspected abnormal appendix in the right lower quadrant suggesting acute appendicitis.  Multiple dilated loops of small bowel in the left abdomen, sigmoid diverticulosis without diverticulitis, small to moderate abdominal pelvic ascites including fluid in the right lower quadrant. General surgery consulted and patient started on IV antibiotics.  Subjective: She feels movement of her bowels but has not passed gas or had a BM. No nausea or vomiting.     Assessment & Plan:   Principal Problem:   Ileus ([place]) in setting of possible appendicitis and hyponatremia and hypokalemia -Continue to keep n.p.o. except for necessary medications -It is not clear but she may have an appendicitis-General surgery would like to continue IV antibiotics (Cipro and Flagyl) and will follow - Continue normal saline, replace electrolytes aggressively and hold Demadex (takes 40 mg daily) - Holding Eliquis for possible surgery - last taken on 7/5 -Continue as needed IV morphine, IV Protonix, as needed  IV Zofran -General surgery has ordered a UA due to suprapubic pain and a twice daily Dulcolax suppository -7/8>> I saw the patient this morning, ordered an NG tube and an x-ray was later seen by surgery and taken to the OR for lap appendectomy-found to have a gangrenous ruptured appendix with peritonitis -  NG removed on 7/9- on clears today  Active Problems:  Hyponatremia Acute kidney injury superimposed on chronic kidney disease stage IV Grade 2 d CHF - Baseline creatinine is 2.0-2.4 - Presented with a creatinine of 3.57 and a sodium of 127 - May be prerenal -holding Demadex  -  Cr improving- stop IVF today - maintain continuous pulse ox    CAD (coronary artery disease) with DES to mid LAD 1/21 Hypertension, chronic diastolic heart failure grade 2 Paroxysmal atrial fibrillation -Hold Crestor, hydralazine, amlodipine-resumed carvedilol and Imdur-follow BP -Metoprolol IV being given in place of carvedilol-as needed IV hydralazine also ordered -Plavix stopped by her cardiologist on 06/07/2020 due to significant amount of bruising and the fact that she was already on Eliquis -Last echo in 06/05/2020 revealed grade 2 diastolic dysfunction, mildly elevated pulmonary artery systolic pressure, mild to moderate mitral regurgitation, moderate tricuspid regurgitation -Holding Eliquis after surgery - BP 188/56 today> resume Norvasc, Coreg, Hydralazine and hold IV Metoprolol- use PRN Hydralazine for SBP > 180  Hypokalemia - Replaced-   Glucose intolerance - Last A1c 5.8-not on any medications at home according to her med list -CBGs ordered   Hyperlipidemia -Hold statin for now    Iron deficiency anemia -Hold iron tablets  History of breast cancer status post right-sided mastectomy    Time spent in minutes: 45 DVT prophylaxis:  SCD's Start: 08/03/20 1703 Place and maintain sequential compression device Start: 08/02/20 0800  Code Status: Full code Family Communication:  Level of  Care: Level of care: Telemetry Disposition Plan:  Status is: Inpatient  Remains inpatient appropriate because:IV treatments appropriate due to intensity of illness or inability to take PO  Dispo: The patient is from: Home              Anticipated d/c is to: Home              Patient currently is not medically stable to d/c.   Difficult to place patient No      Consultants:  General surgery Procedures:  None Antimicrobials:  Anti-infectives (From admission, onward)    Start     Dose/Rate Route Frequency Ordered Stop   08/04/20 1000  ciprofloxacin (CIPRO) IVPB 400 mg        400 mg 200 mL/hr over 60 Minutes Intravenous Every 24 hours 08/03/20 1044     08/02/20 1100  ciprofloxacin (CIPRO) IVPB 400 mg  Status:  Discontinued        400 mg 200 mL/hr over 60 Minutes Intravenous Every 12 hours 08/01/20 2219 08/03/20 1044   08/02/20 0600  metroNIDAZOLE (FLAGYL) IVPB 500 mg        500 mg 100 mL/hr over 60 Minutes Intravenous Every 8 hours 08/01/20 2219     08/01/20 2145  ciprofloxacin (CIPRO) IVPB 400 mg        400 mg 200 mL/hr over 60 Minutes Intravenous  Once 08/01/20 2139 08/01/20 2337   08/01/20 2145  metroNIDAZOLE (FLAGYL) IVPB 500 mg        500 mg 100 mL/hr over 60 Minutes Intravenous  Once 08/01/20 2139 08/01/20 2324        Objective: Vitals:   08/05/20 0519 08/05/20 0835 08/05/20 1156 08/05/20 1343  BP: (!) 196/65 (!) 188/56 (!) 178/51 (!) 168/57  Pulse: 66 66 62 60  Resp:  17  16  Temp: 97.7 F (36.5 C) 98.1 F (36.7 C)  98.4 F (36.9 C)  TempSrc: Oral Oral    SpO2: 92%   94%  Weight:      Height:        Intake/Output Summary (Last 24 hours) at 08/05/2020 1738 Last data filed at 08/05/2020 1300 Gross per 24 hour  Intake 701.75 ml  Output 600 ml  Net 101.75 ml    Filed Weights   08/01/20 1546  Weight: 56.2 kg    Examination: General exam: Appears comfortable  HEENT: PERRLA, oral mucosa moist, no sclera icterus or thrush Respiratory system: Clear  to auscultation. Respiratory effort normal. Cardiovascular system: S1 & S2 heard, regular rate and rhythm Gastrointestinal system: Abdomen soft, non-tender, moderately distended. Normal bowel sounds   Central nervous system: Alert and oriented. No focal neurological deficits. Extremities: No cyanosis, clubbing or edema Skin: No rashes or ulcers Psychiatry:  Mood & affect appropriate.      Data Reviewed: I have personally reviewed following labs and imaging studies  CBC: Recent Labs  Lab 08/01/20 1508 08/02/20 0440 08/03/20 0358 08/04/20 0432 08/05/20 0552  WBC 8.5 9.1 10.0 8.5 13.6*  NEUTROABS 7.7  --   --   --   --   HGB 11.1* 9.2* 8.9* 9.2* 9.6*  HCT 33.6* 27.7* 26.7* 27.8* 30.1*  MCV 85.7 84.2 84.0 84.0 87.5  PLT 170 151 157 173 193    Basic Metabolic Panel: Recent Labs  Lab 08/01/20 1508 08/02/20 0440 08/03/20 0358 08/04/20  7341 08/05/20 0552  NA 127* 128* 132* 135 136  K 4.6 3.4* 3.8 4.0 3.9  CL 94* 96* 102 109 111  CO2 21* 19* 19* 20* 20*  GLUCOSE 106* 74 70 169* 123*  BUN 69* 66* 63* 56* 53*  CREATININE 3.57* 3.36* 2.79* 2.34* 2.13*  CALCIUM 9.0 8.4* 8.3* 8.4* 8.6*  MG  --  1.7  --   --   --     GFR: Estimated Creatinine Clearance: 17.3 mL/min (A) (by C-G formula based on SCr of 2.13 mg/dL (H)). Liver Function Tests: Recent Labs  Lab 08/01/20 1508 08/02/20 0440  AST 50* 30  ALT 43 30  ALKPHOS 66 49  BILITOT 1.2 1.1  PROT 6.7 5.4*  ALBUMIN 3.6 2.8*    Recent Labs  Lab 08/01/20 1508  LIPASE 21    No results for input(s): AMMONIA in the last 168 hours. Coagulation Profile: No results for input(s): INR, PROTIME in the last 168 hours. Cardiac Enzymes: No results for input(s): CKTOTAL, CKMB, CKMBINDEX, TROPONINI in the last 168 hours. BNP (last 3 results) No results for input(s): PROBNP in the last 8760 hours. HbA1C: No results for input(s): HGBA1C in the last 72 hours.  CBG: Recent Labs  Lab 08/04/20 2332 08/05/20 0405 08/05/20 0719  08/05/20 1120 08/05/20 1609  GLUCAP 105* 122* 115* 143* 106*    Lipid Profile: No results for input(s): CHOL, HDL, LDLCALC, TRIG, CHOLHDL, LDLDIRECT in the last 72 hours. Thyroid Function Tests: No results for input(s): TSH, T4TOTAL, FREET4, T3FREE, THYROIDAB in the last 72 hours. Anemia Panel: No results for input(s): VITAMINB12, FOLATE, FERRITIN, TIBC, IRON, RETICCTPCT in the last 72 hours. Urine analysis:    Component Value Date/Time   COLORURINE YELLOW 08/02/2020 0947   APPEARANCEUR CLEAR 08/02/2020 0947   LABSPEC 1.009 08/02/2020 0947   PHURINE 5.0 08/02/2020 0947   GLUCOSEU NEGATIVE 08/02/2020 0947   HGBUR SMALL (A) 08/02/2020 0947   BILIRUBINUR NEGATIVE 08/02/2020 0947   KETONESUR NEGATIVE 08/02/2020 0947   PROTEINUR 100 (A) 08/02/2020 0947   NITRITE NEGATIVE 08/02/2020 0947   LEUKOCYTESUR NEGATIVE 08/02/2020 0947   Sepsis Labs: @LABRCNTIP (procalcitonin:4,lacticidven:4) ) Recent Results (from the past 240 hour(s))  Resp Panel by RT-PCR (Flu A&B, Covid) Nasopharyngeal Swab     Status: None   Collection Time: 08/01/20  8:03 PM   Specimen: Nasopharyngeal Swab; Nasopharyngeal(NP) swabs in vial transport medium  Result Value Ref Range Status   SARS Coronavirus 2 by RT PCR NEGATIVE NEGATIVE Final    Comment: (NOTE) SARS-CoV-2 target nucleic acids are NOT DETECTED.  The SARS-CoV-2 RNA is generally detectable in upper respiratory specimens during the acute phase of infection. The lowest concentration of SARS-CoV-2 viral copies this assay can detect is 138 copies/mL. A negative result does not preclude SARS-Cov-2 infection and should not be used as the sole basis for treatment or other patient management decisions. A negative result may occur with  improper specimen collection/handling, submission of specimen other than nasopharyngeal swab, presence of viral mutation(s) within the areas targeted by this assay, and inadequate number of viral copies(<138 copies/mL). A  negative result must be combined with clinical observations, patient history, and epidemiological information. The expected result is Negative.  Fact Sheet for Patients:  EntrepreneurPulse.com.au  Fact Sheet for Healthcare Providers:  IncredibleEmployment.be  This test is no t yet approved or cleared by the [place] FDA and  has been authorized for detection and/or diagnosis of SARS-CoV-2 by FDA under an Emergency Use Authorization (EUA). This EUA will  remain  in effect (meaning this test can be used) for the duration of the COVID-19 declaration under Section 564(b)(1) of the Act, 21 U.S.C.section 360bbb-3(b)(1), unless the authorization is terminated  or revoked sooner.       Influenza A by PCR NEGATIVE NEGATIVE Final   Influenza B by PCR NEGATIVE NEGATIVE Final    Comment: (NOTE) The Xpert Xpress SARS-CoV-2/FLU/RSV plus assay is intended as an aid in the diagnosis of influenza from Nasopharyngeal swab specimens and should not be used as a sole basis for treatment. Nasal washings and aspirates are unacceptable for Xpert Xpress SARS-CoV-2/FLU/RSV testing.  Fact Sheet for Patients: EntrepreneurPulse.com.au  Fact Sheet for Healthcare Providers: IncredibleEmployment.be  This test is not yet approved or cleared by the [place] FDA and has been authorized for detection and/or diagnosis of SARS-CoV-2 by FDA under an Emergency Use Authorization (EUA). This EUA will remain in effect (meaning this test can be used) for the duration of the COVID-19 declaration under Section 564(b)(1) of the Act, 21 U.S.C. section 360bbb-3(b)(1), unless the authorization is terminated or revoked.  Performed at [facility], [address], [place], [place] [address]   Urine Culture     Status: Abnormal   Collection Time: 08/02/20  9:47 AM   Specimen: Urine, Clean Catch  Result Value Ref Range Status   Specimen  Description   Final    URINE, CLEAN CATCH Performed at [facility], [address], [place], [place] [address]    Special Requests   Final    Normal Performed at [facility], [address], [place], [place] [address]    Culture 90,000 COLONIES/mL ESCHERICHIA COLI (A)  Final   Report Status 08/05/2020 FINAL  Final   Organism ID, Bacteria ESCHERICHIA COLI (A)  Final      Susceptibility   Escherichia coli - MIC*    AMPICILLIN 4 SENSITIVE Sensitive     CEFAZOLIN <=4 SENSITIVE Sensitive     CEFEPIME <=0.12 SENSITIVE Sensitive     CEFTRIAXONE <=0.25 SENSITIVE Sensitive     CIPROFLOXACIN >=4 RESISTANT Resistant     GENTAMICIN <=1 SENSITIVE Sensitive     IMIPENEM <=0.25 SENSITIVE Sensitive     NITROFURANTOIN <=16 SENSITIVE Sensitive     TRIMETH/SULFA <=20 SENSITIVE Sensitive     AMPICILLIN/SULBACTAM <=2 SENSITIVE Sensitive     PIP/TAZO <=4 SENSITIVE Sensitive     * 90,000 COLONIES/mL ESCHERICHIA COLI  Aerobic/Anaerobic Culture w Gram Stain (surgical/deep wound)     Status: None (Preliminary result)   Collection Time: 08/03/20  2:29 PM   Specimen: Tissue  Result Value Ref Range Status   Specimen Description TISSUE  Final   Special Requests BIOFILM ABDOMEN  Final   Gram Stain   Final    ABUNDANT WBC PRESENT, PREDOMINANTLY PMN FEW GRAM VARIABLE ROD RARE GRAM POSITIVE COCCI    Culture   Final    MODERATE GRAM NEGATIVE RODS IDENTIFICATION AND SUSCEPTIBILITIES TO FOLLOW Performed at [facility], [address], [place], [place] [address]    Report Status PENDING  Incomplete         Radiology Studies: No results found.    Scheduled Meds:  amiodarone  200 mg Oral Daily   amLODipine  2.5 mg Oral Daily   bisacodyl  10 mg Rectal BID   carvedilol  25 mg Oral BID WC   hydrALAZINE  100 mg Oral TID   isosorbide mononitrate  120 mg Oral QHS   metoprolol tartrate  5 mg Intravenous Q6H  pantoprazole (PROTONIX) IV  40 mg Intravenous Q24H   Continuous Infusions:   ciprofloxacin 400 mg (08/05/20 1018)   lactated ringers 50 mL/hr at 08/05/20 1016   metronidazole 500 mg (08/05/20 1457)     LOS: 3 days      Debbe Odea, MD [facility] Pager: [email or web address] 08/05/2020, 5:38 PM

## 2020-08-06 ENCOUNTER — Encounter (HOSPITAL_COMMUNITY): Payer: Self-pay | Admitting: Surgery

## 2020-08-06 LAB — BASIC METABOLIC PANEL
Anion gap: 7 (ref 5–15)
BUN: 47 mg/dL — ABNORMAL HIGH (ref 8–23)
CO2: 20 mmol/L — ABNORMAL LOW (ref 22–32)
Calcium: 8.5 mg/dL — ABNORMAL LOW (ref 8.9–10.3)
Chloride: 110 mmol/L (ref 98–111)
Creatinine, Ser: 1.87 mg/dL — ABNORMAL HIGH (ref 0.44–1.00)
GFR, Estimated: 26 mL/min — ABNORMAL LOW (ref >60)
Glucose, Bld: 107 mg/dL — ABNORMAL HIGH (ref 70–99)
Potassium: 4.1 mmol/L (ref 3.5–5.1)
Sodium: 137 mmol/L (ref 135–145)

## 2020-08-06 LAB — GLUCOSE, CAPILLARY
Glucose-Capillary: 108 mg/dL — ABNORMAL HIGH (ref 70–99)
Glucose-Capillary: 112 mg/dL — ABNORMAL HIGH (ref 70–99)
Glucose-Capillary: 120 mg/dL — ABNORMAL HIGH (ref 70–99)
Glucose-Capillary: 125 mg/dL — ABNORMAL HIGH (ref 70–99)
Glucose-Capillary: 141 mg/dL — ABNORMAL HIGH (ref 70–99)
Glucose-Capillary: 99 mg/dL (ref 70–99)
Glucose-Capillary: 99 mg/dL (ref 70–99)

## 2020-08-06 LAB — CBC
HCT: 28.8 % — ABNORMAL LOW (ref 36.0–46.0)
Hemoglobin: 9.2 g/dL — ABNORMAL LOW (ref 12.0–15.0)
MCH: 27.6 pg (ref 26.0–34.0)
MCHC: 31.9 g/dL (ref 30.0–36.0)
MCV: 86.5 fL (ref 80.0–100.0)
Platelets: 235 10*3/uL (ref 150–400)
RBC: 3.33 MIL/uL — ABNORMAL LOW (ref 3.87–5.11)
RDW: 17.8 % — ABNORMAL HIGH (ref 11.5–15.5)
WBC: 16.4 10*3/uL — ABNORMAL HIGH (ref 4.0–10.5)
nRBC: 0 % (ref 0.0–0.2)

## 2020-08-06 MED ORDER — SODIUM CHLORIDE 0.9 % IV SOLN
2.0000 g | INTRAVENOUS | Status: DC
  Administered 2020-08-06 – 2020-08-08 (×3): 2 g via INTRAVENOUS
  Filled 2020-08-06 (×3): qty 20

## 2020-08-06 MED ORDER — PANTOPRAZOLE SODIUM 40 MG PO TBEC
40.0000 mg | DELAYED_RELEASE_TABLET | Freq: Every day | ORAL | Status: DC
  Administered 2020-08-06 – 2020-08-09 (×4): 40 mg via ORAL
  Filled 2020-08-06 (×4): qty 1

## 2020-08-06 MED ORDER — ROSUVASTATIN CALCIUM 5 MG PO TABS
2.5000 mg | ORAL_TABLET | ORAL | Status: DC
  Administered 2020-08-06 – 2020-08-08 (×2): 2.5 mg via ORAL
  Filled 2020-08-06 (×4): qty 0.5

## 2020-08-06 NOTE — Progress Notes (Signed)
PROGRESS NOTE   Crystal [name]   UUV:[id number]  DOB: Jun 21, 1934  DOA: 08/01/2020 PCP: Glenda Chroman, MD   Brief Narrative:  Crystal [name] 86 y.o. female with medical history significant of iron deficiency anemia, Bell's palsy, history of breast cancer and right mastectomy, CAD, history of STEMI, chronic systolic heart failure, paroxysmal atrial fibrillation, stage IV chronic kidney disease, essential hypertension, GERD, gout, history of squamous cell carcinoma of the skin, mixed hyperlipidemia, osteopenia, thyroid nodule, type 2 diabetes mellitus who is coming to the emergency department due to abdominal pain, bloating with frequent dry heaving for the past 3 days.  He saw her primary care physician and a KUB revealed dilated bowel loops.  She presented to the ED due to increasing abdominal distention pain and dry heaving.  She had not had any vomiting.  CT abdomen pelvis: Although poorly visualized, there is suspected abnormal appendix in the right lower quadrant suggesting acute appendicitis.  Multiple dilated loops of small bowel in the left abdomen, sigmoid diverticulosis without diverticulitis, small to moderate abdominal pelvic ascites including fluid in the right lower quadrant. General surgery consulted and patient started on IV antibiotics.  Subjective: Pt reports that overall better but not back to her baseline.  She had a bowel movement.     Assessment & Plan:   Principal Problem:   Ileus ([place]) in setting of appendicitis and hyponatremia and hypokalemia -Continue to keep n.p.o. except for necessary medications - Continue normal saline, replace electrolytes aggressively and hold Demadex (takes 40 mg daily) - Holding Eliquis for possible surgery - last taken on 7/5 -Continue as needed IV morphine, IV Protonix, as needed IV Zofran -General surgery has ordered a UA due to suprapubic pain and a twice daily Dulcolax suppository -7/8>>  ordered an NG tube and an x-ray was later  seen by surgery and taken to the OR for lap appendectomy-found to have a gangrenous ruptured appendix with peritonitis -  NG removed on 7/9- advanced to full liquid diet - Pt had a bowel movement 7/11   Hyponatremia Acute kidney injury superimposed on chronic kidney disease stage IV Grade 2 d CHF - Baseline creatinine is 2.0-2.4 - Presented with a creatinine of 3.57 and a sodium of 127, now creatinine improved to 1.87 - May be prerenal -holding Demadex  -  Cr improving- DC'd IVF - maintain continuous pulse ox    CAD (coronary artery disease) with DES to mid LAD 1/21 Hypertension, chronic diastolic heart failure grade 2 Paroxysmal atrial fibrillation -Plavix stopped by her cardiologist on 06/07/2020 due to significant amount of bruising and the fact that she was already on Eliquis -Last echo in 06/05/2020 revealed grade 2 diastolic dysfunction, mildly elevated pulmonary artery systolic pressure, mild to moderate mitral regurgitation, moderate tricuspid regurgitation -Holding Eliquis after surgery - BP better controlled today> resume Norvasc, Coreg, Hydralazine and DC IV Metoprolol- use PRN Hydralazine for SBP > 180  Hypokalemia - Replaced-   Glucose intolerance - Last A1c 5.8-not on any medications at home according to her med list -CBGs ordered   Hyperlipidemia -resume home low dose 2.5 mg every 48 hours    Iron deficiency anemia -Hold iron tablets  History of breast cancer status post right-sided mastectomy  Time spent in minutes: 35 mins DVT prophylaxis: SCD's Start: 08/03/20 1703 Place and maintain sequential compression device Start: 08/02/20 0800  Code Status: Full code Family Communication:  Level of Care: Level of care: Telemetry Disposition Plan:  Status is: Inpatient  Remains inpatient  appropriate because:IV treatments appropriate due to intensity of illness or inability to take PO  Dispo: The patient is from: Home              Anticipated d/c is to: Home               Patient currently is not medically stable to d/c.   Difficult to place patient No  Consultants:  General surgery Procedures:  None Antimicrobials:  Anti-infectives (From admission, onward)    Start     Dose/Rate Route Frequency Ordered Stop   08/06/20 1200  cefTRIAXone (ROCEPHIN) 2 g in sodium chloride 0.9 % 100 mL IVPB        2 g 200 mL/hr over 30 Minutes Intravenous Every 24 hours 08/06/20 1112     08/04/20 1000  ciprofloxacin (CIPRO) IVPB 400 mg  Status:  Discontinued        400 mg 200 mL/hr over 60 Minutes Intravenous Every 24 hours 08/03/20 1044 08/06/20 1112   08/02/20 1100  ciprofloxacin (CIPRO) IVPB 400 mg  Status:  Discontinued        400 mg 200 mL/hr over 60 Minutes Intravenous Every 12 hours 08/01/20 2219 08/03/20 1044   08/02/20 0600  metroNIDAZOLE (FLAGYL) IVPB 500 mg        500 mg 100 mL/hr over 60 Minutes Intravenous Every 8 hours 08/01/20 2219     08/01/20 2145  ciprofloxacin (CIPRO) IVPB 400 mg        400 mg 200 mL/hr over 60 Minutes Intravenous  Once 08/01/20 2139 08/01/20 2337   08/01/20 2145  metroNIDAZOLE (FLAGYL) IVPB 500 mg        500 mg 100 mL/hr over 60 Minutes Intravenous  Once 08/01/20 2139 08/01/20 2324      Objective: Vitals:   08/05/20 2011 08/06/20 0422 08/06/20 0804 08/06/20 1237  BP: (!) 172/58 (!) 156/54 (!) 150/47 (!) 122/45  Pulse: (!) 59 (!) 59  (!) 55  Resp: 19 18  18   Temp: 98.7 F (37.1 C) 98.6 F (37 C)  97.8 F (36.6 C)  TempSrc: Oral Oral  Oral  SpO2: 99% 96%  97%  Weight:      Height:        Intake/Output Summary (Last 24 hours) at 08/06/2020 1410 Last data filed at 08/06/2020 0900 Gross per 24 hour  Intake 1222.85 ml  Output 700 ml  Net 522.85 ml   Filed Weights   08/01/20 1546  Weight: 56.2 kg   Examination: General exam: Appears comfortable  HEENT: PERRLA, oral mucosa moist, no sclera icterus or thrush Respiratory system: Clear to auscultation. Respiratory effort normal. Cardiovascular system: S1 & S2  heard, regular rate and rhythm Gastrointestinal system: Abdomen soft, non-tender, moderately distended. Normal bowel sounds   Central nervous system: Alert and oriented. No focal neurological deficits. Extremities: No cyanosis, clubbing or edema Skin: No rashes or ulcers Psychiatry:  Mood & affect appropriate.    Data Reviewed: I have personally reviewed following labs and imaging studies  CBC: Recent Labs  Lab 08/01/20 1508 08/02/20 0440 08/03/20 0358 08/04/20 0432 08/05/20 0552 08/06/20 0426  WBC 8.5 9.1 10.0 8.5 13.6* 16.4*  NEUTROABS 7.7  --   --   --   --   --   HGB 11.1* 9.2* 8.9* 9.2* 9.6* 9.2*  HCT 33.6* 27.7* 26.7* 27.8* 30.1* 28.8*  MCV 85.7 84.2 84.0 84.0 87.5 86.5  PLT 170 151 157 173 218 947   Basic Metabolic Panel: Recent Labs  Lab 08/02/20 0440 08/03/20 0358 08/04/20 0432 08/05/20 0552 08/06/20 0426  NA 128* 132* 135 136 137  K 3.4* 3.8 4.0 3.9 4.1  CL 96* 102 109 111 110  CO2 19* 19* 20* 20* 20*  GLUCOSE 74 70 169* 123* 107*  BUN 66* 63* 56* 53* 47*  CREATININE 3.36* 2.79* 2.34* 2.13* 1.87*  CALCIUM 8.4* 8.3* 8.4* 8.6* 8.5*  MG 1.7  --   --   --   --    GFR: Estimated Creatinine Clearance: 19.7 mL/min (A) (by C-G formula based on SCr of 1.87 mg/dL (H)). Liver Function Tests: Recent Labs  Lab 08/01/20 1508 08/02/20 0440  AST 50* 30  ALT 43 30  ALKPHOS 66 49  BILITOT 1.2 1.1  PROT 6.7 5.4*  ALBUMIN 3.6 2.8*   Recent Labs  Lab 08/01/20 1508  LIPASE 21   No results for input(s): AMMONIA in the last 168 hours. Coagulation Profile: No results for input(s): INR, PROTIME in the last 168 hours. Cardiac Enzymes: No results for input(s): CKTOTAL, CKMB, CKMBINDEX, TROPONINI in the last 168 hours. BNP (last 3 results) No results for input(s): PROBNP in the last 8760 hours. HbA1C: No results for input(s): HGBA1C in the last 72 hours.  CBG: Recent Labs  Lab 08/05/20 2008 08/06/20 0004 08/06/20 0419 08/06/20 0728 08/06/20 1107  GLUCAP  137* 108* 99 99 141*   Lipid Profile: No results for input(s): CHOL, HDL, LDLCALC, TRIG, CHOLHDL, LDLDIRECT in the last 72 hours. Thyroid Function Tests: No results for input(s): TSH, T4TOTAL, FREET4, T3FREE, THYROIDAB in the last 72 hours. Anemia Panel: No results for input(s): VITAMINB12, FOLATE, FERRITIN, TIBC, IRON, RETICCTPCT in the last 72 hours. Urine analysis:    Component Value Date/Time   COLORURINE YELLOW 08/02/2020 [place] 08/02/2020 0947   LABSPEC 1.009 08/02/2020 0947   PHURINE 5.0 08/02/2020 0947   GLUCOSEU NEGATIVE 08/02/2020 0947   HGBUR SMALL (A) 08/02/2020 0947   BILIRUBINUR NEGATIVE 08/02/2020 0947   KETONESUR NEGATIVE 08/02/2020 0947   PROTEINUR 100 (A) 08/02/2020 0947   NITRITE NEGATIVE 08/02/2020 0947   LEUKOCYTESUR NEGATIVE 08/02/2020 0947    Recent Results (from the past 240 hour(s))  Resp Panel by RT-PCR (Flu A&B, Covid) Nasopharyngeal Swab     Status: None   Collection Time: 08/01/20  8:03 PM   Specimen: Nasopharyngeal Swab; Nasopharyngeal(NP) swabs in vial transport medium  Result Value Ref Range Status   SARS Coronavirus 2 by RT PCR NEGATIVE NEGATIVE Final    Comment: (NOTE) SARS-CoV-2 target nucleic acids are NOT DETECTED.  The SARS-CoV-2 RNA is generally detectable in upper respiratory specimens during the acute phase of infection. The lowest concentration of SARS-CoV-2 viral copies this assay can detect is 138 copies/mL. A negative result does not preclude SARS-Cov-2 infection and should not be used as the sole basis for treatment or other patient management decisions. A negative result may occur with  improper specimen collection/handling, submission of specimen other than nasopharyngeal swab, presence of viral mutation(s) within the areas targeted by this assay, and inadequate number of viral copies(<138 copies/mL). A negative result must be combined with clinical observations, patient history, and  epidemiological information. The expected result is Negative.  Fact Sheet for Patients:  EntrepreneurPulse.com.au  Fact Sheet for Healthcare Providers:  IncredibleEmployment.be  This test is no t yet approved or cleared by the [place] FDA and  has been authorized for detection and/or diagnosis of SARS-CoV-2 by FDA under an Emergency Use Authorization (EUA). This EUA  will remain  in effect (meaning this test can be used) for the duration of the COVID-19 declaration under Section 564(b)(1) of the Act, 21 U.S.C.section 360bbb-3(b)(1), unless the authorization is terminated  or revoked sooner.       Influenza A by PCR NEGATIVE NEGATIVE Final   Influenza B by PCR NEGATIVE NEGATIVE Final    Comment: (NOTE) The Xpert Xpress SARS-CoV-2/FLU/RSV plus assay is intended as an aid in the diagnosis of influenza from Nasopharyngeal swab specimens and should not be used as a sole basis for treatment. Nasal washings and aspirates are unacceptable for Xpert Xpress SARS-CoV-2/FLU/RSV testing.  Fact Sheet for Patients: EntrepreneurPulse.com.au  Fact Sheet for Healthcare Providers: IncredibleEmployment.be  This test is not yet approved or cleared by the [place] FDA and has been authorized for detection and/or diagnosis of SARS-CoV-2 by FDA under an Emergency Use Authorization (EUA). This EUA will remain in effect (meaning this test can be used) for the duration of the COVID-19 declaration under Section 564(b)(1) of the Act, 21 U.S.C. section 360bbb-3(b)(1), unless the authorization is terminated or revoked.  Performed at [facility], [address] [place], [place] [address]   Urine Culture     Status: Abnormal   Collection Time: 08/02/20  9:47 AM   Specimen: Urine, Clean Catch  Result Value Ref Range Status   Specimen Description   Final    URINE, CLEAN CATCH Performed at [facility], [address] [place], [place] [address]    Special Requests   Final    Normal Performed at [facility], [address] [place], [place] [address]    Culture 90,000 COLONIES/mL ESCHERICHIA COLI (A)  Final   Report Status 08/05/2020 FINAL  Final   Organism ID, Bacteria ESCHERICHIA COLI (A)  Final      Susceptibility   Escherichia coli - MIC*    AMPICILLIN 4 SENSITIVE Sensitive     CEFAZOLIN <=4 SENSITIVE Sensitive     CEFEPIME <=0.12 SENSITIVE Sensitive     CEFTRIAXONE <=0.25 SENSITIVE Sensitive     CIPROFLOXACIN >=4 RESISTANT Resistant     GENTAMICIN <=1 SENSITIVE Sensitive     IMIPENEM <=0.25 SENSITIVE Sensitive     NITROFURANTOIN <=16 SENSITIVE Sensitive     TRIMETH/SULFA <=20 SENSITIVE Sensitive     AMPICILLIN/SULBACTAM <=2 SENSITIVE Sensitive     PIP/TAZO <=4 SENSITIVE Sensitive     * 90,000 COLONIES/mL ESCHERICHIA COLI  Aerobic/Anaerobic Culture w Gram Stain (surgical/deep wound)     Status: None (Preliminary result)   Collection Time: 08/03/20  2:29 PM   Specimen: Tissue  Result Value Ref Range Status   Specimen Description TISSUE  Final   Special Requests BIOFILM ABDOMEN  Final   Gram Stain   Final    ABUNDANT WBC PRESENT, PREDOMINANTLY PMN FEW GRAM VARIABLE ROD RARE GRAM POSITIVE COCCI    Culture   Final    MODERATE ESCHERICHIA COLI HOLDING FOR POSSIBLE ANAEROBE Performed at [facility], [address], [place], [place] [address]    Report Status PENDING  Incomplete   Organism ID, Bacteria ESCHERICHIA COLI  Final      Susceptibility   Escherichia coli - MIC*    AMPICILLIN 16 INTERMEDIATE Intermediate     CEFAZOLIN <=4 SENSITIVE Sensitive     CEFEPIME <=0.12 SENSITIVE Sensitive     CEFTAZIDIME <=1 SENSITIVE Sensitive     CEFTRIAXONE <=0.25 SENSITIVE Sensitive     CIPROFLOXACIN >=4 RESISTANT Resistant     GENTAMICIN <=1 SENSITIVE Sensitive  IMIPENEM <=0.25 SENSITIVE Sensitive     TRIMETH/SULFA >=320 RESISTANT Resistant     AMPICILLIN/SULBACTAM <=2 SENSITIVE  Sensitive     PIP/TAZO <=4 SENSITIVE Sensitive     * MODERATE ESCHERICHIA COLI    Radiology Studies: No results found. Scheduled Meds:  amiodarone  200 mg Oral Daily   amLODipine  2.5 mg Oral Daily   bisacodyl  10 mg Rectal BID   carvedilol  25 mg Oral BID WC   hydrALAZINE  100 mg Oral TID   isosorbide mononitrate  120 mg Oral QHS   pantoprazole  40 mg Oral Q1200   Continuous Infusions:  cefTRIAXone (ROCEPHIN)  IV 2 g (08/06/20 1400)   lactated ringers 50 mL/hr at 08/05/20 1016   metronidazole 500 mg (08/06/20 0519)     LOS: 4 days   Irwin Brakeman, MD [facility] How to contact the [facility] Attending or Consulting provider [place] or covering provider during after hours [place], for this patient?  Check the care team in [facility] and look for a) attending/consulting [facility] provider listed and b) the [facility] team listed Log into [email or web address] and use 's universal password to access. If you do not have the password, please contact the hospital operator. Locate the [facility] provider you are looking for under [facility] and page to a number that you can be directly reached. If you still have difficulty reaching the provider, please page the [facility] (Director on Call) for the Hospitalists listed on amion for assistance.  [email or web address] 08/06/2020, 2:10 PM

## 2020-08-06 NOTE — Progress Notes (Signed)
3 Days Post-Op  Subjective: Patient denies any significant abdominal pain.  Did have a bowel movement yesterday.  Objective: Vital [name] in last 24 hours: Temp:  [98.4 F (36.9 C)-98.7 F (37.1 C)] 98.6 F (37 C) (07/11 0422) Pulse Rate:  [59-62] 59 (07/11 0422) Resp:  [16-19] 18 (07/11 0422) BP: (150-178)/(47-58) 150/47 (07/11 0804) SpO2:  [94 %-99 %] 96 % (07/11 0422) Last BM Date: 07/30/20  Intake/Output from previous day: 07/10 0701 - 07/11 0700 In: 1222.9 [P.O.:720; I.V.:2.9; IV Piggyback:500] Out: 700 [Urine:700] Intake/Output this shift: Total I/O In: 480 [P.O.:480] Out: -   General appearance: alert, cooperative, and no distress GI: Soft, less distended.  Incisions healing well.  Occasional bowel sounds appreciated.  Lab Results:  Recent Labs    08/05/20 0552 08/06/20 0426  WBC 13.6* 16.4*  HGB 9.6* 9.2*  HCT 30.1* 28.8*  PLT 218 235   BMET Recent Labs    08/05/20 0552 08/06/20 0426  NA 136 137  K 3.9 4.1  CL 111 110  CO2 20* 20*  GLUCOSE 123* 107*  BUN 53* 47*  CREATININE 2.13* 1.87*  CALCIUM 8.6* 8.5*   PT/INR No results for input(s): LABPROT, INR in the last 72 hours.  Studies/Results: No results found.  Anti-infectives: Anti-infectives (From admission, onward)    Start     Dose/Rate Route Frequency Ordered Stop   08/06/20 1200  cefTRIAXone (ROCEPHIN) 2 g in sodium chloride 0.9 % 100 mL IVPB        2 g 200 mL/hr over 30 Minutes Intravenous Every 24 hours 08/06/20 1112     08/04/20 1000  ciprofloxacin (CIPRO) IVPB 400 mg  Status:  Discontinued        400 mg 200 mL/hr over 60 Minutes Intravenous Every 24 hours 08/03/20 1044 08/06/20 1112   08/02/20 1100  ciprofloxacin (CIPRO) IVPB 400 mg  Status:  Discontinued        400 mg 200 mL/hr over 60 Minutes Intravenous Every 12 hours 08/01/20 2219 08/03/20 1044   08/02/20 0600  metroNIDAZOLE (FLAGYL) IVPB 500 mg        500 mg 100 mL/hr over 60 Minutes Intravenous Every 8 hours 08/01/20 2219      08/01/20 2145  ciprofloxacin (CIPRO) IVPB 400 mg        400 mg 200 mL/hr over 60 Minutes Intravenous  Once 08/01/20 2139 08/01/20 2337   08/01/20 2145  metroNIDAZOLE (FLAGYL) IVPB 500 mg        500 mg 100 mL/hr over 60 Minutes Intravenous  Once 08/01/20 2139 08/01/20 2324       Assessment/Plan: s/p Procedure(s): APPENDECTOMY LAPAROSCOPIC Postoperative day 3, status post laparoscopic appendectomy.  Patient's bowel function is starting to return.  I am concerned about her rising leukocytosis.  Should she continue to have a rise in her white blood cell count, a repeat CT scan of the abdomen is necessary to rule out intra-abdominal abscess.  Would continue holding Eliquis for now.  I will advance her to a full liquid diet.  Discussed with Dr. Wynetta Emery.  LOS: 4 days    Crystal [name] 08/06/2020

## 2020-08-07 ENCOUNTER — Inpatient Hospital Stay (HOSPITAL_COMMUNITY): Payer: Medicare Other

## 2020-08-07 LAB — CBC
HCT: 24.7 % — ABNORMAL LOW (ref 36.0–46.0)
Hemoglobin: 8.2 g/dL — ABNORMAL LOW (ref 12.0–15.0)
MCH: 28.5 pg (ref 26.0–34.0)
MCHC: 33.2 g/dL (ref 30.0–36.0)
MCV: 85.8 fL (ref 80.0–100.0)
Platelets: 219 10*3/uL (ref 150–400)
RBC: 2.88 MIL/uL — ABNORMAL LOW (ref 3.87–5.11)
RDW: 17.5 % — ABNORMAL HIGH (ref 11.5–15.5)
WBC: 16.7 10*3/uL — ABNORMAL HIGH (ref 4.0–10.5)
nRBC: 0 % (ref 0.0–0.2)

## 2020-08-07 LAB — BASIC METABOLIC PANEL
Anion gap: 5 (ref 5–15)
BUN: 47 mg/dL — ABNORMAL HIGH (ref 8–23)
CO2: 18 mmol/L — ABNORMAL LOW (ref 22–32)
Calcium: 7.7 mg/dL — ABNORMAL LOW (ref 8.9–10.3)
Chloride: 109 mmol/L (ref 98–111)
Creatinine, Ser: 1.99 mg/dL — ABNORMAL HIGH (ref 0.44–1.00)
GFR, Estimated: 24 mL/min — ABNORMAL LOW (ref >60)
Glucose, Bld: 112 mg/dL — ABNORMAL HIGH (ref 70–99)
Potassium: 3.6 mmol/L (ref 3.5–5.1)
Sodium: 132 mmol/L — ABNORMAL LOW (ref 135–145)

## 2020-08-07 LAB — GLUCOSE, CAPILLARY
Glucose-Capillary: 97 mg/dL (ref 70–99)
Glucose-Capillary: 91 mg/dL (ref 70–99)
Glucose-Capillary: 137 mg/dL — ABNORMAL HIGH (ref 70–99)
Glucose-Capillary: 125 mg/dL — ABNORMAL HIGH (ref 70–99)
Glucose-Capillary: 147 mg/dL — ABNORMAL HIGH (ref 70–99)

## 2020-08-07 LAB — SURGICAL PATHOLOGY

## 2020-08-07 MED ORDER — IOHEXOL 9 MG/ML PO SOLN
ORAL | Status: AC
  Administered 2020-08-07: 500 mL
  Filled 2020-08-07: qty 1000

## 2020-08-07 MED ORDER — TORSEMIDE 20 MG PO TABS
40.0000 mg | ORAL_TABLET | Freq: Every day | ORAL | Status: DC
  Administered 2020-08-08: 40 mg via ORAL
  Filled 2020-08-07: qty 2

## 2020-08-07 MED ORDER — NITROGLYCERIN 0.4 MG SL SUBL: 0.4000 mg | SUBLINGUAL_TABLET | SUBLINGUAL | Status: DC | PRN

## 2020-08-07 MED ORDER — FUROSEMIDE 10 MG/ML IJ SOLN
40.0000 mg | Freq: Two times a day (BID) | INTRAMUSCULAR | Status: AC
  Administered 2020-08-07 (×2): 40 mg via INTRAVENOUS
  Filled 2020-08-07 (×2): qty 4

## 2020-08-07 MED ORDER — ADULT MULTIVITAMIN W/MINERALS CH
1.0000 | ORAL_TABLET | Freq: Every day | ORAL | Status: DC
  Administered 2020-08-07 – 2020-08-10 (×4): 1 via ORAL
  Filled 2020-08-07 (×4): qty 1

## 2020-08-07 MED ORDER — POTASSIUM CHLORIDE CRYS ER 20 MEQ PO TBCR
20.0000 meq | EXTENDED_RELEASE_TABLET | Freq: Two times a day (BID) | ORAL | Status: DC
  Administered 2020-08-07: 20 meq via ORAL
  Filled 2020-08-07: qty 1

## 2020-08-07 MED ORDER — FERROUS SULFATE 325 (65 FE) MG PO TABS
325.0000 mg | ORAL_TABLET | Freq: Every day | ORAL | Status: DC
  Administered 2020-08-08 – 2020-08-09 (×2): 325 mg via ORAL
  Filled 2020-08-07 (×2): qty 1

## 2020-08-07 MED ORDER — POTASSIUM CHLORIDE CRYS ER 20 MEQ PO TBCR
40.0000 meq | EXTENDED_RELEASE_TABLET | Freq: Two times a day (BID) | ORAL | Status: DC
  Administered 2020-08-07 – 2020-08-10 (×6): 40 meq via ORAL
  Filled 2020-08-07 (×6): qty 2

## 2020-08-07 MED ORDER — APIXABAN 2.5 MG PO TABS
2.5000 mg | ORAL_TABLET | Freq: Two times a day (BID) | ORAL | Status: DC
  Administered 2020-08-07 – 2020-08-08 (×3): 2.5 mg via ORAL
  Filled 2020-08-07 (×4): qty 1

## 2020-08-07 NOTE — Progress Notes (Signed)
4 Days Post-Op  Subjective: Patient still feels a little bloated, but is moving her bowels.  She is hungry and tolerating full liquid diet well.  Objective: Vital [name] in last 24 hours: Temp:  [97.8 F (36.6 C)-98.9 F (37.2 C)] 98.9 F (37.2 C) (07/12 0503) Pulse Rate:  [54-57] 57 (07/12 0503) Resp:  [18-19] 18 (07/12 0503) BP: (122-136)/(43-58) 136/48 (07/12 0756) SpO2:  [96 %-99 %] 96 % (07/12 0503) Last BM Date: 08/07/20  Intake/Output from previous day: 07/11 0701 - 07/12 0700 In: 1360 [P.O.:960; IV Piggyback:400] Out: 2 [Stool:2] Intake/Output this shift: No intake/output data recorded.  General appearance: alert, cooperative, and mild distress GI: Soft with mild distention noted.  Incisions healing well.  Bowel sounds present.  Nontender.  Lab Results:  Recent Labs    08/06/20 0426 08/07/20 0106  WBC 16.4* 16.7*  HGB 9.2* 8.2*  HCT 28.8* 24.7*  PLT 235 219   BMET Recent Labs    08/06/20 0426 08/07/20 0106  NA 137 132*  K 4.1 3.6  CL 110 109  CO2 20* 18*  GLUCOSE 107* 112*  BUN 47* 47*  CREATININE 1.87* 1.99*  CALCIUM 8.5* 7.7*   PT/INR No results for input(s): LABPROT, INR in the last 72 hours.  Studies/Results: No results found.  Anti-infectives: Anti-infectives (From admission, onward)    Start     Dose/Rate Route Frequency Ordered Stop   08/06/20 1200  cefTRIAXone (ROCEPHIN) 2 g in sodium chloride 0.9 % 100 mL IVPB        2 g 200 mL/hr over 30 Minutes Intravenous Every 24 hours 08/06/20 1112     08/04/20 1000  ciprofloxacin (CIPRO) IVPB 400 mg  Status:  Discontinued        400 mg 200 mL/hr over 60 Minutes Intravenous Every 24 hours 08/03/20 1044 08/06/20 1112   08/02/20 1100  ciprofloxacin (CIPRO) IVPB 400 mg  Status:  Discontinued        400 mg 200 mL/hr over 60 Minutes Intravenous Every 12 hours 08/01/20 2219 08/03/20 1044   08/02/20 0600  metroNIDAZOLE (FLAGYL) IVPB 500 mg        500 mg 100 mL/hr over 60 Minutes Intravenous Every 8  hours 08/01/20 2219     08/01/20 2145  ciprofloxacin (CIPRO) IVPB 400 mg        400 mg 200 mL/hr over 60 Minutes Intravenous  Once 08/01/20 2139 08/01/20 2337   08/01/20 2145  metroNIDAZOLE (FLAGYL) IVPB 500 mg        500 mg 100 mL/hr over 60 Minutes Intravenous  Once 08/01/20 2139 08/01/20 2324       Assessment/Plan: s/p Procedure(s): APPENDECTOMY LAPAROSCOPIC Impression: Postoperative day 4, status post laparoscopic appendectomy for appendicitis.  Patient has persistent leukocytosis despite being on IV antibiotics.  We will get CT scan of the abdomen and pelvis today to rule out intra-abdominal abscess.  Further management is pending those results.  LOS: 5 days    Crystal [name] 08/07/2020

## 2020-08-07 NOTE — Progress Notes (Addendum)
PROGRESS NOTE   Crystal [name]   YKZ:[id number]  DOB: 07/16/1934  DOA: 08/01/2020 PCP: Glenda Chroman, MD   Brief Narrative:  Crystal [name] 86 y.o. female with medical history significant of iron deficiency anemia, Bell's palsy, history of breast cancer and right mastectomy, CAD, history of STEMI, chronic systolic heart failure, paroxysmal atrial fibrillation, stage IV chronic kidney disease, essential hypertension, GERD, gout, history of squamous cell carcinoma of the skin, mixed hyperlipidemia, osteopenia, thyroid nodule, type 2 diabetes mellitus who is coming to the emergency department due to abdominal pain, bloating with frequent dry heaving for the past 3 days.  He saw her primary care physician and a KUB revealed dilated bowel loops.  She presented to the ED due to increasing abdominal distention pain and dry heaving.  She had not had any vomiting.  CT abdomen pelvis: Although poorly visualized, there is suspected abnormal appendix in the right lower quadrant suggesting acute appendicitis.  Multiple dilated loops of small bowel in the left abdomen, sigmoid diverticulosis without diverticulitis, small to moderate abdominal pelvic ascites including fluid in the right lower quadrant. General surgery consulted and patient started on IV antibiotics.  Subjective: Pt is continuing to have BMs but also reporting fluid build up in feet and legs and arms.  IV fluids have stopped last 2 days.      Assessment & Plan:     Ileus ([place]) in setting of appendicitis and hyponatremia and hypokalemia -Continue to keep n.p.o. except for necessary medications - Continue normal saline, replace electrolytes aggressively and hold Demadex (takes 40 mg daily) - Holding Eliquis for possible surgery - last taken on 7/5 -Continue as needed IV morphine, IV Protonix, as needed IV Zofran -General surgery has ordered a UA due to suprapubic pain and a twice daily Dulcolax suppository -7/8>>  ordered an NG tube  and an x-ray was later seen by surgery and taken to the OR for lap appendectomy-found to have a gangrenous ruptured appendix with peritonitis -  NG removed on 7/9- advanced to full liquid diet - Pt had a bowel movement 7/11  **UPDATE: spoke with Dr. Arnoldo Morale, ok to restart apixaban 08/07/20 and he had reviewed CT scan with radiologist  Hyponatremia Acute kidney injury superimposed on chronic kidney disease stage IV Grade 2 d CHF - Baseline creatinine is 2.0-2.4 - Presented with a creatinine of 3.57 and a sodium of 127, now creatinine improved to 1.87 - May be prerenal -holding Demadex while NPO. - starting IV lasix 40 mg BID x 3 doses, follow lytes  -  Cr improving- DC'd IVF - maintain continuous pulse ox    CAD (coronary artery disease) with DES to mid LAD 1/21 Hypertension, chronic diastolic heart failure grade 2 Paroxysmal atrial fibrillation -Plavix stopped by her cardiologist on 06/07/2020 due to significant amount of bruising and the fact that she was already on Eliquis -Last echo in 06/05/2020 revealed grade 2 diastolic dysfunction, mildly elevated pulmonary artery systolic pressure, mild to moderate mitral regurgitation, moderate tricuspid regurgitation -Holding Eliquis after surgery - BP better controlled today> resume Norvasc, Coreg, Hydralazine and DC IV Metoprolol- use PRN Hydralazine for SBP > 180  Hypokalemia - oral potassium replacement ordered  Glucose intolerance - Last A1c 5.8-not on any medications at home according to her med list -CBGs ordered   Hyperlipidemia -resume home low dose 2.5 mg every 48 hours    Iron deficiency anemia -Hold iron tablets  History of breast cancer status post right-sided mastectomy  Time spent in  minutes: 35 mins DVT prophylaxis: SCD's Start: 08/03/20 1703 Place and maintain sequential compression device Start: 08/02/20 0800  Code Status: Full code Family Communication:  Level of Care: Level of care: Telemetry Disposition Plan:   Status is: Inpatient  Remains inpatient appropriate because:IV treatments appropriate due to intensity of illness or inability to take PO  Dispo: The patient is from: Home              Anticipated d/c is to: Home              Patient currently is not medically stable to d/c.   Difficult to place patient No  Consultants:  General surgery Procedures:  None Antimicrobials:  Anti-infectives (From admission, onward)    Start     Dose/Rate Route Frequency Ordered Stop   08/06/20 1200  cefTRIAXone (ROCEPHIN) 2 g in sodium chloride 0.9 % 100 mL IVPB        2 g 200 mL/hr over 30 Minutes Intravenous Every 24 hours 08/06/20 1112     08/04/20 1000  ciprofloxacin (CIPRO) IVPB 400 mg  Status:  Discontinued        400 mg 200 mL/hr over 60 Minutes Intravenous Every 24 hours 08/03/20 1044 08/06/20 1112   08/02/20 1100  ciprofloxacin (CIPRO) IVPB 400 mg  Status:  Discontinued        400 mg 200 mL/hr over 60 Minutes Intravenous Every 12 hours 08/01/20 2219 08/03/20 1044   08/02/20 0600  metroNIDAZOLE (FLAGYL) IVPB 500 mg        500 mg 100 mL/hr over 60 Minutes Intravenous Every 8 hours 08/01/20 2219     08/01/20 2145  ciprofloxacin (CIPRO) IVPB 400 mg        400 mg 200 mL/hr over 60 Minutes Intravenous  Once 08/01/20 2139 08/01/20 2337   08/01/20 2145  metroNIDAZOLE (FLAGYL) IVPB 500 mg        500 mg 100 mL/hr over 60 Minutes Intravenous  Once 08/01/20 2139 08/01/20 2324      Objective: Vitals:   08/06/20 1648 08/06/20 2000 08/07/20 0503 08/07/20 0756  BP: (!) 133/43 (!) 135/58 (!) 124/48 (!) 136/48  Pulse:  (!) 54 (!) 57   Resp:  19 18   Temp:  98.6 F (37 C) 98.9 F (37.2 C)   TempSrc:  Oral Oral   SpO2:  99% 96%   Weight:      Height:        Intake/Output Summary (Last 24 hours) at 08/07/2020 1215 Last data filed at 08/07/2020 1000 Gross per 24 hour  Intake 1360 ml  Output 2 ml  Net 1358 ml   Filed Weights   08/01/20 1546  Weight: 56.2 kg   Examination: General exam:  Appears comfortable  HEENT: PERRLA, oral mucosa moist, no sclera icterus or thrush Respiratory system: Clear to auscultation. Respiratory effort normal. Cardiovascular system: S1 & S2 heard, regular rate and rhythm Gastrointestinal system: Abdomen soft, non-tender, moderately distended. Normal bowel sounds   Central nervous system: Alert and oriented. No focal neurological deficits. Extremities: 1+bilateral pedal edema Skin: No rashes or ulcers Psychiatry:  Mood & affect appropriate.    Data Reviewed: I have personally reviewed following labs and imaging studies  CBC: Recent Labs  Lab 08/01/20 1508 08/02/20 0440 08/03/20 0358 08/04/20 0432 08/05/20 0552 08/06/20 0426 08/07/20 0106  WBC 8.5   < > 10.0 8.5 13.6* 16.4* 16.7*  NEUTROABS 7.7  --   --   --   --   --   --  HGB 11.1*   < > 8.9* 9.2* 9.6* 9.2* 8.2*  HCT 33.6*   < > 26.7* 27.8* 30.1* 28.8* 24.7*  MCV 85.7   < > 84.0 84.0 87.5 86.5 85.8  PLT 170   < > 157 173 218 235 219   < > = values in this interval not displayed.   Basic Metabolic Panel: Recent Labs  Lab 08/02/20 0440 08/03/20 0358 08/04/20 0432 08/05/20 0552 08/06/20 0426 08/07/20 0106  NA 128* 132* 135 136 137 132*  K 3.4* 3.8 4.0 3.9 4.1 3.6  CL 96* 102 109 111 110 109  CO2 19* 19* 20* 20* 20* 18*  GLUCOSE 74 70 169* 123* 107* 112*  BUN 66* 63* 56* 53* 47* 47*  CREATININE 3.36* 2.79* 2.34* 2.13* 1.87* 1.99*  CALCIUM 8.4* 8.3* 8.4* 8.6* 8.5* 7.7*  MG 1.7  --   --   --   --   --    GFR: Estimated Creatinine Clearance: 18.5 mL/min (A) (by C-G formula based on SCr of 1.99 mg/dL (H)). Liver Function Tests: Recent Labs  Lab 08/01/20 1508 08/02/20 0440  AST 50* 30  ALT 43 30  ALKPHOS 66 49  BILITOT 1.2 1.1  PROT 6.7 5.4*  ALBUMIN 3.6 2.8*   Recent Labs  Lab 08/01/20 1508  LIPASE 21   No results for input(s): AMMONIA in the last 168 hours. Coagulation Profile: No results for input(s): INR, PROTIME in the last 168 hours. Cardiac Enzymes: No  results for input(s): CKTOTAL, CKMB, CKMBINDEX, TROPONINI in the last 168 hours. BNP (last 3 results) No results for input(s): PROBNP in the last 8760 hours. HbA1C: No results for input(s): HGBA1C in the last 72 hours.  CBG: Recent Labs  Lab 08/06/20 2004 08/06/20 2355 08/07/20 0407 08/07/20 0713 08/07/20 1111  GLUCAP 120* 112* 97 91 137*   Lipid Profile: No results for input(s): CHOL, HDL, LDLCALC, TRIG, CHOLHDL, LDLDIRECT in the last 72 hours. Thyroid Function Tests: No results for input(s): TSH, T4TOTAL, FREET4, T3FREE, THYROIDAB in the last 72 hours. Anemia Panel: No results for input(s): VITAMINB12, FOLATE, FERRITIN, TIBC, IRON, RETICCTPCT in the last 72 hours. Urine analysis:    Component Value Date/Time   COLORURINE YELLOW 08/02/2020 [place] 08/02/2020 0947   LABSPEC 1.009 08/02/2020 0947   PHURINE 5.0 08/02/2020 0947   GLUCOSEU NEGATIVE 08/02/2020 0947   HGBUR SMALL (A) 08/02/2020 0947   BILIRUBINUR NEGATIVE 08/02/2020 0947   KETONESUR NEGATIVE 08/02/2020 0947   PROTEINUR 100 (A) 08/02/2020 0947   NITRITE NEGATIVE 08/02/2020 0947   LEUKOCYTESUR NEGATIVE 08/02/2020 0947    Recent Results (from the past 240 hour(s))  Resp Panel by RT-PCR (Flu A&B, Covid) Nasopharyngeal Swab     Status: None   Collection Time: 08/01/20  8:03 PM   Specimen: Nasopharyngeal Swab; Nasopharyngeal(NP) swabs in vial transport medium  Result Value Ref Range Status   SARS Coronavirus 2 by RT PCR NEGATIVE NEGATIVE Final    Comment: (NOTE) SARS-CoV-2 target nucleic acids are NOT DETECTED.  The SARS-CoV-2 RNA is generally detectable in upper respiratory specimens during the acute phase of infection. The lowest concentration of SARS-CoV-2 viral copies this assay can detect is 138 copies/mL. A negative result does not preclude SARS-Cov-2 infection and should not be used as the sole basis for treatment or other patient management decisions. A negative result may occur with   improper specimen collection/handling, submission of specimen other than nasopharyngeal swab, presence of viral mutation(s) within the areas targeted by  this assay, and inadequate number of viral copies(<138 copies/mL). A negative result must be combined with clinical observations, patient history, and epidemiological information. The expected result is Negative.  Fact Sheet for Patients:  EntrepreneurPulse.com.au  Fact Sheet for Healthcare Providers:  IncredibleEmployment.be  This test is no t yet approved or cleared by the [place] FDA and  has been authorized for detection and/or diagnosis of SARS-CoV-2 by FDA under an Emergency Use Authorization (EUA). This EUA will remain  in effect (meaning this test can be used) for the duration of the COVID-19 declaration under Section 564(b)(1) of the Act, 21 U.S.C.section 360bbb-3(b)(1), unless the authorization is terminated  or revoked sooner.       Influenza A by PCR NEGATIVE NEGATIVE Final   Influenza B by PCR NEGATIVE NEGATIVE Final    Comment: (NOTE) The Xpert Xpress SARS-CoV-2/FLU/RSV plus assay is intended as an aid in the diagnosis of influenza from Nasopharyngeal swab specimens and should not be used as a sole basis for treatment. Nasal washings and aspirates are unacceptable for Xpert Xpress SARS-CoV-2/FLU/RSV testing.  Fact Sheet for Patients: EntrepreneurPulse.com.au  Fact Sheet for Healthcare Providers: IncredibleEmployment.be  This test is not yet approved or cleared by the [place] FDA and has been authorized for detection and/or diagnosis of SARS-CoV-2 by FDA under an Emergency Use Authorization (EUA). This EUA will remain in effect (meaning this test can be used) for the duration of the COVID-19 declaration under Section 564(b)(1) of the Act, 21 U.S.C. section 360bbb-3(b)(1), unless the authorization is terminated  or revoked.  Performed at [facility], [address], [place], [place] [address]   Urine Culture     Status: Abnormal   Collection Time: 08/02/20  9:47 AM   Specimen: Urine, Clean Catch  Result Value Ref Range Status   Specimen Description   Final    URINE, CLEAN CATCH Performed at [facility], [address], [place], [place] [address]    Special Requests   Final    Normal Performed at [facility], [address], [place], [place] [address]    Culture 90,000 COLONIES/mL ESCHERICHIA COLI (A)  Final   Report Status 08/05/2020 FINAL  Final   Organism ID, Bacteria ESCHERICHIA COLI (A)  Final      Susceptibility   Escherichia coli - MIC*    AMPICILLIN 4 SENSITIVE Sensitive     CEFAZOLIN <=4 SENSITIVE Sensitive     CEFEPIME <=0.12 SENSITIVE Sensitive     CEFTRIAXONE <=0.25 SENSITIVE Sensitive     CIPROFLOXACIN >=4 RESISTANT Resistant     GENTAMICIN <=1 SENSITIVE Sensitive     IMIPENEM <=0.25 SENSITIVE Sensitive     NITROFURANTOIN <=16 SENSITIVE Sensitive     TRIMETH/SULFA <=20 SENSITIVE Sensitive     AMPICILLIN/SULBACTAM <=2 SENSITIVE Sensitive     PIP/TAZO <=4 SENSITIVE Sensitive     * 90,000 COLONIES/mL ESCHERICHIA COLI  Aerobic/Anaerobic Culture w Gram Stain (surgical/deep wound)     Status: None (Preliminary result)   Collection Time: 08/03/20  2:29 PM   Specimen: Tissue  Result Value Ref Range Status   Specimen Description TISSUE  Final   Special Requests BIOFILM ABDOMEN  Final   Gram Stain   Final    ABUNDANT WBC PRESENT, PREDOMINANTLY PMN FEW GRAM VARIABLE ROD RARE GRAM POSITIVE COCCI    Culture   Final    MODERATE ESCHERICHIA COLI HOLDING FOR POSSIBLE ANAEROBE Performed at [facility], [address] [place], [place] [address]    Report Status PENDING  Incomplete   Organism ID, Bacteria ESCHERICHIA COLI  Final      Susceptibility   Escherichia coli - MIC*    AMPICILLIN 16 INTERMEDIATE Intermediate     CEFAZOLIN <=4 SENSITIVE Sensitive     CEFEPIME  <=0.12 SENSITIVE Sensitive     CEFTAZIDIME <=1 SENSITIVE Sensitive     CEFTRIAXONE <=0.25 SENSITIVE Sensitive     CIPROFLOXACIN >=4 RESISTANT Resistant     GENTAMICIN <=1 SENSITIVE Sensitive     IMIPENEM <=0.25 SENSITIVE Sensitive     TRIMETH/SULFA >=320 RESISTANT Resistant     AMPICILLIN/SULBACTAM <=2 SENSITIVE Sensitive     PIP/TAZO <=4 SENSITIVE Sensitive     * MODERATE ESCHERICHIA COLI    Radiology Studies: No results found. Scheduled Meds:  amiodarone  200 mg Oral Daily   amLODipine  2.5 mg Oral Daily   bisacodyl  10 mg Rectal BID   carvedilol  25 mg Oral BID WC   furosemide  40 mg Intravenous Q12H   hydrALAZINE  100 mg Oral TID   isosorbide mononitrate  120 mg Oral QHS   pantoprazole  40 mg Oral Q1200   rosuvastatin  2.5 mg Oral Q48H   Continuous Infusions:  cefTRIAXone (ROCEPHIN)  IV 2 g (08/06/20 1400)   lactated ringers 50 mL/hr at 08/05/20 1016   metronidazole 500 mg (08/07/20 0528)     LOS: 5 days   Irwin Brakeman, MD [facility] How to contact the [facility] Attending or Consulting provider [place] or covering provider during after hours [place], for this patient?  Check the care team in [facility] and look for a) attending/consulting TRH provider listed and b) the [facility] team listed Log into [email or web address] and use [place]'s universal password to access. If you do not have the password, please contact the hospital operator. Locate the [facility] provider you are looking for under [facility] and page to a number that you can be directly reached. If you still have difficulty reaching the provider, please page the [facility] (Director on Call) for the Hospitalists listed on amion for assistance.  [email or web address] 08/07/2020, 12:15 PM

## 2020-08-08 LAB — BASIC METABOLIC PANEL
Anion gap: 7 (ref 5–15)
BUN: 43 mg/dL — ABNORMAL HIGH (ref 8–23)
CO2: 19 mmol/L — ABNORMAL LOW (ref 22–32)
Calcium: 7.8 mg/dL — ABNORMAL LOW (ref 8.9–10.3)
Chloride: 105 mmol/L (ref 98–111)
Creatinine, Ser: 1.98 mg/dL — ABNORMAL HIGH (ref 0.44–1.00)
GFR, Estimated: 25 mL/min — ABNORMAL LOW (ref >60)
Glucose, Bld: 106 mg/dL — ABNORMAL HIGH (ref 70–99)
Potassium: 3.8 mmol/L (ref 3.5–5.1)
Sodium: 131 mmol/L — ABNORMAL LOW (ref 135–145)

## 2020-08-08 LAB — AEROBIC/ANAEROBIC CULTURE W GRAM STAIN (SURGICAL/DEEP WOUND)

## 2020-08-08 LAB — CBC WITH DIFFERENTIAL/PLATELET
Abs Immature Granulocytes: 1.05 10*3/uL — ABNORMAL HIGH (ref 0.00–0.07)
Basophils Absolute: 0.1 10*3/uL (ref 0.0–0.1)
Basophils Relative: 1 %
Eosinophils Absolute: 0.2 10*3/uL (ref 0.0–0.5)
Eosinophils Relative: 1 %
HCT: 25.6 % — ABNORMAL LOW (ref 36.0–46.0)
Hemoglobin: 8.3 g/dL — ABNORMAL LOW (ref 12.0–15.0)
Immature Granulocytes: 6 %
Lymphocytes Relative: 6 %
Lymphs Abs: 1 10*3/uL (ref 0.7–4.0)
MCH: 27.4 pg (ref 26.0–34.0)
MCHC: 32.4 g/dL (ref 30.0–36.0)
MCV: 84.5 fL (ref 80.0–100.0)
Monocytes Absolute: 1.2 10*3/uL — ABNORMAL HIGH (ref 0.1–1.0)
Monocytes Relative: 7 %
Neutro Abs: 13.1 10*3/uL — ABNORMAL HIGH (ref 1.7–7.7)
Neutrophils Relative %: 79 %
Platelets: 283 10*3/uL (ref 150–400)
RBC: 3.03 MIL/uL — ABNORMAL LOW (ref 3.87–5.11)
RDW: 17.3 % — ABNORMAL HIGH (ref 11.5–15.5)
WBC: 16.6 10*3/uL — ABNORMAL HIGH (ref 4.0–10.5)
nRBC: 0 % (ref 0.0–0.2)

## 2020-08-08 LAB — GLUCOSE, CAPILLARY
Glucose-Capillary: 104 mg/dL — ABNORMAL HIGH (ref 70–99)
Glucose-Capillary: 118 mg/dL — ABNORMAL HIGH (ref 70–99)
Glucose-Capillary: 152 mg/dL — ABNORMAL HIGH (ref 70–99)

## 2020-08-08 LAB — MAGNESIUM: Magnesium: 1.6 mg/dL — ABNORMAL LOW (ref 1.7–2.4)

## 2020-08-08 MED ORDER — LOPERAMIDE HCL 2 MG PO CAPS
2.0000 mg | ORAL_CAPSULE | Freq: Two times a day (BID) | ORAL | Status: DC
  Administered 2020-08-08 – 2020-08-10 (×5): 2 mg via ORAL
  Filled 2020-08-08 (×5): qty 1

## 2020-08-08 MED ORDER — MAGNESIUM SULFATE 2 GM/50ML IV SOLN
2.0000 g | Freq: Once | INTRAVENOUS | Status: AC
  Administered 2020-08-08: 2 g via INTRAVENOUS
  Filled 2020-08-08: qty 50

## 2020-08-08 NOTE — TOC Initial Note (Addendum)
Transition of Care [facility]) - Initial/Assessment Note    Patient Details  Name: Crystal [name] MRN: [id number] Date of Birth: Jun 16, 1934  [facility] of [facility] CM/SW Contact:    Boneta Lucks, RN Phone Number: 08/08/2020, 2:48 PM  Clinical Narrative:   Patient admitted with ileus. Patient has a high risk for readmission. She lives alone, she realizes she is weak and will need assistance. She has a cane, walker and other equipment needed. She has made arrangements. To go and stay with her nephew at [facility]. In [place], [place]. Where she will have 24/7 care. Patient is agreeable to home health. Linda with [facility] accepted the referral for HHPT/RN.  TOC to follow for orders.                           Expected Discharge Plan: [place] Barriers to Discharge: Continued Medical Work up   Patient Goals and CMS Choice Patient states their goals for this hospitalization and ongoing recovery are:: to go to her nephews house. CMS Medicare.gov Compare Post Acute Care list provided to:: Patient    Expected Discharge Plan and Services Expected Discharge Plan: [place]      Living arrangements for the past 2 months: [place] Arranged: RN, PT [facility] Agency: [place] ([place]) Date [place]: 08/08/20 Time [place]: [place] Representative spoke with at [place]: [place] Arrangements/Services Living arrangements for the past 2 months: [place] with:: Self Patient language and need for interpreter reviewed:: Yes        Need for Family Participation in Patient Care: Yes (Comment) Care giver support system in place?: Yes (comment) Current home services: DME Criminal Activity/Legal Involvement Pertinent to Current Situation/Hospitalization: No - Comment as needed  Activities of Daily Living Home Assistive Devices/Equipment: None ADL Screening  (condition at time of admission) Patient's cognitive ability adequate to safely complete daily activities?: Yes Is the patient deaf or have difficulty hearing?: No Does the patient have difficulty seeing, even when wearing glasses/contacts?: No Does the patient have difficulty concentrating, remembering, or making decisions?: No Patient able to express need for assistance with ADLs?: Yes Does the patient have difficulty dressing or bathing?: No Independently performs ADLs?: Yes (appropriate for developmental age) Does the patient have difficulty walking or climbing stairs?: No Weakness of Legs: None Weakness of Arms/Hands: None  Permission Sought/Granted   Emotional Assessment     Affect (typically observed): Accepting, Pleasant Orientation: : Oriented to Self, Oriented to Place, Oriented to  Time, Oriented to Situation Alcohol / Substance Use: Not Applicable Psych Involvement: No (comment)  Admission diagnosis:  Ileus ([place]) [K56.7] Other ascites [R18.8] Acute kidney injury ([place]) [N17.9] Patient Active Problem List   Diagnosis Date Noted   Hypokalemia    Hyponatremia 08/01/2020   Ileus ([place])    Melena 07/12/2020   Iron deficiency anemia 07/12/2020   Constipation 07/12/2020   Pleural effusion    Acute kidney injury ([place])    Shortness of breath    Flash pulmonary edema (HCC)    Hypertensive urgency    AF (paroxysmal atrial fibrillation) (HCC)    Statin myopathy 02/02/2020   Nonspecific abnormal electrocardiogram (ECG) (EKG)    Dysphagia 09/21/2019   Anemia 09/21/2019   Diarrhea 07/06/2019   Acute on chronic combined systolic and diastolic CHF (congestive heart failure) ([place]) 02/28/2019  Acute respiratory failure with hypoxemia (HCC) 02/28/2019   Acute pulmonary edema (HCC)    Hyperlipidemia LDL goal <70    Acute renal failure superimposed on chronic kidney disease ([place])    CHF exacerbation ([place]) 02/27/2019   CAD (coronary artery disease) 02/27/2019   STEMI (ST  elevation myocardial infarction) ([place]) 01/30/2019   NSTEMI (non-ST elevated myocardial infarction) ([place]) 01/30/2019   Non-ST elevation (NSTEMI) myocardial infarction [facility])    Renal artery stenosis ([place]) 12/02/2017   Type 2 diabetes mellitus ([place]) 03/04/2011   Hyperlipidemia 03/04/2011   Resistant hypertension 03/04/2011   PCP:  Glenda Chroman, MD Pharmacy:   [facility] [place] [address] Phone: [phone] Fax: [phone]   Readmission Risk Interventions Readmission Risk Prevention Plan 08/08/2020  Transportation Screening Complete  Medication Review (RN Care Manager) Complete  HRI or [place] Complete  SW Recovery Care/Counseling Consult Complete  Palliative Care Screening Not [place] Not Applicable  Some recent data might be hidden

## 2020-08-08 NOTE — Progress Notes (Signed)
5 Days Post-Op  Subjective: Patient is having intermittent abdominal cramping.  She is complaining about multiple liquidy bowel movements.  Her appetite is decreased.  She denies any nausea or vomiting.  Objective: Vital signs in last 24 hours: Temp:  [97.8 F (36.6 C)-98 F (36.7 C)] 98 F (36.7 C) (07/13 0407) Pulse Rate:  [56-61] 61 (07/13 0407) Resp:  [19] 19 (07/13 0407) BP: (127-144)/(40-91) 129/91 (07/13 0811) SpO2:  [96 %-98 %] 96 % (07/13 0407) Last BM Date: 08/08/20  Intake/Output from previous day: 07/12 0701 - 07/13 0700 In: 1009.5 [P.O.:720; I.V.:20; IV Piggyback:269.5] Out: 653 [Urine:651; Stool:2] Intake/Output this shift: Total I/O In: 360 [P.O.:360] Out: -   General appearance: alert, cooperative, and no distress GI: Soft, mildly distended but soft.  Bowel sounds present.  Incisions healing well.  Lab Results:  Recent Labs    08/07/20 0106 08/08/20 0451  WBC 16.7* 16.6*  HGB 8.2* 8.3*  HCT 24.7* 25.6*  PLT 219 283   BMET Recent Labs    08/07/20 0106 08/08/20 0451  NA 132* 131*  K 3.6 3.8  CL 109 105  CO2 18* 19*  GLUCOSE 112* 106*  BUN 47* 43*  CREATININE 1.99* 1.98*  CALCIUM 7.7* 7.8*   PT/INR No results for input(s): LABPROT, INR in the last 72 hours.  Studies/Results: CT ABDOMEN PELVIS WO CONTRAST  Result Date: 08/07/2020 CLINICAL DATA:  86 year old female status post appendectomy. Evaluate for abscess. EXAM: CT ABDOMEN AND PELVIS WITHOUT CONTRAST TECHNIQUE: Multidetector CT imaging of the abdomen and pelvis was performed following the standard protocol without IV contrast. COMPARISON:  CT abdomen pelvis dated 08/01/2020. FINDINGS: Evaluation of this exam is limited in the absence of intravenous contrast. Lower chest: Partially visualized bilateral pleural effusions, right greater left. There is associated partial compressive atelectasis of the lower lobes versus pneumonia. Clinical correlation is recommended. There is 3 vessel coronary  vascular calcification. There is hypoattenuation of the cardiac blood pool suggestive of anemia. Clinical correlation is recommended. Small upper abdominal pneumoperitoneum, likely related to recent appendectomy. There is diffuse mesenteric edema and small ascites. Hepatobiliary: The liver is unremarkable. No intrahepatic biliary dilatation. Gallstone. No 80 S of acute cholecystitis by CT. Pancreas: Unremarkable. No pancreatic ductal dilatation or surrounding inflammatory changes. Spleen: Normal in size without focal abnormality. Adrenals/Urinary Tract: The adrenal glands are unremarkable. There is no hydronephrosis or nephrolithiasis on either side. The visualized ureters and urinary bladder appear unremarkable. Air within the bladder likely introduced via recent instrumentation. Stomach/Bowel: There is sigmoid diverticulosis without active inflammatory changes. Diffuse thickened appearance of the distal colon, likely related to underdistention and ascites. Mild colitis is less likely but not excluded clinical correlation is recommended. Multiple dilated small bowel loops measuring up to 3.8 cm. The distal small bowel and terminal ileum are collapsed. Overall slight interval worsening of the dilatation of small bowel compared to prior CT. Small amount of contrast noted within the colon. Findings may represent worsening ileus or obstruction. Follow-up with small-bowel series recommended. Appendectomy. There is a 6.8 x 3.5 cm fluid in the right lower quadrant which has increased in size since the prior CT. It is indeterminate whether this collection is loculated or infected on this noncontrast CT. Vascular/Lymphatic: Advanced aortoiliac atherosclerotic disease. The IVC is unremarkable. No portal venous gas. There is no adenopathy. Reproductive: The uterus is anteverted. Several small calcified fibroids noted. No adnexal masses. Other: Diffuse mesenteric and subcutaneous edema and anasarca. Musculoskeletal: Osteopenia  with degenerative changes of the spine. No acute  osseous pathology. IMPRESSION: 1. Status post appendectomy. Interval increase in the size of the right lower quadrant fluid collection compared to the prior CT. It is indeterminate whether this collection is loculated or infected on this noncontrast CT. 2. Small upper abdominal pneumoperitoneum, likely related to recent appendectomy. 3. Progression of small bowel dilatation which may represent worsening ileus or obstruction. Follow-up with small-bowel series recommended. 4. Sigmoid diverticulosis. 5. Cholelithiasis. 6. Partially visualized bilateral pleural effusions, right greater left with associated partial compressive atelectasis of the lower lobes versus pneumonia. Clinical correlation is recommended. 7. Aortic Atherosclerosis (ICD10-I70.0). Electronically Signed   By: Anner Crete M.D.   On: 08/07/2020 20:18   DG CHEST PORT 1 VIEW  Result Date: 08/07/2020 CLINICAL DATA:  Shortness of breath. EXAM: PORTABLE CHEST 1 VIEW COMPARISON:  Single-view of the chest 08/03/2020 and 02/28/2020. FINDINGS: NG tube seen on the prior study has been removed. The lungs are clear. Heart size is enlarged. Trace left pleural effusion noted. Right pleural effusion seen on CT today is not visible on this film. No pneumothorax. Coronary artery stent and surgical clips in the right axilla are identified. IMPRESSION: Trace left pleural effusion. Right pleural effusion seen on CT today is not visible on this exam. Lungs are clear. Cardiomegaly without evidence of pulmonary edema. Aortic Atherosclerosis (ICD10-I70.0). Electronically Signed   By: Inge Rise M.D.   On: 08/07/2020 14:41    Anti-infectives: Anti-infectives (From admission, onward)    Start     Dose/Rate Route Frequency Ordered Stop   08/06/20 1200  cefTRIAXone (ROCEPHIN) 2 g in sodium chloride 0.9 % 100 mL IVPB        2 g 200 mL/hr over 30 Minutes Intravenous Every 24 hours 08/06/20 1112     08/04/20  1000  ciprofloxacin (CIPRO) IVPB 400 mg  Status:  Discontinued        400 mg 200 mL/hr over 60 Minutes Intravenous Every 24 hours 08/03/20 1044 08/06/20 1112   08/02/20 1100  ciprofloxacin (CIPRO) IVPB 400 mg  Status:  Discontinued        400 mg 200 mL/hr over 60 Minutes Intravenous Every 12 hours 08/01/20 2219 08/03/20 1044   08/02/20 0600  metroNIDAZOLE (FLAGYL) IVPB 500 mg        500 mg 100 mL/hr over 60 Minutes Intravenous Every 8 hours 08/01/20 2219     08/01/20 2145  ciprofloxacin (CIPRO) IVPB 400 mg        400 mg 200 mL/hr over 60 Minutes Intravenous  Once 08/01/20 2139 08/01/20 2337   08/01/20 2145  metroNIDAZOLE (FLAGYL) IVPB 500 mg        500 mg 100 mL/hr over 60 Minutes Intravenous  Once 08/01/20 2139 08/01/20 2324       Assessment/Plan: s/p Procedure(s): APPENDECTOMY LAPAROSCOPIC Impression: Postoperative day 5, status post laparoscopic appendectomy.  Repeat CT scan of the abdomen yesterday does not show any significant fluid collection that appears to be an abscess.  She has a persistently elevated leukocytosis, but is not worsening.  Hopefully if she starts having a decrease in her leukocytosis, she could be discharged in the next 24 to 48 hours.  She has been restarted on her Eliquis.  We will check white blood cell count in a.m.  LOS: 6 days    Aviva Signs 08/08/2020

## 2020-08-08 NOTE — Progress Notes (Signed)
Is under PROGRESS NOTE    Crystal [name]  ATF:[id number] DOB: July 11, 1934 DOA: 08/01/2020 PCP: Glenda Chroman, MD   Brief Narrative:   Crystal [name] 86 y.o. female with medical history significant of iron deficiency anemia, Bell's palsy, history of breast cancer and right mastectomy, CAD, history of STEMI, chronic systolic heart failure, paroxysmal atrial fibrillation, stage IV chronic kidney disease, essential hypertension, GERD, gout, history of squamous cell carcinoma of the skin, mixed hyperlipidemia, osteopenia, thyroid nodule, type 2 diabetes mellitus who came to the emergency department due to abdominal pain, bloating with frequent dry heaving for the past 3 days.  Patient was admitted with ileus in the setting of appendicitis and noted electrolyte abnormalities.  She had undergone appendectomy on 08/03/2020.  She currently continues to struggle with some diarrhea and lower extremity edema.  She also has poor oral intake.  Assessment & Plan:   Principal Problem:   Ileus ([place]) Active Problems:   Type 2 diabetes mellitus (HCC)   Hyperlipidemia   CAD (coronary artery disease)   Hyperlipidemia LDL goal <70   AF (paroxysmal atrial fibrillation) (HCC)   Acute kidney injury ([place])   Iron deficiency anemia   Hyponatremia   Hypokalemia   Ileus in the setting of appendicitis-resolved -Noted to have prior electrolyte abnormalities which are being corrected -CT scan appears stable -Continues to have leukocytosis for which antibiotics are ongoing -Patient has been resumed on Eliquis -Appears to have poor appetite and therefore difficult to assess whether she is tolerating diet. -Appreciate ongoing general surgery recommendations  Diarrhea -Started on Imodium 7/13  Hyponatremia -Noted to be hypervolemic -Holding torsemide today given poor oral intake and ongoing diarrhea -May resume in the next 1-2 days as she clinically improves  CAD/hypertension/chronic diastolic heart  failure/PAF -Prior DES to mid LAD 1/21 -Started back on Eliquis -Blood pressures are currently better controlled on current medications -Holding torsemide today given poor oral intake  Hypomagnesemia -Replete and reevaluate in a.m.  CKD stage IV -Currently with stable trend, follow BMP in a.m.  Dyslipidemia -Resume home medications by discharge  Iron deficiency anemia -Holding iron tablets until discharge  History of breast cancer status post right-sided mastectomy   DVT prophylaxis: Eliquis Code Status: Full Family Communication: None at bedside Disposition Plan:  Status is: Inpatient  Remains inpatient appropriate because:IV treatments appropriate due to intensity of illness or inability to take PO and Inpatient level of care appropriate due to severity of illness  Dispo: The patient is from: Home              Anticipated d/c is to: Home              Patient currently is not medically stable to d/c.   Difficult to place patient No   Consultants:  General Surgery  Procedures:  Appendectomy 7/8  Antimicrobials:  Anti-infectives (From admission, onward)    Start     Dose/Rate Route Frequency Ordered Stop   08/06/20 1200  cefTRIAXone (ROCEPHIN) 2 g in sodium chloride 0.9 % 100 mL IVPB        2 g 200 mL/hr over 30 Minutes Intravenous Every 24 hours 08/06/20 1112     08/04/20 1000  ciprofloxacin (CIPRO) IVPB 400 mg  Status:  Discontinued        400 mg 200 mL/hr over 60 Minutes Intravenous Every 24 hours 08/03/20 1044 08/06/20 1112   08/02/20 1100  ciprofloxacin (CIPRO) IVPB 400 mg  Status:  Discontinued  400 mg 200 mL/hr over 60 Minutes Intravenous Every 12 hours 08/01/20 2219 08/03/20 1044   08/02/20 0600  metroNIDAZOLE (FLAGYL) IVPB 500 mg        500 mg 100 mL/hr over 60 Minutes Intravenous Every 8 hours 08/01/20 2219     08/01/20 2145  ciprofloxacin (CIPRO) IVPB 400 mg        400 mg 200 mL/hr over 60 Minutes Intravenous  Once 08/01/20 2139 08/01/20 2337    08/01/20 2145  metroNIDAZOLE (FLAGYL) IVPB 500 mg        500 mg 100 mL/hr over 60 Minutes Intravenous  Once 08/01/20 2139 08/01/20 2324       Subjective: Patient seen and evaluated today with ongoing diarrhea as well as lower extremity edema and poor appetite.  She continues to have ongoing abdominal cramping symptoms.  Objective: Vitals:   08/07/20 1800 08/07/20 2007 08/08/20 0407 08/08/20 0811  BP: (!) 127/47 (!) 144/50 (!) 137/40 (!) 129/91  Pulse:  (!) 57 61   Resp:  19 19   Temp:  98 F (36.7 C) 98 F (36.7 C)   TempSrc:  Oral    SpO2:  98% 96%   Weight:      Height:        Intake/Output Summary (Last 24 hours) at 08/08/2020 1143 Last data filed at 08/08/2020 0900 Gross per 24 hour  Intake 889.46 ml  Output 653 ml  Net 236.46 ml   Filed Weights   08/01/20 1546  Weight: 56.2 kg    Examination:  General exam: Appears calm and comfortable  Respiratory system: Clear to auscultation. Respiratory effort normal. Cardiovascular system: S1 & S2 heard, RRR.  Gastrointestinal system: Abdomen is soft, incisions clean dry and intact Central nervous system: Alert and awake Extremities: 2+ edema to bilateral lower extremities Skin: No significant lesions noted Psychiatry: Flat affect.    Data Reviewed: I have personally reviewed following labs and imaging studies  CBC: Recent Labs  Lab 08/01/20 1508 08/02/20 0440 08/04/20 0432 08/05/20 0552 08/06/20 0426 08/07/20 0106 08/08/20 0451  WBC 8.5   < > 8.5 13.6* 16.4* 16.7* 16.6*  NEUTROABS 7.7  --   --   --   --   --  13.1*  HGB 11.1*   < > 9.2* 9.6* 9.2* 8.2* 8.3*  HCT 33.6*   < > 27.8* 30.1* 28.8* 24.7* 25.6*  MCV 85.7   < > 84.0 87.5 86.5 85.8 84.5  PLT 170   < > 173 218 235 219 283   < > = values in this interval not displayed.   Basic Metabolic Panel: Recent Labs  Lab 08/02/20 0440 08/03/20 0358 08/04/20 0432 08/05/20 0552 08/06/20 0426 08/07/20 0106 08/08/20 0451  NA 128*   < > 135 136 137 132*  131*  K 3.4*   < > 4.0 3.9 4.1 3.6 3.8  CL 96*   < > 109 111 110 109 105  CO2 19*   < > 20* 20* 20* 18* 19*  GLUCOSE 74   < > 169* 123* 107* 112* 106*  BUN 66*   < > 56* 53* 47* 47* 43*  CREATININE 3.36*   < > 2.34* 2.13* 1.87* 1.99* 1.98*  CALCIUM 8.4*   < > 8.4* 8.6* 8.5* 7.7* 7.8*  MG 1.7  --   --   --   --   --  1.6*   < > = values in this interval not displayed.   GFR: Estimated Creatinine Clearance: 18.6  mL/min (A) (by C-G formula based on SCr of 1.98 mg/dL (H)). Liver Function Tests: Recent Labs  Lab 08/01/20 1508 08/02/20 0440  AST 50* 30  ALT 43 30  ALKPHOS 66 49  BILITOT 1.2 1.1  PROT 6.7 5.4*  ALBUMIN 3.6 2.8*   Recent Labs  Lab 08/01/20 1508  LIPASE 21   No results for input(s): AMMONIA in the last 168 hours. Coagulation Profile: No results for input(s): INR, PROTIME in the last 168 hours. Cardiac Enzymes: No results for input(s): CKTOTAL, CKMB, CKMBINDEX, TROPONINI in the last 168 hours. BNP (last 3 results) No results for input(s): PROBNP in the last 8760 hours. HbA1C: No results for input(s): HGBA1C in the last 72 hours. CBG: Recent Labs  Lab 08/07/20 1111 08/07/20 1628 08/07/20 2009 08/08/20 0742 08/08/20 1107  GLUCAP 137* 125* 147* 104* 118*   Lipid Profile: No results for input(s): CHOL, HDL, LDLCALC, TRIG, CHOLHDL, LDLDIRECT in the last 72 hours. Thyroid Function Tests: No results for input(s): TSH, T4TOTAL, FREET4, T3FREE, THYROIDAB in the last 72 hours. Anemia Panel: No results for input(s): VITAMINB12, FOLATE, FERRITIN, TIBC, IRON, RETICCTPCT in the last 72 hours. Sepsis Labs: No results for input(s): PROCALCITON, LATICACIDVEN in the last 168 hours.  Recent Results (from the past 240 hour(s))  Resp Panel by RT-PCR (Flu A&B, Covid) Nasopharyngeal Swab     Status: None   Collection Time: 08/01/20  8:03 PM   Specimen: Nasopharyngeal Swab; Nasopharyngeal(NP) swabs in vial transport medium  Result Value Ref Range Status   SARS  Coronavirus 2 by RT PCR NEGATIVE NEGATIVE Final    Comment: (NOTE) SARS-CoV-2 target nucleic acids are NOT DETECTED.  The SARS-CoV-2 RNA is generally detectable in upper respiratory specimens during the acute phase of infection. The lowest concentration of SARS-CoV-2 viral copies this assay can detect is 138 copies/mL. A negative result does not preclude SARS-Cov-2 infection and should not be used as the sole basis for treatment or other patient management decisions. A negative result may occur with  improper specimen collection/handling, submission of specimen other than nasopharyngeal swab, presence of viral mutation(s) within the areas targeted by this assay, and inadequate number of viral copies(<138 copies/mL). A negative result must be combined with clinical observations, patient history, and epidemiological information. The expected result is Negative.  Fact Sheet for Patients:  EntrepreneurPulse.com.au  Fact Sheet for Healthcare Providers:  IncredibleEmployment.be  This test is no t yet approved or cleared by the [place] FDA and  has been authorized for detection and/or diagnosis of SARS-CoV-2 by FDA under an Emergency Use Authorization (EUA). This EUA will remain  in effect (meaning this test can be used) for the duration of the COVID-19 declaration under Section 564(b)(1) of the Act, 21 U.S.C.section 360bbb-3(b)(1), unless the authorization is terminated  or revoked sooner.       Influenza A by PCR NEGATIVE NEGATIVE Final   Influenza B by PCR NEGATIVE NEGATIVE Final    Comment: (NOTE) The Xpert Xpress SARS-CoV-2/FLU/RSV plus assay is intended as an aid in the diagnosis of influenza from Nasopharyngeal swab specimens and should not be used as a sole basis for treatment. Nasal washings and aspirates are unacceptable for Xpert Xpress SARS-CoV-2/FLU/RSV testing.  Fact Sheet for  Patients: EntrepreneurPulse.com.au  Fact Sheet for Healthcare Providers: IncredibleEmployment.be  This test is not yet approved or cleared by the [place] FDA and has been authorized for detection and/or diagnosis of SARS-CoV-2 by FDA under an Emergency Use Authorization (EUA). This EUA will remain in  effect (meaning this test can be used) for the duration of the COVID-19 declaration under Section 564(b)(1) of the Act, 21 U.S.C. section 360bbb-3(b)(1), unless the authorization is terminated or revoked.  Performed at [facility], [address], [place], [place] [address]   Urine Culture     Status: Abnormal   Collection Time: 08/02/20  9:47 AM   Specimen: Urine, Clean Catch  Result Value Ref Range Status   Specimen Description   Final    URINE, CLEAN CATCH Performed at [facility], [address], [place], [place] [address]    Special Requests   Final    Normal Performed at [facility], [address], [place], [place] [address]    Culture 90,000 COLONIES/mL ESCHERICHIA COLI (A)  Final   Report Status 08/05/2020 FINAL  Final   Organism ID, Bacteria ESCHERICHIA COLI (A)  Final      Susceptibility   Escherichia coli - MIC*    AMPICILLIN 4 SENSITIVE Sensitive     CEFAZOLIN <=4 SENSITIVE Sensitive     CEFEPIME <=0.12 SENSITIVE Sensitive     CEFTRIAXONE <=0.25 SENSITIVE Sensitive     CIPROFLOXACIN >=4 RESISTANT Resistant     GENTAMICIN <=1 SENSITIVE Sensitive     IMIPENEM <=0.25 SENSITIVE Sensitive     NITROFURANTOIN <=16 SENSITIVE Sensitive     TRIMETH/SULFA <=20 SENSITIVE Sensitive     AMPICILLIN/SULBACTAM <=2 SENSITIVE Sensitive     PIP/TAZO <=4 SENSITIVE Sensitive     * 90,000 COLONIES/mL ESCHERICHIA COLI  Aerobic/Anaerobic Culture w Gram Stain (surgical/deep wound)     Status: None   Collection Time: 08/03/20  2:29 PM   Specimen: Tissue  Result Value Ref Range Status   Specimen Description TISSUE  Final   Special Requests  BIOFILM ABDOMEN  Final   Gram Stain   Final    ABUNDANT WBC PRESENT, PREDOMINANTLY PMN FEW GRAM VARIABLE ROD RARE GRAM POSITIVE COCCI    Culture   Final    MODERATE ESCHERICHIA COLI FEW BACTEROIDES THETAIOTAOMICRON BETA LACTAMASE POSITIVE Performed at [facility], [place] [address], [place], [place] [address]    Report Status 08/08/2020 FINAL  Final   Organism ID, Bacteria ESCHERICHIA COLI  Final      Susceptibility   Escherichia coli - MIC*    AMPICILLIN 16 INTERMEDIATE Intermediate     CEFAZOLIN <=4 SENSITIVE Sensitive     CEFEPIME <=0.12 SENSITIVE Sensitive     CEFTAZIDIME <=1 SENSITIVE Sensitive     CEFTRIAXONE <=0.25 SENSITIVE Sensitive     CIPROFLOXACIN >=4 RESISTANT Resistant     GENTAMICIN <=1 SENSITIVE Sensitive     IMIPENEM <=0.25 SENSITIVE Sensitive     TRIMETH/SULFA >=320 RESISTANT Resistant     AMPICILLIN/SULBACTAM <=2 SENSITIVE Sensitive     PIP/TAZO <=4 SENSITIVE Sensitive     * MODERATE ESCHERICHIA COLI         Radiology Studies: CT ABDOMEN PELVIS WO CONTRAST  Result Date: 08/07/2020 CLINICAL DATA:  86 year old female status post appendectomy. Evaluate for abscess. EXAM: CT ABDOMEN AND PELVIS WITHOUT CONTRAST TECHNIQUE: Multidetector CT imaging of the abdomen and pelvis was performed following the standard protocol without IV contrast. COMPARISON:  CT abdomen pelvis dated 08/01/2020. FINDINGS: Evaluation of this exam is limited in the absence of intravenous contrast. Lower chest: Partially visualized bilateral pleural effusions, right greater left. There is associated partial compressive atelectasis of the lower lobes versus pneumonia. Clinical correlation is recommended. There is 3 vessel coronary vascular calcification. There is hypoattenuation of the cardiac blood  pool suggestive of anemia. Clinical correlation is recommended. Small upper abdominal pneumoperitoneum, likely related to recent appendectomy. There is diffuse mesenteric edema and small ascites.  Hepatobiliary: The liver is unremarkable. No intrahepatic biliary dilatation. Gallstone. No 80 S of acute cholecystitis by CT. Pancreas: Unremarkable. No pancreatic ductal dilatation or surrounding inflammatory changes. Spleen: Normal in size without focal abnormality. Adrenals/Urinary Tract: The adrenal glands are unremarkable. There is no hydronephrosis or nephrolithiasis on either side. The visualized ureters and urinary bladder appear unremarkable. Air within the bladder likely introduced via recent instrumentation. Stomach/Bowel: There is sigmoid diverticulosis without active inflammatory changes. Diffuse thickened appearance of the distal colon, likely related to underdistention and ascites. Mild colitis is less likely but not excluded clinical correlation is recommended. Multiple dilated small bowel loops measuring up to 3.8 cm. The distal small bowel and terminal ileum are collapsed. Overall slight interval worsening of the dilatation of small bowel compared to prior CT. Small amount of contrast noted within the colon. Findings may represent worsening ileus or obstruction. Follow-up with small-bowel series recommended. Appendectomy. There is a 6.8 x 3.5 cm fluid in the right lower quadrant which has increased in size since the prior CT. It is indeterminate whether this collection is loculated or infected on this noncontrast CT. Vascular/Lymphatic: Advanced aortoiliac atherosclerotic disease. The IVC is unremarkable. No portal venous gas. There is no adenopathy. Reproductive: The uterus is anteverted. Several small calcified fibroids noted. No adnexal masses. Other: Diffuse mesenteric and subcutaneous edema and anasarca. Musculoskeletal: Osteopenia with degenerative changes of the spine. No acute osseous pathology. IMPRESSION: 1. Status post appendectomy. Interval increase in the size of the right lower quadrant fluid collection compared to the prior CT. It is indeterminate whether this collection is  loculated or infected on this noncontrast CT. 2. Small upper abdominal pneumoperitoneum, likely related to recent appendectomy. 3. Progression of small bowel dilatation which may represent worsening ileus or obstruction. Follow-up with small-bowel series recommended. 4. Sigmoid diverticulosis. 5. Cholelithiasis. 6. Partially visualized bilateral pleural effusions, right greater left with associated partial compressive atelectasis of the lower lobes versus pneumonia. Clinical correlation is recommended. 7. Aortic Atherosclerosis (ICD10-I70.0). Electronically Signed   By: Anner Crete M.D.   On: 08/07/2020 20:18   DG CHEST PORT 1 VIEW  Result Date: 08/07/2020 CLINICAL DATA:  Shortness of breath. EXAM: PORTABLE CHEST 1 VIEW COMPARISON:  Single-view of the chest 08/03/2020 and 02/28/2020. FINDINGS: NG tube seen on the prior study has been removed. The lungs are clear. Heart size is enlarged. Trace left pleural effusion noted. Right pleural effusion seen on CT today is not visible on this film. No pneumothorax. Coronary artery stent and surgical clips in the right axilla are identified. IMPRESSION: Trace left pleural effusion. Right pleural effusion seen on CT today is not visible on this exam. Lungs are clear. Cardiomegaly without evidence of pulmonary edema. Aortic Atherosclerosis (ICD10-I70.0). Electronically Signed   By: Inge Rise M.D.   On: 08/07/2020 14:41        Scheduled Meds:  amiodarone  200 mg Oral Daily   amLODipine  2.5 mg Oral Daily   apixaban  2.5 mg Oral BID   carvedilol  25 mg Oral BID WC   ferrous sulfate  325 mg Oral QAC lunch   hydrALAZINE  100 mg Oral TID   isosorbide mononitrate  120 mg Oral QHS   loperamide  2 mg Oral BID   multivitamin with minerals  1 tablet Oral Daily   pantoprazole  40 mg Oral Q1200  potassium chloride  40 mEq Oral BID   rosuvastatin  2.5 mg Oral Q48H   Continuous Infusions:  cefTRIAXone (ROCEPHIN)  IV 2 g (08/08/20 1058)   metronidazole  500 mg (08/08/20 0516)     LOS: 6 days    Time spent: 35 minutes    Briunna Leicht D Manuella Ghazi, DO [facility]  If 7PM-7AM, please contact night-coverage [email or web address] 08/08/2020, 11:43 AM

## 2020-08-09 ENCOUNTER — Encounter (HOSPITAL_COMMUNITY): Payer: Medicare Other

## 2020-08-09 LAB — CBC
HCT: 26.7 % — ABNORMAL LOW (ref 36.0–46.0)
Hemoglobin: 8.9 g/dL — ABNORMAL LOW (ref 12.0–15.0)
MCH: 28.3 pg (ref 26.0–34.0)
MCHC: 33.3 g/dL (ref 30.0–36.0)
MCV: 84.8 fL (ref 80.0–100.0)
Platelets: 345 10*3/uL (ref 150–400)
RBC: 3.15 MIL/uL — ABNORMAL LOW (ref 3.87–5.11)
RDW: 17.3 % — ABNORMAL HIGH (ref 11.5–15.5)
WBC: 17.8 10*3/uL — ABNORMAL HIGH (ref 4.0–10.5)
nRBC: 0 % (ref 0.0–0.2)

## 2020-08-09 LAB — BASIC METABOLIC PANEL
Anion gap: 8 (ref 5–15)
BUN: 42 mg/dL — ABNORMAL HIGH (ref 8–23)
CO2: 19 mmol/L — ABNORMAL LOW (ref 22–32)
Calcium: 8.1 mg/dL — ABNORMAL LOW (ref 8.9–10.3)
Chloride: 106 mmol/L (ref 98–111)
Creatinine, Ser: 1.93 mg/dL — ABNORMAL HIGH (ref 0.44–1.00)
GFR, Estimated: 25 mL/min — ABNORMAL LOW (ref >60)
Glucose, Bld: 87 mg/dL (ref 70–99)
Potassium: 4.2 mmol/L (ref 3.5–5.1)
Sodium: 133 mmol/L — ABNORMAL LOW (ref 135–145)

## 2020-08-09 LAB — GLUCOSE, CAPILLARY
Glucose-Capillary: 91 mg/dL (ref 70–99)
Glucose-Capillary: 95 mg/dL (ref 70–99)
Glucose-Capillary: 158 mg/dL — ABNORMAL HIGH (ref 70–99)
Glucose-Capillary: 115 mg/dL — ABNORMAL HIGH (ref 70–99)

## 2020-08-09 LAB — MAGNESIUM: Magnesium: 1.9 mg/dL (ref 1.7–2.4)

## 2020-08-09 MED ORDER — TORSEMIDE 20 MG PO TABS
40.0000 mg | ORAL_TABLET | Freq: Every day | ORAL | Status: DC
  Administered 2020-08-09: 40 mg via ORAL
  Filled 2020-08-09 (×2): qty 2

## 2020-08-09 NOTE — Progress Notes (Signed)
PROGRESS NOTE    ANALIESE KRUPKA  BJS:[id number] DOB: 10-25-1934 DOA: 08/01/2020 PCP: Glenda Chroman, MD   Brief Narrative:   [name] 86 y.o. female with medical history significant of iron deficiency anemia, Bell's palsy, history of breast cancer and right mastectomy, CAD, history of STEMI, chronic systolic heart failure, paroxysmal atrial fibrillation, stage IV chronic kidney disease, essential hypertension, GERD, gout, history of squamous cell carcinoma of the skin, mixed hyperlipidemia, osteopenia, thyroid nodule, type 2 diabetes mellitus who came to the emergency department due to abdominal pain, bloating with frequent dry heaving for the past 3 days.  Patient was admitted with ileus in the setting of appendicitis and noted electrolyte abnormalities.  She had undergone appendectomy on 08/03/2020.  She continues to have leukocytosis.  Assessment & Plan:   Principal Problem:   Ileus ([place]) Active Problems:   Type 2 diabetes mellitus (HCC)   Hyperlipidemia   CAD (coronary artery disease)   Hyperlipidemia LDL goal <70   AF (paroxysmal atrial fibrillation) (HCC)   Acute kidney injury ([place])   Iron deficiency anemia   Hyponatremia   Hypokalemia   Ileus in the setting of appendicitis-resolved -Noted to have prior electrolyte abnormalities which are being corrected -CT scan appears stable -Continues to have leukocytosis for which further monitoring with CBC will take place in a.m. -Discontinued Rocephin on 7/13 -Appreciate ongoing general surgery recommendations -Holding Eliquis starting 7/14 in case fluid aspiration is needed   Diarrhea-improving -Started on Imodium 7/13   Hyponatremia-stable -Noted to be hypervolemic -May resume torsemide today   CAD/hypertension/chronic diastolic heart failure/PAF -Prior DES to mid LAD 1/21 -Started back on Eliquis -Blood pressures are currently better controlled on current medications -Resume torsemide 7/14   CKD stage  IV -Currently with stable trend, follow BMP in a.m.   Dyslipidemia -Resume home medications by discharge   Iron deficiency anemia-stable -Holding iron tablets until discharge   History of breast cancer status post right-sided mastectomy     DVT prophylaxis: Eliquis, currently held, will start SCDs Code Status: Full Family Communication: None at bedside Disposition Plan:  Status is: Inpatient   Remains inpatient appropriate because:IV treatments appropriate due to intensity of illness or inability to take PO and Inpatient level of care appropriate due to severity of illness   Dispo: The patient is from: Home              Anticipated d/c is to: Home              Patient currently is not medically stable to d/c.              Difficult to place patient No     Consultants:  General Surgery   Procedures:  Appendectomy 7/8  Antimicrobials:  Anti-infectives (From admission, onward)    Start     Dose/Rate Route Frequency Ordered Stop   08/06/20 1200  cefTRIAXone (ROCEPHIN) 2 g in sodium chloride 0.9 % 100 mL IVPB  Status:  Discontinued        2 g 200 mL/hr over 30 Minutes Intravenous Every 24 hours 08/06/20 1112 08/09/20 0824   08/04/20 1000  ciprofloxacin (CIPRO) IVPB 400 mg  Status:  Discontinued        400 mg 200 mL/hr over 60 Minutes Intravenous Every 24 hours 08/03/20 1044 08/06/20 1112   08/02/20 1100  ciprofloxacin (CIPRO) IVPB 400 mg  Status:  Discontinued        400 mg 200 mL/hr over 60 Minutes Intravenous Every  12 hours 08/01/20 2219 08/03/20 1044   08/02/20 0600  metroNIDAZOLE (FLAGYL) IVPB 500 mg  Status:  Discontinued        500 mg 100 mL/hr over 60 Minutes Intravenous Every 8 hours 08/01/20 2219 08/09/20 0824   08/01/20 2145  ciprofloxacin (CIPRO) IVPB 400 mg        400 mg 200 mL/hr over 60 Minutes Intravenous  Once 08/01/20 2139 08/01/20 2337   08/01/20 2145  metroNIDAZOLE (FLAGYL) IVPB 500 mg        500 mg 100 mL/hr over 60 Minutes Intravenous  Once  08/01/20 2139 08/01/20 2324       Subjective: Patient seen and evaluated today with no new acute complaints or concerns. No acute concerns or events noted overnight.  She appears to be eating better and has less diarrhea today.  She denies abdominal pain.  Objective: Vitals:   08/08/20 1947 08/08/20 2159 08/08/20 2255 08/09/20 0304  BP: (!) 152/43 (!) 149/50 (!) 164/52 (!) 147/42  Pulse: 61 66 63 70  Resp: 19   19  Temp: 98.2 F (36.8 C)   98.2 F (36.8 C)  TempSrc:      SpO2: 97%   96%  Weight:      Height:        Intake/Output Summary (Last 24 hours) at 08/09/2020 1034 Last data filed at 08/09/2020 0900 Gross per 24 hour  Intake 840 ml  Output 1800 ml  Net -960 ml   Filed Weights   08/01/20 1546  Weight: 56.2 kg    Examination:  General exam: Appears calm and comfortable  Respiratory system: Clear to auscultation. Respiratory effort normal. Cardiovascular system: S1 & S2 heard, RRR.  Gastrointestinal system: Abdomen is soft, incisions clean dry and intact Central nervous system: Alert and awake Extremities: No edema Skin: No significant lesions noted Psychiatry: Flat affect.    Data Reviewed: I have personally reviewed following labs and imaging studies  CBC: Recent Labs  Lab 08/05/20 0552 08/06/20 0426 08/07/20 0106 08/08/20 0451 08/09/20 0323  WBC 13.6* 16.4* 16.7* 16.6* 17.8*  NEUTROABS  --   --   --  13.1*  --   HGB 9.6* 9.2* 8.2* 8.3* 8.9*  HCT 30.1* 28.8* 24.7* 25.6* 26.7*  MCV 87.5 86.5 85.8 84.5 84.8  PLT 218 235 219 283 865   Basic Metabolic Panel: Recent Labs  Lab 08/05/20 0552 08/06/20 0426 08/07/20 0106 08/08/20 0451 08/09/20 0323  NA 136 137 132* 131* 133*  K 3.9 4.1 3.6 3.8 4.2  CL 111 110 109 105 106  CO2 20* 20* 18* 19* 19*  GLUCOSE 123* 107* 112* 106* 87  BUN 53* 47* 47* 43* 42*  CREATININE 2.13* 1.87* 1.99* 1.98* 1.93*  CALCIUM 8.6* 8.5* 7.7* 7.8* 8.1*  MG  --   --   --  1.6* 1.9   GFR: Estimated Creatinine  Clearance: 19.1 mL/min (A) (by C-G formula based on SCr of 1.93 mg/dL (H)). Liver Function Tests: No results for input(s): AST, ALT, ALKPHOS, BILITOT, PROT, ALBUMIN in the last 168 hours. No results for input(s): LIPASE, AMYLASE in the last 168 hours. No results for input(s): AMMONIA in the last 168 hours. Coagulation Profile: No results for input(s): INR, PROTIME in the last 168 hours. Cardiac Enzymes: No results for input(s): CKTOTAL, CKMB, CKMBINDEX, TROPONINI in the last 168 hours. BNP (last 3 results) No results for input(s): PROBNP in the last 8760 hours. HbA1C: No results for input(s): HGBA1C in the last 72 hours. CBG:  Recent Labs  Lab 08/08/20 0742 08/08/20 1107 08/08/20 1608 08/09/20 0528 08/09/20 0732  GLUCAP 104* 118* 152* 91 95   Lipid Profile: No results for input(s): CHOL, HDL, LDLCALC, TRIG, CHOLHDL, LDLDIRECT in the last 72 hours. Thyroid Function Tests: No results for input(s): TSH, T4TOTAL, FREET4, T3FREE, THYROIDAB in the last 72 hours. Anemia Panel: No results for input(s): VITAMINB12, FOLATE, FERRITIN, TIBC, IRON, RETICCTPCT in the last 72 hours. Sepsis Labs: No results for input(s): PROCALCITON, LATICACIDVEN in the last 168 hours.  Recent Results (from the past 240 hour(s))  Resp Panel by RT-PCR (Flu A&B, Covid) Nasopharyngeal Swab     Status: None   Collection Time: 08/01/20  8:03 PM   Specimen: Nasopharyngeal Swab; Nasopharyngeal(NP) swabs in vial transport medium  Result Value Ref Range Status   SARS Coronavirus 2 by RT PCR NEGATIVE NEGATIVE Final    Comment: (NOTE) SARS-CoV-2 target nucleic acids are NOT DETECTED.  The SARS-CoV-2 RNA is generally detectable in upper respiratory specimens during the acute phase of infection. The lowest concentration of SARS-CoV-2 viral copies this assay can detect is 138 copies/mL. A negative result does not preclude SARS-Cov-2 infection and should not be used as the sole basis for treatment or other patient  management decisions. A negative result may occur with  improper specimen collection/handling, submission of specimen other than nasopharyngeal swab, presence of viral mutation(s) within the areas targeted by this assay, and inadequate number of viral copies(<138 copies/mL). A negative result must be combined with clinical observations, patient history, and epidemiological information. The expected result is Negative.  Fact Sheet for Patients:  EntrepreneurPulse.com.au  Fact Sheet for Healthcare Providers:  IncredibleEmployment.be  This test is no t yet approved or cleared by the [place] FDA and  has been authorized for detection and/or diagnosis of SARS-CoV-2 by FDA under an Emergency Use Authorization (EUA). This EUA will remain  in effect (meaning this test can be used) for the duration of the COVID-19 declaration under Section 564(b)(1) of the Act, 21 U.S.C.section 360bbb-3(b)(1), unless the authorization is terminated  or revoked sooner.       Influenza A by PCR NEGATIVE NEGATIVE Final   Influenza B by PCR NEGATIVE NEGATIVE Final    Comment: (NOTE) The Xpert Xpress SARS-CoV-2/FLU/RSV plus assay is intended as an aid in the diagnosis of influenza from Nasopharyngeal swab specimens and should not be used as a sole basis for treatment. Nasal washings and aspirates are unacceptable for Xpert Xpress SARS-CoV-2/FLU/RSV testing.  Fact Sheet for Patients: EntrepreneurPulse.com.au  Fact Sheet for Healthcare Providers: IncredibleEmployment.be  This test is not yet approved or cleared by the [place] FDA and has been authorized for detection and/or diagnosis of SARS-CoV-2 by FDA under an Emergency Use Authorization (EUA). This EUA will remain in effect (meaning this test can be used) for the duration of the COVID-19 declaration under Section 564(b)(1) of the Act, 21 U.S.C. section 360bbb-3(b)(1),  unless the authorization is terminated or revoked.  Performed at [facility], [address], [place], [place] [address]   Urine Culture     Status: Abnormal   Collection Time: 08/02/20  9:47 AM   Specimen: Urine, Clean Catch  Result Value Ref Range Status   Specimen Description   Final    URINE, CLEAN CATCH Performed at [facility], [address], [place], [place] [address]    Special Requests   Final    Normal Performed at [facility], [address], [place], [place] [address]    Culture  90,000 COLONIES/mL ESCHERICHIA COLI (A)  Final   Report Status 08/05/2020 FINAL  Final   Organism ID, Bacteria ESCHERICHIA COLI (A)  Final      Susceptibility   Escherichia coli - MIC*    AMPICILLIN 4 SENSITIVE Sensitive     CEFAZOLIN <=4 SENSITIVE Sensitive     CEFEPIME <=0.12 SENSITIVE Sensitive     CEFTRIAXONE <=0.25 SENSITIVE Sensitive     CIPROFLOXACIN >=4 RESISTANT Resistant     GENTAMICIN <=1 SENSITIVE Sensitive     IMIPENEM <=0.25 SENSITIVE Sensitive     NITROFURANTOIN <=16 SENSITIVE Sensitive     TRIMETH/SULFA <=20 SENSITIVE Sensitive     AMPICILLIN/SULBACTAM <=2 SENSITIVE Sensitive     PIP/TAZO <=4 SENSITIVE Sensitive     * 90,000 COLONIES/mL ESCHERICHIA COLI  Aerobic/Anaerobic Culture w Gram Stain (surgical/deep wound)     Status: None   Collection Time: 08/03/20  2:29 PM   Specimen: Tissue  Result Value Ref Range Status   Specimen Description TISSUE  Final   Special Requests BIOFILM ABDOMEN  Final   Gram Stain   Final    ABUNDANT WBC PRESENT, PREDOMINANTLY PMN FEW GRAM VARIABLE ROD RARE GRAM POSITIVE COCCI    Culture   Final    MODERATE ESCHERICHIA COLI FEW BACTEROIDES THETAIOTAOMICRON BETA LACTAMASE POSITIVE Performed at [facility], [address] [place], [place] [address]    Report Status 08/08/2020 FINAL  Final   Organism ID, Bacteria ESCHERICHIA COLI  Final      Susceptibility   Escherichia coli - MIC*    AMPICILLIN 16 INTERMEDIATE Intermediate      CEFAZOLIN <=4 SENSITIVE Sensitive     CEFEPIME <=0.12 SENSITIVE Sensitive     CEFTAZIDIME <=1 SENSITIVE Sensitive     CEFTRIAXONE <=0.25 SENSITIVE Sensitive     CIPROFLOXACIN >=4 RESISTANT Resistant     GENTAMICIN <=1 SENSITIVE Sensitive     IMIPENEM <=0.25 SENSITIVE Sensitive     TRIMETH/SULFA >=320 RESISTANT Resistant     AMPICILLIN/SULBACTAM <=2 SENSITIVE Sensitive     PIP/TAZO <=4 SENSITIVE Sensitive     * MODERATE ESCHERICHIA COLI         Radiology Studies: CT ABDOMEN PELVIS WO CONTRAST  Result Date: 08/07/2020 CLINICAL DATA:  86 year old female status post appendectomy. Evaluate for abscess. EXAM: CT ABDOMEN AND PELVIS WITHOUT CONTRAST TECHNIQUE: Multidetector CT imaging of the abdomen and pelvis was performed following the standard protocol without IV contrast. COMPARISON:  CT abdomen pelvis dated 08/01/2020. FINDINGS: Evaluation of this exam is limited in the absence of intravenous contrast. Lower chest: Partially visualized bilateral pleural effusions, right greater left. There is associated partial compressive atelectasis of the lower lobes versus pneumonia. Clinical correlation is recommended. There is 3 vessel coronary vascular calcification. There is hypoattenuation of the cardiac blood pool suggestive of anemia. Clinical correlation is recommended. Small upper abdominal pneumoperitoneum, likely related to recent appendectomy. There is diffuse mesenteric edema and small ascites. Hepatobiliary: The liver is unremarkable. No intrahepatic biliary dilatation. Gallstone. No 80 S of acute cholecystitis by CT. Pancreas: Unremarkable. No pancreatic ductal dilatation or surrounding inflammatory changes. Spleen: Normal in size without focal abnormality. Adrenals/Urinary Tract: The adrenal glands are unremarkable. There is no hydronephrosis or nephrolithiasis on either side. The visualized ureters and urinary bladder appear unremarkable. Air within the bladder likely introduced via recent  instrumentation. Stomach/Bowel: There is sigmoid diverticulosis without active inflammatory changes. Diffuse thickened appearance of the distal colon, likely related to underdistention and ascites. Mild colitis is less likely but not excluded  clinical correlation is recommended. Multiple dilated small bowel loops measuring up to 3.8 cm. The distal small bowel and terminal ileum are collapsed. Overall slight interval worsening of the dilatation of small bowel compared to prior CT. Small amount of contrast noted within the colon. Findings may represent worsening ileus or obstruction. Follow-up with small-bowel series recommended. Appendectomy. There is a 6.8 x 3.5 cm fluid in the right lower quadrant which has increased in size since the prior CT. It is indeterminate whether this collection is loculated or infected on this noncontrast CT. Vascular/Lymphatic: Advanced aortoiliac atherosclerotic disease. The IVC is unremarkable. No portal venous gas. There is no adenopathy. Reproductive: The uterus is anteverted. Several small calcified fibroids noted. No adnexal masses. Other: Diffuse mesenteric and subcutaneous edema and anasarca. Musculoskeletal: Osteopenia with degenerative changes of the spine. No acute osseous pathology. IMPRESSION: 1. Status post appendectomy. Interval increase in the size of the right lower quadrant fluid collection compared to the prior CT. It is indeterminate whether this collection is loculated or infected on this noncontrast CT. 2. Small upper abdominal pneumoperitoneum, likely related to recent appendectomy. 3. Progression of small bowel dilatation which may represent worsening ileus or obstruction. Follow-up with small-bowel series recommended. 4. Sigmoid diverticulosis. 5. Cholelithiasis. 6. Partially visualized bilateral pleural effusions, right greater left with associated partial compressive atelectasis of the lower lobes versus pneumonia. Clinical correlation is recommended. 7. Aortic  Atherosclerosis (ICD10-I70.0). Electronically Signed   By: Anner Crete M.D.   On: 08/07/2020 20:18   DG CHEST PORT 1 VIEW  Result Date: 08/07/2020 CLINICAL DATA:  Shortness of breath. EXAM: PORTABLE CHEST 1 VIEW COMPARISON:  Single-view of the chest 08/03/2020 and 02/28/2020. FINDINGS: NG tube seen on the prior study has been removed. The lungs are clear. Heart size is enlarged. Trace left pleural effusion noted. Right pleural effusion seen on CT today is not visible on this film. No pneumothorax. Coronary artery stent and surgical clips in the right axilla are identified. IMPRESSION: Trace left pleural effusion. Right pleural effusion seen on CT today is not visible on this exam. Lungs are clear. Cardiomegaly without evidence of pulmonary edema. Aortic Atherosclerosis (ICD10-I70.0). Electronically Signed   By: Inge Rise M.D.   On: 08/07/2020 14:41        Scheduled Meds:  amiodarone  200 mg Oral Daily   carvedilol  25 mg Oral BID WC   ferrous sulfate  325 mg Oral QAC lunch   hydrALAZINE  100 mg Oral TID   isosorbide mononitrate  120 mg Oral QHS   loperamide  2 mg Oral BID   multivitamin with minerals  1 tablet Oral Daily   pantoprazole  40 mg Oral Q1200   potassium chloride  40 mEq Oral BID   rosuvastatin  2.5 mg Oral Q48H     LOS: 7 days    Time spent: 35 minutes    Ashani Pumphrey Darleen Crocker, DO [facility]  If 7PM-7AM, please contact night-coverage [email or web address] 08/09/2020, 10:34 AM

## 2020-08-09 NOTE — Progress Notes (Signed)
6 Days Post-Op  Subjective: Patient states her bowel movements are slightly better.  Objective: Vital signs in last 24 hours: Temp:  [98.2 F (36.8 C)] 98.2 F (36.8 C) (07/14 0304) Pulse Rate:  [59-70] 70 (07/14 0304) Resp:  [18-19] 19 (07/14 0304) BP: (147-164)/(42-52) 147/42 (07/14 0304) SpO2:  [96 %-97 %] 96 % (07/14 0304) Last BM Date: 08/08/20  Intake/Output from previous day: 07/13 0701 - 07/14 0700 In: 840 [P.O.:840] Out: 1800 [Urine:1800] Intake/Output this shift: No intake/output data recorded.  General appearance: alert, cooperative, and no distress GI: Soft, mildly distended.  Nontender.  Incisions healing well.  Lab Results:  Recent Labs    08/08/20 0451 08/09/20 0323  WBC 16.6* 17.8*  HGB 8.3* 8.9*  HCT 25.6* 26.7*  PLT 283 345   BMET Recent Labs    08/08/20 0451 08/09/20 0323  NA 131* 133*  K 3.8 4.2  CL 105 106  CO2 19* 19*  GLUCOSE 106* 87  BUN 43* 42*  CREATININE 1.98* 1.93*  CALCIUM 7.8* 8.1*   PT/INR No results for input(s): LABPROT, INR in the last 72 hours.  Studies/Results: CT ABDOMEN PELVIS WO CONTRAST  Result Date: 08/07/2020 CLINICAL DATA:  86 year old female status post appendectomy. Evaluate for abscess. EXAM: CT ABDOMEN AND PELVIS WITHOUT CONTRAST TECHNIQUE: Multidetector CT imaging of the abdomen and pelvis was performed following the standard protocol without IV contrast. COMPARISON:  CT abdomen pelvis dated 08/01/2020. FINDINGS: Evaluation of this exam is limited in the absence of intravenous contrast. Lower chest: Partially visualized bilateral pleural effusions, right greater left. There is associated partial compressive atelectasis of the lower lobes versus pneumonia. Clinical correlation is recommended. There is 3 vessel coronary vascular calcification. There is hypoattenuation of the cardiac blood pool suggestive of anemia. Clinical correlation is recommended. Small upper abdominal pneumoperitoneum, likely related to recent  appendectomy. There is diffuse mesenteric edema and small ascites. Hepatobiliary: The liver is unremarkable. No intrahepatic biliary dilatation. Gallstone. No 80 S of acute cholecystitis by CT. Pancreas: Unremarkable. No pancreatic ductal dilatation or surrounding inflammatory changes. Spleen: Normal in size without focal abnormality. Adrenals/Urinary Tract: The adrenal glands are unremarkable. There is no hydronephrosis or nephrolithiasis on either side. The visualized ureters and urinary bladder appear unremarkable. Air within the bladder likely introduced via recent instrumentation. Stomach/Bowel: There is sigmoid diverticulosis without active inflammatory changes. Diffuse thickened appearance of the distal colon, likely related to underdistention and ascites. Mild colitis is less likely but not excluded clinical correlation is recommended. Multiple dilated small bowel loops measuring up to 3.8 cm. The distal small bowel and terminal ileum are collapsed. Overall slight interval worsening of the dilatation of small bowel compared to prior CT. Small amount of contrast noted within the colon. Findings may represent worsening ileus or obstruction. Follow-up with small-bowel series recommended. Appendectomy. There is a 6.8 x 3.5 cm fluid in the right lower quadrant which has increased in size since the prior CT. It is indeterminate whether this collection is loculated or infected on this noncontrast CT. Vascular/Lymphatic: Advanced aortoiliac atherosclerotic disease. The IVC is unremarkable. No portal venous gas. There is no adenopathy. Reproductive: The uterus is anteverted. Several small calcified fibroids noted. No adnexal masses. Other: Diffuse mesenteric and subcutaneous edema and anasarca. Musculoskeletal: Osteopenia with degenerative changes of the spine. No acute osseous pathology. IMPRESSION: 1. Status post appendectomy. Interval increase in the size of the right lower quadrant fluid collection compared to  the prior CT. It is indeterminate whether this collection is loculated or infected  on this noncontrast CT. 2. Small upper abdominal pneumoperitoneum, likely related to recent appendectomy. 3. Progression of small bowel dilatation which may represent worsening ileus or obstruction. Follow-up with small-bowel series recommended. 4. Sigmoid diverticulosis. 5. Cholelithiasis. 6. Partially visualized bilateral pleural effusions, right greater left with associated partial compressive atelectasis of the lower lobes versus pneumonia. Clinical correlation is recommended. 7. Aortic Atherosclerosis (ICD10-I70.0). Electronically Signed   By: Anner Crete M.D.   On: 08/07/2020 20:18   DG CHEST PORT 1 VIEW  Result Date: 08/07/2020 CLINICAL DATA:  Shortness of breath. EXAM: PORTABLE CHEST 1 VIEW COMPARISON:  Single-view of the chest 08/03/2020 and 02/28/2020. FINDINGS: NG tube seen on the prior study has been removed. The lungs are clear. Heart size is enlarged. Trace left pleural effusion noted. Right pleural effusion seen on CT today is not visible on this film. No pneumothorax. Coronary artery stent and surgical clips in the right axilla are identified. IMPRESSION: Trace left pleural effusion. Right pleural effusion seen on CT today is not visible on this exam. Lungs are clear. Cardiomegaly without evidence of pulmonary edema. Aortic Atherosclerosis (ICD10-I70.0). Electronically Signed   By: Inge Rise M.D.   On: 08/07/2020 14:41    Anti-infectives: Anti-infectives (From admission, onward)    Start     Dose/Rate Route Frequency Ordered Stop   08/06/20 1200  cefTRIAXone (ROCEPHIN) 2 g in sodium chloride 0.9 % 100 mL IVPB        2 g 200 mL/hr over 30 Minutes Intravenous Every 24 hours 08/06/20 1112     08/04/20 1000  ciprofloxacin (CIPRO) IVPB 400 mg  Status:  Discontinued        400 mg 200 mL/hr over 60 Minutes Intravenous Every 24 hours 08/03/20 1044 08/06/20 1112   08/02/20 1100  ciprofloxacin (CIPRO)  IVPB 400 mg  Status:  Discontinued        400 mg 200 mL/hr over 60 Minutes Intravenous Every 12 hours 08/01/20 2219 08/03/20 1044   08/02/20 0600  metroNIDAZOLE (FLAGYL) IVPB 500 mg        500 mg 100 mL/hr over 60 Minutes Intravenous Every 8 hours 08/01/20 2219     08/01/20 2145  ciprofloxacin (CIPRO) IVPB 400 mg        400 mg 200 mL/hr over 60 Minutes Intravenous  Once 08/01/20 2139 08/01/20 2337   08/01/20 2145  metroNIDAZOLE (FLAGYL) IVPB 500 mg        500 mg 100 mL/hr over 60 Minutes Intravenous  Once 08/01/20 2139 08/01/20 2324       Assessment/Plan: s/p Procedure(s): APPENDECTOMY LAPAROSCOPIC Impression: Postoperative day 6, status post laparoscopic appendectomy.  White blood cell count continues to stay elevated.  CT scan results were variable as to whether she has an intra-abdominal abscess that needs to be drained.  Given that she has been on IV antibiotics for 9 days, I think we need to give her an antibiotic holiday and see what her white blood cell count does.  Should they continue to rise, IR aspiration of a fluid connection may be warranted.  Discussed with Dr. Manuella Ghazi.  LOS: 7 days    Aviva Signs 08/09/2020

## 2020-08-09 NOTE — Evaluation (Signed)
Physical Therapy Evaluation Patient Details Name: Crystal [name] MRN: [id number] DOB: 1934-08-29 Today's Date: 08/09/2020   History of Present Illness  Crystal [name] 86 y.o. female with medical history significant of iron deficiency anemia, Bell's palsy, history of breast cancer and right mastectomy, CAD, history of STEMI, chronic systolic heart failure, paroxysmal atrial fibrillation, stage IV chronic kidney disease, essential hypertension, GERD, gout, history of squamous cell carcinoma of the skin, mixed hyperlipidemia, osteopenia, thyroid nodule, type 2 diabetes mellitus who came to the emergency department due to abdominal pain, bloating with frequent dry heaving for the past 3 days.  Patient was admitted with ileus in the setting of appendicitis and noted electrolyte abnormalities.  She had undergone appendectomy on 08/03/2020.  She continues to have leukocytosis.   Clinical Impression  Patient agreeable to participating in PT evaluation today. Patient stating she will be going to live with her nephew and his family upon DC from hospital. Patient performed mobility well today but does require assist for transfers and ambulation. Patient does have increased abdominal pain with movement but tolerated it well during mobility. Patient did up [place] elevated and bedrails for bed mobility but was able to complete modified independent.  Patient requested to perform ambulation in room secondary to bouts of diarrhea. Patient did require verbal cues for ambulation with RW and staying within the base of support to decrease risk of falls. Patient would continue to benefit from skilled physical therapy in current environment and next venue to continue return to prior function and increase strength, endurance, balance, coordination, and functional mobility and gait skills.      Follow Up Recommendations Home health PT;Supervision for mobility/OOB    Equipment Recommendations  None recommended by PT     Recommendations for Other Services       Precautions / Restrictions Precautions Precautions: Fall Precaution Comments: Patient reports no falls in last 6 months. Restrictions Weight Bearing Restrictions: No      Mobility  Bed Mobility Overal bed mobility: Needs Assistance Bed Mobility: Supine to Sit     Supine to sit: Modified independent (Device/Increase time);HOB elevated     General bed mobility comments: use of bedrail, increased time    Transfers Overall transfer level: Needs assistance Equipment used: Rolling walker (2 wheeled) Transfers: Sit to/from [facility];Anterior-Posterior Transfer Sit to Stand: Min assist Stand pivot transfers: Min guard   Anterior-Posterior transfers: Min guard   General transfer comment: slow, labored movement, difficulty with power up requiring assistance after multiple attempts  Ambulation/Gait Ambulation/Gait assistance: Min guard Gait Distance (Feet): 40 Feet Assistive device: Rolling walker (2 wheeled) Gait Pattern/deviations: Step-through pattern;Decreased step length - right;Decreased step length - left;Decreased stride length;Trunk flexed;Wide base of support Gait velocity: decreased   General Gait Details: slow, labored cadence with RW with forward bent positioning; verbal cues to walk within base of support; limited by fatigue; on room air.  Stairs            Wheelchair Mobility    Modified Rankin (Stroke Patients Only)       Balance Overall balance assessment: Needs assistance Sitting-balance support: No upper extremity supported;Feet supported Sitting balance-Leahy Scale: Good     Standing balance support: Bilateral upper extremity supported;During functional activity Standing balance-Leahy Scale: Fair Standing balance comment: with RW                             Pertinent Vitals/Pain Pain Assessment: 0-10 Pain Score: 5  Pain Location: abdomen Pain Descriptors /  Indicators: Dull Pain Intervention(s): Limited activity within patient's tolerance;Monitored during session;Repositioned    Home Living Family/patient expects to be discharged to:: Private residence Living Arrangements: Alone (planning on going to live with nephew at DC from hospital.) Available Help at Discharge: Family;Available 24 hours/day (nephew, neice, grandson) Type of Home: House Home Access: Stairs to enter Entrance Stairs-Rails: None Entrance Stairs-Number of Steps: 1 Home Layout: One level Home Equipment: Tub bench;Bedside commode;Grab bars - toilet;Walker - 2 wheels      Prior Function Level of Independence: Independent         Comments: was searching for assistance with house cleaning prior to admission; patient was driving     Hand Dominance        Extremity/Trunk Assessment   Upper Extremity Assessment Upper Extremity Assessment: Generalized weakness    Lower Extremity Assessment Lower Extremity Assessment: Generalized weakness    Cervical / Trunk Assessment Cervical / Trunk Assessment: Kyphotic  Communication   Communication: No difficulties  Cognition Arousal/Alertness: Awake/alert Behavior During Therapy: WFL for tasks assessed/performed Overall Cognitive Status: Within Functional Limits for tasks assessed                                        General Comments      Exercises     Assessment/Plan    PT Assessment Patient needs continued PT services  PT Problem List Decreased strength;Decreased knowledge of use of DME;Decreased activity tolerance;Decreased balance;Pain;Decreased mobility       PT Treatment Interventions DME instruction;Balance training;Gait training;Neuromuscular re-education;Patient/family education;Stair training;Therapeutic activities;Therapeutic exercise;Functional mobility training    PT Goals (Current goals can be found in the Care Plan section)  Acute Rehab PT Goals Patient Stated Goal:  Temporarily live with nephew for assistance and get HHPT. PT Goal Formulation: With patient Time For Goal Achievement: 08/23/20 Potential to Achieve Goals: Fair    Frequency Min 3X/week   Barriers to discharge           AM-PAC PT "6 Clicks" Mobility  Outcome Measure Help needed turning from your back to your side while in a flat bed without using bedrails?: A Little Help needed moving from lying on your back to sitting on the side of a flat bed without using bedrails?: A Little Help needed moving to and from a bed to a chair (including a wheelchair)?: A Little Help needed standing up from a chair using your arms (e.g., wheelchair or bedside chair)?: A Little Help needed to walk in hospital room?: A Little Help needed climbing 3-5 steps with a railing? : A Lot 6 Click Score: 17    End of Session Equipment Utilized During Treatment: Gait belt Activity Tolerance: Patient tolerated treatment well;Patient limited by fatigue Patient left: in chair;with call bell/phone within reach Nurse Communication: Mobility status PT Visit Diagnosis: Unsteadiness on feet (R26.81);Other abnormalities of gait and mobility (R26.89);Muscle weakness (generalized) (M62.81)    Time: 6389-3734 PT Time Calculation (min) (ACUTE ONLY): 27 min   Charges:   PT Evaluation $PT Eval Low Complexity: 1 Low PT Treatments $Therapeutic Activity: 8-22 mins        Floria Raveling. Hartnett-Rands, MS, PT Per [place] [phone]  Pamala Hurry  Hartnett-Rands 08/09/2020, 12:23 PM

## 2020-08-10 LAB — CBC
HCT: 27.7 % — ABNORMAL LOW (ref 36.0–46.0)
Hemoglobin: 8.8 g/dL — ABNORMAL LOW (ref 12.0–15.0)
MCH: 27.5 pg (ref 26.0–34.0)
MCHC: 31.8 g/dL (ref 30.0–36.0)
MCV: 86.6 fL (ref 80.0–100.0)
Platelets: 362 10*3/uL (ref 150–400)
RBC: 3.2 MIL/uL — ABNORMAL LOW (ref 3.87–5.11)
RDW: 17.4 % — ABNORMAL HIGH (ref 11.5–15.5)
WBC: 15.5 10*3/uL — ABNORMAL HIGH (ref 4.0–10.5)
nRBC: 0 % (ref 0.0–0.2)

## 2020-08-10 LAB — BASIC METABOLIC PANEL
Anion gap: 7 (ref 5–15)
BUN: 40 mg/dL — ABNORMAL HIGH (ref 8–23)
CO2: 21 mmol/L — ABNORMAL LOW (ref 22–32)
Calcium: 8.2 mg/dL — ABNORMAL LOW (ref 8.9–10.3)
Chloride: 106 mmol/L (ref 98–111)
Creatinine, Ser: 1.85 mg/dL — ABNORMAL HIGH (ref 0.44–1.00)
GFR, Estimated: 27 mL/min — ABNORMAL LOW (ref >60)
Glucose, Bld: 94 mg/dL (ref 70–99)
Potassium: 4.5 mmol/L (ref 3.5–5.1)
Sodium: 134 mmol/L — ABNORMAL LOW (ref 135–145)

## 2020-08-10 LAB — MAGNESIUM: Magnesium: 1.7 mg/dL (ref 1.7–2.4)

## 2020-08-10 NOTE — Discharge Summary (Signed)
Physician Discharge Summary  Crystal [name] FIE:[id number] DOB: [birth year] DOA: 08/01/2020  PCP: Glenda Chroman, MD  Admit date: 08/01/2020  Discharge date: 08/10/2020  Admitted From:Home  Disposition:  Home  Recommendations for Outpatient Follow-up:  Follow up with PCP in 1-2 weeks Patient will be followed up with general surgery within the next couple weeks Continue on home medications as noted below No further need for antibiotics Follow-up with cardiology as scheduled on 09/07/2020 to discuss concerns regarding Eliquis  Home Health:yes with PT, RN  Equipment/Devices: None  Discharge Condition:Stable  CODE STATUS: Full  Diet recommendation: Heart Healthy  Brief/Interim Summary: Crystal [name] 86 y.o. female with medical history significant of iron deficiency anemia, Bell's palsy, history of breast cancer and right mastectomy, CAD, history of STEMI, chronic systolic heart failure, paroxysmal atrial fibrillation, stage IV chronic kidney disease, essential hypertension, GERD, gout, history of squamous cell carcinoma of the skin, mixed hyperlipidemia, osteopenia, thyroid nodule, type 2 diabetes mellitus who came to the emergency department due to abdominal pain, bloating with frequent dry heaving for the past 3 days.  Patient was admitted with ileus in the setting of appendicitis and noted electrolyte abnormalities.  She had undergone appendectomy on 08/03/2020.  She had no significant complications from the appendectomy but continued to have some leukocytosis for which repeat CT abdominal imaging was performed demonstrating a mild postoperative fluid collection.  She remained on IV antibiotics with Rocephin due to concern for possibility of abscess, but she has significant clinical improvement and did not require any drainage or further evaluation for this.  Her leukocytosis is now down trended and she has remained afebrile and appears stable for discharge to home.  She will have further  follow-up with general surgery in the outpatient setting and is to continue her usual home medications as otherwise prescribed.  Discharge Diagnoses:  Principal Problem:   Ileus ([place]) Active Problems:   Type 2 diabetes mellitus (HCC)   Hyperlipidemia   CAD (coronary artery disease)   Hyperlipidemia LDL goal <70   AF (paroxysmal atrial fibrillation) (HCC)   Acute kidney injury ([place])   Iron deficiency anemia   Hyponatremia   Hypokalemia  Principal discharge diagnosis: Ileus in the setting of acute appendicitis status post appendectomy 08/03/2020.  Discharge Instructions  Discharge Instructions     Diet - low sodium heart healthy   Complete by: As directed    Increase activity slowly   Complete by: As directed    No wound care   Complete by: As directed       Allergies as of 08/10/2020       Reactions   Bactrim [sulfamethoxazole-trimethoprim] Nausea And Vomiting   Sulfa Antibiotics Nausea And Vomiting   Amlodipine Other (See Comments)   EDEMA   Clonidine Derivatives Other (See Comments)   FATIGUE   Evista [raloxifene Hcl] Other (See Comments)   GERD   Fosamax [alendronate Sodium] Other (See Comments)   INCREASED GERD    Glipizide Other (See Comments)   PALPITATIONS   Lipitor [atorvastatin Calcium] Other (See Comments)   ACHING   Losartan Other (See Comments)   FATIGUE    Pravastatin Other (See Comments)   ACHING   Azithromycin Rash, Other (See Comments)   FACIAL BURNING         Medication List     STOP taking these medications    CALCIUM 500 + D PO   CINNAMON PO   FISH OIL PO   sodium chloride 0.65 % Soln nasal  spray Commonly known as: OCEAN       TAKE these medications    acetaminophen 500 MG tablet Commonly known as: TYLENOL Take 500 mg by mouth every 6 (six) hours as needed for headache (pain).   amiodarone 200 MG tablet Commonly known as: PACERONE Take 200 mg by mouth daily.   amLODipine 2.5 MG tablet Commonly known as: NORVASC Take  1 tablet (2.5 mg total) by mouth daily. What changed: Another medication with the same name was removed. Continue taking this medication, and follow the directions you see here.   carvedilol 25 MG tablet Commonly known as: COREG Take 1 tablet (25 mg total) by mouth in the morning and at bedtime. Breakfast & supper   cholecalciferol 25 MCG (1000 UNIT) tablet Commonly known as: VITAMIN D3 Take 1,000 Units by mouth daily with breakfast.   diazepam 2 MG tablet Commonly known as: VALIUM Take 2 mg by mouth 2 (two) times daily as needed (dizzy spells.).   Eliquis 2.5 MG Tabs tablet Generic drug: apixaban TAKE (1) TABLET TWICE DAILY. What changed: See the new instructions.   epoetin alfa 3000 UNIT/ML injection Commonly known as: EPOGEN Inject into the skin.   ferrous sulfate 325 (65 FE) MG tablet Take 325 mg by mouth daily before lunch.   hydrALAZINE 100 MG tablet Commonly known as: APRESOLINE TAKE 1 TABLET EVERY 8 HOURS What changed: See the new instructions.   isosorbide mononitrate 120 MG 24 hr tablet Commonly known as: IMDUR TAKE 1 TABLET BY MOUTH AT BEDTIME.   loperamide 2 MG capsule Commonly known as: IMODIUM Take 2-4 mg by mouth 4 (four) times daily as needed for diarrhea or loose stools.   meclizine 25 MG tablet Commonly known as: ANTIVERT Take 25 mg by mouth 2 (two) times daily as needed for dizziness.   multivitamin with minerals Tabs tablet Take 1 tablet by mouth daily. Centrum Silver for Women   multivitamin-lutein Caps capsule Take 1 capsule by mouth daily before lunch.   nitroGLYCERIN 0.4 MG SL tablet Commonly known as: NITROSTAT Place 1 tablet (0.4 mg total) under the tongue every 5 (five) minutes x 3 doses as needed for chest pain.   pantoprazole 40 MG tablet Commonly known as: PROTONIX Take 1 tablet (40 mg total) by mouth daily. Take 30 minutes before breakfast   potassium chloride SA 20 MEQ tablet Commonly known as: KLOR-CON Take 3 tablets (60 mEq  total) by mouth daily.   rosuvastatin 5 MG tablet Commonly known as: CRESTOR Take 0.5 tablets (2.5 mg total) by mouth every other day.   saccharomyces boulardii 250 MG capsule Commonly known as: FLORASTOR Take 250 mg by mouth 2 (two) times daily as needed (diarrhea).   torsemide 20 MG tablet Commonly known as: DEMADEX Take 2 tablets (40 mg total) by mouth daily.        Follow-up Information     [facility] Follow [facility].   Specialty: [place] Why: PT/ RN to call to schedule your first home vist in [place], [place], MD Follow up.   Specialty: General Surgery Why: As needed.  Will call you in two weeks for follow up Contact information: [address] [place] [address] [phone]         Vyas, Dhruv B, MD. Schedule an appointment as soon as possible for a visit in 1 week(s).   Specialty: Internal Medicine Contact information: [address] [place] [address] [phone]  Allergies  Allergen Reactions   Bactrim [Sulfamethoxazole-Trimethoprim] Nausea And Vomiting   Sulfa Antibiotics Nausea And Vomiting   Amlodipine Other (See Comments)    EDEMA   Clonidine Derivatives Other (See Comments)    FATIGUE   Evista [Raloxifene Hcl] Other (See Comments)    GERD   Fosamax [Alendronate Sodium] Other (See Comments)    INCREASED GERD    Glipizide Other (See Comments)    PALPITATIONS   Lipitor [Atorvastatin Calcium] Other (See Comments)    ACHING   Losartan Other (See Comments)    FATIGUE    Pravastatin Other (See Comments)    ACHING   Azithromycin Rash and Other (See Comments)    FACIAL BURNING     Consultations: General surgery   Procedures/Studies: CT ABDOMEN PELVIS WO CONTRAST  Result Date: 08/07/2020 CLINICAL DATA:  86 year old female status post appendectomy. Evaluate for abscess. EXAM: CT ABDOMEN AND PELVIS WITHOUT CONTRAST TECHNIQUE: Multidetector CT imaging of the  abdomen and pelvis was performed following the standard protocol without IV contrast. COMPARISON:  CT abdomen pelvis dated 08/01/2020. FINDINGS: Evaluation of this exam is limited in the absence of intravenous contrast. Lower chest: Partially visualized bilateral pleural effusions, right greater left. There is associated partial compressive atelectasis of the lower lobes versus pneumonia. Clinical correlation is recommended. There is 3 vessel coronary vascular calcification. There is hypoattenuation of the cardiac blood pool suggestive of anemia. Clinical correlation is recommended. Small upper abdominal pneumoperitoneum, likely related to recent appendectomy. There is diffuse mesenteric edema and small ascites. Hepatobiliary: The liver is unremarkable. No intrahepatic biliary dilatation. Gallstone. No 80 S of acute cholecystitis by CT. Pancreas: Unremarkable. No pancreatic ductal dilatation or surrounding inflammatory changes. Spleen: Normal in size without focal abnormality. Adrenals/Urinary Tract: The adrenal glands are unremarkable. There is no hydronephrosis or nephrolithiasis on either side. The visualized ureters and urinary bladder appear unremarkable. Air within the bladder likely introduced via recent instrumentation. Stomach/Bowel: There is sigmoid diverticulosis without active inflammatory changes. Diffuse thickened appearance of the distal colon, likely related to underdistention and ascites. Mild colitis is less likely but not excluded clinical correlation is recommended. Multiple dilated small bowel loops measuring up to 3.8 cm. The distal small bowel and terminal ileum are collapsed. Overall slight interval worsening of the dilatation of small bowel compared to prior CT. Small amount of contrast noted within the colon. Findings may represent worsening ileus or obstruction. Follow-up with small-bowel series recommended. Appendectomy. There is a 6.8 x 3.5 cm fluid in the right lower quadrant which has  increased in size since the prior CT. It is indeterminate whether this collection is loculated or infected on this noncontrast CT. Vascular/Lymphatic: Advanced aortoiliac atherosclerotic disease. The IVC is unremarkable. No portal venous gas. There is no adenopathy. Reproductive: The uterus is anteverted. Several small calcified fibroids noted. No adnexal masses. Other: Diffuse mesenteric and subcutaneous edema and anasarca. Musculoskeletal: Osteopenia with degenerative changes of the spine. No acute osseous pathology. IMPRESSION: 1. Status post appendectomy. Interval increase in the size of the right lower quadrant fluid collection compared to the prior CT. It is indeterminate whether this collection is loculated or infected on this noncontrast CT. 2. Small upper abdominal pneumoperitoneum, likely related to recent appendectomy. 3. Progression of small bowel dilatation which may represent worsening ileus or obstruction. Follow-up with small-bowel series recommended. 4. Sigmoid diverticulosis. 5. Cholelithiasis. 6. Partially visualized bilateral pleural effusions, right greater left with associated partial compressive atelectasis of the lower lobes versus pneumonia. Clinical correlation is recommended. 7. Aortic  Atherosclerosis (ICD10-I70.0). Electronically Signed   By: Anner Crete M.D.   On: 08/07/2020 20:18   CT ABDOMEN PELVIS WO CONTRAST  Result Date: 08/01/2020 CLINICAL DATA:  Abdominal pain, constipation EXAM: CT ABDOMEN AND PELVIS WITHOUT CONTRAST TECHNIQUE: Multidetector CT imaging of the abdomen and pelvis was performed following the standard protocol without IV contrast. COMPARISON:  CTA abdomen/pelvis dated 04/16/2019 FINDINGS: Lower chest: Small right pleural effusion. Hepatobiliary: Unenhanced liver is grossly unremarkable. Layering gallstone (series 2/image 29), without associated inflammatory changes. No intrahepatic or extrahepatic ductal dilatation. Pancreas: Within normal limits. Spleen:  Within normal limits. Adrenals/Urinary Tract: Adrenal glands are within normal limits. Kidneys are within normal limits. No renal, ureteral, or bladder calculi. No hydronephrosis. Bladder is within normal limits. Stomach/Bowel: Stomach is notable for a small hiatal hernia. Multiple dilated loops of small bowel in the left mid abdomen. Given the additional findings, this favors reactive small bowel ileus over small bowel obstruction. Abnormal blind-ending tubular structure in the right lower quadrant (series 2/image 83) is poorly visualized/partially obscured by fluid but favors an abnormal appendix, suggesting acute appendicitis. This is distinct from the terminal ileum (series 2/image 56). Sigmoid diverticulosis, without evidence of diverticulitis. Vascular/Lymphatic: No evidence of abdominal aortic aneurysm. Atherosclerotic calcifications of the abdominal aorta and branch vessels. No suspicious abdominopelvic lymphadenopathy. Reproductive: Uterine fibroids. No adnexal masses. Other: Small to moderate abdominopelvic ascites, including fluid in the right lower quadrant which partially obscures the suspected appendix. No free air to suggest macroscopic perforation. Musculoskeletal: Degenerative changes of the visualized thoracolumbar spine. IMPRESSION: Although poorly visualized, there is a suspected abnormal appendix in the right lower quadrant, suggesting acute appendicitis. No free air to suggest macroscopic perforation. Multiple dilated loops of small bowel in the left mid abdomen. In this clinical setting, this appearance favors reactive small bowel ileus over small bowel obstruction. Sigmoid diverticulosis, without evidence of diverticulitis. Small to moderate abdominopelvic ascites, including fluid in the right lower quadrant. Electronically Signed   By: Julian Hy M.D.   On: 08/01/2020 19:06   DG CHEST PORT 1 VIEW  Result Date: 08/07/2020 CLINICAL DATA:  Shortness of breath. EXAM: PORTABLE CHEST 1  VIEW COMPARISON:  Single-view of the chest 08/03/2020 and 02/28/2020. FINDINGS: NG tube seen on the prior study has been removed. The lungs are clear. Heart size is enlarged. Trace left pleural effusion noted. Right pleural effusion seen on CT today is not visible on this film. No pneumothorax. Coronary artery stent and surgical clips in the right axilla are identified. IMPRESSION: Trace left pleural effusion. Right pleural effusion seen on CT today is not visible on this exam. Lungs are clear. Cardiomegaly without evidence of pulmonary edema. Aortic Atherosclerosis (ICD10-I70.0). Electronically Signed   By: Inge Rise M.D.   On: 08/07/2020 14:41   DG Chest Port 1 View  Result Date: 08/03/2020 CLINICAL DATA:  NG tube placement EXAM: PORTABLE CHEST 1 VIEW COMPARISON:  02/28/2020 FINDINGS: Enteric tube identified coursing below the diaphragm with distal tip and side port projecting within the expected location of the gastric body. Stable cardiomegaly. Atherosclerotic calcification of the aortic knob. No appreciable pleural fluid collection. No focal airspace consolidation, or pneumothorax. IMPRESSION: Enteric tube appropriately positioned within the gastric body. Electronically Signed   By: Davina Poke D.O.   On: 08/03/2020 11:07     Discharge Exam: Vitals:   08/09/20 2004 08/10/20 0249  BP: (!) 153/47 (!) 148/52  Pulse: 61 64  Resp: 19 19  Temp: 98 F (36.7 C) 98 F (36.7 C)  SpO2: 96% 95%   Vitals:   08/09/20 0304 08/09/20 1300 08/09/20 2004 08/10/20 0249  BP: (!) 147/42 (!) 150/50 (!) 153/47 (!) 148/52  Pulse: 70 (!) 58 61 64  Resp: 19 18 19 19   Temp: 98.2 F (36.8 C) 98.2 F (36.8 C) 98 F (36.7 C) 98 F (36.7 C)  TempSrc:  Oral    SpO2: 96% 99% 96% 95%  Weight:      Height:        General: Pt is alert, awake, not in acute distress Cardiovascular: RRR, S1/S2 +, no rubs, no gallops Respiratory: CTA bilaterally, no wheezing, no rhonchi Abdominal: Soft, NT, ND, bowel  sounds +, incisions clean dry and intact Extremities: no edema, no cyanosis    The results of significant diagnostics from this hospitalization (including imaging, microbiology, ancillary and laboratory) are listed below for reference.     Microbiology: Recent Results (from the past 240 hour(s))  Resp Panel by RT-PCR (Flu A&B, Covid) Nasopharyngeal Swab     Status: None   Collection Time: 08/01/20  8:03 PM   Specimen: Nasopharyngeal Swab; Nasopharyngeal(NP) swabs in vial transport medium  Result Value Ref Range Status   SARS Coronavirus 2 by RT PCR NEGATIVE NEGATIVE Final    Comment: (NOTE) SARS-CoV-2 target nucleic acids are NOT DETECTED.  The SARS-CoV-2 RNA is generally detectable in upper respiratory specimens during the acute phase of infection. The lowest concentration of SARS-CoV-2 viral copies this assay can detect is 138 copies/mL. A negative result does not preclude SARS-Cov-2 infection and should not be used as the sole basis for treatment or other patient management decisions. A negative result may occur with  improper specimen collection/handling, submission of specimen other than nasopharyngeal swab, presence of viral mutation(s) within the areas targeted by this assay, and inadequate number of viral copies(<138 copies/mL). A negative result must be combined with clinical observations, patient history, and epidemiological information. The expected result is Negative.  Fact Sheet for Patients:  EntrepreneurPulse.com.au  Fact Sheet for Healthcare Providers:  IncredibleEmployment.be  This test is no t yet approved or cleared by the [place] FDA and  has been authorized for detection and/or diagnosis of SARS-CoV-2 by FDA under an Emergency Use Authorization (EUA). This EUA will remain  in effect (meaning this test can be used) for the duration of the COVID-19 declaration under Section 564(b)(1) of the Act, 21 U.S.C.section  360bbb-3(b)(1), unless the authorization is terminated  or revoked sooner.       Influenza A by PCR NEGATIVE NEGATIVE Final   Influenza B by PCR NEGATIVE NEGATIVE Final    Comment: (NOTE) The Xpert Xpress SARS-CoV-2/FLU/RSV plus assay is intended as an aid in the diagnosis of influenza from Nasopharyngeal swab specimens and should not be used as a sole basis for treatment. Nasal washings and aspirates are unacceptable for Xpert Xpress SARS-CoV-2/FLU/RSV testing.  Fact Sheet for Patients: EntrepreneurPulse.com.au  Fact Sheet for Healthcare Providers: IncredibleEmployment.be  This test is not yet approved or cleared by the [place] FDA and has been authorized for detection and/or diagnosis of SARS-CoV-2 by FDA under an Emergency Use Authorization (EUA). This EUA will remain in effect (meaning this test can be used) for the duration of the COVID-19 declaration under Section 564(b)(1) of the Act, 21 U.S.C. section 360bbb-3(b)(1), unless the authorization is terminated or revoked.  Performed at [facility], [address] [place], [place] [address]   Urine Culture     Status: Abnormal   Collection Time: 08/02/20  9:47  AM   Specimen: Urine, Clean Catch  Result Value Ref Range Status   Specimen Description   Final    URINE, CLEAN CATCH Performed at [facility], [address] [place], [place] [address]    Special Requests   Final    Normal Performed at [facility], [address] [place], [place] [address]    Culture 90,000 COLONIES/mL ESCHERICHIA COLI (A)  Final   Report Status 08/05/2020 FINAL  Final   Organism ID, Bacteria ESCHERICHIA COLI (A)  Final      Susceptibility   Escherichia coli - MIC*    AMPICILLIN 4 SENSITIVE Sensitive     CEFAZOLIN <=4 SENSITIVE Sensitive     CEFEPIME <=0.12 SENSITIVE Sensitive     CEFTRIAXONE <=0.25 SENSITIVE Sensitive     CIPROFLOXACIN >=4 RESISTANT Resistant     GENTAMICIN <=1 SENSITIVE  Sensitive     IMIPENEM <=0.25 SENSITIVE Sensitive     NITROFURANTOIN <=16 SENSITIVE Sensitive     TRIMETH/SULFA <=20 SENSITIVE Sensitive     AMPICILLIN/SULBACTAM <=2 SENSITIVE Sensitive     PIP/TAZO <=4 SENSITIVE Sensitive     * 90,000 COLONIES/mL ESCHERICHIA COLI  Aerobic/Anaerobic Culture w Gram Stain (surgical/deep wound)     Status: None   Collection Time: 08/03/20  2:29 PM   Specimen: Tissue  Result Value Ref Range Status   Specimen Description TISSUE  Final   Special Requests BIOFILM ABDOMEN  Final   Gram Stain   Final    ABUNDANT WBC PRESENT, PREDOMINANTLY PMN FEW GRAM VARIABLE ROD RARE GRAM POSITIVE COCCI    Culture   Final    MODERATE ESCHERICHIA COLI FEW BACTEROIDES THETAIOTAOMICRON BETA LACTAMASE POSITIVE Performed at [facility], [place] [address], [place], [place] [address]    Report Status 08/08/2020 FINAL  Final   Organism ID, Bacteria ESCHERICHIA COLI  Final      Susceptibility   Escherichia coli - MIC*    AMPICILLIN 16 INTERMEDIATE Intermediate     CEFAZOLIN <=4 SENSITIVE Sensitive     CEFEPIME <=0.12 SENSITIVE Sensitive     CEFTAZIDIME <=1 SENSITIVE Sensitive     CEFTRIAXONE <=0.25 SENSITIVE Sensitive     CIPROFLOXACIN >=4 RESISTANT Resistant     GENTAMICIN <=1 SENSITIVE Sensitive     IMIPENEM <=0.25 SENSITIVE Sensitive     TRIMETH/SULFA >=320 RESISTANT Resistant     AMPICILLIN/SULBACTAM <=2 SENSITIVE Sensitive     PIP/TAZO <=4 SENSITIVE Sensitive     * MODERATE ESCHERICHIA COLI     Labs: BNP (last 3 results) Recent Labs    02/13/20 0355 02/18/20 0225 02/23/20 0350  BNP 4,423.6* 2,867.6* 5,361.4*   Basic Metabolic Panel: Recent Labs  Lab 08/06/20 0426 08/07/20 0106 08/08/20 0451 08/09/20 0323 08/10/20 0553  NA 137 132* 131* 133* 134*  K 4.1 3.6 3.8 4.2 4.5  CL 110 109 105 106 106  CO2 20* 18* 19* 19* 21*  GLUCOSE 107* 112* 106* 87 94  BUN 47* 47* 43* 42* 40*  CREATININE 1.87* 1.99* 1.98* 1.93* 1.85*  CALCIUM 8.5* 7.7* 7.8* 8.1*  8.2*  MG  --   --  1.6* 1.9 1.7   Liver Function Tests: No results for input(s): AST, ALT, ALKPHOS, BILITOT, PROT, ALBUMIN in the last 168 hours. No results for input(s): LIPASE, AMYLASE in the last 168 hours. No results for input(s): AMMONIA in the last 168 hours. CBC: Recent Labs  Lab 08/06/20 0426 08/07/20 0106 08/08/20 0451 08/09/20 0323 08/10/20 0553  WBC 16.4* 16.7* 16.6* 17.8* 15.5*  NEUTROABS  --   --  13.1*  --   --   HGB 9.2* 8.2* 8.3* 8.9* 8.8*  HCT 28.8* 24.7* 25.6* 26.7* 27.7*  MCV 86.5 85.8 84.5 84.8 86.6  PLT 235 219 283 345 362   Cardiac Enzymes: No results for input(s): CKTOTAL, CKMB, CKMBINDEX, TROPONINI in the last 168 hours. BNP: Invalid input(s): POCBNP CBG: Recent Labs  Lab 08/08/20 1608 08/09/20 0528 08/09/20 0732 08/09/20 1104 08/09/20 1601  GLUCAP 152* 91 95 158* 115*   D-Dimer No results for input(s): DDIMER in the last 72 hours. Hgb A1c No results for input(s): HGBA1C in the last 72 hours. Lipid Profile No results for input(s): CHOL, HDL, LDLCALC, TRIG, CHOLHDL, LDLDIRECT in the last 72 hours. Thyroid function studies No results for input(s): TSH, T4TOTAL, T3FREE, THYROIDAB in the last 72 hours.  Invalid input(s): FREET3 Anemia work up No results for input(s): VITAMINB12, FOLATE, FERRITIN, TIBC, IRON, RETICCTPCT in the last 72 hours. Urinalysis    Component Value Date/Time   COLORURINE YELLOW 08/02/2020 0947   APPEARANCEUR CLEAR 08/02/2020 0947   LABSPEC 1.009 08/02/2020 0947   PHURINE 5.0 08/02/2020 0947   GLUCOSEU NEGATIVE 08/02/2020 0947   HGBUR SMALL (A) 08/02/2020 [place] 08/02/2020 [place] 08/02/2020 0947   PROTEINUR 100 (A) 08/02/2020 0947   NITRITE NEGATIVE 08/02/2020 0947   LEUKOCYTESUR NEGATIVE 08/02/2020 0947   Sepsis Labs Invalid input(s): PROCALCITONIN,  WBC,  LACTICIDVEN Microbiology Recent Results (from the past 240 hour(s))  Resp Panel by RT-PCR (Flu A&B, Covid)  Nasopharyngeal Swab     Status: None   Collection Time: 08/01/20  8:03 PM   Specimen: Nasopharyngeal Swab; Nasopharyngeal(NP) swabs in vial transport medium  Result Value Ref Range Status   SARS Coronavirus 2 by RT PCR NEGATIVE NEGATIVE Final    Comment: (NOTE) SARS-CoV-2 target nucleic acids are NOT DETECTED.  The SARS-CoV-2 RNA is generally detectable in upper respiratory specimens during the acute phase of infection. The lowest concentration of SARS-CoV-2 viral copies this assay can detect is 138 copies/mL. A negative result does not preclude SARS-Cov-2 infection and should not be used as the sole basis for treatment or other patient management decisions. A negative result may occur with  improper specimen collection/handling, submission of specimen other than nasopharyngeal swab, presence of viral mutation(s) within the areas targeted by this assay, and inadequate number of viral copies(<138 copies/mL). A negative result must be combined with clinical observations, patient history, and epidemiological information. The expected result is Negative.  Fact Sheet for Patients:  EntrepreneurPulse.com.au  Fact Sheet for Healthcare Providers:  IncredibleEmployment.be  This test is no t yet approved or cleared by the [place] FDA and  has been authorized for detection and/or diagnosis of SARS-CoV-2 by FDA under an Emergency Use Authorization (EUA). This EUA will remain  in effect (meaning this test can be used) for the duration of the COVID-19 declaration under Section 564(b)(1) of the Act, 21 U.S.C.section 360bbb-3(b)(1), unless the authorization is terminated  or revoked sooner.       Influenza A by PCR NEGATIVE NEGATIVE Final   Influenza B by PCR NEGATIVE NEGATIVE Final    Comment: (NOTE) The Xpert Xpress SARS-CoV-2/FLU/RSV plus assay is intended as an aid in the diagnosis of influenza from Nasopharyngeal swab specimens and should not be  used as a sole basis for treatment. Nasal washings and aspirates are unacceptable for Xpert Xpress SARS-CoV-2/FLU/RSV testing.  Fact Sheet for Patients: EntrepreneurPulse.com.au  Fact Sheet for  Healthcare Providers: IncredibleEmployment.be  This test is not yet approved or cleared by the [place] and has been authorized for detection and/or diagnosis of SARS-CoV-2 by FDA under an Emergency Use Authorization (EUA). This EUA will remain in effect (meaning this test can be used) for the duration of the COVID-19 declaration under Section 564(b)(1) of the Act, 21 U.S.C. section 360bbb-3(b)(1), unless the authorization is terminated or revoked.  Performed at [facility], [address], [place], [place] [address]   Urine Culture     Status: Abnormal   Collection Time: 08/02/20  9:47 AM   Specimen: Urine, Clean Catch  Result Value Ref Range Status   Specimen Description   Final    URINE, CLEAN CATCH Performed at [facility], [address] [place], [place] [address]    Special Requests   Final    Normal Performed at [facility], [address] [place], [place] [address]    Culture 90,000 COLONIES/mL ESCHERICHIA COLI (A)  Final   Report Status 08/05/2020 FINAL  Final   Organism ID, Bacteria ESCHERICHIA COLI (A)  Final      Susceptibility   Escherichia coli - MIC*    AMPICILLIN 4 SENSITIVE Sensitive     CEFAZOLIN <=4 SENSITIVE Sensitive     CEFEPIME <=0.12 SENSITIVE Sensitive     CEFTRIAXONE <=0.25 SENSITIVE Sensitive     CIPROFLOXACIN >=4 RESISTANT Resistant     GENTAMICIN <=1 SENSITIVE Sensitive     IMIPENEM <=0.25 SENSITIVE Sensitive     NITROFURANTOIN <=16 SENSITIVE Sensitive     TRIMETH/SULFA <=20 SENSITIVE Sensitive     AMPICILLIN/SULBACTAM <=2 SENSITIVE Sensitive     PIP/TAZO <=4 SENSITIVE Sensitive     * 90,000 COLONIES/mL ESCHERICHIA COLI  Aerobic/Anaerobic Culture w Gram Stain (surgical/deep wound)     Status: None    Collection Time: 08/03/20  2:29 PM   Specimen: Tissue  Result Value Ref Range Status   Specimen Description TISSUE  Final   Special Requests BIOFILM ABDOMEN  Final   Gram Stain   Final    ABUNDANT WBC PRESENT, PREDOMINANTLY PMN FEW GRAM VARIABLE ROD RARE GRAM POSITIVE COCCI    Culture   Final    MODERATE ESCHERICHIA COLI FEW BACTEROIDES THETAIOTAOMICRON BETA LACTAMASE POSITIVE Performed at [facility], [place] [address] [place], [place] [address]    Report Status 08/08/2020 FINAL  Final   Organism ID, Bacteria ESCHERICHIA COLI  Final      Susceptibility   Escherichia coli - MIC*    AMPICILLIN 16 INTERMEDIATE Intermediate     CEFAZOLIN <=4 SENSITIVE Sensitive     CEFEPIME <=0.12 SENSITIVE Sensitive     CEFTAZIDIME <=1 SENSITIVE Sensitive     CEFTRIAXONE <=0.25 SENSITIVE Sensitive     CIPROFLOXACIN >=4 RESISTANT Resistant     GENTAMICIN <=1 SENSITIVE Sensitive     IMIPENEM <=0.25 SENSITIVE Sensitive     TRIMETH/SULFA >=320 RESISTANT Resistant     AMPICILLIN/SULBACTAM <=2 SENSITIVE Sensitive     PIP/TAZO <=4 SENSITIVE Sensitive     * MODERATE ESCHERICHIA COLI     Time coordinating discharge: 35 minutes  SIGNED:   Rodena Goldmann, DO [facility] 08/10/2020, 9:18 AM  If 7PM-7AM, please contact night-coverage [email or web address]

## 2020-08-10 NOTE — TOC Transition Note (Signed)
Transition of Care [facility]) - CM/SW Discharge Note   Patient Details  Name: Crystal [name] MRN: [id number] Date of Birth: 1934/05/23  Transition of Care [facility]) CM/SW Contact:  Natasha Bence, LCSW Phone Number: 08/10/2020, 10:27 AM   Clinical Narrative:    CSW notified of patient's readiness for discharge. CSW notified Vaughan Basta with [facility]. Vaughan Basta agreeable to provide [facility] services. TOC signing off.   Final next level of care: [place] Barriers to Discharge: Barriers Resolved   Patient Goals and CMS Choice Patient states their goals for this hospitalization and ongoing recovery are:: Return home with [facility] CMS Medicare.gov Compare Post Acute Care list provided to:: Patient Choice offered to / list presented to : Patient  Discharge Placement                    Patient and family notified of of transfer: 08/10/20  Discharge Plan and Services                          HH Arranged: RN, PT [facility] Agency: [place] ([place]) Date [place]: 08/10/20 Time [place]: 1027 Representative spoke with at [place]: [place] ([place]) Interventions     Readmission Risk Interventions Readmission Risk Prevention Plan 08/08/2020  Transportation Screening Complete  Medication Review Press photographer) Complete  HRI or [place] Complete  SW Recovery Care/Counseling Consult Complete  [place] Not Applicable  Some recent data might be hidden

## 2020-08-13 DIAGNOSIS — Z9011 Acquired absence of right breast and nipple: Secondary | ICD-10-CM | POA: Diagnosis not present

## 2020-08-13 DIAGNOSIS — I480 Paroxysmal atrial fibrillation: Secondary | ICD-10-CM | POA: Diagnosis not present

## 2020-08-13 DIAGNOSIS — I130 Hypertensive heart and chronic kidney disease with heart failure and stage 1 through stage 4 chronic kidney disease, or unspecified chronic kidney disease: Secondary | ICD-10-CM | POA: Diagnosis not present

## 2020-08-13 DIAGNOSIS — N179 Acute kidney failure, unspecified: Secondary | ICD-10-CM | POA: Diagnosis not present

## 2020-08-13 DIAGNOSIS — I700 Atherosclerosis of aorta: Secondary | ICD-10-CM | POA: Diagnosis not present

## 2020-08-13 DIAGNOSIS — D509 Iron deficiency anemia, unspecified: Secondary | ICD-10-CM | POA: Diagnosis not present

## 2020-08-13 DIAGNOSIS — Z9049 Acquired absence of other specified parts of digestive tract: Secondary | ICD-10-CM | POA: Diagnosis not present

## 2020-08-13 DIAGNOSIS — N184 Chronic kidney disease, stage 4 (severe): Secondary | ICD-10-CM | POA: Diagnosis not present

## 2020-08-13 DIAGNOSIS — E871 Hypo-osmolality and hyponatremia: Secondary | ICD-10-CM | POA: Diagnosis not present

## 2020-08-13 DIAGNOSIS — Z853 Personal history of malignant neoplasm of breast: Secondary | ICD-10-CM | POA: Diagnosis not present

## 2020-08-13 DIAGNOSIS — G510 Bell's palsy: Secondary | ICD-10-CM | POA: Diagnosis not present

## 2020-08-13 DIAGNOSIS — K219 Gastro-esophageal reflux disease without esophagitis: Secondary | ICD-10-CM | POA: Diagnosis not present

## 2020-08-13 DIAGNOSIS — E1122 Type 2 diabetes mellitus with diabetic chronic kidney disease: Secondary | ICD-10-CM | POA: Diagnosis not present

## 2020-08-13 DIAGNOSIS — E782 Mixed hyperlipidemia: Secondary | ICD-10-CM | POA: Diagnosis not present

## 2020-08-13 DIAGNOSIS — K567 Ileus, unspecified: Secondary | ICD-10-CM | POA: Diagnosis not present

## 2020-08-13 DIAGNOSIS — Z9181 History of falling: Secondary | ICD-10-CM | POA: Diagnosis not present

## 2020-08-13 DIAGNOSIS — I252 Old myocardial infarction: Secondary | ICD-10-CM | POA: Diagnosis not present

## 2020-08-13 DIAGNOSIS — Z7901 Long term (current) use of anticoagulants: Secondary | ICD-10-CM | POA: Diagnosis not present

## 2020-08-13 DIAGNOSIS — I5023 Acute on chronic systolic (congestive) heart failure: Secondary | ICD-10-CM | POA: Diagnosis not present

## 2020-08-13 DIAGNOSIS — E876 Hypokalemia: Secondary | ICD-10-CM | POA: Diagnosis not present

## 2020-08-13 DIAGNOSIS — I2510 Atherosclerotic heart disease of native coronary artery without angina pectoris: Secondary | ICD-10-CM | POA: Diagnosis not present

## 2020-08-13 DIAGNOSIS — M1030 Gout due to renal impairment, unspecified site: Secondary | ICD-10-CM | POA: Diagnosis not present

## 2020-08-13 DIAGNOSIS — E041 Nontoxic single thyroid nodule: Secondary | ICD-10-CM | POA: Diagnosis not present

## 2020-08-13 DIAGNOSIS — M8580 Other specified disorders of bone density and structure, unspecified site: Secondary | ICD-10-CM | POA: Diagnosis not present

## 2020-08-13 DIAGNOSIS — Z48815 Encounter for surgical aftercare following surgery on the digestive system: Secondary | ICD-10-CM | POA: Diagnosis not present

## 2020-08-14 ENCOUNTER — Other Ambulatory Visit: Payer: Self-pay | Admitting: *Deleted

## 2020-08-14 DIAGNOSIS — I130 Hypertensive heart and chronic kidney disease with heart failure and stage 1 through stage 4 chronic kidney disease, or unspecified chronic kidney disease: Secondary | ICD-10-CM | POA: Diagnosis not present

## 2020-08-14 DIAGNOSIS — K567 Ileus, unspecified: Secondary | ICD-10-CM | POA: Diagnosis not present

## 2020-08-14 DIAGNOSIS — N184 Chronic kidney disease, stage 4 (severe): Secondary | ICD-10-CM | POA: Diagnosis not present

## 2020-08-14 DIAGNOSIS — I5023 Acute on chronic systolic (congestive) heart failure: Secondary | ICD-10-CM | POA: Diagnosis not present

## 2020-08-14 DIAGNOSIS — Z48815 Encounter for surgical aftercare following surgery on the digestive system: Secondary | ICD-10-CM | POA: Diagnosis not present

## 2020-08-14 DIAGNOSIS — E1122 Type 2 diabetes mellitus with diabetic chronic kidney disease: Secondary | ICD-10-CM | POA: Diagnosis not present

## 2020-08-15 ENCOUNTER — Other Ambulatory Visit: Payer: Self-pay | Admitting: Cardiology

## 2020-08-15 ENCOUNTER — Inpatient Hospital Stay (HOSPITAL_COMMUNITY): Payer: Medicare Other

## 2020-08-15 LAB — GLUCOSE, CAPILLARY
Glucose-Capillary: 131 mg/dL — ABNORMAL HIGH (ref 70–99)
Glucose-Capillary: 84 mg/dL (ref 70–99)

## 2020-08-16 DIAGNOSIS — I130 Hypertensive heart and chronic kidney disease with heart failure and stage 1 through stage 4 chronic kidney disease, or unspecified chronic kidney disease: Secondary | ICD-10-CM | POA: Diagnosis not present

## 2020-08-16 DIAGNOSIS — Z48815 Encounter for surgical aftercare following surgery on the digestive system: Secondary | ICD-10-CM | POA: Diagnosis not present

## 2020-08-16 DIAGNOSIS — N184 Chronic kidney disease, stage 4 (severe): Secondary | ICD-10-CM | POA: Diagnosis not present

## 2020-08-16 DIAGNOSIS — E1122 Type 2 diabetes mellitus with diabetic chronic kidney disease: Secondary | ICD-10-CM | POA: Diagnosis not present

## 2020-08-16 DIAGNOSIS — K567 Ileus, unspecified: Secondary | ICD-10-CM | POA: Diagnosis not present

## 2020-08-16 DIAGNOSIS — I5023 Acute on chronic systolic (congestive) heart failure: Secondary | ICD-10-CM | POA: Diagnosis not present

## 2020-08-16 NOTE — Patient Outreach (Addendum)
[place] [facility]) Care Management  [place]  08/16/2020   Crystal [name] 11-20-1934 [id number]  Subjective: Pt home from prolonged hospitalization for ileus, exploratory lap and removal of gangrinous appendix. She is home with her nephew and niece and law. They look out after her very wll and ensure that all her needs are met.  She has a scar and she is not to get it wet. She has been taking sponge baths. She denies any pain. No signs of infection.  Completed transition of care template. All looks well for her recovery.  Patient states she cannot afford her Eliquis.  Encounter Medications:  Outpatient Encounter Medications as of 08/14/2020  Medication Sig Note   acetaminophen (TYLENOL) 500 MG tablet Take 500 mg by mouth every 6 (six) hours as needed for headache (pain).    amLODipine (NORVASC) 2.5 MG tablet Take 1 tablet (2.5 mg total) by mouth daily.    carvedilol (COREG) 25 MG tablet Take 1 tablet (25 mg total) by mouth in the morning and at bedtime. Breakfast & supper    cholecalciferol (VITAMIN D3) 25 MCG (1000 UT) tablet Take 1,000 Units by mouth daily with breakfast.    diazepam (VALIUM) 2 MG tablet Take 2 mg by mouth 2 (two) times daily as needed (dizzy spells.).    ELIQUIS 2.5 MG TABS tablet TAKE (1) TABLET TWICE DAILY.    epoetin alfa (EPOGEN) 3000 UNIT/ML injection Inject into the skin.    ferrous sulfate 325 (65 FE) MG tablet Take 325 mg by mouth daily before lunch. 05/18/2020: HAS RESTARTED ON 05/18/20   hydrALAZINE (APRESOLINE) 100 MG tablet TAKE 1 TABLET EVERY 8 HOURS    isosorbide mononitrate (IMDUR) 120 MG 24 hr tablet TAKE 1 TABLET BY MOUTH AT BEDTIME.    loperamide (IMODIUM) 2 MG capsule Take 2-4 mg by mouth 4 (four) times daily as needed for diarrhea or loose stools.    meclizine (ANTIVERT) 25 MG tablet Take 25 mg by mouth 2 (two) times daily as needed for dizziness.    Multiple Vitamin (MULTIVITAMIN WITH MINERALS) TABS tablet Take 1 tablet by  mouth daily. Centrum Silver for Women    multivitamin-lutein (OCUVITE-LUTEIN) CAPS capsule Take 1 capsule by mouth daily before lunch.     nitroGLYCERIN (NITROSTAT) 0.4 MG SL tablet Place 1 tablet (0.4 mg total) under the tongue every 5 (five) minutes x 3 doses as needed for chest pain.    pantoprazole (PROTONIX) 40 MG tablet Take 1 tablet (40 mg total) by mouth daily. Take 30 minutes before breakfast    potassium chloride SA (KLOR-CON) 20 MEQ tablet Take 3 tablets (60 mEq total) by mouth daily.    rosuvastatin (CRESTOR) 5 MG tablet Take 0.5 tablets (2.5 mg total) by mouth every other day.    saccharomyces boulardii (FLORASTOR) 250 MG capsule Take 250 mg by mouth 2 (two) times daily as needed (diarrhea).    torsemide (DEMADEX) 20 MG tablet Take 2 tablets (40 mg total) by mouth daily.    [DISCONTINUED] amiodarone (PACERONE) 200 MG tablet Take 200 mg by mouth daily.    No facility-administered encounter medications on file as of 08/14/2020.    Functional Status:  In your present state of health, do you have any difficulty performing the following activities: 08/02/2020 04/12/2020  Hearing? N -  Vision? N -  Difficulty concentrating or making decisions? N -  Walking or climbing stairs? N -  Dressing or bathing? N -  Doing errands, shopping? N -  Preparing Food and eating ? - N  Comment - Late entry for 02/03/20. CCS  Using the Toilet? - N  Comment - Late entry for 02/03/20. CCS  In the past six months, have you accidently leaked urine? - Y  Comment - Late entry for 02/03/20. CCS  Do you have problems with loss of bowel control? - N  Comment - Late entry for 02/03/20. CCS  Managing your Medications? - N  Comment - Late entry for 02/03/20. CCS  Managing your Finances? - N  Comment - Late entry for 02/03/20. CCS  Housekeeping or managing your Housekeeping? - Y  Comment - Late entry for 02/03/20. CCS  Some recent data might be hidden    Fall/Depression Screening: Fall Risk  04/12/2020 03/04/2019  Falls  in the past year? 0 0  Comment Late entry for 02/03/20. CCS -  Number falls in past yr: 0 -  Comment Late entry for 02/03/20. CCS -  Injury with Fall? 0 -  Comment Late entry for 02/03/20. CCS -  Risk for fall due to : Medication side effect -  Risk for fall due to: Comment Late entry for 02/03/20. CCS -  Follow up Falls evaluation completed -  Comment Late entry for 02/03/20. CCS -   PHQ 2/9 Scores 05/01/2020 04/12/2020 03/04/2019  PHQ - 2 Score 0 2 0  PHQ- 9 Score - 4 -    Assessment: S/P appendicitis  Plan: Referring to pharmacy staff for Eliquis medication assistance. Follow-up: Follow-up in 1 week(s)  Kayleen Memos C. [name], MSN, [facility] Gerontological Nurse Practitioner [facility] Care Management [phone]

## 2020-08-17 DIAGNOSIS — D638 Anemia in other chronic diseases classified elsewhere: Secondary | ICD-10-CM | POA: Diagnosis not present

## 2020-08-17 DIAGNOSIS — E1122 Type 2 diabetes mellitus with diabetic chronic kidney disease: Secondary | ICD-10-CM | POA: Diagnosis not present

## 2020-08-17 DIAGNOSIS — N189 Chronic kidney disease, unspecified: Secondary | ICD-10-CM | POA: Diagnosis not present

## 2020-08-17 DIAGNOSIS — I5033 Acute on chronic diastolic (congestive) heart failure: Secondary | ICD-10-CM | POA: Diagnosis not present

## 2020-08-17 DIAGNOSIS — E871 Hypo-osmolality and hyponatremia: Secondary | ICD-10-CM | POA: Diagnosis not present

## 2020-08-17 DIAGNOSIS — N170 Acute kidney failure with tubular necrosis: Secondary | ICD-10-CM | POA: Diagnosis not present

## 2020-08-17 NOTE — Patient Outreach (Signed)
[name]) Care Management  08/17/2020  Crystal [name] Jan 12, 1935 [id number]   Referral for medication assistance from Deloria Lair, NP sent to [facility] Management Assistant [phone]

## 2020-08-17 NOTE — Addendum Note (Signed)
Addended by: Deloria Lair on: 08/17/2020 09:54 AM   Modules accepted: Orders

## 2020-08-20 ENCOUNTER — Other Ambulatory Visit: Payer: Self-pay | Admitting: *Deleted

## 2020-08-20 DIAGNOSIS — E1122 Type 2 diabetes mellitus with diabetic chronic kidney disease: Secondary | ICD-10-CM | POA: Diagnosis not present

## 2020-08-20 DIAGNOSIS — I5023 Acute on chronic systolic (congestive) heart failure: Secondary | ICD-10-CM | POA: Diagnosis not present

## 2020-08-20 DIAGNOSIS — K567 Ileus, unspecified: Secondary | ICD-10-CM | POA: Diagnosis not present

## 2020-08-20 DIAGNOSIS — I130 Hypertensive heart and chronic kidney disease with heart failure and stage 1 through stage 4 chronic kidney disease, or unspecified chronic kidney disease: Secondary | ICD-10-CM | POA: Diagnosis not present

## 2020-08-20 DIAGNOSIS — N184 Chronic kidney disease, stage 4 (severe): Secondary | ICD-10-CM | POA: Diagnosis not present

## 2020-08-20 DIAGNOSIS — Z48815 Encounter for surgical aftercare following surgery on the digestive system: Secondary | ICD-10-CM | POA: Diagnosis not present

## 2020-08-20 NOTE — Patient Outreach (Signed)
[place] [facility]) Care Management  [place]  08/20/2020   Crystal [name] [birth year] [id number]  Subjective: Second follow up post hospitalization for ileus and appendectomy. Pt reports she has very little abd discomfort. She is getting her appetite back and she is moving her bowels daily. Yesterday and today bowel have been loose, 3 BMs today.  Encounter Medications:  Outpatient Encounter Medications as of 08/20/2020  Medication Sig Note   acetaminophen (TYLENOL) 500 MG tablet Take 500 mg by mouth every 6 (six) hours as needed for headache (pain).    amiodarone (PACERONE) 200 MG tablet TAKE 2 TABLETS TWICE A DAY UNTIL 2/13 THEN REDUCE TO 1 TABLET DAILY.    amLODipine (NORVASC) 2.5 MG tablet Take 1 tablet (2.5 mg total) by mouth daily.    carvedilol (COREG) 25 MG tablet Take 1 tablet (25 mg total) by mouth in the morning and at bedtime. Breakfast & supper    cholecalciferol (VITAMIN D3) 25 MCG (1000 UT) tablet Take 1,000 Units by mouth daily with breakfast.    diazepam (VALIUM) 2 MG tablet Take 2 mg by mouth 2 (two) times daily as needed (dizzy spells.).    ELIQUIS 2.5 MG TABS tablet TAKE (1) TABLET TWICE DAILY.    epoetin alfa (EPOGEN) 3000 UNIT/ML injection Inject into the skin.    ferrous sulfate 325 (65 FE) MG tablet Take 325 mg by mouth daily before lunch. 05/18/2020: HAS RESTARTED ON 05/18/20   hydrALAZINE (APRESOLINE) 100 MG tablet TAKE 1 TABLET EVERY 8 HOURS    isosorbide mononitrate (IMDUR) 120 MG 24 hr tablet TAKE 1 TABLET BY MOUTH AT BEDTIME.    loperamide (IMODIUM) 2 MG capsule Take 2-4 mg by mouth 4 (four) times daily as needed for diarrhea or loose stools.    meclizine (ANTIVERT) 25 MG tablet Take 25 mg by mouth 2 (two) times daily as needed for dizziness.    Multiple Vitamin (MULTIVITAMIN WITH MINERALS) TABS tablet Take 1 tablet by mouth daily. Centrum Silver for Women    multivitamin-lutein (OCUVITE-LUTEIN) CAPS capsule Take 1 capsule by mouth daily  before lunch.     nitroGLYCERIN (NITROSTAT) 0.4 MG SL tablet Place 1 tablet (0.4 mg total) under the tongue every 5 (five) minutes x 3 doses as needed for chest pain.    pantoprazole (PROTONIX) 40 MG tablet Take 1 tablet (40 mg total) by mouth daily. Take 30 minutes before breakfast    potassium chloride SA (KLOR-CON) 20 MEQ tablet Take 3 tablets (60 mEq total) by mouth daily.    rosuvastatin (CRESTOR) 5 MG tablet Take 0.5 tablets (2.5 mg total) by mouth every other day.    saccharomyces boulardii (FLORASTOR) 250 MG capsule Take 250 mg by mouth 2 (two) times daily as needed (diarrhea).    torsemide (DEMADEX) 20 MG tablet Take 2 tablets (40 mg total) by mouth daily.    No facility-administered encounter medications on file as of 08/20/2020.    Functional Status:  In your present state of health, do you have any difficulty performing the following activities: 08/02/2020 04/12/2020  Hearing? N -  Vision? N -  Difficulty concentrating or making decisions? N -  Walking or climbing stairs? N -  Dressing or bathing? N -  Doing errands, shopping? N -  Preparing Food and eating ? - N  Comment - Late entry for 02/03/20. CCS  Using the Toilet? - N  Comment - Late entry for 02/03/20. CCS  In the past six months, have you accidently leaked  urine? - Y  Comment - Late entry for 02/03/20. CCS  Do you have problems with loss of bowel control? - N  Comment - Late entry for 02/03/20. CCS  Managing your Medications? - N  Comment - Late entry for 02/03/20. CCS  Managing your Finances? - N  Comment - Late entry for 02/03/20. CCS  Housekeeping or managing your Housekeeping? - Y  Comment - Late entry for 02/03/20. CCS  Some recent data might be hidden    Fall/Depression Screening: Fall Risk  04/12/2020 03/04/2019  Falls in the past year? 0 0  Comment Late entry for 02/03/20. CCS -  Number falls in past yr: 0 -  Comment Late entry for 02/03/20. CCS -  Injury with Fall? 0 -  Comment Late entry for 02/03/20. CCS -  Risk for  fall due to : Medication side effect -  Risk for fall due to: Comment Late entry for 02/03/20. CCS -  Follow up Falls evaluation completed -  Comment Late entry for 02/03/20. CCS -   PHQ 2/9 Scores 05/01/2020 04/12/2020 03/04/2019  PHQ - 2 Score 0 2 0  PHQ- 9 Score - 4 -    Assessment: Post exloratory lap and appendenctomy                        CHF                        CKD                        CAD  Care Plan   Goals Addressed               This Visit's Progress     Patient Stated     [facility]) Patient Stated (pt-stated)   On track     Pt will call MD or NP if has increasing sxs HF (wt. Gain, wheezing, SOB, Edema) to overt an ED visit over the next 3 months.  Start date 10/23/20, renewed on 03/07/20., renewed 06/18/20  High Priority Long term goal Expected end date: Follow up 09/25/20  Barriers: Health Behaviors Knowledge  03/07/20 Pt has followed her goal but had to go to the ED and be hospitalized for subsequent MI. Reinforced HF and CAD Action plan to weigh daily, take meds, activity to tolerance, go to appts and report any heart sxs early to MD to prevent complications and subsequent trips to the hospital. 03/14/20 Following HF Action plan. Wt is stable, no signs of HF. Pt acknowledges she knows when to call for advice. She will have video visit with cardiology tomorrow She feels she is making progress, striving to get her independence back. She feels she is less SOB and it is evident in talking with her, she is less SOB when talking. Encouraged her to give all her effort in her therapy sessions to progress and to be safe. 03/21/20 No acute probems, progressing nicely. Encouraged continued efforts, attending appts, following medical recommendations. Celebrated that she is happy with her progress and that she IS getting better. 03/28/20 Pt reports continueous improvements in her phycial recovery. No HF exacerbating sxs reported. Knows when to call for help. Reinforced Action Plan. 04/04/20 Wt  stable between 127-129#, no SOB and minimal edema. General overall health improvement. Praised for her continued self managed and encouraged to continue this life long regimen. 05/03/20 Wt 129#. Has some SOB on exertion  but note her Hgb has fallen to 7.0! She is being worked up by hematology, has had a bone scan. She has had a couple of dark stools and has reported this to her GI provider. Dr. Cyndi Bender is trying to get her in the office ASAP. She has moved back to her own home. She is able to get around cautiously using her walker. They kids are checking in on her daily. She carries her cell phone with her at all times to call for help if she needs to. 05/18/20 Has not needed to call for any new significant sxs. Today's wt was 130#. She saw Dr. Domenic Polite this week and he did increase her demadex dose to 40 mg for 3 days the to alternating days of 20 mg and 40 mg. Her niece is assisting her with medication organization for bid dosing. This works very well for her. She will f/u with him in May. Her hgb is up to 8.1, steadily increasing. She has resumed her eliquis and iron supplementation. She has seen her GI specialist. 06/18/20 Today's wt is 120# (10# wt loss) Demadex has been increased to 40 mg daily continuously now. Reinforced low salt diet, especially when eating out. Ask for low Na and heart healthy choices. Dr. Domenic Polite took her off of Plavix only on Eliquis now. 07/19/20 Wt is 122.3, no SOB, does have some edema but this resolves during the night. She continues to take 40 mg of toresemide daily. Her potassium was increased to 60 meq in May when her potassium level was 3.7 and has not been rechecked. Will send Dr. Theador Hawthorne a note asking if he would like to get that rechecked before he sees her again 08/15/20. Reinforced to call if any problems arise. 08/16/20 Pt will need to resume her home self management of her HF. She denies SOB, edema. 08/20/20 Pt reports she has not started weighing again. She will ask her nephew to  get the scale out again for her so she can resume. She does report edema and she saw her nephrologist who advised she can take torsemide 60 mg now. Wt 134# in MD office. He advised that if she does not lose 5# this week to call him. Reminded her to tell her nephew she does not need any added salt to her food.      Other     Comorbidities Identified and Managed and documented with pt self management reported each month over the next 3 months.        Pt goal started: 04/04/20 Long term goal High priority Follow up 09/25/20 Expected completion; 07/26/20, Renewed for another 3 months 10/26/20. Barriers: Health Behaviors Knowledge     Notes: 04/04/20 Other comorbidities to monitor: DM, CKD, CAD. Chart review revealed Diabetes: Lipids were normal in January! HgbA1C was 6.0 in December! Does not check her glucose at home very often as this is well controlled. Reports she had a foot exam last fall, will have her eye exam: tomorrow.  CKD: labs ordered by nephrology, will get labs tomorrow and pick up jug for 24 hour urine collection. Next appt is last week of Marchl for follow up and discussion regarding lab results. No new CAD sxs and follow up with cardiology scheduled for May. 05/18/20 Labs recently done. CBC improving. Labs ordered by hematologist are mostly out of range. Dr. Leron Croak is following closely. Pt is getting iron infusions. Reviewed safety measures since pt is back at her home. She is getting around better all the time  and she is starting to drive again and feels confident. She is extremely cautious when ambulating and avoids taking risks like stepping up on a curb. She will go up the curb cutout. Reinforced safety as #1 and notifying MD of any problems early. 06/18/20 Pt is feeling the best she has felt, HF more stable, CKD also stable back to stage III. She is able to do her housework slowly as she tires easily. She says she cannot bend over and clean her bath tub. Suggested she use a mop and that  way she doesn't need to bend over. Her thyroid levels were recently checked and that is normal range. was 9.9.07/19/20 Saw GI and she is stable, no dx scheduled. 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 Advised she should hold pressure over areas she knows she has bumped to minimize the superficial bruising. Suggested we request an application for pharmacy assistance for the Eliquis and she is in agreement. Will refer. Encouraged to call for any issues for prompt intervention and to avoid complications. She did have an iron infusion yesterday as her hgb 08/16/20 New problem illeus, requiring exploratory lab and discovery of gangrenous appendix. Ileus resolved. Home now with nepthew. Able to eat soft foods, having formed stools. Will have surgical follow up. 08/20/20 CKD-had f/u with nephrologist 7/22, he increased torsemide to address wt gain, low na, HTN. Pt is not back on track with weighing daily since she came home from the hospital last week. Reinforced that is #1 self care action to resume and to advise MD or NP if wt increases 3-5#. Hgb low and she will resume her Epogen injections. Will see primary care via televisit this week, cardiology 09/08/20. GI follow up on          Plan: Transition of care call next week. Follow-up: Patient agrees to Care Plan and Follow-up. Follow-up in 1 week(s)  Kayleen Memos C. [name], MSN, [facility] Gerontological Nurse Practitioner [facility] Care Management [phone]

## 2020-08-21 DIAGNOSIS — K567 Ileus, unspecified: Secondary | ICD-10-CM | POA: Diagnosis not present

## 2020-08-21 DIAGNOSIS — Z299 Encounter for prophylactic measures, unspecified: Secondary | ICD-10-CM | POA: Diagnosis not present

## 2020-08-21 DIAGNOSIS — E1165 Type 2 diabetes mellitus with hyperglycemia: Secondary | ICD-10-CM | POA: Diagnosis not present

## 2020-08-21 DIAGNOSIS — E1122 Type 2 diabetes mellitus with diabetic chronic kidney disease: Secondary | ICD-10-CM | POA: Diagnosis not present

## 2020-08-21 DIAGNOSIS — I130 Hypertensive heart and chronic kidney disease with heart failure and stage 1 through stage 4 chronic kidney disease, or unspecified chronic kidney disease: Secondary | ICD-10-CM | POA: Diagnosis not present

## 2020-08-21 DIAGNOSIS — I5022 Chronic systolic (congestive) heart failure: Secondary | ICD-10-CM | POA: Diagnosis not present

## 2020-08-21 DIAGNOSIS — I10 Essential (primary) hypertension: Secondary | ICD-10-CM | POA: Diagnosis not present

## 2020-08-21 DIAGNOSIS — N184 Chronic kidney disease, stage 4 (severe): Secondary | ICD-10-CM | POA: Diagnosis not present

## 2020-08-21 DIAGNOSIS — Z48815 Encounter for surgical aftercare following surgery on the digestive system: Secondary | ICD-10-CM | POA: Diagnosis not present

## 2020-08-21 DIAGNOSIS — I5023 Acute on chronic systolic (congestive) heart failure: Secondary | ICD-10-CM | POA: Diagnosis not present

## 2020-08-21 DIAGNOSIS — Z09 Encounter for follow-up examination after completed treatment for conditions other than malignant neoplasm: Secondary | ICD-10-CM | POA: Diagnosis not present

## 2020-08-22 ENCOUNTER — Ambulatory Visit (HOSPITAL_COMMUNITY): Payer: Medicare Other | Admitting: Physician Assistant

## 2020-08-22 DIAGNOSIS — I5023 Acute on chronic systolic (congestive) heart failure: Secondary | ICD-10-CM | POA: Diagnosis not present

## 2020-08-22 DIAGNOSIS — I130 Hypertensive heart and chronic kidney disease with heart failure and stage 1 through stage 4 chronic kidney disease, or unspecified chronic kidney disease: Secondary | ICD-10-CM | POA: Diagnosis not present

## 2020-08-22 DIAGNOSIS — K567 Ileus, unspecified: Secondary | ICD-10-CM | POA: Diagnosis not present

## 2020-08-22 DIAGNOSIS — Z48815 Encounter for surgical aftercare following surgery on the digestive system: Secondary | ICD-10-CM | POA: Diagnosis not present

## 2020-08-23 DIAGNOSIS — I130 Hypertensive heart and chronic kidney disease with heart failure and stage 1 through stage 4 chronic kidney disease, or unspecified chronic kidney disease: Secondary | ICD-10-CM | POA: Diagnosis not present

## 2020-08-23 DIAGNOSIS — Z48815 Encounter for surgical aftercare following surgery on the digestive system: Secondary | ICD-10-CM | POA: Diagnosis not present

## 2020-08-23 DIAGNOSIS — I5023 Acute on chronic systolic (congestive) heart failure: Secondary | ICD-10-CM | POA: Diagnosis not present

## 2020-08-23 DIAGNOSIS — E1122 Type 2 diabetes mellitus with diabetic chronic kidney disease: Secondary | ICD-10-CM | POA: Diagnosis not present

## 2020-08-23 DIAGNOSIS — N184 Chronic kidney disease, stage 4 (severe): Secondary | ICD-10-CM | POA: Diagnosis not present

## 2020-08-23 DIAGNOSIS — K567 Ileus, unspecified: Secondary | ICD-10-CM | POA: Diagnosis not present

## 2020-08-24 DIAGNOSIS — I5023 Acute on chronic systolic (congestive) heart failure: Secondary | ICD-10-CM | POA: Diagnosis not present

## 2020-08-24 DIAGNOSIS — E1122 Type 2 diabetes mellitus with diabetic chronic kidney disease: Secondary | ICD-10-CM | POA: Diagnosis not present

## 2020-08-24 DIAGNOSIS — I130 Hypertensive heart and chronic kidney disease with heart failure and stage 1 through stage 4 chronic kidney disease, or unspecified chronic kidney disease: Secondary | ICD-10-CM | POA: Diagnosis not present

## 2020-08-24 DIAGNOSIS — Z48815 Encounter for surgical aftercare following surgery on the digestive system: Secondary | ICD-10-CM | POA: Diagnosis not present

## 2020-08-24 DIAGNOSIS — K567 Ileus, unspecified: Secondary | ICD-10-CM | POA: Diagnosis not present

## 2020-08-24 DIAGNOSIS — N184 Chronic kidney disease, stage 4 (severe): Secondary | ICD-10-CM | POA: Diagnosis not present

## 2020-08-27 ENCOUNTER — Other Ambulatory Visit: Payer: Self-pay | Admitting: *Deleted

## 2020-08-27 DIAGNOSIS — I5023 Acute on chronic systolic (congestive) heart failure: Secondary | ICD-10-CM | POA: Diagnosis not present

## 2020-08-27 DIAGNOSIS — I130 Hypertensive heart and chronic kidney disease with heart failure and stage 1 through stage 4 chronic kidney disease, or unspecified chronic kidney disease: Secondary | ICD-10-CM | POA: Diagnosis not present

## 2020-08-27 DIAGNOSIS — N184 Chronic kidney disease, stage 4 (severe): Secondary | ICD-10-CM | POA: Diagnosis not present

## 2020-08-27 DIAGNOSIS — Z48815 Encounter for surgical aftercare following surgery on the digestive system: Secondary | ICD-10-CM | POA: Diagnosis not present

## 2020-08-27 DIAGNOSIS — K567 Ileus, unspecified: Secondary | ICD-10-CM | POA: Diagnosis not present

## 2020-08-27 DIAGNOSIS — E1122 Type 2 diabetes mellitus with diabetic chronic kidney disease: Secondary | ICD-10-CM | POA: Diagnosis not present

## 2020-08-27 NOTE — Patient Outreach (Signed)
[place] [facility]) Care Management  08/27/2020  [place] 12-19-1934 [id number]  Telephone outreach, transition of care week 4 post ileus, , appendicitis , exploratory lap and removal of gangrenous appendix  Today, Mrs. Hanselman reports she has been nauseated this weekend and has vomited several times. She has only been able to eat a hand full of food Saturday and Sunday. She also reports diarrhea and no formed stool. She complains of abdominal tenderness put no frank pain. She denies abdominal bloating. She has been taking both ondansetron and imodium with minimal effect.  Have advised that she needs to call Dr.Vyas for a check up since she has these sxs and is post op. He will advise if she should come in or not. Also encouraged drinking fluids as much as possible. When she is able to take meds she should eat something before taking the iron which may reduce her stomach upset.  NP to call her again tomorrow to see how she is. Will send this note to Dr. Chauncey Reading C. [name], MSN, [facility] Gerontological Nurse Practitioner [facility] Care Management [phone]

## 2020-08-28 ENCOUNTER — Other Ambulatory Visit: Payer: Self-pay | Admitting: *Deleted

## 2020-08-28 DIAGNOSIS — I5023 Acute on chronic systolic (congestive) heart failure: Secondary | ICD-10-CM | POA: Diagnosis not present

## 2020-08-28 DIAGNOSIS — N184 Chronic kidney disease, stage 4 (severe): Secondary | ICD-10-CM | POA: Diagnosis not present

## 2020-08-28 DIAGNOSIS — K567 Ileus, unspecified: Secondary | ICD-10-CM | POA: Diagnosis not present

## 2020-08-28 DIAGNOSIS — Z48815 Encounter for surgical aftercare following surgery on the digestive system: Secondary | ICD-10-CM | POA: Diagnosis not present

## 2020-08-28 DIAGNOSIS — E1122 Type 2 diabetes mellitus with diabetic chronic kidney disease: Secondary | ICD-10-CM | POA: Diagnosis not present

## 2020-08-28 DIAGNOSIS — I130 Hypertensive heart and chronic kidney disease with heart failure and stage 1 through stage 4 chronic kidney disease, or unspecified chronic kidney disease: Secondary | ICD-10-CM | POA: Diagnosis not present

## 2020-08-28 NOTE — Patient Outreach (Signed)
[facility]) Care Management  08/28/2020  Chrystine Frogge Medico 04-Nov-1934 [id number]  Telephone outreach to follow up on pt c/o nausea and diarrhea, abd tenderness. Post abdominal surgery 08/03/20.  Mrs. Mcfarren reports she feels somewhat better after taking Pepto-bismol. She did call Dr. Woody Seller office and they called pt GI provider whom she has not heard back from yet.  Reviewed why NP was concerned and that precaution is best to avoid complications.  Pt also reports she fell in the bathroom last night reaching for her walker. She says she didn't hurt herself but she couldn't get up. Luckily, her nephew's wife was in the house and came to her aid. She has to call her husband to come assist to get her up.  We discussed the necessity to ALWAYS have her cell phone with her to call for help in case she is in the house alone. Precautioned that elderly people who fall and can't get up and are down for hours have bad outcomes. Reinforced SAFETY precautions.  NP to call again next week for follow up. Pt agrees.  [name]. [name], MSN, [facility] Gerontological Nurse Practitioner [facility] Care Management [phone]

## 2020-08-29 ENCOUNTER — Encounter (HOSPITAL_COMMUNITY): Admission: RE | Admit: 2020-08-29 | Payer: Medicare Other | Source: Ambulatory Visit

## 2020-08-29 DIAGNOSIS — N184 Chronic kidney disease, stage 4 (severe): Secondary | ICD-10-CM | POA: Diagnosis not present

## 2020-08-29 DIAGNOSIS — I5023 Acute on chronic systolic (congestive) heart failure: Secondary | ICD-10-CM | POA: Diagnosis not present

## 2020-08-29 DIAGNOSIS — Z48815 Encounter for surgical aftercare following surgery on the digestive system: Secondary | ICD-10-CM | POA: Diagnosis not present

## 2020-08-29 DIAGNOSIS — E1122 Type 2 diabetes mellitus with diabetic chronic kidney disease: Secondary | ICD-10-CM | POA: Diagnosis not present

## 2020-08-29 DIAGNOSIS — K567 Ileus, unspecified: Secondary | ICD-10-CM | POA: Diagnosis not present

## 2020-08-29 DIAGNOSIS — I130 Hypertensive heart and chronic kidney disease with heart failure and stage 1 through stage 4 chronic kidney disease, or unspecified chronic kidney disease: Secondary | ICD-10-CM | POA: Diagnosis not present

## 2020-08-30 DIAGNOSIS — Z48815 Encounter for surgical aftercare following surgery on the digestive system: Secondary | ICD-10-CM | POA: Diagnosis not present

## 2020-08-30 DIAGNOSIS — K567 Ileus, unspecified: Secondary | ICD-10-CM | POA: Diagnosis not present

## 2020-08-30 DIAGNOSIS — E1122 Type 2 diabetes mellitus with diabetic chronic kidney disease: Secondary | ICD-10-CM | POA: Diagnosis not present

## 2020-08-30 DIAGNOSIS — N184 Chronic kidney disease, stage 4 (severe): Secondary | ICD-10-CM | POA: Diagnosis not present

## 2020-08-30 DIAGNOSIS — A0472 Enterocolitis due to Clostridium difficile, not specified as recurrent: Secondary | ICD-10-CM | POA: Diagnosis not present

## 2020-08-30 DIAGNOSIS — I130 Hypertensive heart and chronic kidney disease with heart failure and stage 1 through stage 4 chronic kidney disease, or unspecified chronic kidney disease: Secondary | ICD-10-CM | POA: Diagnosis not present

## 2020-08-30 DIAGNOSIS — I5023 Acute on chronic systolic (congestive) heart failure: Secondary | ICD-10-CM | POA: Diagnosis not present

## 2020-08-31 DIAGNOSIS — D649 Anemia, unspecified: Secondary | ICD-10-CM | POA: Diagnosis not present

## 2020-09-03 ENCOUNTER — Other Ambulatory Visit: Payer: Self-pay | Admitting: *Deleted

## 2020-09-03 DIAGNOSIS — E1122 Type 2 diabetes mellitus with diabetic chronic kidney disease: Secondary | ICD-10-CM | POA: Diagnosis not present

## 2020-09-03 DIAGNOSIS — N184 Chronic kidney disease, stage 4 (severe): Secondary | ICD-10-CM | POA: Diagnosis not present

## 2020-09-03 DIAGNOSIS — K567 Ileus, unspecified: Secondary | ICD-10-CM | POA: Diagnosis not present

## 2020-09-03 DIAGNOSIS — I5023 Acute on chronic systolic (congestive) heart failure: Secondary | ICD-10-CM | POA: Diagnosis not present

## 2020-09-03 DIAGNOSIS — Z48815 Encounter for surgical aftercare following surgery on the digestive system: Secondary | ICD-10-CM | POA: Diagnosis not present

## 2020-09-03 DIAGNOSIS — I130 Hypertensive heart and chronic kidney disease with heart failure and stage 1 through stage 4 chronic kidney disease, or unspecified chronic kidney disease: Secondary | ICD-10-CM | POA: Diagnosis not present

## 2020-09-03 NOTE — Patient Outreach (Signed)
[facility]) Care Management  [place]  09/03/2020   Crystal [name] 11/24/1934 [id number]  Subjective:  Last transition of care call, following up from hospitalization for ileus and subsequent abdominal surgery for gangrenous appendix.  Encounter Medications:  Outpatient Encounter Medications as of 09/03/2020  Medication Sig Note   acetaminophen (TYLENOL) 500 MG tablet Take 500 mg by mouth every 6 (six) hours as needed for headache (pain).    amiodarone (PACERONE) 200 MG tablet TAKE 2 TABLETS TWICE A DAY UNTIL 2/13 THEN REDUCE TO 1 TABLET DAILY.    amLODipine (NORVASC) 2.5 MG tablet Take 1 tablet (2.5 mg total) by mouth daily.    carvedilol (COREG) 25 MG tablet Take 1 tablet (25 mg total) by mouth in the morning and at bedtime. Breakfast & supper    cholecalciferol (VITAMIN D3) 25 MCG (1000 UT) tablet Take 1,000 Units by mouth daily with breakfast.    diazepam (VALIUM) 2 MG tablet Take 2 mg by mouth 2 (two) times daily as needed (dizzy spells.).    ELIQUIS 2.5 MG TABS tablet TAKE (1) TABLET TWICE DAILY.    epoetin alfa (EPOGEN) 3000 UNIT/ML injection Inject into the skin.    ferrous sulfate 325 (65 FE) MG tablet Take 325 mg by mouth daily before lunch. 05/18/2020: HAS RESTARTED ON 05/18/20   hydrALAZINE (APRESOLINE) 100 MG tablet TAKE 1 TABLET EVERY 8 HOURS    isosorbide mononitrate (IMDUR) 120 MG 24 hr tablet TAKE 1 TABLET BY MOUTH AT BEDTIME.    loperamide (IMODIUM) 2 MG capsule Take 2-4 mg by mouth 4 (four) times daily as needed for diarrhea or loose stools.    meclizine (ANTIVERT) 25 MG tablet Take 25 mg by mouth 2 (two) times daily as needed for dizziness.    Multiple Vitamin (MULTIVITAMIN WITH MINERALS) TABS tablet Take 1 tablet by mouth daily. Centrum Silver for Women    multivitamin-lutein (OCUVITE-LUTEIN) CAPS capsule Take 1 capsule by mouth daily before lunch.     nitroGLYCERIN (NITROSTAT) 0.4 MG SL tablet Place 1 tablet (0.4 mg total) under the tongue  every 5 (five) minutes x 3 doses as needed for chest pain.    pantoprazole (PROTONIX) 40 MG tablet Take 1 tablet (40 mg total) by mouth daily. Take 30 minutes before breakfast    potassium chloride SA (KLOR-CON) 20 MEQ tablet Take 3 tablets (60 mEq total) by mouth daily.    rosuvastatin (CRESTOR) 5 MG tablet Take 0.5 tablets (2.5 mg total) by mouth every other day.    saccharomyces boulardii (FLORASTOR) 250 MG capsule Take 250 mg by mouth 2 (two) times daily as needed (diarrhea).    torsemide (DEMADEX) 20 MG tablet Take 2 tablets (40 mg total) by mouth daily.    No facility-administered encounter medications on file as of 09/03/2020.    Functional Status:  In your present state of health, do you have any difficulty performing the following activities: 08/02/2020 04/12/2020  Hearing? N -  Vision? N -  Difficulty concentrating or making decisions? N -  Walking or climbing stairs? N -  Dressing or bathing? N -  Doing errands, shopping? N -  Preparing Food and eating ? - N  Comment - Late entry for 02/03/20. CCS  Using the Toilet? - N  Comment - Late entry for 02/03/20. CCS  In the past six months, have you accidently leaked urine? - Y  Comment - Late entry for 02/03/20. CCS  Do you have problems with loss of bowel control? -  N  Comment - Late entry for 02/03/20. CCS  Managing your Medications? - N  Comment - Late entry for 02/03/20. CCS  Managing your Finances? - N  Comment - Late entry for 02/03/20. CCS  Housekeeping or managing your Housekeeping? - Y  Comment - Late entry for 02/03/20. CCS  Some recent data might be hidden    Fall/Depression Screening: Fall Risk  04/12/2020 03/04/2019  Falls in the past year? 0 0  Comment Late entry for 02/03/20. CCS -  Number falls in past yr: 0 -  Comment Late entry for 02/03/20. CCS -  Injury with Fall? 0 -  Comment Late entry for 02/03/20. CCS -  Risk for fall due to : Medication side effect -  Risk for fall due to: Comment Late entry for 02/03/20. CCS -  Follow up  Falls evaluation completed -  Comment Late entry for 02/03/20. CCS -   PHQ 2/9 Scores 05/01/2020 04/12/2020 03/04/2019  PHQ - 2 Score 0 2 0  PHQ- 9 Score - 4 -    Assessment:  Anemia                          HF  Care Plan There are no care plans that you recently modified to display for this patient.    Goals Addressed               This Visit's Progress     Patient Stated     [facility]) Patient Stated (pt-stated)        Pt will call MD or NP if has increasing sxs HF (wt. Gain, wheezing, SOB, Edema) to overt an ED visit over the next 3 months.  Start date 10/23/20, renewed on 03/07/20., renewed 06/18/20  High Priority Long term goal Expected end date: Follow up 09/25/20  Barriers: Health Behaviors Knowledge  03/07/20 Pt has followed her goal but had to go to the ED and be hospitalized for subsequent MI. Reinforced HF and CAD Action plan to weigh daily, take meds, activity to tolerance, go to appts and report any heart sxs early to MD to prevent complications and subsequent trips to the hospital. 03/14/20 Following HF Action plan. Wt is stable, no signs of HF. Pt acknowledges she knows when to call for advice. She will have video visit with cardiology tomorrow She feels she is making progress, striving to get her independence back. She feels she is less SOB and it is evident in talking with her, she is less SOB when talking. Encouraged her to give all her effort in her therapy sessions to progress and to be safe. 03/21/20 No acute probems, progressing nicely. Encouraged continued efforts, attending appts, following medical recommendations. Celebrated that she is happy with her progress and that she IS getting better. 03/28/20 Pt reports continueous improvements in her phycial recovery. No HF exacerbating sxs reported. Knows when to call for help. Reinforced Action Plan. 04/04/20 Wt stable between 127-129#, no SOB and minimal edema. General overall health improvement. Praised for her continued self managed  and encouraged to continue this life long regimen. 05/03/20 Wt 129#. Has some SOB on exertion but note her Hgb has fallen to 7.0! She is being worked up by hematology, has had a bone scan. She has had a couple of dark stools and has reported this to her GI provider. Dr. Cyndi Bender is trying to get her in the office ASAP. She has moved back to her own home. She is  able to get around cautiously using her walker. They kids are checking in on her daily. She carries her cell phone with her at all times to call for help if she needs to. 05/18/20 Has not needed to call for any new significant sxs. Today's wt was 130#. She saw Dr. Domenic Polite this week and he did increase her demadex dose to 40 mg for 3 days the to alternating days of 20 mg and 40 mg. Her niece is assisting her with medication organization for bid dosing. This works very well for her. She will f/u with him in May. Her hgb is up to 8.1, steadily increasing. She has resumed her eliquis and iron supplementation. She has seen her GI specialist. 06/18/20 Today's wt is 120# (10# wt loss) Demadex has been increased to 40 mg daily continuously now. Reinforced low salt diet, especially when eating out. Ask for low Na and heart healthy choices. Dr. Domenic Polite took her off of Plavix only on Eliquis now. 07/19/20 Wt is 122.3, no SOB, does have some edema but this resolves during the night. She continues to take 40 mg of toresemide daily. Her potassium was increased to 60 meq in May when her potassium level was 3.7 and has not been rechecked. Will send Dr. Theador Hawthorne a note asking if he would like to get that rechecked before he sees her again 08/15/20. Reinforced to call if any problems arise. 08/16/20 Pt will need to resume her home self management of her HF. She denies SOB, edema. 08/20/20 Pt reports she has not started weighing again. She will ask her nephew to get the scale out again for her so she can resume. She does report edema and she saw her nephrologist who advised she can take  torsemide 60 mg now. Wt 134# in MD office. He advised that if she does not lose 5# this week to call him. Reminded her to tell her nephew she does not need any added salt to her food. 09/03/20 Pt wt is 122. No SOB, only ususal foot and ankle edema.Continue sodium restriction, medication regimen and HF ACTION PLAN.      Other     Comorbidities Identified and Managed and documented with pt self management reported each month over the next 3 months.        Pt goal started: 04/04/20 Long term goal High priority Follow up 09/25/20 Expected completion; 07/26/20, Renewed for another 3 months 10/26/20. Barriers: Health Behaviors Knowledge     Notes: 04/04/20 Other comorbidities to monitor: DM, CKD, CAD. Chart review revealed Diabetes: Lipids were normal in January! HgbA1C was 6.0 in December! Does not check her glucose at home very often as this is well controlled. Reports she had a foot exam last fall, will have her eye exam: tomorrow.  CKD: labs ordered by nephrology, will get labs tomorrow and pick up jug for 24 hour urine collection. Next appt is last week of Marchl for follow up and discussion regarding lab results. No new CAD sxs and follow up with cardiology scheduled for May. 05/18/20 Labs recently done. CBC improving. Labs ordered by hematologist are mostly out of range. Dr. Leron Croak is following closely. Pt is getting iron infusions. Reviewed safety measures since pt is back at her home. She is getting around better all the time and she is starting to drive again and feels confident. She is extremely cautious when ambulating and avoids taking risks like stepping up on a curb. She will go up the curb cutout. Reinforced safety as #  1 and notifying MD of any problems early. 06/18/20 Pt is feeling the best she has felt, HF more stable, CKD also stable back to stage III. She is able to do her housework slowly as she tires easily. She says she cannot bend over and clean her bath tub. Suggested she use a mop and that  way she doesn't need to bend over. Her thyroid levels were recently checked and that is normal range. was 9.9.07/19/20 Saw GI and she is stable, no dx scheduled. 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 Advised she should hold pressure over areas she knows she has bumped to minimize the superficial bruising. Suggested we request an application for pharmacy assistance for the Eliquis and she is in agreement. Will refer. Encouraged to call for any issues for prompt intervention and to avoid complications. She did have an iron infusion yesterday as her hgb 08/16/20 New problem illeus, requiring exploratory lab and discovery of gangrenous appendix. Ileus resolved. Home now with nepthew. Able to eat soft foods, having formed stools. Will have surgical follow up. 08/20/20 CKD-had f/u with nephrologist 7/22, he increased torsemide to address wt gain, low na, HTN. Pt is not back on track with weighing daily since she came home from the hospital last week. Reinforced that is #1 self care action to resume and to advise MD or NP if wt increases 3-5#. Hgb low and she will resume her Epogen injections. Will see primary care via televisit this week, cardiology 09/08/20. GI follow up on 01/08/21. 09/03/20 Pt had OV with primary last week, Hgb low and had to have 2 units of blood. She will have her epogen injection tomorrow. Praised for making her appt. Encouraged continued self care.         Plan: Pt agrees to care plan and   Follow-up: 09/18/20  [name]. [name], MSN, [facility] Gerontological Nurse Practitioner [facility] Care Management [phone]

## 2020-09-04 ENCOUNTER — Other Ambulatory Visit: Payer: Self-pay

## 2020-09-04 ENCOUNTER — Encounter (HOSPITAL_COMMUNITY)
Admission: RE | Admit: 2020-09-04 | Discharge: 2020-09-04 | Disposition: A | Discharge status: Home / Self Care | Payer: Medicare Other | Source: Ambulatory Visit | Attending: Nephrology | Admitting: Nephrology

## 2020-09-04 ENCOUNTER — Encounter (HOSPITAL_COMMUNITY): Payer: Self-pay

## 2020-09-04 DIAGNOSIS — I130 Hypertensive heart and chronic kidney disease with heart failure and stage 1 through stage 4 chronic kidney disease, or unspecified chronic kidney disease: Secondary | ICD-10-CM | POA: Diagnosis not present

## 2020-09-04 DIAGNOSIS — I5023 Acute on chronic systolic (congestive) heart failure: Secondary | ICD-10-CM | POA: Diagnosis not present

## 2020-09-04 DIAGNOSIS — N184 Chronic kidney disease, stage 4 (severe): Secondary | ICD-10-CM | POA: Insufficient documentation

## 2020-09-04 DIAGNOSIS — E1122 Type 2 diabetes mellitus with diabetic chronic kidney disease: Secondary | ICD-10-CM | POA: Diagnosis not present

## 2020-09-04 DIAGNOSIS — K567 Ileus, unspecified: Secondary | ICD-10-CM | POA: Diagnosis not present

## 2020-09-04 DIAGNOSIS — D631 Anemia in chronic kidney disease: Secondary | ICD-10-CM | POA: Insufficient documentation

## 2020-09-04 DIAGNOSIS — Z48815 Encounter for surgical aftercare following surgery on the digestive system: Secondary | ICD-10-CM | POA: Diagnosis not present

## 2020-09-04 LAB — POCT HEMOGLOBIN-HEMACUE: Hemoglobin: 10.8 g/dL — ABNORMAL LOW (ref 12.0–15.0)

## 2020-09-05 DIAGNOSIS — K567 Ileus, unspecified: Secondary | ICD-10-CM | POA: Diagnosis not present

## 2020-09-05 DIAGNOSIS — I130 Hypertensive heart and chronic kidney disease with heart failure and stage 1 through stage 4 chronic kidney disease, or unspecified chronic kidney disease: Secondary | ICD-10-CM | POA: Diagnosis not present

## 2020-09-05 DIAGNOSIS — Z48815 Encounter for surgical aftercare following surgery on the digestive system: Secondary | ICD-10-CM | POA: Diagnosis not present

## 2020-09-05 DIAGNOSIS — N184 Chronic kidney disease, stage 4 (severe): Secondary | ICD-10-CM | POA: Diagnosis not present

## 2020-09-05 DIAGNOSIS — E1122 Type 2 diabetes mellitus with diabetic chronic kidney disease: Secondary | ICD-10-CM | POA: Diagnosis not present

## 2020-09-05 DIAGNOSIS — I5023 Acute on chronic systolic (congestive) heart failure: Secondary | ICD-10-CM | POA: Diagnosis not present

## 2020-09-06 DIAGNOSIS — N184 Chronic kidney disease, stage 4 (severe): Secondary | ICD-10-CM | POA: Diagnosis not present

## 2020-09-06 DIAGNOSIS — Z48815 Encounter for surgical aftercare following surgery on the digestive system: Secondary | ICD-10-CM | POA: Diagnosis not present

## 2020-09-06 DIAGNOSIS — I5023 Acute on chronic systolic (congestive) heart failure: Secondary | ICD-10-CM | POA: Diagnosis not present

## 2020-09-06 DIAGNOSIS — E1122 Type 2 diabetes mellitus with diabetic chronic kidney disease: Secondary | ICD-10-CM | POA: Diagnosis not present

## 2020-09-06 DIAGNOSIS — K567 Ileus, unspecified: Secondary | ICD-10-CM | POA: Diagnosis not present

## 2020-09-06 DIAGNOSIS — I130 Hypertensive heart and chronic kidney disease with heart failure and stage 1 through stage 4 chronic kidney disease, or unspecified chronic kidney disease: Secondary | ICD-10-CM | POA: Diagnosis not present

## 2020-09-07 ENCOUNTER — Other Ambulatory Visit: Payer: Self-pay | Admitting: Cardiology

## 2020-09-07 ENCOUNTER — Other Ambulatory Visit: Payer: Self-pay

## 2020-09-07 ENCOUNTER — Encounter: Payer: Self-pay | Admitting: Student

## 2020-09-07 ENCOUNTER — Ambulatory Visit (INDEPENDENT_AMBULATORY_CARE_PROVIDER_SITE_OTHER): Payer: Medicare Other | Admitting: Student

## 2020-09-07 VITALS — BP 148/50 | HR 56 | Ht 65.0 in | Wt 128.0 lb

## 2020-09-07 DIAGNOSIS — N1832 Chronic kidney disease, stage 3b: Secondary | ICD-10-CM

## 2020-09-07 DIAGNOSIS — I255 Ischemic cardiomyopathy: Secondary | ICD-10-CM

## 2020-09-07 DIAGNOSIS — I10 Essential (primary) hypertension: Secondary | ICD-10-CM

## 2020-09-07 DIAGNOSIS — D649 Anemia, unspecified: Secondary | ICD-10-CM | POA: Diagnosis not present

## 2020-09-07 DIAGNOSIS — I2510 Atherosclerotic heart disease of native coronary artery without angina pectoris: Secondary | ICD-10-CM

## 2020-09-07 DIAGNOSIS — I480 Paroxysmal atrial fibrillation: Secondary | ICD-10-CM

## 2020-09-07 MED ORDER — TORSEMIDE 20 MG PO TABS: 40.0000 mg | ORAL_TABLET | Freq: Two times a day (BID) | ORAL | 3 refills | Status: DC

## 2020-09-07 NOTE — Progress Notes (Signed)
Cardiology Office Note    Date:  09/07/2020   ID:  [name], DOB 1934-05-31, MRN [id number]  PCP:  Glenda Chroman, MD  Cardiologist: Rozann Lesches, MD    Chief Complaint  Patient presents with   Follow-up    3 month visit    History of Present Illness:    Crystal [name] is a 86 y.o. female with past medical history of CAD (s/p STEMI in 01/2019 with DES to LAD, s/p NSTEMI in 12/2019 with DES to RCA, cath in 01/2020 showing patent stents), HFimpEF (EF 30-35% in 01/2019, 40-45% in 01/2020 and at 65-70% by echo in 05/2020), paroxysmal atrial fibrillation, HTN, HLD, Stage 3 CKD, renal artery stenosis and history of breast cancer (s/ R mastectomy) who presents to the office today for 68-month follow-up.  She was last examined by Dr. Domenic Polite in 05/2020 and denied any recent anginal symptoms at that time. She did report worsening lower extremity edema and it was recommended that Amlodipine be reduced to 2.5 mg daily to see if this would help with her symptoms. She did report significant recurring bruising and easy bleeding, therefore Plavix was stopped given that she was over 6 months out from stent placement and was on Eliquis.   In the interim, she was admitted to [facility] in 07/2020 for ileus in the setting of appendicitis. Underwent laparoscopic appendectomy and was restarted on Eliquis at the time of discharge.  In talking with the patient today, she reports slowly recovering from her surgery last month and is currently staying with family members. She is hopeful to return home and is working with PT to try to regain her strength. She has noticed worsening lower extremity edema over the past several weeks and says this started after hospitalization. She did receive 2 units pRBC's per her PCP last week for anemia but does not believe her symptoms worsened after this. Denies any associated dyspnea on exertion, orthopnea or PND. No recent chest pain or palpitations.   Past  Medical History:  Diagnosis Date   Anemia    Bell's palsy    Breast cancer ([place]) 1998   Right mastectomy   CAD (coronary artery disease)    a. s/p STEMI in 01/2019 with DES to LAD b. s/p NSTEMI in 12/2019 with DES to RCA c. cath in 01/2020 showing patent stents   CHF (congestive heart failure) ([place])    a. EF 30-35% in 01/2019 b. 40-45% in 01/2020 c. EF at 65-70% by echo in 05/2020   Chronic kidney disease    Essential hypertension    GERD (gastroesophageal reflux disease)    Gout    History of skin cancer    Squamous cell, left shoulder   Mixed hyperlipidemia    Osteopenia    Paroxysmal atrial fibrillation (HCC)    Thyroid nodule    Type 2 diabetes mellitus ([place])     Past Surgical History:  Procedure Laterality Date   BIOPSY  10/13/2019   Procedure: BIOPSY;  Surgeon: Daneil Dolin, MD;  [facility];  Service: Endoscopy;;   CATARACT EXTRACTION  2016   COLONOSCOPY  2019   Dr Anthony Sar   CORONARY ANGIOGRAPHY N/A 01/16/2020   Procedure: CORONARY ANGIOGRAPHY;  Surgeon: Jettie Booze, MD;  Location: [place] [facility];  Service: Cardiovascular;  Laterality: N/A;   CORONARY STENT INTERVENTION N/A 01/16/2020   Procedure: CORONARY STENT INTERVENTION;  Surgeon: Jettie Booze, MD;  Location: [place] [facility];  Service:  Cardiovascular;  Laterality: N/A;   CORONARY/GRAFT ACUTE MI REVASCULARIZATION N/A 01/30/2019   Procedure: Coronary/Graft Acute MI Revascularization;  Surgeon: Burnell Blanks, MD;  Location: [place] [facility];  Service: Cardiovascular;  Laterality: N/A;   ESOPHAGOGASTRODUODENOSCOPY (EGD) WITH PROPOFOL N/A 10/13/2019   Non-obstructing Schatzki ring at GE junction, s/p dilation, erosive gastropathy with stigmata of recent bleeding, normal duodenum. Negative H.pylori.    HYSTEROSCOPY     INTRAVASCULAR ULTRASOUND/IVUS N/A 01/16/2020   Procedure: Intravascular Ultrasound/IVUS;  Surgeon: Jettie Booze, MD;  Location: [place] [facility];   Service: Cardiovascular;  Laterality: N/A;   LAPAROSCOPIC APPENDECTOMY N/A 08/03/2020   Procedure: APPENDECTOMY LAPAROSCOPIC;  Surgeon: Ronny Bacon, MD;  Location: [facility];  Service: General;  Laterality: N/A;   LEFT HEART CATH AND CORONARY ANGIOGRAPHY N/A 01/30/2019   Procedure: LEFT HEART CATH AND CORONARY ANGIOGRAPHY;  Surgeon: Burnell Blanks, MD;  Location: [place] [facility];  Service: Cardiovascular;  Laterality: N/A;   LEFT HEART CATH AND CORONARY ANGIOGRAPHY N/A 01/16/2020   Procedure: LEFT HEART CATH AND CORONARY ANGIOGRAPHY;  Surgeon: Jettie Booze, MD;  Location: [place] [facility];  Service: Cardiovascular;  Laterality: N/A;   MALONEY DILATION N/A 10/13/2019   Procedure: Venia Minks DILATION;  Surgeon: Daneil Dolin, MD;  [facility];  Service: Endoscopy;  Laterality: N/A;   Right mastectomy  1998   [place]   RIGHT/LEFT HEART CATH AND CORONARY ANGIOGRAPHY N/A 02/13/2020   Procedure: RIGHT/LEFT HEART CATH AND CORONARY ANGIOGRAPHY;  Surgeon: Martinique, Peter M, MD;  Location: [place] [facility];  Service: Cardiovascular;  Laterality: N/A;    Current Medications: Outpatient Medications Prior to Visit  Medication Sig Dispense Refill   acetaminophen (TYLENOL) 500 MG tablet Take 500 mg by mouth every 6 (six) hours as needed for headache (pain).     amiodarone (PACERONE) 200 MG tablet TAKE 2 TABLETS TWICE A DAY UNTIL 2/13 THEN REDUCE TO 1 TABLET DAILY. 60 tablet 6   amLODipine (NORVASC) 2.5 MG tablet Take 1 tablet (2.5 mg total) by mouth daily. 90 tablet 3   carvedilol (COREG) 25 MG tablet Take 1 tablet (25 mg total) by mouth in the morning and at bedtime. Breakfast & supper 180 tablet 3   cholecalciferol (VITAMIN D3) 25 MCG (1000 UT) tablet Take 1,000 Units by mouth daily with breakfast.     diazepam (VALIUM) 2 MG tablet Take 2 mg by mouth 2 (two) times daily as needed (dizzy spells.).     ELIQUIS 2.5 MG TABS tablet TAKE (1) TABLET TWICE DAILY. 60 tablet 5    epoetin alfa (EPOGEN) 3000 UNIT/ML injection Inject into the skin.     ferrous sulfate 325 (65 FE) MG tablet Take 325 mg by mouth daily before lunch.     isosorbide mononitrate (IMDUR) 120 MG 24 hr tablet TAKE 1 TABLET BY MOUTH AT BEDTIME. 30 tablet 6   loperamide (IMODIUM) 2 MG capsule Take 2-4 mg by mouth 4 (four) times daily as needed for diarrhea or loose stools.     meclizine (ANTIVERT) 25 MG tablet Take 25 mg by mouth 2 (two) times daily as needed for dizziness.     Multiple Vitamin (MULTIVITAMIN WITH MINERALS) TABS tablet Take 1 tablet by mouth daily. Centrum Silver for Women     multivitamin-lutein (OCUVITE-LUTEIN) CAPS capsule Take 1 capsule by mouth daily before lunch.      nitroGLYCERIN (NITROSTAT) 0.4 MG SL tablet Place 1 tablet (0.4 mg total) under the tongue every 5 (five)  minutes x 3 doses as needed for chest pain. 25 tablet 2   pantoprazole (PROTONIX) 40 MG tablet Take 1 tablet (40 mg total) by mouth daily. Take 30 minutes before breakfast 90 tablet 3   potassium chloride SA (KLOR-CON) 20 MEQ tablet Take 3 tablets (60 mEq total) by mouth daily. 270 tablet 1   rosuvastatin (CRESTOR) 5 MG tablet Take 0.5 tablets (2.5 mg total) by mouth every other day. 30 tablet 0   saccharomyces boulardii (FLORASTOR) 250 MG capsule Take 250 mg by mouth 2 (two) times daily as needed (diarrhea).     hydrALAZINE (APRESOLINE) 100 MG tablet TAKE 1 TABLET EVERY 8 HOURS 270 tablet 0   torsemide (DEMADEX) 20 MG tablet Take 60 mg by mouth daily.     torsemide (DEMADEX) 20 MG tablet Take 2 tablets (40 mg total) by mouth daily. (Patient not taking: Reported on 09/07/2020) 180 tablet 3   No facility-administered medications prior to visit.     Allergies:   Bactrim [sulfamethoxazole-trimethoprim], Sulfa antibiotics, Amlodipine, Clonidine derivatives, Evista [raloxifene hcl], Fosamax [alendronate sodium], Glipizide, Lipitor [atorvastatin calcium], Losartan, Pravastatin, and Azithromycin   Social History    Socioeconomic History   Marital status: Divorced    Spouse name: Not on file   Number of children: Not on file   Years of education: Not on file   Highest education level: Not on file  Occupational History   Not on file  Tobacco Use   Smoking status: Never   Smokeless tobacco: Never  Vaping Use   Vaping Use: Never used  Substance and Sexual Activity   Alcohol use: Never   Drug use: Never   Sexual activity: Not on file  Other Topics Concern   Not on file  Social History Narrative   Not on file   Social Determinants of Health   Financial Resource Strain: Low Risk    Difficulty of Paying Living Expenses: Not hard at all  Food Insecurity: No Food Insecurity   Worried About Charity fundraiser in the Last Year: Never true   Ran Out of Food in the Last Year: Never true  Transportation Needs: No Transportation Needs   Lack of Transportation (Medical): No   Lack of Transportation (Non-Medical): No  Physical Activity: Insufficiently Active   Days of Exercise per Week: 1 day   Minutes of Exercise per Session: 20 min  Stress: No Stress Concern Present   Feeling of Stress : Not at all  Social Connections: Moderately Isolated   Frequency of Communication with Friends and Family: More than three times a week   Frequency of Social Gatherings with Friends and Family: More than three times a week   Attends Religious Services: 1 to 4 times per year   Active Member of [employer] or Organizations: No   Attends Archivist Meetings: Never   Marital Status: Divorced     Family History:  The patient's family history includes Aneurysm in her sister; Cancer in her sister; Heart attack in her father, maternal uncle, maternal uncle, maternal uncle, paternal aunt, paternal aunt, paternal grandfather, and sister; Heart disease in her maternal uncle, maternal uncle, maternal uncle, paternal aunt, paternal aunt, paternal grandfather, and sister; Stroke in her mother.   Review of Systems:     Please see the history of present illness.     All other systems reviewed and are otherwise negative except as noted above.   Physical Exam:    VS:  BP (!) 148/50   Pulse Marland Kitchen)  56   Ht 5\' 5"  (1.651 m)   Wt 128 lb (58.1 kg)   SpO2 96%   BMI 21.30 kg/m    General: Well developed, well nourished,female appearing in no acute distress. Head: Normocephalic, atraumatic. Neck: No carotid bruits. JVD not elevated.  Lungs: Respirations regular and unlabored, without wheezes or rales.  Heart: Regular rate and rhythm. No S3 or S4.  No murmur, no rubs, or gallops appreciated. Abdomen: Appears non-distended. No obvious abdominal masses. Msk:  Strength and tone appear normal for age. No obvious joint deformities or effusions. Extremities: No clubbing or cyanosis. 1+ pitting edema bilaterally.  Distal pedal pulses are 2+ bilaterally. Neuro: Alert and oriented X 3. Moves all extremities spontaneously. No focal deficits noted. Psych:  Responds to questions appropriately with a normal affect. Skin: No rashes or lesions noted  Wt Readings from Last 3 Encounters:  09/07/20 128 lb (58.1 kg)  09/04/20 123 lb 14.4 oz (56.2 kg)  08/01/20 124 lb (56.2 kg)     Studies/Labs Reviewed:   EKG:  EKG is not ordered today.    Recent Labs: 02/14/2020: TSH 2.238 02/23/2020: B Natriuretic Peptide 3,507.5 08/02/2020: ALT 30 08/10/2020: BUN 40; Creatinine, Ser 1.85; Magnesium 1.7; Platelets 362; Potassium 4.5; Sodium 134 09/04/2020: Hemoglobin 10.8   Lipid Panel    Component Value Date/Time   CHOL 155 02/14/2020 0339   TRIG 74 02/14/2020 0339   HDL 57 02/14/2020 0339   CHOLHDL 2.7 02/14/2020 0339   VLDL 15 02/14/2020 0339   LDLCALC 83 02/14/2020 0339    Additional studies/ records that were reviewed today include:   Cardiac Catheterization: 01/2020 Non-stenotic Mid LAD lesion was previously treated. Prox Cx to Mid Cx lesion is 20% stenosed. Dist RCA lesion is 20% stenosed. Non-stenotic Mid RCA lesion  was previously treated. Hemodynamic findings consistent with mild pulmonary hypertension. LV end diastolic pressure is mildly elevated.   1. Nonobstructive CAD. Continued excellent patency of stents in the LAD and RCA 2. Mildly elevated LV filling pressures 3. Mildly elevated right heart pressures. 4. Normal cardiac output   Plan: continue medical therapy. Diuresis. BP control.  Echocardiogram: 05/2020 IMPRESSIONS     1. Left ventricular ejection fraction, by estimation, is 65 to 70%. The  left ventricle has normal function. The left ventricle has no regional  wall motion abnormalities. There is mild left ventricular hypertrophy.  Left ventricular diastolic parameters  are consistent with Grade II diastolic dysfunction (pseudonormalization).   2. Right ventricular systolic function is normal. The right ventricular  size is normal. There is mildly elevated pulmonary artery systolic  pressure. The estimated right ventricular systolic pressure is 46.5 mmHg.   3. Left atrial size was mildly dilated.   4. Right atrial size was moderately dilated.   5. There is a trivial pericardial effusion posterior to the left  ventricle.   6. The mitral valve is abnormal. Mild to moderate mitral valve  regurgitation.   7. Tricuspid valve regurgitation is moderate.   8. The aortic valve is tricuspid. Aortic valve regurgitation is trivial.  Aortic regurgitation PHT measures 956 msec.   9. The inferior vena cava is normal in size with <50% respiratory  variability, suggesting right atrial pressure of 8 mmHg.   Comparison(s): Echocardiogram done 02/17/20 showed an EF of 40-45%.    Assessment:    1. Coronary artery disease involving native coronary artery of native heart without angina pectoris   2. Ischemic cardiomyopathy   3. PAF (paroxysmal atrial fibrillation) ([place])  4. Essential hypertension   5. Stage 3b chronic kidney disease ([place])   6. Anemia, unspecified type      Plan:   In order  of problems listed above:  1. CAD - She is s/p STEMI in 01/2019 with DES to LAD, s/p NSTEMI in 12/2019 with DES to RCA and cath in 01/2020 showing patent stents. - Her activity has been limited since her recent surgery but she denies any anginal symptoms. - Continue current medication regimen with Coreg 25 mg twice daily, Imdur 120 mg daily and Crestor 2.5 mg every other day.  She is not on ASA given the need for anticoagulation.  2. HFimpEF - EF 30-35% in 01/2019, 40-45% in 01/2020 and at 65-70% by echo in 05/2020. She denies any recent orthopnea or PND but has experienced worsening lower extremity edema over the past few weeks. Reports that Torsemide was titrated from 40 mg daily to 60 mg daily by Nephrology with no improvement in her symptoms. I suspect this has worsened since receiving pRBC's last week as well. I did recommend that we temporarily titrate her Torsemide to 40 mg twice daily and that she take this at breakfast and lunch instead of all at once in the morning.  Will recheck a BMET in 2 weeks.   3. Paroxysmal atrial fibrillation - She denies any recent palpitations and is in normal sinus rhythm by examination today. She remains on Amiodarone 200 mg daily along with Coreg 25 mg twice daily. She does report her heart rate has occasionally been in the 40's and we did review that she could hold Coreg if her heart rate is less than 55. Was encouraged to follow readings as this may need to be reduced.  - She remains on Eliquis 2.5 mg twice daily which is the appropriate dose at this time given her age, weight and kidney function.  4. HTN - Her BP is at 148/50 during today's visit but has been well controlled when checked by Home Health. Continue current regimen for now with Amlodipine, Hydralazine, Imdur and Coreg.  5. Stage 3 CKD - Followed by Dr. Theador Hawthorne. Creatinine peaked at 3.57 during her recent admission but had improved to 1.85 on 08/10/2020. Will recheck a BMET in 2 weeks.   6.  Anemia - Hgb was at 8.2 on 08/31/2020 and her PCP ordered 2 units PRBC's. She reports not having any upcoming labs scheduled with her PCP, therefore will recheck a CBC in 2 weeks as well.     Medication Adjustments/Labs and Tests Ordered: Current medicines are reviewed at length with the patient today.  Concerns regarding medicines are outlined above.  Medication changes, Labs and Tests ordered today are listed in the Patient Instructions below. Patient Instructions  Do not take your Coreg if Heart Rate if less than 55 beats per minute.   Medication Instructions:  Your physician has recommended you make the following change in your medication:   Increase Torsemide to 40 mg Two Times Daily at Breakfast and Lunch   Do not take your Coreg if Heart Rate if less than 55 beats per minute.   *If you need a refill on your cardiac medications before your next appointment, please call your pharmacy*   Lab Work: Your physician recommends that you return for lab work in: 2 Weeks at [facility]   If you have labs (blood work) drawn today and your tests are completely normal, you will receive your results only by: [employer] (if you have  MyChart) OR A paper copy in the mail If you have any lab test that is abnormal or we need to change your treatment, we will call you to review the results.   Testing/Procedures: NONE    Follow-Up: At [facility], you and your health needs are our priority.  As part of our continuing mission to provide you with exceptional heart care, we have created designated Provider Care Teams.  These Care Teams include your primary Cardiologist (physician) and Advanced Practice Providers (APPs -  Physician Assistants and Nurse Practitioners) who all work together to provide you with the care you need, when you need it.  We recommend signing up for the patient portal called "MyChart".  Sign up information is provided on this After Visit Summary.  MyChart is  used to connect with patients for Virtual Visits (Telemedicine).  Patients are able to view lab/test results, encounter notes, upcoming appointments, etc.  Non-urgent messages can be sent to your provider as well.   To learn more about what you can do with MyChart, go to [email or web address].    Your next appointment:   2-3 month(s)  The format for your next appointment:   In Person  Provider:   Rozann Lesches, MD   Other Instructions Thank you for choosing [facility]!     Signed, Erma Heritage, PA-C  09/07/2020 4:45 PM    [facility] [address] [place], [place] [address] Phone: [phone] Fax: [phone]

## 2020-09-07 NOTE — Patient Instructions (Addendum)
Do not take your Coreg if Heart Rate if less than 55 beats per minute.   Medication Instructions:  Your physician has recommended you make the following change in your medication:   Increase Torsemide to 40 mg Two Times Daily at Breakfast and Lunch   Do not take your Coreg if Heart Rate if less than 55 beats per minute.   *If you need a refill on your cardiac medications before your next appointment, please call your pharmacy*   Lab Work: Your physician recommends that you return for lab work in: 2 Weeks at [facility]   If you have labs (blood work) drawn today and your tests are completely normal, you will receive your results only by: [employer] (if you have MyChart) OR A paper copy in the mail If you have any lab test that is abnormal or we need to change your treatment, we will call you to review the results.   Testing/Procedures: NONE    Follow-Up: At [facility], you and your health needs are our priority.  As part of our continuing mission to provide you with exceptional heart care, we have created designated Provider Care Teams.  These Care Teams include your primary Cardiologist (physician) and Advanced Practice Providers (APPs -  Physician Assistants and Nurse Practitioners) who all work together to provide you with the care you need, when you need it.  We recommend signing up for the patient portal called "MyChart".  Sign up information is provided on this After Visit Summary.  MyChart is used to connect with patients for Virtual Visits (Telemedicine).  Patients are able to view lab/test results, encounter notes, upcoming appointments, etc.  Non-urgent messages can be sent to your provider as well.   To learn more about what you can do with MyChart, go to [email or web address].    Your next appointment:   2-3 month(s)  The format for your next appointment:   In Person  Provider:   Rozann Lesches, MD   Other Instructions Thank you for choosing  [facility]!

## 2020-09-11 DIAGNOSIS — N184 Chronic kidney disease, stage 4 (severe): Secondary | ICD-10-CM | POA: Diagnosis not present

## 2020-09-11 DIAGNOSIS — D649 Anemia, unspecified: Secondary | ICD-10-CM | POA: Diagnosis not present

## 2020-09-11 DIAGNOSIS — I5023 Acute on chronic systolic (congestive) heart failure: Secondary | ICD-10-CM | POA: Diagnosis not present

## 2020-09-11 DIAGNOSIS — I130 Hypertensive heart and chronic kidney disease with heart failure and stage 1 through stage 4 chronic kidney disease, or unspecified chronic kidney disease: Secondary | ICD-10-CM | POA: Diagnosis not present

## 2020-09-11 DIAGNOSIS — Z48815 Encounter for surgical aftercare following surgery on the digestive system: Secondary | ICD-10-CM | POA: Diagnosis not present

## 2020-09-11 DIAGNOSIS — K567 Ileus, unspecified: Secondary | ICD-10-CM | POA: Diagnosis not present

## 2020-09-11 DIAGNOSIS — E1122 Type 2 diabetes mellitus with diabetic chronic kidney disease: Secondary | ICD-10-CM | POA: Diagnosis not present

## 2020-09-12 DIAGNOSIS — E871 Hypo-osmolality and hyponatremia: Secondary | ICD-10-CM | POA: Diagnosis not present

## 2020-09-12 DIAGNOSIS — D509 Iron deficiency anemia, unspecified: Secondary | ICD-10-CM | POA: Diagnosis not present

## 2020-09-12 DIAGNOSIS — Z48815 Encounter for surgical aftercare following surgery on the digestive system: Secondary | ICD-10-CM | POA: Diagnosis not present

## 2020-09-12 DIAGNOSIS — K567 Ileus, unspecified: Secondary | ICD-10-CM | POA: Diagnosis not present

## 2020-09-12 DIAGNOSIS — K219 Gastro-esophageal reflux disease without esophagitis: Secondary | ICD-10-CM | POA: Diagnosis not present

## 2020-09-12 DIAGNOSIS — Z7901 Long term (current) use of anticoagulants: Secondary | ICD-10-CM | POA: Diagnosis not present

## 2020-09-12 DIAGNOSIS — I130 Hypertensive heart and chronic kidney disease with heart failure and stage 1 through stage 4 chronic kidney disease, or unspecified chronic kidney disease: Secondary | ICD-10-CM | POA: Diagnosis not present

## 2020-09-12 DIAGNOSIS — N179 Acute kidney failure, unspecified: Secondary | ICD-10-CM | POA: Diagnosis not present

## 2020-09-12 DIAGNOSIS — I480 Paroxysmal atrial fibrillation: Secondary | ICD-10-CM | POA: Diagnosis not present

## 2020-09-12 DIAGNOSIS — E041 Nontoxic single thyroid nodule: Secondary | ICD-10-CM | POA: Diagnosis not present

## 2020-09-12 DIAGNOSIS — Z9011 Acquired absence of right breast and nipple: Secondary | ICD-10-CM | POA: Diagnosis not present

## 2020-09-12 DIAGNOSIS — G510 Bell's palsy: Secondary | ICD-10-CM | POA: Diagnosis not present

## 2020-09-12 DIAGNOSIS — M8580 Other specified disorders of bone density and structure, unspecified site: Secondary | ICD-10-CM | POA: Diagnosis not present

## 2020-09-12 DIAGNOSIS — Z9049 Acquired absence of other specified parts of digestive tract: Secondary | ICD-10-CM | POA: Diagnosis not present

## 2020-09-12 DIAGNOSIS — I700 Atherosclerosis of aorta: Secondary | ICD-10-CM | POA: Diagnosis not present

## 2020-09-12 DIAGNOSIS — E782 Mixed hyperlipidemia: Secondary | ICD-10-CM | POA: Diagnosis not present

## 2020-09-12 DIAGNOSIS — Z853 Personal history of malignant neoplasm of breast: Secondary | ICD-10-CM | POA: Diagnosis not present

## 2020-09-12 DIAGNOSIS — E876 Hypokalemia: Secondary | ICD-10-CM | POA: Diagnosis not present

## 2020-09-12 DIAGNOSIS — N184 Chronic kidney disease, stage 4 (severe): Secondary | ICD-10-CM | POA: Diagnosis not present

## 2020-09-12 DIAGNOSIS — I2510 Atherosclerotic heart disease of native coronary artery without angina pectoris: Secondary | ICD-10-CM | POA: Diagnosis not present

## 2020-09-12 DIAGNOSIS — I252 Old myocardial infarction: Secondary | ICD-10-CM | POA: Diagnosis not present

## 2020-09-12 DIAGNOSIS — Z9181 History of falling: Secondary | ICD-10-CM | POA: Diagnosis not present

## 2020-09-12 DIAGNOSIS — I5023 Acute on chronic systolic (congestive) heart failure: Secondary | ICD-10-CM | POA: Diagnosis not present

## 2020-09-12 DIAGNOSIS — M1030 Gout due to renal impairment, unspecified site: Secondary | ICD-10-CM | POA: Diagnosis not present

## 2020-09-12 DIAGNOSIS — E1122 Type 2 diabetes mellitus with diabetic chronic kidney disease: Secondary | ICD-10-CM | POA: Diagnosis not present

## 2020-09-18 ENCOUNTER — Telehealth: Payer: Self-pay

## 2020-09-18 ENCOUNTER — Other Ambulatory Visit: Payer: Self-pay | Admitting: Cardiology

## 2020-09-18 ENCOUNTER — Encounter (HOSPITAL_COMMUNITY)
Admission: RE | Admit: 2020-09-18 | Discharge: 2020-09-18 | Disposition: A | Discharge status: Home / Self Care | Payer: Medicare Other | Source: Ambulatory Visit | Attending: Nephrology | Admitting: Nephrology

## 2020-09-18 ENCOUNTER — Other Ambulatory Visit: Payer: Self-pay | Admitting: *Deleted

## 2020-09-18 DIAGNOSIS — E1122 Type 2 diabetes mellitus with diabetic chronic kidney disease: Secondary | ICD-10-CM | POA: Diagnosis not present

## 2020-09-18 DIAGNOSIS — N184 Chronic kidney disease, stage 4 (severe): Secondary | ICD-10-CM | POA: Diagnosis not present

## 2020-09-18 DIAGNOSIS — K567 Ileus, unspecified: Secondary | ICD-10-CM | POA: Diagnosis not present

## 2020-09-18 DIAGNOSIS — I130 Hypertensive heart and chronic kidney disease with heart failure and stage 1 through stage 4 chronic kidney disease, or unspecified chronic kidney disease: Secondary | ICD-10-CM | POA: Diagnosis not present

## 2020-09-18 DIAGNOSIS — I5023 Acute on chronic systolic (congestive) heart failure: Secondary | ICD-10-CM | POA: Diagnosis not present

## 2020-09-18 DIAGNOSIS — Z48815 Encounter for surgical aftercare following surgery on the digestive system: Secondary | ICD-10-CM | POA: Diagnosis not present

## 2020-09-18 NOTE — Telephone Encounter (Signed)
Noted, Amlodipine removed from medication list.

## 2020-09-18 NOTE — Patient Outreach (Addendum)
[facility] ([facility]) Care Management  [facility] Care Manager  09/18/2020   Crystal [name] 01/22/1935 [id number]  Subjective: Follow up HF   Pt reports her family has had COVID but she and her niece have set up restrictions for those that are infected and they have been able to stay COVID free.  Encounter Medications:  Outpatient Encounter Medications as of 09/18/2020  Medication Sig Note   carvedilol (COREG) 25 MG tablet Take 1 tablet (25 mg total) by mouth in the morning and at bedtime. Breakfast & supper 09/18/2020: Has new instructions to hold carvedilol at night if pulse is <55.    torsemide (DEMADEX) 20 MG tablet Take 2 tablets (40 mg total) by mouth 2 (two) times daily. Take at breakfast and at Lunch    acetaminophen (TYLENOL) 500 MG tablet Take 500 mg by mouth every 6 (six) hours as needed for headache (pain).    amiodarone (PACERONE) 200 MG tablet TAKE 2 TABLETS TWICE A DAY UNTIL 2/13 THEN REDUCE TO 1 TABLET DAILY.    amLODipine (NORVASC) 2.5 MG tablet Take 1 tablet (2.5 mg total) by mouth daily.    cholecalciferol (VITAMIN D3) 25 MCG (1000 UT) tablet Take 1,000 Units by mouth daily with breakfast.    diazepam (VALIUM) 2 MG tablet Take 2 mg by mouth 2 (two) times daily as needed (dizzy spells.).    ELIQUIS 2.5 MG TABS tablet TAKE (1) TABLET TWICE DAILY.    epoetin alfa (EPOGEN) 3000 UNIT/ML injection Inject into the skin.    ferrous sulfate 325 (65 FE) MG tablet Take 325 mg by mouth daily before lunch. 05/18/2020: HAS RESTARTED ON 05/18/20   hydrALAZINE (APRESOLINE) 100 MG tablet TAKE 1 TABLET EVERY 8 HOURS    isosorbide mononitrate (IMDUR) 120 MG 24 hr tablet TAKE 1 TABLET BY MOUTH AT BEDTIME.    loperamide (IMODIUM) 2 MG capsule Take 2-4 mg by mouth 4 (four) times daily as needed for diarrhea or loose stools.    meclizine (ANTIVERT) 25 MG tablet Take 25 mg by mouth 2 (two) times daily as needed for dizziness.    Multiple Vitamin (MULTIVITAMIN WITH MINERALS) TABS tablet  Take 1 tablet by mouth daily. Centrum Silver for Women    multivitamin-lutein (OCUVITE-LUTEIN) CAPS capsule Take 1 capsule by mouth daily before lunch.     nitroGLYCERIN (NITROSTAT) 0.4 MG SL tablet Place 1 tablet (0.4 mg total) under the tongue every 5 (five) minutes x 3 doses as needed for chest pain.    pantoprazole (PROTONIX) 40 MG tablet Take 1 tablet (40 mg total) by mouth daily. Take 30 minutes before breakfast    potassium chloride SA (KLOR-CON) 20 MEQ tablet Take 3 tablets (60 mEq total) by mouth daily.    rosuvastatin (CRESTOR) 5 MG tablet Take 0.5 tablets (2.5 mg total) by mouth every other day.    saccharomyces boulardii (FLORASTOR) 250 MG capsule Take 250 mg by mouth 2 (two) times daily as needed (diarrhea).    No facility-administered encounter medications on file as of 09/18/2020.    Functional Status:  In your present state of health, do you have any difficulty performing the following activities: 08/02/2020 04/12/2020  Hearing? N -  Vision? N -  Difficulty concentrating or making decisions? N -  Walking or climbing stairs? N -  Dressing or bathing? N -  Doing errands, shopping? N -  Preparing Food and eating ? - N  Comment - Late entry for 02/03/20. CCS  Using the Toilet? - N    Comment - Late entry for 02/03/20. CCS  In the past six months, have you accidently leaked urine? - Y  Comment - Late entry for 02/03/20. CCS  Do you have problems with loss of bowel control? - N  Comment - Late entry for 02/03/20. CCS  Managing your Medications? - N  Comment - Late entry for 02/03/20. CCS  Managing your Finances? - N  Comment - Late entry for 02/03/20. CCS  Housekeeping or managing your Housekeeping? - Y  Comment - Late entry for 02/03/20. CCS  Some recent data might be hidden    Fall/Depression Screening: Fall Risk  04/12/2020 03/04/2019  Falls in the past year? 0 0  Comment Late entry for 02/03/20. CCS -  Number falls in past yr: 0 -  Comment Late entry for 02/03/20. CCS -  Injury with  Fall? 0 -  Comment Late entry for 02/03/20. CCS -  Risk for fall due to : Medication side effect -  Risk for fall due to: Comment Late entry for 02/03/20. CCS -  Follow up Falls evaluation completed -  Comment Late entry for 02/03/20. CCS -   PHQ 2/9 Scores 05/01/2020 04/12/2020 03/04/2019  PHQ - 2 Score 0 2 0  PHQ- 9 Score - 4 -    Assessment: HF - stable  Care Plan   Goals Addressed               This Visit's Progress     Patient Stated     COMPLETED: ([facility]) Patient Stated (pt-stated)        Pt will call MD or NP if has increasing sxs HF (wt. Gain, wheezing, SOB, Edema) to overt an ED visit over the next 3 months.  Start date 10/23/20, renewed on 03/07/20., renewed 06/18/20  High Priority Long term goal Expected end date: Follow up 09/25/20  Barriers: Health Behaviors Knowledge  03/07/20 Pt has followed her goal but had to go to the ED and be hospitalized for subsequent MI. Reinforced HF and CAD Action plan to weigh daily, take meds, activity to tolerance, go to appts and report any heart sxs early to MD to prevent complications and subsequent trips to the hospital. 03/14/20 Following HF Action plan. Wt is stable, no signs of HF. Pt acknowledges she knows when to call for advice. She will have video visit with cardiology tomorrow She feels she is making progress, striving to get her independence back. She feels she is less SOB and it is evident in talking with her, she is less SOB when talking. Encouraged her to give all her effort in her therapy sessions to progress and to be safe. 03/21/20 No acute probems, progressing nicely. Encouraged continued efforts, attending appts, following medical recommendations. Celebrated that she is happy with her progress and that she IS getting better. 03/28/20 Pt reports continueous improvements in her phycial recovery. No HF exacerbating sxs reported. Knows when to call for help. Reinforced Action Plan. 04/04/20 Wt stable between 127-129#, no SOB and minimal edema.  General overall health improvement. Praised for her continued self managed and encouraged to continue this life long regimen. 05/03/20 Wt 129#. Has some SOB on exertion but note her Hgb has fallen to 7.0! She is being worked up by hematology, has had a bone scan. She has had a couple of dark stools and has reported this to her GI provider. Dr. Boone is trying to get her in the office ASAP. She has moved back to her own home. She is   able to get around cautiously using her walker. They kids are checking in on her daily. She carries her cell phone with her at all times to call for help if she needs to. 05/18/20 Has not needed to call for any new significant sxs. Today's wt was 130#. She saw Dr. McDowell this week and he did increase her demadex dose to 40 mg for 3 days the to alternating days of 20 mg and 40 mg. Her niece is assisting her with medication organization for bid dosing. This works very well for her. She will f/u with him in May. Her hgb is up to 8.1, steadily increasing. She has resumed her eliquis and iron supplementation. She has seen her GI specialist. 06/18/20 Today's wt is 120# (10# wt loss) Demadex has been increased to 40 mg daily continuously now. Reinforced low salt diet, especially when eating out. Ask for low Na and heart healthy choices. Dr. McDowell took her off of Plavix only on Eliquis now. 07/19/20 Wt is 122.3, no SOB, does have some edema but this resolves during the night. She continues to take 40 mg of toresemide daily. Her potassium was increased to 60 meq in May when her potassium level was 3.7 and has not been rechecked. Will send Dr. Bhutani a note asking if he would like to get that rechecked before he sees her again 08/15/20. Reinforced to call if any problems arise. 08/16/20 Pt will need to resume her home self management of her HF. She denies SOB, edema. 08/20/20 Pt reports she has not started weighing again. She will ask her nephew to get the scale out again for her so she can resume.  She does report edema and she saw her nephrologist who advised she can take torsemide 60 mg now. Wt 134# in MD office. He advised that if she does not lose 5# this week to call him. Reminded her to tell her nephew she does not need any added salt to her food. 09/03/20 Pt wt is 122. No SOB, only ususal foot and ankle edema.Continue sodium restriction, medication regimen and HF ACTION PLAN.09/18/20 Wt is down to 119 since diuretic has been increased to 40 mg am and at lunch. Still has edema which may be contributed to amlodipine. Will communicate with cardiology to consider stopping, only on 2.5 mg. Praised pt for following her HF Action PLan consistently. Goal met.      Other     Comorbidities Identified and Managed and documented with pt self management reported each month over the next 3 months.        Pt goal started: 04/04/20 Long term goal High priority Follow up 10/18/20 Expected completion; 07/26/20, Renewed for another 3 months 10/26/20. Barriers: Health Behaviors Knowledge     Notes: 04/04/20 Other comorbidities to monitor: DM, CKD, CAD. Chart review revealed Diabetes: Lipids were normal in January! HgbA1C was 6.0 in December! Does not check her glucose at home very often as this is well controlled. Reports she had a foot exam last fall, will have her eye exam: tomorrow.  CKD: labs ordered by nephrology, will get labs tomorrow and pick up jug for 24 hour urine collection. Next appt is last week of Marchl for follow up and discussion regarding lab results. No new CAD sxs and follow up with cardiology scheduled for May. 05/18/20 Labs recently done. CBC improving. Labs ordered by hematologist are mostly out of range. Dr. Pennington is following closely. Pt is getting iron infusions. Reviewed safety measures since pt is   back at her home. She is getting around better all the time and she is starting to drive again and feels confident. She is extremely cautious when ambulating and avoids taking risks like  stepping up on a curb. She will go up the curb cutout. Reinforced safety as #1 and notifying MD of any problems early. 06/18/20 Pt is feeling the best she has felt, HF more stable, CKD also stable back to stage III. She is able to do her housework slowly as she tires easily. She says she cannot bend over and clean her bath tub. Suggested she use a mop and that way she doesn't need to bend over. Her thyroid levels were recently checked and that is normal range. was 9.9.07/19/20 Saw GI and she is stable, no dx scheduled. 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 Advised she should hold pressure over areas she knows she has bumped to minimize the superficial bruising. Suggested we request an application for pharmacy assistance for the Eliquis and she is in agreement. Will refer. Encouraged to call for any issues for prompt intervention and to avoid complications. She did have an iron infusion yesterday as her hgb 08/16/20 New problem illeus, requiring exploratory lab and discovery of gangrenous appendix. Ileus resolved. Home now with nepthew. Able to eat soft foods, having formed stools. Will have surgical follow up. 08/20/20 CKD-had f/u with nephrologist 7/22, he increased torsemide to address wt gain, low na, HTN. Pt is not back on track with weighing daily since she came home from the hospital last week. Reinforced that is #1 self care action to resume and to advise MD or NP if wt increases 3-5#. Hgb low and she will resume her Epogen injections. Will see primary care via televisit this week, cardiology 09/08/20. GI follow up on 01/08/21. 09/03/20 Pt had OV with primary last week, Hgb low and had to have 2 units of blood. She will have her epogen injection tomorrow. Praised for making her appt. Encouraged continued self care. 09/18/20 Improved general well-being with having blood, increasing diuretic and reducing her beta blocker dose. Praised for how she is coping and  managing her multiple health issues!         Plan:  Follow-up: Patient agrees to Care Plan and Follow-up. Follow-up in 1 month(s)  Carroll C. Spinks, MSN, GNP-BC Gerontological Nurse Practitioner [facility] Care Management [phone]  Dr. McDowell comunicated OK to stop amlodipine and reassess for reduced edema and BP control. CCS     

## 2020-09-18 NOTE — Telephone Encounter (Signed)
-----   Message from Satira Sark, MD sent at 09/18/2020 10:21 AM EDT ----- Regarding: RE: ?Stop amlodipine I cut her Norvasc down to 2.5 mg daily at the last visit with adjustments in diuretics as well.  We could stop the Norvasc completely, but if blood pressure goes up we will have somewhat limited options given her renal insufficiency.  Go ahead and stop Norvasc for now.  Will forward to my [place] team. ----- Message ----- From: Deloria Lair, NP Sent: 09/18/2020  10:19 AM EDT To: Satira Sark, MD Subject: ?Stop amlodipine                               Good morning, Dr. Domenic Polite!  I have talked with Mrs. Stallings this am and she continues to have peripheral edema despite an increased in her torsemide to 40 mg am and lunch. She has losot 3# down to 119# on her home scale, which is a 3# loss in 2 weeks.   Would you consider stopping her amlodipine 2.5 mg to see if her edema resolves?  Thank you for your consideration.  [name]. [name], MSN, [facility] Gerontological Nurse Practitioner [facility] Care Management [phone]

## 2020-09-19 DIAGNOSIS — N184 Chronic kidney disease, stage 4 (severe): Secondary | ICD-10-CM | POA: Diagnosis not present

## 2020-09-19 DIAGNOSIS — Z48815 Encounter for surgical aftercare following surgery on the digestive system: Secondary | ICD-10-CM | POA: Diagnosis not present

## 2020-09-19 DIAGNOSIS — E1122 Type 2 diabetes mellitus with diabetic chronic kidney disease: Secondary | ICD-10-CM | POA: Diagnosis not present

## 2020-09-19 DIAGNOSIS — K567 Ileus, unspecified: Secondary | ICD-10-CM | POA: Diagnosis not present

## 2020-09-19 DIAGNOSIS — I130 Hypertensive heart and chronic kidney disease with heart failure and stage 1 through stage 4 chronic kidney disease, or unspecified chronic kidney disease: Secondary | ICD-10-CM | POA: Diagnosis not present

## 2020-09-19 DIAGNOSIS — I5023 Acute on chronic systolic (congestive) heart failure: Secondary | ICD-10-CM | POA: Diagnosis not present

## 2020-09-20 DIAGNOSIS — I5023 Acute on chronic systolic (congestive) heart failure: Secondary | ICD-10-CM | POA: Diagnosis not present

## 2020-09-20 DIAGNOSIS — I130 Hypertensive heart and chronic kidney disease with heart failure and stage 1 through stage 4 chronic kidney disease, or unspecified chronic kidney disease: Secondary | ICD-10-CM | POA: Diagnosis not present

## 2020-09-20 DIAGNOSIS — E1122 Type 2 diabetes mellitus with diabetic chronic kidney disease: Secondary | ICD-10-CM | POA: Diagnosis not present

## 2020-09-20 DIAGNOSIS — N184 Chronic kidney disease, stage 4 (severe): Secondary | ICD-10-CM | POA: Diagnosis not present

## 2020-09-20 DIAGNOSIS — Z48815 Encounter for surgical aftercare following surgery on the digestive system: Secondary | ICD-10-CM | POA: Diagnosis not present

## 2020-09-20 DIAGNOSIS — K567 Ileus, unspecified: Secondary | ICD-10-CM | POA: Diagnosis not present

## 2020-09-25 DIAGNOSIS — K567 Ileus, unspecified: Secondary | ICD-10-CM | POA: Diagnosis not present

## 2020-09-25 DIAGNOSIS — I130 Hypertensive heart and chronic kidney disease with heart failure and stage 1 through stage 4 chronic kidney disease, or unspecified chronic kidney disease: Secondary | ICD-10-CM | POA: Diagnosis not present

## 2020-09-25 DIAGNOSIS — I5023 Acute on chronic systolic (congestive) heart failure: Secondary | ICD-10-CM | POA: Diagnosis not present

## 2020-09-25 DIAGNOSIS — E1122 Type 2 diabetes mellitus with diabetic chronic kidney disease: Secondary | ICD-10-CM | POA: Diagnosis not present

## 2020-09-25 DIAGNOSIS — N184 Chronic kidney disease, stage 4 (severe): Secondary | ICD-10-CM | POA: Diagnosis not present

## 2020-09-25 DIAGNOSIS — Z48815 Encounter for surgical aftercare following surgery on the digestive system: Secondary | ICD-10-CM | POA: Diagnosis not present

## 2020-09-26 ENCOUNTER — Other Ambulatory Visit: Payer: Self-pay

## 2020-09-26 ENCOUNTER — Encounter (HOSPITAL_COMMUNITY)
Admission: RE | Admit: 2020-09-26 | Discharge: 2020-09-26 | Disposition: A | Discharge status: Home / Self Care | Payer: Medicare Other | Source: Ambulatory Visit | Attending: Nephrology | Admitting: Nephrology

## 2020-09-26 DIAGNOSIS — I130 Hypertensive heart and chronic kidney disease with heart failure and stage 1 through stage 4 chronic kidney disease, or unspecified chronic kidney disease: Secondary | ICD-10-CM | POA: Diagnosis not present

## 2020-09-26 DIAGNOSIS — D631 Anemia in chronic kidney disease: Secondary | ICD-10-CM | POA: Diagnosis not present

## 2020-09-26 DIAGNOSIS — Z48815 Encounter for surgical aftercare following surgery on the digestive system: Secondary | ICD-10-CM | POA: Diagnosis not present

## 2020-09-26 DIAGNOSIS — N184 Chronic kidney disease, stage 4 (severe): Secondary | ICD-10-CM | POA: Diagnosis not present

## 2020-09-26 DIAGNOSIS — E1122 Type 2 diabetes mellitus with diabetic chronic kidney disease: Secondary | ICD-10-CM | POA: Diagnosis not present

## 2020-09-26 DIAGNOSIS — I5023 Acute on chronic systolic (congestive) heart failure: Secondary | ICD-10-CM | POA: Diagnosis not present

## 2020-09-26 DIAGNOSIS — K567 Ileus, unspecified: Secondary | ICD-10-CM | POA: Diagnosis not present

## 2020-09-26 LAB — POCT HEMOGLOBIN-HEMACUE: Hemoglobin: 10.4 g/dL — ABNORMAL LOW (ref 12.0–15.0)

## 2020-09-26 MED ORDER — EPOETIN ALFA-EPBX 3000 UNIT/ML IJ SOLN
3000.0000 [IU] | Freq: Once | INTRAMUSCULAR | Status: DC
Start: 2020-09-26 — End: 2020-09-26

## 2020-09-27 DIAGNOSIS — E1122 Type 2 diabetes mellitus with diabetic chronic kidney disease: Secondary | ICD-10-CM | POA: Diagnosis not present

## 2020-09-27 DIAGNOSIS — N189 Chronic kidney disease, unspecified: Secondary | ICD-10-CM | POA: Diagnosis not present

## 2020-09-27 DIAGNOSIS — N170 Acute kidney failure with tubular necrosis: Secondary | ICD-10-CM | POA: Diagnosis not present

## 2020-09-27 DIAGNOSIS — I129 Hypertensive chronic kidney disease with stage 1 through stage 4 chronic kidney disease, or unspecified chronic kidney disease: Secondary | ICD-10-CM | POA: Diagnosis not present

## 2020-09-27 DIAGNOSIS — I5032 Chronic diastolic (congestive) heart failure: Secondary | ICD-10-CM | POA: Diagnosis not present

## 2020-09-27 DIAGNOSIS — D638 Anemia in other chronic diseases classified elsewhere: Secondary | ICD-10-CM | POA: Diagnosis not present

## 2020-10-02 DIAGNOSIS — E43 Unspecified severe protein-calorie malnutrition: Secondary | ICD-10-CM | POA: Diagnosis not present

## 2020-10-02 DIAGNOSIS — Z299 Encounter for prophylactic measures, unspecified: Secondary | ICD-10-CM | POA: Diagnosis not present

## 2020-10-02 DIAGNOSIS — Z0000 Encounter for general adult medical examination without abnormal findings: Secondary | ICD-10-CM | POA: Diagnosis not present

## 2020-10-02 DIAGNOSIS — Z1331 Encounter for screening for depression: Secondary | ICD-10-CM | POA: Diagnosis not present

## 2020-10-02 DIAGNOSIS — Z1339 Encounter for screening examination for other mental health and behavioral disorders: Secondary | ICD-10-CM | POA: Diagnosis not present

## 2020-10-02 DIAGNOSIS — I10 Essential (primary) hypertension: Secondary | ICD-10-CM | POA: Diagnosis not present

## 2020-10-02 DIAGNOSIS — Z7189 Other specified counseling: Secondary | ICD-10-CM | POA: Diagnosis not present

## 2020-10-02 DIAGNOSIS — Z6820 Body mass index (BMI) 20.0-20.9, adult: Secondary | ICD-10-CM | POA: Diagnosis not present

## 2020-10-03 DIAGNOSIS — D649 Anemia, unspecified: Secondary | ICD-10-CM | POA: Diagnosis not present

## 2020-10-03 DIAGNOSIS — E7800 Pure hypercholesterolemia, unspecified: Secondary | ICD-10-CM | POA: Diagnosis not present

## 2020-10-03 DIAGNOSIS — Z79899 Other long term (current) drug therapy: Secondary | ICD-10-CM | POA: Diagnosis not present

## 2020-10-03 DIAGNOSIS — E041 Nontoxic single thyroid nodule: Secondary | ICD-10-CM | POA: Diagnosis not present

## 2020-10-04 DIAGNOSIS — N184 Chronic kidney disease, stage 4 (severe): Secondary | ICD-10-CM | POA: Diagnosis not present

## 2020-10-04 DIAGNOSIS — E1122 Type 2 diabetes mellitus with diabetic chronic kidney disease: Secondary | ICD-10-CM | POA: Diagnosis not present

## 2020-10-04 DIAGNOSIS — K567 Ileus, unspecified: Secondary | ICD-10-CM | POA: Diagnosis not present

## 2020-10-04 DIAGNOSIS — I5023 Acute on chronic systolic (congestive) heart failure: Secondary | ICD-10-CM | POA: Diagnosis not present

## 2020-10-04 DIAGNOSIS — Z48815 Encounter for surgical aftercare following surgery on the digestive system: Secondary | ICD-10-CM | POA: Diagnosis not present

## 2020-10-04 DIAGNOSIS — I130 Hypertensive heart and chronic kidney disease with heart failure and stage 1 through stage 4 chronic kidney disease, or unspecified chronic kidney disease: Secondary | ICD-10-CM | POA: Diagnosis not present

## 2020-10-09 ENCOUNTER — Telehealth (INDEPENDENT_AMBULATORY_CARE_PROVIDER_SITE_OTHER): Payer: Medicare Other | Admitting: General Surgery

## 2020-10-09 DIAGNOSIS — Z09 Encounter for follow-up examination after completed treatment for conditions other than malignant neoplasm: Secondary | ICD-10-CM

## 2020-10-09 NOTE — Telephone Encounter (Signed)
Postoperative visit performed with patient by telephone.  I did apologize to her that this call was several months after her laparoscopic appendectomy.  She states that was fine.  She is having bowel movements every other day.  She was wondering if she could take a stool softener.  I told her that was okay.  She is otherwise doing well.  I told her to call us should any further problems arise.  As this was a part of the global surgical fee, this was not a billable visit.  Total telephone time was 4-1/2 minutes.

## 2020-10-10 ENCOUNTER — Other Ambulatory Visit: Payer: Self-pay

## 2020-10-10 ENCOUNTER — Encounter (HOSPITAL_COMMUNITY)
Admission: RE | Admit: 2020-10-10 | Discharge: 2020-10-10 | Disposition: A | Discharge status: Home / Self Care | Payer: Medicare Other | Source: Ambulatory Visit | Attending: Nephrology | Admitting: Nephrology

## 2020-10-10 DIAGNOSIS — N185 Chronic kidney disease, stage 5: Secondary | ICD-10-CM | POA: Diagnosis not present

## 2020-10-10 DIAGNOSIS — I509 Heart failure, unspecified: Secondary | ICD-10-CM | POA: Diagnosis not present

## 2020-10-10 DIAGNOSIS — R809 Proteinuria, unspecified: Secondary | ICD-10-CM | POA: Diagnosis not present

## 2020-10-10 DIAGNOSIS — E871 Hypo-osmolality and hyponatremia: Secondary | ICD-10-CM | POA: Diagnosis not present

## 2020-10-10 DIAGNOSIS — E1122 Type 2 diabetes mellitus with diabetic chronic kidney disease: Secondary | ICD-10-CM | POA: Diagnosis not present

## 2020-10-10 DIAGNOSIS — I5032 Chronic diastolic (congestive) heart failure: Secondary | ICD-10-CM | POA: Diagnosis not present

## 2020-10-10 DIAGNOSIS — D631 Anemia in chronic kidney disease: Secondary | ICD-10-CM | POA: Insufficient documentation

## 2020-10-10 DIAGNOSIS — I129 Hypertensive chronic kidney disease with stage 1 through stage 4 chronic kidney disease, or unspecified chronic kidney disease: Secondary | ICD-10-CM | POA: Diagnosis not present

## 2020-10-10 DIAGNOSIS — D638 Anemia in other chronic diseases classified elsewhere: Secondary | ICD-10-CM | POA: Diagnosis not present

## 2020-10-10 DIAGNOSIS — N189 Chronic kidney disease, unspecified: Secondary | ICD-10-CM | POA: Diagnosis not present

## 2020-10-10 DIAGNOSIS — N170 Acute kidney failure with tubular necrosis: Secondary | ICD-10-CM | POA: Diagnosis not present

## 2020-10-10 LAB — CBC WITH DIFFERENTIAL/PLATELET
Abs Immature Granulocytes: 0.07 10*3/uL (ref 0.00–0.07)
Basophils Absolute: 0.1 10*3/uL (ref 0.0–0.1)
Basophils Relative: 1 %
Eosinophils Absolute: 0.2 10*3/uL (ref 0.0–0.5)
Eosinophils Relative: 2 %
HCT: 32.3 % — ABNORMAL LOW (ref 36.0–46.0)
Hemoglobin: 10.2 g/dL — ABNORMAL LOW (ref 12.0–15.0)
Immature Granulocytes: 1 %
Lymphocytes Relative: 22 %
Lymphs Abs: 1.6 10*3/uL (ref 0.7–4.0)
MCH: 27.4 pg (ref 26.0–34.0)
MCHC: 31.6 g/dL (ref 30.0–36.0)
MCV: 86.8 fL (ref 80.0–100.0)
Monocytes Absolute: 0.7 10*3/uL (ref 0.1–1.0)
Monocytes Relative: 10 %
Neutro Abs: 4.6 10*3/uL (ref 1.7–7.7)
Neutrophils Relative %: 64 %
Platelets: 243 10*3/uL (ref 150–400)
RBC: 3.72 MIL/uL — ABNORMAL LOW (ref 3.87–5.11)
RDW: 15.8 % — ABNORMAL HIGH (ref 11.5–15.5)
WBC: 7.2 10*3/uL (ref 4.0–10.5)
nRBC: 0 % (ref 0.0–0.2)

## 2020-10-10 LAB — RENAL FUNCTION PANEL
Albumin: 3.6 g/dL (ref 3.5–5.0)
Anion gap: 11 (ref 5–15)
BUN: 67 mg/dL — ABNORMAL HIGH (ref 8–23)
CO2: 25 mmol/L (ref 22–32)
Calcium: 9 mg/dL (ref 8.9–10.3)
Chloride: 97 mmol/L — ABNORMAL LOW (ref 98–111)
Creatinine, Ser: 2.48 mg/dL — ABNORMAL HIGH (ref 0.44–1.00)
GFR, Estimated: 19 mL/min — ABNORMAL LOW (ref >60)
Glucose, Bld: 133 mg/dL — ABNORMAL HIGH (ref 70–99)
Phosphorus: 4.1 mg/dL (ref 2.5–4.6)
Potassium: 3.7 mmol/L (ref 3.5–5.1)
Sodium: 133 mmol/L — ABNORMAL LOW (ref 135–145)

## 2020-10-10 LAB — POCT HEMOGLOBIN-HEMACUE: Hemoglobin: 10.3 g/dL — ABNORMAL LOW (ref 12.0–15.0)

## 2020-10-10 LAB — VITAMIN D 25 HYDROXY (VIT D DEFICIENCY, FRACTURES): Vit D, 25-Hydroxy: 63.61 ng/mL (ref 30–100)

## 2020-10-10 LAB — IRON AND TIBC
Iron: 60 ug/dL (ref 28–170)
Saturation Ratios: 21 % (ref 10.4–31.8)
TIBC: 285 ug/dL (ref 250–450)
UIBC: 225 ug/dL

## 2020-10-10 MED ORDER — EPOETIN ALFA-EPBX 3000 UNIT/ML IJ SOLN: 3000.0000 [IU] | Freq: Once | INTRAMUSCULAR | Status: DC

## 2020-10-11 LAB — PTH, INTACT AND CALCIUM
Calcium, Total (PTH): 9.4 mg/dL (ref 8.7–10.3)
PTH: 23 pg/mL (ref 15–65)

## 2020-10-18 ENCOUNTER — Other Ambulatory Visit: Payer: Self-pay | Admitting: *Deleted

## 2020-10-18 NOTE — Patient Outreach (Signed)
[facility]) Care Management  [place]  10/18/2020   Crystal [name] 02-18-1934 [id number]  Subjective: Telephone outreach to follow up on multiple co-morbidities.  Encounter Medications:  Outpatient Encounter Medications as of 10/18/2020  Medication Sig Note   acetaminophen (TYLENOL) 500 MG tablet Take 500 mg by mouth every 6 (six) hours as needed for headache (pain).    amiodarone (PACERONE) 200 MG tablet TAKE 2 TABLETS TWICE A DAY UNTIL 2/13 THEN REDUCE TO 1 TABLET DAILY.    carvedilol (COREG) 25 MG tablet TAKE 1 TABLET BY MOUTH 2 TIMES A DAY WITH MEALS.    cholecalciferol (VITAMIN D3) 25 MCG (1000 UT) tablet Take 1,000 Units by mouth daily with breakfast.    diazepam (VALIUM) 2 MG tablet Take 2 mg by mouth 2 (two) times daily as needed (dizzy spells.).    ELIQUIS 2.5 MG TABS tablet TAKE (1) TABLET TWICE DAILY.    epoetin alfa (EPOGEN) 3000 UNIT/ML injection Inject into the skin.    ferrous sulfate 325 (65 FE) MG tablet Take 325 mg by mouth daily before lunch. 05/18/2020: HAS RESTARTED ON 05/18/20   hydrALAZINE (APRESOLINE) 100 MG tablet TAKE 1 TABLET EVERY 8 HOURS    isosorbide mononitrate (IMDUR) 120 MG 24 hr tablet TAKE 1 TABLET BY MOUTH AT BEDTIME.    loperamide (IMODIUM) 2 MG capsule Take 2-4 mg by mouth 4 (four) times daily as needed for diarrhea or loose stools.    meclizine (ANTIVERT) 25 MG tablet Take 25 mg by mouth 2 (two) times daily as needed for dizziness.    Multiple Vitamin (MULTIVITAMIN WITH MINERALS) TABS tablet Take 1 tablet by mouth daily. Centrum Silver for Women    multivitamin-lutein (OCUVITE-LUTEIN) CAPS capsule Take 1 capsule by mouth daily before lunch.     nitroGLYCERIN (NITROSTAT) 0.4 MG SL tablet Place 1 tablet (0.4 mg total) under the tongue every 5 (five) minutes x 3 doses as needed for chest pain.    pantoprazole (PROTONIX) 40 MG tablet Take 1 tablet (40 mg total) by mouth daily. Take 30 minutes before breakfast    potassium  chloride SA (KLOR-CON) 20 MEQ tablet Take 3 tablets (60 mEq total) by mouth daily.    rosuvastatin (CRESTOR) 5 MG tablet Take 0.5 tablets (2.5 mg total) by mouth every other day.    saccharomyces boulardii (FLORASTOR) 250 MG capsule Take 250 mg by mouth 2 (two) times daily as needed (diarrhea).    torsemide (DEMADEX) 20 MG tablet Take 2 tablets (40 mg total) by mouth 2 (two) times daily. Take at breakfast and at Lunch    No facility-administered encounter medications on file as of 10/18/2020.    Functional Status:  In your present state of health, do you have any difficulty performing the following activities: 08/02/2020 04/12/2020  Hearing? N -  Vision? N -  Difficulty concentrating or making decisions? N -  Walking or climbing stairs? N -  Dressing or bathing? N -  Doing errands, shopping? N -  Preparing Food and eating ? - N  Comment - Late entry for 02/03/20. CCS  Using the Toilet? - N  Comment - Late entry for 02/03/20. CCS  In the past six months, have you accidently leaked urine? - Y  Comment - Late entry for 02/03/20. CCS  Do you have problems with loss of bowel control? - N  Comment - Late entry for 02/03/20. CCS  Managing your Medications? - N  Comment - Late entry for 02/03/20. CCS  Managing your Finances? - N  Comment - Late entry for 02/03/20. CCS  Housekeeping or managing your Housekeeping? - Y  Comment - Late entry for 02/03/20. CCS  Some recent data might be hidden    Fall/Depression Screening: Fall Risk  04/12/2020 03/04/2019  Falls in the past year? 0 0  Comment Late entry for 02/03/20. CCS -  Number falls in past yr: 0 -  Comment Late entry for 02/03/20. CCS -  Injury with Fall? 0 -  Comment Late entry for 02/03/20. CCS -  Risk for fall due to : Medication side effect -  Risk for fall due to: Comment Late entry for 02/03/20. CCS -  Follow up Falls evaluation completed -  Comment Late entry for 02/03/20. CCS -   PHQ 2/9 Scores 05/01/2020 04/12/2020 03/04/2019  PHQ - 2 Score 0 2 0  PHQ-  9 Score - 4 -    Assessment: HF stable   CKD Stage IV   Wt loss  Care Plan  Goals Addressed             This Visit's Progress    Comorbidities Identified and Managed and documented with pt self management reported each month over the next 3 months.       Pt goal started: 04/04/20 Long term goal High priority Follow up 10/18/20 Expected completion; 07/26/20, Renewed for another 3 months 10/26/20. Barriers: Health Behaviors Knowledge     Notes: 04/04/20 Other comorbidities to monitor: DM, CKD, CAD. Chart review revealed Diabetes: Lipids were normal in January! HgbA1C was 6.0 in December! Does not check her glucose at home very often as this is well controlled. Reports she had a foot exam last fall, will have her eye exam: tomorrow.  CKD: labs ordered by nephrology, will get labs tomorrow and pick up jug for 24 hour urine collection. Next appt is last week of Marchl for follow up and discussion regarding lab results. No new CAD sxs and follow up with cardiology scheduled for May. 05/18/20 Labs recently done. CBC improving. Labs ordered by hematologist are mostly out of range. Dr. Leron Croak is following closely. Pt is getting iron infusions. Reviewed safety measures since pt is back at her home. She is getting around better all the time and she is starting to drive again and feels confident. She is extremely cautious when ambulating and avoids taking risks like stepping up on a curb. She will go up the curb cutout. Reinforced safety as #1 and notifying MD of any problems early. 06/18/20 Pt is feeling the best she has felt, HF more stable, CKD also stable back to stage III. She is able to do her housework slowly as she tires easily. She says she cannot bend over and clean her bath tub. Suggested she use a mop and that way she doesn't need to bend over. Her thyroid levels were recently checked and that is normal range. was 9.9.07/19/20 Saw GI and she is stable, no dx scheduled. She reports senile purpura  exacerbated by being on Eliquis and complains about the expense. Advised she should hold pressure over areas she knows she has bumped to minimize the superficial bruising. Suggested we request an application for pharmacy assistance for the Eliquis and she is in agreement. Will refer. Encouraged to call for any issues for prompt intervention and to avoid complications. She did have an iron infusion yesterday as her hgb 08/16/20 New problem illeus, requiring exploratory lab and discovery of gangrenous appendix. Ileus resolved. Home now with nepthew.  Able to eat soft foods, having formed stools. Will have surgical follow up. 08/20/20 CKD-had f/u with nephrologist 7/22, he increased torsemide to address wt gain, low na, HTN. Pt is not back on track with weighing daily since she came home from the hospital last week. Reinforced that is #1 self care action to resume and to advise MD or NP if wt increases 3-5#. Hgb low and she will resume her Epogen injections. Will see primary care via televisit this week, cardiology 09/08/20. GI follow up on 01/08/21. 09/03/20 Pt had OV with primary last week, Hgb low and had to have 2 units of blood. She will have her epogen injection tomorrow. Praised for making her appt. Encouraged continued self care. 09/18/20 Improved general well-being with having blood, increasing diuretic and reducing her beta blocker dose. Praised for how she is coping and managing her multiple health issues! 10/18/20 Chronic illnesses at this time seem to be stable. What is of concern at this time is her significant wt loss. She has been advised to drink one boost a day and continue to have 3 meals a day. New goal started today regarding wt maintenance.      Pt weight will be maintained > 105# over the next 3 month.       Start date: 10/18/20 Expected end date: 01/25/21 High Priority Long term Barriers: Other: fair appetite  Notes: 10/18/20 Pt reports additional weight loss of 10# per her home scale to 109#  over last month.  The scale battery has been changed. Her MD office visits are registering higher including Dr. Toya Smothers office on 9/14 was 116# which represents an 18# loss (134#) since she had GI surgery in July. Encouraged 3 meals, snacks and her Boost daily.          Plan:  Follow-up: Patient agrees to Care Plan and Follow-up. Follow-up in 1 month(s)  Peggyann Zwiefelhofer C. [name], MSN, [facility] Gerontological Nurse Practitioner [facility] Care Management [phone]

## 2020-10-24 ENCOUNTER — Encounter (HOSPITAL_COMMUNITY)
Admission: RE | Admit: 2020-10-24 | Discharge: 2020-10-24 | Disposition: A | Discharge status: Home / Self Care | Payer: Medicare Other | Source: Ambulatory Visit | Attending: Nephrology | Admitting: Nephrology

## 2020-10-24 ENCOUNTER — Other Ambulatory Visit: Payer: Self-pay

## 2020-10-24 DIAGNOSIS — D631 Anemia in chronic kidney disease: Secondary | ICD-10-CM | POA: Diagnosis not present

## 2020-10-24 DIAGNOSIS — I509 Heart failure, unspecified: Secondary | ICD-10-CM | POA: Diagnosis not present

## 2020-10-24 DIAGNOSIS — N185 Chronic kidney disease, stage 5: Secondary | ICD-10-CM | POA: Diagnosis not present

## 2020-10-24 DIAGNOSIS — R809 Proteinuria, unspecified: Secondary | ICD-10-CM | POA: Diagnosis not present

## 2020-10-24 DIAGNOSIS — E1122 Type 2 diabetes mellitus with diabetic chronic kidney disease: Secondary | ICD-10-CM | POA: Diagnosis not present

## 2020-10-24 LAB — POCT HEMOGLOBIN-HEMACUE: Hemoglobin: 12.2 g/dL (ref 12.0–15.0)

## 2020-10-26 DIAGNOSIS — E7800 Pure hypercholesterolemia, unspecified: Secondary | ICD-10-CM | POA: Diagnosis not present

## 2020-10-26 DIAGNOSIS — I10 Essential (primary) hypertension: Secondary | ICD-10-CM | POA: Diagnosis not present

## 2020-10-30 DIAGNOSIS — Z1231 Encounter for screening mammogram for malignant neoplasm of breast: Secondary | ICD-10-CM | POA: Diagnosis not present

## 2020-11-07 ENCOUNTER — Other Ambulatory Visit: Payer: Self-pay

## 2020-11-07 ENCOUNTER — Encounter (HOSPITAL_COMMUNITY)
Admission: RE | Admit: 2020-11-07 | Discharge: 2020-11-07 | Disposition: A | Discharge status: Home / Self Care | Payer: Medicare Other | Source: Ambulatory Visit | Attending: Nephrology | Admitting: Nephrology

## 2020-11-07 ENCOUNTER — Encounter (HOSPITAL_COMMUNITY): Payer: Self-pay

## 2020-11-07 DIAGNOSIS — E1122 Type 2 diabetes mellitus with diabetic chronic kidney disease: Secondary | ICD-10-CM | POA: Diagnosis not present

## 2020-11-07 DIAGNOSIS — I129 Hypertensive chronic kidney disease with stage 1 through stage 4 chronic kidney disease, or unspecified chronic kidney disease: Secondary | ICD-10-CM | POA: Insufficient documentation

## 2020-11-07 DIAGNOSIS — I5032 Chronic diastolic (congestive) heart failure: Secondary | ICD-10-CM | POA: Insufficient documentation

## 2020-11-07 DIAGNOSIS — N184 Chronic kidney disease, stage 4 (severe): Secondary | ICD-10-CM | POA: Diagnosis not present

## 2020-11-07 DIAGNOSIS — E871 Hypo-osmolality and hyponatremia: Secondary | ICD-10-CM | POA: Insufficient documentation

## 2020-11-07 DIAGNOSIS — D631 Anemia in chronic kidney disease: Secondary | ICD-10-CM | POA: Insufficient documentation

## 2020-11-07 LAB — POCT HEMOGLOBIN-HEMACUE: Hemoglobin: 9.8 g/dL — ABNORMAL LOW (ref 12.0–15.0)

## 2020-11-07 MED ORDER — EPOETIN ALFA-EPBX 3000 UNIT/ML IJ SOLN
INTRAMUSCULAR | Status: AC
  Filled 2020-11-07: qty 1

## 2020-11-07 MED ORDER — EPOETIN ALFA-EPBX 3000 UNIT/ML IJ SOLN
3000.0000 [IU] | Freq: Once | INTRAMUSCULAR | Status: AC
Start: 2020-11-07 — End: 2020-11-07
  Administered 2020-11-07: 3000 [IU] via SUBCUTANEOUS

## 2020-11-15 ENCOUNTER — Other Ambulatory Visit: Payer: Self-pay | Admitting: *Deleted

## 2020-11-15 NOTE — Patient Outreach (Signed)
[facility]) Care Management  [place]  11/15/2020   Daylynn Stumpp Bonner [birth year] [id number]  Subjective: Telephone outreach for follow up multiple co-morbidities and wt loss.  Encounter Medications:  Outpatient Encounter Medications as of 11/15/2020  Medication Sig Note   acetaminophen (TYLENOL) 500 MG tablet Take 500 mg by mouth every 6 (six) hours as needed for headache (pain).    amiodarone (PACERONE) 200 MG tablet TAKE 2 TABLETS TWICE A DAY UNTIL 2/13 THEN REDUCE TO 1 TABLET DAILY.    carvedilol (COREG) 25 MG tablet TAKE 1 TABLET BY MOUTH 2 TIMES A DAY WITH MEALS.    cholecalciferol (VITAMIN D3) 25 MCG (1000 UT) tablet Take 1,000 Units by mouth daily with breakfast.    diazepam (VALIUM) 2 MG tablet Take 2 mg by mouth 2 (two) times daily as needed (dizzy spells.).    ELIQUIS 2.5 MG TABS tablet TAKE (1) TABLET TWICE DAILY.    epoetin alfa (EPOGEN) 3000 UNIT/ML injection Inject into the skin.    ferrous sulfate 325 (65 FE) MG tablet Take 325 mg by mouth daily before lunch. 05/18/2020: HAS RESTARTED ON 05/18/20   hydrALAZINE (APRESOLINE) 100 MG tablet TAKE 1 TABLET EVERY 8 HOURS    isosorbide mononitrate (IMDUR) 120 MG 24 hr tablet TAKE 1 TABLET BY MOUTH AT BEDTIME.    loperamide (IMODIUM) 2 MG capsule Take 2-4 mg by mouth 4 (four) times daily as needed for diarrhea or loose stools.    meclizine (ANTIVERT) 25 MG tablet Take 25 mg by mouth 2 (two) times daily as needed for dizziness.    Multiple Vitamin (MULTIVITAMIN WITH MINERALS) TABS tablet Take 1 tablet by mouth daily. Centrum Silver for Women    multivitamin-lutein (OCUVITE-LUTEIN) CAPS capsule Take 1 capsule by mouth daily before lunch.     nitroGLYCERIN (NITROSTAT) 0.4 MG SL tablet Place 1 tablet (0.4 mg total) under the tongue every 5 (five) minutes x 3 doses as needed for chest pain.    pantoprazole (PROTONIX) 40 MG tablet Take 1 tablet (40 mg total) by mouth daily. Take 30 minutes before breakfast     potassium chloride SA (KLOR-CON) 20 MEQ tablet Take 3 tablets (60 mEq total) by mouth daily.    rosuvastatin (CRESTOR) 5 MG tablet Take 0.5 tablets (2.5 mg total) by mouth every other day.    saccharomyces boulardii (FLORASTOR) 250 MG capsule Take 250 mg by mouth 2 (two) times daily as needed (diarrhea).    torsemide (DEMADEX) 20 MG tablet Take 2 tablets (40 mg total) by mouth 2 (two) times daily. Take at breakfast and at Lunch    No facility-administered encounter medications on file as of 11/15/2020.    Functional Status:  In your present state of health, do you have any difficulty performing the following activities: 08/02/2020 04/12/2020  Hearing? N -  Vision? N -  Difficulty concentrating or making decisions? N -  Walking or climbing stairs? N -  Dressing or bathing? N -  Doing errands, shopping? N -  Preparing Food and eating ? - N  Comment - Late entry for 02/03/20. CCS  Using the Toilet? - N  Comment - Late entry for 02/03/20. CCS  In the past six months, have you accidently leaked urine? - Y  Comment - Late entry for 02/03/20. CCS  Do you have problems with loss of bowel control? - N  Comment - Late entry for 02/03/20. CCS  Managing your Medications? - N  Comment - Late entry for 02/03/20.  CCS  Managing your Finances? - N  Comment - Late entry for 02/03/20. CCS  Housekeeping or managing your Housekeeping? - Y  Comment - Late entry for 02/03/20. CCS  Some recent data might be hidden    Fall/Depression Screening: Fall Risk  04/12/2020 03/04/2019  Falls in the past year? 0 0  Comment Late entry for 02/03/20. CCS -  Number falls in past yr: 0 -  Comment Late entry for 02/03/20. CCS -  Injury with Fall? 0 -  Comment Late entry for 02/03/20. CCS -  Risk for fall due to : Medication side effect -  Risk for fall due to: Comment Late entry for 02/03/20. CCS -  Follow up Falls evaluation completed -  Comment Late entry for 02/03/20. CCS -   PHQ 2/9 Scores 11/15/2020 05/01/2020 04/12/2020 03/04/2019  PHQ -  2 Score 0 0 2 0  PHQ- 9 Score - - 4 -    Assessment: Stable HF, CKD, Anemia                         Improved nutritional status with wt gain 8#  Care Plan   Goals Addressed             This Visit's Progress    Comorbidities Identified and Managed and documented with pt self management reported each month over the next 3 months.       Pt goal started: 04/04/20 Long term goal High priority Follow up 12/13/20 Expected completion; 07/26/20, Renewed for another 3 months 10/26/20, ongoing goal while pt is active. Barriers: previous barriers have been resolved (11/15/20) Health Behaviors Knowledge     Notes: 04/04/20 Other comorbidities to monitor: DM, CKD, CAD. Chart review revealed Diabetes: Lipids were normal in January! HgbA1C was 6.0 in December! Does not check her glucose at home very often as this is well controlled. Reports she had a foot exam last fall, will have her eye exam: tomorrow.  CKD: labs ordered by nephrology, will get labs tomorrow and pick up jug for 24 hour urine collection. Next appt is last week of Marchl for follow up and discussion regarding lab results. No new CAD sxs and follow up with cardiology scheduled for May. 05/18/20 Labs recently done. CBC improving. Labs ordered by hematologist are mostly out of range. Dr. Leron Croak is following closely. Pt is getting iron infusions. Reviewed safety measures since pt is back at her home. She is getting around better all the time and she is starting to drive again and feels confident. She is extremely cautious when ambulating and avoids taking risks like stepping up on a curb. She will go up the curb cutout. Reinforced safety as #1 and notifying MD of any problems early. 06/18/20 Pt is feeling the best she has felt, HF more stable, CKD also stable back to stage III. She is able to do her housework slowly as she tires easily. She says she cannot bend over and clean her bath tub. Suggested she use a mop and that way she doesn't need to  bend over. Her thyroid levels were recently checked and that is normal range. was 9.9.07/19/20 Saw GI and she is stable, no dx scheduled. 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 Advised she should hold pressure over areas she knows she has bumped to minimize the superficial bruising. Suggested we request an application for pharmacy assistance for the Eliquis and she is in agreement. Will  refer. Encouraged to call for any issues for prompt intervention and to avoid complications. She did have an iron infusion yesterday as her hgb 08/16/20 New problem illeus, requiring exploratory lab and discovery of gangrenous appendix. Ileus resolved. Home now with nepthew. Able to eat soft foods, having formed stools. Will have surgical follow up. 08/20/20 CKD-had f/u with nephrologist 7/22, he increased torsemide to address wt gain, low na, HTN. Pt is not back on track with weighing daily since she came home from the hospital last week. Reinforced that is #1 self care action to resume and to advise MD or NP if wt increases 3-5#. Hgb low and she will resume her Epogen injections. Will see primary care via televisit this week, cardiology 09/08/20. GI follow up on 01/08/21. 09/03/20 Pt had OV with primary last week, Hgb low and had to have 2 units of blood. She will have her epogen injection tomorrow. Praised for making her appt. Encouraged continued self care. 09/18/20 Improved general well-being with having blood, increasing diuretic and reducing her beta blocker dose. Praised for how she is coping and managing her multiple health issues! 10/18/20 Chronic illnesses at this time seem to be stable. What is of concern at this time is her significant wt loss. She has been advised to drink one boost a day and continue to have 3 meals a day. New goal started today regarding wt maintenance. 11/15/20 Stable HF, CKD, Anemia. Following up with nepthrology next week and  Cardiology 12/04/20. Has  hgb checked every other week for anemia. Very good self management. Pt praised for her coping skills and following her action plans.     Pt weight will be maintained > 105# over the next 3 month.       Start date: 10/18/20 Expected end date: 01/25/21 High Priority Long term Barriers: Other: fair appetite  Notes: 11/15/20 Pt wt is up to 114.3 with greater vigilance on increased nutritional needs and drinking supplement. Also did PHQ2 today which was negative for depression at this time. Pt reports has plenty of food but healthy foods are more expensive. She also reports her wardrobe is all too big.  10/18/20 Pt reports additional weight loss of 10# per her home scale to 109# over last month.  The scale battery has been changed. Her MD office visits are registering higher including Dr. Toya Smothers office on 9/14 was 116# which represents an 18# loss (134#) since she had GI surgery in July. Encouraged 3 meals, snacks and her Boost daily.          Plan:  Follow-up: Patient agrees to Care Plan and Follow-up. Follow-up in 1 month(s)  Icel Castles C. [name], MSN, [facility] Gerontological Nurse Practitioner [facility] Care Management [phone]

## 2020-11-21 ENCOUNTER — Other Ambulatory Visit: Payer: Self-pay

## 2020-11-21 ENCOUNTER — Encounter (HOSPITAL_COMMUNITY)
Admission: RE | Admit: 2020-11-21 | Discharge: 2020-11-21 | Disposition: A | Discharge status: Home / Self Care | Payer: Medicare Other | Source: Ambulatory Visit | Attending: Nephrology | Admitting: Nephrology

## 2020-11-21 DIAGNOSIS — E1122 Type 2 diabetes mellitus with diabetic chronic kidney disease: Secondary | ICD-10-CM | POA: Diagnosis not present

## 2020-11-21 DIAGNOSIS — I5032 Chronic diastolic (congestive) heart failure: Secondary | ICD-10-CM | POA: Diagnosis not present

## 2020-11-21 DIAGNOSIS — I129 Hypertensive chronic kidney disease with stage 1 through stage 4 chronic kidney disease, or unspecified chronic kidney disease: Secondary | ICD-10-CM | POA: Diagnosis not present

## 2020-11-21 DIAGNOSIS — E871 Hypo-osmolality and hyponatremia: Secondary | ICD-10-CM | POA: Diagnosis not present

## 2020-11-21 DIAGNOSIS — N184 Chronic kidney disease, stage 4 (severe): Secondary | ICD-10-CM | POA: Diagnosis not present

## 2020-11-21 DIAGNOSIS — D631 Anemia in chronic kidney disease: Secondary | ICD-10-CM | POA: Diagnosis not present

## 2020-11-21 LAB — CBC WITH DIFFERENTIAL/PLATELET
Abs Immature Granulocytes: 0.04 10*3/uL (ref 0.00–0.07)
Basophils Absolute: 0 10*3/uL (ref 0.0–0.1)
Basophils Relative: 1 %
Eosinophils Absolute: 0.1 10*3/uL (ref 0.0–0.5)
Eosinophils Relative: 2 %
HCT: 33.2 % — ABNORMAL LOW (ref 36.0–46.0)
Hemoglobin: 10.5 g/dL — ABNORMAL LOW (ref 12.0–15.0)
Immature Granulocytes: 1 %
Lymphocytes Relative: 21 %
Lymphs Abs: 1.2 10*3/uL (ref 0.7–4.0)
MCH: 27.9 pg (ref 26.0–34.0)
MCHC: 31.6 g/dL (ref 30.0–36.0)
MCV: 88.1 fL (ref 80.0–100.0)
Monocytes Absolute: 0.6 10*3/uL (ref 0.1–1.0)
Monocytes Relative: 11 %
Neutro Abs: 3.7 10*3/uL (ref 1.7–7.7)
Neutrophils Relative %: 64 %
Platelets: 242 10*3/uL (ref 150–400)
RBC: 3.77 MIL/uL — ABNORMAL LOW (ref 3.87–5.11)
RDW: 17.1 % — ABNORMAL HIGH (ref 11.5–15.5)
WBC: 5.8 10*3/uL (ref 4.0–10.5)
nRBC: 0 % (ref 0.0–0.2)

## 2020-11-21 LAB — RENAL FUNCTION PANEL
Albumin: 3.9 g/dL (ref 3.5–5.0)
Anion gap: 8 (ref 5–15)
BUN: 53 mg/dL — ABNORMAL HIGH (ref 8–23)
CO2: 28 mmol/L (ref 22–32)
Calcium: 9.2 mg/dL (ref 8.9–10.3)
Chloride: 99 mmol/L (ref 98–111)
Creatinine, Ser: 2.74 mg/dL — ABNORMAL HIGH (ref 0.44–1.00)
GFR, Estimated: 17 mL/min — ABNORMAL LOW (ref >60)
Glucose, Bld: 113 mg/dL — ABNORMAL HIGH (ref 70–99)
Phosphorus: 4.1 mg/dL (ref 2.5–4.6)
Potassium: 3.2 mmol/L — ABNORMAL LOW (ref 3.5–5.1)
Sodium: 135 mmol/L (ref 135–145)

## 2020-11-21 LAB — POCT HEMOGLOBIN-HEMACUE: Hemoglobin: 10.2 g/dL — ABNORMAL LOW (ref 12.0–15.0)

## 2020-11-21 LAB — PROTEIN / CREATININE RATIO, URINE
Creatinine, Urine: 35.53 mg/dL
Protein Creatinine Ratio: 0.2 mg/mg{Cre} — ABNORMAL HIGH (ref 0.00–0.15)
Total Protein, Urine: 7 mg/dL

## 2020-11-21 LAB — IRON AND TIBC
Iron: 57 ug/dL (ref 28–170)
Saturation Ratios: 17 % (ref 10.4–31.8)
TIBC: 333 ug/dL (ref 250–450)
UIBC: 276 ug/dL

## 2020-11-21 MED ORDER — EPOETIN ALFA-EPBX 3000 UNIT/ML IJ SOLN
3000.0000 [IU] | Freq: Once | INTRAMUSCULAR | Status: DC
Start: 2020-11-21 — End: 2020-11-21

## 2020-11-22 DIAGNOSIS — N189 Chronic kidney disease, unspecified: Secondary | ICD-10-CM | POA: Diagnosis not present

## 2020-11-22 DIAGNOSIS — E876 Hypokalemia: Secondary | ICD-10-CM | POA: Diagnosis not present

## 2020-11-22 DIAGNOSIS — D472 Monoclonal gammopathy: Secondary | ICD-10-CM | POA: Diagnosis not present

## 2020-11-22 DIAGNOSIS — R768 Other specified abnormal immunological findings in serum: Secondary | ICD-10-CM | POA: Diagnosis not present

## 2020-11-22 DIAGNOSIS — E1129 Type 2 diabetes mellitus with other diabetic kidney complication: Secondary | ICD-10-CM | POA: Diagnosis not present

## 2020-11-22 DIAGNOSIS — R809 Proteinuria, unspecified: Secondary | ICD-10-CM | POA: Diagnosis not present

## 2020-11-22 DIAGNOSIS — D638 Anemia in other chronic diseases classified elsewhere: Secondary | ICD-10-CM | POA: Diagnosis not present

## 2020-11-22 DIAGNOSIS — E1122 Type 2 diabetes mellitus with diabetic chronic kidney disease: Secondary | ICD-10-CM | POA: Diagnosis not present

## 2020-11-22 DIAGNOSIS — I5032 Chronic diastolic (congestive) heart failure: Secondary | ICD-10-CM | POA: Diagnosis not present

## 2020-11-22 DIAGNOSIS — I129 Hypertensive chronic kidney disease with stage 1 through stage 4 chronic kidney disease, or unspecified chronic kidney disease: Secondary | ICD-10-CM | POA: Diagnosis not present

## 2020-11-22 LAB — PTH, INTACT AND CALCIUM
Calcium, Total (PTH): 9.5 mg/dL (ref 8.7–10.3)
PTH: 47 pg/mL (ref 15–65)

## 2020-11-27 LAB — 25-HYDROXY VITAMIN D LCMS D2+D3
25-Hydroxy, Vitamin D-2: <1 ng/mL
25-Hydroxy, Vitamin D-3: 81 ng/mL
25-Hydroxy, Vitamin D: 81 ng/mL

## 2020-12-04 ENCOUNTER — Ambulatory Visit (INDEPENDENT_AMBULATORY_CARE_PROVIDER_SITE_OTHER): Payer: Medicare Other | Admitting: Cardiology

## 2020-12-04 ENCOUNTER — Encounter: Payer: Self-pay | Admitting: Cardiology

## 2020-12-04 VITALS — BP 150/63 | HR 55 | Ht 65.0 in | Wt 118.6 lb

## 2020-12-04 DIAGNOSIS — Z8679 Personal history of other diseases of the circulatory system: Secondary | ICD-10-CM

## 2020-12-04 DIAGNOSIS — I25119 Atherosclerotic heart disease of native coronary artery with unspecified angina pectoris: Secondary | ICD-10-CM | POA: Diagnosis not present

## 2020-12-04 DIAGNOSIS — N184 Chronic kidney disease, stage 4 (severe): Secondary | ICD-10-CM

## 2020-12-04 DIAGNOSIS — I255 Ischemic cardiomyopathy: Secondary | ICD-10-CM | POA: Diagnosis not present

## 2020-12-04 MED ORDER — CARVEDILOL 12.5 MG PO TABS: 12.5000 mg | ORAL_TABLET | Freq: Two times a day (BID) | ORAL | 1 refills | Status: DC

## 2020-12-04 NOTE — Patient Instructions (Signed)
Medication Instructions:  Your physician has recommended you make the following change in your medication:  Decrease carvedilol to 12.5 mg twice daily Continue other medications the same  Labwork: none  Testing/Procedures: none  Follow-Up: Your physician recommends that you schedule a follow-up appointment in: 3 months  Any Other Special Instructions Will Be Listed Below (If Applicable).  If you need a refill on your cardiac medications before your next appointment, please call your pharmacy.

## 2020-12-04 NOTE — Progress Notes (Signed)
Cardiology Office Note  Date: 12/04/2020   ID: Crystal [name], DOB [birth year], MRN [id number]  PCP:  Glenda Chroman, MD  Cardiologist:  Rozann Lesches, MD Electrophysiologist:  None   Chief Complaint  Patient presents with   Cardiac follow-up     History of Present Illness: Crystal [name] is an 86 y.o. female last seen in August by Ms. Strader PA-C.  She is here for a routine visit.  She has lost about 10 pounds in fluid weight and states that her leg swelling has resolved.  She continues to see Dr. Theador Hawthorne for nephrology follow-up, I reviewed the recent note from late October.  She has CKD stage IV, recent creatinine up to 2.74.  Diuretics have been modified, she is currently on Demadex at 50 mg daily (reduction in dose) along with potassium supplement.  We went over the remainder of her medications.  She has only been taking her Coreg 25 mg once a day.  Heart rate is in the 50s and sinus rhythm by ECG today.  Past Medical History:  Diagnosis Date   Anemia    Bell's palsy    Breast cancer ([place]) 1998   Right mastectomy   CAD (coronary artery disease)    a. s/p STEMI in 01/2019 with DES to LAD b. s/p NSTEMI in 12/2019 with DES to RCA c. cath in 01/2020 showing patent stents   CHF (congestive heart failure) ([place])    a. EF 30-35% in 01/2019 b. 40-45% in 01/2020 c. EF at 65-70% by echo in 05/2020   Chronic kidney disease    Essential hypertension    GERD (gastroesophageal reflux disease)    Gout    History of skin cancer    Squamous cell, left shoulder   Mixed hyperlipidemia    Osteopenia    Paroxysmal atrial fibrillation (HCC)    Thyroid nodule    Type 2 diabetes mellitus ([place])     Past Surgical History:  Procedure Laterality Date   BIOPSY  10/13/2019   Procedure: BIOPSY;  Surgeon: Daneil Dolin, MD;  [facility];  Service: Endoscopy;;   CATARACT EXTRACTION  2016   COLONOSCOPY  2019   Dr Anthony Sar   CORONARY ANGIOGRAPHY N/A 01/16/2020    Procedure: CORONARY ANGIOGRAPHY;  Surgeon: Jettie Booze, MD;  Location: [place] [facility];  Service: Cardiovascular;  Laterality: N/A;   CORONARY STENT INTERVENTION N/A 01/16/2020   Procedure: CORONARY STENT INTERVENTION;  Surgeon: Jettie Booze, MD;  Location: [place] [facility];  Service: Cardiovascular;  Laterality: N/A;   CORONARY/GRAFT ACUTE MI REVASCULARIZATION N/A 01/30/2019   Procedure: Coronary/Graft Acute MI Revascularization;  Surgeon: Burnell Blanks, MD;  Location: [place] [facility];  Service: Cardiovascular;  Laterality: N/A;   ESOPHAGOGASTRODUODENOSCOPY (EGD) WITH PROPOFOL N/A 10/13/2019   Non-obstructing Schatzki ring at GE junction, s/p dilation, erosive gastropathy with stigmata of recent bleeding, normal duodenum. Negative H.pylori.    HYSTEROSCOPY     INTRAVASCULAR ULTRASOUND/IVUS N/A 01/16/2020   Procedure: Intravascular Ultrasound/IVUS;  Surgeon: Jettie Booze, MD;  Location: [place] [facility];  Service: Cardiovascular;  Laterality: N/A;   LAPAROSCOPIC APPENDECTOMY N/A 08/03/2020   Procedure: APPENDECTOMY LAPAROSCOPIC;  Surgeon: Ronny Bacon, MD;  Location: [facility];  Service: General;  Laterality: N/A;   LEFT HEART CATH AND CORONARY ANGIOGRAPHY N/A 01/30/2019   Procedure: LEFT HEART CATH AND CORONARY ANGIOGRAPHY;  Surgeon: Burnell Blanks, MD;  Location: [place] [facility];  Service: Cardiovascular;  Laterality:  N/A;   LEFT HEART CATH AND CORONARY ANGIOGRAPHY N/A 01/16/2020   Procedure: LEFT HEART CATH AND CORONARY ANGIOGRAPHY;  Surgeon: Jettie Booze, MD;  Location: [place] [facility];  Service: Cardiovascular;  Laterality: N/A;   MALONEY DILATION N/A 10/13/2019   Procedure: Venia Minks DILATION;  Surgeon: Daneil Dolin, MD;  [facility];  Service: Endoscopy;  Laterality: N/A;   Right mastectomy  1998   [place]   RIGHT/LEFT HEART CATH AND CORONARY ANGIOGRAPHY N/A 02/13/2020   Procedure: RIGHT/LEFT HEART CATH AND CORONARY  ANGIOGRAPHY;  Surgeon: Martinique, Peter M, MD;  Location: [place] [facility];  Service: Cardiovascular;  Laterality: N/A;    Current Outpatient Medications  Medication Sig Dispense Refill   acetaminophen (TYLENOL) 500 MG tablet Take 500 mg by mouth every 6 (six) hours as needed for headache (pain).     amiodarone (PACERONE) 200 MG tablet TAKE 2 TABLETS TWICE A DAY UNTIL 2/13 THEN REDUCE TO 1 TABLET DAILY. 60 tablet 6   carvedilol (COREG) 12.5 MG tablet Take 1 tablet (12.5 mg total) by mouth 2 (two) times daily. 180 tablet 1   cholecalciferol (VITAMIN D3) 25 MCG (1000 UT) tablet Take 1,000 Units by mouth daily with breakfast.     ELIQUIS 2.5 MG TABS tablet TAKE (1) TABLET TWICE DAILY. 60 tablet 5   ferrous sulfate 325 (65 FE) MG tablet Take 325 mg by mouth daily before lunch.     hydrALAZINE (APRESOLINE) 100 MG tablet Take 100 mg by mouth in the morning, at noon, and at bedtime.     isosorbide mononitrate (IMDUR) 120 MG 24 hr tablet TAKE 1 TABLET BY MOUTH AT BEDTIME. 30 tablet 6   minoxidil (LONITEN) 2.5 MG tablet Take 2.5 mg by mouth daily as needed.     Multiple Vitamin (MULTIVITAMIN WITH MINERALS) TABS tablet Take 1 tablet by mouth daily. Centrum Silver for Women     multivitamin-lutein (OCUVITE-LUTEIN) CAPS capsule Take 1 capsule by mouth daily before lunch.      pantoprazole (PROTONIX) 40 MG tablet Take 1 tablet (40 mg total) by mouth daily. Take 30 minutes before breakfast 90 tablet 3   potassium chloride (KLOR-CON) 10 MEQ tablet Take 10 mEq by mouth 4 (four) times daily.     rosuvastatin (CRESTOR) 5 MG tablet Take 0.5 tablets (2.5 mg total) by mouth every other day. 30 tablet 0   torsemide (DEMADEX) 100 MG tablet Take 100 mg by mouth once. Patient takes a 1/2 tablet     nitroGLYCERIN (NITROSTAT) 0.4 MG SL tablet Place 1 tablet (0.4 mg total) under the tongue every 5 (five) minutes x 3 doses as needed for chest pain. 25 tablet 2   No current facility-administered medications for this visit.    Allergies:  Bactrim [sulfamethoxazole-trimethoprim], Sulfa antibiotics, Amlodipine, Clonidine derivatives, Evista [raloxifene hcl], Fosamax [alendronate sodium], Glipizide, Lipitor [atorvastatin calcium], Losartan, Pravastatin, and Azithromycin   ROS: No orthopnea or PND.  Physical Exam: VS:  BP (!) 150/63 (BP Location: Left Arm, Patient Position: Sitting, Cuff Size: Normal)   Pulse (!) 55   Ht 5\' 5"  (1.651 m)   Wt 118 lb 9.6 oz (53.8 kg)   SpO2 98%   BMI 19.74 kg/m , BMI Body mass index is 19.74 kg/m.  Wt Readings from Last 3 Encounters:  12/04/20 118 lb 9.6 oz (53.8 kg)  11/07/20 128 lb 1.4 oz (58.1 kg)  09/07/20 128 lb (58.1 kg)    General: Patient appears comfortable at rest. HEENT: Conjunctiva and lids  normal, wearing a mask. Neck: Supple, no elevated JVP or carotid bruits, no thyromegaly. Lungs: Decreased basilar breath sounds, nonlabored breathing at rest. Cardiac: Regular rate and rhythm, no S3, 2/6 systolic murmur. Extremities: No pitting edema.  ECG:  An ECG dated 02/24/2020 was personally reviewed today and demonstrated:  Atrial fibrillation with nonspecific T wave changes.  Recent Labwork: 02/14/2020: TSH 2.238 02/23/2020: B Natriuretic Peptide 3,507.5 08/02/2020: ALT 30; AST 30 08/10/2020: Magnesium 1.7 11/21/2020: BUN 53; Creatinine, Ser 2.74; Hemoglobin 10.2; Platelets 242; Potassium 3.2; Sodium 135     Component Value Date/Time   CHOL 155 02/14/2020 0339   TRIG 74 02/14/2020 0339   HDL 57 02/14/2020 0339   CHOLHDL 2.7 02/14/2020 0339   VLDL 15 02/14/2020 0339   LDLCALC 83 02/14/2020 0339    Other Studies Reviewed Today:  Echocardiogram 06/05/2020:  1. Left ventricular ejection fraction, by estimation, is 65 to 70%. The  left ventricle has normal function. The left ventricle has no regional  wall motion abnormalities. There is mild left ventricular hypertrophy.  Left ventricular diastolic parameters  are consistent with Grade II diastolic dysfunction  (pseudonormalization).   2. Right ventricular systolic function is normal. The right ventricular  size is normal. There is mildly elevated pulmonary artery systolic  pressure. The estimated right ventricular systolic pressure is 78.2 mmHg.   3. Left atrial size was mildly dilated.   4. Right atrial size was moderately dilated.   5. There is a trivial pericardial effusion posterior to the left  ventricle.   6. The mitral valve is abnormal. Mild to moderate mitral valve  regurgitation.   7. Tricuspid valve regurgitation is moderate.   8. The aortic valve is tricuspid. Aortic valve regurgitation is trivial.  Aortic regurgitation PHT measures 956 msec.   9. The inferior vena cava is normal in size with <50% respiratory  variability, suggesting right atrial pressure of 8 mmHg.   Comparison(s): Echocardiogram done 02/17/20 showed an EF of 40-45%.  Assessment and Plan:  1.  History of ischemic cardiomyopathy with normalization of LVEF by echocardiogram in May, 65 to 70% at that time.  She does have chronic diastolic dysfunction.  Fluid status has improved with modification of diuretics.  Continue Demadex 50 mg daily with potassium supplement.  She is otherwise on Coreg which will be changed to 12.5 mg twice daily, hydralazine, and Imdur.  2.  CKD stage IV, continues to follow with Dr. Theador Hawthorne.  Last creatinine 2.74.  3.  Paroxysmal atrial fibrillation with CHA2DS2-VASc score of 7.  She is in sinus rhythm today.  Continue low-dose amiodarone along with Eliquis.  4.  CAD status post DES to the LAD in January 2021 and DES to the mid RCA in December 2021.  No active angina at this time.  She is not on aspirin given use of Eliquis.  Continue Coreg and Crestor.  Medication Adjustments/Labs and Tests Ordered: Current medicines are reviewed at length with the patient today.  Concerns regarding medicines are outlined above.   Tests Ordered: No orders of the defined types were placed in this  encounter.   Medication Changes: Meds ordered this encounter  Medications   carvedilol (COREG) 12.5 MG tablet    Sig: Take 1 tablet (12.5 mg total) by mouth 2 (two) times daily.    Dispense:  180 tablet    Refill:  1    12/04/2020 dose decrease     Disposition:  Follow up  3 months.  Signed, Satira Sark, MD, [facility]  12/04/2020 4:42 PM    [facility] at [place], [place], [place] [address] Phone: [phone]; Fax: [phone]

## 2020-12-05 ENCOUNTER — Encounter (HOSPITAL_COMMUNITY)
Admission: RE | Admit: 2020-12-05 | Discharge: 2020-12-05 | Disposition: A | Discharge status: Home / Self Care | Payer: Medicare Other | Source: Ambulatory Visit | Attending: Nephrology | Admitting: Nephrology

## 2020-12-05 ENCOUNTER — Other Ambulatory Visit: Payer: Self-pay

## 2020-12-05 DIAGNOSIS — N189 Chronic kidney disease, unspecified: Secondary | ICD-10-CM | POA: Diagnosis not present

## 2020-12-05 DIAGNOSIS — D638 Anemia in other chronic diseases classified elsewhere: Secondary | ICD-10-CM | POA: Diagnosis not present

## 2020-12-05 DIAGNOSIS — I129 Hypertensive chronic kidney disease with stage 1 through stage 4 chronic kidney disease, or unspecified chronic kidney disease: Secondary | ICD-10-CM | POA: Diagnosis not present

## 2020-12-05 DIAGNOSIS — E1122 Type 2 diabetes mellitus with diabetic chronic kidney disease: Secondary | ICD-10-CM | POA: Diagnosis not present

## 2020-12-05 DIAGNOSIS — N184 Chronic kidney disease, stage 4 (severe): Secondary | ICD-10-CM | POA: Insufficient documentation

## 2020-12-05 DIAGNOSIS — D631 Anemia in chronic kidney disease: Secondary | ICD-10-CM | POA: Insufficient documentation

## 2020-12-05 DIAGNOSIS — I5032 Chronic diastolic (congestive) heart failure: Secondary | ICD-10-CM | POA: Diagnosis not present

## 2020-12-05 LAB — POCT HEMOGLOBIN-HEMACUE: Hemoglobin: 10 g/dL — ABNORMAL LOW (ref 12.0–15.0)

## 2020-12-05 MED ORDER — EPOETIN ALFA-EPBX 3000 UNIT/ML IJ SOLN: 3000.0000 [IU] | Freq: Once | INTRAMUSCULAR | Status: DC

## 2020-12-05 NOTE — Addendum Note (Signed)
Addended by: Merlene Laughter on: 12/05/2020 07:54 AM   Modules accepted: Orders

## 2020-12-05 NOTE — Progress Notes (Signed)
BP 184/47. Talked with Thayer Headings at Dr Toya Smothers office. Instructed to tell pt to go to his office. Pt informed and voiced understanding.

## 2020-12-06 DIAGNOSIS — Z20822 Contact with and (suspected) exposure to covid-19: Secondary | ICD-10-CM | POA: Diagnosis not present

## 2020-12-07 ENCOUNTER — Other Ambulatory Visit: Payer: Self-pay | Admitting: *Deleted

## 2020-12-07 NOTE — Patient Outreach (Signed)
[facility]) Care Management  12/07/2020  Crystal [name] [birth year] [id number]  Incoming call from pt to advise she needs to change her appt scheduled for 12/13/20. NP advised her that she will now be transitioned to the care manager that is in her primary care office. Advised that it has been a pleasure working with her over the past year and that NP wishes her continued progress for the rest of the year and a great 2023!  Will advise upstream of need for transition for this pt.   Goals Addressed               This Visit's Progress     Patient Stated     COMPLETED: [facility]) Patient Stated (pt-stated)        Pt will call MD or NP if has increasing sxs HF (wt. Gain, wheezing, SOB, Edema) to overt an ED visit over the next 3 months.  Start date 10/23/20, renewed on 03/07/20., renewed 06/18/20  High Priority Long term goal Expected end date: Follow up 12/07/20  Barriers: Health Behaviors Knowledge  03/07/20 Pt has followed her goal but had to go to the ED and be hospitalized for subsequent MI. Reinforced HF and CAD Action plan to weigh daily, take meds, activity to tolerance, go to appts and report any heart sxs early to MD to prevent complications and subsequent trips to the hospital. 03/14/20 Following HF Action plan. Wt is stable, no signs of HF. Pt acknowledges she knows when to call for advice. She will have video visit with cardiology tomorrow She feels she is making progress, striving to get her independence back. She feels she is less SOB and it is evident in talking with her, she is less SOB when talking. Encouraged her to give all her effort in her therapy sessions to progress and to be safe. 03/21/20 No acute probems, progressing nicely. Encouraged continued efforts, attending appts, following medical recommendations. Celebrated that she is happy with her progress and that she IS getting better. 03/28/20 Pt reports continueous improvements in her phycial recovery. No  HF exacerbating sxs reported. Knows when to call for help. Reinforced Action Plan. 04/04/20 Wt stable between 127-129#, no SOB and minimal edema. General overall health improvement. Praised for her continued self managed and encouraged to continue this life long regimen. 05/03/20 Wt 129#. Has some SOB on exertion but note her Hgb has fallen to 7.0! She is being worked up by hematology, has had a bone scan. She has had a couple of dark stools and has reported this to her GI provider. Dr. Cyndi Bender is trying to get her in the office ASAP. She has moved back to her own home. She is able to get around cautiously using her walker. They kids are checking in on her daily. She carries her cell phone with her at all times to call for help if she needs to. 05/18/20 Has not needed to call for any new significant sxs. Today's wt was 130#. She saw Dr. Domenic Polite this week and he did increase her demadex dose to 40 mg for 3 days the to alternating days of 20 mg and 40 mg. Her niece is assisting her with medication organization for bid dosing. This works very well for her. She will f/u with him in May. Her hgb is up to 8.1, steadily increasing. She has resumed her eliquis and iron supplementation. She has seen her GI specialist. 06/18/20 Today's wt is 120# (10# wt loss) Demadex has been increased  to 40 mg daily continuously now. Reinforced low salt diet, especially when eating out. Ask for low Na and heart healthy choices. Dr. Domenic Polite took her off of Plavix only on Eliquis now. 07/19/20 Wt is 122.3, no SOB, does have some edema but this resolves during the night. She continues to take 40 mg of toresemide daily. Her potassium was increased to 60 meq in May when her potassium level was 3.7 and has not been rechecked. Will send Dr. Theador Hawthorne a note asking if he would like to get that rechecked before he sees her again 08/15/20. Reinforced to call if any problems arise. 08/16/20 Pt will need to resume her home self management of her HF. She denies  SOB, edema. 08/20/20 Pt reports she has not started weighing again. She will ask her nephew to get the scale out again for her so she can resume. She does report edema and she saw her nephrologist who advised she can take torsemide 60 mg now. Wt 134# in MD office. He advised that if she does not lose 5# this week to call him. Reminded her to tell her nephew she does not need any added salt to her food. 09/03/20 Pt wt is 122. No SOB, only ususal foot and ankle edema.Continue sodium restriction, medication regimen and HF ACTION PLAN.09/18/20 Wt is down to 119 since diuretic has been increased to 40 mg am and at lunch. Still has edema which may be contributed to amlodipine. Will communicate with cardiology to consider stopping, only on 2.5 mg. Praised pt for following her HF Action PLan consistently. Goal met.      Other     COMPLETED: Comorbidities Identified and Managed and documented with pt self management reported each month over the next 3 months.        Pt goal started: 04/04/20 Long term goal High priority Follow up 12/13/20 Expected completion; 07/26/20, Renewed for another 3 months 10/26/20, ongoing goal while pt is active. Barriers: previous barriers have been resolved (11/15/20) Health Behaviors Knowledge     Notes: 04/04/20 Other comorbidities to monitor: DM, CKD, CAD. Chart review revealed Diabetes: Lipids were normal in January! HgbA1C was 6.0 in December! Does not check her glucose at home very often as this is well controlled. Reports she had a foot exam last fall, will have her eye exam: tomorrow.  CKD: labs ordered by nephrology, will get labs tomorrow and pick up jug for 24 hour urine collection. Next appt is last week of Marchl for follow up and discussion regarding lab results. No new CAD sxs and follow up with cardiology scheduled for May. 05/18/20 Labs recently done. CBC improving. Labs ordered by hematologist are mostly out of range. Dr. Leron Croak is following closely. Pt is getting iron  infusions. Reviewed safety measures since pt is back at her home. She is getting around better all the time and she is starting to drive again and feels confident. She is extremely cautious when ambulating and avoids taking risks like stepping up on a curb. She will go up the curb cutout. Reinforced safety as #1 and notifying MD of any problems early. 06/18/20 Pt is feeling the best she has felt, HF more stable, CKD also stable back to stage III. She is able to do her housework slowly as she tires easily. She says she cannot bend over and clean her bath tub. Suggested she use a mop and that way she doesn't need to bend over. Her thyroid levels were recently checked and that is normal  range. was 9.9.07/19/20 Saw GI and she is stable, no dx scheduled. 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 Advised she should hold pressure over areas she knows she has bumped to minimize the superficial bruising. Suggested we request an application for pharmacy assistance for the Eliquis and she is in agreement. Will refer. Encouraged to call for any issues for prompt intervention and to avoid complications. She did have an iron infusion yesterday as her hgb 08/16/20 New problem illeus, requiring exploratory lab and discovery of gangrenous appendix. Ileus resolved. Home now with nepthew. Able to eat soft foods, having formed stools. Will have surgical follow up. 08/20/20 CKD-had f/u with nephrologist 7/22, he increased torsemide to address wt gain, low na, HTN. Pt is not back on track with weighing daily since she came home from the hospital last week. Reinforced that is #1 self care action to resume and to advise MD or NP if wt increases 3-5#. Hgb low and she will resume her Epogen injections. Will see primary care via televisit this week, cardiology 09/08/20. GI follow up on 01/08/21. 09/03/20 Pt had OV with primary last week, Hgb low and had to have 2 units of blood. She will have her  epogen injection tomorrow. Praised for making her appt. Encouraged continued self care. 09/18/20 Improved general well-being with having blood, increasing diuretic and reducing her beta blocker dose. Praised for how she is coping and managing her multiple health issues! 10/18/20 Chronic illnesses at this time seem to be stable. What is of concern at this time is her significant wt loss. She has been advised to drink one boost a day and continue to have 3 meals a day. New goal started today regarding wt maintenance. 11/15/20 Stable HF, CKD, Anemia. Following up with nepthrology next week and  Cardiology 12/04/20. Has hgb checked every other week for anemia. Very good self management. Pt praised for her coping skills and following her action plans.      Pt weight will be maintained > 105# over the next 3 month.   On track     Start date: 10/18/20 Expected end date: 01/25/21 High Priority Long term Barriers: Other: fair appetite  Notes: 12/07/20 Pt notified of transition to her primary care office for continued care management servcies. This goal should be continued for careful wt varience. 11/15/20 Pt wt is up to 114.3 with greater vigilance on increased nutritional needs and drinking supplement. Also did PHQ2 today which was negative for depression at this time. Pt reports has plenty of food but healthy foods are more expensive. She also reports her wardrobe is all too big.  10/18/20 Pt reports additional weight loss of 10# per her home scale to 109# over last month.  The scale battery has been changed. Her MD office visits are registering higher including Dr. Toya Smothers office on 9/14 was 116# which represents an 18# loss (134#) since she had GI surgery in July. Encouraged 3 meals, snacks and her Boost daily.           Crystal [name]. Crystal Neither, MSN, [facility] Gerontological Nurse Practitioner [facility] Care Management [phone]

## 2020-12-13 ENCOUNTER — Other Ambulatory Visit: Payer: Medicare Other | Admitting: *Deleted

## 2020-12-17 ENCOUNTER — Other Ambulatory Visit (HOSPITAL_COMMUNITY)
Admission: RE | Admit: 2020-12-17 | Discharge: 2020-12-17 | Disposition: A | Discharge status: Home / Self Care | Payer: Medicare Other | Source: Ambulatory Visit | Attending: Nephrology | Admitting: Nephrology

## 2020-12-17 ENCOUNTER — Other Ambulatory Visit: Payer: Self-pay

## 2020-12-17 DIAGNOSIS — R809 Proteinuria, unspecified: Secondary | ICD-10-CM | POA: Insufficient documentation

## 2020-12-17 DIAGNOSIS — N189 Chronic kidney disease, unspecified: Secondary | ICD-10-CM | POA: Diagnosis not present

## 2020-12-17 DIAGNOSIS — E876 Hypokalemia: Secondary | ICD-10-CM | POA: Diagnosis not present

## 2020-12-17 DIAGNOSIS — R768 Other specified abnormal immunological findings in serum: Secondary | ICD-10-CM | POA: Diagnosis not present

## 2020-12-17 DIAGNOSIS — D472 Monoclonal gammopathy: Secondary | ICD-10-CM | POA: Insufficient documentation

## 2020-12-17 DIAGNOSIS — I129 Hypertensive chronic kidney disease with stage 1 through stage 4 chronic kidney disease, or unspecified chronic kidney disease: Secondary | ICD-10-CM | POA: Diagnosis not present

## 2020-12-17 DIAGNOSIS — E1129 Type 2 diabetes mellitus with other diabetic kidney complication: Secondary | ICD-10-CM | POA: Insufficient documentation

## 2020-12-17 DIAGNOSIS — E1122 Type 2 diabetes mellitus with diabetic chronic kidney disease: Secondary | ICD-10-CM | POA: Diagnosis not present

## 2020-12-17 DIAGNOSIS — I5032 Chronic diastolic (congestive) heart failure: Secondary | ICD-10-CM | POA: Diagnosis not present

## 2020-12-17 DIAGNOSIS — D638 Anemia in other chronic diseases classified elsewhere: Secondary | ICD-10-CM | POA: Diagnosis not present

## 2020-12-17 LAB — RENAL FUNCTION PANEL
Albumin: 4 g/dL (ref 3.5–5.0)
Anion gap: 10 (ref 5–15)
BUN: 68 mg/dL — ABNORMAL HIGH (ref 8–23)
CO2: 27 mmol/L (ref 22–32)
Calcium: 9.2 mg/dL (ref 8.9–10.3)
Chloride: 98 mmol/L (ref 98–111)
Creatinine, Ser: 2.51 mg/dL — ABNORMAL HIGH (ref 0.44–1.00)
GFR, Estimated: 19 mL/min — ABNORMAL LOW (ref >60)
Glucose, Bld: 118 mg/dL — ABNORMAL HIGH (ref 70–99)
Phosphorus: 4 mg/dL (ref 2.5–4.6)
Potassium: 3.4 mmol/L — ABNORMAL LOW (ref 3.5–5.1)
Sodium: 135 mmol/L (ref 135–145)

## 2020-12-17 LAB — FERRITIN: Ferritin: 300 ng/mL (ref 11–307)

## 2020-12-17 LAB — CBC
HCT: 30.9 % — ABNORMAL LOW (ref 36.0–46.0)
Hemoglobin: 10.3 g/dL — ABNORMAL LOW (ref 12.0–15.0)
MCH: 29.3 pg (ref 26.0–34.0)
MCHC: 33.3 g/dL (ref 30.0–36.0)
MCV: 87.8 fL (ref 80.0–100.0)
Platelets: 233 10*3/uL (ref 150–400)
RBC: 3.52 MIL/uL — ABNORMAL LOW (ref 3.87–5.11)
RDW: 15.4 % (ref 11.5–15.5)
WBC: 6.7 10*3/uL (ref 4.0–10.5)
nRBC: 0 % (ref 0.0–0.2)

## 2020-12-17 LAB — VITAMIN D 25 HYDROXY (VIT D DEFICIENCY, FRACTURES): Vit D, 25-Hydroxy: 68.78 ng/mL (ref 30–100)

## 2020-12-17 LAB — IRON AND TIBC
Iron: 74 ug/dL (ref 28–170)
Saturation Ratios: 22 % (ref 10.4–31.8)
TIBC: 331 ug/dL (ref 250–450)
UIBC: 257 ug/dL

## 2020-12-17 LAB — PROTEIN / CREATININE RATIO, URINE
Creatinine, Urine: 25.48 mg/dL
Protein Creatinine Ratio: 0.31 mg/mg{Cre} — ABNORMAL HIGH (ref 0.00–0.15)
Total Protein, Urine: 8 mg/dL

## 2020-12-18 DIAGNOSIS — D638 Anemia in other chronic diseases classified elsewhere: Secondary | ICD-10-CM | POA: Diagnosis not present

## 2020-12-18 DIAGNOSIS — I5032 Chronic diastolic (congestive) heart failure: Secondary | ICD-10-CM | POA: Diagnosis not present

## 2020-12-18 DIAGNOSIS — E876 Hypokalemia: Secondary | ICD-10-CM | POA: Diagnosis not present

## 2020-12-18 DIAGNOSIS — E1122 Type 2 diabetes mellitus with diabetic chronic kidney disease: Secondary | ICD-10-CM | POA: Diagnosis not present

## 2020-12-18 DIAGNOSIS — N189 Chronic kidney disease, unspecified: Secondary | ICD-10-CM | POA: Diagnosis not present

## 2020-12-18 DIAGNOSIS — I129 Hypertensive chronic kidney disease with stage 1 through stage 4 chronic kidney disease, or unspecified chronic kidney disease: Secondary | ICD-10-CM | POA: Diagnosis not present

## 2020-12-18 DIAGNOSIS — E1129 Type 2 diabetes mellitus with other diabetic kidney complication: Secondary | ICD-10-CM | POA: Diagnosis not present

## 2020-12-18 DIAGNOSIS — R809 Proteinuria, unspecified: Secondary | ICD-10-CM | POA: Diagnosis not present

## 2020-12-18 LAB — PTH, INTACT AND CALCIUM
Calcium, Total (PTH): 9.3 mg/dL (ref 8.7–10.3)
PTH: 48 pg/mL (ref 15–65)

## 2020-12-19 ENCOUNTER — Encounter (HOSPITAL_COMMUNITY): Payer: Medicare Other

## 2021-01-01 ENCOUNTER — Other Ambulatory Visit: Payer: Self-pay | Admitting: *Deleted

## 2021-01-02 ENCOUNTER — Other Ambulatory Visit: Payer: Self-pay

## 2021-01-02 ENCOUNTER — Encounter (HOSPITAL_COMMUNITY)
Admission: RE | Admit: 2021-01-02 | Discharge: 2021-01-02 | Disposition: A | Discharge status: Home / Self Care | Payer: Medicare Other | Source: Ambulatory Visit | Attending: Nephrology | Admitting: Nephrology

## 2021-01-02 DIAGNOSIS — D631 Anemia in chronic kidney disease: Secondary | ICD-10-CM | POA: Diagnosis not present

## 2021-01-02 DIAGNOSIS — N184 Chronic kidney disease, stage 4 (severe): Secondary | ICD-10-CM | POA: Insufficient documentation

## 2021-01-02 LAB — RENAL FUNCTION PANEL
Albumin: 3.8 g/dL (ref 3.5–5.0)
Anion gap: 15 (ref 5–15)
BUN: 63 mg/dL — ABNORMAL HIGH (ref 8–23)
CO2: 19 mmol/L — ABNORMAL LOW (ref 22–32)
Calcium: 9.2 mg/dL (ref 8.9–10.3)
Chloride: 103 mmol/L (ref 98–111)
Creatinine, Ser: 2.63 mg/dL — ABNORMAL HIGH (ref 0.44–1.00)
GFR, Estimated: 18 mL/min — ABNORMAL LOW (ref >60)
Glucose, Bld: 118 mg/dL — ABNORMAL HIGH (ref 70–99)
Phosphorus: 3.6 mg/dL (ref 2.5–4.6)
Potassium: 3.3 mmol/L — ABNORMAL LOW (ref 3.5–5.1)
Sodium: 137 mmol/L (ref 135–145)

## 2021-01-02 LAB — POCT HEMOGLOBIN-HEMACUE: Hemoglobin: 9.6 g/dL — ABNORMAL LOW (ref 12.0–15.0)

## 2021-01-02 MED ORDER — EPOETIN ALFA-EPBX 3000 UNIT/ML IJ SOLN: 3000.0000 [IU] | Freq: Once | INTRAMUSCULAR | Status: AC

## 2021-01-02 MED ORDER — EPOETIN ALFA-EPBX 3000 UNIT/ML IJ SOLN
INTRAMUSCULAR | Status: AC
  Administered 2021-01-02: 3000 [IU] via SUBCUTANEOUS
  Filled 2021-01-02: qty 1

## 2021-01-04 NOTE — Patient Outreach (Signed)
[facility]) Care Management Geriatric Nurse Practitioner Note   01/04/2021 Name:  Crystal [name] MRN:  [id number] DOB:  1934-11-20  Summary: Crystal [name] is home, safely and her health status is stable.  Recommendations/Changes made from today's visit: Continue self management of HF. Call if any problems arise promptly. NP to continue monitoring until advised UPSTREAM care manager is in place with primary care office.  Subjective: Crystal [name] is an 86 y.o. year old female who is a primary patient of Vyas, Dhruv B, MD. The care management team was consulted for assistance with care management and/or care coordination needs.    Geriatric Nurse Practitioner completed Home Visit today.   Objective: BP (!) 160/50 (BP Location: Right Arm, Patient Position: Sitting, Cuff Size: Normal)   Pulse (!) 50   Resp 16   SpO2 98%    Medications Reviewed Today     Reviewed by Deloria Lair, NP (Nurse Practitioner) on 01/01/21 at 1712  Med List Status: <None>   Medication Order Taking? Sig Documenting Provider Last Dose Status Informant  acetaminophen (TYLENOL) 500 MG tablet [id number]  Take 500 mg by mouth every 6 (six) hours as needed for headache (pain). [provider]  Active Self  amiodarone (PACERONE) 200 MG tablet [id number]  TAKE 2 TABLETS TWICE A DAY UNTIL 2/13 THEN REDUCE TO 1 TABLET DAILY. Satira Sark, MD  Active   carvedilol (COREG) 12.5 MG tablet [id number]  Take 1 tablet (12.5 mg total) by mouth 2 (two) times daily. Satira Sark, MD  Active   cholecalciferol (VITAMIN D3) 25 MCG (1000 UT) tablet [id number]  Take 1,000 Units by mouth daily with breakfast. [provider]  Active Self  ELIQUIS 2.5 MG TABS tablet [id number]  TAKE (1) TABLET TWICE DAILY. Satira Sark, MD  Active Self  ferrous sulfate 325 (65 FE) MG tablet [id number]  Take 325 mg by mouth daily before lunch. [provider]  Active Self           Med Note  [name], Kayleen Memos   Fri May 18, 2020 10:19 AM) HAS RESTARTED ON 05/18/20  hydrALAZINE (APRESOLINE) 100 MG tablet [id number]  Take 100 mg by mouth in the morning, at noon, and at bedtime. [provider]  Active   isosorbide mononitrate (IMDUR) 120 MG 24 hr tablet [id number] Yes TAKE 1 TABLET BY MOUTH AT BEDTIME. Satira Sark, MD Taking Active Self           Med Note [name], Claron Rosencrans   Tue Jan 01, 2021  5:12 PM) Pt reports night sweats so severe she has to change her gown and go to another place to sleep. She feel like it may be this med and asks to take it in the am which PCP approved for a trial.  Multiple Vitamin (MULTIVITAMIN WITH MINERALS) TABS tablet [id number]  Take 1 tablet by mouth daily. Centrum Silver for Women [provider]  Active Self  multivitamin-lutein (OCUVITE-LUTEIN) CAPS capsule [id number]  Take 1 capsule by mouth daily before lunch.  [provider]  Active Self  nitroGLYCERIN (NITROSTAT) 0.4 MG SL tablet [id number]  Place 1 tablet (0.4 mg total) under the tongue every 5 (five) minutes x 3 doses as needed for chest pain. Cheryln Manly, NP  Active Self           Med Note Orma Render   Tue Dec 04, 2020  4:15 PM) Patient has if needed  pantoprazole (PROTONIX) 40 MG tablet [id number]  Take 1 tablet (40 mg total) by mouth daily. Take 30 minutes before breakfast Annitta Needs, NP  Active Self  potassium chloride (KLOR-CON) 10 MEQ tablet [id number]  Take 10 mEq by mouth 4 (four) times daily. [provider]  Active   rosuvastatin (CRESTOR) 5 MG tablet [id number]  Take 0.5 tablets (2.5 mg total) by mouth every other day. Cheryln Manly, NP  Active Self  torsemide (DEMADEX) 100 MG tablet [id number]  Take 100 mg by mouth daily. Patient takes a 1/2 tablet [provider]  Active             Care Plan  Review of patient past medical history, allergies, medications, health status, including review of consultants reports, laboratory  and other test data, was performed as part of comprehensive evaluation for care management services.   Care Plan : RN Care Manager Plan of Care  Updates made by Deloria Lair, NP since 01/04/2021 12:00 AM     Problem: Ongoing self management of HF,   Priority: High  Onset Date: 01/04/2021     Long-Range Goal: I will continue to weigh daily and report increased wt of 3-5#, SOB, increased edema promptly to MD to avoid complications over the next 3 months.   Start Date: 01/04/2021  Expected End Date: 04/26/2021  Priority: High  Note:   Current Barriers:  No barriers at present.  RNCM Clinical Goal(s):  Patient will demonstrate ongoing health management independence as evidenced by Pt report of condition on each telephone encounter.        not experience hospital admission as evidenced by review of EMR. Hospital Admissions in last 6 months = 1  through collaboration with RN Care manager, provider, and care team.   Interventions: Inter-disciplinary care team collaboration (see longitudinal plan of care) Evaluation of current treatment plan related to  self management and patient's adherence to plan as established by provider  CHF  Patient Goals/Self-Care Activities: Take medications as prescribed   Attend all scheduled provider appointments call office if I gain more than 2 pounds in one day or 5 pounds in one week track weight in diary watch for swelling in feet, ankles and legs every day weigh myself daily follow rescue plan if symptoms flare-up        Plan: Telephone follow up appointment with care management team member scheduled for:  January  Martia Dalby C. [name], MSN, [facility] Gerontological Nurse Practitioner [facility] Care Management [phone]

## 2021-01-07 ENCOUNTER — Other Ambulatory Visit: Payer: Self-pay | Admitting: Cardiology

## 2021-01-07 NOTE — Progress Notes (Signed)
Referring Provider: Glenda Chroman, MD Primary Care Physician:  Glenda Chroman, MD Primary GI: Dr. Gala Romney   Chief Complaint  Patient presents with   Constipation    Sometimes. Takes colace prn   Anemia    Said sometimes stool is dark. Takes Eliquis and iron   Gas    HPI:   Crystal [name] is an 86 y.o. female presenting today with a history of multifactorial normocytic anemia in setting of CKD, IDA component, dysphagia s/p EGD in Sept 2021 with non-obstructing Schatzki ring at GE junction, s/p dilation, erosive gastropathy with stigmata of recent bleeding, normal duodenum. Negative H.pylori. Colonoscopy in 2019.    Hematology reached out to [place] regarding drop in Hgb from 9 in Feb 2022 to 7 in April 2022.  Had been on Eliquis and Plavix (recent stent placement 12/21). She was also diagnosed with MGUS and concern for possible multiple myeloma, pending further evaluation. She declined capsule and EGD/capulse placement. Declining updated colonoscopy. She is back on Eliquis but no Plavix. Continues PPI daily. Takes iron once daily. Retacrit per Nephrology as needed. Stool is dark on iron. Most recent Hgb 9.6, similar to prior. Ferritin 300.   Constipation: taking stool softener just as needed. Has lots of gas to sill take gas-x. Constipation better than it has been in the past. BM about every day.    July 2022 acute appendicitis. Feels like it's harder to get her strength back this time.   Past Medical History:  Diagnosis Date   Anemia    Bell's palsy    Breast cancer ([place]) 1998   Right mastectomy   CAD (coronary artery disease)    a. s/p STEMI in 01/2019 with DES to LAD b. s/p NSTEMI in 12/2019 with DES to RCA c. cath in 01/2020 showing patent stents   CHF (congestive heart failure) ([place])    a. EF 30-35% in 01/2019 b. 40-45% in 01/2020 c. EF at 65-70% by echo in 05/2020   Chronic kidney disease    Essential hypertension    GERD (gastroesophageal reflux disease)    Gout     History of skin cancer    Squamous cell, left shoulder   Mixed hyperlipidemia    Osteopenia    Paroxysmal atrial fibrillation (HCC)    Thyroid nodule    Type 2 diabetes mellitus ([place])     Past Surgical History:  Procedure Laterality Date   BIOPSY  10/13/2019   Procedure: BIOPSY;  Surgeon: Daneil Dolin, MD;  [facility];  Service: Endoscopy;;   CATARACT EXTRACTION  2016   COLONOSCOPY  2019   Dr Anthony Sar   CORONARY ANGIOGRAPHY N/A 01/16/2020   Procedure: CORONARY ANGIOGRAPHY;  Surgeon: Jettie Booze, MD;  Location: [place] [facility];  Service: Cardiovascular;  Laterality: N/A;   CORONARY STENT INTERVENTION N/A 01/16/2020   Procedure: CORONARY STENT INTERVENTION;  Surgeon: Jettie Booze, MD;  Location: [place] [facility];  Service: Cardiovascular;  Laterality: N/A;   CORONARY/GRAFT ACUTE MI REVASCULARIZATION N/A 01/30/2019   Procedure: Coronary/Graft Acute MI Revascularization;  Surgeon: Burnell Blanks, MD;  Location: [place] [facility];  Service: Cardiovascular;  Laterality: N/A;   ESOPHAGOGASTRODUODENOSCOPY (EGD) WITH PROPOFOL N/A 10/13/2019   Non-obstructing Schatzki ring at GE junction, s/p dilation, erosive gastropathy with stigmata of recent bleeding, normal duodenum. Negative H.pylori.    HYSTEROSCOPY     INTRAVASCULAR ULTRASOUND/IVUS N/A 01/16/2020   Procedure: Intravascular Ultrasound/IVUS;  Surgeon: Irish Lack,  Charlann Lange, MD;  Location: [place] [facility];  Service: Cardiovascular;  Laterality: N/A;   LAPAROSCOPIC APPENDECTOMY N/A 08/03/2020   Procedure: APPENDECTOMY LAPAROSCOPIC;  Surgeon: Ronny Bacon, MD;  Location: [facility];  Service: General;  Laterality: N/A;   LEFT HEART CATH AND CORONARY ANGIOGRAPHY N/A 01/30/2019   Procedure: LEFT HEART CATH AND CORONARY ANGIOGRAPHY;  Surgeon: Burnell Blanks, MD;  Location: [place] [facility];  Service: Cardiovascular;  Laterality: N/A;   LEFT HEART CATH AND CORONARY ANGIOGRAPHY N/A 01/16/2020    Procedure: LEFT HEART CATH AND CORONARY ANGIOGRAPHY;  Surgeon: Jettie Booze, MD;  Location: [place] [facility];  Service: Cardiovascular;  Laterality: N/A;   MALONEY DILATION N/A 10/13/2019   Procedure: Venia Minks DILATION;  Surgeon: Daneil Dolin, MD;  [facility];  Service: Endoscopy;  Laterality: N/A;   Right mastectomy  1998   [facility]   RIGHT/LEFT HEART CATH AND CORONARY ANGIOGRAPHY N/A 02/13/2020   Procedure: RIGHT/LEFT HEART CATH AND CORONARY ANGIOGRAPHY;  Surgeon: Martinique, Peter M, MD;  Location: [place] [facility];  Service: Cardiovascular;  Laterality: N/A;    Current Outpatient Medications  Medication Sig Dispense Refill   acetaminophen (TYLENOL) 500 MG tablet Take 500 mg by mouth every 6 (six) hours as needed for headache (pain).     amiodarone (PACERONE) 200 MG tablet TAKE 2 TABLETS TWICE A DAY UNTIL 2/13 THEN REDUCE TO 1 TABLET DAILY. 60 tablet 6   carvedilol (COREG) 12.5 MG tablet Take 1 tablet (12.5 mg total) by mouth 2 (two) times daily. 180 tablet 1   cholecalciferol (VITAMIN D3) 25 MCG (1000 UT) tablet Take 1,000 Units by mouth daily with breakfast.     docusate sodium (COLACE) 100 MG capsule Take 100 mg by mouth daily as needed for mild constipation.     ELIQUIS 2.5 MG TABS tablet TAKE (1) TABLET TWICE DAILY. 60 tablet 5   ferrous sulfate 325 (65 FE) MG tablet Take 325 mg by mouth daily before lunch.     hydrALAZINE (APRESOLINE) 100 MG tablet Take 100 mg by mouth in the morning, at noon, and at bedtime.     isosorbide mononitrate (IMDUR) 120 MG 24 hr tablet TAKE 1 TABLET BY MOUTH AT BEDTIME. 30 tablet 6   Multiple Vitamin (MULTIVITAMIN WITH MINERALS) TABS tablet Take 1 tablet by mouth daily. Centrum Silver for Women     multivitamin-lutein (OCUVITE-LUTEIN) CAPS capsule Take 1 capsule by mouth daily before lunch.      nitroGLYCERIN (NITROSTAT) 0.4 MG SL tablet PLACE 1 TAB UNDER TONGUE EVERY 3 MINUTES AS DIRECTED UP TO 3 TIMES AS NEEDED FOR CHEST PAIN. 25  tablet 0   pantoprazole (PROTONIX) 40 MG tablet Take 1 tablet (40 mg total) by mouth daily. Take 30 minutes before breakfast 90 tablet 3   potassium chloride (KLOR-CON) 10 MEQ tablet Take 10 mEq by mouth 2 (two) times daily.     rosuvastatin (CRESTOR) 5 MG tablet Take 0.5 tablets (2.5 mg total) by mouth every other day. 30 tablet 0   Simethicone (GAS-X PO) Take by mouth as needed.     torsemide (DEMADEX) 100 MG tablet Take 50 mg by mouth daily.     No current facility-administered medications for this visit.    Allergies as of 01/08/2021 - Review Complete 01/08/2021  Allergen Reaction Noted   Bactrim [sulfamethoxazole-trimethoprim] Nausea And Vomiting 06/02/2017   Sulfa antibiotics Nausea And Vomiting 06/02/2017   Amlodipine Other (See Comments) 06/02/2017   Clonidine derivatives Other (See  Comments) 06/02/2017   Evista [raloxifene hcl] Other (See Comments) 06/02/2017   Fosamax [alendronate sodium] Other (See Comments) 06/02/2017   Glipizide Other (See Comments) 06/02/2017   Lipitor [atorvastatin calcium] Other (See Comments) 06/02/2017   Losartan Other (See Comments) 06/02/2017   Pravastatin Other (See Comments) 06/02/2017   Azithromycin Rash and Other (See Comments) 06/02/2017    Family History  Problem Relation Age of Onset   Stroke Mother    Heart attack Father    Aneurysm Sister    Heart attack Maternal Uncle    Heart disease Maternal Uncle    Heart attack Paternal Aunt    Heart disease Paternal Aunt    Heart attack Paternal Grandfather    Heart disease Paternal Grandfather    Heart attack Paternal Aunt    Heart disease Paternal Aunt    Heart attack Maternal Uncle    Heart disease Maternal Uncle    Heart attack Maternal Uncle    Heart disease Maternal Uncle    Heart attack Sister    Heart disease Sister    Cancer Sister    Colon cancer Neg Hx    Colon polyps Neg Hx     Social History   Socioeconomic History   Marital status: Divorced    Spouse name: Not on  file   Number of children: Not on file   Years of education: Not on file   Highest education level: Not on file  Occupational History   Not on file  Tobacco Use   Smoking status: Never   Smokeless tobacco: Never  Vaping Use   Vaping Use: Never used  Substance and Sexual Activity   Alcohol use: Never   Drug use: Never   Sexual activity: Not on file  Other Topics Concern   Not on file  Social History Narrative   Not on file   Social Determinants of Health   Financial Resource Strain: Low Risk    Difficulty of Paying Living Expenses: Not hard at all  Food Insecurity: No Food Insecurity   Worried About Charity fundraiser in the Last Year: Never true   [place] in the Last Year: Never true  Transportation Needs: No Transportation Needs   Lack of Transportation (Medical): No   Lack of Transportation (Non-Medical): No  Physical Activity: Insufficiently Active   Days of Exercise per Week: 1 day   Minutes of Exercise per Session: 20 min  Stress: No Stress Concern Present   Feeling of Stress : Not at all  Social Connections: Moderately Isolated   Frequency of Communication with Friends and Family: More than three times a week   Frequency of Social Gatherings with Friends and Family: More than three times a week   Attends Religious Services: 1 to 4 times per year   Active Member of [employer] or Organizations: No   Attends Archivist Meetings: Never   Marital Status: Divorced    Review of Systems: Gen: Denies fever, chills, anorexia. Denies fatigue, weakness, weight loss.  CV: Denies chest pain, palpitations, syncope, peripheral edema, and claudication. Resp: Denies dyspnea at rest, cough, wheezing, coughing up blood, and pleurisy. GI: see HPI Derm: Denies rash, itching, dry skin Psych: Denies depression, anxiety, memory loss, confusion. No homicidal or suicidal ideation.  Heme: Denies bruising, bleeding, and enlarged lymph nodes.  Physical Exam: BP (!) 180/50    Pulse (!) 52   Temp (!) 96.9 F (36.1 C) (Temporal)   Ht 5' 5"  (1.651 m)  Wt 116 lb 9.6 oz (52.9 kg)   BMI 19.40 kg/m  General:   Alert and oriented. No distress noted. Pleasant and cooperative.  Head:  Normocephalic and atraumatic. Eyes:  Conjuctiva clear without scleral icterus. Mouth:  mask in place Abdomen:  +BS, soft, non-tender and non-distended. No rebound or guarding. No HSM or masses noted. Msk:  Symmetrical without gross deformities. Normal posture. Extremities:  Without edema. Neurologic:  Alert and  oriented x4 Psych:  Alert and cooperative. Normal mood and affect.   Lab Results  Component Value Date   IRON 74 12/17/2020   TIBC 331 12/17/2020   FERRITIN 300 12/17/2020   Lab Results  Component Value Date   WBC 6.7 12/17/2020   HGB 9.6 (L) 01/02/2021   HCT 30.9 (L) 12/17/2020   MCV 87.8 12/17/2020   PLT 233 12/17/2020     ASSESSMENT: MARYLON VERNO is an 86 y.o. female presenting today with a history of multifactorial normocytic anemia in setting of CKD, IDA component, dysphagia s/p EGD in Sept 2021 with non-obstructing Schatzki ring at GE junction, s/p dilation, erosive gastropathy with stigmata of recent bleeding, normal duodenum. Negative H.pylori. Colonoscopy in 2019.    Anemia: Hgb stable, ferritin 300. She is declining additional evaluation (capsule). Declining updated colonoscopy, as last was in 2019. Encouragingly, she is no longer on Plavix and only Eliquis. I suspect small bowel source for occult blood loss. However, she is stable currently and followed closely by Nephrology with Retacrit as needed. Stool is dark on iron; otherwise, no overt GI bleeding.   If she is willing, I would recommend capsule for further evaluation. She desires to hold off on this currently. We will continue supportive measures for now.   Constipation: improved. Stool softener prn.     PLAN:  Continue iron daily Continue PPI daily Serial labs by Nephrology Return in  6 months or sooner if needed  Annitta Needs, PhD, ANP-BC [facility] Gastroenter[facility]

## 2021-01-07 NOTE — Telephone Encounter (Signed)
This is a [place] pt. Dr. Domenic Polite

## 2021-01-08 ENCOUNTER — Encounter: Payer: Self-pay | Admitting: Gastroenterology

## 2021-01-08 ENCOUNTER — Other Ambulatory Visit: Payer: Self-pay

## 2021-01-08 ENCOUNTER — Ambulatory Visit (INDEPENDENT_AMBULATORY_CARE_PROVIDER_SITE_OTHER): Payer: Medicare Other | Admitting: Gastroenterology

## 2021-01-08 VITALS — BP 180/50 | HR 52 | Temp 96.9°F | Ht 65.0 in | Wt 116.6 lb

## 2021-01-08 DIAGNOSIS — K5900 Constipation, unspecified: Secondary | ICD-10-CM | POA: Diagnosis not present

## 2021-01-08 DIAGNOSIS — I255 Ischemic cardiomyopathy: Secondary | ICD-10-CM

## 2021-01-08 DIAGNOSIS — D649 Anemia, unspecified: Secondary | ICD-10-CM | POA: Diagnosis not present

## 2021-01-08 NOTE — Patient Instructions (Signed)
I am glad you are doing well!  We will see you in 6 months  Please call with any concerns!  Have a wonderful Christmas!  I enjoyed seeing you again today! As you know, I value our relationship and want to provide genuine, compassionate, and quality care. I welcome your feedback. If you receive a survey regarding your visit,  I greatly appreciate you taking time to fill this out. See you next time!  Annitta Needs, PhD, ANP-BC [facility] Gastroenterology

## 2021-01-16 ENCOUNTER — Encounter (HOSPITAL_COMMUNITY): Payer: Medicare Other

## 2021-01-21 ENCOUNTER — Other Ambulatory Visit: Payer: Self-pay | Admitting: Gastroenterology

## 2021-01-29 ENCOUNTER — Other Ambulatory Visit (HOSPITAL_COMMUNITY)
Admission: RE | Admit: 2021-01-29 | Discharge: 2021-01-29 | Disposition: A | Discharge status: Home / Self Care | Payer: Medicare Other | Source: Ambulatory Visit | Attending: Nephrology | Admitting: Nephrology

## 2021-01-29 ENCOUNTER — Other Ambulatory Visit: Payer: Self-pay

## 2021-01-29 DIAGNOSIS — N189 Chronic kidney disease, unspecified: Secondary | ICD-10-CM | POA: Diagnosis not present

## 2021-01-29 DIAGNOSIS — E1129 Type 2 diabetes mellitus with other diabetic kidney complication: Secondary | ICD-10-CM | POA: Diagnosis not present

## 2021-01-29 DIAGNOSIS — I129 Hypertensive chronic kidney disease with stage 1 through stage 4 chronic kidney disease, or unspecified chronic kidney disease: Secondary | ICD-10-CM | POA: Diagnosis not present

## 2021-01-29 DIAGNOSIS — R809 Proteinuria, unspecified: Secondary | ICD-10-CM | POA: Diagnosis not present

## 2021-01-29 DIAGNOSIS — D638 Anemia in other chronic diseases classified elsewhere: Secondary | ICD-10-CM | POA: Diagnosis not present

## 2021-01-29 DIAGNOSIS — E876 Hypokalemia: Secondary | ICD-10-CM | POA: Insufficient documentation

## 2021-01-29 DIAGNOSIS — I5032 Chronic diastolic (congestive) heart failure: Secondary | ICD-10-CM | POA: Diagnosis not present

## 2021-01-29 DIAGNOSIS — E1122 Type 2 diabetes mellitus with diabetic chronic kidney disease: Secondary | ICD-10-CM | POA: Diagnosis not present

## 2021-01-29 LAB — RENAL FUNCTION PANEL
Albumin: 3.8 g/dL (ref 3.5–5.0)
Anion gap: 11 (ref 5–15)
BUN: 58 mg/dL — ABNORMAL HIGH (ref 8–23)
CO2: 29 mmol/L (ref 22–32)
Calcium: 9 mg/dL (ref 8.9–10.3)
Chloride: 98 mmol/L (ref 98–111)
Creatinine, Ser: 2.62 mg/dL — ABNORMAL HIGH (ref 0.44–1.00)
GFR, Estimated: 17 mL/min — ABNORMAL LOW (ref >60)
Glucose, Bld: 90 mg/dL (ref 70–99)
Phosphorus: 3.8 mg/dL (ref 2.5–4.6)
Potassium: 3.2 mmol/L — ABNORMAL LOW (ref 3.5–5.1)
Sodium: 138 mmol/L (ref 135–145)

## 2021-01-29 LAB — PROTEIN / CREATININE RATIO, URINE
Creatinine, Urine: 25.61 mg/dL
Protein Creatinine Ratio: 0.43 mg/mg{Cre} — ABNORMAL HIGH (ref 0.00–0.15)
Total Protein, Urine: 11 mg/dL

## 2021-01-29 LAB — CBC
HCT: 30.5 % — ABNORMAL LOW (ref 36.0–46.0)
Hemoglobin: 9.7 g/dL — ABNORMAL LOW (ref 12.0–15.0)
MCH: 28.8 pg (ref 26.0–34.0)
MCHC: 31.8 g/dL (ref 30.0–36.0)
MCV: 90.5 fL (ref 80.0–100.0)
Platelets: 243 10*3/uL (ref 150–400)
RBC: 3.37 MIL/uL — ABNORMAL LOW (ref 3.87–5.11)
RDW: 14.2 % (ref 11.5–15.5)
WBC: 8.4 10*3/uL (ref 4.0–10.5)
nRBC: 0 % (ref 0.0–0.2)

## 2021-01-30 ENCOUNTER — Encounter (HOSPITAL_COMMUNITY): Payer: Medicare Other

## 2021-01-30 ENCOUNTER — Encounter (HOSPITAL_COMMUNITY)
Admission: RE | Admit: 2021-01-30 | Discharge: 2021-01-30 | Disposition: A | Discharge status: Home / Self Care | Payer: Medicare Other | Source: Ambulatory Visit | Attending: Nephrology | Admitting: Nephrology

## 2021-01-30 ENCOUNTER — Encounter (HOSPITAL_COMMUNITY): Payer: Self-pay

## 2021-01-30 DIAGNOSIS — E876 Hypokalemia: Secondary | ICD-10-CM | POA: Diagnosis not present

## 2021-01-30 DIAGNOSIS — N184 Chronic kidney disease, stage 4 (severe): Secondary | ICD-10-CM | POA: Insufficient documentation

## 2021-01-30 DIAGNOSIS — D631 Anemia in chronic kidney disease: Secondary | ICD-10-CM | POA: Diagnosis not present

## 2021-01-30 DIAGNOSIS — D638 Anemia in other chronic diseases classified elsewhere: Secondary | ICD-10-CM | POA: Diagnosis not present

## 2021-01-30 DIAGNOSIS — N189 Chronic kidney disease, unspecified: Secondary | ICD-10-CM | POA: Diagnosis not present

## 2021-01-30 DIAGNOSIS — E8722 Chronic metabolic acidosis: Secondary | ICD-10-CM | POA: Diagnosis not present

## 2021-01-30 DIAGNOSIS — R809 Proteinuria, unspecified: Secondary | ICD-10-CM | POA: Diagnosis not present

## 2021-01-30 DIAGNOSIS — I5032 Chronic diastolic (congestive) heart failure: Secondary | ICD-10-CM | POA: Diagnosis not present

## 2021-01-30 DIAGNOSIS — E1122 Type 2 diabetes mellitus with diabetic chronic kidney disease: Secondary | ICD-10-CM | POA: Diagnosis not present

## 2021-01-30 DIAGNOSIS — E1129 Type 2 diabetes mellitus with other diabetic kidney complication: Secondary | ICD-10-CM | POA: Diagnosis not present

## 2021-01-30 DIAGNOSIS — I129 Hypertensive chronic kidney disease with stage 1 through stage 4 chronic kidney disease, or unspecified chronic kidney disease: Secondary | ICD-10-CM | POA: Diagnosis not present

## 2021-01-30 LAB — POCT HEMOGLOBIN-HEMACUE: Hemoglobin: 10.1 g/dL — ABNORMAL LOW (ref 12.0–15.0)

## 2021-02-05 ENCOUNTER — Ambulatory Visit: Payer: Medicare Other | Admitting: *Deleted

## 2021-02-06 ENCOUNTER — Other Ambulatory Visit: Payer: Self-pay | Admitting: Cardiology

## 2021-02-06 DIAGNOSIS — I25119 Atherosclerotic heart disease of native coronary artery with unspecified angina pectoris: Secondary | ICD-10-CM | POA: Diagnosis not present

## 2021-02-06 DIAGNOSIS — I10 Essential (primary) hypertension: Secondary | ICD-10-CM | POA: Diagnosis not present

## 2021-02-06 DIAGNOSIS — D6869 Other thrombophilia: Secondary | ICD-10-CM | POA: Diagnosis not present

## 2021-02-06 DIAGNOSIS — Z299 Encounter for prophylactic measures, unspecified: Secondary | ICD-10-CM | POA: Diagnosis not present

## 2021-02-06 DIAGNOSIS — R5383 Other fatigue: Secondary | ICD-10-CM | POA: Diagnosis not present

## 2021-02-06 DIAGNOSIS — I700 Atherosclerosis of aorta: Secondary | ICD-10-CM | POA: Diagnosis not present

## 2021-02-11 ENCOUNTER — Other Ambulatory Visit: Payer: Self-pay | Admitting: Cardiology

## 2021-02-11 NOTE — Telephone Encounter (Signed)
Prescription refill request for Eliquis received. Indication: PAF Last office visit: 12/04/20  Myles Gip MD Scr: 2.62 on 01/29/21 Age: 87 Weight: 53.8kg  Based on above findings Eliquis 2.5mg  twice daily is the appropriate dose.  Refill approved.

## 2021-02-18 ENCOUNTER — Other Ambulatory Visit: Payer: Self-pay | Admitting: Gastroenterology

## 2021-02-27 ENCOUNTER — Encounter (HOSPITAL_COMMUNITY): Payer: Self-pay

## 2021-02-27 ENCOUNTER — Encounter (HOSPITAL_COMMUNITY)
Admission: RE | Admit: 2021-02-27 | Discharge: 2021-02-27 | Disposition: A | Discharge status: Home / Self Care | Payer: Medicare Other | Source: Ambulatory Visit | Attending: Nephrology | Admitting: Nephrology

## 2021-02-27 DIAGNOSIS — N185 Chronic kidney disease, stage 5: Secondary | ICD-10-CM | POA: Diagnosis not present

## 2021-02-27 DIAGNOSIS — D631 Anemia in chronic kidney disease: Secondary | ICD-10-CM | POA: Insufficient documentation

## 2021-02-27 LAB — CBC WITH DIFFERENTIAL/PLATELET
Abs Immature Granulocytes: 0.05 10*3/uL (ref 0.00–0.07)
Basophils Absolute: 0.1 10*3/uL (ref 0.0–0.1)
Basophils Relative: 1 %
Eosinophils Absolute: 0.2 10*3/uL (ref 0.0–0.5)
Eosinophils Relative: 4 %
HCT: 30.7 % — ABNORMAL LOW (ref 36.0–46.0)
Hemoglobin: 9.9 g/dL — ABNORMAL LOW (ref 12.0–15.0)
Immature Granulocytes: 1 %
Lymphocytes Relative: 18 %
Lymphs Abs: 1.2 10*3/uL (ref 0.7–4.0)
MCH: 28.5 pg (ref 26.0–34.0)
MCHC: 32.2 g/dL (ref 30.0–36.0)
MCV: 88.5 fL (ref 80.0–100.0)
Monocytes Absolute: 0.8 10*3/uL (ref 0.1–1.0)
Monocytes Relative: 12 %
Neutro Abs: 4.2 10*3/uL (ref 1.7–7.7)
Neutrophils Relative %: 64 %
Platelets: 244 10*3/uL (ref 150–400)
RBC: 3.47 MIL/uL — ABNORMAL LOW (ref 3.87–5.11)
RDW: 14.2 % (ref 11.5–15.5)
WBC: 6.5 10*3/uL (ref 4.0–10.5)
nRBC: 0 % (ref 0.0–0.2)

## 2021-02-27 LAB — RENAL FUNCTION PANEL
Albumin: 4.1 g/dL (ref 3.5–5.0)
Anion gap: 8 (ref 5–15)
BUN: 58 mg/dL — ABNORMAL HIGH (ref 8–23)
CO2: 27 mmol/L (ref 22–32)
Calcium: 9.1 mg/dL (ref 8.9–10.3)
Chloride: 99 mmol/L (ref 98–111)
Creatinine, Ser: 2.44 mg/dL — ABNORMAL HIGH (ref 0.44–1.00)
GFR, Estimated: 19 mL/min — ABNORMAL LOW (ref >60)
Glucose, Bld: 102 mg/dL — ABNORMAL HIGH (ref 70–99)
Phosphorus: 4 mg/dL (ref 2.5–4.6)
Potassium: 3.4 mmol/L — ABNORMAL LOW (ref 3.5–5.1)
Sodium: 134 mmol/L — ABNORMAL LOW (ref 135–145)

## 2021-02-27 LAB — FERRITIN: Ferritin: 187 ng/mL (ref 11–307)

## 2021-02-27 LAB — PROTEIN / CREATININE RATIO, URINE
Creatinine, Urine: 16.27 mg/dL
Protein Creatinine Ratio: 0.55 mg/mg{Cre} — ABNORMAL HIGH (ref 0.00–0.15)
Total Protein, Urine: 9 mg/dL

## 2021-02-27 LAB — POCT HEMOGLOBIN-HEMACUE: Hemoglobin: 10.1 g/dL — ABNORMAL LOW (ref 12.0–15.0)

## 2021-02-27 LAB — IRON AND TIBC
Iron: 52 ug/dL (ref 28–170)
Saturation Ratios: 15 % (ref 10.4–31.8)
TIBC: 357 ug/dL (ref 250–450)
UIBC: 305 ug/dL

## 2021-02-27 MED ORDER — EPOETIN ALFA-EPBX 3000 UNIT/ML IJ SOLN: 3000.0000 [IU] | Freq: Once | INTRAMUSCULAR | Status: DC

## 2021-02-28 LAB — PTH, INTACT AND CALCIUM
Calcium, Total (PTH): 9.4 mg/dL (ref 8.7–10.3)
PTH: 59 pg/mL (ref 15–65)

## 2021-03-01 DIAGNOSIS — E876 Hypokalemia: Secondary | ICD-10-CM | POA: Diagnosis not present

## 2021-03-01 DIAGNOSIS — R809 Proteinuria, unspecified: Secondary | ICD-10-CM | POA: Diagnosis not present

## 2021-03-01 DIAGNOSIS — I5032 Chronic diastolic (congestive) heart failure: Secondary | ICD-10-CM | POA: Diagnosis not present

## 2021-03-01 DIAGNOSIS — E1129 Type 2 diabetes mellitus with other diabetic kidney complication: Secondary | ICD-10-CM | POA: Diagnosis not present

## 2021-03-01 DIAGNOSIS — I129 Hypertensive chronic kidney disease with stage 1 through stage 4 chronic kidney disease, or unspecified chronic kidney disease: Secondary | ICD-10-CM | POA: Diagnosis not present

## 2021-03-01 DIAGNOSIS — E1122 Type 2 diabetes mellitus with diabetic chronic kidney disease: Secondary | ICD-10-CM | POA: Diagnosis not present

## 2021-03-01 DIAGNOSIS — D638 Anemia in other chronic diseases classified elsewhere: Secondary | ICD-10-CM | POA: Diagnosis not present

## 2021-03-01 DIAGNOSIS — N189 Chronic kidney disease, unspecified: Secondary | ICD-10-CM | POA: Diagnosis not present

## 2021-03-05 ENCOUNTER — Other Ambulatory Visit: Payer: Self-pay | Admitting: Cardiology

## 2021-03-08 ENCOUNTER — Other Ambulatory Visit: Payer: Self-pay

## 2021-03-08 ENCOUNTER — Other Ambulatory Visit (HOSPITAL_COMMUNITY)
Admission: RE | Admit: 2021-03-08 | Discharge: 2021-03-08 | Disposition: A | Discharge status: Home / Self Care | Payer: Medicare Other | Source: Ambulatory Visit | Attending: Nephrology | Admitting: Nephrology

## 2021-03-08 DIAGNOSIS — N189 Chronic kidney disease, unspecified: Secondary | ICD-10-CM | POA: Insufficient documentation

## 2021-03-08 DIAGNOSIS — E1122 Type 2 diabetes mellitus with diabetic chronic kidney disease: Secondary | ICD-10-CM | POA: Diagnosis not present

## 2021-03-08 DIAGNOSIS — R809 Proteinuria, unspecified: Secondary | ICD-10-CM | POA: Diagnosis not present

## 2021-03-08 DIAGNOSIS — D638 Anemia in other chronic diseases classified elsewhere: Secondary | ICD-10-CM | POA: Insufficient documentation

## 2021-03-08 DIAGNOSIS — E876 Hypokalemia: Secondary | ICD-10-CM | POA: Diagnosis not present

## 2021-03-08 DIAGNOSIS — I5032 Chronic diastolic (congestive) heart failure: Secondary | ICD-10-CM | POA: Diagnosis not present

## 2021-03-08 DIAGNOSIS — E1129 Type 2 diabetes mellitus with other diabetic kidney complication: Secondary | ICD-10-CM | POA: Insufficient documentation

## 2021-03-08 DIAGNOSIS — I129 Hypertensive chronic kidney disease with stage 1 through stage 4 chronic kidney disease, or unspecified chronic kidney disease: Secondary | ICD-10-CM | POA: Diagnosis not present

## 2021-03-08 DIAGNOSIS — N170 Acute kidney failure with tubular necrosis: Secondary | ICD-10-CM | POA: Diagnosis not present

## 2021-03-08 LAB — RENAL FUNCTION PANEL
Albumin: 3.8 g/dL (ref 3.5–5.0)
Anion gap: 9 (ref 5–15)
BUN: 77 mg/dL — ABNORMAL HIGH (ref 8–23)
CO2: 27 mmol/L (ref 22–32)
Calcium: 9.4 mg/dL (ref 8.9–10.3)
Chloride: 100 mmol/L (ref 98–111)
Creatinine, Ser: 3.31 mg/dL — ABNORMAL HIGH (ref 0.44–1.00)
GFR, Estimated: 13 mL/min — ABNORMAL LOW (ref >60)
Glucose, Bld: 117 mg/dL — ABNORMAL HIGH (ref 70–99)
Phosphorus: 4.7 mg/dL — ABNORMAL HIGH (ref 2.5–4.6)
Potassium: 4.1 mmol/L (ref 3.5–5.1)
Sodium: 136 mmol/L (ref 135–145)

## 2021-03-13 ENCOUNTER — Ambulatory Visit (INDEPENDENT_AMBULATORY_CARE_PROVIDER_SITE_OTHER): Payer: Medicare Other | Admitting: Cardiology

## 2021-03-13 ENCOUNTER — Other Ambulatory Visit: Payer: Self-pay

## 2021-03-13 ENCOUNTER — Encounter: Payer: Self-pay | Admitting: Cardiology

## 2021-03-13 VITALS — BP 142/52 | HR 48 | Ht 65.0 in | Wt 114.2 lb

## 2021-03-13 DIAGNOSIS — I480 Paroxysmal atrial fibrillation: Secondary | ICD-10-CM

## 2021-03-13 DIAGNOSIS — I25119 Atherosclerotic heart disease of native coronary artery with unspecified angina pectoris: Secondary | ICD-10-CM | POA: Diagnosis not present

## 2021-03-13 DIAGNOSIS — I10 Essential (primary) hypertension: Secondary | ICD-10-CM | POA: Diagnosis not present

## 2021-03-13 MED ORDER — CARVEDILOL 6.25 MG PO TABS: 6.2500 mg | ORAL_TABLET | Freq: Two times a day (BID) | ORAL | 3 refills | Status: DC

## 2021-03-13 NOTE — Progress Notes (Signed)
Cardiology Office Note  Date: 03/13/2021   ID: Crystal [name], DOB [birth year], MRN [id number]  PCP:  Glenda Chroman, MD  Cardiologist:  Rozann Lesches, MD Electrophysiologist:  None   Chief Complaint  Patient presents with   Cardiac follow-up    History of Present Illness: Crystal [name] is an 87 y.o. female last seen in November 2022.  She is here for a follow-up visit.  Reports no active angina symptoms, generally fatigued, has also been having some night sweats.  I reviewed her recent lab work, creatinine up to 3.31.  She continues to follow with Dr. Theador Hawthorne, I reviewed his recent note and medication adjustments.  We went over her medications today and discussed reducing Coreg given low resting heart rates and her fatigue.  I did not change her diuretics since these had recently been addressed by Dr. Theador Hawthorne.  She has follow-up with him in the next few weeks.  Past Medical History:  Diagnosis Date   Anemia    Bell's palsy    Breast cancer ([place]) 1998   Right mastectomy   CAD (coronary artery disease)    a. s/p STEMI in 01/2019 with DES to LAD b. s/p NSTEMI in 12/2019 with DES to RCA c. cath in 01/2020 showing patent stents   CHF (congestive heart failure) ([place])    a. EF 30-35% in 01/2019 b. 40-45% in 01/2020 c. EF at 65-70% by echo in 05/2020   Chronic kidney disease    Essential hypertension    GERD (gastroesophageal reflux disease)    Gout    History of skin cancer    Squamous cell, left shoulder   Mixed hyperlipidemia    Osteopenia    Paroxysmal atrial fibrillation (HCC)    Thyroid nodule    Type 2 diabetes mellitus ([place])     Past Surgical History:  Procedure Laterality Date   BIOPSY  10/13/2019   Procedure: BIOPSY;  Surgeon: Daneil Dolin, MD;  [facility];  Service: Endoscopy;;   CATARACT EXTRACTION  2016   COLONOSCOPY  2019   Dr Anthony Sar   CORONARY ANGIOGRAPHY N/A 01/16/2020   Procedure: CORONARY ANGIOGRAPHY;  Surgeon: Jettie Booze, MD;  Location: [place] [facility];  Service: Cardiovascular;  Laterality: N/A;   CORONARY STENT INTERVENTION N/A 01/16/2020   Procedure: CORONARY STENT INTERVENTION;  Surgeon: Jettie Booze, MD;  Location: [place] [facility];  Service: Cardiovascular;  Laterality: N/A;   CORONARY/GRAFT ACUTE MI REVASCULARIZATION N/A 01/30/2019   Procedure: Coronary/Graft Acute MI Revascularization;  Surgeon: Burnell Blanks, MD;  Location: [place] [facility];  Service: Cardiovascular;  Laterality: N/A;   ESOPHAGOGASTRODUODENOSCOPY (EGD) WITH PROPOFOL N/A 10/13/2019   Non-obstructing Schatzki ring at GE junction, s/p dilation, erosive gastropathy with stigmata of recent bleeding, normal duodenum. Negative H.pylori.    HYSTEROSCOPY     INTRAVASCULAR ULTRASOUND/IVUS N/A 01/16/2020   Procedure: Intravascular Ultrasound/IVUS;  Surgeon: Jettie Booze, MD;  Location: [place] [facility];  Service: Cardiovascular;  Laterality: N/A;   LAPAROSCOPIC APPENDECTOMY N/A 08/03/2020   Procedure: APPENDECTOMY LAPAROSCOPIC;  Surgeon: Ronny Bacon, MD;  [facility];  Service: General;  Laterality: N/A;   LEFT HEART CATH AND CORONARY ANGIOGRAPHY N/A 01/30/2019   Procedure: LEFT HEART CATH AND CORONARY ANGIOGRAPHY;  Surgeon: Burnell Blanks, MD;  Location: [place] [facility];  Service: Cardiovascular;  Laterality: N/A;   LEFT HEART CATH AND CORONARY ANGIOGRAPHY N/A 01/16/2020   Procedure: LEFT HEART CATH AND CORONARY  ANGIOGRAPHY;  Surgeon: Jettie Booze, MD;  Location: [place] [facility];  Service: Cardiovascular;  Laterality: N/A;   MALONEY DILATION N/A 10/13/2019   Procedure: Venia Minks DILATION;  Surgeon: Daneil Dolin, MD;  [facility];  Service: Endoscopy;  Laterality: N/A;   Right mastectomy  1998   [place]   RIGHT/LEFT HEART CATH AND CORONARY ANGIOGRAPHY N/A 02/13/2020   Procedure: RIGHT/LEFT HEART CATH AND CORONARY ANGIOGRAPHY;  Surgeon: Martinique, Peter M, MD;   Location: [place] [facility];  Service: Cardiovascular;  Laterality: N/A;    Current Outpatient Medications  Medication Sig Dispense Refill   acetaminophen (TYLENOL) 500 MG tablet Take 500 mg by mouth every 6 (six) hours as needed for headache (pain).     amiodarone (PACERONE) 200 MG tablet Take 200 mg by mouth daily.     apixaban (ELIQUIS) 2.5 MG TABS tablet TAKE (1) TABLET TWICE DAILY. 60 tablet 5   CALCIUM CARB-CHOLECALCIFEROL PO Take 1 tablet by mouth daily.     carvedilol (COREG) 6.25 MG tablet Take 1 tablet (6.25 mg total) by mouth 2 (two) times daily. 180 tablet 3   cholecalciferol (VITAMIN D3) 25 MCG (1000 UT) tablet Take 1,000 Units by mouth daily with breakfast.     diazepam (VALIUM) 2 MG tablet Take 2 mg by mouth as needed.     docusate sodium (COLACE) 100 MG capsule Take 100 mg by mouth daily as needed for mild constipation.     ferrous sulfate 325 (65 FE) MG tablet Take 325 mg by mouth daily before lunch.     hydrALAZINE (APRESOLINE) 100 MG tablet Take 100 mg by mouth in the morning, at noon, and at bedtime.     isosorbide mononitrate (IMDUR) 120 MG 24 hr tablet TAKE 1 TABLET BY MOUTH AT BEDTIME. 90 tablet 3   loperamide (IMODIUM) 2 MG capsule Take by mouth as needed for diarrhea or loose stools.     meclizine (ANTIVERT) 25 MG tablet Take 25 mg by mouth as needed for dizziness.     Multiple Vitamin (MULTIVITAMIN WITH MINERALS) TABS tablet Take 1 tablet by mouth daily. Centrum Silver for Women     multivitamin-lutein (OCUVITE-LUTEIN) CAPS capsule Take 1 capsule by mouth daily before lunch.      nitroGLYCERIN (NITROSTAT) 0.4 MG SL tablet PLACE 1 TAB UNDER TONGUE EVERY 3 MINUTES AS DIRECTED UP TO 3 TIMES AS NEEDED FOR CHEST PAIN. 25 tablet 0   pantoprazole (PROTONIX) 40 MG tablet TAKE 1 TABLET BY MOUTH DAILY--TAKE 30 MINUTES BEFORE BREAKFAST. 90 tablet 3   potassium chloride (KLOR-CON) 10 MEQ tablet Take 10 mEq by mouth daily.     rosuvastatin (CRESTOR) 5 MG tablet TAKE 1/2 TABLET  EVERY OTHER DAY. 7 tablet 0   saccharomyces boulardii (FLORASTOR) 250 MG capsule Take 250 mg by mouth as needed.     Simethicone (GAS-X PO) Take by mouth as needed.     sodium chloride (OCEAN) 0.65 % SOLN nasal spray Place 1 spray into both nostrils as needed for congestion.     spironolactone (ALDACTONE) 25 MG tablet Take 12.5 mg by mouth daily.     torsemide (DEMADEX) 20 MG tablet Take 20 mg by mouth daily.     No current facility-administered medications for this visit.   Allergies:  Bactrim [sulfamethoxazole-trimethoprim], Sulfa antibiotics, Amlodipine, Clonidine derivatives, Evista [raloxifene hcl], Fosamax [alendronate sodium], Glipizide, Lipitor [atorvastatin calcium], Losartan, Pravastatin, and Azithromycin   ROS: No palpitations.  Physical Exam: VS:  BP (!) 142/52  Pulse (!) 48    Ht 5\' 5"  (1.651 m)    Wt 114 lb 3.2 oz (51.8 kg)    SpO2 98%    BMI 19.00 kg/m , BMI Body mass index is 19 kg/m.  Wt Readings from Last 3 Encounters:  03/13/21 114 lb 3.2 oz (51.8 kg)  02/27/21 116 lb 10 oz (52.9 kg)  01/08/21 116 lb 9.6 oz (52.9 kg)    General: Patient appears comfortable at rest. HEENT: Conjunctiva and lids normal, wearing a mask. Neck: Supple, no elevated JVP or carotid bruits, no thyromegaly. Lungs: Clear to auscultation, nonlabored breathing at rest. Cardiac: Regular rate and rhythm, no S3, 2/6 systolic murmur, no pericardial rub. Extremities: No pitting edema.  ECG:  An ECG dated 12/04/2020 was personally reviewed today and demonstrated:  Sinus bradycardia.  Recent Labwork: 08/02/2020: ALT 30; AST 30 08/10/2020: Magnesium 1.7 02/27/2021: Hemoglobin 10.1; Platelets 244 03/08/2021: BUN 77; Creatinine, Ser 3.31; Potassium 4.1; Sodium 136     Component Value Date/Time   CHOL 155 02/14/2020 0339   TRIG 74 02/14/2020 0339   HDL 57 02/14/2020 0339   CHOLHDL 2.7 02/14/2020 0339   VLDL 15 02/14/2020 0339   LDLCALC 83 02/14/2020 0339    Other Studies Reviewed  Today:  Echocardiogram 06/05/2020:  1. Left ventricular ejection fraction, by estimation, is 65 to 70%. The  left ventricle has normal function. The left ventricle has no regional  wall motion abnormalities. There is mild left ventricular hypertrophy.  Left ventricular diastolic parameters  are consistent with Grade II diastolic dysfunction (pseudonormalization).   2. Right ventricular systolic function is normal. The right ventricular  size is normal. There is mildly elevated pulmonary artery systolic  pressure. The estimated right ventricular systolic pressure is 11.9 mmHg.   3. Left atrial size was mildly dilated.   4. Right atrial size was moderately dilated.   5. There is a trivial pericardial effusion posterior to the left  ventricle.   6. The mitral valve is abnormal. Mild to moderate mitral valve  regurgitation.   7. Tricuspid valve regurgitation is moderate.   8. The aortic valve is tricuspid. Aortic valve regurgitation is trivial.  Aortic regurgitation PHT measures 956 msec.   9. The inferior vena cava is normal in size with <50% respiratory  variability, suggesting right atrial pressure of 8 mmHg.   Assessment and Plan:  1.  HFpEF, LVEF 65 to 70% with moderate diastolic dysfunction and moderately elevated RVSP.  She is on diuretics primarily on the direction of nephrology at this time and also in the setting of CKD stage IV with progressive renal insufficiency.  2.  Essential hypertension on multimodal therapy.  Given current low resting heart rates we will reduce Coreg to 6.25 mg twice daily.  3.  CAD status post DES to the LAD in January 2021 and DES to the mid RCA in December 2021.  Plan to continue Coreg at lower dose along with Crestor.  She is not on aspirin given use of Eliquis.  4.  Paroxysmal atrial fibrillation with CHA2DS2-VASc score of 7.  Continue amiodarone for rhythm suppression, reducing Coreg dose due to low resting heart rates, continue Eliquis for stroke  prophylaxis.  Medication Adjustments/Labs and Tests Ordered: Current medicines are reviewed at length with the patient today.  Concerns regarding medicines are outlined above.   Tests Ordered: No orders of the defined types were placed in this encounter.   Medication Changes: Meds ordered this encounter  Medications   carvedilol (COREG)  6.25 MG tablet    Sig: Take 1 tablet (6.25 mg total) by mouth 2 (two) times daily.    Dispense:  180 tablet    Refill:  3    03/13/21 Dose decrease    Disposition:  Follow up  3 months.  Signed, Satira Sark, MD, [facility] 03/13/2021 11:44 AM    [facility] [place], [place], [place] [address] Phone: [phone]; Fax: [phone]

## 2021-03-13 NOTE — Patient Instructions (Addendum)
Medication Instructions:  Your physician has recommended you make the following change in your medication:  Decrease Carvedilol to 6.25 mg twice daily Continue other medications as directed  Labwork: None  Testing/Procedures: None  Follow-Up: Your physician recommends that you schedule a follow-up appointment in: 3 months  Any Other Special Instructions Will Be Listed Below (If Applicable).  If you need a refill on your cardiac medications before your next appointment, please call your pharmacy.

## 2021-03-21 IMAGING — DX DG CHEST 2V
2 series · 2 of 2 positions shown · non-contrast
Comparison: February 12, 2020.

CLINICAL DATA: Shortness of breath.

EXAM:
CHEST - 2 VIEW

[chest lat]
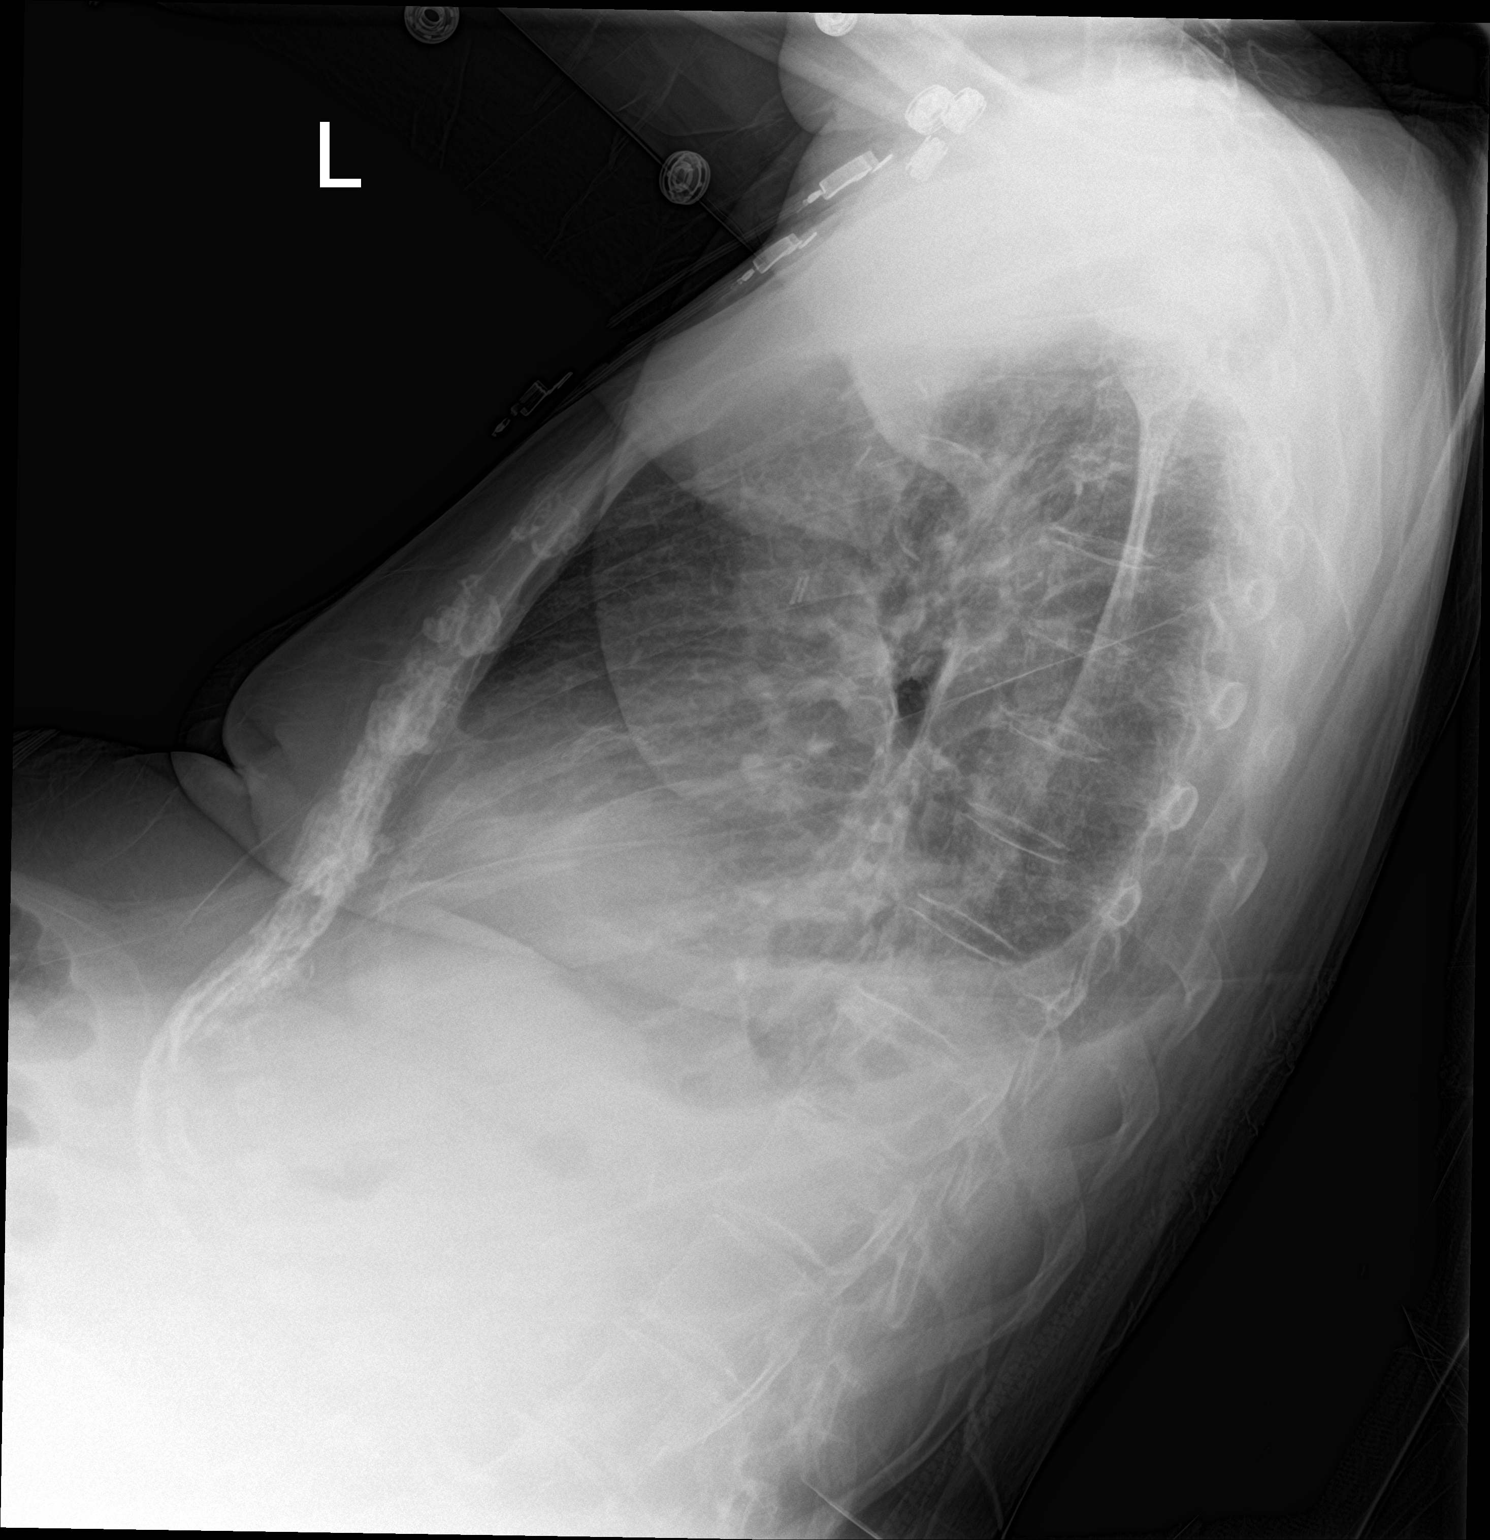

[chest ap]
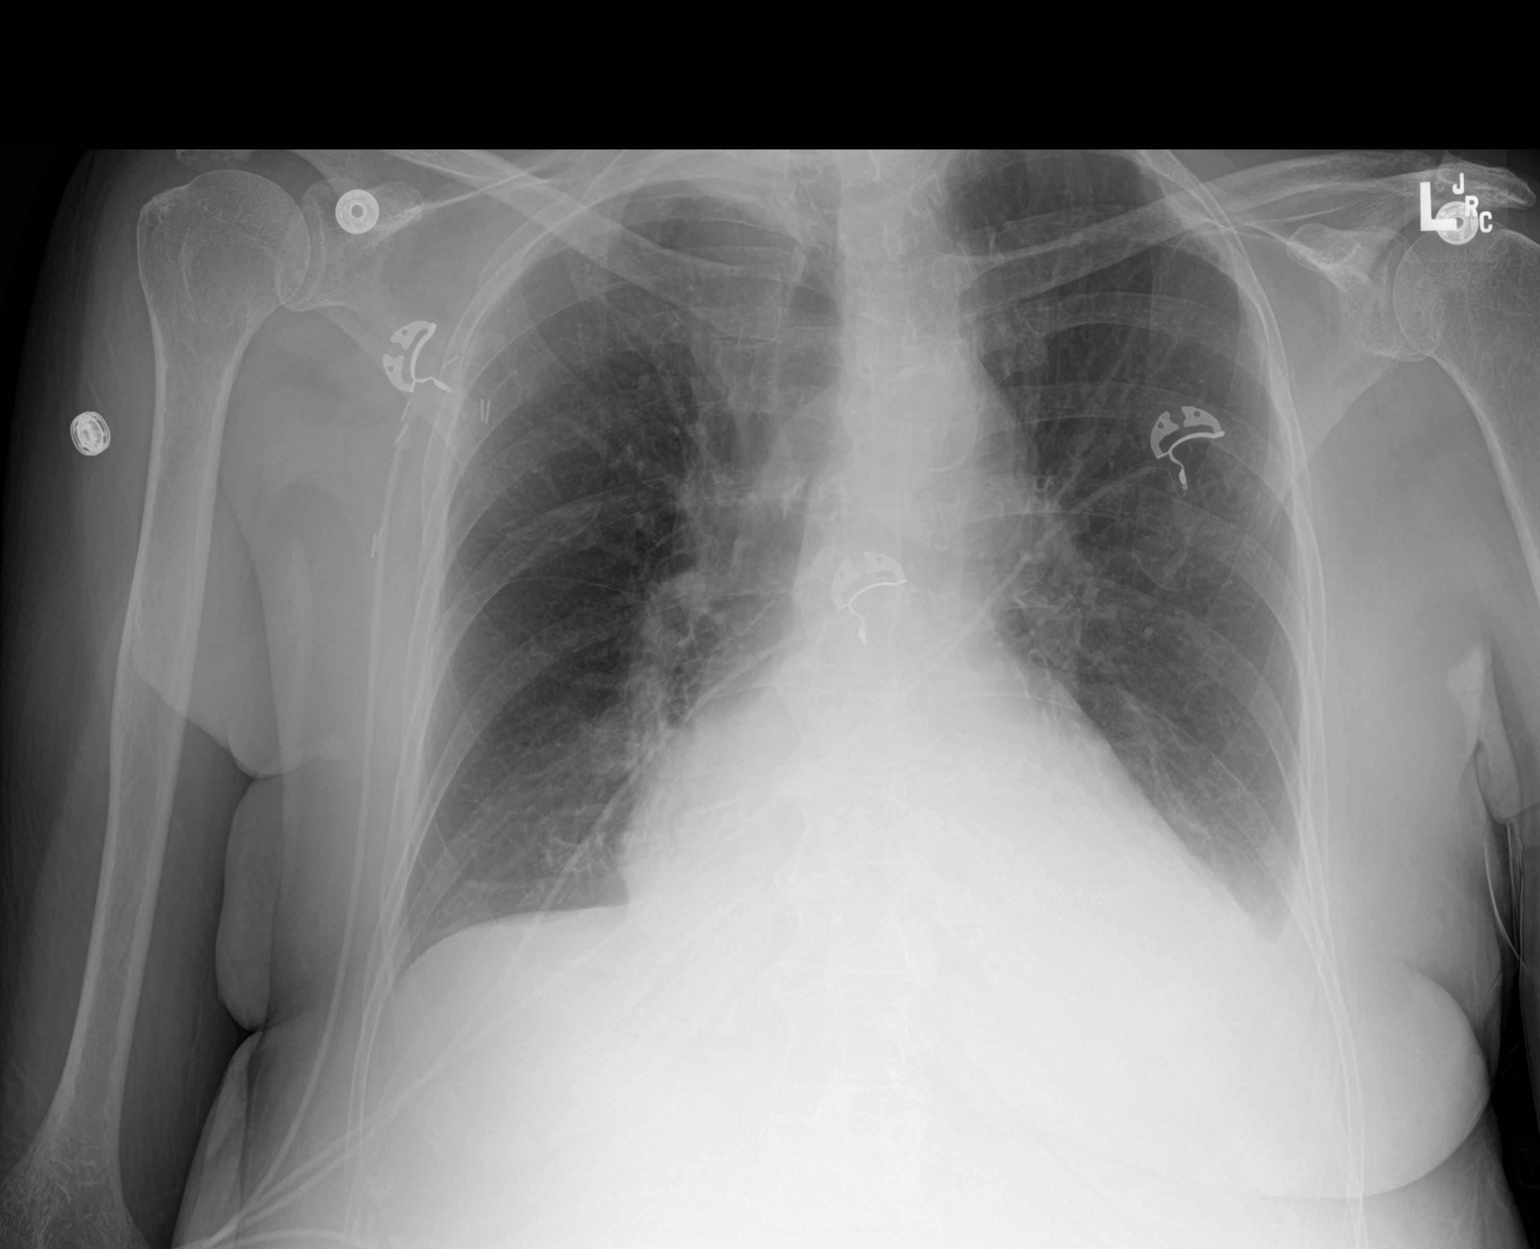

[2 of 2 positions shown; findings below may reference images not displayed]

FINDINGS: Stable cardiomediastinal silhouette. No pneumothorax is noted. Small
bilateral pleural effusions are noted. Mild left basilar
subsegmental atelectasis or infiltrate may be present. Bony thorax
is unremarkable.
IMPRESSION: Small bilateral pleural effusions. Mild left basilar subsegmental
atelectasis or infiltrate may be present.

Aortic Atherosclerosis ([id number]-[id number].A).

## 2021-03-26 DIAGNOSIS — E7800 Pure hypercholesterolemia, unspecified: Secondary | ICD-10-CM | POA: Diagnosis not present

## 2021-03-26 DIAGNOSIS — I10 Essential (primary) hypertension: Secondary | ICD-10-CM | POA: Diagnosis not present

## 2021-03-27 ENCOUNTER — Encounter (HOSPITAL_COMMUNITY): Payer: Self-pay

## 2021-03-27 ENCOUNTER — Encounter (HOSPITAL_COMMUNITY)
Admission: RE | Admit: 2021-03-27 | Discharge: 2021-03-27 | Disposition: A | Discharge status: Home / Self Care | Payer: Medicare Other | Source: Ambulatory Visit | Attending: Nephrology | Admitting: Nephrology

## 2021-03-27 DIAGNOSIS — D631 Anemia in chronic kidney disease: Secondary | ICD-10-CM | POA: Insufficient documentation

## 2021-03-27 DIAGNOSIS — D638 Anemia in other chronic diseases classified elsewhere: Secondary | ICD-10-CM | POA: Insufficient documentation

## 2021-03-27 DIAGNOSIS — E876 Hypokalemia: Secondary | ICD-10-CM | POA: Insufficient documentation

## 2021-03-27 DIAGNOSIS — N170 Acute kidney failure with tubular necrosis: Secondary | ICD-10-CM | POA: Insufficient documentation

## 2021-03-27 DIAGNOSIS — E1122 Type 2 diabetes mellitus with diabetic chronic kidney disease: Secondary | ICD-10-CM | POA: Insufficient documentation

## 2021-03-27 DIAGNOSIS — E1129 Type 2 diabetes mellitus with other diabetic kidney complication: Secondary | ICD-10-CM | POA: Diagnosis not present

## 2021-03-27 DIAGNOSIS — N184 Chronic kidney disease, stage 4 (severe): Secondary | ICD-10-CM | POA: Insufficient documentation

## 2021-03-27 DIAGNOSIS — R809 Proteinuria, unspecified: Secondary | ICD-10-CM | POA: Diagnosis not present

## 2021-03-27 DIAGNOSIS — I129 Hypertensive chronic kidney disease with stage 1 through stage 4 chronic kidney disease, or unspecified chronic kidney disease: Secondary | ICD-10-CM | POA: Insufficient documentation

## 2021-03-27 DIAGNOSIS — I5032 Chronic diastolic (congestive) heart failure: Secondary | ICD-10-CM | POA: Diagnosis not present

## 2021-03-27 DIAGNOSIS — N189 Chronic kidney disease, unspecified: Secondary | ICD-10-CM | POA: Diagnosis not present

## 2021-03-27 DIAGNOSIS — I160 Hypertensive urgency: Secondary | ICD-10-CM | POA: Diagnosis not present

## 2021-03-27 LAB — CBC WITH DIFFERENTIAL/PLATELET
Abs Immature Granulocytes: 0.04 10*3/uL (ref 0.00–0.07)
Basophils Absolute: 0.1 10*3/uL (ref 0.0–0.1)
Basophils Relative: 1 %
Eosinophils Absolute: 0.3 10*3/uL (ref 0.0–0.5)
Eosinophils Relative: 3 %
HCT: 31.5 % — ABNORMAL LOW (ref 36.0–46.0)
Hemoglobin: 10.1 g/dL — ABNORMAL LOW (ref 12.0–15.0)
Immature Granulocytes: 1 %
Lymphocytes Relative: 15 %
Lymphs Abs: 1.2 10*3/uL (ref 0.7–4.0)
MCH: 28.4 pg (ref 26.0–34.0)
MCHC: 32.1 g/dL (ref 30.0–36.0)
MCV: 88.5 fL (ref 80.0–100.0)
Monocytes Absolute: 1 10*3/uL (ref 0.1–1.0)
Monocytes Relative: 13 %
Neutro Abs: 5.3 10*3/uL (ref 1.7–7.7)
Neutrophils Relative %: 67 %
Platelets: 244 10*3/uL (ref 150–400)
RBC: 3.56 MIL/uL — ABNORMAL LOW (ref 3.87–5.11)
RDW: 14.1 % (ref 11.5–15.5)
WBC: 7.8 10*3/uL (ref 4.0–10.5)
nRBC: 0 % (ref 0.0–0.2)

## 2021-03-27 LAB — RENAL FUNCTION PANEL
Albumin: 4.1 g/dL (ref 3.5–5.0)
Anion gap: 11 (ref 5–15)
BUN: 63 mg/dL — ABNORMAL HIGH (ref 8–23)
CO2: 24 mmol/L (ref 22–32)
Calcium: 9.4 mg/dL (ref 8.9–10.3)
Chloride: 99 mmol/L (ref 98–111)
Creatinine, Ser: 2.68 mg/dL — ABNORMAL HIGH (ref 0.44–1.00)
GFR, Estimated: 17 mL/min — ABNORMAL LOW (ref >60)
Glucose, Bld: 106 mg/dL — ABNORMAL HIGH (ref 70–99)
Phosphorus: 4 mg/dL (ref 2.5–4.6)
Potassium: 3.9 mmol/L (ref 3.5–5.1)
Sodium: 134 mmol/L — ABNORMAL LOW (ref 135–145)

## 2021-03-27 LAB — PROTEIN / CREATININE RATIO, URINE
Creatinine, Urine: 22.6 mg/dL
Protein Creatinine Ratio: 0.53 mg/mg{Cre} — ABNORMAL HIGH (ref 0.00–0.15)
Total Protein, Urine: 12 mg/dL

## 2021-03-27 LAB — POCT HEMOGLOBIN-HEMACUE: Hemoglobin: 10.9 g/dL — ABNORMAL LOW (ref 12.0–15.0)

## 2021-03-27 IMAGING — DX DG CHEST 1V
1 series · 1 of 1 positions shown · non-contrast
Comparison: 02/23/2020

CLINICAL DATA: Status post thoracentesis.

EXAM:
CHEST  1 VIEW

[chest]
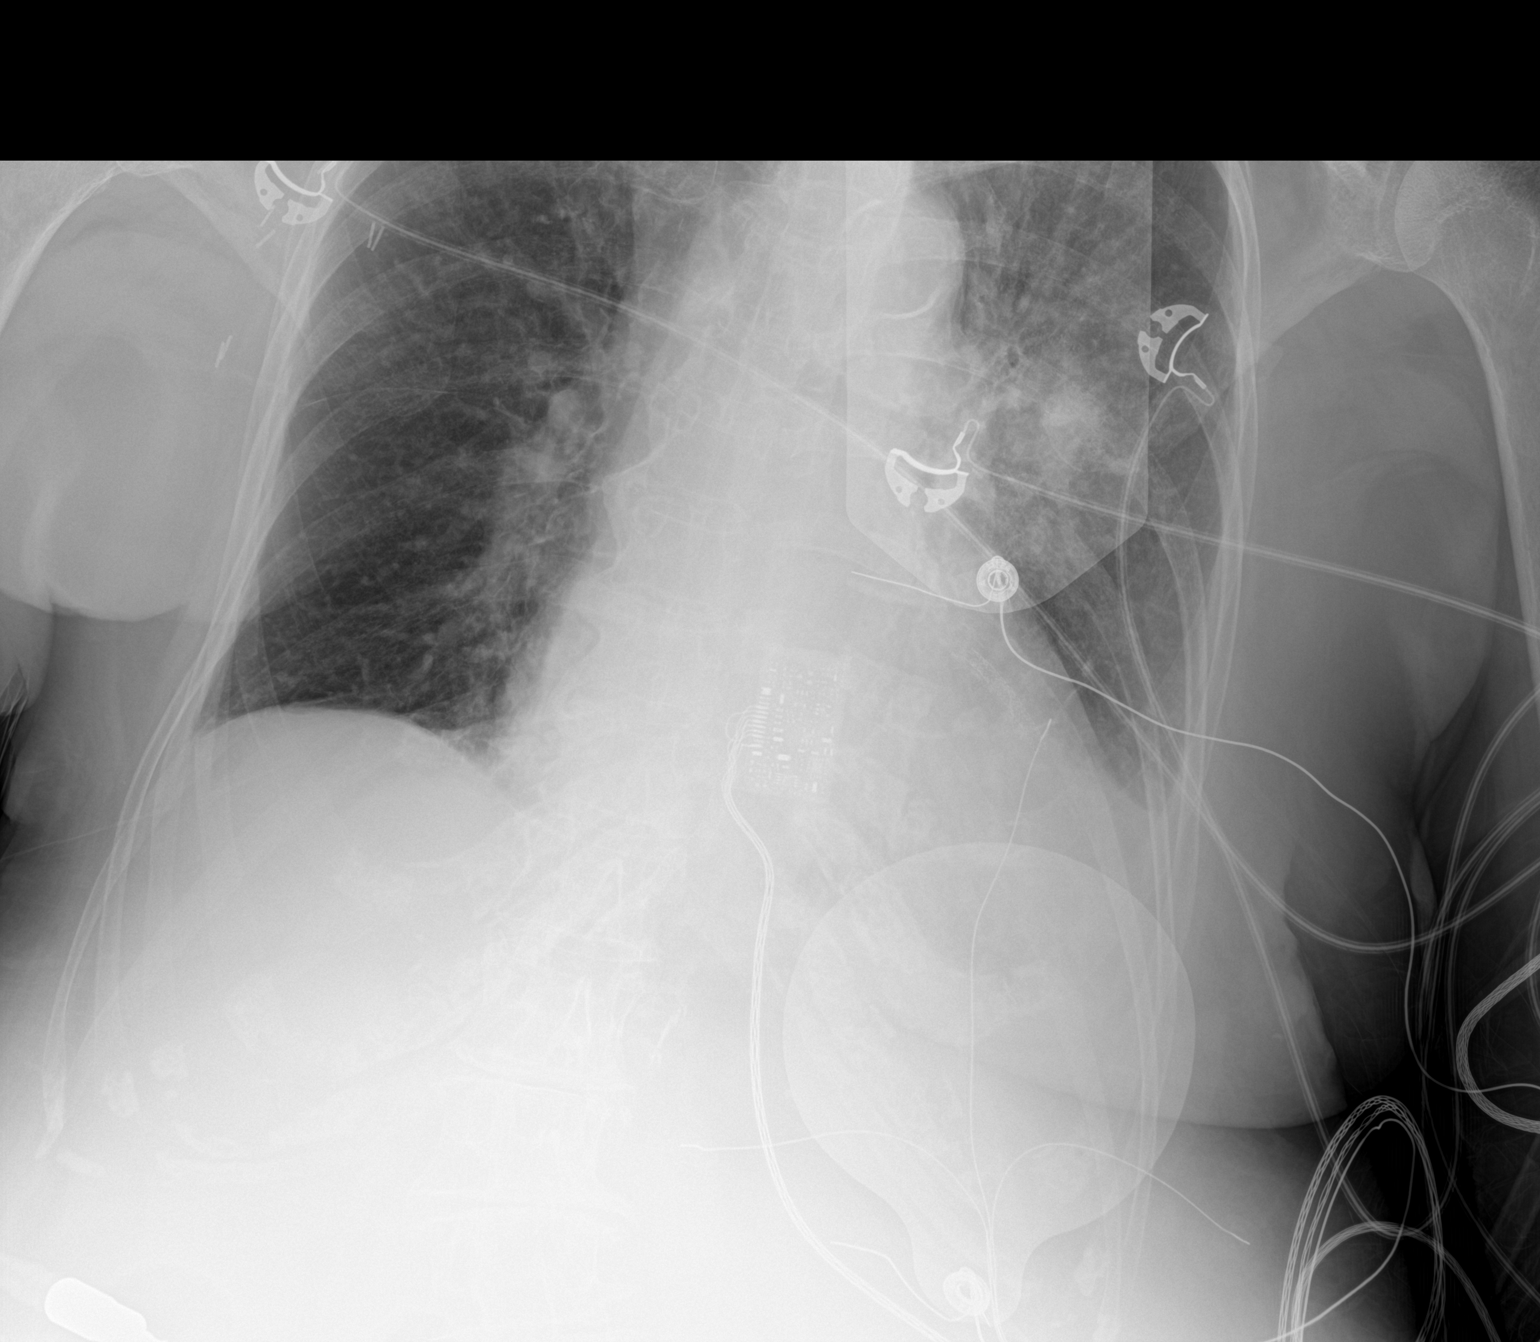

[1 of 1 positions shown; findings below may reference images not displayed]

FINDINGS: Heart size is within normal limits. Mild pulmonary vascular
congestion. Blunted left lateral costophrenic angle likely due to
small pleural effusion.

Exam is limited due to exclusion of the lung apices and multiple
overlying support structures.
IMPRESSION: 1. Interval decrease of bilateral pleural effusions. Small effusion
limb aimed on the left.
2. Exam is limited due to multiple overlying support structures as
well as exclusion of the lung apices. Very small apical pneumothorax
is not excluded.

## 2021-03-27 MED ORDER — EPOETIN ALFA-EPBX 3000 UNIT/ML IJ SOLN: 3000.0000 [IU] | Freq: Once | INTRAMUSCULAR | Status: DC

## 2021-03-28 DIAGNOSIS — J069 Acute upper respiratory infection, unspecified: Secondary | ICD-10-CM | POA: Diagnosis not present

## 2021-03-28 DIAGNOSIS — J309 Allergic rhinitis, unspecified: Secondary | ICD-10-CM | POA: Diagnosis not present

## 2021-03-28 DIAGNOSIS — Z299 Encounter for prophylactic measures, unspecified: Secondary | ICD-10-CM | POA: Diagnosis not present

## 2021-03-31 ENCOUNTER — Other Ambulatory Visit: Payer: Self-pay | Admitting: Cardiology

## 2021-04-09 DIAGNOSIS — R7309 Other abnormal glucose: Secondary | ICD-10-CM | POA: Diagnosis not present

## 2021-04-09 DIAGNOSIS — Z299 Encounter for prophylactic measures, unspecified: Secondary | ICD-10-CM | POA: Diagnosis not present

## 2021-04-09 DIAGNOSIS — Z789 Other specified health status: Secondary | ICD-10-CM | POA: Diagnosis not present

## 2021-04-09 DIAGNOSIS — I10 Essential (primary) hypertension: Secondary | ICD-10-CM | POA: Diagnosis not present

## 2021-04-09 DIAGNOSIS — I700 Atherosclerosis of aorta: Secondary | ICD-10-CM | POA: Diagnosis not present

## 2021-04-12 DIAGNOSIS — Z20828 Contact with and (suspected) exposure to other viral communicable diseases: Secondary | ICD-10-CM | POA: Diagnosis not present

## 2021-04-25 DIAGNOSIS — E7800 Pure hypercholesterolemia, unspecified: Secondary | ICD-10-CM | POA: Diagnosis not present

## 2021-04-25 DIAGNOSIS — I10 Essential (primary) hypertension: Secondary | ICD-10-CM | POA: Diagnosis not present

## 2021-05-01 ENCOUNTER — Encounter (HOSPITAL_COMMUNITY): Payer: Medicare Other

## 2021-05-01 DIAGNOSIS — Z20822 Contact with and (suspected) exposure to covid-19: Secondary | ICD-10-CM | POA: Diagnosis not present

## 2021-05-02 ENCOUNTER — Encounter (HOSPITAL_COMMUNITY): Payer: Self-pay

## 2021-05-02 ENCOUNTER — Encounter (HOSPITAL_COMMUNITY)
Admission: RE | Admit: 2021-05-02 | Discharge: 2021-05-02 | Disposition: A | Discharge status: Home / Self Care | Payer: Medicare Other | Source: Ambulatory Visit | Attending: Nephrology | Admitting: Nephrology

## 2021-05-02 DIAGNOSIS — D631 Anemia in chronic kidney disease: Secondary | ICD-10-CM | POA: Insufficient documentation

## 2021-05-02 DIAGNOSIS — N184 Chronic kidney disease, stage 4 (severe): Secondary | ICD-10-CM | POA: Insufficient documentation

## 2021-05-02 LAB — POCT HEMOGLOBIN-HEMACUE: Hemoglobin: 9 g/dL — ABNORMAL LOW (ref 12.0–15.0)

## 2021-05-02 MED ORDER — EPOETIN ALFA-EPBX 3000 UNIT/ML IJ SOLN
3000.0000 [IU] | Freq: Once | INTRAMUSCULAR | Status: AC
  Administered 2021-05-02: 3000 [IU] via SUBCUTANEOUS
  Filled 2021-05-02: qty 1

## 2021-05-08 DIAGNOSIS — R059 Cough, unspecified: Secondary | ICD-10-CM | POA: Diagnosis not present

## 2021-05-08 DIAGNOSIS — J069 Acute upper respiratory infection, unspecified: Secondary | ICD-10-CM | POA: Diagnosis not present

## 2021-05-08 DIAGNOSIS — Z299 Encounter for prophylactic measures, unspecified: Secondary | ICD-10-CM | POA: Diagnosis not present

## 2021-05-08 DIAGNOSIS — R5383 Other fatigue: Secondary | ICD-10-CM | POA: Diagnosis not present

## 2021-05-14 ENCOUNTER — Other Ambulatory Visit (HOSPITAL_COMMUNITY)
Admission: RE | Admit: 2021-05-14 | Discharge: 2021-05-14 | Disposition: A | Discharge status: Home / Self Care | Payer: Medicare Other | Source: Ambulatory Visit | Attending: Nephrology | Admitting: Nephrology

## 2021-05-14 DIAGNOSIS — E1129 Type 2 diabetes mellitus with other diabetic kidney complication: Secondary | ICD-10-CM | POA: Diagnosis not present

## 2021-05-14 DIAGNOSIS — R809 Proteinuria, unspecified: Secondary | ICD-10-CM | POA: Insufficient documentation

## 2021-05-14 DIAGNOSIS — E1122 Type 2 diabetes mellitus with diabetic chronic kidney disease: Secondary | ICD-10-CM | POA: Insufficient documentation

## 2021-05-14 DIAGNOSIS — I5032 Chronic diastolic (congestive) heart failure: Secondary | ICD-10-CM | POA: Insufficient documentation

## 2021-05-14 DIAGNOSIS — D638 Anemia in other chronic diseases classified elsewhere: Secondary | ICD-10-CM | POA: Insufficient documentation

## 2021-05-14 DIAGNOSIS — I129 Hypertensive chronic kidney disease with stage 1 through stage 4 chronic kidney disease, or unspecified chronic kidney disease: Secondary | ICD-10-CM | POA: Insufficient documentation

## 2021-05-14 DIAGNOSIS — E876 Hypokalemia: Secondary | ICD-10-CM | POA: Diagnosis not present

## 2021-05-14 DIAGNOSIS — N189 Chronic kidney disease, unspecified: Secondary | ICD-10-CM | POA: Diagnosis not present

## 2021-05-14 LAB — CBC
HCT: 34.6 % — ABNORMAL LOW (ref 36.0–46.0)
Hemoglobin: 10.8 g/dL — ABNORMAL LOW (ref 12.0–15.0)
MCH: 27.4 pg (ref 26.0–34.0)
MCHC: 31.2 g/dL (ref 30.0–36.0)
MCV: 87.8 fL (ref 80.0–100.0)
Platelets: 285 10*3/uL (ref 150–400)
RBC: 3.94 MIL/uL (ref 3.87–5.11)
RDW: 15.9 % — ABNORMAL HIGH (ref 11.5–15.5)
WBC: 8.9 10*3/uL (ref 4.0–10.5)
nRBC: 0 % (ref 0.0–0.2)

## 2021-05-14 LAB — RENAL FUNCTION PANEL
Albumin: 4 g/dL (ref 3.5–5.0)
Anion gap: 10 (ref 5–15)
BUN: 78 mg/dL — ABNORMAL HIGH (ref 8–23)
CO2: 26 mmol/L (ref 22–32)
Calcium: 9.3 mg/dL (ref 8.9–10.3)
Chloride: 100 mmol/L (ref 98–111)
Creatinine, Ser: 2.48 mg/dL — ABNORMAL HIGH (ref 0.44–1.00)
GFR, Estimated: 19 mL/min — ABNORMAL LOW (ref >60)
Glucose, Bld: 103 mg/dL — ABNORMAL HIGH (ref 70–99)
Phosphorus: 4.5 mg/dL (ref 2.5–4.6)
Potassium: 4.4 mmol/L (ref 3.5–5.1)
Sodium: 136 mmol/L (ref 135–145)

## 2021-05-14 LAB — PROTEIN / CREATININE RATIO, URINE
Creatinine, Urine: 18.2 mg/dL
Total Protein, Urine: <6 mg/dL

## 2021-05-16 DIAGNOSIS — E876 Hypokalemia: Secondary | ICD-10-CM | POA: Diagnosis not present

## 2021-05-16 DIAGNOSIS — I160 Hypertensive urgency: Secondary | ICD-10-CM | POA: Diagnosis not present

## 2021-05-16 DIAGNOSIS — R809 Proteinuria, unspecified: Secondary | ICD-10-CM | POA: Diagnosis not present

## 2021-05-16 DIAGNOSIS — I5032 Chronic diastolic (congestive) heart failure: Secondary | ICD-10-CM | POA: Diagnosis not present

## 2021-05-16 DIAGNOSIS — N189 Chronic kidney disease, unspecified: Secondary | ICD-10-CM | POA: Diagnosis not present

## 2021-05-16 DIAGNOSIS — E1129 Type 2 diabetes mellitus with other diabetic kidney complication: Secondary | ICD-10-CM | POA: Diagnosis not present

## 2021-05-16 DIAGNOSIS — D472 Monoclonal gammopathy: Secondary | ICD-10-CM | POA: Diagnosis not present

## 2021-05-16 DIAGNOSIS — D638 Anemia in other chronic diseases classified elsewhere: Secondary | ICD-10-CM | POA: Diagnosis not present

## 2021-05-16 DIAGNOSIS — E1122 Type 2 diabetes mellitus with diabetic chronic kidney disease: Secondary | ICD-10-CM | POA: Diagnosis not present

## 2021-05-23 ENCOUNTER — Other Ambulatory Visit (HOSPITAL_COMMUNITY)
Admission: RE | Admit: 2021-05-23 | Discharge: 2021-05-23 | Disposition: A | Discharge status: Home / Self Care | Payer: Medicare Other | Source: Ambulatory Visit | Attending: Nephrology | Admitting: Nephrology

## 2021-05-23 DIAGNOSIS — R809 Proteinuria, unspecified: Secondary | ICD-10-CM | POA: Diagnosis not present

## 2021-05-23 DIAGNOSIS — I5032 Chronic diastolic (congestive) heart failure: Secondary | ICD-10-CM | POA: Diagnosis not present

## 2021-05-23 DIAGNOSIS — N184 Chronic kidney disease, stage 4 (severe): Secondary | ICD-10-CM | POA: Insufficient documentation

## 2021-05-23 DIAGNOSIS — D631 Anemia in chronic kidney disease: Secondary | ICD-10-CM | POA: Diagnosis not present

## 2021-05-23 DIAGNOSIS — E1122 Type 2 diabetes mellitus with diabetic chronic kidney disease: Secondary | ICD-10-CM | POA: Diagnosis not present

## 2021-05-23 DIAGNOSIS — E876 Hypokalemia: Secondary | ICD-10-CM | POA: Diagnosis not present

## 2021-05-23 DIAGNOSIS — I129 Hypertensive chronic kidney disease with stage 1 through stage 4 chronic kidney disease, or unspecified chronic kidney disease: Secondary | ICD-10-CM | POA: Insufficient documentation

## 2021-05-23 DIAGNOSIS — E1129 Type 2 diabetes mellitus with other diabetic kidney complication: Secondary | ICD-10-CM | POA: Insufficient documentation

## 2021-05-23 LAB — RENAL FUNCTION PANEL
Albumin: 4 g/dL (ref 3.5–5.0)
Anion gap: 10 (ref 5–15)
BUN: 80 mg/dL — ABNORMAL HIGH (ref 8–23)
CO2: 26 mmol/L (ref 22–32)
Calcium: 9.2 mg/dL (ref 8.9–10.3)
Chloride: 101 mmol/L (ref 98–111)
Creatinine, Ser: 2.74 mg/dL — ABNORMAL HIGH (ref 0.44–1.00)
GFR, Estimated: 17 mL/min — ABNORMAL LOW (ref >60)
Glucose, Bld: 101 mg/dL — ABNORMAL HIGH (ref 70–99)
Phosphorus: 4.5 mg/dL (ref 2.5–4.6)
Potassium: 4.3 mmol/L (ref 3.5–5.1)
Sodium: 137 mmol/L (ref 135–145)

## 2021-05-26 DIAGNOSIS — I10 Essential (primary) hypertension: Secondary | ICD-10-CM | POA: Diagnosis not present

## 2021-05-26 DIAGNOSIS — K219 Gastro-esophageal reflux disease without esophagitis: Secondary | ICD-10-CM | POA: Diagnosis not present

## 2021-05-29 DIAGNOSIS — Z20822 Contact with and (suspected) exposure to covid-19: Secondary | ICD-10-CM | POA: Diagnosis not present

## 2021-05-30 ENCOUNTER — Encounter (HOSPITAL_COMMUNITY): Admission: RE | Admit: 2021-05-30 | Payer: Medicare Other | Source: Ambulatory Visit

## 2021-05-30 DIAGNOSIS — J069 Acute upper respiratory infection, unspecified: Secondary | ICD-10-CM | POA: Diagnosis not present

## 2021-05-30 DIAGNOSIS — I10 Essential (primary) hypertension: Secondary | ICD-10-CM | POA: Diagnosis not present

## 2021-05-30 DIAGNOSIS — R110 Nausea: Secondary | ICD-10-CM | POA: Diagnosis not present

## 2021-05-30 DIAGNOSIS — I4891 Unspecified atrial fibrillation: Secondary | ICD-10-CM | POA: Diagnosis not present

## 2021-05-30 DIAGNOSIS — Z299 Encounter for prophylactic measures, unspecified: Secondary | ICD-10-CM | POA: Diagnosis not present

## 2021-06-04 ENCOUNTER — Encounter (HOSPITAL_COMMUNITY)
Admission: RE | Admit: 2021-06-04 | Discharge: 2021-06-04 | Disposition: A | Discharge status: Home / Self Care | Payer: Medicare Other | Source: Ambulatory Visit | Attending: Nephrology | Admitting: Nephrology

## 2021-06-04 DIAGNOSIS — Z20822 Contact with and (suspected) exposure to covid-19: Secondary | ICD-10-CM | POA: Diagnosis not present

## 2021-06-06 DIAGNOSIS — Z20822 Contact with and (suspected) exposure to covid-19: Secondary | ICD-10-CM | POA: Diagnosis not present

## 2021-06-17 ENCOUNTER — Other Ambulatory Visit (HOSPITAL_COMMUNITY)
Admission: RE | Admit: 2021-06-17 | Discharge: 2021-06-17 | Disposition: A | Discharge status: Home / Self Care | Payer: Medicare Other | Source: Ambulatory Visit | Attending: Nephrology | Admitting: Nephrology

## 2021-06-17 DIAGNOSIS — N189 Chronic kidney disease, unspecified: Secondary | ICD-10-CM | POA: Diagnosis not present

## 2021-06-17 LAB — CBC
HCT: 29.4 % — ABNORMAL LOW (ref 36.0–46.0)
Hemoglobin: 9.5 g/dL — ABNORMAL LOW (ref 12.0–15.0)
MCH: 28.2 pg (ref 26.0–34.0)
MCHC: 32.3 g/dL (ref 30.0–36.0)
MCV: 87.2 fL (ref 80.0–100.0)
Platelets: 408 10*3/uL — ABNORMAL HIGH (ref 150–400)
RBC: 3.37 MIL/uL — ABNORMAL LOW (ref 3.87–5.11)
RDW: 15.8 % — ABNORMAL HIGH (ref 11.5–15.5)
WBC: 8.4 10*3/uL (ref 4.0–10.5)
nRBC: 0 % (ref 0.0–0.2)

## 2021-06-17 LAB — RENAL FUNCTION PANEL
Albumin: 3.6 g/dL (ref 3.5–5.0)
Anion gap: 9 (ref 5–15)
BUN: 68 mg/dL — ABNORMAL HIGH (ref 8–23)
CO2: 24 mmol/L (ref 22–32)
Calcium: 9.2 mg/dL (ref 8.9–10.3)
Chloride: 102 mmol/L (ref 98–111)
Creatinine, Ser: 2.35 mg/dL — ABNORMAL HIGH (ref 0.44–1.00)
GFR, Estimated: 20 mL/min — ABNORMAL LOW (ref >60)
Glucose, Bld: 118 mg/dL — ABNORMAL HIGH (ref 70–99)
Phosphorus: 4.4 mg/dL (ref 2.5–4.6)
Potassium: 4 mmol/L (ref 3.5–5.1)
Sodium: 135 mmol/L (ref 135–145)

## 2021-06-17 LAB — IRON AND TIBC
Iron: 47 ug/dL (ref 28–170)
Saturation Ratios: 14 % (ref 10.4–31.8)
TIBC: 346 ug/dL (ref 250–450)
UIBC: 299 ug/dL

## 2021-06-17 LAB — FERRITIN: Ferritin: 249 ng/mL (ref 11–307)

## 2021-06-17 LAB — PROTEIN / CREATININE RATIO, URINE
Creatinine, Urine: 47.47 mg/dL
Protein Creatinine Ratio: 0.57 mg/mg{Cre} — ABNORMAL HIGH (ref 0.00–0.15)
Total Protein, Urine: 27 mg/dL

## 2021-06-18 LAB — PTH, INTACT AND CALCIUM
Calcium, Total (PTH): 9.3 mg/dL (ref 8.7–10.3)
PTH: 30 pg/mL (ref 15–65)

## 2021-06-19 DIAGNOSIS — N189 Chronic kidney disease, unspecified: Secondary | ICD-10-CM | POA: Diagnosis not present

## 2021-06-19 DIAGNOSIS — E1129 Type 2 diabetes mellitus with other diabetic kidney complication: Secondary | ICD-10-CM | POA: Diagnosis not present

## 2021-06-19 DIAGNOSIS — I5032 Chronic diastolic (congestive) heart failure: Secondary | ICD-10-CM | POA: Diagnosis not present

## 2021-06-19 DIAGNOSIS — E876 Hypokalemia: Secondary | ICD-10-CM | POA: Diagnosis not present

## 2021-06-19 DIAGNOSIS — I129 Hypertensive chronic kidney disease with stage 1 through stage 4 chronic kidney disease, or unspecified chronic kidney disease: Secondary | ICD-10-CM | POA: Diagnosis not present

## 2021-06-19 DIAGNOSIS — R809 Proteinuria, unspecified: Secondary | ICD-10-CM | POA: Diagnosis not present

## 2021-06-19 DIAGNOSIS — E1122 Type 2 diabetes mellitus with diabetic chronic kidney disease: Secondary | ICD-10-CM | POA: Diagnosis not present

## 2021-06-19 DIAGNOSIS — D638 Anemia in other chronic diseases classified elsewhere: Secondary | ICD-10-CM | POA: Diagnosis not present

## 2021-06-22 DIAGNOSIS — Z79899 Other long term (current) drug therapy: Secondary | ICD-10-CM | POA: Diagnosis not present

## 2021-06-22 DIAGNOSIS — Z7982 Long term (current) use of aspirin: Secondary | ICD-10-CM | POA: Diagnosis not present

## 2021-06-22 DIAGNOSIS — E119 Type 2 diabetes mellitus without complications: Secondary | ICD-10-CM | POA: Diagnosis not present

## 2021-06-22 DIAGNOSIS — R5381 Other malaise: Secondary | ICD-10-CM | POA: Diagnosis not present

## 2021-06-22 DIAGNOSIS — J180 Bronchopneumonia, unspecified organism: Secondary | ICD-10-CM | POA: Diagnosis not present

## 2021-06-22 DIAGNOSIS — R509 Fever, unspecified: Secondary | ICD-10-CM | POA: Diagnosis not present

## 2021-06-22 DIAGNOSIS — I10 Essential (primary) hypertension: Secondary | ICD-10-CM | POA: Diagnosis not present

## 2021-06-22 DIAGNOSIS — Z888 Allergy status to other drugs, medicaments and biological substances status: Secondary | ICD-10-CM | POA: Diagnosis not present

## 2021-06-22 DIAGNOSIS — Z882 Allergy status to sulfonamides status: Secondary | ICD-10-CM | POA: Diagnosis not present

## 2021-07-02 DIAGNOSIS — Z299 Encounter for prophylactic measures, unspecified: Secondary | ICD-10-CM | POA: Diagnosis not present

## 2021-07-02 DIAGNOSIS — Z7689 Persons encountering health services in other specified circumstances: Secondary | ICD-10-CM | POA: Diagnosis not present

## 2021-07-02 DIAGNOSIS — I10 Essential (primary) hypertension: Secondary | ICD-10-CM | POA: Diagnosis not present

## 2021-07-02 DIAGNOSIS — Z789 Other specified health status: Secondary | ICD-10-CM | POA: Diagnosis not present

## 2021-07-02 DIAGNOSIS — I700 Atherosclerosis of aorta: Secondary | ICD-10-CM | POA: Diagnosis not present

## 2021-07-02 DIAGNOSIS — J439 Emphysema, unspecified: Secondary | ICD-10-CM | POA: Diagnosis not present

## 2021-07-02 DIAGNOSIS — J189 Pneumonia, unspecified organism: Secondary | ICD-10-CM | POA: Diagnosis not present

## 2021-07-04 DIAGNOSIS — H524 Presbyopia: Secondary | ICD-10-CM | POA: Diagnosis not present

## 2021-07-04 DIAGNOSIS — Z961 Presence of intraocular lens: Secondary | ICD-10-CM | POA: Diagnosis not present

## 2021-07-04 DIAGNOSIS — H353132 Nonexudative age-related macular degeneration, bilateral, intermediate dry stage: Secondary | ICD-10-CM | POA: Diagnosis not present

## 2021-07-04 DIAGNOSIS — H52203 Unspecified astigmatism, bilateral: Secondary | ICD-10-CM | POA: Diagnosis not present

## 2021-07-09 ENCOUNTER — Encounter (HOSPITAL_COMMUNITY)
Admission: RE | Admit: 2021-07-09 | Discharge: 2021-07-09 | Disposition: A | Discharge status: Home / Self Care | Payer: Medicare Other | Source: Ambulatory Visit | Attending: Nephrology | Admitting: Nephrology

## 2021-07-09 VITALS — BP 185/40 | HR 66 | Temp 98.3°F | Resp 16

## 2021-07-09 DIAGNOSIS — N184 Chronic kidney disease, stage 4 (severe): Secondary | ICD-10-CM | POA: Diagnosis not present

## 2021-07-09 DIAGNOSIS — D631 Anemia in chronic kidney disease: Secondary | ICD-10-CM | POA: Insufficient documentation

## 2021-07-09 DIAGNOSIS — N170 Acute kidney failure with tubular necrosis: Secondary | ICD-10-CM | POA: Diagnosis not present

## 2021-07-09 LAB — POCT HEMOGLOBIN-HEMACUE: Hemoglobin: 9.3 g/dL — ABNORMAL LOW (ref 12.0–15.0)

## 2021-07-09 MED ORDER — EPOETIN ALFA-EPBX 3000 UNIT/ML IJ SOLN
3000.0000 [IU] | Freq: Once | INTRAMUSCULAR | Status: AC
  Administered 2021-07-09: 3000 [IU] via SUBCUTANEOUS

## 2021-07-09 MED ORDER — EPOETIN ALFA-EPBX 3000 UNIT/ML IJ SOLN
INTRAMUSCULAR | Status: AC
  Filled 2021-07-09: qty 1

## 2021-07-10 ENCOUNTER — Encounter: Payer: Self-pay | Admitting: Gastroenterology

## 2021-07-10 ENCOUNTER — Ambulatory Visit (INDEPENDENT_AMBULATORY_CARE_PROVIDER_SITE_OTHER): Payer: Medicare Other | Admitting: Gastroenterology

## 2021-07-10 VITALS — BP 144/72 | Temp 98.5°F | Ht 65.0 in | Wt 118.0 lb

## 2021-07-10 DIAGNOSIS — D509 Iron deficiency anemia, unspecified: Secondary | ICD-10-CM

## 2021-07-10 DIAGNOSIS — I25119 Atherosclerotic heart disease of native coronary artery with unspecified angina pectoris: Secondary | ICD-10-CM | POA: Diagnosis not present

## 2021-07-10 NOTE — Patient Instructions (Signed)
We will see you back as needed!  You can take pantoprazole every other day for the next 2 weeks, then stop.  If needed, you can take Pepcid over the counter.  Please call with any issues with constipation, swallowing, or blood in stool!  I enjoyed seeing you again today! As you know, I value our relationship and want to provide genuine, compassionate, and quality care. I welcome your feedback. If you receive a survey regarding your visit,  I greatly appreciate you taking time to fill this out. See you next time!  Annitta Needs, PhD, ANP-BC [facility]

## 2021-07-10 NOTE — Progress Notes (Signed)
Gastroenterology Office Note     Primary Care Physician:  Glenda Chroman, MD  Primary Gastroenterologist: Dr. Gala Romney    Chief Complaint   Chief Complaint  Patient presents with   Anemia    Follow up on anemia. States she goes once a month to have iron checked. States constipation is better. Has BM almost daily now.      History of Present Illness   SHANAIYA BENE is an 87 y.o. female presenting today in follow-up with a history of multifactorial normocytic anemia in setting of CKD, IDA component, dysphagia s/p EGD in Sept 2021 with non-obstructing Schatzki ring at GE junction, s/p dilation, erosive gastropathy with stigmata of recent bleeding, normal duodenum. Negative H.pylori. Colonoscopy in 2019.   She has declined capsule/EGD or updated colonoscopy, which was recommended ni light of dropping Hgb last year April 2022. She has been seeing Nephrology routinely and received IV iron ni the past and Epogen. Most recent Hgb 9.3, ferritin 249.    Interested in coming off PPI. BM daily. Not havin to take anything supplemental. Will take gas-x for gas symptoms. No other concerns today.    Past Medical History:  Diagnosis Date   Anemia    Bell's palsy    Breast cancer ([place]) 1998   Right mastectomy   CAD (coronary artery disease)    a. s/p STEMI in 01/2019 with DES to LAD b. s/p NSTEMI in 12/2019 with DES to RCA c. cath in 01/2020 showing patent stents   CHF (congestive heart failure) ([place])    a. EF 30-35% in 01/2019 b. 40-45% in 01/2020 c. EF at 65-70% by echo in 05/2020   Chronic kidney disease    Essential hypertension    GERD (gastroesophageal reflux disease)    Gout    History of skin cancer    Squamous cell, left shoulder   Mixed hyperlipidemia    Osteopenia    Paroxysmal atrial fibrillation (HCC)    Thyroid nodule    Type 2 diabetes mellitus ([place])     Past Surgical History:  Procedure Laterality Date   BIOPSY  10/13/2019   Procedure: BIOPSY;  Surgeon:  Daneil Dolin, MD;  [facility];  Service: Endoscopy;;   CATARACT EXTRACTION  2016   COLONOSCOPY  2019   Dr Anthony Sar   CORONARY ANGIOGRAPHY N/A 01/16/2020   Procedure: CORONARY ANGIOGRAPHY;  Surgeon: Jettie Booze, MD;  Location: [place] [facility];  Service: Cardiovascular;  Laterality: N/A;   CORONARY STENT INTERVENTION N/A 01/16/2020   Procedure: CORONARY STENT INTERVENTION;  Surgeon: Jettie Booze, MD;  Location: [place] [facility];  Service: Cardiovascular;  Laterality: N/A;   CORONARY/GRAFT ACUTE MI REVASCULARIZATION N/A 01/30/2019   Procedure: Coronary/Graft Acute MI Revascularization;  Surgeon: Burnell Blanks, MD;  Location: [place] [facility];  Service: Cardiovascular;  Laterality: N/A;   ESOPHAGOGASTRODUODENOSCOPY (EGD) WITH PROPOFOL N/A 10/13/2019   Non-obstructing Schatzki ring at GE junction, s/p dilation, erosive gastropathy with stigmata of recent bleeding, normal duodenum. Negative H.pylori.    HYSTEROSCOPY     INTRAVASCULAR ULTRASOUND/IVUS N/A 01/16/2020   Procedure: Intravascular Ultrasound/IVUS;  Surgeon: Jettie Booze, MD;  Location: [place] [facility];  Service: Cardiovascular;  Laterality: N/A;   LAPAROSCOPIC APPENDECTOMY N/A 08/03/2020   Procedure: APPENDECTOMY LAPAROSCOPIC;  Surgeon: Ronny Bacon, MD;  Location: [facility];  Service: General;  Laterality: N/A;   LEFT HEART CATH AND CORONARY ANGIOGRAPHY N/A 01/30/2019   Procedure: LEFT HEART  CATH AND CORONARY ANGIOGRAPHY;  Surgeon: Burnell Blanks, MD;  Location: [place] [facility];  Service: Cardiovascular;  Laterality: N/A;   LEFT HEART CATH AND CORONARY ANGIOGRAPHY N/A 01/16/2020   Procedure: LEFT HEART CATH AND CORONARY ANGIOGRAPHY;  Surgeon: Jettie Booze, MD;  Location: [place] [facility];  Service: Cardiovascular;  Laterality: N/A;   MALONEY DILATION N/A 10/13/2019   Procedure: Venia Minks DILATION;  Surgeon: Daneil Dolin, MD;  [facility];  Service:  Endoscopy;  Laterality: N/A;   Right mastectomy  1998   [place]   RIGHT/LEFT HEART CATH AND CORONARY ANGIOGRAPHY N/A 02/13/2020   Procedure: RIGHT/LEFT HEART CATH AND CORONARY ANGIOGRAPHY;  Surgeon: Martinique, Peter M, MD;  Location: [place] [facility];  Service: Cardiovascular;  Laterality: N/A;    Current Outpatient Medications  Medication Sig Dispense Refill   acetaminophen (TYLENOL) 500 MG tablet Take 500 mg by mouth every 6 (six) hours as needed for headache (pain).     amiodarone (PACERONE) 200 MG tablet Take 200 mg by mouth daily.     apixaban (ELIQUIS) 2.5 MG TABS tablet TAKE (1) TABLET TWICE DAILY. 60 tablet 5   CALCIUM CARB-CHOLECALCIFEROL PO Take 1 tablet by mouth daily.     carvedilol (COREG) 6.25 MG tablet Take 1 tablet (6.25 mg total) by mouth 2 (two) times daily. 180 tablet 3   cholecalciferol (VITAMIN D3) 25 MCG (1000 UT) tablet Take 1,000 Units by mouth daily with breakfast.     diazepam (VALIUM) 2 MG tablet Take 2 mg by mouth as needed.     docusate sodium (COLACE) 100 MG capsule Take 100 mg by mouth daily as needed for mild constipation.     ferrous sulfate 325 (65 FE) MG tablet Take 325 mg by mouth daily before lunch.     hydrALAZINE (APRESOLINE) 100 MG tablet Take 100 mg by mouth in the morning, at noon, and at bedtime.     isosorbide mononitrate (IMDUR) 120 MG 24 hr tablet TAKE 1 TABLET BY MOUTH AT BEDTIME. 90 tablet 3   loperamide (IMODIUM) 2 MG capsule Take by mouth as needed for diarrhea or loose stools.     meclizine (ANTIVERT) 25 MG tablet Take 25 mg by mouth as needed for dizziness.     Multiple Vitamin (MULTIVITAMIN WITH MINERALS) TABS tablet Take 1 tablet by mouth daily. Centrum Silver for Women     multivitamin-lutein (OCUVITE-LUTEIN) CAPS capsule Take 1 capsule by mouth daily before lunch.      nitroGLYCERIN (NITROSTAT) 0.4 MG SL tablet PLACE 1 TAB UNDER TONGUE EVERY 3 MINUTES AS DIRECTED UP TO 3 TIMES AS NEEDED FOR CHEST PAIN. 25 tablet 0   pantoprazole  (PROTONIX) 40 MG tablet TAKE 1 TABLET BY MOUTH DAILY--TAKE 30 MINUTES BEFORE BREAKFAST. 90 tablet 3   potassium chloride (KLOR-CON) 10 MEQ tablet Take 10 mEq by mouth daily.     rosuvastatin (CRESTOR) 5 MG tablet TAKE 1/2 TABLET EVERY OTHER DAY. 36 tablet 2   saccharomyces boulardii (FLORASTOR) 250 MG capsule Take 250 mg by mouth as needed.     Simethicone (GAS-X PO) Take by mouth as needed.     sodium chloride (OCEAN) 0.65 % SOLN nasal spray Place 1 spray into both nostrils as needed for congestion.     spironolactone (ALDACTONE) 25 MG tablet Take 12.5 mg by mouth daily.     torsemide (DEMADEX) 20 MG tablet Take 20 mg by mouth daily.     No current facility-administered medications for this visit.  Allergies as of 07/10/2021 - Review Complete 07/10/2021  Allergen Reaction Noted   Bactrim [sulfamethoxazole-trimethoprim] Nausea And Vomiting 06/02/2017   Sulfa antibiotics Nausea And Vomiting 06/02/2017   Amlodipine Other (See Comments) 06/02/2017   Clonidine derivatives Other (See Comments) 06/02/2017   Evista [raloxifene hcl] Other (See Comments) 06/02/2017   Fosamax [alendronate sodium] Other (See Comments) 06/02/2017   Glipizide Other (See Comments) 06/02/2017   Lipitor [atorvastatin calcium] Other (See Comments) 06/02/2017   Losartan Other (See Comments) 06/02/2017   Pravastatin Other (See Comments) 06/02/2017   Azithromycin Rash and Other (See Comments) 06/02/2017    Family History  Problem Relation Age of Onset   Stroke Mother    Heart attack Father    Aneurysm Sister    Heart attack Maternal Uncle    Heart disease Maternal Uncle    Heart attack Paternal Aunt    Heart disease Paternal Aunt    Heart attack Paternal Grandfather    Heart disease Paternal Grandfather    Heart attack Paternal Aunt    Heart disease Paternal Aunt    Heart attack Maternal Uncle    Heart disease Maternal Uncle    Heart attack Maternal Uncle    Heart disease Maternal Uncle    Heart attack  Sister    Heart disease Sister    Cancer Sister    Colon cancer Neg Hx    Colon polyps Neg Hx     Social History   Socioeconomic History   Marital status: Divorced    Spouse name: Not on file   Number of children: Not on file   Years of education: Not on file   Highest education level: Not on file  Occupational History   Not on file  Tobacco Use   Smoking status: Never    Passive exposure: Past   Smokeless tobacco: Never  Vaping Use   Vaping Use: Never used  Substance and Sexual Activity   Alcohol use: Never   Drug use: Never   Sexual activity: Not on file  Other Topics Concern   Not on file  Social History Narrative   Not on file   Social Determinants of Health   Financial Resource Strain: Low Risk  (05/01/2020)   Overall Financial Resource Strain (CARDIA)    Difficulty of Paying Living Expenses: Not hard at all  Food Insecurity: No Food Insecurity (05/01/2020)   Hunger Vital Sign    Worried About Running Out of Food in the Last Year: Never true    Ran Out of Food in the Last Year: Never true  Transportation Needs: No Transportation Needs (05/01/2020)   PRAPARE - Hydrologist (Medical): No    Lack of Transportation (Non-Medical): No  Physical Activity: Insufficiently Active (05/01/2020)   Exercise Vital Sign    Days of Exercise per Week: 1 day    Minutes of Exercise per Session: 20 min  Stress: No Stress Concern Present (05/01/2020)   [place]    Feeling of Stress : Not at all  Social Connections: Unknown (05/15/2020)   Social Connection and Isolation Panel [NHANES]    Frequency of Communication with Friends and Family: More than three times a week    Frequency of Social Gatherings with Friends and Family: More than three times a week    Attends Religious Services: Not on file    Active Member of Clubs or Organizations: Not on file    Attends Archivist  Meetings:  Not on file    Marital Status: Divorced  Recent Concern: Social Connections - Moderately Isolated (05/01/2020)   Social Connection and Isolation Panel [NHANES]    Frequency of Communication with Friends and Family: More than three times a week    Frequency of Social Gatherings with Friends and Family: More than three times a week    Attends Religious Services: 1 to 4 times per year    Active Member of [employer] or Organizations: No    Attends Archivist Meetings: Never    Marital Status: Divorced  Human resources officer Violence: Not At Risk (05/01/2020)   Humiliation, Afraid, Rape, and Kick questionnaire    Fear of Current or Ex-Partner: No    Emotionally Abused: No    Physically Abused: No    Sexually Abused: No     Review of Systems   Gen: Denies any fever, chills, fatigue, weight loss, lack of appetite.  CV: Denies chest pain, heart palpitations, peripheral edema, syncope.  Resp: Denies shortness of breath at rest or with exertion. Denies wheezing or cough.  GI:see HPI GU : Denies urinary burning, urinary frequency, urinary hesitancy MS: Denies joint pain, muscle weakness, cramps, or limitation of movement.  Derm: Denies rash, itching, dry skin Psych: Denies depression, anxiety, memory loss, and confusion Heme: Denies bruising, bleeding, and enlarged lymph nodes.   Physical Exam   BP (!) 144/72 (BP Location: Left Arm, Patient Position: Sitting, Cuff Size: Normal)   Temp 98.5 F (36.9 C) (Oral)   Ht '5\' 5"'$  (1.651 m)   Wt 118 lb (53.5 kg)   BMI 19.64 kg/m  General:   Alert and oriented. Pleasant and cooperative. Well-nourished and well-developed.  Head:  Normocephalic and atraumatic. Eyes:  Without icterus Abdomen:  +BS, soft, non-tender and non-distended. No HSM noted. No guarding or rebound. No masses appreciated.  Rectal:  Deferred  Msk:  Symmetrical without gross deformities. Normal posture. Extremities:  Without edema. Neurologic:  Alert and  oriented x4;  grossly  normal neurologically. Skin:  Intact without significant lesions or rashes. Psych:  Alert and cooperative. Normal mood and affect.   Assessment   LAKETTA SODERBERG is an 87 y.o. female presenting today in follow-up with a history of multifactorial normocytic anemia in setting of CKD, IDA component, dysphagia s/p EGD in Sept 2021 with non-obstructing Schatzki ring at GE junction, s/p dilation, erosive gastropathy with stigmata of recent bleeding, normal duodenum. Negative H.pylori. Colonoscopy in 2019.    Declining any further evaluations for worsening IDA that was noted last year. This has now stabilized, and she is followed by Nephrology closely. No overt signs of GI bleeding.  She is interested in coming off PPI and using only Pepcid prn. We discussed weaning off of this and resume if needed.   As we are not managing anything currently, we can see her back as needed.    PLAN    Take pantoprazole every other day for 2 weeks then stop May take Pepcid prn Call if any concerns Will see back prn    Annitta Needs, PhD, [facility]

## 2021-07-26 DIAGNOSIS — I10 Essential (primary) hypertension: Secondary | ICD-10-CM | POA: Diagnosis not present

## 2021-07-26 DIAGNOSIS — E7800 Pure hypercholesterolemia, unspecified: Secondary | ICD-10-CM | POA: Diagnosis not present

## 2021-07-27 NOTE — Progress Notes (Signed)
Cardiology Office Note  Date: 07/29/2021   ID: Crystal [name], DOB [birth year], MRN [id number]  PCP:  Ignatius Specking, MD  Cardiologist:  Nona Dell, MD Electrophysiologist:  None   Chief Complaint  Patient presents with   Cardiac follow-up    History of Present Illness: Crystal [name] is an 87 y.o. female last seen in February.  She is here for a follow-up visit.  States that she has good days and bad days in terms of overall energy and stamina.  She does not report any active angina however.  Also no significant palpitations on current regimen.  She will have follow-up lab work with PCP in September.  No major bleeding problems on Eliquis, but she does have easy bruising.  She is already on the lowest dose.  Creatinine was 2.35 in May and she continues to follow with Dr. Wolfgang Phoenix.  Weight has been relatively stable.  She does check her blood pressure at home reporting systolics under 150.  I rechecked her blood pressure today at 148/70.   Past Medical History:  Diagnosis Date   Anemia    Bell's palsy    Breast cancer (HCC) 1998   Right mastectomy   CAD (coronary artery disease)    a. s/p STEMI in 01/2019 with DES to LAD b. s/p NSTEMI in 12/2019 with DES to RCA c. cath in 01/2020 showing patent stents   CHF (congestive heart failure) (HCC)    a. EF 30-35% in 01/2019 b. 40-45% in 01/2020 c. EF at 65-70% by echo in 05/2020   Chronic kidney disease    Essential hypertension    GERD (gastroesophageal reflux disease)    Gout    History of skin cancer    Squamous cell, left shoulder   Mixed hyperlipidemia    Osteopenia    Paroxysmal atrial fibrillation (HCC)    Thyroid nodule    Type 2 diabetes mellitus (HCC)     Past Surgical History:  Procedure Laterality Date   BIOPSY  10/13/2019   Procedure: BIOPSY;  Surgeon: Corbin Ade, MD;  Location: [facility];  Service: Endoscopy;;   CATARACT EXTRACTION  2016   COLONOSCOPY  2019   Dr Gabriel Cirri   CORONARY  ANGIOGRAPHY N/A 01/16/2020   Procedure: CORONARY ANGIOGRAPHY;  Surgeon: Corky Crafts, MD;  Location: [facility] INVASIVE CV LAB;  Service: Cardiovascular;  Laterality: N/A;   CORONARY STENT INTERVENTION N/A 01/16/2020   Procedure: CORONARY STENT INTERVENTION;  Surgeon: Corky Crafts, MD;  Location: [facility] INVASIVE CV LAB;  Service: Cardiovascular;  Laterality: N/A;   CORONARY/GRAFT ACUTE MI REVASCULARIZATION N/A 01/30/2019   Procedure: Coronary/Graft Acute MI Revascularization;  Surgeon: Kathleene Hazel, MD;  Location: [facility] INVASIVE CV LAB;  Service: Cardiovascular;  Laterality: N/A;   ESOPHAGOGASTRODUODENOSCOPY (EGD) WITH PROPOFOL N/A 10/13/2019   Non-obstructing Schatzki ring at GE junction, s/p dilation, erosive gastropathy with stigmata of recent bleeding, normal duodenum. Negative H.pylori.    HYSTEROSCOPY     INTRAVASCULAR ULTRASOUND/IVUS N/A 01/16/2020   Procedure: Intravascular Ultrasound/IVUS;  Surgeon: Corky Crafts, MD;  Location: [facility] INVASIVE CV LAB;  Service: Cardiovascular;  Laterality: N/A;   LAPAROSCOPIC APPENDECTOMY N/A 08/03/2020   Procedure: APPENDECTOMY LAPAROSCOPIC;  Surgeon: Campbell Lerner, MD;  Location: [facility] ORS;  Service: General;  Laterality: N/A;   LEFT HEART CATH AND CORONARY ANGIOGRAPHY N/A 01/30/2019   Procedure: LEFT HEART CATH AND CORONARY ANGIOGRAPHY;  Surgeon: Kathleene Hazel, MD;  Location: [facility] INVASIVE CV LAB;  Service: Cardiovascular;  Laterality: N/A;   LEFT HEART CATH AND CORONARY ANGIOGRAPHY N/A 01/16/2020   Procedure: LEFT HEART CATH AND CORONARY ANGIOGRAPHY;  Surgeon: Corky Crafts, MD;  Location: [facility] INVASIVE CV LAB;  Service: Cardiovascular;  Laterality: N/A;   MALONEY DILATION N/A 10/13/2019   Procedure: Elease Hashimoto DILATION;  Surgeon: Corbin Ade, MD;  Location: [facility];  Service: Endoscopy;  Laterality: N/A;   Right mastectomy  1998   [place]   RIGHT/LEFT HEART CATH AND CORONARY ANGIOGRAPHY N/A 02/13/2020   Procedure:  RIGHT/LEFT HEART CATH AND CORONARY ANGIOGRAPHY;  Surgeon: Swazil[place], Peter M, MD;  Location: [facility] INVASIVE CV LAB;  Service: Cardiovascular;  Laterality: N/A;    Current Outpatient Medications  Medication Sig Dispense Refill   acetaminophen (TYLENOL) 500 MG tablet Take 500 mg by mouth every 6 (six) hours as needed for headache (pain).     amiodarone (PACERONE) 200 MG tablet Take 200 mg by mouth daily.     apixaban (ELIQUIS) 2.5 MG TABS tablet TAKE (1) TABLET TWICE DAILY. 60 tablet 5   CALCIUM CARB-CHOLECALCIFEROL PO Take 1 tablet by mouth daily.     carvedilol (COREG) 6.25 MG tablet Take 1 tablet (6.25 mg total) by mouth 2 (two) times daily. 180 tablet 3   cholecalciferol (VITAMIN D3) 25 MCG (1000 UT) tablet Take 1,000 Units by mouth daily with breakfast.     diazepam (VALIUM) 2 MG tablet Take 2 mg by mouth as needed.     docusate sodium (COLACE) 100 MG capsule Take 100 mg by mouth daily as needed for mild constipation.     ferrous sulfate 325 (65 FE) MG tablet Take 325 mg by mouth daily before lunch.     hydrALAZINE (APRESOLINE) 100 MG tablet Take 100 mg by mouth in the morning, at noon, and at bedtime.     isosorbide mononitrate (IMDUR) 120 MG 24 hr tablet TAKE 1 TABLET BY MOUTH AT BEDTIME. 90 tablet 3   loperamide (IMODIUM) 2 MG capsule Take by mouth as needed for diarrhea or loose stools.     meclizine (ANTIVERT) 25 MG tablet Take 25 mg by mouth as needed for dizziness.     Multiple Vitamin (MULTIVITAMIN WITH MINERALS) TABS tablet Take 1 tablet by mouth daily. Centrum Silver for Women     multivitamin-lutein (OCUVITE-LUTEIN) CAPS capsule Take 1 capsule by mouth daily before lunch.      nitroGLYCERIN (NITROSTAT) 0.4 MG SL tablet PLACE 1 TAB UNDER TONGUE EVERY 3 MINUTES AS DIRECTED UP TO 3 TIMES AS NEEDED FOR CHEST PAIN. 25 tablet 0   potassium chloride (KLOR-CON) 10 MEQ tablet Take 10 mEq by mouth daily.     rosuvastatin (CRESTOR) 5 MG tablet TAKE 1/2 TABLET EVERY OTHER DAY. 36 tablet 2    saccharomyces boulardii (FLORASTOR) 250 MG capsule Take 250 mg by mouth as needed.     Simethicone (GAS-X PO) Take by mouth as needed.     sodium chloride (OCEAN) 0.65 % SOLN nasal spray Place 1 spray into both nostrils as needed for congestion.     spironolactone (ALDACTONE) 25 MG tablet Take 12.5 mg by mouth daily.     torsemide (DEMADEX) 20 MG tablet Take 20 mg by mouth daily.     pantoprazole (PROTONIX) 40 MG tablet TAKE 1 TABLET BY MOUTH DAILY--TAKE 30 MINUTES BEFORE BREAKFAST. (Patient not taking: Reported on 07/29/2021) 90 tablet 3   No current facility-administered medications for this visit.   Allergies:  Bactrim [sulfamethoxazole-trimethoprim], Sulfa antibiotics, Amlodipine, Clonidine derivatives, Evista [raloxifene hcl],  Fosamax [alendronate sodium], Glipizide, Lipitor [atorvastatin calcium], Losartan, Pravastatin, and Azithromycin   ROS: No orthopnea or PND.  Physical Exam: VS:  BP (!) 148/70   Pulse (!) 52   Ht 5\' 5"  (1.651 m)   Wt 120 lb (54.4 kg)   SpO2 97%   BMI 19.97 kg/m , BMI Body mass index is 19.97 kg/m.  Wt Readings from Last 3 Encounters:  07/29/21 120 lb (54.4 kg)  07/10/21 118 lb (53.5 kg)  05/02/21 114 lb 3.2 oz (51.8 kg)    General: Patient appears comfortable at rest. HEENT: Conjunctiva and lids normal, oropharynx clear. Neck: Supple, no elevated JVP or carotid bruits, no thyromegaly. Lungs: Clear to auscultation, nonlabored breathing at rest. Cardiac: Regular rate and rhythm, no S3, 2/6 systolic murmur. Extremities: No pitting edema.  ECG:  An ECG dated 12/04/2020 was personally reviewed today and demonstrated:  Sinus bradycardia.  Recent Labwork: 08/02/2020: ALT 30; AST 30 08/10/2020: Magnesium 1.7 06/17/2021: BUN 68; Creatinine, Ser 2.35; Platelets 408; Potassium 4.0; Sodium 135 07/09/2021: Hemoglobin 9.3     Component Value Date/Time   CHOL 155 02/14/2020 0339   TRIG 74 02/14/2020 0339   HDL 57 02/14/2020 0339   CHOLHDL 2.7 02/14/2020 0339    VLDL 15 02/14/2020 0339   LDLCALC 83 02/14/2020 0339    Other Studies Reviewed Today:  Echocardiogram 06/05/2020:  1. Left ventricular ejection fraction, by estimation, is 65 to 70%. The  left ventricle has normal function. The left ventricle has no regional  wall motion abnormalities. There is mild left ventricular hypertrophy.  Left ventricular diastolic parameters  are consistent with Grade II diastolic dysfunction (pseudonormalization).   2. Right ventricular systolic function is normal. The right ventricular  size is normal. There is mildly elevated pulmonary artery systolic  pressure. The estimated right ventricular systolic pressure is 43.8 mmHg.   3. Left atrial size was mildly dilated.   4. Right atrial size was moderately dilated.   5. There is a trivial pericardial effusion posterior to the left  ventricle.   6. The mitral valve is abnormal. Mild to moderate mitral valve  regurgitation.   7. Tricuspid valve regurgitation is moderate.   8. The aortic valve is tricuspid. Aortic valve regurgitation is trivial.  Aortic regurgitation PHT measures 956 msec.   9. The inferior vena cava is normal in size with <50% respiratory  variability, suggesting right atrial pressure of 8 mmHg.   Assessment and Plan:  1.  Paroxysmal atrial fibrillation with CHA2DS2-VASc score of 7.  She has had good rhythm control on amiodarone, continue low-dose Coreg and Eliquis.  She will have lab work with PCP in September.  2.  CAD status post DES to the LAD in January 2021 and DES to the mid RCA in December 2021.  She does not describe any active angina at this time.  Continue Coreg and Lipitor.  3.  CKD stage IV, creatinine 2.35.  She follows with Dr. Wolfgang Phoenix.  4.  HFpEF, clinically stable on current diuretic regimen including Demadex and Aldactone.  Addition of SGLT2 inhibitor such as Marcelline Deist could be considered.  Medication Adjustments/Labs and Tests Ordered: Current medicines are reviewed at  length with the patient today.  Concerns regarding medicines are outlined above.   Tests Ordered: No orders of the defined types were placed in this encounter.   Medication Changes: No orders of the defined types were placed in this encounter.   Disposition:  Follow up  6 months.  Signed, Jonelle Sidle,  MD, [facility] 07/29/2021 11:56 AM    [facility] [address] [place], [place], [place] [address] Phone: [phone]; Fax: [phone]

## 2021-07-29 ENCOUNTER — Encounter: Payer: Self-pay | Admitting: Cardiology

## 2021-07-29 ENCOUNTER — Ambulatory Visit (INDEPENDENT_AMBULATORY_CARE_PROVIDER_SITE_OTHER): Payer: Medicare Other | Admitting: Cardiology

## 2021-07-29 VITALS — BP 148/70 | HR 52 | Ht 65.0 in | Wt 120.0 lb

## 2021-07-29 DIAGNOSIS — I25119 Atherosclerotic heart disease of native coronary artery with unspecified angina pectoris: Secondary | ICD-10-CM

## 2021-07-29 DIAGNOSIS — I5032 Chronic diastolic (congestive) heart failure: Secondary | ICD-10-CM | POA: Diagnosis not present

## 2021-07-29 DIAGNOSIS — I480 Paroxysmal atrial fibrillation: Secondary | ICD-10-CM

## 2021-07-29 DIAGNOSIS — N184 Chronic kidney disease, stage 4 (severe): Secondary | ICD-10-CM

## 2021-07-29 NOTE — Patient Instructions (Signed)
Medication Instructions:  Continue all current medications.   Labwork: none  Testing/Procedures: none  Follow-Up: 6 months   Any Other Special Instructions Will Be Listed Below (If Applicable).   If you need a refill on your cardiac medications before your next appointment, please call your pharmacy.  

## 2021-08-06 ENCOUNTER — Other Ambulatory Visit: Payer: Self-pay | Admitting: Cardiology

## 2021-08-06 ENCOUNTER — Encounter (HOSPITAL_COMMUNITY)
Admission: RE | Admit: 2021-08-06 | Discharge: 2021-08-06 | Disposition: A | Discharge status: Home / Self Care | Payer: Medicare Other | Source: Ambulatory Visit | Attending: Nephrology | Admitting: Nephrology

## 2021-08-06 VITALS — HR 56 | Temp 98.5°F | Resp 16

## 2021-08-06 DIAGNOSIS — D631 Anemia in chronic kidney disease: Secondary | ICD-10-CM | POA: Insufficient documentation

## 2021-08-06 DIAGNOSIS — N179 Acute kidney failure, unspecified: Secondary | ICD-10-CM | POA: Diagnosis not present

## 2021-08-06 DIAGNOSIS — N184 Chronic kidney disease, stage 4 (severe): Secondary | ICD-10-CM | POA: Insufficient documentation

## 2021-08-06 LAB — RENAL FUNCTION PANEL
Albumin: 3.8 g/dL (ref 3.5–5.0)
Anion gap: 10 (ref 5–15)
BUN: 60 mg/dL — ABNORMAL HIGH (ref 8–23)
CO2: 25 mmol/L (ref 22–32)
Calcium: 9 mg/dL (ref 8.9–10.3)
Chloride: 94 mmol/L — ABNORMAL LOW (ref 98–111)
Creatinine, Ser: 2.39 mg/dL — ABNORMAL HIGH (ref 0.44–1.00)
GFR, Estimated: 20 mL/min — ABNORMAL LOW (ref >60)
Glucose, Bld: 94 mg/dL (ref 70–99)
Phosphorus: 4.2 mg/dL (ref 2.5–4.6)
Potassium: 4.3 mmol/L (ref 3.5–5.1)
Sodium: 129 mmol/L — ABNORMAL LOW (ref 135–145)

## 2021-08-06 LAB — PROTEIN / CREATININE RATIO, URINE
Creatinine, Urine: 32.63 mg/dL
Protein Creatinine Ratio: 0.4 mg/mg{Cre} — ABNORMAL HIGH (ref 0.00–0.15)
Total Protein, Urine: 13 mg/dL

## 2021-08-06 LAB — CBC
HCT: 30.2 % — ABNORMAL LOW (ref 36.0–46.0)
Hemoglobin: 10 g/dL — ABNORMAL LOW (ref 12.0–15.0)
MCH: 27.5 pg (ref 26.0–34.0)
MCHC: 33.1 g/dL (ref 30.0–36.0)
MCV: 83 fL (ref 80.0–100.0)
Platelets: 311 10*3/uL (ref 150–400)
RBC: 3.64 MIL/uL — ABNORMAL LOW (ref 3.87–5.11)
RDW: 14.9 % (ref 11.5–15.5)
WBC: 6.1 10*3/uL (ref 4.0–10.5)
nRBC: 0 % (ref 0.0–0.2)

## 2021-08-06 LAB — POCT HEMOGLOBIN-HEMACUE: Hemoglobin: 10.2 g/dL — ABNORMAL LOW (ref 12.0–15.0)

## 2021-08-06 NOTE — Telephone Encounter (Signed)
Prescription refill request for Eliquis received. Indication: PAF Last office visit: 07/29/21  Myles Gip MD Scr: 2.35 on 06/17/21 Age: 87 Weight: 54.4kg  Based on above findings Eliquis 2.'5mg'$  twice daily is the appropriate dose.  Refill approved.

## 2021-08-21 DIAGNOSIS — E1122 Type 2 diabetes mellitus with diabetic chronic kidney disease: Secondary | ICD-10-CM | POA: Diagnosis not present

## 2021-08-21 DIAGNOSIS — R809 Proteinuria, unspecified: Secondary | ICD-10-CM | POA: Diagnosis not present

## 2021-08-21 DIAGNOSIS — E1129 Type 2 diabetes mellitus with other diabetic kidney complication: Secondary | ICD-10-CM | POA: Diagnosis not present

## 2021-08-21 DIAGNOSIS — I5032 Chronic diastolic (congestive) heart failure: Secondary | ICD-10-CM | POA: Diagnosis not present

## 2021-08-21 DIAGNOSIS — I129 Hypertensive chronic kidney disease with stage 1 through stage 4 chronic kidney disease, or unspecified chronic kidney disease: Secondary | ICD-10-CM | POA: Diagnosis not present

## 2021-08-21 DIAGNOSIS — N189 Chronic kidney disease, unspecified: Secondary | ICD-10-CM | POA: Diagnosis not present

## 2021-08-21 DIAGNOSIS — D638 Anemia in other chronic diseases classified elsewhere: Secondary | ICD-10-CM | POA: Diagnosis not present

## 2021-09-04 DIAGNOSIS — I160 Hypertensive urgency: Secondary | ICD-10-CM | POA: Diagnosis not present

## 2021-09-04 DIAGNOSIS — N189 Chronic kidney disease, unspecified: Secondary | ICD-10-CM | POA: Diagnosis not present

## 2021-09-04 DIAGNOSIS — E1122 Type 2 diabetes mellitus with diabetic chronic kidney disease: Secondary | ICD-10-CM | POA: Diagnosis not present

## 2021-09-04 DIAGNOSIS — I5032 Chronic diastolic (congestive) heart failure: Secondary | ICD-10-CM | POA: Diagnosis not present

## 2021-09-04 DIAGNOSIS — R809 Proteinuria, unspecified: Secondary | ICD-10-CM | POA: Diagnosis not present

## 2021-09-04 DIAGNOSIS — E1129 Type 2 diabetes mellitus with other diabetic kidney complication: Secondary | ICD-10-CM | POA: Diagnosis not present

## 2021-09-10 ENCOUNTER — Encounter (HOSPITAL_COMMUNITY)
Admission: RE | Admit: 2021-09-10 | Discharge: 2021-09-10 | Disposition: A | Discharge status: Home / Self Care | Payer: Medicare Other | Source: Ambulatory Visit | Attending: Nephrology | Admitting: Nephrology

## 2021-09-10 ENCOUNTER — Encounter (HOSPITAL_COMMUNITY): Payer: Self-pay

## 2021-09-10 VITALS — BP 171/40 | HR 48 | Temp 97.6°F | Resp 18

## 2021-09-10 DIAGNOSIS — N184 Chronic kidney disease, stage 4 (severe): Secondary | ICD-10-CM | POA: Diagnosis not present

## 2021-09-10 DIAGNOSIS — N170 Acute kidney failure with tubular necrosis: Secondary | ICD-10-CM | POA: Diagnosis not present

## 2021-09-10 LAB — POCT HEMOGLOBIN-HEMACUE: Hemoglobin: 10 g/dL — ABNORMAL LOW (ref 12.0–15.0)

## 2021-09-10 MED ORDER — EPOETIN ALFA-EPBX 3000 UNIT/ML IJ SOLN: 3000.0000 [IU] | INTRAMUSCULAR | Status: DC

## 2021-09-28 ENCOUNTER — Other Ambulatory Visit: Payer: Self-pay

## 2021-09-28 ENCOUNTER — Inpatient Hospital Stay (HOSPITAL_COMMUNITY)
Admission: EM | Admit: 2021-09-28 | Discharge: 2021-10-03 | DRG: 305 | Disposition: A | Discharge status: Home Health | Payer: Medicare Other | Attending: Internal Medicine | Admitting: Internal Medicine

## 2021-09-28 ENCOUNTER — Emergency Department (HOSPITAL_COMMUNITY): Payer: Medicare Other

## 2021-09-28 ENCOUNTER — Encounter (HOSPITAL_COMMUNITY): Payer: Self-pay

## 2021-09-28 DIAGNOSIS — Z66 Do not resuscitate: Secondary | ICD-10-CM | POA: Diagnosis present

## 2021-09-28 DIAGNOSIS — R9431 Abnormal electrocardiogram [ECG] [EKG]: Secondary | ICD-10-CM | POA: Diagnosis not present

## 2021-09-28 DIAGNOSIS — R001 Bradycardia, unspecified: Secondary | ICD-10-CM | POA: Diagnosis present

## 2021-09-28 DIAGNOSIS — Z955 Presence of coronary angioplasty implant and graft: Secondary | ICD-10-CM

## 2021-09-28 DIAGNOSIS — I5022 Chronic systolic (congestive) heart failure: Secondary | ICD-10-CM | POA: Diagnosis present

## 2021-09-28 DIAGNOSIS — M109 Gout, unspecified: Secondary | ICD-10-CM | POA: Diagnosis not present

## 2021-09-28 DIAGNOSIS — E1122 Type 2 diabetes mellitus with diabetic chronic kidney disease: Secondary | ICD-10-CM | POA: Diagnosis present

## 2021-09-28 DIAGNOSIS — E878 Other disorders of electrolyte and fluid balance, not elsewhere classified: Secondary | ICD-10-CM | POA: Diagnosis present

## 2021-09-28 DIAGNOSIS — I169 Hypertensive crisis, unspecified: Secondary | ICD-10-CM | POA: Diagnosis present

## 2021-09-28 DIAGNOSIS — E119 Type 2 diabetes mellitus without complications: Secondary | ICD-10-CM

## 2021-09-28 DIAGNOSIS — M8580 Other specified disorders of bone density and structure, unspecified site: Secondary | ICD-10-CM | POA: Diagnosis present

## 2021-09-28 DIAGNOSIS — Z853 Personal history of malignant neoplasm of breast: Secondary | ICD-10-CM

## 2021-09-28 DIAGNOSIS — I10 Essential (primary) hypertension: Secondary | ICD-10-CM | POA: Diagnosis not present

## 2021-09-28 DIAGNOSIS — E875 Hyperkalemia: Secondary | ICD-10-CM | POA: Diagnosis present

## 2021-09-28 DIAGNOSIS — D631 Anemia in chronic kidney disease: Secondary | ICD-10-CM | POA: Diagnosis present

## 2021-09-28 DIAGNOSIS — E782 Mixed hyperlipidemia: Secondary | ICD-10-CM | POA: Diagnosis present

## 2021-09-28 DIAGNOSIS — Z887 Allergy status to serum and vaccine status: Secondary | ICD-10-CM

## 2021-09-28 DIAGNOSIS — E871 Hypo-osmolality and hyponatremia: Secondary | ICD-10-CM | POA: Diagnosis present

## 2021-09-28 DIAGNOSIS — Z9011 Acquired absence of right breast and nipple: Secondary | ICD-10-CM

## 2021-09-28 DIAGNOSIS — Z7901 Long term (current) use of anticoagulants: Secondary | ICD-10-CM | POA: Diagnosis not present

## 2021-09-28 DIAGNOSIS — Z881 Allergy status to other antibiotic agents status: Secondary | ICD-10-CM

## 2021-09-28 DIAGNOSIS — I480 Paroxysmal atrial fibrillation: Secondary | ICD-10-CM | POA: Diagnosis not present

## 2021-09-28 DIAGNOSIS — R112 Nausea with vomiting, unspecified: Secondary | ICD-10-CM | POA: Diagnosis present

## 2021-09-28 DIAGNOSIS — I130 Hypertensive heart and chronic kidney disease with heart failure and stage 1 through stage 4 chronic kidney disease, or unspecified chronic kidney disease: Secondary | ICD-10-CM | POA: Diagnosis not present

## 2021-09-28 DIAGNOSIS — N184 Chronic kidney disease, stage 4 (severe): Secondary | ICD-10-CM | POA: Diagnosis present

## 2021-09-28 DIAGNOSIS — D649 Anemia, unspecified: Secondary | ICD-10-CM | POA: Diagnosis present

## 2021-09-28 DIAGNOSIS — J449 Chronic obstructive pulmonary disease, unspecified: Secondary | ICD-10-CM | POA: Diagnosis present

## 2021-09-28 DIAGNOSIS — I252 Old myocardial infarction: Secondary | ICD-10-CM

## 2021-09-28 DIAGNOSIS — Z8249 Family history of ischemic heart disease and other diseases of the circulatory system: Secondary | ICD-10-CM

## 2021-09-28 DIAGNOSIS — Z85828 Personal history of other malignant neoplasm of skin: Secondary | ICD-10-CM | POA: Diagnosis not present

## 2021-09-28 DIAGNOSIS — I25110 Atherosclerotic heart disease of native coronary artery with unstable angina pectoris: Secondary | ICD-10-CM | POA: Diagnosis present

## 2021-09-28 DIAGNOSIS — K219 Gastro-esophageal reflux disease without esophagitis: Secondary | ICD-10-CM | POA: Diagnosis present

## 2021-09-28 DIAGNOSIS — Z888 Allergy status to other drugs, medicaments and biological substances status: Secondary | ICD-10-CM

## 2021-09-28 DIAGNOSIS — I200 Unstable angina: Secondary | ICD-10-CM | POA: Diagnosis not present

## 2021-09-28 DIAGNOSIS — R0602 Shortness of breath: Secondary | ICD-10-CM | POA: Diagnosis not present

## 2021-09-28 DIAGNOSIS — T502X5A Adverse effect of carbonic-anhydrase inhibitors, benzothiadiazides and other diuretics, initial encounter: Secondary | ICD-10-CM | POA: Diagnosis present

## 2021-09-28 DIAGNOSIS — Z7189 Other specified counseling: Secondary | ICD-10-CM | POA: Diagnosis not present

## 2021-09-28 DIAGNOSIS — Z79899 Other long term (current) drug therapy: Secondary | ICD-10-CM

## 2021-09-28 DIAGNOSIS — I2510 Atherosclerotic heart disease of native coronary artery without angina pectoris: Secondary | ICD-10-CM | POA: Diagnosis not present

## 2021-09-28 DIAGNOSIS — I2583 Coronary atherosclerosis due to lipid rich plaque: Secondary | ICD-10-CM | POA: Diagnosis not present

## 2021-09-28 HISTORY — DX: Non-ST elevation (NSTEMI) myocardial infarction: I21.4

## 2021-09-28 LAB — CBC WITH DIFFERENTIAL/PLATELET
Abs Immature Granulocytes: 0.04 10*3/uL (ref 0.00–0.07)
Basophils Absolute: 0 10*3/uL (ref 0.0–0.1)
Basophils Relative: 0 %
Eosinophils Absolute: 0.1 10*3/uL (ref 0.0–0.5)
Eosinophils Relative: 2 %
HCT: 29.2 % — ABNORMAL LOW (ref 36.0–46.0)
Hemoglobin: 10 g/dL — ABNORMAL LOW (ref 12.0–15.0)
Immature Granulocytes: 1 %
Lymphocytes Relative: 21 %
Lymphs Abs: 1.4 10*3/uL (ref 0.7–4.0)
MCH: 27.8 pg (ref 26.0–34.0)
MCHC: 34.2 g/dL (ref 30.0–36.0)
MCV: 81.1 fL (ref 80.0–100.0)
Monocytes Absolute: 0.8 10*3/uL (ref 0.1–1.0)
Monocytes Relative: 12 %
Neutro Abs: 4.4 10*3/uL (ref 1.7–7.7)
Neutrophils Relative %: 64 %
Platelets: 295 10*3/uL (ref 150–400)
RBC: 3.6 MIL/uL — ABNORMAL LOW (ref 3.87–5.11)
RDW: 14.9 % (ref 11.5–15.5)
WBC: 6.7 10*3/uL (ref 4.0–10.5)
nRBC: 0 % (ref 0.0–0.2)

## 2021-09-28 LAB — CBG MONITORING, ED: Glucose-Capillary: 144 mg/dL — ABNORMAL HIGH (ref 70–99)

## 2021-09-28 LAB — COMPREHENSIVE METABOLIC PANEL
ALT: 35 U/L (ref 0–44)
AST: 39 U/L (ref 15–41)
Albumin: 3.9 g/dL (ref 3.5–5.0)
Alkaline Phosphatase: 64 U/L (ref 38–126)
Anion gap: 10 (ref 5–15)
BUN: 81 mg/dL — ABNORMAL HIGH (ref 8–23)
CO2: 23 mmol/L (ref 22–32)
Calcium: 9.1 mg/dL (ref 8.9–10.3)
Chloride: 95 mmol/L — ABNORMAL LOW (ref 98–111)
Creatinine, Ser: 2.71 mg/dL — ABNORMAL HIGH (ref 0.44–1.00)
GFR, Estimated: 17 mL/min — ABNORMAL LOW (ref >60)
Glucose, Bld: 160 mg/dL — ABNORMAL HIGH (ref 70–99)
Potassium: 4.1 mmol/L (ref 3.5–5.1)
Sodium: 128 mmol/L — ABNORMAL LOW (ref 135–145)
Total Bilirubin: 0.9 mg/dL (ref 0.3–1.2)
Total Protein: 6.8 g/dL (ref 6.5–8.1)

## 2021-09-28 LAB — PROTIME-INR
INR: 1.2 (ref 0.8–1.2)
Prothrombin Time: 15.1 seconds (ref 11.4–15.2)

## 2021-09-28 LAB — TROPONIN I (HIGH SENSITIVITY)
Troponin I (High Sensitivity): 21 ng/L — ABNORMAL HIGH (ref <18)
Troponin I (High Sensitivity): 22 ng/L — ABNORMAL HIGH (ref <18)

## 2021-09-28 LAB — BRAIN NATRIURETIC PEPTIDE: B Natriuretic Peptide: 591 pg/mL — ABNORMAL HIGH (ref 0.0–100.0)

## 2021-09-28 MED ORDER — ACETAMINOPHEN 325 MG PO TABS
650.0000 mg | ORAL_TABLET | Freq: Four times a day (QID) | ORAL | Status: DC | PRN
  Administered 2021-09-30 – 2021-10-01 (×2): 650 mg via ORAL
  Filled 2021-09-28 (×4): qty 2

## 2021-09-28 MED ORDER — OCUVITE-LUTEIN PO CAPS
1.0000 | ORAL_CAPSULE | Freq: Every day | ORAL | Status: DC
  Administered 2021-09-29 – 2021-10-02 (×4): 1 via ORAL
  Filled 2021-09-28 (×5): qty 1

## 2021-09-28 MED ORDER — BISACODYL 10 MG RE SUPP: 10.0000 mg | Freq: Every day | RECTAL | Status: DC | PRN

## 2021-09-28 MED ORDER — SENNOSIDES-DOCUSATE SODIUM 8.6-50 MG PO TABS
2.0000 | ORAL_TABLET | Freq: Every day | ORAL | Status: DC
  Administered 2021-09-28 – 2021-10-02 (×5): 2 via ORAL
  Filled 2021-09-28 (×5): qty 2

## 2021-09-28 MED ORDER — AMLODIPINE BESYLATE 5 MG PO TABS
10.0000 mg | ORAL_TABLET | Freq: Every day | ORAL | Status: DC
  Administered 2021-09-28 – 2021-10-03 (×6): 10 mg via ORAL
  Filled 2021-09-28 (×6): qty 2

## 2021-09-28 MED ORDER — SODIUM CHLORIDE 0.9 % IV SOLN: INTRAVENOUS | Status: DC | PRN

## 2021-09-28 MED ORDER — HYDRALAZINE HCL 20 MG/ML IJ SOLN
20.0000 mg | Freq: Once | INTRAMUSCULAR | Status: AC
  Administered 2021-09-28: 20 mg via INTRAVENOUS
  Filled 2021-09-28: qty 1

## 2021-09-28 MED ORDER — SPIRONOLACTONE 12.5 MG HALF TABLET: 12.5000 mg | ORAL_TABLET | Freq: Every day | ORAL | Status: DC

## 2021-09-28 MED ORDER — LOPERAMIDE HCL 2 MG PO CAPS: 2.0000 mg | ORAL_CAPSULE | ORAL | Status: DC | PRN

## 2021-09-28 MED ORDER — SODIUM CHLORIDE 0.9 % IV SOLN: INTRAVENOUS | Status: AC

## 2021-09-28 MED ORDER — ACETAMINOPHEN 650 MG RE SUPP: 650.0000 mg | Freq: Four times a day (QID) | RECTAL | Status: DC | PRN

## 2021-09-28 MED ORDER — ONDANSETRON HCL 4 MG/2ML IJ SOLN
4.0000 mg | Freq: Four times a day (QID) | INTRAMUSCULAR | Status: DC | PRN
  Administered 2021-09-29 – 2021-10-01 (×3): 4 mg via INTRAVENOUS
  Filled 2021-09-28 (×3): qty 2

## 2021-09-28 MED ORDER — LORAZEPAM 2 MG/ML IJ SOLN
0.5000 mg | Freq: Once | INTRAMUSCULAR | Status: AC
  Administered 2021-09-28: 0.5 mg via INTRAVENOUS
  Filled 2021-09-28: qty 1

## 2021-09-28 MED ORDER — APIXABAN 5 MG PO TABS
5.0000 mg | ORAL_TABLET | Freq: Two times a day (BID) | ORAL | Status: DC
  Administered 2021-09-28: 5 mg via ORAL
  Filled 2021-09-28: qty 1

## 2021-09-28 MED ORDER — NITROGLYCERIN IN D5W 200-5 MCG/ML-% IV SOLN
0.0000 ug/min | INTRAVENOUS | Status: DC
  Administered 2021-09-28: 5 ug/min via INTRAVENOUS
  Filled 2021-09-28 (×2): qty 250

## 2021-09-28 MED ORDER — CARVEDILOL 3.125 MG PO TABS
6.2500 mg | ORAL_TABLET | Freq: Two times a day (BID) | ORAL | Status: DC
  Administered 2021-09-28: 6.25 mg via ORAL
  Filled 2021-09-28: qty 2

## 2021-09-28 MED ORDER — HYDRALAZINE HCL 25 MG PO TABS
100.0000 mg | ORAL_TABLET | Freq: Three times a day (TID) | ORAL | Status: DC
  Administered 2021-09-28 – 2021-10-03 (×14): 100 mg via ORAL
  Filled 2021-09-28 (×14): qty 4

## 2021-09-28 MED ORDER — HYDRALAZINE HCL 20 MG/ML IJ SOLN
20.0000 mg | INTRAMUSCULAR | Status: DC | PRN
  Administered 2021-09-28 – 2021-09-29 (×2): 20 mg via INTRAVENOUS
  Filled 2021-09-28 (×3): qty 1

## 2021-09-28 MED ORDER — ONDANSETRON HCL 4 MG PO TABS
4.0000 mg | ORAL_TABLET | Freq: Four times a day (QID) | ORAL | Status: DC | PRN
  Administered 2021-10-01: 4 mg via ORAL
  Filled 2021-09-28 (×2): qty 1

## 2021-09-28 MED ORDER — SODIUM CHLORIDE 0.9% FLUSH: 3.0000 mL | INTRAVENOUS | Status: DC | PRN

## 2021-09-28 MED ORDER — DIAZEPAM 2 MG PO TABS
2.0000 mg | ORAL_TABLET | Freq: Two times a day (BID) | ORAL | Status: DC | PRN
  Filled 2021-09-28: qty 1

## 2021-09-28 MED ORDER — POLYETHYLENE GLYCOL 3350 17 G PO PACK
17.0000 g | PACK | Freq: Every day | ORAL | Status: DC | PRN
Start: 2021-09-28 — End: 2021-10-03

## 2021-09-28 MED ORDER — SODIUM CHLORIDE 0.9% FLUSH
3.0000 mL | Freq: Two times a day (BID) | INTRAVENOUS | Status: DC
Start: 2021-09-28 — End: 2021-10-03
  Administered 2021-09-28 – 2021-10-03 (×10): 3 mL via INTRAVENOUS

## 2021-09-28 MED ORDER — TORSEMIDE 20 MG PO TABS
20.0000 mg | ORAL_TABLET | Freq: Every day | ORAL | Status: DC
  Administered 2021-09-29 – 2021-10-03 (×5): 20 mg via ORAL
  Filled 2021-09-28 (×5): qty 1

## 2021-09-28 MED ORDER — SODIUM CHLORIDE 0.9 % IV SOLN: INTRAVENOUS | Status: DC

## 2021-09-28 MED ORDER — SODIUM CHLORIDE 0.9% FLUSH
3.0000 mL | Freq: Two times a day (BID) | INTRAVENOUS | Status: DC
  Administered 2021-09-28 – 2021-10-03 (×10): 3 mL via INTRAVENOUS

## 2021-09-28 MED ORDER — ISOSORBIDE MONONITRATE ER 60 MG PO TB24
120.0000 mg | ORAL_TABLET | Freq: Every day | ORAL | Status: DC
  Administered 2021-09-29 – 2021-10-02 (×4): 120 mg via ORAL
  Filled 2021-09-28 (×4): qty 2

## 2021-09-28 MED ORDER — FERROUS SULFATE 325 (65 FE) MG PO TABS
325.0000 mg | ORAL_TABLET | Freq: Every day | ORAL | Status: DC
  Administered 2021-09-29 – 2021-10-02 (×4): 325 mg via ORAL
  Filled 2021-09-28 (×5): qty 1

## 2021-09-28 MED ORDER — TRAZODONE HCL 50 MG PO TABS
50.0000 mg | ORAL_TABLET | Freq: Every evening | ORAL | Status: DC | PRN
  Administered 2021-09-29 – 2021-10-02 (×4): 50 mg via ORAL
  Filled 2021-09-28 (×5): qty 1

## 2021-09-28 MED ORDER — AMIODARONE HCL 200 MG PO TABS
200.0000 mg | ORAL_TABLET | Freq: Every day | ORAL | Status: DC
  Administered 2021-09-29 – 2021-10-03 (×5): 200 mg via ORAL
  Filled 2021-09-28 (×5): qty 1

## 2021-09-28 MED ORDER — ASPIRIN 81 MG PO CHEW
324.0000 mg | CHEWABLE_TABLET | Freq: Once | ORAL | Status: AC
  Administered 2021-09-28: 324 mg via ORAL
  Filled 2021-09-28: qty 4

## 2021-09-28 NOTE — ED Notes (Signed)
Pt's BP consistently cycling in the 200s/50s--pt is currently at 130mg/min of nitroglycerin and has received the ordered hydralazine--MD made aware

## 2021-09-28 NOTE — ED Provider Notes (Addendum)
[facility] EMERGENCY DEPARTMENT Provider Note   CSN: [id number] Arrival date & time: 09/28/21  1443     History  Chief Complaint  Patient presents with   Shortness of [place] is a 87 y.o. female.   Shortness of Breath  This patient is a very pleasant 87 year old female with a known history of atrial fibrillation on amiodarone and Eliquis, she has hypertension taking hydralazine, carvedilol as well as diuretics including both spironolactone and torsemide.  She has a history of multiple cardiac obstructive events, the last heart catheterization was in January 2022, at that time it showed nonobstructive coronary disease with patency of stents in the LAD and the RCA.  Normal cardiac output.  Echocardiogram in May 2022 showed ejection fraction of 65 to 70% with grade 2 diastolic dysfunction.  The patient reports to me that she is now having exertional symptoms, over the last couple of months she has had progressive exertional shortness of breath with the pain across her shoulder blades but this seems to be uncommon over the last couple weeks has become more common and today it has been extremely bad requiring her to sit down for the symptoms to go away.  She has not had any coughing or fevers or swelling of her legs and last night she did have a little bit of shortness of breath that caused her to not sleep very well.  She has been taking her medications exactly as prescribed, last dose of Eliquis was this morning, last dose of hydralazine was at 2:00 in the afternoon.  On arrival the patient is severely hypertensive at 220/55, pulse in the 50s      Home Medications Prior to Admission medications   Medication Sig Start Date End Date Taking? Authorizing Provider  acetaminophen (TYLENOL) 500 MG tablet Take 500 mg by mouth every 6 (six) hours as needed for headache (pain).   Yes [provider]  amiodarone (PACERONE) 200 MG tablet Take 200 mg by mouth daily.   Yes  [provider]  apixaban (ELIQUIS) 2.5 MG TABS tablet TAKE (1) TABLET TWICE DAILY. Patient taking differently: Take 5 mg by mouth 2 (two) times daily. 08/06/21  Yes Satira Sark, MD  CALCIUM CARB-CHOLECALCIFEROL PO Take 1 tablet by mouth daily.   Yes [provider]  carvedilol (COREG) 6.25 MG tablet Take 1 tablet (6.25 mg total) by mouth 2 (two) times daily. 03/13/21  Yes Satira Sark, MD  cholecalciferol (VITAMIN D3) 25 MCG (1000 UT) tablet Take 1,000 Units by mouth daily with breakfast.   Yes [provider]  diazepam (VALIUM) 2 MG tablet Take 2 mg by mouth 2 (two) times daily as needed for anxiety.   Yes [provider]  docusate sodium (COLACE) 100 MG capsule Take 100 mg by mouth daily as needed for mild constipation.   Yes [provider]  ferrous sulfate 325 (65 FE) MG tablet Take 325 mg by mouth daily before lunch.   Yes [provider]  hydrALAZINE (APRESOLINE) 100 MG tablet Take 100 mg by mouth in the morning, at noon, and at bedtime. 10/10/20 10/10/21 Yes [provider]  isosorbide mononitrate (IMDUR) 120 MG 24 hr tablet TAKE 1 TABLET BY MOUTH AT BEDTIME. Patient taking differently: Take 120 mg by mouth daily. 02/06/21  Yes Satira Sark, MD  loperamide (IMODIUM) 2 MG capsule Take 2 mg by mouth as needed for diarrhea or loose stools.   Yes [provider]  meclizine (ANTIVERT) 25 MG tablet Take 25 mg by mouth as needed for dizziness.   Yes [provider]  Multiple Vitamin (MULTIVITAMIN WITH MINERALS) TABS tablet Take 1 tablet by mouth daily. Centrum Silver for Women   Yes [provider]  multivitamin-lutein (OCUVITE-LUTEIN) CAPS capsule Take 1 capsule by mouth daily before lunch.    Yes [provider]  nitroGLYCERIN (NITROSTAT) 0.4 MG SL tablet PLACE 1 TAB UNDER TONGUE EVERY 3 MINUTES AS DIRECTED UP TO 3 TIMES AS NEEDED FOR CHEST PAIN. Patient taking differently: Place 0.4  mg under the tongue every 5 (five) minutes as needed for chest pain. 01/07/21  Yes Reino Bellis B, NP  potassium chloride (KLOR-CON) 10 MEQ tablet Take 10 mEq by mouth daily. 11/22/20  Yes [provider]  saccharomyces boulardii (FLORASTOR) 250 MG capsule Take 250 mg by mouth as needed (stomach issues).   Yes [provider]  Simethicone (GAS-X PO) Take 1 capsule by mouth daily as needed (gas).   Yes [provider]  spironolactone (ALDACTONE) 25 MG tablet Take 12.5 mg by mouth daily.   Yes [provider]  torsemide (DEMADEX) 20 MG tablet Take 20 mg by mouth daily.   Yes [provider]  sodium chloride (OCEAN) 0.65 % SOLN nasal spray Place 1 spray into both nostrils as needed for congestion.    [provider]      Allergies    Bactrim [sulfamethoxazole-trimethoprim], Sulfa antibiotics, Amlodipine, Clonidine derivatives, Evista [raloxifene hcl], Fosamax [alendronate sodium], Glipizide, Lipitor [atorvastatin calcium], Losartan, Pravastatin, and Azithromycin    Review of Systems   Review of Systems  Respiratory:  Positive for shortness of breath.   All other systems reviewed and are negative.   Physical Exam Updated Vital Signs BP (!) 182/92 (BP Location: Left Arm)   Pulse (!) 59   Temp 97.7 F (36.5 C) (Oral)   Resp 20   Ht 1.676 m ('5\' 6"'$ )   Wt 52.2 kg   SpO2 98%   BMI 18.56 kg/m  Physical Exam Vitals and nursing note reviewed.  Constitutional:      General: She is not in acute distress.    Appearance: She is well-developed.  HENT:     Head: Normocephalic and atraumatic.     Mouth/Throat:     Pharynx: No oropharyngeal exudate.  Eyes:     General: No scleral icterus.       Right eye: No discharge.        Left eye: No discharge.     Conjunctiva/sclera: Conjunctivae normal.     Pupils: Pupils are equal, round, and reactive to light.  Neck:     Thyroid: No thyromegaly.     Vascular: No JVD.  Cardiovascular:      Rate and Rhythm: Normal rate and regular rhythm.     Heart sounds: Normal heart sounds. No murmur heard.    No friction rub. No gallop.  Pulmonary:     Effort: Pulmonary effort is normal. No respiratory distress.     Breath sounds: Normal breath sounds. No wheezing or rales.  Abdominal:     General: Bowel sounds are normal. There is no distension.     Palpations: Abdomen is soft. There is no mass.     Tenderness: There is no abdominal tenderness.  Musculoskeletal:        General: No tenderness. Normal range of motion.     Cervical back: Normal range of motion and neck supple.     Right lower  leg: No tenderness.     Left lower leg: No tenderness.  Lymphadenopathy:     Cervical: No cervical adenopathy.  Skin:    General: Skin is warm and dry.     Findings: No erythema or rash.  Neurological:     Mental Status: She is alert.     Coordination: Coordination normal.  Psychiatric:        Behavior: Behavior normal.     ED Results / Procedures / Treatments   Labs (all labs ordered are listed, but only abnormal results are displayed) Labs Reviewed  COMPREHENSIVE METABOLIC PANEL - Abnormal; Notable for the following components:      Result Value   Sodium 128 (*)    Chloride 95 (*)    Glucose, Bld 160 (*)    BUN 81 (*)    Creatinine, Ser 2.71 (*)    GFR, Estimated 17 (*)    All other components within normal limits  CBC WITH DIFFERENTIAL/PLATELET - Abnormal; Notable for the following components:   RBC 3.60 (*)    Hemoglobin 10.0 (*)    HCT 29.2 (*)    All other components within normal limits  BRAIN NATRIURETIC PEPTIDE - Abnormal; Notable for the following components:   B Natriuretic Peptide 591.0 (*)    All other components within normal limits  CBG MONITORING, ED - Abnormal; Notable for the following components:   Glucose-Capillary 144 (*)    All other components within normal limits  TROPONIN I (HIGH SENSITIVITY) - Abnormal; Notable for the following components:   Troponin  I (High Sensitivity) 21 (*)    All other components within normal limits  TROPONIN I (HIGH SENSITIVITY) - Abnormal; Notable for the following components:   Troponin I (High Sensitivity) 22 (*)    All other components within normal limits  PROTIME-INR    EKG EKG Interpretation  Date/Time:  Saturday September 28 2021 14:55:55 EDT Ventricular Rate:  53 PR Interval:  176 QRS Duration: 96 QT Interval:  478 QTC Calculation: 448 R Axis:   -1 Text Interpretation: Sinus bradycardia Possible Left atrial enlargement Possible Anterior infarct , age undetermined Abnormal ECG When compared with ECG of 24-Feb-2020 21:22, Sinus rhythm has replaced Atrial fibrillation Vent. rate has decreased BY  62 BPM Nonspecific T wave abnormality no longer evident in Inferior leads Nonspecific T wave abnormality no longer evident in Anterolateral leads Confirmed by Noemi Chapel [phone]) on 09/28/2021 3:07:11 PM  Radiology DG Chest Portable 1 View  Result Date: 09/28/2021 CLINICAL DATA:  Shortness of breath EXAM: PORTABLE CHEST 1 VIEW COMPARISON:  Previous studies including the examination of 07/02/2021 FINDINGS: Transverse diameter of heart is increased. Low position of diaphragms suggests COPD. There are no signs of pulmonary edema or focal pulmonary consolidation. Coronary artery stent is noted overlying the left side of heart. There is no pleural effusion or pneumothorax. Surgical clips are seen in right chest wall. IMPRESSION: Cardiomegaly. COPD. Coronary artery disease. There are no signs of pulmonary edema or focal pulmonary consolidation. Electronically Signed   By: Elmer Picker M.D.   On: 09/28/2021 15:47    Procedures .Critical Care  Performed by: Noemi Chapel, MD Authorized by: Noemi Chapel, MD   Critical care provider statement:    Critical care time (minutes):  30   Critical care time was exclusive of:  Teaching time   Critical care was necessary to treat or prevent imminent or life-threatening  deterioration of the following conditions: Hypertensive emergency.   Critical care was time spent  personally by me on the following activities:  Development of treatment plan with patient or surrogate, discussions with consultants, evaluation of patient's response to treatment, examination of patient, ordering and review of laboratory studies, ordering and review of radiographic studies, ordering and performing treatments and interventions, pulse oximetry, re-evaluation of patient's condition, review of old charts and obtaining history from patient or surrogate   I assumed direction of critical care for this patient from another provider in my specialty: no     Care discussed with: admitting provider   Comments:           Medications Ordered in ED Medications  0.9 %  sodium chloride infusion (has no administration in time range)  nitroGLYCERIN 50 mg in dextrose 5 % 250 mL (0.2 mg/mL) infusion (105 mcg/min Intravenous Infusion Verify 09/28/21 1833)  aspirin chewable tablet 324 mg (324 mg Oral Given 09/28/21 1528)  hydrALAZINE (APRESOLINE) injection 20 mg (20 mg Intravenous Given 09/28/21 1741)    ED Course/ Medical Decision Making/ A&P                           Medical Decision Making Amount and/or Complexity of Data Reviewed Labs: ordered. Radiology: ordered.  Risk OTC drugs. Prescription drug management. Decision regarding hospitalization.   This patient presents to the ED for concern of exertional dyspnea, this involves an extensive number of treatment options, and is a complaint that carries with it a high risk of complications and morbidity.  The differential diagnosis includes pulmonary embolism seems less likely given that she is on Eliquis, consider pneumonia or pneumothorax but has no coughing or significant chest pain.  Congestive heart failure certainly an option given that she has grade 2 diastolic dysfunction and most importantly unstable angina with exertional symptoms that are  becoming more frequent is at the top of the differential.  On top of that her blood pressure is severely hypertensive today.   Co morbidities that complicate the patient evaluation  History of congestive heart failure as well as coronary disease   Additional history obtained:  Additional history obtained from electronic medical record External records from outside source obtained and reviewed including catheterization and echocardiogram   Lab Tests:  I Ordered, and personally interpreted labs.  The pertinent results include: CBC metabolic panel and troponin, the troponin was barely above 20, the creatinine is just above baseline at 2.7, baseline seems to be closer to 2.4, CBC unremarkable.   Imaging Studies ordered:  I ordered imaging studies including chest x-ray I independently visualized and interpreted imaging which showed no acute findings, cardiomegaly present I agree with the radiologist interpretation   Cardiac Monitoring: / EKG:  The patient was maintained on a cardiac monitor.  I personally viewed and interpreted the cardiac monitored which showed an underlying rhythm of: Normal sinus rhythm, mild sinus bradycardia, no other arrhythmias   Consultations Obtained:  I requested consultation with the cardiologist, they recommend admission for blood pressure control, the patient can stay here as long as the troponins stay flat, she is not a heart cath candidate as this is more likely related to her underlying blood pressure,  and discussed lab and imaging findings as well as pertinent plan - they recommend: Discussing with hospitalist for admission Will discuss with hospitalist to admit   Problem List / ED Course / Critical interventions / Medication management  The patient required nitroglycerin drip as well as hydralazine for severe uncontrolled hypertension I ordered medication including  nitroglycerin and hydralazine for hypertension Reevaluation of the patient after  these medicines showed that the patient improved but critically ill with severe hypertension and dyspnea I have reviewed the patients home medicines and have made adjustments as needed   Social Determinants of Health:  Severe uncontrolled hypertension   Test / Admission - Considered:  Discussed with Dr. Denton Brick who will admit the patient to hospital         Final Clinical Impression(s) / ED Diagnoses Final diagnoses:  Unstable angina (Accident)  Severe hypertension    Rx / DC Orders ED Discharge Orders     None         Noemi Chapel, MD 09/28/21 Kristian Covey    Noemi Chapel, MD 09/28/21 Bosie Helper

## 2021-09-28 NOTE — ED Triage Notes (Signed)
Pt to er, pt states that she has a hx of two heart attacks and some chf, states she is here for some should blade pain and shortness of breath, states that it has been getting worse over the past few days, states that it is getting harder to breath.

## 2021-09-28 NOTE — ED Notes (Signed)
Per Noemi Chapel, MD, verbal order to titrate nitroglycerin drip by 22mg/min.

## 2021-09-28 NOTE — H&P (Signed)
Patient Demographics:    Crystal [name], is a 87 y.o. female  MRN: [id number]   DOB - [birth year]  Admit Date - 09/28/2021  Outpatient Primary MD for the patient is Glenda Chroman, MD   Assessment & Plan:   Assessment and Plan:  1)Hypertensive Urgency----Systolic blood pressures were persistently over 200 in the ED..  Diastolic blood pressure was mostly in the 50s and 60s - patient has shortness of breath and pain around the scapular area -Patient denies frank chest pains --No headaches or palpitations or dizziness -Patient reports complete compliance with antihypertensives prior to admission... Last dose of antihypertensives was 2 PM today -started patient on IV nitro drip after she failed to respond to bolus doses of IV hydralazine -Patient has baseline bradycardia limiting shortness of antihypertensives -We will add oral amlodipine -Get echocardiogram -If BP remains elevated especially if scapula pain persist or worsens consider CTA chest to rule out aortic pathology--- patient renal function makes it difficult to do a contrast study at this time  2)H/o CAD--patient with history of prior STEMI status post prior angioplasty and stent placement into mid LAD and mid RCA -denies frank chest pains -Troponin is 21, repeat troponin is 22 -BNP is 591 which is lower than prior levels --EKG sinus bradycardia without other acute findings -Continue Coreg, Plavix previously discontinued as patient is already on Eliquis -IV nitro drip as above  3)PAFib----continue Coreg and amiodarone for rate control and Eliquis for stroke prophylaxis  4) chronic anemia----suspect related to underlying CKD 4 -Hemoglobin is 10.0 which is close to baseline, WBC and platelets WNL -Monitor closely as patient is on Eliquis no evidence of  bleeding at this time  5)CKD stage -IV   -creatinine is 2.71 which is not far from prior baseline BUN 81 -Renal function was not apparently given persistent elevated BP -Continue torsemide and Aldactone --renally adjust medications, avoid nephrotoxic agents / dehydration  / hypotension  6)HypoNatremia/Hypochloremia--- -Sodium is 128 which is not new chloride is 95 which is not new  -Due to diuretic use -Monitor closely  Disposition/Need for in-Hospital Stay- patient unable to be discharged at this time due to severely elevated blood pressures requiring IV agents for BP control  Status is: Inpatient  Remains inpatient appropriate because:   Dispo: The patient is from: Home              Anticipated d/c is to: Home              Anticipated d/c date is: 2 days              Patient currently is not medically stable to d/c. Barriers: Not Clinically Stable-   With History of - Reviewed by me  Past Medical History:  Diagnosis Date   Anemia    Bell's palsy    Breast cancer ([place]) 1998   Right mastectomy   CAD (coronary artery disease)    a. s/p STEMI in  01/2019 with DES to LAD b. s/p NSTEMI in 12/2019 with DES to RCA c. cath in 01/2020 showing patent stents   CHF (congestive heart failure) ([place])    a. EF 30-35% in 01/2019 b. 40-45% in 01/2020 c. EF at 65-70% by echo in 05/2020   Chronic kidney disease    Essential hypertension    GERD (gastroesophageal reflux disease)    Gout    History of skin cancer    Squamous cell, left shoulder   Mixed hyperlipidemia    NSTEMI (non-ST elevated myocardial infarction) ([place])    12/2019   Osteopenia    Paroxysmal atrial fibrillation (HCC)    Thyroid nodule    Type 2 diabetes mellitus ([place])       Past Surgical History:  Procedure Laterality Date   APPENDECTOMY     BIOPSY  10/13/2019   Procedure: BIOPSY;  Surgeon: Daneil Dolin, MD;  [facility];  Service: Endoscopy;;   CATARACT EXTRACTION  2016   COLONOSCOPY  2019   Dr  Anthony Sar   CORONARY ANGIOGRAPHY N/A 01/16/2020   Procedure: CORONARY ANGIOGRAPHY;  Surgeon: Jettie Booze, MD;  Location: [facility];  Service: Cardiovascular;  Laterality: N/A;   CORONARY STENT INTERVENTION N/A 01/16/2020   Procedure: CORONARY STENT INTERVENTION;  Surgeon: Jettie Booze, MD;  Location: [place] [facility];  Service: Cardiovascular;  Laterality: N/A;   CORONARY/GRAFT ACUTE MI REVASCULARIZATION N/A 01/30/2019   Procedure: Coronary/Graft Acute MI Revascularization;  Surgeon: Burnell Blanks, MD;  Location: [place];  Service: Cardiovascular;  Laterality: N/A;   ESOPHAGOGASTRODUODENOSCOPY (EGD) WITH PROPOFOL N/A 10/13/2019   Non-obstructing Schatzki ring at GE junction, s/p dilation, erosive gastropathy with stigmata of recent bleeding, normal duodenum. Negative H.pylori.    HYSTEROSCOPY     INTRAVASCULAR ULTRASOUND/IVUS N/A 01/16/2020   Procedure: Intravascular Ultrasound/IVUS;  Surgeon: Jettie Booze, MD;  Location: [facility] [facility];  Service: Cardiovascular;  Laterality: N/A;   LAPAROSCOPIC APPENDECTOMY N/A 08/03/2020   Procedure: APPENDECTOMY LAPAROSCOPIC;  Surgeon: Ronny Bacon, MD;  Location: [facility];  Service: General;  Laterality: N/A;   LEFT HEART CATH AND CORONARY ANGIOGRAPHY N/A 01/30/2019   Procedure: LEFT HEART CATH AND CORONARY ANGIOGRAPHY;  Surgeon: Burnell Blanks, MD;  Location: [facility];  Service: Cardiovascular;  Laterality: N/A;   LEFT HEART CATH AND CORONARY ANGIOGRAPHY N/A 01/16/2020   Procedure: LEFT HEART CATH AND CORONARY ANGIOGRAPHY;  Surgeon: Jettie Booze, MD;  Location: [facility];  Service: Cardiovascular;  Laterality: N/A;   MALONEY DILATION N/A 10/13/2019   Procedure: Venia Minks DILATION;  Surgeon: Daneil Dolin, MD;  [facility];  Service: Endoscopy;  Laterality: N/A;   Right mastectomy  1998   [facility]   RIGHT/LEFT HEART CATH AND CORONARY ANGIOGRAPHY N/A  02/13/2020   Procedure: RIGHT/LEFT HEART CATH AND CORONARY ANGIOGRAPHY;  Surgeon: Martinique, Peter M, MD;  Location: [facility];  Service: Cardiovascular;  Laterality: N/A;    Chief Complaint  Patient presents with   Shortness of Breath      HPI:    Crystal [name]  is a 87 y.o. female with medical history significant of iron deficiency anemia, Bell's palsy, history of breast cancer and right mastectomy, CAD with prior STEMI, status post prior angioplasty and stent placements, h/o chronic systolic heart failure, paroxysmal atrial fibrillation, stage IV chronic kidney disease, HTN, GERD, gout, history of squamous cell carcinoma of the skin, mixed hyperlipidemia, osteopenia, thyroid nodule,  type 2 diabetes mellitus (diet-controlled) presents to the ED with persistently elevated blood pressures despite compliance with antihypertensive regimen -Additional history obtained from patient's niece at bedside [name]...  Patient denies frank chest pains patient has shortness of breath and pain around the scapular area -No headaches or palpitations or dizziness -Systolic blood pressures were persistently over 200 in the ED..  Diastolic blood pressure was mostly in the 50s and 60s -EDP started patient on IV nitro drip after she failed to respond to bolus doses of IV hydralazine -Patient has baseline bradycardia limiting shortness of antihypertensives - In ED chest x-ray with cardiomegaly and COPD and CAD otherwise no acute findings, no mediastinal widening -Troponin is 21, repeat troponin is 22 -BNP is 591 which is lower than prior levels -INR is 1.2 -Hemoglobin is 10.0 which is close to baseline, WBC and platelets WNL -Sodium is 128 which is not new chloride is 95 which is not new creatinine is 2.71 which is not far from prior baseline BUN 81 -EKG sinus bradycardia without other acute findings   Review of systems:    In addition to the HPI above,   A full Review of  Systems was  done, all other systems reviewed are negative except as noted above in HPI , .    Social History:  Reviewed by me    Social History   Tobacco Use   Smoking status: Never    Passive exposure: Past   Smokeless tobacco: Never  Substance Use Topics   Alcohol use: Never       Family History :  Reviewed by me    Family History  Problem Relation Age of Onset   Stroke Mother    Heart attack Father    Aneurysm Sister    Heart attack Maternal Uncle    Heart disease Maternal Uncle    Heart attack Paternal Aunt    Heart disease Paternal Aunt    Heart attack Paternal Grandfather    Heart disease Paternal Grandfather    Heart attack Paternal Aunt    Heart disease Paternal Aunt    Heart attack Maternal Uncle    Heart disease Maternal Uncle    Heart attack Maternal Uncle    Heart disease Maternal Uncle    Heart attack Sister    Heart disease Sister    Cancer Sister    Colon cancer Neg Hx    Colon polyps Neg Hx      Home Medications:   Prior to Admission medications   Medication Sig Start Date End Date Taking? Authorizing Provider  acetaminophen (TYLENOL) 500 MG tablet Take 500 mg by mouth every 6 (six) hours as needed for headache (pain).   Yes [provider]  amiodarone (PACERONE) 200 MG tablet Take 200 mg by mouth daily.   Yes [provider]  apixaban (ELIQUIS) 2.5 MG TABS tablet TAKE (1) TABLET TWICE DAILY. Patient taking differently: Take 5 mg by mouth 2 (two) times daily. 08/06/21  Yes Satira Sark, MD  CALCIUM CARB-CHOLECALCIFEROL PO Take 1 tablet by mouth daily.   Yes [provider]  carvedilol (COREG) 6.25 MG tablet Take 1 tablet (6.25 mg total) by mouth 2 (two) times daily. 03/13/21  Yes Satira Sark, MD  cholecalciferol (VITAMIN D3) 25 MCG (1000 UT) tablet Take 1,000 Units by mouth daily with breakfast.   Yes [provider]  diazepam (VALIUM) 2 MG tablet Take 2 mg by mouth 2 (two) times daily as needed for anxiety.  Yes [provider]  docusate sodium (COLACE) 100 MG capsule Take 100 mg by mouth daily as needed for mild constipation.   Yes [provider]  ferrous sulfate 325 (65 FE) MG tablet Take 325 mg by mouth daily before lunch.   Yes [provider]  hydrALAZINE (APRESOLINE) 100 MG tablet Take 100 mg by mouth in the morning, at noon, and at bedtime. 10/10/20 10/10/21 Yes [provider]  isosorbide mononitrate (IMDUR) 120 MG 24 hr tablet TAKE 1 TABLET BY MOUTH AT BEDTIME. Patient taking differently: Take 120 mg by mouth daily. 02/06/21  Yes Satira Sark, MD  loperamide (IMODIUM) 2 MG capsule Take 2 mg by mouth as needed for diarrhea or loose stools.   Yes [provider]  meclizine (ANTIVERT) 25 MG tablet Take 25 mg by mouth as needed for dizziness.   Yes [provider]  Multiple Vitamin (MULTIVITAMIN WITH MINERALS) TABS tablet Take 1 tablet by mouth daily. Centrum Silver for Women   Yes [provider]  multivitamin-lutein (OCUVITE-LUTEIN) CAPS capsule Take 1 capsule by mouth daily before lunch.    Yes [provider]  nitroGLYCERIN (NITROSTAT) 0.4 MG SL tablet PLACE 1 TAB UNDER TONGUE EVERY 3 MINUTES AS DIRECTED UP TO 3 TIMES AS NEEDED FOR CHEST PAIN. Patient taking differently: Place 0.4 mg under the tongue every 5 (five) minutes as needed for chest pain. 01/07/21  Yes Reino Bellis B, NP  potassium chloride (KLOR-CON) 10 MEQ tablet Take 10 mEq by mouth daily. 11/22/20  Yes [provider]  saccharomyces boulardii (FLORASTOR) 250 MG capsule Take 250 mg by mouth as needed (stomach issues).   Yes [provider]  Simethicone (GAS-X PO) Take 1 capsule by mouth daily as needed (gas).   Yes [provider]  spironolactone (ALDACTONE) 25 MG tablet Take 12.5 mg by mouth daily.   Yes [provider]  torsemide (DEMADEX) 20 MG tablet Take 20 mg by mouth daily.   Yes [provider]   sodium chloride (OCEAN) 0.65 % SOLN nasal spray Place 1 spray into both nostrils as needed for congestion.    [provider]     Allergies:     Allergies  Allergen Reactions   Bactrim [Sulfamethoxazole-Trimethoprim] Nausea And Vomiting   Sulfa Antibiotics Nausea And Vomiting   Amlodipine Other (See Comments)    EDEMA   Clonidine Derivatives Other (See Comments)    FATIGUE   Evista [Raloxifene Hcl] Other (See Comments)    GERD   Fosamax [Alendronate Sodium] Other (See Comments)    INCREASED GERD    Glipizide Other (See Comments)    PALPITATIONS   Lipitor [Atorvastatin Calcium] Other (See Comments)    ACHING   Losartan Other (See Comments)    FATIGUE    Pravastatin Other (See Comments)    ACHING   Azithromycin Rash and Other (See Comments)    FACIAL BURNING      Physical Exam:   Vitals  Blood pressure (!) 195/55, pulse (!) 59, temperature 97.7 F (36.5 C), temperature source Oral, resp. rate 20, height '5\' 6"'$  (1.676 m), weight 52.2 kg, SpO2 94 %. Temp:  [97.7 F (36.5 C)] 97.7 F (36.5 C) (09/02 2140) Pulse Rate:  [56-65] 59 (09/02 2140) Resp:  [12-24] 20 (09/02 2140) BP: (182-246)/(48-92) 246/52 (09/02 2227) SpO2:  [94 %-100 %] 94 % (09/02 2140) Weight:  [52.2 kg] 52.2 kg (09/02 1453)  Physical Examination: General appearance - alert,  in no distress Mental status -  alert, oriented to person, place, and time,  Eyes - sclera anicteric Neck - supple, no JVD elevation , Chest - clear  to auscultation bilaterally, symmetrical air movement,  Heart - S1 and S2 normal, regular  Abdomen - soft, nontender, nondistended, +BS Neurological - screening mental status exam normal, neck supple without rigidity, cranial nerves II through XII intact, DTR's normal and symmetric Extremities - no pedal edema noted, intact peripheral pulses  Skin - warm, dry    Data Review:    CBC Recent Labs  Lab 09/28/21 1518  WBC 6.7  HGB 10.0*  HCT 29.2*  PLT 295  MCV 81.1   MCH 27.8  MCHC 34.2  RDW 14.9  LYMPHSABS 1.4  MONOABS 0.8  EOSABS 0.1  BASOSABS 0.0   ------------------------------------------------------------------------------------------------------------------  Chemistries  Recent Labs  Lab 09/28/21 1518  NA 128*  K 4.1  CL 95*  CO2 23  GLUCOSE 160*  BUN 81*  CREATININE 2.71*  CALCIUM 9.1  AST 39  ALT 35  ALKPHOS 64  BILITOT 0.9   ------------------------------------------------------------------------------------------------------------------ estimated creatinine clearance is 12.7 mL/min (A) (by C-G formula based on SCr of 2.71 mg/dL (H)). ------------------------------------------------------------------------------------------------------------------ Coagulation profile Recent Labs  Lab 09/28/21 1518  INR 1.2   ------------------------------------------------------------------------------------------------------------------- ------------------------------------------------------------------------------------------------------------------    Component Value Date/Time   BNP 591.0 (H) 09/28/2021 1607   --------------------------------------------------------------------------------------------------------------  Urinalysis    Component Value Date/Time   COLORURINE YELLOW 08/02/2020 0947   APPEARANCEUR CLEAR 08/02/2020 0947   LABSPEC 1.009 08/02/2020 0947   PHURINE 5.0 08/02/2020 0947   GLUCOSEU NEGATIVE 08/02/2020 0947   HGBUR SMALL (A) 08/02/2020 0947   BILIRUBINUR NEGATIVE 08/02/2020 [place] 08/02/2020 0947   PROTEINUR 100 (A) 08/02/2020 0947   NITRITE NEGATIVE 08/02/2020 0947   LEUKOCYTESUR NEGATIVE 08/02/2020 0947    ----------------------------------------------------------------------------------------------------------------   Imaging Results:    DG Chest Portable 1 View  Result Date: 09/28/2021 CLINICAL DATA:  Shortness of breath EXAM: PORTABLE CHEST 1 VIEW COMPARISON:  Previous  studies including the examination of 07/02/2021 FINDINGS: Transverse diameter of heart is increased. Low position of diaphragms suggests COPD. There are no signs of pulmonary edema or focal pulmonary consolidation. Coronary artery stent is noted overlying the left side of heart. There is no pleural effusion or pneumothorax. Surgical clips are seen in right chest wall. IMPRESSION: Cardiomegaly. COPD. Coronary artery disease. There are no signs of pulmonary edema or focal pulmonary consolidation. Electronically Signed   By: Elmer Picker M.D.   On: 09/28/2021 15:47    Radiological Exams on Admission: DG Chest Portable 1 View  Result Date: 09/28/2021 CLINICAL DATA:  Shortness of breath EXAM: PORTABLE CHEST 1 VIEW COMPARISON:  Previous studies including the examination of 07/02/2021 FINDINGS: Transverse diameter of heart is increased. Low position of diaphragms suggests COPD. There are no signs of pulmonary edema or focal pulmonary consolidation. Coronary artery stent is noted overlying the left side of heart. There is no pleural effusion or pneumothorax. Surgical clips are seen in right chest wall. IMPRESSION: Cardiomegaly. COPD. Coronary artery disease. There are no signs of pulmonary edema or focal pulmonary consolidation. Electronically Signed   By: Elmer Picker M.D.   On: 09/28/2021 15:47    DVT Prophylaxis -SCD/Eliquis AM Labs Ordered, also please review Full Orders  Family Communication: Admission, patients condition and plan of care including tests being ordered have been discussed with the patient and Niece [name] who indicate understanding and agree with the plan   Condition  -stable  Kissa Campoy  M.D on 09/28/2021 at 10:08 PM Go to [email or web address] -  for contact info  [facility] - Office  [phone]

## 2021-09-29 ENCOUNTER — Other Ambulatory Visit (HOSPITAL_COMMUNITY): Payer: Self-pay | Admitting: *Deleted

## 2021-09-29 ENCOUNTER — Inpatient Hospital Stay (HOSPITAL_COMMUNITY): Payer: Medicare Other

## 2021-09-29 DIAGNOSIS — N184 Chronic kidney disease, stage 4 (severe): Secondary | ICD-10-CM

## 2021-09-29 DIAGNOSIS — E1122 Type 2 diabetes mellitus with diabetic chronic kidney disease: Secondary | ICD-10-CM

## 2021-09-29 DIAGNOSIS — E871 Hypo-osmolality and hyponatremia: Secondary | ICD-10-CM

## 2021-09-29 DIAGNOSIS — I2510 Atherosclerotic heart disease of native coronary artery without angina pectoris: Secondary | ICD-10-CM | POA: Diagnosis not present

## 2021-09-29 DIAGNOSIS — I169 Hypertensive crisis, unspecified: Secondary | ICD-10-CM | POA: Diagnosis not present

## 2021-09-29 DIAGNOSIS — R9431 Abnormal electrocardiogram [ECG] [EKG]: Secondary | ICD-10-CM | POA: Diagnosis not present

## 2021-09-29 DIAGNOSIS — I480 Paroxysmal atrial fibrillation: Secondary | ICD-10-CM

## 2021-09-29 DIAGNOSIS — I2583 Coronary atherosclerosis due to lipid rich plaque: Secondary | ICD-10-CM

## 2021-09-29 LAB — BASIC METABOLIC PANEL
Anion gap: 7 (ref 5–15)
BUN: 79 mg/dL — ABNORMAL HIGH (ref 8–23)
CO2: 23 mmol/L (ref 22–32)
Calcium: 8.9 mg/dL (ref 8.9–10.3)
Chloride: 100 mmol/L (ref 98–111)
Creatinine, Ser: 2.6 mg/dL — ABNORMAL HIGH (ref 0.44–1.00)
GFR, Estimated: 18 mL/min — ABNORMAL LOW (ref >60)
Glucose, Bld: 107 mg/dL — ABNORMAL HIGH (ref 70–99)
Potassium: 3.9 mmol/L (ref 3.5–5.1)
Sodium: 130 mmol/L — ABNORMAL LOW (ref 135–145)

## 2021-09-29 LAB — CBC
HCT: 28.2 % — ABNORMAL LOW (ref 36.0–46.0)
Hemoglobin: 9.4 g/dL — ABNORMAL LOW (ref 12.0–15.0)
MCH: 27.7 pg (ref 26.0–34.0)
MCHC: 33.3 g/dL (ref 30.0–36.0)
MCV: 83.2 fL (ref 80.0–100.0)
Platelets: 242 10*3/uL (ref 150–400)
RBC: 3.39 MIL/uL — ABNORMAL LOW (ref 3.87–5.11)
RDW: 14.9 % (ref 11.5–15.5)
WBC: 8.7 10*3/uL (ref 4.0–10.5)
nRBC: 0 % (ref 0.0–0.2)

## 2021-09-29 LAB — HEMOGLOBIN A1C
Hgb A1c MFr Bld: 5.6 % (ref 4.8–5.6)
Mean Plasma Glucose: 114.02 mg/dL

## 2021-09-29 LAB — ECHOCARDIOGRAM COMPLETE
Area-P 1/2: 2.26 cm2
Height: 66 in
S' Lateral: 2.1 cm
Weight: 1840 oz

## 2021-09-29 LAB — MRSA NEXT GEN BY PCR, NASAL: MRSA by PCR Next Gen: NOT DETECTED

## 2021-09-29 MED ORDER — SPIRONOLACTONE 25 MG PO TABS
25.0000 mg | ORAL_TABLET | Freq: Every day | ORAL | Status: DC
  Administered 2021-09-29 – 2021-10-01 (×3): 25 mg via ORAL
  Filled 2021-09-29 (×4): qty 1

## 2021-09-29 MED ORDER — CHLORHEXIDINE GLUCONATE CLOTH 2 % EX PADS
6.0000 | MEDICATED_PAD | Freq: Every day | CUTANEOUS | Status: DC
Start: 2021-09-29 — End: 2021-10-02
  Administered 2021-09-29 – 2021-10-01 (×3): 6 via TOPICAL

## 2021-09-29 MED ORDER — APIXABAN 2.5 MG PO TABS
2.5000 mg | ORAL_TABLET | Freq: Two times a day (BID) | ORAL | Status: DC
  Administered 2021-09-29 – 2021-10-03 (×9): 2.5 mg via ORAL
  Filled 2021-09-29 (×9): qty 1

## 2021-09-29 MED ORDER — CARVEDILOL 12.5 MG PO TABS
12.5000 mg | ORAL_TABLET | Freq: Two times a day (BID) | ORAL | Status: DC
  Administered 2021-09-29 – 2021-10-03 (×9): 12.5 mg via ORAL
  Filled 2021-09-29 (×9): qty 1

## 2021-09-29 MED ORDER — BUSPIRONE HCL 5 MG PO TABS
5.0000 mg | ORAL_TABLET | Freq: Three times a day (TID) | ORAL | Status: DC
  Administered 2021-09-29 – 2021-10-03 (×12): 5 mg via ORAL
  Filled 2021-09-29 (×12): qty 1

## 2021-09-29 NOTE — Progress Notes (Signed)
Nitro gtt restarted per Dr. Josph Macho for rebound HTN, no PRN at this time just titrate per protocol.

## 2021-09-29 NOTE — Assessment & Plan Note (Addendum)
-  Negative high sensitive troponin x2 -After improvement in her blood pressure levels she reports no shortness of breath sensation and denies chest pain. -Continue the use of beta-blocker and continue the use of Eliquis. -Continue outpatient follow-up with cardiology service. -Heart healthy diet discussed with patient.

## 2021-09-29 NOTE — Assessment & Plan Note (Addendum)
-  Patient with type 2 diabetes with nephropathy -Continue to follow CBGs and adjust hypoglycemia regimen as needed. -Patient will be treated for underlying gout with the use of prednisone; anticipating CBGs to get elevated.

## 2021-09-29 NOTE — Assessment & Plan Note (Addendum)
-  Rate controlled currently -Continue the use of amiodarone and beta-blocker. -Continue Eliquis for secondary prevention. -Continue telemetry monitoring.

## 2021-09-29 NOTE — Progress Notes (Signed)
Progress Note   Patient: Crystal [name] STM:[id number] DOB: 1934-09-13 DOA: 09/28/2021     1 DOS: the patient was seen and examined on 09/29/2021   Brief hospital admission course: As per H&P written by Dr. Denton Brick on 09/28/2021  Crystal [name]  is a 87 y.o. female with medical history significant of iron deficiency anemia, Bell's palsy, history of breast cancer and right mastectomy, CAD with prior STEMI, status post prior angioplasty and stent placements, h/o chronic systolic heart failure, paroxysmal atrial fibrillation, stage IV chronic kidney disease, HTN, GERD, gout, history of squamous cell carcinoma of the skin, mixed hyperlipidemia, osteopenia, thyroid nodule, type 2 diabetes mellitus (diet-controlled) presents to the ED with persistently elevated blood pressures despite compliance with antihypertensive regimen -Additional history obtained from patient's niece at bedside [name]...  Patient denies frank chest pains patient has shortness of breath and pain around the scapular area -No headaches or palpitations or dizziness -Systolic blood pressures were persistently over 200 in the ED..  Diastolic blood pressure was mostly in the 50s and 60s -EDP started patient on IV nitro drip after she failed to respond to bolus doses of IV hydralazine -Patient has baseline bradycardia limiting shortness of antihypertensives - In ED chest x-ray with cardiomegaly and COPD and CAD otherwise no acute findings, no mediastinal widening -Troponin is 21, repeat troponin is 22 -BNP is 591 which is lower than prior levels -INR is 1.2 -Hemoglobin is 10.0 which is close to baseline, WBC and platelets WNL -Sodium is 128 which is not new chloride is 95 which is not new creatinine is 2.71 which is not far from prior baseline BUN 81 -EKG sinus bradycardia without other acute findings  Assessment and Plan: * Hypertensive crisis - Patient reporting shortness of breath and pain around the scapular area  while experiencing elevated blood pressure. -Requiring nitroglycerin drip along with adjusted home antihypertensive agents to further control blood pressure -We will continue weaning drip of as tolerated -Heart healthy diet discussed with patient -Instructed to maintain adequate hydration -Follow-up renal function and electrolytes. -2D echo has been ordered; will follow results.  CKD (chronic kidney disease), stage IV (HCC) - Appears to be stable and currently at baseline -Continue to follow renal function trend and electrolytes stability. -Continue to minimize as much as possible nephrotoxic agents, the use of contrast, avoiding hypotension and maintaining adequate hydration.  AF (paroxysmal atrial fibrillation) (HCC) - Rate control currently -Continue the use of amiodarone and beta-blocker. -Continue Eliquis for secondary prevention.  Anemia - History of chronic anemia in the setting of chronic kidney disease stage IV -No signs of overt bleeding appreciated -Hemoglobin overall stable -Continue to follow trend. -Patient is actively following with nephrology as an outpatient; IV iron and Epogen therapy as per nephrology discretion.  CAD (coronary artery disease) -Negative high sensitive troponin x2 -After improvement in her blood pressure levels she reports no shortness of breath sensation and denies chest pain. -Continue the use of beta-blocker and continue the use of Eliquis. -Continue patient follow-up with cardiology service. -Heart healthy diet discussed with patient.  Type 2 diabetes mellitus ([place]) - Patient with type 2 diabetes with nephropathy -Continue to follow CBGs and adjust hypoglycemia regimen as needed.  Hyponatremia/hypochloremia -In the setting of chronic diuretic usage and chronic renal failure. -Appears to be close to her baseline -Continue to follow electrolytes closely -Patient advised to maintain adequate oral hydration.  Subjective:  Reports feeling  weak, no chest pain, no nausea vomiting.  Overall improved breathing appreciated.  Still with elevated blood pressure requiring the use of nitroglycerin drip.  Patient is afebrile.  Physical Exam: Vitals:   09/29/21 0800 09/29/21 0815 09/29/21 0830 09/29/21 0845  BP: (!) 173/28 (!) 184/32 (!) 190/43 (!) 173/33  Pulse: (!) 58 (!) 59 65 64  Resp: '14 15 20 '$ (!) 25  Temp:      TempSrc:      SpO2: 98% 99% 98% 91%  Weight:      Height:       General exam: Alert, awake, oriented x 3; anxious on examination; no fever, no chest pain, no nausea vomiting. Respiratory system: Clear to auscultation. Respiratory effort normal.  Good saturation on room air; no using accessory muscles.  Expressing feeling short winded with activity while blood pressure is high. Cardiovascular system: Rate controlled, no rubs, no gallops, no JVD on exam.  No murmurs appreciated. Gastrointestinal system: Abdomen is nondistended, soft and nontender. No organomegaly or masses felt. Normal bowel sounds heard. Central nervous system: Alert and oriented. No focal neurological deficits. Extremities: No cyanosis or clubbing; no edema. Skin: No petechiae. Psychiatry: Judgement and insight appear normal.  Patient reports feeling anxious.  Data Reviewed: Basic metabolic panel: Sodium 546, potassium 3.9, chloride 100, bicarb 23, BUN 79, creatinine 2.60 and GFR 19. CBC: WBC 8.7, hemoglobin 9.4 and platelet count 242 K  Family Communication: Niece at bedside.  Disposition: Status is: Inpatient Remains inpatient appropriate because: Still requiring IV medications and nitroglycerin drip to assist controlling blood pressure.   Planned Discharge Destination: Home; slightly with home health services.   Author: Barton Dubois, MD 09/29/2021 10:52 AM  For on call review [email or web address].

## 2021-09-29 NOTE — Assessment & Plan Note (Addendum)
-  Patient reporting shortness of breath and pain around the scapular area while experiencing elevated blood pressure. -Requiring nitroglycerin drip along with adjusted home antihypertensive agents to further control blood pressure -Continue to follow vital signs while off blood pressure control and drip and continue adjusting antihypertensive treatment. -Heart healthy diet discussed with patient -Instructed to maintain adequate hydration -Continue to follow-up renal function and electrolytes. -2D echo demonstrating preserved ejection fraction, 60-65%, no regional wall motion abnormalities, normal diastolic parameters appreciated.  No significant valvular disorder. -Patient blood pressure stable overall; experiencing intermittent episode of nausea/vomiting that will require further evaluation prior to discharge. -Heart healthy diet once again discussed with patient.

## 2021-09-29 NOTE — Assessment & Plan Note (Signed)
-  History of chronic anemia in the setting of chronic kidney disease stage IV -Hemoglobin overall stable and no signs of overt bleeding currently. -Continue to follow hemoglobin trend. -Patient is actively following with nephrology as an outpatient; IV iron and Epogen therapy as per nephrology discretion.

## 2021-09-29 NOTE — Progress Notes (Signed)
*  PRELIMINARY RESULTS* Echocardiogram 2D Echocardiogram has been performed.  Crystal [name] 09/29/2021, 11:39 AM

## 2021-09-29 NOTE — Assessment & Plan Note (Signed)
-  Appears to be stable and currently at baseline -Continue to follow renal function trend and electrolytes stability. -Continue to minimize as much as possible nephrotoxic agents, the use of contrast, avoiding hypotension and maintaining adequate hydration.

## 2021-09-29 NOTE — Progress Notes (Signed)
Nitro gtt titrated per protocol and turned off, Dr. Josph Macho aware, pt HR 49 while sleeping coreg was given 6.'25mg'$ . pt states she feels much better, and is resting.

## 2021-09-30 DIAGNOSIS — I169 Hypertensive crisis, unspecified: Secondary | ICD-10-CM | POA: Diagnosis not present

## 2021-09-30 DIAGNOSIS — I480 Paroxysmal atrial fibrillation: Secondary | ICD-10-CM | POA: Diagnosis not present

## 2021-09-30 DIAGNOSIS — N184 Chronic kidney disease, stage 4 (severe): Secondary | ICD-10-CM | POA: Diagnosis not present

## 2021-09-30 DIAGNOSIS — I2510 Atherosclerotic heart disease of native coronary artery without angina pectoris: Secondary | ICD-10-CM | POA: Diagnosis not present

## 2021-09-30 MED ORDER — DICLOFENAC SODIUM 1 % EX GEL
2.0000 g | Freq: Three times a day (TID) | CUTANEOUS | Status: DC | PRN
  Administered 2021-09-30: 2 g via TOPICAL
  Filled 2021-09-30: qty 100

## 2021-09-30 NOTE — Progress Notes (Signed)
Dr Dyann Kief made aware of patient's heart rate being sinus brady (in the 50's) and blood pressures prior to giving ALL BP meds this morning. Per verbal from Dr Dyann Kief, ok to give at this time due to blood pressures. Before lunch, patient complained of feeling nauseous, PRN Zofran given. Patient vomited x1 small amount of lunch which was right after getting the zofran. Dr Dyann Kief made aware. Patient alert and oriented x4 and sitting up in chair.

## 2021-09-30 NOTE — TOC Progression Note (Signed)
  Transition of Care [facility]) Screening Note   Patient Details  Name: Crystal [name] Date of Birth: 1934/02/02   Transition of Care [facility]) CM/SW Contact:    Shade Flood, LCSW Phone Number: 09/30/2021, 1:38 PM    Transition of Care Department [facility]) has reviewed patient and no TOC needs have been identified at this time. We will continue to monitor patient advancement through interdisciplinary progression rounds. If new patient transition needs arise, please place a TOC consult.

## 2021-09-30 NOTE — Progress Notes (Signed)
Progress Note   Patient: Crystal [name] KYH:[id number] DOB: 05-Mar-1934 DOA: 09/28/2021     2 DOS: the patient was seen and examined on 09/30/2021   Brief hospital admission course: As per H&P written by Dr. Denton Brick on 09/28/2021  Crystal [name]  is a 87 y.o. female with medical history significant of iron deficiency anemia, Bell's palsy, history of breast cancer and right mastectomy, CAD with prior STEMI, status post prior angioplasty and stent placements, h/o chronic systolic heart failure, paroxysmal atrial fibrillation, stage IV chronic kidney disease, HTN, GERD, gout, history of squamous cell carcinoma of the skin, mixed hyperlipidemia, osteopenia, thyroid nodule, type 2 diabetes mellitus (diet-controlled) presents to the ED with persistently elevated blood pressures despite compliance with antihypertensive regimen -Additional history obtained from patient's niece at bedside [name]...  Patient denies frank chest pains patient has shortness of breath and pain around the scapular area -No headaches or palpitations or dizziness -Systolic blood pressures were persistently over 200 in the ED..  Diastolic blood pressure was mostly in the 50s and 60s -EDP started patient on IV nitro drip after she failed to respond to bolus doses of IV hydralazine -Patient has baseline bradycardia limiting shortness of antihypertensives - In ED chest x-ray with cardiomegaly and COPD and CAD otherwise no acute findings, no mediastinal widening -Troponin is 21, repeat troponin is 22 -BNP is 591 which is lower than prior levels -INR is 1.2 -Hemoglobin is 10.0 which is close to baseline, WBC and platelets WNL -Sodium is 128 which is not new chloride is 95 which is not new creatinine is 2.71 which is not far from prior baseline BUN 81 -EKG sinus bradycardia without other acute findings  Assessment and Plan: * Hypertensive crisis -Patient reporting shortness of breath and pain around the scapular area  while experiencing elevated blood pressure. -Requiring nitroglycerin drip along with adjusted home antihypertensive agents to further control blood pressure -Continue to follow vital signs while off blood pressure control and drip and continue adjusting antihypertensive treatment. -Heart healthy diet discussed with patient -Instructed to maintain adequate hydration -Continue to follow-up renal function and electrolytes. -2D echo demonstrating preserved ejection fraction, 60-65%, no regional wall motion abnormalities, normal diastolic parameters appreciated.  No significant valvular disorder. -Patient will be transferred to telemetry bed; hopefully ready for discharge in a.m.  CKD (chronic kidney disease), stage IV (HCC) - Appears to be stable and currently at baseline -Continue to follow renal function trend and electrolytes stability. -Continue to minimize as much as possible nephrotoxic agents, the use of contrast, avoiding hypotension and maintaining adequate hydration.  AF (paroxysmal atrial fibrillation) (HCC) - Rate control currently -Continue the use of amiodarone and beta-blocker. -Continue Eliquis for secondary prevention.  Anemia - History of chronic anemia in the setting of chronic kidney disease stage IV -No signs of overt bleeding appreciated -Hemoglobin overall stable -Continue to follow trend. -Patient is actively following with nephrology as an outpatient; IV iron and Epogen therapy as per nephrology discretion.  CAD (coronary artery disease) -Negative high sensitive troponin x2 -After improvement in her blood pressure levels she reports no shortness of breath sensation and denies chest pain. -Continue the use of beta-blocker and continue the use of Eliquis. -Continue patient follow-up with cardiology service. -Heart healthy diet discussed with patient.  Type 2 diabetes mellitus ([place]) - Patient with type 2 diabetes with nephropathy -Continue to follow CBGs and  adjust hypoglycemia regimen as needed.  Hyponatremia/hypochloremia -In the setting of chronic diuretic usage and chronic renal failure. -  Continue to follow electrolytes trend -Repeat basic metabolic panel in the morning -Continue to maintain adequate hydration.  Subjective:  Afebrile, no chest pain, no nausea, no vomiting, no shortness of breath.  Reports feeling much better today.  Currently off nitroglycerin drip and so far controlling blood pressure.  Patient expressed feeling weak.  Physical Exam: Vitals:   09/30/21 0700 09/30/21 0709 09/30/21 0830 09/30/21 0925  BP: (!) 174/33  (!) 148/36 (!) 141/31  Pulse: (!) 51 (!) 53 (!) 50   Resp: '17 14 15   '$ Temp:  98.7 F (37.1 C)    TempSrc:  Oral    SpO2: 96% 96%    Weight:      Height:       General exam: Alert, awake, oriented x 3; anxious today; denies chest pain, no nausea or vomiting.  Sitting up in the chair and expressed overall improvement in her condition. Respiratory system: Good air movement bilaterally; no using accessory muscles.  Good saturation on room air. Cardiovascular system:Rate controlled; no rubs, no gallops, no JVD appreciated on exam. Gastrointestinal system: Abdomen is nondistended, soft and nontender. No organomegaly or masses felt. Normal bowel sounds heard. Central nervous system: Alert and oriented. No focal neurological deficits. Extremities: No cyanosis or clubbing. Skin: No petechiae. Psychiatry: Judgement and insight appear normal. Mood & affect appropriate.   Data Reviewed: Basic metabolic panel: Sodium 833, potassium 3.9, chloride 100, bicarb 23, BUN 79, creatinine 2.60 and GFR 19. CBC: WBC 8.7, hemoglobin 9.4 and platelet count 242 K  No new data to review on 09/30/2021; blood work to be repeated again 523  Family Communication: Niece at bedside.  Disposition: Status is: Inpatient Remains inpatient appropriate because: Still requiring IV medications and nitroglycerin drip to assist controlling  blood pressure.   Planned Discharge Destination: Home; slightly with home health services.   Author: Barton Dubois, MD 09/30/2021 11:15 AM  For on call review [email or web address].

## 2021-10-01 DIAGNOSIS — I2510 Atherosclerotic heart disease of native coronary artery without angina pectoris: Secondary | ICD-10-CM | POA: Diagnosis not present

## 2021-10-01 DIAGNOSIS — Z7189 Other specified counseling: Secondary | ICD-10-CM | POA: Diagnosis not present

## 2021-10-01 DIAGNOSIS — M109 Gout, unspecified: Secondary | ICD-10-CM

## 2021-10-01 DIAGNOSIS — N184 Chronic kidney disease, stage 4 (severe): Secondary | ICD-10-CM | POA: Diagnosis not present

## 2021-10-01 DIAGNOSIS — I169 Hypertensive crisis, unspecified: Secondary | ICD-10-CM | POA: Diagnosis not present

## 2021-10-01 DIAGNOSIS — R112 Nausea with vomiting, unspecified: Secondary | ICD-10-CM | POA: Diagnosis present

## 2021-10-01 DIAGNOSIS — I480 Paroxysmal atrial fibrillation: Secondary | ICD-10-CM | POA: Diagnosis not present

## 2021-10-01 LAB — BASIC METABOLIC PANEL
Anion gap: 8 (ref 5–15)
BUN: 65 mg/dL — ABNORMAL HIGH (ref 8–23)
CO2: 22 mmol/L (ref 22–32)
Calcium: 8.6 mg/dL — ABNORMAL LOW (ref 8.9–10.3)
Chloride: 98 mmol/L (ref 98–111)
Creatinine, Ser: 2.47 mg/dL — ABNORMAL HIGH (ref 0.44–1.00)
GFR, Estimated: 19 mL/min — ABNORMAL LOW (ref >60)
Glucose, Bld: 91 mg/dL (ref 70–99)
Potassium: 4.1 mmol/L (ref 3.5–5.1)
Sodium: 128 mmol/L — ABNORMAL LOW (ref 135–145)

## 2021-10-01 LAB — URIC ACID: Uric Acid, Serum: 9.1 mg/dL — ABNORMAL HIGH (ref 2.5–7.1)

## 2021-10-01 LAB — GLUCOSE, CAPILLARY: Glucose-Capillary: 98 mg/dL (ref 70–99)

## 2021-10-01 LAB — MAGNESIUM: Magnesium: 1.9 mg/dL (ref 1.7–2.4)

## 2021-10-01 MED ORDER — PREDNISONE 20 MG PO TABS
20.0000 mg | ORAL_TABLET | Freq: Every day | ORAL | Status: DC
  Administered 2021-10-01 – 2021-10-03 (×3): 20 mg via ORAL
  Filled 2021-10-01 (×3): qty 1

## 2021-10-01 MED ORDER — PANTOPRAZOLE SODIUM 40 MG PO TBEC
40.0000 mg | DELAYED_RELEASE_TABLET | Freq: Two times a day (BID) | ORAL | Status: DC
  Administered 2021-10-01 – 2021-10-03 (×4): 40 mg via ORAL
  Filled 2021-10-01 (×4): qty 1

## 2021-10-01 MED ORDER — PROCHLORPERAZINE EDISYLATE 10 MG/2ML IJ SOLN
10.0000 mg | Freq: Four times a day (QID) | INTRAMUSCULAR | Status: DC | PRN
  Administered 2021-10-01: 10 mg via INTRAVENOUS
  Filled 2021-10-01: qty 2

## 2021-10-01 NOTE — Plan of Care (Signed)
  Problem: Acute Rehab PT Goals(only PT should resolve) Goal: Pt Will Go Supine/Side To Sit Outcome: Progressing Flowsheets (Taken 10/01/2021 1548) Pt will go Supine/Side to Sit: with modified independence Goal: Patient Will Transfer Sit To/From Stand Outcome: Progressing Flowsheets (Taken 10/01/2021 1548) Patient will transfer sit to/from stand: with modified independence Goal: Pt Will Transfer Bed To Chair/Chair To Bed Outcome: Progressing Flowsheets (Taken 10/01/2021 1548) Pt will Transfer Bed to Chair/Chair to Bed: with modified independence Goal: Pt Will Ambulate Outcome: Progressing Flowsheets (Taken 10/01/2021 1548) Pt will Ambulate:  100 feet  with supervision  with modified independence  with rolling walker   3:48 PM, 10/01/21 Lonell Grandchild, MPT Physical Therapist with [facility] [phone] office [phone] mobile phone

## 2021-10-01 NOTE — Care Management Important Message (Signed)
Important Message  Patient Details  Name: Crystal [name] MRN: [id number] Date of Birth: 1934/04/17   Medicare Important Message Given:  Yes (copy given at 10:40am)     Tommy Medal 10/01/2021, 3:16 PM

## 2021-10-01 NOTE — Assessment & Plan Note (Signed)
-   Complaining of left ankle/heel pain -Checking uric acid level -Empirical treatment with prednisone for 5 days -Continue topical Voltaren gel and maintain adequate hydration

## 2021-10-01 NOTE — Progress Notes (Signed)
Progress Note   Patient: Crystal [name] LOV:[id number] DOB: 06/19/1934 DOA: 09/28/2021     3 DOS: the patient was seen and examined on 10/01/2021   Brief hospital admission course: As per H&P written by Dr. Denton Brick on 09/28/2021  Crystal [name]  is a 87 y.o. female with medical history significant of iron deficiency anemia, Bell's palsy, history of breast cancer and right mastectomy, CAD with prior STEMI, status post prior angioplasty and stent placements, h/o chronic systolic heart failure, paroxysmal atrial fibrillation, stage IV chronic kidney disease, HTN, GERD, gout, history of squamous cell carcinoma of the skin, mixed hyperlipidemia, osteopenia, thyroid nodule, type 2 diabetes mellitus (diet-controlled) presents to the ED with persistently elevated blood pressures despite compliance with antihypertensive regimen -Additional history obtained from patient's niece at bedside [name]...  Patient denies frank chest pains patient has shortness of breath and pain around the scapular area -No headaches or palpitations or dizziness -Systolic blood pressures were persistently over 200 in the ED..  Diastolic blood pressure was mostly in the 50s and 60s -EDP started patient on IV nitro drip after she failed to respond to bolus doses of IV hydralazine -Patient has baseline bradycardia limiting shortness of antihypertensives - In ED chest x-ray with cardiomegaly and COPD and CAD otherwise no acute findings, no mediastinal widening -Troponin is 21, repeat troponin is 22 -BNP is 591 which is lower than prior levels -INR is 1.2 -Hemoglobin is 10.0 which is close to baseline, WBC and platelets WNL -Sodium is 128 which is not new chloride is 95 which is not new creatinine is 2.71 which is not far from prior baseline BUN 81 -EKG sinus bradycardia without other acute findings  Assessment and Plan: * Hypertensive crisis -Patient reporting shortness of breath and pain around the scapular area  while experiencing elevated blood pressure. -Requiring nitroglycerin drip along with adjusted home antihypertensive agents to further control blood pressure -Continue to follow vital signs while off blood pressure control and drip and continue adjusting antihypertensive treatment. -Heart healthy diet discussed with patient -Instructed to maintain adequate hydration -Continue to follow-up renal function and electrolytes. -2D echo demonstrating preserved ejection fraction, 60-65%, no regional wall motion abnormalities, normal diastolic parameters appreciated.  No significant valvular disorder. -Patient blood pressure stable overall; experiencing intermittent episode of nausea/vomiting that will require further evaluation prior to discharge. -Heart healthy diet once again discussed with patient.  CKD (chronic kidney disease), stage IV (HCC) -Appears to be stable and currently at baseline -Continue to follow renal function trend and electrolytes stability. -Continue to minimize as much as possible nephrotoxic agents, the use of contrast, avoiding hypotension and maintaining adequate hydration.  Nausea & vomiting - Most likely associated with GERD -PPI dose has been adjusted -Continue antiemetics and supportive care -Follow clinical response.  Gout - Complaining of left ankle/heel pain -Checking uric acid level -Empirical treatment with prednisone for 5 days -Continue topical Voltaren gel and maintain adequate hydration  AF (paroxysmal atrial fibrillation) (HCC) -Rate controlled currently -Continue the use of amiodarone and beta-blocker. -Continue Eliquis for secondary prevention. -Continue telemetry monitoring.  Anemia -History of chronic anemia in the setting of chronic kidney disease stage IV -Hemoglobin overall stable and no signs of overt bleeding currently. -Continue to follow hemoglobin trend. -Patient is actively following with nephrology as an outpatient; IV iron and Epogen  therapy as per nephrology discretion.  CAD (coronary artery disease) -Negative high sensitive troponin x2 -After improvement in her blood pressure levels she reports no shortness of breath  sensation and denies chest pain. -Continue the use of beta-blocker and continue the use of Eliquis. -Continue outpatient follow-up with cardiology service. -Heart healthy diet discussed with patient.  Type 2 diabetes mellitus ([place]) -Patient with type 2 diabetes with nephropathy -Continue to follow CBGs and adjust hypoglycemia regimen as needed. -Patient will be treated for underlying gout with the use of prednisone; anticipating CBGs to get elevated.  Hyponatremia/hypochloremia -In the setting of chronic diuretic usage and chronic renal failure. -Continue to follow electrolytes trend -Repeat basic metabolic panel in the morning -Continue to maintain adequate hydration.  Subjective:  Patient reports experiencing nausea/vomiting and also left ankle pain.  Concern for GERD and gout.  Reports no chest pain, no shortness of breath, no palpitations, no focal weakness.  Overall blood pressure and vital signs stable.  Hopefully discharge tomorrow.  Physical Exam: Vitals:   10/01/21 0100 10/01/21 0447 10/01/21 0805 10/01/21 1404  BP: (!) 151/42 (!) 176/52 (!) 168/48 (!) 165/35  Pulse: (!) 54 (!) 58  (!) 50  Resp: '20 18  17  '$ Temp: 98.6 F (37 C) 98.9 F (37.2 C)  98.1 F (36.7 C)  TempSrc: Oral Oral  Oral  SpO2: 98% 100%  100%  Weight:      Height:       General exam: Alert, awake, oriented x 3; no chest pain, no palpitations, no shortness of breath.  Reporting nausea, vomiting and left ankle pain. Respiratory system: Good air movement bilaterally; no using accessory muscles.  Good saturation on room air. Cardiovascular system: Rate controlled, no rubs, no gallops, no JVD. Gastrointestinal system: Abdomen is nondistended, soft and nontender. No organomegaly or masses felt. Normal bowel sounds  heard. Central nervous system: Alert and oriented. No focal neurological deficits. Extremities: No cyanosis or clubbing. Skin: No petechiae. Psychiatry: Judgement and insight appear normal. Mood & affect appropriate.    Data Reviewed: Basic metabolic panel: Sodium 366, potassium 4.1, chloride 98, bicarb 22, BUN 65, creatinine 2.47; GFR 19. Magnesium: 1.9  Family Communication: No family at bedside.  Disposition: Status is: Inpatient Remains inpatient appropriate because: Still requiring IV medications and nitroglycerin drip to assist controlling blood pressure.   Planned Discharge Destination: Home; with home health services.   Author: Barton Dubois, MD 10/01/2021 3:45 PM  For on call review [email or web address].

## 2021-10-01 NOTE — TOC Progression Note (Signed)
Transition of Care [facility]) - Progression Note    Patient Details  Name: Crystal [name] MRN: [id number] Date of Birth: November 18, 1934  Transition of Care [facility]) CM/SW [place], [place] Phone Number: 10/01/2021, 3:37 PM  Clinical Narrative:    CSW spoke with pt about PT recommending HH PT and a rolling walker at D/C. Pt states she needs both set up. Pt requesting [place] with [facility]. CSW reached out to [place] with [facility] to give referral, awaiting response. CSW reached out to [facility] with [facility] to get pts RW ordered, awaiting response. CSW to request that [facility] PT and RW orders be placed. TOC to follow.  Expected Discharge Plan: Home/Self Care Barriers to Discharge: Continued Medical Work up  Expected Discharge Plan and Services Expected Discharge Plan: Home/Self Care In-house Referral: Clinical Social Work     Living arrangements for the past 2 months: Single Family Home                                       Social Determinants of Health (SDOH) Interventions    Readmission Risk Interventions    10/01/2021    8:08 AM 08/08/2020    2:48 PM  Readmission Risk Prevention Plan  Transportation Screening Complete Complete  HRI or Home Care Consult Complete   Social Work Consult for [name] Planning/Counseling Complete   Palliative Care Screening Not Applicable   Medication Review Press photographer) Complete Complete  HRI or [place]  Complete  SW Recovery Care/Counseling Consult  Complete  [place]  Not Applicable

## 2021-10-01 NOTE — TOC Initial Note (Signed)
Transition of Care [facility]) - Initial/Assessment Note    Patient Details  Name: Crystal [name] MRN: [id number] Date of Birth: 21-Sep-1934  Transition of Care [facility]) CM/SW Contact:    Salome Arnt, LCSW Phone Number: 10/01/2021, 8:11 AM  Clinical Narrative:  Pt admitted for hypertensive urgency.  Assessment completed due to high risk readmission score. Pt reports she lives alone and manages well. She is independent with ADLs and still drives. Pt is not currently active with home health services and does not feel that will be necessary at this time. TOC will continue to follow.                 Expected Discharge Plan: Home/Self Care Barriers to Discharge: Continued Medical Work up   Patient Goals and CMS Choice Patient states their goals for this hospitalization and ongoing recovery are:: return home   Choice offered to / list presented to : Patient  Expected Discharge Plan and Services Expected Discharge Plan: Home/Self Care In-house Referral: Clinical Social Work     Living arrangements for the past 2 months: Single Family Home                                      Prior Living Arrangements/Services Living arrangements for the past 2 months: Single Family Home Lives with:: Self Patient language and need for interpreter reviewed:: Yes Do you feel safe going back to the place where you live?: Yes      Need for Family Participation in Patient Care: No (Comment)     Criminal Activity/Legal Involvement Pertinent to Current Situation/Hospitalization: No - Comment as needed  Activities of Daily Living Home Assistive Devices/Equipment: Grab bars in shower, Shower chair with back ADL Screening (condition at time of admission) Patient's cognitive ability adequate to safely complete daily activities?: Yes Is the patient deaf or have difficulty hearing?: No Does the patient have difficulty seeing, even when wearing glasses/contacts?: No Does the patient have  difficulty concentrating, remembering, or making decisions?: No Patient able to express need for assistance with ADLs?: Yes Does the patient have difficulty dressing or bathing?: No Independently performs ADLs?: Yes (appropriate for developmental age) Does the patient have difficulty walking or climbing stairs?: No Weakness of Legs: None Weakness of Arms/Hands: None  Permission Sought/Granted                  Emotional Assessment     Affect (typically observed): Appropriate Orientation: : Oriented to Self, Oriented to Place, Oriented to  Time, Oriented to Situation Alcohol / Substance Use: Not Applicable Psych Involvement: No (comment)  Admission diagnosis:  Unstable angina ([place]) [I20.0] Hypertensive crisis [I16.9] Severe hypertension [I10] Patient Active Problem List   Diagnosis Date Noted   Hypertensive crisis 09/28/2021   CKD (chronic kidney disease), stage IV ([place]) 09/28/2021   Hypokalemia    Hyponatremia 08/01/2020   Ileus ([place])    Melena 07/12/2020   Iron deficiency anemia 07/12/2020   Constipation 07/12/2020   Pleural effusion    Acute kidney injury ([place])    Shortness of breath    Flash pulmonary edema (HCC)    Hypertensive urgency    AF (paroxysmal atrial fibrillation) ([place])    Statin myopathy 02/02/2020   Nonspecific abnormal electrocardiogram (ECG) (EKG)    Dysphagia 09/21/2019   Anemia 09/21/2019   Diarrhea 07/06/2019   Acute on chronic combined systolic and diastolic CHF (congestive heart failure) (  [place]) 02/28/2019   Acute respiratory failure with hypoxemia ([place]) 02/28/2019   Acute pulmonary edema (HCC)    Hyperlipidemia LDL goal <70    Acute renal failure superimposed on chronic kidney disease ([place])    CHF exacerbation ([place]) 02/27/2019   CAD (coronary artery disease) 02/27/2019   STEMI (ST elevation myocardial infarction) ([place]) 01/30/2019   NSTEMI (non-ST elevated myocardial infarction) ([place]) 01/30/2019   Non-ST elevation (NSTEMI) myocardial  infarction [facility])    Renal artery stenosis ([place]) 12/02/2017   Type 2 diabetes mellitus ([place]) 03/04/2011   Hyperlipidemia 03/04/2011   Resistant hypertension 03/04/2011   PCP:  Glenda Chroman, MD Pharmacy:   [facility] [place] [address] [place] [address] Phone: [phone] Fax: [phone]     Social Determinants of Health (SDOH) Interventions    Readmission Risk Interventions    10/01/2021    8:08 AM 08/08/2020    2:48 PM  Readmission Risk Prevention Plan  Transportation Screening Complete Complete  HRI or Home Care Consult Complete   Social Work Consult for [place] Planning/Counseling Complete   Palliative Care Screening Not Applicable   Medication Review Press photographer) Complete Complete  HRI or [place]  Complete  SW Recovery Care/Counseling Consult  Complete  Palliative Care Screening  Not [place]  Not Applicable

## 2021-10-01 NOTE — Evaluation (Signed)
Physical Therapy Evaluation Patient Details Name: Crystal [name] MRN: [id number] DOB: 31-Jul-1934 Today's Date: 10/01/2021  History of Present Illness  Crystal [name]  is a 87 y.o. female with medical history significant of iron deficiency anemia, Bell's palsy, history of breast cancer and right mastectomy, CAD with prior STEMI, status post prior angioplasty and stent placements, h/o chronic systolic heart failure, paroxysmal atrial fibrillation, stage IV chronic kidney disease, HTN, GERD, gout, history of squamous cell carcinoma of the skin, mixed hyperlipidemia, osteopenia, thyroid nodule, type 2 diabetes mellitus (diet-controlled) presents to the ED with persistently elevated blood pressures despite compliance with antihypertensive regimen   Clinical Impression  Patient demonstrates labored movement for sitting up at bedside having to use bed rail to complete supine to sitting, has to lean on armrest of chair during transfers without AD mostly due to discomfort pain in heels, safer using RW for transfers and ambulation demonstrating slightly labored cadence without loss of balance and limited for ambulation mostly due to fatigue and bilateral feet discomfort.  Patient tolerated sitting up in chair after therapy.  Patient will benefit from continued skilled physical therapy in hospital and recommended venue below to increase strength, balance, endurance for safe ADLs and gait.         Recommendations for follow up therapy are one component of a multi-disciplinary discharge planning process, led by the attending physician.  Recommendations may be updated based on patient status, additional functional criteria and insurance authorization.  Follow Up Recommendations Home health PT      Assistance Recommended at Discharge Set up Supervision/Assistance  Patient can return home with the following  A little help with walking and/or transfers;A little help with bathing/dressing/bathroom;Help with  stairs or ramp for entrance;Assistance with cooking/housework    Equipment Recommendations Rolling walker (2 wheels)  Recommendations for Other Services       Functional Status Assessment Patient has had a recent decline in their functional status and demonstrates the ability to make significant improvements in function in a reasonable and predictable amount of time.     Precautions / Restrictions Precautions Precautions: Fall Restrictions Weight Bearing Restrictions: No      Mobility  Bed Mobility Overal bed mobility: Needs Assistance Bed Mobility: Supine to Sit     Supine to sit: Supervision     General bed mobility comments: slow labored movement requiring use of bed rail    Transfers Overall transfer level: Needs assistance Equipment used: None, Rolling walker (2 wheels) Transfers: Sit to/from Stand, Bed to chair/wheelchair/BSC Sit to Stand: Supervision, Min guard   Step pivot transfers: Supervision, Min guard       General transfer comment: labored movement having to lean on armrest of chair when transferring without AD, required use of RW for safety    Ambulation/Gait Ambulation/Gait assistance: Min guard Gait Distance (Feet): 65 Feet Assistive device: Rolling walker (2 wheels) Gait Pattern/deviations: Decreased step length - right, Decreased step length - left, Decreased stride length, Antalgic Gait velocity: decreased     General Gait Details: slow labored antalgic gait on bilateral feet due to c/o heel pain, required the use of RW for safety, fair/good return for demonstrated for using RW without loss of balance and limited mostly due to fatigue and discomfort in feet  Stairs            Wheelchair Mobility    Modified Rankin (Stroke Patients Only)       Balance Overall balance assessment: Needs assistance Sitting-balance support: Feet supported, No upper extremity  supported Sitting balance-Leahy Scale: Good Sitting balance - Comments:  seated at EOB   Standing balance support: During functional activity, No upper extremity supported Standing balance-Leahy Scale: Poor Standing balance comment: fair/good using RW                             Pertinent Vitals/Pain Pain Assessment Pain Assessment: Faces Faces Pain Scale: Hurts a little bit Pain Location: bilateral heels Pain Descriptors / Indicators: Sore, Guarding Pain Intervention(s): Limited activity within patient's tolerance, Monitored during session, Repositioned    Home Living Family/patient expects to be discharged to:: Private residence Living Arrangements: Alone Available Help at Discharge: Family;Available 24 hours/day Type of Home: House Home Access: Stairs to enter Entrance Stairs-Rails: None Entrance Stairs-Number of Steps: 1   Home Layout: One level Home Equipment: Shower seat;Grab bars - tub/shower Additional Comments: Patient planning to stay with her neice for a few days after discharge    Prior Function Prior Level of Function : Independent/Modified Independent             Mobility Comments: Community ambulator without AD, drives ADLs Comments: Independent     Hand Dominance        Extremity/Trunk Assessment   Upper Extremity Assessment Upper Extremity Assessment: Overall WFL for tasks assessed    Lower Extremity Assessment Lower Extremity Assessment: Generalized weakness    Cervical / Trunk Assessment Cervical / Trunk Assessment: Kyphotic  Communication   Communication: No difficulties  Cognition Arousal/Alertness: Awake/alert Behavior During Therapy: WFL for tasks assessed/performed Overall Cognitive Status: Within Functional Limits for tasks assessed                                          General Comments      Exercises     Assessment/Plan    PT Assessment Patient needs continued PT services  PT Problem List Decreased strength;Decreased activity tolerance;Decreased  balance;Decreased mobility;Pain       PT Treatment Interventions DME instruction;Gait training;Stair training;Functional mobility training;Therapeutic activities;Therapeutic exercise;Patient/family education;Balance training    PT Goals (Current goals can be found in the Care Plan section)  Acute Rehab PT Goals Patient Stated Goal: return home with family to assist PT Goal Formulation: With patient Time For Goal Achievement: 10/04/21 Potential to Achieve Goals: Good    Frequency Min 3X/week     Co-evaluation               AM-PAC PT "6 Clicks" Mobility  Outcome Measure Help needed turning from your back to your side while in a flat bed without using bedrails?: None Help needed moving from lying on your back to sitting on the side of a flat bed without using bedrails?: A Little Help needed moving to and from a bed to a chair (including a wheelchair)?: A Little Help needed standing up from a chair using your arms (e.g., wheelchair or bedside chair)?: A Little Help needed to walk in hospital room?: A Little Help needed climbing 3-5 steps with a railing? : A Lot 6 Click Score: 18    End of Session   Activity Tolerance: Patient tolerated treatment well;Patient limited by fatigue;Patient limited by pain Patient left: in chair;with call bell/phone within reach Nurse Communication: Mobility status PT Visit Diagnosis: Unsteadiness on feet (R26.81);Other abnormalities of gait and mobility (R26.89);Muscle weakness (generalized) (M62.81)    Time: 2595-6387 PT  Time Calculation (min) (ACUTE ONLY): 22 min   Charges:   PT Evaluation $PT Eval Moderate Complexity: 1 Mod PT Treatments $Therapeutic Activity: 8-22 mins        3:47 PM, 10/01/21 Lonell Grandchild, MPT Physical Therapist with [facility] [phone] office [phone] mobile phone

## 2021-10-01 NOTE — Consult Note (Signed)
Consultation Note Date: 10/01/2021   Patient Name: Crystal [name]  DOB: [birth year]  MRN: [id number]  Age / Sex: 87 y.o., female  PCP: Crystal Chroman, MD Referring Physician: Barton Dubois, MD  Reason for Consultation:  goals of care, advance directives  HPI/Patient Profile: 87 y.o. female  with past medical history of iron deficiency anemia, breast cancer treated with right mastectomy, CAD with history of STEMI, chronic systolic heart failure, A-fib, stage IV kidney disease, hypertension, GERD, gout, history of squamous cell carcinoma to the skin, osteopenia, thyroid nodule, type 2 diabetes, admitted on 09/28/2021 with hypertensive crisis.  Palliative medicine consulted for Goals of care and advanced directives.  Primary Decision Maker PATIENT  Discussion: I have reviewed medical records including Care Everywhere, progress notes from this and prior admissions, labs and imaging, discussed with RN.  On evaluation patient is awake, alert, oriented and able to participate in goals of care discussion her niece is also at the bedside and provides additional history.  I introduced Palliative Medicine as specialized medical care for people living with serious illness. It focuses on providing relief from the symptoms and stress of a serious illness. The goal is to improve quality of life for both the patient and the family.  Prior to this admission Crystal [name] was living at home alone and independent with all of her ADLs.  She recently had a brief stay at a nursing facility as she was recovering from an appendectomy.  She does note some decline in her functional status and says that her heart attacks were a cake walk compared to recovering from her appendectomy.  Advance directives, concepts specific to code status, artificial feeding and hydration, and rehospitalization were considered and discussed.  Crystal [name]  has advanced directives and a living will completed and a dedicated healthcare power of attorney should she not be able to make her own decisions-her dedicated power of attorney is her niece Crystal [name], her nephew Crystal [name], and her other niece Crystal [name].  These are listed under patient contacts.  I requested that she provide a copy of these advance directives to her provider and also bring them with her to the hospital should she need readmission.  She has a MOST form on file and this was reviewed with her-her selections are as follows- -DNR -Limited additional interventions -Antibiotics if indicated -IV fluids if indicated -No feeding tube  Her current goals of care are to stabilize and return to live with her nephew.  She wishes to continue ongoing care as long as she has which she values his quality of life which is being able to interact with her family.  She would not want prolonged artificial life support for prolonged aggressive medical interventions in the face of incurable illness.  Discussed with patient/family the importance of continued conversation with family and the medical providers regarding overall plan of care and treatment options, ensuring decisions are within the context of the patient's values and GOCs.    Palliative Care services outpatient were explained and  offered.  She is in agreement with referral for outpatient palliative.   SUMMARY OF RECOMMENDATIONS -DNR -Continue current plan -TOC referral for outpatient palliative support  Code Status/Advance Care Planning: DNR   Prognosis:   Unable to determine  Discharge Planning: Nephew's home with PT OT and palliative  Primary Diagnoses: Present on Admission:  Hypertensive crisis  CKD (chronic kidney disease), stage IV (HCC)  AF (paroxysmal atrial fibrillation) ([place])  Anemia  CAD (coronary artery disease)   Review of Systems  Constitutional:  Positive for activity change and appetite change.   Respiratory:  Negative for chest tightness and shortness of breath.   Musculoskeletal:  Positive for arthralgias.  Psychiatric/Behavioral:  Negative for confusion.     Physical Exam Vitals and nursing note reviewed.  Constitutional:      Comments: Frail  Neurological:     Mental Status: She is alert and oriented to person, place, and time.  Psychiatric:        Mood and Affect: Mood normal.        Behavior: Behavior normal.        Thought Content: Thought content normal.        Judgment: Judgment normal.     Vital Signs: BP (!) 168/48   Pulse (!) 58   Temp 98.9 F (37.2 C) (Oral)   Resp 18   Ht '5\' 6"'$  (1.676 m)   Wt 52.4 kg   SpO2 100%   BMI 18.65 kg/m  Pain Scale: 0-10 POSS *See Group Information*: 1-Acceptable,Awake and alert Pain Score: 4    SpO2: SpO2: 100 % O2 Device:SpO2: 100 % O2 Flow Rate: .   IO: Intake/output summary:  Intake/Output Summary (Last 24 hours) at 10/01/2021 1313 Last data filed at 10/01/2021 0453 Gross per 24 hour  Intake 480 ml  Output 284 ml  Net 196 ml    LBM: Last BM Date : 10/01/21 Baseline Weight: Weight: 52.2 kg Most recent weight: Weight: 52.4 kg       Thank you for this consult. Palliative medicine will continue to follow and assist as needed.   Greater than 50%  of this time was spent counseling and coordinating care related to the above assessment and plan.  Signed by: Crystal [name], AGNP-C Palliative Medicine    Please contact Palliative Medicine Team phone at [phone] for questions and concerns.  For individual provider: See Shea Evans

## 2021-10-01 NOTE — Plan of Care (Signed)
  Problem: Pain Managment: Goal: General experience of comfort will improve Outcome: Not Progressing Note: Pt having new onset left heel pain, previously reported as ankle pain.  Pain present on heel. No breaks in skin integrity, no obvious injury.  Pt denies falls or other possible sources of pain.  Pt states pain is sharp and began about the time she was admitted, had not previously been present.  No obvious deformities or swelling.  Dr. Dyann Kief aware for follow up during next visit.  Pt given tylenol and cream for comfort.

## 2021-10-01 NOTE — Assessment & Plan Note (Signed)
-   Most likely associated with GERD -PPI dose has been adjusted -Continue antiemetics and supportive care -Follow clinical response.

## 2021-10-02 DIAGNOSIS — I169 Hypertensive crisis, unspecified: Secondary | ICD-10-CM | POA: Diagnosis not present

## 2021-10-02 LAB — OSMOLALITY: Osmolality: 287 mOsm/kg (ref 275–295)

## 2021-10-02 LAB — BASIC METABOLIC PANEL
Anion gap: 10 (ref 5–15)
Anion gap: 11 (ref 5–15)
BUN: 73 mg/dL — ABNORMAL HIGH (ref 8–23)
BUN: 75 mg/dL — ABNORMAL HIGH (ref 8–23)
CO2: 19 mmol/L — ABNORMAL LOW (ref 22–32)
CO2: 22 mmol/L (ref 22–32)
Calcium: 8.7 mg/dL — ABNORMAL LOW (ref 8.9–10.3)
Calcium: 8.9 mg/dL (ref 8.9–10.3)
Chloride: 96 mmol/L — ABNORMAL LOW (ref 98–111)
Chloride: 91 mmol/L — ABNORMAL LOW (ref 98–111)
Creatinine, Ser: 2.68 mg/dL — ABNORMAL HIGH (ref 0.44–1.00)
Creatinine, Ser: 2.99 mg/dL — ABNORMAL HIGH (ref 0.44–1.00)
GFR, Estimated: 17 mL/min — ABNORMAL LOW (ref >60)
GFR, Estimated: 15 mL/min — ABNORMAL LOW (ref >60)
Glucose, Bld: 96 mg/dL (ref 70–99)
Glucose, Bld: 176 mg/dL — ABNORMAL HIGH (ref 70–99)
Potassium: 5.6 mmol/L — ABNORMAL HIGH (ref 3.5–5.1)
Potassium: 4.4 mmol/L (ref 3.5–5.1)
Sodium: 125 mmol/L — ABNORMAL LOW (ref 135–145)
Sodium: 124 mmol/L — ABNORMAL LOW (ref 135–145)

## 2021-10-02 LAB — TSH: TSH: 0.662 u[IU]/mL (ref 0.350–4.500)

## 2021-10-02 LAB — SODIUM, URINE, RANDOM: Sodium, Ur: 22 mmol/L

## 2021-10-02 LAB — OSMOLALITY, URINE: Osmolality, Ur: 291 mOsm/kg — ABNORMAL LOW (ref 300–900)

## 2021-10-02 MED ORDER — SODIUM ZIRCONIUM CYCLOSILICATE 10 G PO PACK
10.0000 g | PACK | Freq: Once | ORAL | Status: AC
  Administered 2021-10-02: 10 g via ORAL
  Filled 2021-10-02: qty 1

## 2021-10-02 NOTE — Progress Notes (Signed)
PROGRESS NOTE    Crystal [name]  KNL:[id number] DOB: [birth year] DOA: 09/28/2021 PCP: Glenda Chroman, MD   Brief Narrative:    Crystal [name]  is a 87 y.o. female with medical history significant of iron deficiency anemia, Bell's palsy, history of breast cancer and right mastectomy, CAD with prior STEMI, status post prior angioplasty and stent placements, h/o chronic systolic heart failure, paroxysmal atrial fibrillation, stage IV chronic kidney disease, HTN, GERD, gout, history of squamous cell carcinoma of the skin, mixed hyperlipidemia, osteopenia, thyroid nodule, type 2 diabetes mellitus (diet-controlled) presents to the ED with persistently elevated blood pressures despite compliance with antihypertensive regimen.  She was admitted with hypertensive crisis which she initially required nitroglycerin drip for management, but now blood pressures have improved.  She did develop some left ankle acute gout during the course of her stay.  She was also noted to have some significant electrolyte abnormalities that prolonged her stay.  Assessment & Plan:   Principal Problem:   Hypertensive crisis Active Problems:   CKD (chronic kidney disease), stage IV (HCC)   Type 2 diabetes mellitus (HCC)   CAD (coronary artery disease)   Anemia   AF (paroxysmal atrial fibrillation) (HCC)   Gout   Nausea & vomiting  Assessment and Plan:   Hypertensive crisis-resolved -Patient reporting shortness of breath and pain around the scapular area while experiencing elevated blood pressure. -Requiring nitroglycerin drip along with adjusted home antihypertensive agents to further control blood pressure -Continue to follow vital signs while off blood pressure control and drip and continue adjusting antihypertensive treatment. -Heart healthy diet discussed with patient -Instructed to maintain adequate hydration -Continue to follow-up renal function and electrolytes. -2D echo demonstrating preserved ejection  fraction, 60-65%, no regional wall motion abnormalities, normal diastolic parameters appreciated.  No significant valvular disorder. -Patient blood pressure stable overall; experiencing intermittent episode of nausea/vomiting that will require further evaluation prior to discharge. -Heart healthy diet once again discussed with patient.   CKD (chronic kidney disease), stage IV (HCC) -Appears to be stable and currently at baseline -Continue to follow renal function trend and electrolytes stability. -Continue to minimize as much as possible nephrotoxic agents, the use of contrast, avoiding hypotension and maintaining adequate hydration.   Nausea & vomiting-resolved - Most likely associated with GERD -PPI dose has been adjusted -Continue antiemetics and supportive care -Follow clinical response.   Acute gout-improving - Complaining of left ankle/heel pain -Checking uric acid level -Empirical treatment with prednisone for 5 days -Continue topical Voltaren gel and maintain adequate hydration   AF (paroxysmal atrial fibrillation) (HCC) -Rate controlled currently -Continue the use of amiodarone and beta-blocker. -Continue Eliquis for secondary prevention. -Continue telemetry monitoring.   Anemia -History of chronic anemia in the setting of chronic kidney disease stage IV -Hemoglobin overall stable and no signs of overt bleeding currently. -Continue to follow hemoglobin trend. -Patient is actively following with nephrology as an outpatient; IV iron and Epogen therapy as per nephrology discretion.   CAD (coronary artery disease) -Negative high sensitive troponin x2 -After improvement in her blood pressure levels she reports no shortness of breath sensation and denies chest pain. -Continue the use of beta-blocker and continue the use of Eliquis. -Continue outpatient follow-up with cardiology service. -Heart healthy diet discussed with patient.   Type 2 diabetes mellitus ([place]) -Patient  with type 2 diabetes with nephropathy -Continue to follow CBGs and adjust hypoglycemia regimen as needed. -Patient will be treated for underlying gout with the use of prednisone; anticipating CBGs to  get elevated.   Hyponatremia/hypochloremia -In the setting of chronic diuretic usage and chronic renal failure. -Continue to follow electrolytes trend -Repeat basic metabolic panel in the morning -Continue to maintain adequate hydration. -Fluid restrict and check urine and serum osmolarity  -TSH 0.66 -Urine sodium 22   Hyperkalemia -Administer Lokelma and reevaluate later today   DVT prophylaxis: Eliquis Code Status: DNR Family Communication: None at bedside Disposition Plan:  Status is: Inpatient Remains inpatient appropriate because: Need for IV medications and close monitoring  Consultants:  Palliative care  Procedures:  None  Antimicrobials:  None   Subjective: Patient seen and evaluated today with no new acute complaints or concerns. No acute concerns or events noted overnight.  She states that her left ankle pain has improved.  Objective: Vitals:   10/01/21 1720 10/01/21 2000 10/02/21 0446 10/02/21 0831  BP: (!) 162/48 (!) 149/42 (!) 150/38 (!) 184/41  Pulse:  (!) 52 (!) 56   Resp:  16 16   Temp:  98.6 F (37 C) 98.8 F (37.1 C)   TempSrc:      SpO2:  98% 97%   Weight:      Height:        Intake/Output Summary (Last 24 hours) at 10/02/2021 1112 Last data filed at 10/02/2021 0650 Gross per 24 hour  Intake 423 ml  Output 650 ml  Net -227 ml   Filed Weights   09/28/21 1449 09/28/21 1453 09/30/21 0426  Weight: 52.2 kg 52.2 kg 52.4 kg    Examination:  General exam: Appears calm and comfortable  Respiratory system: Clear to auscultation. Respiratory effort normal. Cardiovascular system: S1 & S2 heard, RRR.  Gastrointestinal system: Abdomen is soft Central nervous system: Alert and awake Extremities: No edema Skin: No significant lesions  noted Psychiatry: Flat affect.    Data Reviewed: I have personally reviewed following labs and imaging studies  CBC: Recent Labs  Lab 09/28/21 1518 09/29/21 0337  WBC 6.7 8.7  NEUTROABS 4.4  --   HGB 10.0* 9.4*  HCT 29.2* 28.2*  MCV 81.1 83.2  PLT 295 268   Basic Metabolic Panel: Recent Labs  Lab 09/28/21 1518 09/29/21 0337 10/01/21 0425 10/02/21 0350  NA 128* 130* 128* 125*  K 4.1 3.9 4.1 5.6*  CL 95* 100 98 96*  CO2 '23 23 22 '$ 19*  GLUCOSE 160* 107* 91 96  BUN 81* 79* 65* 73*  CREATININE 2.71* 2.60* 2.47* 2.68*  CALCIUM 9.1 8.9 8.6* 8.7*  MG  --   --  1.9  --    GFR: Estimated Creatinine Clearance: 12.9 mL/min (A) (by C-G formula based on SCr of 2.68 mg/dL (H)). Liver Function Tests: Recent Labs  Lab 09/28/21 1518  AST 39  ALT 35  ALKPHOS 64  BILITOT 0.9  PROT 6.8  ALBUMIN 3.9   No results for input(s): "LIPASE", "AMYLASE" in the last 168 hours. No results for input(s): "AMMONIA" in the last 168 hours. Coagulation Profile: Recent Labs  Lab 09/28/21 1518  INR 1.2   Cardiac Enzymes: No results for input(s): "CKTOTAL", "CKMB", "CKMBINDEX", "TROPONINI" in the last 168 hours. BNP (last 3 results) No results for input(s): "PROBNP" in the last 8760 hours. HbA1C: No results for input(s): "HGBA1C" in the last 72 hours. CBG: Recent Labs  Lab 09/28/21 1525 09/28/21 2211  GLUCAP 144* 98   Lipid Profile: No results for input(s): "CHOL", "HDL", "LDLCALC", "TRIG", "CHOLHDL", "LDLDIRECT" in the last 72 hours. Thyroid Function Tests: Recent Labs    10/02/21 [phone]  TSH 0.662   Anemia Panel: No results for input(s): "VITAMINB12", "FOLATE", "FERRITIN", "TIBC", "IRON", "RETICCTPCT" in the last 72 hours. Sepsis Labs: No results for input(s): "PROCALCITON", "LATICACIDVEN" in the last 168 hours.  Recent Results (from the past 240 hour(s))  MRSA Next Gen by PCR, Nasal     Status: None   Collection Time: 09/28/21  9:52 PM   Specimen: Nasal Mucosa; Nasal Swab   Result Value Ref Range Status   MRSA by PCR Next Gen NOT DETECTED NOT DETECTED Final    Comment: (NOTE) The GeneXpert MRSA Assay (FDA approved for NASAL specimens only), is one component of a comprehensive MRSA colonization surveillance program. It is not intended to diagnose MRSA infection nor to guide or monitor treatment for MRSA infections. Test performance is not FDA approved in patients less than 60 years old. Performed at [facility], [address] [place], [place] [address]          Radiology Studies: No results found.      Scheduled Meds:  amiodarone  200 mg Oral Daily   amLODipine  10 mg Oral Daily   apixaban  2.5 mg Oral BID   busPIRone  5 mg Oral TID   carvedilol  12.5 mg Oral BID WC   ferrous sulfate  325 mg Oral QAC lunch   hydrALAZINE  100 mg Oral TID   isosorbide mononitrate  120 mg Oral QHS   multivitamin-lutein  1 capsule Oral QAC lunch   pantoprazole  40 mg Oral BID AC   predniSONE  20 mg Oral Q breakfast   senna-docusate  2 tablet Oral QHS   sodium chloride flush  3 mL Intravenous Q12H   sodium chloride flush  3 mL Intravenous Q12H   torsemide  20 mg Oral Daily   Continuous Infusions:  sodium chloride Stopped (09/29/21 2322)   nitroGLYCERIN Stopped (09/29/21 1227)     LOS: 4 days    Time spent: 35 minutes    Inri Sobieski Darleen Crocker, DO [facility]  If 7PM-7AM, please contact night-coverage [email or web address] 10/02/2021, 11:12 AM

## 2021-10-02 NOTE — Progress Notes (Signed)
Physical Therapy Treatment Patient Details Name: Crystal [name] MRN: [id number] DOB: Jul 12, 1934 Today's Date: 10/02/2021   History of Present Illness Crystal [name]  is a 87 y.o. female with medical history significant of iron deficiency anemia, Bell's palsy, history of breast cancer and right mastectomy, CAD with prior STEMI, status post prior angioplasty and stent placements, h/o chronic systolic heart failure, paroxysmal atrial fibrillation, stage IV chronic kidney disease, HTN, GERD, gout, history of squamous cell carcinoma of the skin, mixed hyperlipidemia, osteopenia, thyroid nodule, type 2 diabetes mellitus (diet-controlled) presents to the ED with persistently elevated blood pressures despite compliance with antihypertensive regimen    PT Comments    Pt supine in bed and reports great breakfast, pt willing to participate with PT today.  Pt independent with bed mobility, able to put on socks on EOB with no assistance.  Multiple attempts trial to standing, cueing for hand placement on bed vs pulling walking and able to stand min A.  Pt able to ambulate 53f with RW for stability, slow labored movements though no LOB.  EOS pt left in chair with call bell within reach, RN aware of status.  No reports of pain through session, was limited by fatigue.    Recommendations for follow up therapy are one component of a multi-disciplinary discharge planning process, led by the attending physician.  Recommendations may be updated based on patient status, additional functional criteria and insurance authorization.  Follow Up Recommendations  Home health PT     Assistance Recommended at Discharge Set up Supervision/Assistance  Patient can return home with the following A little help with walking and/or transfers;A little help with bathing/dressing/bathroom;Help with stairs or ramp for entrance;Assistance with cooking/housework   Equipment Recommendations  Rolling walker (2 wheels)     Recommendations for Other Services       Precautions / Restrictions Precautions Precautions: Fall Restrictions Weight Bearing Restrictions: No     Mobility  Bed Mobility Overal bed mobility: Independent                  Transfers Overall transfer level: Modified independent Equipment used: Rolling walker (2 wheels) Transfers: Sit to/from Stand Sit to Stand: Min guard           General transfer comment: Cueing for handplacement for safely STS, unsteady upon standing, required RW for safety    Ambulation/Gait Ambulation/Gait assistance: Min guard Gait Distance (Feet): 65 Feet Assistive device: Rolling walker (2 wheels) Gait Pattern/deviations: Decreased step length - right, Decreased step length - left, Decreased stride length, Antalgic       General Gait Details: slow labored gait, use of RW for safety.  No LOB, limited by fatigue no reports of pain today   Stairs             Wheelchair Mobility    Modified Rankin (Stroke Patients Only)       Balance                                            Cognition Arousal/Alertness: Awake/alert Behavior During Therapy: WFL for tasks assessed/performed Overall Cognitive Status: Within Functional Limits for tasks assessed                                          Exercises  General Exercises - Lower Extremity Long Arc Quad: Both, 10 reps, Seated Hip ABduction/ADduction: AROM, Both, Seated Hip Flexion/Marching: AROM, Both, 10 reps, Seated Toe Raises: AROM, Both, 10 reps, Seated Heel Raises: AROM, Both, 10 reps, Seated    General Comments        Pertinent Vitals/Pain Pain Assessment Pain Assessment: No/denies pain    Home Living                          Prior Function            PT Goals (current goals can now be found in the care plan section)      Frequency           PT Plan      Co-evaluation              AM-PAC PT "6  Clicks" Mobility   Outcome Measure  Help needed turning from your back to your side while in a flat bed without using bedrails?: None Help needed moving from lying on your back to sitting on the side of a flat bed without using bedrails?: A Little Help needed moving to and from a bed to a chair (including a wheelchair)?: A Little Help needed standing up from a chair using your arms (e.g., wheelchair or bedside chair)?: A Little Help needed to walk in hospital room?: A Little Help needed climbing 3-5 steps with a railing? : A Lot 6 Click Score: 18    End of Session Equipment Utilized During Treatment: Gait belt Activity Tolerance: Patient tolerated treatment well;Patient limited by fatigue Patient left: in chair;with call bell/phone within reach Nurse Communication: Mobility status PT Visit Diagnosis: Unsteadiness on feet (R26.81);Other abnormalities of gait and mobility (R26.89);Muscle weakness (generalized) (M62.81)     Time: 1610-9604 PT Time Calculation (min) (ACUTE ONLY): 30 min  Charges:  $Therapeutic Activity: 23-37 mins                    Ihor Austin, LPTA/CLT; CBIS [phone]  Aldona Lento 10/02/2021, 9:37 AM

## 2021-10-03 DIAGNOSIS — I169 Hypertensive crisis, unspecified: Secondary | ICD-10-CM | POA: Diagnosis not present

## 2021-10-03 LAB — BASIC METABOLIC PANEL
Anion gap: 9 (ref 5–15)
BUN: 81 mg/dL — ABNORMAL HIGH (ref 8–23)
CO2: 24 mmol/L (ref 22–32)
Calcium: 8.6 mg/dL — ABNORMAL LOW (ref 8.9–10.3)
Chloride: 92 mmol/L — ABNORMAL LOW (ref 98–111)
Creatinine, Ser: 2.79 mg/dL — ABNORMAL HIGH (ref 0.44–1.00)
GFR, Estimated: 16 mL/min — ABNORMAL LOW (ref >60)
Glucose, Bld: 90 mg/dL (ref 70–99)
Potassium: 4.1 mmol/L (ref 3.5–5.1)
Sodium: 125 mmol/L — ABNORMAL LOW (ref 135–145)

## 2021-10-03 LAB — MAGNESIUM: Magnesium: 1.9 mg/dL (ref 1.7–2.4)

## 2021-10-03 MED ORDER — PREDNISONE 20 MG PO TABS: 20.0000 mg | ORAL_TABLET | Freq: Every day | ORAL | 0 refills | Status: AC

## 2021-10-03 MED ORDER — SODIUM CHLORIDE 1 G PO TABS: 1.0000 g | ORAL_TABLET | Freq: Two times a day (BID) | ORAL | 0 refills | Status: AC

## 2021-10-03 MED ORDER — AMLODIPINE BESYLATE 10 MG PO TABS: 10.0000 mg | ORAL_TABLET | Freq: Every day | ORAL | 0 refills | Status: DC

## 2021-10-03 NOTE — TOC Transition Note (Signed)
Transition of Care [facility]) - CM/SW Discharge Note   Patient Details  Name: Crystal [name] MRN: [id number] Date of Birth: [birth year]  [facility] CM/SW Contact:  Shade Flood, LCSW Phone Number: 10/03/2021, 10:47 AM   Clinical Narrative:     Pt stable for dc home today per MD. Plan remains for [facility] RN and PT from [place]. Pt will be discharging to her niece's home. TOC updated Cory at [place] of this information.   There are no other TOC needs for dc.  Final next level of care: [place] Barriers to Discharge: Barriers Resolved   Patient Goals and CMS Choice Patient states their goals for this hospitalization and ongoing recovery are:: return home   Choice offered to / list presented to : Patient  Discharge Placement                       Discharge Plan and Services In-house Referral: Clinical Social Work                        HH Arranged: Therapist, sports, PT [facility] Agency: [place] Date [place]: 10/03/21   Representative spoke with at [place]: [place] ([place]) Interventions     Readmission Risk Interventions    10/01/2021    8:08 AM 08/08/2020    2:48 PM  Readmission Risk Prevention Plan  Transportation Screening Complete Complete  HRI or [place] Complete   Social Work Consult for [place] Planning/Counseling Complete   Palliative Care Screening Not Applicable   Medication Review Press photographer) Complete Complete  HRI or [place]  Complete  SW Recovery Care/Counseling Consult  Complete  [place]  Not Applicable

## 2021-10-03 NOTE — Discharge Summary (Signed)
Physician Discharge Summary  Crystal [name] WOE:[id number] DOB: 29-Jan-1934 DOA: 09/28/2021  PCP: Glenda Chroman, MD  Admit date: 09/28/2021  Discharge date: 10/03/2021  Admitted From:Home  Disposition:  Home  Recommendations for Outpatient Follow-up:  Follow up with PCP in 3-5 days to repeat labs Follow-up with Dr. Theador Hawthorne as scheduled Instructed to fluid restrict at home and start sodium chloride tablets for the next 5 days and recheck labs with PCP given lower sodium levels than usual, but patient is asymptomatic Continue prednisone as prescribed for 2 more days for acute gout flare to left ankle Started on amlodipine 10 mg daily for better blood pressure control Continue other home medications as prior  Home Health: Yes with PT  Equipment/Devices: Rolling walker  Discharge Condition:Stable  CODE STATUS: DNR  Diet recommendation: Heart Healthy/carb modified  Brief/Interim Summary: Crystal [name]  is a 87 y.o. female with medical history significant of iron deficiency anemia, Bell's palsy, history of breast cancer and right mastectomy, CAD with prior STEMI, status post prior angioplasty and stent placements, h/o chronic systolic heart failure, paroxysmal atrial fibrillation, stage IV chronic kidney disease, HTN, GERD, gout, history of squamous cell carcinoma of the skin, mixed hyperlipidemia, osteopenia, thyroid nodule, type 2 diabetes mellitus (diet-controlled) presents to the ED with persistently elevated blood pressures despite compliance with antihypertensive regimen.  She was admitted with hypertensive crisis which she initially required nitroglycerin drip for management, but now blood pressures have improved.  She did develop some left ankle acute gout during the course of her stay.  She was also noted to have some significant electrolyte abnormalities that prolonged her stay.  This included some hyperkalemia as well as some acute on chronic hyponatremia, but she remained  asymptomatic from this.  Her potassium levels have corrected, but her sodium levels remain at about 125, but she is asymptomatic with no altered mentation, nausea, vomiting, weakness, or changes in appetite.  She feels at her usual baseline and has been assessed by PT with recommendations for home health PT which have been arranged.  She will remain on some sodium chloride tablets as well as fluid restriction as some of this appears to be related to SIADH and will need close follow-up with her lab work with PCP and nephrology.  No other acute events noted and she is in stable condition for discharge.  Blood pressures are better controlled with the addition of amlodipine.  Discharge Diagnoses:  Principal Problem:   Hypertensive crisis Active Problems:   CKD (chronic kidney disease), stage IV (HCC)   Type 2 diabetes mellitus (HCC)   CAD (coronary artery disease)   Anemia   AF (paroxysmal atrial fibrillation) (HCC)   Gout   Nausea & vomiting  Principal discharge diagnosis: Hypertensive crisis in the setting of stage IV CKD with electrolyte abnormalities to include acute on chronic hyponatremia possibly related to SIADH versus low sodium intake.  Acute left ankle gout flare-resolving.  Discharge Instructions  Discharge Instructions     Diet - low sodium heart healthy   Complete by: As directed    Increase activity slowly   Complete by: As directed       Allergies as of 10/03/2021       Reactions   Bactrim [sulfamethoxazole-trimethoprim] Nausea And Vomiting   Sulfa Antibiotics Nausea And Vomiting   Clonidine Derivatives Other (See Comments)   FATIGUE   Evista [raloxifene Hcl] Other (See Comments)   GERD   Fosamax [alendronate Sodium] Other (See Comments)   INCREASED GERD  Glipizide Other (See Comments)   PALPITATIONS   Lipitor [atorvastatin Calcium] Other (See Comments)   ACHING   Losartan Other (See Comments)   FATIGUE    Pravastatin Other (See Comments)   ACHING    Azithromycin Rash, Other (See Comments)   FACIAL BURNING         Medication List     TAKE these medications    acetaminophen 500 MG tablet Commonly known as: TYLENOL Take 500 mg by mouth every 6 (six) hours as needed for headache (pain).   amiodarone 200 MG tablet Commonly known as: PACERONE Take 200 mg by mouth daily.   amLODipine 10 MG tablet Commonly known as: NORVASC Take 1 tablet (10 mg total) by mouth daily. Start taking on: October 04, 2021   CALCIUM CARB-CHOLECALCIFEROL PO Take 1 tablet by mouth daily.   carvedilol 6.25 MG tablet Commonly known as: COREG Take 1 tablet (6.25 mg total) by mouth 2 (two) times daily.   cholecalciferol 25 MCG (1000 UNIT) tablet Commonly known as: VITAMIN D3 Take 1,000 Units by mouth daily with breakfast.   diazepam 2 MG tablet Commonly known as: VALIUM Take 2 mg by mouth 2 (two) times daily as needed for anxiety.   docusate sodium 100 MG capsule Commonly known as: COLACE Take 100 mg by mouth daily as needed for mild constipation.   Eliquis 2.5 MG Tabs tablet Generic drug: apixaban TAKE (1) TABLET TWICE DAILY. What changed: See the new instructions.   ferrous sulfate 325 (65 FE) MG tablet Take 325 mg by mouth daily before lunch.   GAS-X PO Take 1 capsule by mouth daily as needed (gas).   hydrALAZINE 100 MG tablet Commonly known as: APRESOLINE Take 100 mg by mouth in the morning, at noon, and at bedtime.   isosorbide mononitrate 120 MG 24 hr tablet Commonly known as: IMDUR TAKE 1 TABLET BY MOUTH AT BEDTIME. What changed: when to take this   loperamide 2 MG capsule Commonly known as: IMODIUM Take 2 mg by mouth as needed for diarrhea or loose stools.   meclizine 25 MG tablet Commonly known as: ANTIVERT Take 25 mg by mouth as needed for dizziness.   multivitamin with minerals Tabs tablet Take 1 tablet by mouth daily. Centrum Silver for Women   multivitamin-lutein Caps capsule Take 1 capsule by mouth daily  before lunch.   nitroGLYCERIN 0.4 MG SL tablet Commonly known as: NITROSTAT PLACE 1 TAB UNDER TONGUE EVERY 3 MINUTES AS DIRECTED UP TO 3 TIMES AS NEEDED FOR CHEST PAIN. What changed: See the new instructions.   potassium chloride 10 MEQ tablet Commonly known as: KLOR-CON Take 10 mEq by mouth daily.   predniSONE 20 MG tablet Commonly known as: DELTASONE Take 1 tablet (20 mg total) by mouth daily with breakfast for 2 days. Start taking on: October 04, 2021   saccharomyces boulardii 250 MG capsule Commonly known as: FLORASTOR Take 250 mg by mouth as needed (stomach issues).   sodium chloride 0.65 % Soln nasal spray Commonly known as: OCEAN Place 1 spray into both nostrils as needed for congestion.   sodium chloride 1 g tablet Take 1 tablet (1 g total) by mouth 2 (two) times daily with a meal for 5 days.   spironolactone 25 MG tablet Commonly known as: ALDACTONE Take 12.5 mg by mouth daily.   torsemide 20 MG tablet Commonly known as: DEMADEX Take 20 mg by mouth daily.  Durable Medical Equipment  (From admission, onward)           Start     Ordered   10/01/21 1551  For home use only DME Walker rolling  Once       Comments: Patient very unsteady on feet without AD and at risk for falls, safer using RW with fair/good carryover for use without loss of balance  Question Answer Comment  Walker: With 5 Inch Wheels   Patient needs a walker to treat with the following condition Gait difficulty      10/01/21 1551            Follow-up Information     Vyas, Dhruv B, MD. Schedule an appointment as soon as possible for a visit in 1 week(s).   Specialty: Internal Medicine Contact information: [place] [address] [phone]         Liana Gerold, MD. Go to.   Specialty: Nephrology Contact information: [address] [place] [address] [phone]                Allergies  Allergen Reactions   Bactrim  [Sulfamethoxazole-Trimethoprim] Nausea And Vomiting   Sulfa Antibiotics Nausea And Vomiting   Clonidine Derivatives Other (See Comments)    FATIGUE   Evista [Raloxifene Hcl] Other (See Comments)    GERD   Fosamax [Alendronate Sodium] Other (See Comments)    INCREASED GERD    Glipizide Other (See Comments)    PALPITATIONS   Lipitor [Atorvastatin Calcium] Other (See Comments)    ACHING   Losartan Other (See Comments)    FATIGUE    Pravastatin Other (See Comments)    ACHING   Azithromycin Rash and Other (See Comments)    FACIAL BURNING     Consultations: None   Procedures/Studies: ECHOCARDIOGRAM COMPLETE  Result Date: 09/29/2021    ECHOCARDIOGRAM REPORT   Patient Name:   Crystal [name] Date of Exam: 09/29/2021 Medical Rec #:  [id number]          Height:       66.0 in Accession #:    [id number]         Weight:       115.0 lb Date of Birth:  11-06-1934          BSA:          1.581 m Patient Age:    48 years           BP:           150/33 mmHg Patient Gender: F                  HR:           59 bpm. Exam Location:  [facility] Procedure: 2D Echo, Cardiac Doppler and Color Doppler Indications:    Abnormal ECG R94.31  History:        Patient has no prior history of Echocardiogram examinations.                 CHF, CAD, Arrythmias:Atrial Fibrillation; Risk                 Factors:Hypertension, Diabetes, Dyslipidemia and Non-Smoker.                 Breast cancer ([place]) (From Hx), STEMI, NSTEMI, PAD and CKD.  Sonographer:    Alvino Chapel RCS Referring Phys: [phone] COURAGE EMOKPAE IMPRESSIONS  1. Left ventricular ejection fraction, by estimation, is 60 to  65%. The left ventricle has normal function. The left ventricle has no regional wall motion abnormalities. Left ventricular diastolic parameters were normal.  2. Right ventricular systolic function is normal. The right ventricular size is normal. There is normal pulmonary artery systolic pressure.  3. The mitral valve is degenerative. Trivial mitral  valve regurgitation. No evidence of mitral stenosis. Moderate mitral annular calcification.  4. The aortic valve is tricuspid. There is mild calcification of the aortic valve. There is mild thickening of the aortic valve. Aortic valve regurgitation is mild. Aortic valve sclerosis/calcification is present, without any evidence of aortic stenosis.  5. The inferior vena cava is dilated in size with >50% respiratory variability, suggesting right atrial pressure of 8 mmHg. FINDINGS  Left Ventricle: Left ventricular ejection fraction, by estimation, is 60 to 65%. The left ventricle has normal function. The left ventricle has no regional wall motion abnormalities. The left ventricular internal cavity size was normal in size. There is  no left ventricular hypertrophy. Left ventricular diastolic parameters were normal. Right Ventricle: The right ventricular size is normal. No increase in right ventricular wall thickness. Right ventricular systolic function is normal. There is normal pulmonary artery systolic pressure. The tricuspid regurgitant velocity is 2.39 m/s, and  with an assumed right atrial pressure of 8 mmHg, the estimated right ventricular systolic pressure is 31.5 mmHg. Left Atrium: Left atrial size was normal in size. Right Atrium: Right atrial size was normal in size. Pericardium: There is no evidence of pericardial effusion. Mitral Valve: The mitral valve is degenerative in appearance. There is moderate thickening of the mitral valve leaflet(s). There is moderate calcification of the mitral valve leaflet(s). Moderate mitral annular calcification. Trivial mitral valve regurgitation. No evidence of mitral valve stenosis. Tricuspid Valve: The tricuspid valve is normal in structure. Tricuspid valve regurgitation is mild . No evidence of tricuspid stenosis. Aortic Valve: The aortic valve is tricuspid. There is mild calcification of the aortic valve. There is mild thickening of the aortic valve. Aortic valve  regurgitation is mild. Aortic valve sclerosis/calcification is present, without any evidence of aortic stenosis. Pulmonic Valve: The pulmonic valve was normal in structure. Pulmonic valve regurgitation is not visualized. No evidence of pulmonic stenosis. Aorta: The aortic root is normal in size and structure. Venous: The inferior vena cava is dilated in size with greater than 50% respiratory variability, suggesting right atrial pressure of 8 mmHg. IAS/Shunts: No atrial level shunt detected by color flow Doppler.  LEFT VENTRICLE PLAX 2D LVIDd:         4.70 cm   Diastology LVIDs:         2.10 cm   LV e' medial:    6.09 cm/s LV PW:         1.10 cm   LV E/e' medial:  20.5 LV IVS:        1.00 cm   LV e' lateral:   6.31 cm/s LVOT diam:     1.80 cm   LV E/e' lateral: 19.8 LV SV:         81 LV SV Index:   52 LVOT Area:     2.54 cm  RIGHT VENTRICLE RV S prime:     12.00 cm/s TAPSE (M-mode): 2.6 cm LEFT ATRIUM            Index        RIGHT ATRIUM           Index LA diam:      3.40 cm  2.15 cm/m  RA Area:     21.50 cm LA Vol (A2C): 120.0 ml 75.92 ml/m  RA Volume:   69.40 ml  43.90 ml/m LA Vol (A4C): 73.7 ml  46.63 ml/m  AORTIC VALVE LVOT Vmax:   123.00 cm/s LVOT Vmean:  83.000 cm/s LVOT VTI:    0.320 m  AORTA Ao Root diam: 3.20 cm MITRAL VALVE                TRICUSPID VALVE MV Area (PHT): 2.26 cm     TR Peak grad:   22.8 mmHg MV Decel Time: 335 msec     TR Vmax:        239.00 cm/s MV E velocity: 125.00 cm/s MV A velocity: 130.00 cm/s  SHUNTS MV E/A ratio:  0.96         Systemic VTI:  0.32 m                             Systemic Diam: 1.80 cm Jenkins Rouge MD Electronically signed by Jenkins Rouge MD Signature Date/Time: 09/29/2021/11:44:55 AM    Final    DG Chest Portable 1 View  Result Date: 09/28/2021 CLINICAL DATA:  Shortness of breath EXAM: PORTABLE CHEST 1 VIEW COMPARISON:  Previous studies including the examination of 07/02/2021 FINDINGS: Transverse diameter of heart is increased. Low position of diaphragms  suggests COPD. There are no signs of pulmonary edema or focal pulmonary consolidation. Coronary artery stent is noted overlying the left side of heart. There is no pleural effusion or pneumothorax. Surgical clips are seen in right chest wall. IMPRESSION: Cardiomegaly. COPD. Coronary artery disease. There are no signs of pulmonary edema or focal pulmonary consolidation. Electronically Signed   By: Elmer Picker M.D.   On: 09/28/2021 15:47     Discharge Exam: Vitals:   10/03/21 0533 10/03/21 0848  BP: (!) 149/41 (!) 159/37  Pulse: (!) 51 (!) 53  Resp: 17   Temp: 97.9 F (36.6 C)   SpO2: 98% 97%   Vitals:   10/02/21 2014 10/02/21 2227 10/03/21 0533 10/03/21 0848  BP: (!) 168/43 (!) 188/51 (!) 149/41 (!) 159/37  Pulse: (!) 57 (!) 57 (!) 51 (!) 53  Resp: 18  17   Temp: 97.8 F (36.6 C)  97.9 F (36.6 C)   TempSrc: Oral  Oral   SpO2: 97%  98% 97%  Weight:      Height:        General: Pt is alert, awake, not in acute distress Cardiovascular: RRR, S1/S2 +, no rubs, no gallops Respiratory: CTA bilaterally, no wheezing, no rhonchi Abdominal: Soft, NT, ND, bowel sounds + Extremities: no edema, no cyanosis    The results of significant diagnostics from this hospitalization (including imaging, microbiology, ancillary and laboratory) are listed below for reference.     Microbiology: Recent Results (from the past 240 hour(s))  MRSA Next Gen by PCR, Nasal     Status: None   Collection Time: 09/28/21  9:52 PM   Specimen: Nasal Mucosa; Nasal Swab  Result Value Ref Range Status   MRSA by PCR Next Gen NOT DETECTED NOT DETECTED Final    Comment: (NOTE) The GeneXpert MRSA Assay (FDA approved for NASAL specimens only), is one component of a comprehensive MRSA colonization surveillance program. It is not intended to diagnose MRSA infection nor to guide or monitor treatment for MRSA infections. Test performance is not FDA approved in patients less than 31 years old. Performed at  [facility] [place], [place] [address]      Labs: BNP (last 3 results) Recent Labs    09/28/21 1607  BNP 149.7*   Basic Metabolic Panel: Recent Labs  Lab 09/29/21 0337 10/01/21 0425 10/02/21 0350 10/02/21 1305 10/03/21 0428  NA 130* 128* 125* 124* 125*  K 3.9 4.1 5.6* 4.4 4.1  CL 100 98 96* 91* 92*  CO2 23 22 19* 22 24  GLUCOSE 107* 91 96 176* 90  BUN 79* 65* 73* 75* 81*  CREATININE 2.60* 2.47* 2.68* 2.99* 2.79*  CALCIUM 8.9 8.6* 8.7* 8.9 8.6*  MG  --  1.9  --   --  1.9   Liver Function Tests: Recent Labs  Lab 09/28/21 1518  AST 39  ALT 35  ALKPHOS 64  BILITOT 0.9  PROT 6.8  ALBUMIN 3.9   No results for input(s): "LIPASE", "AMYLASE" in the last 168 hours. No results for input(s): "AMMONIA" in the last 168 hours. CBC: Recent Labs  Lab 09/28/21 1518 09/29/21 0337  WBC 6.7 8.7  NEUTROABS 4.4  --   HGB 10.0* 9.4*  HCT 29.2* 28.2*  MCV 81.1 83.2  PLT 295 242   Cardiac Enzymes: No results for input(s): "CKTOTAL", "CKMB", "CKMBINDEX", "TROPONINI" in the last 168 hours. BNP: Invalid input(s): "POCBNP" CBG: Recent Labs  Lab 09/28/21 1525 09/28/21 2211  GLUCAP 144* 98   D-Dimer No results for input(s): "DDIMER" in the last 72 hours. Hgb A1c No results for input(s): "HGBA1C" in the last 72 hours. Lipid Profile No results for input(s): "CHOL", "HDL", "LDLCALC", "TRIG", "CHOLHDL", "LDLDIRECT" in the last 72 hours. Thyroid function studies Recent Labs    10/02/21 0836  TSH 0.662   Anemia work up No results for input(s): "VITAMINB12", "FOLATE", "FERRITIN", "TIBC", "IRON", "RETICCTPCT" in the last 72 hours. Urinalysis    Component Value Date/Time   COLORURINE YELLOW 08/02/2020 0947   APPEARANCEUR CLEAR 08/02/2020 0947   LABSPEC 1.009 08/02/2020 0947   PHURINE 5.0 08/02/2020 0947   GLUCOSEU NEGATIVE 08/02/2020 0947   HGBUR SMALL (A) 08/02/2020 [place] 08/02/2020 [place] 08/02/2020 0947    PROTEINUR 100 (A) 08/02/2020 0947   NITRITE NEGATIVE 08/02/2020 0947   LEUKOCYTESUR NEGATIVE 08/02/2020 0947   Sepsis Labs Recent Labs  Lab 09/28/21 1518 09/29/21 0337  WBC 6.7 8.7   Microbiology Recent Results (from the past 240 hour(s))  MRSA Next Gen by PCR, Nasal     Status: None   Collection Time: 09/28/21  9:52 PM   Specimen: Nasal Mucosa; Nasal Swab  Result Value Ref Range Status   MRSA by PCR Next Gen NOT DETECTED NOT DETECTED Final    Comment: (NOTE) The GeneXpert MRSA Assay (FDA approved for NASAL specimens only), is one component of a comprehensive MRSA colonization surveillance program. It is not intended to diagnose MRSA infection nor to guide or monitor treatment for MRSA infections. Test performance is not FDA approved in patients less than 85 years old. Performed at [facility], [address], [place], [place] [address]      Time coordinating discharge: 35 minutes  SIGNED:   Rodena Goldmann, DO [facility] 10/03/2021, 9:47 AM  If 7PM-7AM, please contact night-coverage [email or web address]

## 2021-10-03 NOTE — Care Management Important Message (Signed)
Important Message  Patient Details  Name: Crystal [name] MRN: [id number] Date of Birth: 11-24-1934   Medicare Important Message Given:  Yes     Tommy Medal 10/03/2021, 11:08 AM

## 2021-10-05 DIAGNOSIS — D509 Iron deficiency anemia, unspecified: Secondary | ICD-10-CM | POA: Diagnosis not present

## 2021-10-05 DIAGNOSIS — G510 Bell's palsy: Secondary | ICD-10-CM | POA: Diagnosis not present

## 2021-10-05 DIAGNOSIS — N184 Chronic kidney disease, stage 4 (severe): Secondary | ICD-10-CM | POA: Diagnosis not present

## 2021-10-05 DIAGNOSIS — E041 Nontoxic single thyroid nodule: Secondary | ICD-10-CM | POA: Diagnosis not present

## 2021-10-05 DIAGNOSIS — I5022 Chronic systolic (congestive) heart failure: Secondary | ICD-10-CM | POA: Diagnosis not present

## 2021-10-05 DIAGNOSIS — E871 Hypo-osmolality and hyponatremia: Secondary | ICD-10-CM | POA: Diagnosis not present

## 2021-10-05 DIAGNOSIS — D631 Anemia in chronic kidney disease: Secondary | ICD-10-CM | POA: Diagnosis not present

## 2021-10-05 DIAGNOSIS — M10072 Idiopathic gout, left ankle and foot: Secondary | ICD-10-CM | POA: Diagnosis not present

## 2021-10-05 DIAGNOSIS — Z9049 Acquired absence of other specified parts of digestive tract: Secondary | ICD-10-CM | POA: Diagnosis not present

## 2021-10-05 DIAGNOSIS — K219 Gastro-esophageal reflux disease without esophagitis: Secondary | ICD-10-CM | POA: Diagnosis not present

## 2021-10-05 DIAGNOSIS — I252 Old myocardial infarction: Secondary | ICD-10-CM | POA: Diagnosis not present

## 2021-10-05 DIAGNOSIS — E875 Hyperkalemia: Secondary | ICD-10-CM | POA: Diagnosis not present

## 2021-10-05 DIAGNOSIS — Z853 Personal history of malignant neoplasm of breast: Secondary | ICD-10-CM | POA: Diagnosis not present

## 2021-10-05 DIAGNOSIS — E782 Mixed hyperlipidemia: Secondary | ICD-10-CM | POA: Diagnosis not present

## 2021-10-05 DIAGNOSIS — Z7722 Contact with and (suspected) exposure to environmental tobacco smoke (acute) (chronic): Secondary | ICD-10-CM | POA: Diagnosis not present

## 2021-10-05 DIAGNOSIS — Z85828 Personal history of other malignant neoplasm of skin: Secondary | ICD-10-CM | POA: Diagnosis not present

## 2021-10-05 DIAGNOSIS — I2510 Atherosclerotic heart disease of native coronary artery without angina pectoris: Secondary | ICD-10-CM | POA: Diagnosis not present

## 2021-10-05 DIAGNOSIS — R112 Nausea with vomiting, unspecified: Secondary | ICD-10-CM | POA: Diagnosis not present

## 2021-10-05 DIAGNOSIS — Z955 Presence of coronary angioplasty implant and graft: Secondary | ICD-10-CM | POA: Diagnosis not present

## 2021-10-05 DIAGNOSIS — I130 Hypertensive heart and chronic kidney disease with heart failure and stage 1 through stage 4 chronic kidney disease, or unspecified chronic kidney disease: Secondary | ICD-10-CM | POA: Diagnosis not present

## 2021-10-05 DIAGNOSIS — E878 Other disorders of electrolyte and fluid balance, not elsewhere classified: Secondary | ICD-10-CM | POA: Diagnosis not present

## 2021-10-05 DIAGNOSIS — I480 Paroxysmal atrial fibrillation: Secondary | ICD-10-CM | POA: Diagnosis not present

## 2021-10-05 DIAGNOSIS — M8580 Other specified disorders of bone density and structure, unspecified site: Secondary | ICD-10-CM | POA: Diagnosis not present

## 2021-10-05 DIAGNOSIS — E1122 Type 2 diabetes mellitus with diabetic chronic kidney disease: Secondary | ICD-10-CM | POA: Diagnosis not present

## 2021-10-05 DIAGNOSIS — Z9011 Acquired absence of right breast and nipple: Secondary | ICD-10-CM | POA: Diagnosis not present

## 2021-10-07 DIAGNOSIS — I130 Hypertensive heart and chronic kidney disease with heart failure and stage 1 through stage 4 chronic kidney disease, or unspecified chronic kidney disease: Secondary | ICD-10-CM | POA: Diagnosis not present

## 2021-10-07 DIAGNOSIS — N184 Chronic kidney disease, stage 4 (severe): Secondary | ICD-10-CM | POA: Diagnosis not present

## 2021-10-07 DIAGNOSIS — E1122 Type 2 diabetes mellitus with diabetic chronic kidney disease: Secondary | ICD-10-CM | POA: Diagnosis not present

## 2021-10-07 DIAGNOSIS — M10072 Idiopathic gout, left ankle and foot: Secondary | ICD-10-CM | POA: Diagnosis not present

## 2021-10-07 DIAGNOSIS — I5022 Chronic systolic (congestive) heart failure: Secondary | ICD-10-CM | POA: Diagnosis not present

## 2021-10-07 DIAGNOSIS — I480 Paroxysmal atrial fibrillation: Secondary | ICD-10-CM | POA: Diagnosis not present

## 2021-10-08 ENCOUNTER — Telehealth: Payer: Self-pay | Admitting: *Deleted

## 2021-10-08 DIAGNOSIS — I130 Hypertensive heart and chronic kidney disease with heart failure and stage 1 through stage 4 chronic kidney disease, or unspecified chronic kidney disease: Secondary | ICD-10-CM | POA: Diagnosis not present

## 2021-10-08 DIAGNOSIS — M10072 Idiopathic gout, left ankle and foot: Secondary | ICD-10-CM | POA: Diagnosis not present

## 2021-10-08 DIAGNOSIS — N184 Chronic kidney disease, stage 4 (severe): Secondary | ICD-10-CM | POA: Diagnosis not present

## 2021-10-08 DIAGNOSIS — I480 Paroxysmal atrial fibrillation: Secondary | ICD-10-CM | POA: Diagnosis not present

## 2021-10-08 DIAGNOSIS — I5022 Chronic systolic (congestive) heart failure: Secondary | ICD-10-CM | POA: Diagnosis not present

## 2021-10-08 DIAGNOSIS — E1122 Type 2 diabetes mellitus with diabetic chronic kidney disease: Secondary | ICD-10-CM | POA: Diagnosis not present

## 2021-10-08 NOTE — Chronic Care Management (AMB) (Signed)
  Care Coordination  Outreach Note  10/08/2021 Name: ANGELIZ SETTLEMYRE MRN: [id number] DOB: 28-Feb-1934   Care Coordination Outreach Attempts: An unsuccessful telephone outreach was attempted today to offer the patient information about available care coordination services as a benefit of their health plan.   Follow Up Plan:  Additional outreach attempts will be made to offer the patient care coordination information and services.   Encounter Outcome:  No Answer   [place]  Direct Dial: [phone]

## 2021-10-09 DIAGNOSIS — R040 Epistaxis: Secondary | ICD-10-CM | POA: Diagnosis not present

## 2021-10-09 DIAGNOSIS — I5022 Chronic systolic (congestive) heart failure: Secondary | ICD-10-CM | POA: Diagnosis not present

## 2021-10-09 DIAGNOSIS — Z09 Encounter for follow-up examination after completed treatment for conditions other than malignant neoplasm: Secondary | ICD-10-CM | POA: Diagnosis not present

## 2021-10-09 DIAGNOSIS — I10 Essential (primary) hypertension: Secondary | ICD-10-CM | POA: Diagnosis not present

## 2021-10-09 DIAGNOSIS — R42 Dizziness and giddiness: Secondary | ICD-10-CM | POA: Diagnosis not present

## 2021-10-09 DIAGNOSIS — Z299 Encounter for prophylactic measures, unspecified: Secondary | ICD-10-CM | POA: Diagnosis not present

## 2021-10-10 ENCOUNTER — Encounter (HOSPITAL_COMMUNITY): Payer: Medicare Other

## 2021-10-10 DIAGNOSIS — N184 Chronic kidney disease, stage 4 (severe): Secondary | ICD-10-CM | POA: Diagnosis not present

## 2021-10-10 DIAGNOSIS — M10072 Idiopathic gout, left ankle and foot: Secondary | ICD-10-CM | POA: Diagnosis not present

## 2021-10-10 DIAGNOSIS — E1122 Type 2 diabetes mellitus with diabetic chronic kidney disease: Secondary | ICD-10-CM | POA: Diagnosis not present

## 2021-10-10 DIAGNOSIS — I480 Paroxysmal atrial fibrillation: Secondary | ICD-10-CM | POA: Diagnosis not present

## 2021-10-10 DIAGNOSIS — I5022 Chronic systolic (congestive) heart failure: Secondary | ICD-10-CM | POA: Diagnosis not present

## 2021-10-10 DIAGNOSIS — I130 Hypertensive heart and chronic kidney disease with heart failure and stage 1 through stage 4 chronic kidney disease, or unspecified chronic kidney disease: Secondary | ICD-10-CM | POA: Diagnosis not present

## 2021-10-10 NOTE — Chronic Care Management (AMB) (Unsigned)
  Care Coordination  Outreach Note  10/10/2021 Name: Crystal [name] MRN: [id number] DOB: February 16, 1934   Care Coordination Outreach Attempts: A second unsuccessful outreach was attempted today to offer the patient with information about available care coordination services as a benefit of their health plan.     Follow Up Plan:  Additional outreach attempts will be made to offer the patient care coordination information and services.   Encounter Outcome:  No Answer  [place]  Direct Dial: [phone]

## 2021-10-11 ENCOUNTER — Other Ambulatory Visit: Payer: Self-pay | Admitting: Cardiology

## 2021-10-11 DIAGNOSIS — M10072 Idiopathic gout, left ankle and foot: Secondary | ICD-10-CM | POA: Diagnosis not present

## 2021-10-11 DIAGNOSIS — I5022 Chronic systolic (congestive) heart failure: Secondary | ICD-10-CM | POA: Diagnosis not present

## 2021-10-11 DIAGNOSIS — E1122 Type 2 diabetes mellitus with diabetic chronic kidney disease: Secondary | ICD-10-CM | POA: Diagnosis not present

## 2021-10-11 DIAGNOSIS — N184 Chronic kidney disease, stage 4 (severe): Secondary | ICD-10-CM | POA: Diagnosis not present

## 2021-10-11 DIAGNOSIS — I130 Hypertensive heart and chronic kidney disease with heart failure and stage 1 through stage 4 chronic kidney disease, or unspecified chronic kidney disease: Secondary | ICD-10-CM | POA: Diagnosis not present

## 2021-10-11 DIAGNOSIS — I480 Paroxysmal atrial fibrillation: Secondary | ICD-10-CM | POA: Diagnosis not present

## 2021-10-14 DIAGNOSIS — E1122 Type 2 diabetes mellitus with diabetic chronic kidney disease: Secondary | ICD-10-CM | POA: Diagnosis not present

## 2021-10-14 DIAGNOSIS — R7401 Elevation of levels of liver transaminase levels: Secondary | ICD-10-CM | POA: Diagnosis not present

## 2021-10-14 DIAGNOSIS — J189 Pneumonia, unspecified organism: Secondary | ICD-10-CM | POA: Diagnosis not present

## 2021-10-14 DIAGNOSIS — I130 Hypertensive heart and chronic kidney disease with heart failure and stage 1 through stage 4 chronic kidney disease, or unspecified chronic kidney disease: Secondary | ICD-10-CM | POA: Diagnosis not present

## 2021-10-14 DIAGNOSIS — Z7982 Long term (current) use of aspirin: Secondary | ICD-10-CM | POA: Diagnosis not present

## 2021-10-14 DIAGNOSIS — Z79899 Other long term (current) drug therapy: Secondary | ICD-10-CM | POA: Diagnosis not present

## 2021-10-14 DIAGNOSIS — R059 Cough, unspecified: Secondary | ICD-10-CM | POA: Diagnosis not present

## 2021-10-14 DIAGNOSIS — Z882 Allergy status to sulfonamides status: Secondary | ICD-10-CM | POA: Diagnosis not present

## 2021-10-14 DIAGNOSIS — R7989 Other specified abnormal findings of blood chemistry: Secondary | ICD-10-CM | POA: Diagnosis not present

## 2021-10-14 DIAGNOSIS — I4891 Unspecified atrial fibrillation: Secondary | ICD-10-CM | POA: Diagnosis not present

## 2021-10-14 DIAGNOSIS — N189 Chronic kidney disease, unspecified: Secondary | ICD-10-CM | POA: Diagnosis not present

## 2021-10-14 DIAGNOSIS — Z792 Long term (current) use of antibiotics: Secondary | ICD-10-CM | POA: Diagnosis not present

## 2021-10-14 DIAGNOSIS — R54 Age-related physical debility: Secondary | ICD-10-CM | POA: Diagnosis not present

## 2021-10-14 DIAGNOSIS — N184 Chronic kidney disease, stage 4 (severe): Secondary | ICD-10-CM | POA: Diagnosis not present

## 2021-10-14 DIAGNOSIS — R0902 Hypoxemia: Secondary | ICD-10-CM | POA: Diagnosis not present

## 2021-10-14 DIAGNOSIS — D72829 Elevated white blood cell count, unspecified: Secondary | ICD-10-CM | POA: Diagnosis not present

## 2021-10-14 DIAGNOSIS — N179 Acute kidney failure, unspecified: Secondary | ICD-10-CM | POA: Diagnosis not present

## 2021-10-14 DIAGNOSIS — J168 Pneumonia due to other specified infectious organisms: Secondary | ICD-10-CM | POA: Diagnosis not present

## 2021-10-14 DIAGNOSIS — D631 Anemia in chronic kidney disease: Secondary | ICD-10-CM | POA: Diagnosis not present

## 2021-10-14 DIAGNOSIS — Z7901 Long term (current) use of anticoagulants: Secondary | ICD-10-CM | POA: Diagnosis not present

## 2021-10-14 DIAGNOSIS — D649 Anemia, unspecified: Secondary | ICD-10-CM | POA: Diagnosis not present

## 2021-10-14 DIAGNOSIS — I5032 Chronic diastolic (congestive) heart failure: Secondary | ICD-10-CM | POA: Diagnosis not present

## 2021-10-14 DIAGNOSIS — E871 Hypo-osmolality and hyponatremia: Secondary | ICD-10-CM | POA: Diagnosis not present

## 2021-10-14 DIAGNOSIS — Z20822 Contact with and (suspected) exposure to covid-19: Secondary | ICD-10-CM | POA: Diagnosis not present

## 2021-10-14 DIAGNOSIS — I480 Paroxysmal atrial fibrillation: Secondary | ICD-10-CM | POA: Diagnosis not present

## 2021-10-14 DIAGNOSIS — E875 Hyperkalemia: Secondary | ICD-10-CM | POA: Diagnosis not present

## 2021-10-14 DIAGNOSIS — E876 Hypokalemia: Secondary | ICD-10-CM | POA: Diagnosis not present

## 2021-10-14 DIAGNOSIS — Z7984 Long term (current) use of oral hypoglycemic drugs: Secondary | ICD-10-CM | POA: Diagnosis not present

## 2021-10-17 NOTE — Chronic Care Management (AMB) (Signed)
  Care Coordination  Outreach Note  10/17/2021 Name: GABRIAL POPPELL MRN: [id number] DOB: Nov 04, 1934   Care Coordination Outreach Attempts: A third unsuccessful outreach was attempted today to offer the patient with information about available care coordination services as a benefit of their health plan.   Follow Up Plan:  Additional outreach attempts will be made to offer the patient care coordination information and services.   Encounter Outcome:  Pt. Request to Call Back due to hospitalization    [place]  Direct Dial: [phone]

## 2021-10-22 DIAGNOSIS — M10072 Idiopathic gout, left ankle and foot: Secondary | ICD-10-CM | POA: Diagnosis not present

## 2021-10-22 DIAGNOSIS — I5022 Chronic systolic (congestive) heart failure: Secondary | ICD-10-CM | POA: Diagnosis not present

## 2021-10-22 DIAGNOSIS — N184 Chronic kidney disease, stage 4 (severe): Secondary | ICD-10-CM | POA: Diagnosis not present

## 2021-10-22 DIAGNOSIS — I130 Hypertensive heart and chronic kidney disease with heart failure and stage 1 through stage 4 chronic kidney disease, or unspecified chronic kidney disease: Secondary | ICD-10-CM | POA: Diagnosis not present

## 2021-10-22 DIAGNOSIS — E1122 Type 2 diabetes mellitus with diabetic chronic kidney disease: Secondary | ICD-10-CM | POA: Diagnosis not present

## 2021-10-22 DIAGNOSIS — I480 Paroxysmal atrial fibrillation: Secondary | ICD-10-CM | POA: Diagnosis not present

## 2021-10-23 ENCOUNTER — Telehealth: Payer: Self-pay | Admitting: *Deleted

## 2021-10-23 DIAGNOSIS — N184 Chronic kidney disease, stage 4 (severe): Secondary | ICD-10-CM | POA: Diagnosis not present

## 2021-10-23 DIAGNOSIS — I130 Hypertensive heart and chronic kidney disease with heart failure and stage 1 through stage 4 chronic kidney disease, or unspecified chronic kidney disease: Secondary | ICD-10-CM | POA: Diagnosis not present

## 2021-10-23 DIAGNOSIS — E1122 Type 2 diabetes mellitus with diabetic chronic kidney disease: Secondary | ICD-10-CM | POA: Diagnosis not present

## 2021-10-23 DIAGNOSIS — M10072 Idiopathic gout, left ankle and foot: Secondary | ICD-10-CM | POA: Diagnosis not present

## 2021-10-23 DIAGNOSIS — I5022 Chronic systolic (congestive) heart failure: Secondary | ICD-10-CM | POA: Diagnosis not present

## 2021-10-23 DIAGNOSIS — I480 Paroxysmal atrial fibrillation: Secondary | ICD-10-CM | POA: Diagnosis not present

## 2021-10-23 NOTE — Chronic Care Management (AMB) (Signed)
  Care Coordination   Note   10/23/2021 Name: [name] MRN: [id number] DOB: 04/23/1934  Crystal [name] is a 87 y.o. year old female who sees Vyas, Costella Hatcher, MD for primary care. I reached out to [employer] by phone today to offer care coordination services.  Crystal [name] was given information about Care Coordination services today including:   The Care Coordination services include support from the care team which includes your Nurse Coordinator, Clinical Social Worker, or Pharmacist.  The Care Coordination team is here to help remove barriers to the health concerns and goals most important to you. Care Coordination services are voluntary, and the patient may decline or stop services at any time by request to their care team member.   Care Coordination Consent Status: Patient agreed to services and verbal consent obtained.   Follow up plan:  Telephone appointment with care coordination team member scheduled for:  11/05/21  Encounter Outcome:  Pt. Scheduled  [place]  Direct Dial: [phone]

## 2021-10-24 DIAGNOSIS — I480 Paroxysmal atrial fibrillation: Secondary | ICD-10-CM | POA: Diagnosis not present

## 2021-10-24 DIAGNOSIS — M10072 Idiopathic gout, left ankle and foot: Secondary | ICD-10-CM | POA: Diagnosis not present

## 2021-10-24 DIAGNOSIS — N184 Chronic kidney disease, stage 4 (severe): Secondary | ICD-10-CM | POA: Diagnosis not present

## 2021-10-24 DIAGNOSIS — I5022 Chronic systolic (congestive) heart failure: Secondary | ICD-10-CM | POA: Diagnosis not present

## 2021-10-24 DIAGNOSIS — E1122 Type 2 diabetes mellitus with diabetic chronic kidney disease: Secondary | ICD-10-CM | POA: Diagnosis not present

## 2021-10-24 DIAGNOSIS — I130 Hypertensive heart and chronic kidney disease with heart failure and stage 1 through stage 4 chronic kidney disease, or unspecified chronic kidney disease: Secondary | ICD-10-CM | POA: Diagnosis not present

## 2021-10-25 DIAGNOSIS — Z09 Encounter for follow-up examination after completed treatment for conditions other than malignant neoplasm: Secondary | ICD-10-CM | POA: Diagnosis not present

## 2021-10-25 DIAGNOSIS — Z713 Dietary counseling and surveillance: Secondary | ICD-10-CM | POA: Diagnosis not present

## 2021-10-25 DIAGNOSIS — Z681 Body mass index (BMI) 19 or less, adult: Secondary | ICD-10-CM | POA: Diagnosis not present

## 2021-10-25 DIAGNOSIS — J189 Pneumonia, unspecified organism: Secondary | ICD-10-CM | POA: Diagnosis not present

## 2021-10-25 DIAGNOSIS — I10 Essential (primary) hypertension: Secondary | ICD-10-CM | POA: Diagnosis not present

## 2021-10-28 ENCOUNTER — Other Ambulatory Visit (HOSPITAL_COMMUNITY)
Admission: RE | Admit: 2021-10-28 | Discharge: 2021-10-28 | Disposition: A | Discharge status: Home / Self Care | Payer: Medicare Other | Source: Ambulatory Visit | Attending: Nephrology | Admitting: Nephrology

## 2021-10-28 DIAGNOSIS — I130 Hypertensive heart and chronic kidney disease with heart failure and stage 1 through stage 4 chronic kidney disease, or unspecified chronic kidney disease: Secondary | ICD-10-CM | POA: Diagnosis not present

## 2021-10-28 DIAGNOSIS — I5022 Chronic systolic (congestive) heart failure: Secondary | ICD-10-CM | POA: Diagnosis not present

## 2021-10-28 DIAGNOSIS — N184 Chronic kidney disease, stage 4 (severe): Secondary | ICD-10-CM | POA: Diagnosis not present

## 2021-10-28 DIAGNOSIS — I480 Paroxysmal atrial fibrillation: Secondary | ICD-10-CM | POA: Diagnosis not present

## 2021-10-28 DIAGNOSIS — M10072 Idiopathic gout, left ankle and foot: Secondary | ICD-10-CM | POA: Diagnosis not present

## 2021-10-28 DIAGNOSIS — N189 Chronic kidney disease, unspecified: Secondary | ICD-10-CM | POA: Insufficient documentation

## 2021-10-28 DIAGNOSIS — E1122 Type 2 diabetes mellitus with diabetic chronic kidney disease: Secondary | ICD-10-CM | POA: Diagnosis not present

## 2021-10-28 LAB — CBC
HCT: 26.3 % — ABNORMAL LOW (ref 36.0–46.0)
Hemoglobin: 8.6 g/dL — ABNORMAL LOW (ref 12.0–15.0)
MCH: 27.4 pg (ref 26.0–34.0)
MCHC: 32.7 g/dL (ref 30.0–36.0)
MCV: 83.8 fL (ref 80.0–100.0)
Platelets: 426 10*3/uL — ABNORMAL HIGH (ref 150–400)
RBC: 3.14 MIL/uL — ABNORMAL LOW (ref 3.87–5.11)
RDW: 17.2 % — ABNORMAL HIGH (ref 11.5–15.5)
WBC: 7.7 10*3/uL (ref 4.0–10.5)
nRBC: 0 % (ref 0.0–0.2)

## 2021-10-28 LAB — IRON AND TIBC
Iron: 74 ug/dL (ref 28–170)
Saturation Ratios: 25 % (ref 10.4–31.8)
TIBC: 292 ug/dL (ref 250–450)
UIBC: 218 ug/dL

## 2021-10-28 LAB — PROTEIN / CREATININE RATIO, URINE
Creatinine, Urine: 68.33 mg/dL
Protein Creatinine Ratio: 0.15 mg/mg{Cre} (ref 0.00–0.15)
Total Protein, Urine: 10 mg/dL

## 2021-10-28 LAB — RENAL FUNCTION PANEL
Albumin: 3 g/dL — ABNORMAL LOW (ref 3.5–5.0)
Anion gap: 11 (ref 5–15)
BUN: 34 mg/dL — ABNORMAL HIGH (ref 8–23)
CO2: 24 mmol/L (ref 22–32)
Calcium: 8.6 mg/dL — ABNORMAL LOW (ref 8.9–10.3)
Chloride: 102 mmol/L (ref 98–111)
Creatinine, Ser: 2.03 mg/dL — ABNORMAL HIGH (ref 0.44–1.00)
GFR, Estimated: 24 mL/min — ABNORMAL LOW (ref >60)
Glucose, Bld: 137 mg/dL — ABNORMAL HIGH (ref 70–99)
Phosphorus: 3.7 mg/dL (ref 2.5–4.6)
Potassium: 3.1 mmol/L — ABNORMAL LOW (ref 3.5–5.1)
Sodium: 137 mmol/L (ref 135–145)

## 2021-10-28 LAB — FERRITIN: Ferritin: 187 ng/mL (ref 11–307)

## 2021-10-29 DIAGNOSIS — I480 Paroxysmal atrial fibrillation: Secondary | ICD-10-CM | POA: Diagnosis not present

## 2021-10-29 DIAGNOSIS — M10072 Idiopathic gout, left ankle and foot: Secondary | ICD-10-CM | POA: Diagnosis not present

## 2021-10-29 DIAGNOSIS — N184 Chronic kidney disease, stage 4 (severe): Secondary | ICD-10-CM | POA: Diagnosis not present

## 2021-10-29 DIAGNOSIS — I130 Hypertensive heart and chronic kidney disease with heart failure and stage 1 through stage 4 chronic kidney disease, or unspecified chronic kidney disease: Secondary | ICD-10-CM | POA: Diagnosis not present

## 2021-10-29 DIAGNOSIS — E1122 Type 2 diabetes mellitus with diabetic chronic kidney disease: Secondary | ICD-10-CM | POA: Diagnosis not present

## 2021-10-29 DIAGNOSIS — I5022 Chronic systolic (congestive) heart failure: Secondary | ICD-10-CM | POA: Diagnosis not present

## 2021-10-30 DIAGNOSIS — E876 Hypokalemia: Secondary | ICD-10-CM | POA: Diagnosis not present

## 2021-10-30 DIAGNOSIS — M10072 Idiopathic gout, left ankle and foot: Secondary | ICD-10-CM | POA: Diagnosis not present

## 2021-10-30 DIAGNOSIS — I480 Paroxysmal atrial fibrillation: Secondary | ICD-10-CM | POA: Diagnosis not present

## 2021-10-30 DIAGNOSIS — E1122 Type 2 diabetes mellitus with diabetic chronic kidney disease: Secondary | ICD-10-CM | POA: Diagnosis not present

## 2021-10-30 DIAGNOSIS — N189 Chronic kidney disease, unspecified: Secondary | ICD-10-CM | POA: Diagnosis not present

## 2021-10-30 DIAGNOSIS — E1129 Type 2 diabetes mellitus with other diabetic kidney complication: Secondary | ICD-10-CM | POA: Diagnosis not present

## 2021-10-30 DIAGNOSIS — N170 Acute kidney failure with tubular necrosis: Secondary | ICD-10-CM | POA: Diagnosis not present

## 2021-10-30 DIAGNOSIS — I5022 Chronic systolic (congestive) heart failure: Secondary | ICD-10-CM | POA: Diagnosis not present

## 2021-10-30 DIAGNOSIS — E871 Hypo-osmolality and hyponatremia: Secondary | ICD-10-CM | POA: Diagnosis not present

## 2021-10-30 DIAGNOSIS — D638 Anemia in other chronic diseases classified elsewhere: Secondary | ICD-10-CM | POA: Diagnosis not present

## 2021-10-30 DIAGNOSIS — R809 Proteinuria, unspecified: Secondary | ICD-10-CM | POA: Diagnosis not present

## 2021-10-30 DIAGNOSIS — N184 Chronic kidney disease, stage 4 (severe): Secondary | ICD-10-CM | POA: Diagnosis not present

## 2021-10-30 DIAGNOSIS — I130 Hypertensive heart and chronic kidney disease with heart failure and stage 1 through stage 4 chronic kidney disease, or unspecified chronic kidney disease: Secondary | ICD-10-CM | POA: Diagnosis not present

## 2021-11-04 DIAGNOSIS — I130 Hypertensive heart and chronic kidney disease with heart failure and stage 1 through stage 4 chronic kidney disease, or unspecified chronic kidney disease: Secondary | ICD-10-CM | POA: Diagnosis not present

## 2021-11-04 DIAGNOSIS — N184 Chronic kidney disease, stage 4 (severe): Secondary | ICD-10-CM | POA: Diagnosis not present

## 2021-11-04 DIAGNOSIS — D509 Iron deficiency anemia, unspecified: Secondary | ICD-10-CM | POA: Diagnosis not present

## 2021-11-04 DIAGNOSIS — D631 Anemia in chronic kidney disease: Secondary | ICD-10-CM | POA: Diagnosis not present

## 2021-11-04 DIAGNOSIS — Z85828 Personal history of other malignant neoplasm of skin: Secondary | ICD-10-CM | POA: Diagnosis not present

## 2021-11-04 DIAGNOSIS — E1122 Type 2 diabetes mellitus with diabetic chronic kidney disease: Secondary | ICD-10-CM | POA: Diagnosis not present

## 2021-11-04 DIAGNOSIS — I252 Old myocardial infarction: Secondary | ICD-10-CM | POA: Diagnosis not present

## 2021-11-04 DIAGNOSIS — M10072 Idiopathic gout, left ankle and foot: Secondary | ICD-10-CM | POA: Diagnosis not present

## 2021-11-04 DIAGNOSIS — Z7722 Contact with and (suspected) exposure to environmental tobacco smoke (acute) (chronic): Secondary | ICD-10-CM | POA: Diagnosis not present

## 2021-11-04 DIAGNOSIS — M8580 Other specified disorders of bone density and structure, unspecified site: Secondary | ICD-10-CM | POA: Diagnosis not present

## 2021-11-04 DIAGNOSIS — Z9049 Acquired absence of other specified parts of digestive tract: Secondary | ICD-10-CM | POA: Diagnosis not present

## 2021-11-04 DIAGNOSIS — I2510 Atherosclerotic heart disease of native coronary artery without angina pectoris: Secondary | ICD-10-CM | POA: Diagnosis not present

## 2021-11-04 DIAGNOSIS — Z955 Presence of coronary angioplasty implant and graft: Secondary | ICD-10-CM | POA: Diagnosis not present

## 2021-11-04 DIAGNOSIS — I480 Paroxysmal atrial fibrillation: Secondary | ICD-10-CM | POA: Diagnosis not present

## 2021-11-04 DIAGNOSIS — J189 Pneumonia, unspecified organism: Secondary | ICD-10-CM | POA: Diagnosis not present

## 2021-11-04 DIAGNOSIS — Z9011 Acquired absence of right breast and nipple: Secondary | ICD-10-CM | POA: Diagnosis not present

## 2021-11-04 DIAGNOSIS — E875 Hyperkalemia: Secondary | ICD-10-CM | POA: Diagnosis not present

## 2021-11-04 DIAGNOSIS — E871 Hypo-osmolality and hyponatremia: Secondary | ICD-10-CM | POA: Diagnosis not present

## 2021-11-04 DIAGNOSIS — E782 Mixed hyperlipidemia: Secondary | ICD-10-CM | POA: Diagnosis not present

## 2021-11-04 DIAGNOSIS — I5042 Chronic combined systolic (congestive) and diastolic (congestive) heart failure: Secondary | ICD-10-CM | POA: Diagnosis not present

## 2021-11-04 DIAGNOSIS — Z853 Personal history of malignant neoplasm of breast: Secondary | ICD-10-CM | POA: Diagnosis not present

## 2021-11-04 DIAGNOSIS — E041 Nontoxic single thyroid nodule: Secondary | ICD-10-CM | POA: Diagnosis not present

## 2021-11-04 DIAGNOSIS — K219 Gastro-esophageal reflux disease without esophagitis: Secondary | ICD-10-CM | POA: Diagnosis not present

## 2021-11-04 DIAGNOSIS — E878 Other disorders of electrolyte and fluid balance, not elsewhere classified: Secondary | ICD-10-CM | POA: Diagnosis not present

## 2021-11-04 DIAGNOSIS — G510 Bell's palsy: Secondary | ICD-10-CM | POA: Diagnosis not present

## 2021-11-05 ENCOUNTER — Ambulatory Visit: Payer: Self-pay | Admitting: *Deleted

## 2021-11-05 DIAGNOSIS — I5042 Chronic combined systolic (congestive) and diastolic (congestive) heart failure: Secondary | ICD-10-CM | POA: Diagnosis not present

## 2021-11-05 DIAGNOSIS — E1122 Type 2 diabetes mellitus with diabetic chronic kidney disease: Secondary | ICD-10-CM | POA: Diagnosis not present

## 2021-11-05 DIAGNOSIS — J189 Pneumonia, unspecified organism: Secondary | ICD-10-CM | POA: Diagnosis not present

## 2021-11-05 DIAGNOSIS — N184 Chronic kidney disease, stage 4 (severe): Secondary | ICD-10-CM | POA: Diagnosis not present

## 2021-11-05 DIAGNOSIS — I130 Hypertensive heart and chronic kidney disease with heart failure and stage 1 through stage 4 chronic kidney disease, or unspecified chronic kidney disease: Secondary | ICD-10-CM | POA: Diagnosis not present

## 2021-11-05 DIAGNOSIS — D631 Anemia in chronic kidney disease: Secondary | ICD-10-CM | POA: Diagnosis not present

## 2021-11-05 NOTE — Patient Outreach (Signed)
  Care Coordination   Initial Visit Note   11/05/2021 Name: Crystal [name] MRN: [id number] DOB: 02-15-1934  Crystal [name] is a 87 y.o. year old female who sees Vyas, Costella Hatcher, MD for primary care. I spoke with  Crystal [name] by phone today.  What matters to the patients health and wellness today?  State this is not a good time to talk, request call back tomorrow.    SDOH assessments and interventions completed:  No     Care Coordination Interventions Activated:  No  Care Coordination Interventions:  No, not indicated   Follow up plan: Follow up call scheduled for 10/11    Encounter Outcome:  Pt. Request to Call [place], RN, MSN, [place] [facility] Care Management Coordinator [phone]

## 2021-11-06 ENCOUNTER — Ambulatory Visit: Payer: Self-pay | Admitting: *Deleted

## 2021-11-06 DIAGNOSIS — N184 Chronic kidney disease, stage 4 (severe): Secondary | ICD-10-CM | POA: Diagnosis not present

## 2021-11-06 DIAGNOSIS — I5042 Chronic combined systolic (congestive) and diastolic (congestive) heart failure: Secondary | ICD-10-CM | POA: Diagnosis not present

## 2021-11-06 DIAGNOSIS — E1122 Type 2 diabetes mellitus with diabetic chronic kidney disease: Secondary | ICD-10-CM | POA: Diagnosis not present

## 2021-11-06 DIAGNOSIS — J189 Pneumonia, unspecified organism: Secondary | ICD-10-CM | POA: Diagnosis not present

## 2021-11-06 DIAGNOSIS — D631 Anemia in chronic kidney disease: Secondary | ICD-10-CM | POA: Diagnosis not present

## 2021-11-06 DIAGNOSIS — I130 Hypertensive heart and chronic kidney disease with heart failure and stage 1 through stage 4 chronic kidney disease, or unspecified chronic kidney disease: Secondary | ICD-10-CM | POA: Diagnosis not present

## 2021-11-06 NOTE — Patient Outreach (Signed)
  Care Coordination   Initial Visit Note   11/08/2021 Name: Crystal [name] MRN: [id number] DOB: 12/29/1934  Crystal [name] is a 87 y.o. year old female who sees Vyas, Costella Hatcher, MD for primary care. I spoke with  Crystal [name] by phone today.  What matters to the patients health and wellness today?  Report she was recently in the hospital for pneumonia, discharged to her niece's home initially.  She is now back in her own home.  Does not want monthly calls, but agrees to quarterly calls.  Denies any urgent concerns, encouraged to contact this care manager with questions.      Goals Addressed             This Visit's Progress    Recovery of recent admission for PNE       Care Coordination Interventions: Evaluation of current treatment plan related to pneumonia and patient's adherence to plan as established by provider Advised patient to continue working with home health PT/OT Reviewed medications with patient and discussed affordability Reviewed scheduled/upcoming provider appointments including PCP follow up on 11/1 Discussed plans with patient for ongoing care management follow up and provided patient with direct contact information for care management team Assessed social determinant of health barriers         SDOH assessments and interventions completed:  Yes  SDOH Interventions Today    Flowsheet Row Most Recent Value  SDOH Interventions   Food Insecurity Interventions Intervention Not Indicated  Housing Interventions Intervention Not Indicated  Transportation Interventions Intervention Not Indicated  Utilities Interventions Intervention Not Indicated        Care Coordination Interventions Activated:  Yes  Care Coordination Interventions:  Yes, provided   Follow up plan: Follow up call scheduled for 11/2    Encounter Outcome:  Pt. Visit Completed   Valente David, RN, MSN, [place] [facility] Care Management Coordinator [phone]

## 2021-11-07 ENCOUNTER — Ambulatory Visit (HOSPITAL_COMMUNITY)
Admission: RE | Admit: 2021-11-07 | Discharge: 2021-11-07 | Disposition: A | Discharge status: Home / Self Care | Payer: Medicare Other | Source: Ambulatory Visit | Attending: Nephrology | Admitting: Nephrology

## 2021-11-07 VITALS — BP 146/42 | HR 55 | Temp 97.8°F | Resp 18 | Ht 66.0 in | Wt 115.0 lb

## 2021-11-07 DIAGNOSIS — D631 Anemia in chronic kidney disease: Secondary | ICD-10-CM | POA: Diagnosis not present

## 2021-11-07 DIAGNOSIS — N1832 Chronic kidney disease, stage 3b: Secondary | ICD-10-CM

## 2021-11-07 DIAGNOSIS — N184 Chronic kidney disease, stage 4 (severe): Secondary | ICD-10-CM | POA: Diagnosis not present

## 2021-11-07 DIAGNOSIS — I5043 Acute on chronic combined systolic (congestive) and diastolic (congestive) heart failure: Secondary | ICD-10-CM

## 2021-11-07 LAB — POCT HEMOGLOBIN-HEMACUE: Hemoglobin: 10.1 g/dL — ABNORMAL LOW (ref 12.0–15.0)

## 2021-11-07 MED ORDER — EPOETIN ALFA-EPBX 3000 UNIT/ML IJ SOLN: 3000.0000 [IU] | Freq: Once | INTRAMUSCULAR | Status: DC

## 2021-11-08 ENCOUNTER — Encounter: Payer: Self-pay | Admitting: *Deleted

## 2021-11-08 DIAGNOSIS — Z1231 Encounter for screening mammogram for malignant neoplasm of breast: Secondary | ICD-10-CM | POA: Diagnosis not present

## 2021-11-12 DIAGNOSIS — E1122 Type 2 diabetes mellitus with diabetic chronic kidney disease: Secondary | ICD-10-CM | POA: Diagnosis not present

## 2021-11-12 DIAGNOSIS — I130 Hypertensive heart and chronic kidney disease with heart failure and stage 1 through stage 4 chronic kidney disease, or unspecified chronic kidney disease: Secondary | ICD-10-CM | POA: Diagnosis not present

## 2021-11-12 DIAGNOSIS — N184 Chronic kidney disease, stage 4 (severe): Secondary | ICD-10-CM | POA: Diagnosis not present

## 2021-11-12 DIAGNOSIS — D631 Anemia in chronic kidney disease: Secondary | ICD-10-CM | POA: Diagnosis not present

## 2021-11-12 DIAGNOSIS — J189 Pneumonia, unspecified organism: Secondary | ICD-10-CM | POA: Diagnosis not present

## 2021-11-12 DIAGNOSIS — I5042 Chronic combined systolic (congestive) and diastolic (congestive) heart failure: Secondary | ICD-10-CM | POA: Diagnosis not present

## 2021-11-13 DIAGNOSIS — N184 Chronic kidney disease, stage 4 (severe): Secondary | ICD-10-CM | POA: Diagnosis not present

## 2021-11-13 DIAGNOSIS — D631 Anemia in chronic kidney disease: Secondary | ICD-10-CM | POA: Diagnosis not present

## 2021-11-13 DIAGNOSIS — I5042 Chronic combined systolic (congestive) and diastolic (congestive) heart failure: Secondary | ICD-10-CM | POA: Diagnosis not present

## 2021-11-13 DIAGNOSIS — E1122 Type 2 diabetes mellitus with diabetic chronic kidney disease: Secondary | ICD-10-CM | POA: Diagnosis not present

## 2021-11-13 DIAGNOSIS — I130 Hypertensive heart and chronic kidney disease with heart failure and stage 1 through stage 4 chronic kidney disease, or unspecified chronic kidney disease: Secondary | ICD-10-CM | POA: Diagnosis not present

## 2021-11-13 DIAGNOSIS — J189 Pneumonia, unspecified organism: Secondary | ICD-10-CM | POA: Diagnosis not present

## 2021-11-19 DIAGNOSIS — I5042 Chronic combined systolic (congestive) and diastolic (congestive) heart failure: Secondary | ICD-10-CM | POA: Diagnosis not present

## 2021-11-19 DIAGNOSIS — I130 Hypertensive heart and chronic kidney disease with heart failure and stage 1 through stage 4 chronic kidney disease, or unspecified chronic kidney disease: Secondary | ICD-10-CM | POA: Diagnosis not present

## 2021-11-19 DIAGNOSIS — D631 Anemia in chronic kidney disease: Secondary | ICD-10-CM | POA: Diagnosis not present

## 2021-11-19 DIAGNOSIS — E1122 Type 2 diabetes mellitus with diabetic chronic kidney disease: Secondary | ICD-10-CM | POA: Diagnosis not present

## 2021-11-19 DIAGNOSIS — J189 Pneumonia, unspecified organism: Secondary | ICD-10-CM | POA: Diagnosis not present

## 2021-11-19 DIAGNOSIS — N184 Chronic kidney disease, stage 4 (severe): Secondary | ICD-10-CM | POA: Diagnosis not present

## 2021-11-20 DIAGNOSIS — N184 Chronic kidney disease, stage 4 (severe): Secondary | ICD-10-CM | POA: Diagnosis not present

## 2021-11-20 DIAGNOSIS — E1122 Type 2 diabetes mellitus with diabetic chronic kidney disease: Secondary | ICD-10-CM | POA: Diagnosis not present

## 2021-11-20 DIAGNOSIS — I5042 Chronic combined systolic (congestive) and diastolic (congestive) heart failure: Secondary | ICD-10-CM | POA: Diagnosis not present

## 2021-11-20 DIAGNOSIS — J189 Pneumonia, unspecified organism: Secondary | ICD-10-CM | POA: Diagnosis not present

## 2021-11-20 DIAGNOSIS — I130 Hypertensive heart and chronic kidney disease with heart failure and stage 1 through stage 4 chronic kidney disease, or unspecified chronic kidney disease: Secondary | ICD-10-CM | POA: Diagnosis not present

## 2021-11-20 DIAGNOSIS — D631 Anemia in chronic kidney disease: Secondary | ICD-10-CM | POA: Diagnosis not present

## 2021-11-26 DIAGNOSIS — I5042 Chronic combined systolic (congestive) and diastolic (congestive) heart failure: Secondary | ICD-10-CM | POA: Diagnosis not present

## 2021-11-26 DIAGNOSIS — D631 Anemia in chronic kidney disease: Secondary | ICD-10-CM | POA: Diagnosis not present

## 2021-11-26 DIAGNOSIS — J189 Pneumonia, unspecified organism: Secondary | ICD-10-CM | POA: Diagnosis not present

## 2021-11-26 DIAGNOSIS — E1122 Type 2 diabetes mellitus with diabetic chronic kidney disease: Secondary | ICD-10-CM | POA: Diagnosis not present

## 2021-11-26 DIAGNOSIS — N184 Chronic kidney disease, stage 4 (severe): Secondary | ICD-10-CM | POA: Diagnosis not present

## 2021-11-26 DIAGNOSIS — I130 Hypertensive heart and chronic kidney disease with heart failure and stage 1 through stage 4 chronic kidney disease, or unspecified chronic kidney disease: Secondary | ICD-10-CM | POA: Diagnosis not present

## 2021-11-27 DIAGNOSIS — E7800 Pure hypercholesterolemia, unspecified: Secondary | ICD-10-CM | POA: Diagnosis not present

## 2021-11-27 DIAGNOSIS — Z1339 Encounter for screening examination for other mental health and behavioral disorders: Secondary | ICD-10-CM | POA: Diagnosis not present

## 2021-11-27 DIAGNOSIS — Z299 Encounter for prophylactic measures, unspecified: Secondary | ICD-10-CM | POA: Diagnosis not present

## 2021-11-27 DIAGNOSIS — Z7189 Other specified counseling: Secondary | ICD-10-CM | POA: Diagnosis not present

## 2021-11-27 DIAGNOSIS — Z789 Other specified health status: Secondary | ICD-10-CM | POA: Diagnosis not present

## 2021-11-27 DIAGNOSIS — Z0000 Encounter for general adult medical examination without abnormal findings: Secondary | ICD-10-CM | POA: Diagnosis not present

## 2021-11-27 DIAGNOSIS — Z681 Body mass index (BMI) 19 or less, adult: Secondary | ICD-10-CM | POA: Diagnosis not present

## 2021-11-27 DIAGNOSIS — Z79899 Other long term (current) drug therapy: Secondary | ICD-10-CM | POA: Diagnosis not present

## 2021-11-27 DIAGNOSIS — I10 Essential (primary) hypertension: Secondary | ICD-10-CM | POA: Diagnosis not present

## 2021-11-27 DIAGNOSIS — R5383 Other fatigue: Secondary | ICD-10-CM | POA: Diagnosis not present

## 2021-11-27 DIAGNOSIS — Z1331 Encounter for screening for depression: Secondary | ICD-10-CM | POA: Diagnosis not present

## 2021-12-02 ENCOUNTER — Other Ambulatory Visit: Payer: Self-pay

## 2021-12-02 DIAGNOSIS — D631 Anemia in chronic kidney disease: Secondary | ICD-10-CM | POA: Insufficient documentation

## 2021-12-04 ENCOUNTER — Encounter (HOSPITAL_COMMUNITY)
Admission: RE | Admit: 2021-12-04 | Discharge: 2021-12-04 | Disposition: A | Discharge status: Home / Self Care | Payer: Medicare Other | Source: Ambulatory Visit | Attending: Nephrology | Admitting: Nephrology

## 2021-12-04 DIAGNOSIS — D631 Anemia in chronic kidney disease: Secondary | ICD-10-CM | POA: Insufficient documentation

## 2021-12-04 DIAGNOSIS — N184 Chronic kidney disease, stage 4 (severe): Secondary | ICD-10-CM | POA: Diagnosis not present

## 2021-12-04 MED ORDER — EPOETIN ALFA-EPBX 3000 UNIT/ML IJ SOLN: 3000.0000 [IU] | Freq: Once | INTRAMUSCULAR | Status: DC

## 2021-12-04 NOTE — Progress Notes (Signed)
Diagnosis: Anemia in Chronic Kidney Disease  Provider:  Ulice Bold MD  Procedure:  Retacrit injection not needed.  Hgb 10.8.  Post Care:  None required  Discharge: Condition: Good, Destination: Home . AVS provided to patient.   Performed by:  Binnie Kand, RN

## 2021-12-05 ENCOUNTER — Encounter (HOSPITAL_COMMUNITY): Admission: RE | Admit: 2021-12-05 | Payer: Medicare Other | Source: Ambulatory Visit

## 2021-12-05 LAB — POCT HEMOGLOBIN-HEMACUE: Hemoglobin: 10.8 g/dL — ABNORMAL LOW (ref 12.0–15.0)

## 2021-12-13 ENCOUNTER — Other Ambulatory Visit: Payer: Self-pay | Admitting: Cardiology

## 2021-12-25 ENCOUNTER — Telehealth: Payer: Self-pay | Admitting: Cardiology

## 2021-12-25 MED ORDER — HYDRALAZINE HCL 100 MG PO TABS: 100.0000 mg | ORAL_TABLET | Freq: Three times a day (TID) | ORAL | 1 refills | Status: DC

## 2021-12-25 NOTE — Telephone Encounter (Signed)
Rx sent to [facility]

## 2021-12-25 NOTE — Telephone Encounter (Signed)
 *  STAT* If patient is at the pharmacy, call can be transferred to refill team.   1. Which medications need to be refilled? (please list name of each medication and dose if known)   hydrALAZINE (APRESOLINE) 100 MG tablet     2. Which pharmacy/location (including street and city if local pharmacy) is medication to be sent to?  [facility], [place]    3. Do they need a 30 day or 90 day supply? 30 days   Pt is out of medications, needs refill today

## 2022-01-01 ENCOUNTER — Other Ambulatory Visit (HOSPITAL_COMMUNITY)
Admission: RE | Admit: 2022-01-01 | Discharge: 2022-01-01 | Disposition: A | Discharge status: Home / Self Care | Payer: Medicare Other | Source: Ambulatory Visit | Attending: Nephrology | Admitting: Nephrology

## 2022-01-01 ENCOUNTER — Encounter (HOSPITAL_COMMUNITY)
Admission: RE | Admit: 2022-01-01 | Discharge: 2022-01-01 | Disposition: A | Discharge status: Home / Self Care | Payer: Medicare Other | Source: Ambulatory Visit | Attending: Nephrology | Admitting: Nephrology

## 2022-01-01 VITALS — BP 171/48 | HR 54 | Temp 97.8°F | Resp 16

## 2022-01-01 DIAGNOSIS — N170 Acute kidney failure with tubular necrosis: Secondary | ICD-10-CM | POA: Insufficient documentation

## 2022-01-01 DIAGNOSIS — D631 Anemia in chronic kidney disease: Secondary | ICD-10-CM | POA: Insufficient documentation

## 2022-01-01 DIAGNOSIS — N189 Chronic kidney disease, unspecified: Secondary | ICD-10-CM | POA: Insufficient documentation

## 2022-01-01 LAB — IRON AND TIBC
Iron: 59 ug/dL (ref 28–170)
Saturation Ratios: 16 % (ref 10.4–31.8)
TIBC: 380 ug/dL (ref 250–450)
UIBC: 321 ug/dL

## 2022-01-01 LAB — CBC WITH DIFFERENTIAL/PLATELET
Abs Immature Granulocytes: 0.06 10*3/uL (ref 0.00–0.07)
Basophils Absolute: 0 10*3/uL (ref 0.0–0.1)
Basophils Relative: 1 %
Eosinophils Absolute: 0.2 10*3/uL (ref 0.0–0.5)
Eosinophils Relative: 3 %
HCT: 31.1 % — ABNORMAL LOW (ref 36.0–46.0)
Hemoglobin: 10.2 g/dL — ABNORMAL LOW (ref 12.0–15.0)
Immature Granulocytes: 1 %
Lymphocytes Relative: 20 %
Lymphs Abs: 1.2 10*3/uL (ref 0.7–4.0)
MCH: 28.6 pg (ref 26.0–34.0)
MCHC: 32.8 g/dL (ref 30.0–36.0)
MCV: 87.1 fL (ref 80.0–100.0)
Monocytes Absolute: 1 10*3/uL (ref 0.1–1.0)
Monocytes Relative: 16 %
Neutro Abs: 3.7 10*3/uL (ref 1.7–7.7)
Neutrophils Relative %: 59 %
Platelets: 277 10*3/uL (ref 150–400)
RBC: 3.57 MIL/uL — ABNORMAL LOW (ref 3.87–5.11)
RDW: 15.4 % (ref 11.5–15.5)
WBC: 6.2 10*3/uL (ref 4.0–10.5)
nRBC: 0 % (ref 0.0–0.2)

## 2022-01-01 LAB — FERRITIN: Ferritin: 90 ng/mL (ref 11–307)

## 2022-01-01 LAB — RENAL FUNCTION PANEL
Albumin: 3.9 g/dL (ref 3.5–5.0)
Anion gap: 11 (ref 5–15)
BUN: 66 mg/dL — ABNORMAL HIGH (ref 8–23)
CO2: 23 mmol/L (ref 22–32)
Calcium: 9.2 mg/dL (ref 8.9–10.3)
Chloride: 100 mmol/L (ref 98–111)
Creatinine, Ser: 2.82 mg/dL — ABNORMAL HIGH (ref 0.44–1.00)
GFR, Estimated: 16 mL/min — ABNORMAL LOW (ref >60)
Glucose, Bld: 119 mg/dL — ABNORMAL HIGH (ref 70–99)
Phosphorus: 4.1 mg/dL (ref 2.5–4.6)
Potassium: 4.5 mmol/L (ref 3.5–5.1)
Sodium: 134 mmol/L — ABNORMAL LOW (ref 135–145)

## 2022-01-01 LAB — POCT HEMOGLOBIN-HEMACUE: Hemoglobin: 10.4 g/dL — ABNORMAL LOW (ref 12.0–15.0)

## 2022-01-01 MED ORDER — EPOETIN ALFA-EPBX 3000 UNIT/ML IJ SOLN
3000.0000 [IU] | Freq: Once | INTRAMUSCULAR | Status: DC
  Filled 2022-01-01: qty 1

## 2022-01-01 NOTE — Progress Notes (Signed)
Diagnosis: Anemia in Chronic Kidney Disease  Provider:  Manpreet Bhutani MD  Procedure: Injection  Retacrit (epoetin alfa-epbx), Dose: N/A , Number of injections: not given  Hgb 10.4  Post Care: Observation period completed  Discharge: Condition: Good, Destination: Home . AVS provided to patient.   Performed by:  Oren Beckmann, RN

## 2022-01-03 LAB — PTH, INTACT AND CALCIUM
Calcium, Total (PTH): 9.5 mg/dL (ref 8.7–10.3)
PTH: 44 pg/mL (ref 15–65)

## 2022-01-09 DIAGNOSIS — E876 Hypokalemia: Secondary | ICD-10-CM | POA: Diagnosis not present

## 2022-01-09 DIAGNOSIS — I129 Hypertensive chronic kidney disease with stage 1 through stage 4 chronic kidney disease, or unspecified chronic kidney disease: Secondary | ICD-10-CM | POA: Diagnosis not present

## 2022-01-09 DIAGNOSIS — E1122 Type 2 diabetes mellitus with diabetic chronic kidney disease: Secondary | ICD-10-CM | POA: Diagnosis not present

## 2022-01-09 DIAGNOSIS — I5032 Chronic diastolic (congestive) heart failure: Secondary | ICD-10-CM | POA: Diagnosis not present

## 2022-01-09 DIAGNOSIS — R809 Proteinuria, unspecified: Secondary | ICD-10-CM | POA: Diagnosis not present

## 2022-01-09 DIAGNOSIS — D638 Anemia in other chronic diseases classified elsewhere: Secondary | ICD-10-CM | POA: Diagnosis not present

## 2022-01-09 DIAGNOSIS — E1129 Type 2 diabetes mellitus with other diabetic kidney complication: Secondary | ICD-10-CM | POA: Diagnosis not present

## 2022-01-09 DIAGNOSIS — N189 Chronic kidney disease, unspecified: Secondary | ICD-10-CM | POA: Diagnosis not present

## 2022-01-28 ENCOUNTER — Ambulatory Visit: Payer: Self-pay | Admitting: *Deleted

## 2022-01-28 ENCOUNTER — Encounter: Payer: Self-pay | Admitting: *Deleted

## 2022-01-28 DIAGNOSIS — Z299 Encounter for prophylactic measures, unspecified: Secondary | ICD-10-CM | POA: Diagnosis not present

## 2022-01-28 DIAGNOSIS — D6869 Other thrombophilia: Secondary | ICD-10-CM | POA: Diagnosis not present

## 2022-01-28 DIAGNOSIS — J069 Acute upper respiratory infection, unspecified: Secondary | ICD-10-CM | POA: Diagnosis not present

## 2022-01-28 DIAGNOSIS — I4891 Unspecified atrial fibrillation: Secondary | ICD-10-CM | POA: Diagnosis not present

## 2022-01-28 DIAGNOSIS — I5022 Chronic systolic (congestive) heart failure: Secondary | ICD-10-CM | POA: Diagnosis not present

## 2022-01-28 NOTE — Patient Outreach (Signed)
  Care Coordination   Follow Up Visit Note   01/28/2022 Name: SHALEE PAOLO MRN: [id number] DOB: September 11, 1934  [name] is a 88 y.o. year old female who sees Vyas, Costella Hatcher, MD for primary care. I spoke with  [name] by phone today.  What matters to the patients health and wellness today?  Ongoing self management of chronic medical conditions    Goals      Care Coordination Services (No Follow-up Needed)     Care Coordination Interventions: Assessed social determinant of health barriers Assessed for additional resource & care coordination Provided patient/caregiver with information regarding Care Coordination Services: Services include support from the care team which includes your Nurse Coordinator, Clinical Social Worker, or Pharmacist.  The Care Coordination team is here to help remove barriers to the health concerns and goals most important to you. Care Coordination services are voluntary, and the patient may decline or stop services at any time by request to their care team member.  Encouraged to reach out to [facility] if any needs arise in the future        SDOH assessments and interventions completed:  Yes  SDOH Interventions Today    Flowsheet Row Most Recent Value  SDOH Interventions   Food Insecurity Interventions Intervention Not Indicated  Housing Interventions Intervention Not Indicated  Transportation Interventions Intervention Not Indicated  Financial Strain Interventions Intervention Not Indicated        Care Coordination Interventions:  Yes, provided   Follow up plan: No further intervention required.   Encounter Outcome:  Pt. Visit Completed   Chong Sicilian, BSN, RN-BC RN Care Coordinator [place] Direct Dial: [phone] Main #: [phone]

## 2022-02-04 ENCOUNTER — Encounter (HOSPITAL_COMMUNITY): Admission: RE | Admit: 2022-02-04 | Payer: Medicare Other | Source: Ambulatory Visit

## 2022-02-07 ENCOUNTER — Other Ambulatory Visit: Payer: Self-pay | Admitting: Cardiology

## 2022-02-10 ENCOUNTER — Ambulatory Visit: Payer: Medicare Other | Admitting: Cardiology

## 2022-02-10 DIAGNOSIS — J209 Acute bronchitis, unspecified: Secondary | ICD-10-CM | POA: Diagnosis not present

## 2022-02-10 DIAGNOSIS — Z299 Encounter for prophylactic measures, unspecified: Secondary | ICD-10-CM | POA: Diagnosis not present

## 2022-02-10 DIAGNOSIS — R0981 Nasal congestion: Secondary | ICD-10-CM | POA: Diagnosis not present

## 2022-02-10 NOTE — Telephone Encounter (Signed)
Prescription refill request for Eliquis received. Indication: PAF Last office visit:  07/29/21  Myles Gip MD Scr: 2.82 on 01/01/22 Age: 88 Weight: 54.4kg  Based on above findings Eliquis 2.'5mg'$  twice daily is the appropriate dose.  Refill approved.

## 2022-02-12 ENCOUNTER — Encounter: Payer: Self-pay | Admitting: Cardiology

## 2022-02-12 ENCOUNTER — Ambulatory Visit: Payer: Medicare Other | Attending: Cardiology | Admitting: Cardiology

## 2022-02-12 VITALS — BP 152/50 | HR 68 | Ht 66.0 in | Wt 119.8 lb

## 2022-02-12 DIAGNOSIS — I25119 Atherosclerotic heart disease of native coronary artery with unspecified angina pectoris: Secondary | ICD-10-CM | POA: Insufficient documentation

## 2022-02-12 DIAGNOSIS — I480 Paroxysmal atrial fibrillation: Secondary | ICD-10-CM | POA: Diagnosis not present

## 2022-02-12 DIAGNOSIS — N184 Chronic kidney disease, stage 4 (severe): Secondary | ICD-10-CM | POA: Diagnosis not present

## 2022-02-12 MED ORDER — AMIODARONE HCL 200 MG PO TABS: 100.0000 mg | ORAL_TABLET | Freq: Every day | ORAL | 1 refills | Status: DC

## 2022-02-12 NOTE — Patient Instructions (Addendum)
Medication Instructions:  Your physician has recommended you make the following change in your medication:  Decrease amiodarone to 100 mg daily (break your 200 mg tablet in 1/2) Continue other medications the same  Labwork: none  Testing/Procedures: none  Follow-Up: Your physician recommends that you schedule a follow-up appointment in: 6 months  Any Other Special Instructions Will Be Listed Below (If Applicable).  If you need a refill on your cardiac medications before your next appointment, please call your pharmacy.

## 2022-02-12 NOTE — Progress Notes (Signed)
Cardiology Office Note  Date: 02/12/2022   ID: [name], DOB Oct 18, 1934, MRN [id number]  PCP:  Glenda Chroman, MD  Cardiologist:  Rozann Lesches, MD Electrophysiologist:  None   Chief Complaint  Patient presents with   Cardiac follow-up    History of Present Illness: Crystal [name] is an 88 y.o. female last seen in July 2023.  She is here for a routine visit.  States that she is in the process of treatment for a URI with prednisone and antibiotics, saw her PCP recently.  Has had chest congestion and a mild cough, no fevers or chills.  She does not report any palpitations.  States that heart rates have been fairly slow, not unusual to be in the 50s.  We discussed reducing her amiodarone dosing.  We went over the remainder of her medications which are noted below.  She does not report any bleeding problems on Eliquis.  Continues to follow with nephrology as well.  Her last creatinine was 2.82.  Past Medical History:  Diagnosis Date   Anemia    Bell's palsy    Breast cancer ([place]) 1998   Right mastectomy   CAD (coronary artery disease)    a. s/p STEMI in 01/2019 with DES to LAD b. s/p NSTEMI in 12/2019 with DES to RCA c. cath in 01/2020 showing patent stents   CHF (congestive heart failure) ([place])    a. EF 30-35% in 01/2019 b. 40-45% in 01/2020 c. EF at 65-70% by echo in 05/2020   Chronic kidney disease    Essential hypertension    GERD (gastroesophageal reflux disease)    Gout    History of skin cancer    Squamous cell, left shoulder   Mixed hyperlipidemia    NSTEMI (non-ST elevated myocardial infarction) ([place])    12/2019   Osteopenia    Paroxysmal atrial fibrillation (HCC)    Thyroid nodule    Type 2 diabetes mellitus (HCC)     Current Outpatient Medications  Medication Sig Dispense Refill   acetaminophen (TYLENOL) 500 MG tablet Take 500 mg by mouth every 6 (six) hours as needed for headache (pain).     amLODipine (NORVASC) 5 MG tablet Take 5 mg by mouth  daily.     apixaban (ELIQUIS) 2.5 MG TABS tablet TAKE (1) TABLET TWICE DAILY. 60 tablet 5   busPIRone (BUSPAR) 5 MG tablet Take 5 mg by mouth as needed.     CALCIUM CARB-CHOLECALCIFEROL PO Take 1 tablet by mouth daily.     carvedilol (COREG) 6.25 MG tablet TAKE (1) TABLET TWICE DAILY. 120 tablet 1   cholecalciferol (VITAMIN D3) 25 MCG (1000 UT) tablet Take 1,000 Units by mouth daily with breakfast.     diazepam (VALIUM) 2 MG tablet Take 2 mg by mouth 2 (two) times daily as needed for anxiety.     docusate sodium (COLACE) 100 MG capsule Take 100 mg by mouth daily as needed for mild constipation.     ferrous sulfate 325 (65 FE) MG tablet Take 325 mg by mouth daily before lunch.     hydrALAZINE (APRESOLINE) 100 MG tablet Take 1 tablet (100 mg total) by mouth in the morning, at noon, and at bedtime. 270 tablet 1   isosorbide mononitrate (IMDUR) 120 MG 24 hr tablet TAKE 1 TABLET BY MOUTH AT BEDTIME. 30 tablet 0   loperamide (IMODIUM) 2 MG capsule Take 2 mg by mouth as needed for diarrhea or loose stools.     meclizine (  ANTIVERT) 25 MG tablet Take 25 mg by mouth as needed for dizziness.     Multiple Vitamin (MULTIVITAMIN WITH MINERALS) TABS tablet Take 1 tablet by mouth daily. Centrum Silver for Women     multivitamin-lutein (OCUVITE-LUTEIN) CAPS capsule Take 1 capsule by mouth daily before lunch.      nitroGLYCERIN (NITROSTAT) 0.4 MG SL tablet PLACE 1 TAB UNDER TONGUE EVERY 3 MINUTES AS DIRECTED UP TO 3 TIMES AS NEEDED FOR CHEST PAIN. 25 tablet 0   pantoprazole (PROTONIX) 40 MG tablet Take 1 tablet by mouth daily.     Simethicone (GAS-X PO) Take 1 capsule by mouth daily as needed (gas).     sodium chloride (OCEAN) 0.65 % SOLN nasal spray Place 1 spray into both nostrils as needed for congestion.     spironolactone (ALDACTONE) 25 MG tablet Take 12.5 mg by mouth daily.     torsemide (DEMADEX) 20 MG tablet Take 20 mg by mouth daily.     traZODone (DESYREL) 50 MG tablet Take 50 mg by mouth as needed for  sleep.     amiodarone (PACERONE) 200 MG tablet Take 0.5 tablets (100 mg total) by mouth daily. 45 tablet 1   No current facility-administered medications for this visit.   Allergies:  Bactrim [sulfamethoxazole-trimethoprim], Sulfa antibiotics, Clonidine derivatives, Evista [raloxifene hcl], Fosamax [alendronate sodium], Glipizide, Lipitor [atorvastatin calcium], Losartan, Pravastatin, and Azithromycin   ROS: No leg swelling.  No syncope.  Physical Exam: VS:  BP (!) 152/50   Pulse 68   Ht '5\' 6"'$  (1.676 m)   Wt 119 lb 12.8 oz (54.3 kg)   SpO2 96%   BMI 19.34 kg/m , BMI Body mass index is 19.34 kg/m.  Wt Readings from Last 3 Encounters:  02/12/22 119 lb 12.8 oz (54.3 kg)  11/07/21 115 lb (52.2 kg)  09/30/21 115 lb 8.3 oz (52.4 kg)    General: Patient appears comfortable at rest. HEENT: Conjunctiva and lids normal. Neck: Supple, no elevated JVP or carotid bruits. Lungs: Clear to auscultation, nonlabored breathing at rest. Cardiac: Regular rate and rhythm, no S3, 2/6 systolic murmur. Extremities: No pitting edema.  ECG:  An ECG dated 09/28/2021 was personally reviewed today and demonstrated:  Sinus bradycardia with left atrial enlargement, decreased R wave progression.  Recent Labwork: 09/28/2021: ALT 35; AST 39; B Natriuretic Peptide 591.0 10/02/2021: TSH 0.662 10/03/2021: Magnesium 1.9 01/01/2022: BUN 66; Creatinine, Ser 2.82; Hemoglobin 10.2; Platelets 277; Potassium 4.5; Sodium 134  November 2023: Cholesterol 250, triglycerides 96, HDL 75, LDL 159, TSH 1.21, AST 20, ALT 7  Other Studies Reviewed Today:  Echocardiogram 09/29/2021:  1. Left ventricular ejection fraction, by estimation, is 60 to 65%. The  left ventricle has normal function. The left ventricle has no regional  wall motion abnormalities. Left ventricular diastolic parameters were  normal.   2. Right ventricular systolic function is normal. The right ventricular  size is normal. There is normal pulmonary artery systolic  pressure.   3. The mitral valve is degenerative. Trivial mitral valve regurgitation.  No evidence of mitral stenosis. Moderate mitral annular calcification.   4. The aortic valve is tricuspid. There is mild calcification of the  aortic valve. There is mild thickening of the aortic valve. Aortic valve  regurgitation is mild. Aortic valve sclerosis/calcification is present,  without any evidence of aortic  stenosis.   5. The inferior vena cava is dilated in size with >50% respiratory  variability, suggesting right atrial pressure of 8 mmHg.   Assessment and Plan:  1.  Paroxysmal atrial fibrillation with CHA2DS2-VASc score of 7.  Plan to reduce amiodarone to 100 mg daily as discussed above, otherwise continue current doses of Coreg and Eliquis.  I reviewed her recent lab work.  2.  CAD status post DES to the LAD in January 2021 and DES to the mid RCA in December 2021.  No active angina at this time on medical therapy.  3.  CKD stage IV, creatinine 2.82 with potassium 4.5.  Keep follow-up with Dr. Theador Hawthorne.  Medication Adjustments/Labs and Tests Ordered: Current medicines are reviewed at length with the patient today.  Concerns regarding medicines are outlined above.   Tests Ordered: No orders of the defined types were placed in this encounter.   Medication Changes: Meds ordered this encounter  Medications   amiodarone (PACERONE) 200 MG tablet    Sig: Take 0.5 tablets (100 mg total) by mouth daily.    Dispense:  45 tablet    Refill:  1    02/12/2022 dose decrease    Disposition:  Follow up  6 months.  Signed, Satira Sark, MD, [facility] 02/12/2022 4:09 PM    [facility] [place], [place], [place] [address] Phone: [phone]; Fax: [phone]

## 2022-02-26 DIAGNOSIS — I5022 Chronic systolic (congestive) heart failure: Secondary | ICD-10-CM | POA: Diagnosis not present

## 2022-02-26 DIAGNOSIS — I10 Essential (primary) hypertension: Secondary | ICD-10-CM | POA: Diagnosis not present

## 2022-02-28 DIAGNOSIS — I700 Atherosclerosis of aorta: Secondary | ICD-10-CM | POA: Diagnosis not present

## 2022-02-28 DIAGNOSIS — I10 Essential (primary) hypertension: Secondary | ICD-10-CM | POA: Diagnosis not present

## 2022-02-28 DIAGNOSIS — Z299 Encounter for prophylactic measures, unspecified: Secondary | ICD-10-CM | POA: Diagnosis not present

## 2022-02-28 DIAGNOSIS — N184 Chronic kidney disease, stage 4 (severe): Secondary | ICD-10-CM | POA: Diagnosis not present

## 2022-02-28 DIAGNOSIS — I5022 Chronic systolic (congestive) heart failure: Secondary | ICD-10-CM | POA: Diagnosis not present

## 2022-03-06 ENCOUNTER — Encounter (HOSPITAL_COMMUNITY)
Admission: RE | Admit: 2022-03-06 | Discharge: 2022-03-06 | Disposition: A | Discharge status: Home / Self Care | Payer: Medicare Other | Source: Ambulatory Visit | Attending: Nephrology | Admitting: Nephrology

## 2022-03-06 DIAGNOSIS — D631 Anemia in chronic kidney disease: Secondary | ICD-10-CM | POA: Insufficient documentation

## 2022-03-06 DIAGNOSIS — N184 Chronic kidney disease, stage 4 (severe): Secondary | ICD-10-CM | POA: Diagnosis not present

## 2022-03-06 LAB — CBC WITH DIFFERENTIAL/PLATELET
Abs Immature Granulocytes: 0.14 10*3/uL — ABNORMAL HIGH (ref 0.00–0.07)
Basophils Absolute: 0.1 10*3/uL (ref 0.0–0.1)
Basophils Relative: 1 %
Eosinophils Absolute: 0.2 10*3/uL (ref 0.0–0.5)
Eosinophils Relative: 3 %
HCT: 32.6 % — ABNORMAL LOW (ref 36.0–46.0)
Hemoglobin: 10.7 g/dL — ABNORMAL LOW (ref 12.0–15.0)
Immature Granulocytes: 2 %
Lymphocytes Relative: 20 %
Lymphs Abs: 1.2 10*3/uL (ref 0.7–4.0)
MCH: 28.9 pg (ref 26.0–34.0)
MCHC: 32.8 g/dL (ref 30.0–36.0)
MCV: 88.1 fL (ref 80.0–100.0)
Monocytes Absolute: 1 10*3/uL (ref 0.1–1.0)
Monocytes Relative: 16 %
Neutro Abs: 3.7 10*3/uL (ref 1.7–7.7)
Neutrophils Relative %: 58 %
Platelets: 285 10*3/uL (ref 150–400)
RBC: 3.7 MIL/uL — ABNORMAL LOW (ref 3.87–5.11)
RDW: 14.9 % (ref 11.5–15.5)
WBC: 6.2 10*3/uL (ref 4.0–10.5)
nRBC: 0 % (ref 0.0–0.2)

## 2022-03-06 LAB — RENAL FUNCTION PANEL
Albumin: 3.6 g/dL (ref 3.5–5.0)
Anion gap: 11 (ref 5–15)
BUN: 73 mg/dL — ABNORMAL HIGH (ref 8–23)
CO2: 23 mmol/L (ref 22–32)
Calcium: 9.2 mg/dL (ref 8.9–10.3)
Chloride: 97 mmol/L — ABNORMAL LOW (ref 98–111)
Creatinine, Ser: 2.41 mg/dL — ABNORMAL HIGH (ref 0.44–1.00)
GFR, Estimated: 19 mL/min — ABNORMAL LOW (ref >60)
Glucose, Bld: 115 mg/dL — ABNORMAL HIGH (ref 70–99)
Phosphorus: 3.9 mg/dL (ref 2.5–4.6)
Potassium: 5 mmol/L (ref 3.5–5.1)
Sodium: 131 mmol/L — ABNORMAL LOW (ref 135–145)

## 2022-03-06 LAB — IRON AND TIBC
Iron: 61 ug/dL (ref 28–170)
Saturation Ratios: 17 % (ref 10.4–31.8)
TIBC: 355 ug/dL (ref 250–450)
UIBC: 294 ug/dL

## 2022-03-06 LAB — PROTEIN / CREATININE RATIO, URINE
Creatinine, Urine: 25.79 mg/dL
Total Protein, Urine: <6 mg/dL

## 2022-03-06 LAB — FERRITIN: Ferritin: 133 ng/mL (ref 11–307)

## 2022-03-06 LAB — POCT HEMOGLOBIN-HEMACUE: Hemoglobin: 10.9 g/dL — ABNORMAL LOW (ref 12.0–15.0)

## 2022-03-06 MED ORDER — EPOETIN ALFA-EPBX 3000 UNIT/ML IJ SOLN: 3000.0000 [IU] | Freq: Once | INTRAMUSCULAR | Status: DC

## 2022-03-06 NOTE — Progress Notes (Signed)
Diagnosis: Anemia in Chronic Kidney Disease  Provider:  Manpreet Bhutani MD  Procedure: Injection  None given, Dose: N/A , Number of injections: none @ this time  Hgb 10.9  Post Care: Observation period completed  Discharge: Condition: Good, Destination: Home . AVS Provided  Performed by:  Oren Beckmann, RN

## 2022-03-07 LAB — PTH, INTACT AND CALCIUM
Calcium, Total (PTH): 9.1 mg/dL (ref 8.7–10.3)
PTH: 43 pg/mL (ref 15–65)

## 2022-03-09 ENCOUNTER — Other Ambulatory Visit: Payer: Self-pay | Admitting: Cardiology

## 2022-03-19 DIAGNOSIS — E1122 Type 2 diabetes mellitus with diabetic chronic kidney disease: Secondary | ICD-10-CM | POA: Diagnosis not present

## 2022-03-19 DIAGNOSIS — N189 Chronic kidney disease, unspecified: Secondary | ICD-10-CM | POA: Diagnosis not present

## 2022-03-19 DIAGNOSIS — I129 Hypertensive chronic kidney disease with stage 1 through stage 4 chronic kidney disease, or unspecified chronic kidney disease: Secondary | ICD-10-CM | POA: Diagnosis not present

## 2022-03-19 DIAGNOSIS — I5032 Chronic diastolic (congestive) heart failure: Secondary | ICD-10-CM | POA: Diagnosis not present

## 2022-03-19 DIAGNOSIS — E876 Hypokalemia: Secondary | ICD-10-CM | POA: Diagnosis not present

## 2022-03-19 DIAGNOSIS — E1129 Type 2 diabetes mellitus with other diabetic kidney complication: Secondary | ICD-10-CM | POA: Diagnosis not present

## 2022-03-19 DIAGNOSIS — D638 Anemia in other chronic diseases classified elsewhere: Secondary | ICD-10-CM | POA: Diagnosis not present

## 2022-03-19 DIAGNOSIS — R809 Proteinuria, unspecified: Secondary | ICD-10-CM | POA: Diagnosis not present

## 2022-05-08 DIAGNOSIS — Z299 Encounter for prophylactic measures, unspecified: Secondary | ICD-10-CM | POA: Diagnosis not present

## 2022-05-08 DIAGNOSIS — T148XXA Other injury of unspecified body region, initial encounter: Secondary | ICD-10-CM | POA: Diagnosis not present

## 2022-05-08 DIAGNOSIS — I10 Essential (primary) hypertension: Secondary | ICD-10-CM | POA: Diagnosis not present

## 2022-05-15 DIAGNOSIS — T148XXA Other injury of unspecified body region, initial encounter: Secondary | ICD-10-CM | POA: Diagnosis not present

## 2022-05-15 DIAGNOSIS — Z299 Encounter for prophylactic measures, unspecified: Secondary | ICD-10-CM | POA: Diagnosis not present

## 2022-05-15 DIAGNOSIS — I10 Essential (primary) hypertension: Secondary | ICD-10-CM | POA: Diagnosis not present

## 2022-05-22 ENCOUNTER — Other Ambulatory Visit (HOSPITAL_COMMUNITY)
Admission: RE | Admit: 2022-05-22 | Discharge: 2022-05-22 | Disposition: A | Discharge status: Home / Self Care | Payer: Medicare Other | Source: Ambulatory Visit | Attending: Nephrology | Admitting: Nephrology

## 2022-05-22 DIAGNOSIS — E876 Hypokalemia: Secondary | ICD-10-CM | POA: Diagnosis not present

## 2022-05-22 DIAGNOSIS — E1122 Type 2 diabetes mellitus with diabetic chronic kidney disease: Secondary | ICD-10-CM | POA: Insufficient documentation

## 2022-05-22 DIAGNOSIS — I5032 Chronic diastolic (congestive) heart failure: Secondary | ICD-10-CM | POA: Insufficient documentation

## 2022-05-22 DIAGNOSIS — I129 Hypertensive chronic kidney disease with stage 1 through stage 4 chronic kidney disease, or unspecified chronic kidney disease: Secondary | ICD-10-CM | POA: Diagnosis not present

## 2022-05-22 DIAGNOSIS — N189 Chronic kidney disease, unspecified: Secondary | ICD-10-CM | POA: Diagnosis not present

## 2022-05-22 DIAGNOSIS — D638 Anemia in other chronic diseases classified elsewhere: Secondary | ICD-10-CM | POA: Diagnosis not present

## 2022-05-22 DIAGNOSIS — R809 Proteinuria, unspecified: Secondary | ICD-10-CM | POA: Diagnosis not present

## 2022-05-22 LAB — RENAL FUNCTION PANEL
Albumin: 3.8 g/dL (ref 3.5–5.0)
Anion gap: 10 (ref 5–15)
BUN: 68 mg/dL — ABNORMAL HIGH (ref 8–23)
CO2: 23 mmol/L (ref 22–32)
Calcium: 9.2 mg/dL (ref 8.9–10.3)
Chloride: 101 mmol/L (ref 98–111)
Creatinine, Ser: 2.65 mg/dL — ABNORMAL HIGH (ref 0.44–1.00)
GFR, Estimated: 17 mL/min — ABNORMAL LOW (ref >60)
Glucose, Bld: 125 mg/dL — ABNORMAL HIGH (ref 70–99)
Phosphorus: 3.9 mg/dL (ref 2.5–4.6)
Potassium: 4.1 mmol/L (ref 3.5–5.1)
Sodium: 134 mmol/L — ABNORMAL LOW (ref 135–145)

## 2022-05-22 LAB — IRON AND TIBC
Iron: 58 ug/dL (ref 28–170)
Saturation Ratios: 15 % (ref 10.4–31.8)
TIBC: 385 ug/dL (ref 250–450)
UIBC: 327 ug/dL

## 2022-05-22 LAB — CBC
HCT: 30.7 % — ABNORMAL LOW (ref 36.0–46.0)
Hemoglobin: 10.1 g/dL — ABNORMAL LOW (ref 12.0–15.0)
MCH: 29.2 pg (ref 26.0–34.0)
MCHC: 32.9 g/dL (ref 30.0–36.0)
MCV: 88.7 fL (ref 80.0–100.0)
Platelets: 281 10*3/uL (ref 150–400)
RBC: 3.46 MIL/uL — ABNORMAL LOW (ref 3.87–5.11)
RDW: 14.1 % (ref 11.5–15.5)
WBC: 7 10*3/uL (ref 4.0–10.5)
nRBC: 0 % (ref 0.0–0.2)

## 2022-05-22 LAB — PROTEIN / CREATININE RATIO, URINE
Creatinine, Urine: 40 mg/dL
Total Protein, Urine: <6 mg/dL

## 2022-05-22 LAB — FERRITIN: Ferritin: 45 ng/mL (ref 11–307)

## 2022-05-28 DIAGNOSIS — N189 Chronic kidney disease, unspecified: Secondary | ICD-10-CM | POA: Diagnosis not present

## 2022-05-28 DIAGNOSIS — E1122 Type 2 diabetes mellitus with diabetic chronic kidney disease: Secondary | ICD-10-CM | POA: Diagnosis not present

## 2022-05-28 DIAGNOSIS — D638 Anemia in other chronic diseases classified elsewhere: Secondary | ICD-10-CM | POA: Diagnosis not present

## 2022-06-03 DIAGNOSIS — I10 Essential (primary) hypertension: Secondary | ICD-10-CM | POA: Diagnosis not present

## 2022-06-03 DIAGNOSIS — L98499 Non-pressure chronic ulcer of skin of other sites with unspecified severity: Secondary | ICD-10-CM | POA: Diagnosis not present

## 2022-06-03 DIAGNOSIS — Z299 Encounter for prophylactic measures, unspecified: Secondary | ICD-10-CM | POA: Diagnosis not present

## 2022-06-03 DIAGNOSIS — I5022 Chronic systolic (congestive) heart failure: Secondary | ICD-10-CM | POA: Diagnosis not present

## 2022-06-11 ENCOUNTER — Other Ambulatory Visit: Payer: Self-pay | Admitting: Cardiology

## 2022-06-11 DIAGNOSIS — I480 Paroxysmal atrial fibrillation: Secondary | ICD-10-CM

## 2022-06-11 NOTE — Telephone Encounter (Signed)
Eliquis 2.5mg  refill request received. Patient is 88 years old, weight-54.3kg, Crea-2.65 on 05/22/22, Diagnosis-Afib, and last seen by Dr. Simona Huh on 02/12/22. Dose is appropriate based on dosing criteria. Will send in refill to requested pharmacy.

## 2022-06-17 DIAGNOSIS — I10 Essential (primary) hypertension: Secondary | ICD-10-CM | POA: Diagnosis not present

## 2022-06-17 DIAGNOSIS — F419 Anxiety disorder, unspecified: Secondary | ICD-10-CM | POA: Diagnosis not present

## 2022-06-17 DIAGNOSIS — I5022 Chronic systolic (congestive) heart failure: Secondary | ICD-10-CM | POA: Diagnosis not present

## 2022-06-17 DIAGNOSIS — L98499 Non-pressure chronic ulcer of skin of other sites with unspecified severity: Secondary | ICD-10-CM | POA: Diagnosis not present

## 2022-06-17 DIAGNOSIS — Z299 Encounter for prophylactic measures, unspecified: Secondary | ICD-10-CM | POA: Diagnosis not present

## 2022-06-17 DIAGNOSIS — R079 Chest pain, unspecified: Secondary | ICD-10-CM | POA: Diagnosis not present

## 2022-07-14 ENCOUNTER — Other Ambulatory Visit (HOSPITAL_COMMUNITY)
Admission: RE | Admit: 2022-07-14 | Discharge: 2022-07-14 | Disposition: A | Discharge status: Home / Self Care | Payer: Medicare Other | Source: Ambulatory Visit | Attending: Nephrology | Admitting: Nephrology

## 2022-07-14 DIAGNOSIS — E1122 Type 2 diabetes mellitus with diabetic chronic kidney disease: Secondary | ICD-10-CM | POA: Insufficient documentation

## 2022-07-14 DIAGNOSIS — D631 Anemia in chronic kidney disease: Secondary | ICD-10-CM | POA: Diagnosis not present

## 2022-07-14 DIAGNOSIS — R809 Proteinuria, unspecified: Secondary | ICD-10-CM | POA: Insufficient documentation

## 2022-07-14 DIAGNOSIS — Z79899 Other long term (current) drug therapy: Secondary | ICD-10-CM | POA: Diagnosis not present

## 2022-07-14 LAB — CBC WITH DIFFERENTIAL/PLATELET
Abs Immature Granulocytes: 0.13 10*3/uL — ABNORMAL HIGH (ref 0.00–0.07)
Basophils Absolute: 0.1 10*3/uL (ref 0.0–0.1)
Basophils Relative: 1 %
Eosinophils Absolute: 0.2 10*3/uL (ref 0.0–0.5)
Eosinophils Relative: 3 %
HCT: 27.6 % — ABNORMAL LOW (ref 36.0–46.0)
Hemoglobin: 8.9 g/dL — ABNORMAL LOW (ref 12.0–15.0)
Immature Granulocytes: 2 %
Lymphocytes Relative: 21 %
Lymphs Abs: 1.4 10*3/uL (ref 0.7–4.0)
MCH: 28.9 pg (ref 26.0–34.0)
MCHC: 32.2 g/dL (ref 30.0–36.0)
MCV: 89.6 fL (ref 80.0–100.0)
Monocytes Absolute: 1 10*3/uL (ref 0.1–1.0)
Monocytes Relative: 15 %
Neutro Abs: 4.1 10*3/uL (ref 1.7–7.7)
Neutrophils Relative %: 58 %
Platelets: 331 10*3/uL (ref 150–400)
RBC: 3.08 MIL/uL — ABNORMAL LOW (ref 3.87–5.11)
RDW: 14.5 % (ref 11.5–15.5)
WBC: 6.8 10*3/uL (ref 4.0–10.5)
nRBC: 0 % (ref 0.0–0.2)

## 2022-07-14 LAB — RENAL FUNCTION PANEL
Albumin: 3.7 g/dL (ref 3.5–5.0)
Anion gap: 12 (ref 5–15)
BUN: 77 mg/dL — ABNORMAL HIGH (ref 8–23)
CO2: 22 mmol/L (ref 22–32)
Calcium: 9 mg/dL (ref 8.9–10.3)
Chloride: 99 mmol/L (ref 98–111)
Creatinine, Ser: 3.19 mg/dL — ABNORMAL HIGH (ref 0.44–1.00)
GFR, Estimated: 14 mL/min — ABNORMAL LOW (ref >60)
Glucose, Bld: 177 mg/dL — ABNORMAL HIGH (ref 70–99)
Phosphorus: 4.2 mg/dL (ref 2.5–4.6)
Potassium: 4.7 mmol/L (ref 3.5–5.1)
Sodium: 133 mmol/L — ABNORMAL LOW (ref 135–145)

## 2022-07-14 LAB — PROTEIN / CREATININE RATIO, URINE
Creatinine, Urine: 44 mg/dL
Total Protein, Urine: <6 mg/dL

## 2022-07-14 LAB — IRON AND TIBC
Iron: 51 ug/dL (ref 28–170)
Saturation Ratios: 13 % (ref 10.4–31.8)
TIBC: 391 ug/dL (ref 250–450)
UIBC: 340 ug/dL

## 2022-07-14 LAB — FERRITIN: Ferritin: 35 ng/mL (ref 11–307)

## 2022-07-18 DIAGNOSIS — H52203 Unspecified astigmatism, bilateral: Secondary | ICD-10-CM | POA: Diagnosis not present

## 2022-07-18 DIAGNOSIS — Z961 Presence of intraocular lens: Secondary | ICD-10-CM | POA: Diagnosis not present

## 2022-07-18 DIAGNOSIS — H353232 Exudative age-related macular degeneration, bilateral, with inactive choroidal neovascularization: Secondary | ICD-10-CM | POA: Diagnosis not present

## 2022-07-18 DIAGNOSIS — H26493 Other secondary cataract, bilateral: Secondary | ICD-10-CM | POA: Diagnosis not present

## 2022-07-21 DIAGNOSIS — E1122 Type 2 diabetes mellitus with diabetic chronic kidney disease: Secondary | ICD-10-CM | POA: Diagnosis not present

## 2022-07-21 DIAGNOSIS — I5032 Chronic diastolic (congestive) heart failure: Secondary | ICD-10-CM | POA: Diagnosis not present

## 2022-07-21 DIAGNOSIS — D631 Anemia in chronic kidney disease: Secondary | ICD-10-CM | POA: Diagnosis not present

## 2022-07-21 DIAGNOSIS — E875 Hyperkalemia: Secondary | ICD-10-CM | POA: Diagnosis not present

## 2022-07-29 ENCOUNTER — Ambulatory Visit: Payer: Medicare Other | Attending: Cardiology | Admitting: Cardiology

## 2022-07-29 ENCOUNTER — Encounter: Payer: Self-pay | Admitting: Cardiology

## 2022-07-29 VITALS — BP 150/48 | HR 56 | Ht 66.0 in | Wt 129.0 lb

## 2022-07-29 DIAGNOSIS — N184 Chronic kidney disease, stage 4 (severe): Secondary | ICD-10-CM | POA: Diagnosis not present

## 2022-07-29 DIAGNOSIS — I480 Paroxysmal atrial fibrillation: Secondary | ICD-10-CM | POA: Insufficient documentation

## 2022-07-29 DIAGNOSIS — I10 Essential (primary) hypertension: Secondary | ICD-10-CM | POA: Insufficient documentation

## 2022-07-29 DIAGNOSIS — I25119 Atherosclerotic heart disease of native coronary artery with unspecified angina pectoris: Secondary | ICD-10-CM | POA: Insufficient documentation

## 2022-07-29 NOTE — Progress Notes (Signed)
Cardiology Office Note  Date: 07/29/2022   ID: Crystal [name], DOB Sep 12, 1934, MRN [id number]  History of Present Illness: Crystal [name] is an 88 y.o. female last seen in January.  She is here for a routine visit.  Reports no increasing angina or signs of palpitations on current regimen.  She has required more assistance with cleaning and yard work, considering building a small house on her family's property in [place] to be close to family.  I reviewed her medications which are stable from a cardiac perspective.  Diuretics have been down titrated by Dr. Wolfgang Phoenix.  I reviewed her most recent lab work from June.  She does not report any nitroglycerin use.  No spontaneous bleeding problems on Eliquis although she does bruise easily.  Physical Exam: VS:  BP (!) 150/48   Pulse (!) 56   Ht 5\' 6"  (1.676 m)   Wt 129 lb (58.5 kg)   SpO2 99%   BMI 20.82 kg/m , BMI Body mass index is 20.82 kg/m.  Wt Readings from Last 3 Encounters:  07/29/22 129 lb (58.5 kg)  02/12/22 119 lb 12.8 oz (54.3 kg)  11/07/21 115 lb (52.2 kg)    General: Patient appears comfortable at rest. HEENT: Conjunctiva and lids normal. Neck: Supple, no elevated JVP or carotid bruits. Lungs: Clear to auscultation, nonlabored breathing at rest. Cardiac: Regular rate and rhythm, no S3, 2/6 systolic murmur. Extremities: No pitting edema.  ECG:  An ECG dated 09/28/2021 was personally reviewed today and demonstrated:  Sinus bradycardia with left atrial enlargement, decreased R wave progression.  Labwork: 09/28/2021: ALT 35; AST 39; B Natriuretic Peptide 591.0 10/02/2021: TSH 0.662 10/03/2021: Magnesium 1.9 07/14/2022: BUN 77; Creatinine, Ser 3.19; Hemoglobin 8.9; Platelets 331; Potassium 4.7; Sodium 133  November 2023: Cholesterol 250, triglycerides 96, HDL 75, LDL 159  Other Studies Reviewed Today:  Echocardiogram 09/29/2021:  1. Left ventricular ejection fraction, by estimation, is 60 to 65%. The  left ventricle  has normal function. The left ventricle has no regional  wall motion abnormalities. Left ventricular diastolic parameters were  normal.   2. Right ventricular systolic function is normal. The right ventricular  size is normal. There is normal pulmonary artery systolic pressure.   3. The mitral valve is degenerative. Trivial mitral valve regurgitation.  No evidence of mitral stenosis. Moderate mitral annular calcification.   4. The aortic valve is tricuspid. There is mild calcification of the  aortic valve. There is mild thickening of the aortic valve. Aortic valve  regurgitation is mild. Aortic valve sclerosis/calcification is present,  without any evidence of aortic  stenosis.   5. The inferior vena cava is dilated in size with >50% respiratory  variability, suggesting right atrial pressure of 8 mmHg.   Assessment and Plan:  1.  CAD status post DES to the LAD and January 2021, DES to the RCA in December 2021, and patent stent sites by follow-up angiography in January 2022.  LVEF 60 to 65% by echocardiogram in September 2023.  She does not report any progressive angina at this time.  Not on aspirin given concurrent use of Eliquis.  Continue Coreg, Norvasc, Imdur, and as needed nitroglycerin.  2.  Paroxysmal atrial fibrillation with CHA2DS2-VASc score of 7.  She is symptomatically stable and remains on low-dose amiodarone along with Eliquis for stroke prophylaxis.  3.  Mixed hyperlipidemia.  LDL 159 in November 2023.  She has a history of statin intolerance and has preferred to hold off on other agents.  4.  Essential hypertension.  Continue multimodal therapy.  5.  CKD stage IV.  Recent creatinine 3.19.  She follows with Dr. Wolfgang Phoenix.  Disposition:  Follow up  6 months.  Signed, Jonelle Sidle, M.D., F.A.C.C. [facility]

## 2022-07-29 NOTE — Patient Instructions (Addendum)

## 2022-08-12 ENCOUNTER — Other Ambulatory Visit: Payer: Self-pay

## 2022-08-19 ENCOUNTER — Other Ambulatory Visit: Payer: Self-pay | Admitting: *Deleted

## 2022-08-19 MED ORDER — HYDRALAZINE HCL 100 MG PO TABS: 100.0000 mg | ORAL_TABLET | Freq: Three times a day (TID) | ORAL | 2 refills | Status: DC

## 2022-08-21 DIAGNOSIS — M19079 Primary osteoarthritis, unspecified ankle and foot: Secondary | ICD-10-CM | POA: Diagnosis not present

## 2022-08-21 DIAGNOSIS — Z299 Encounter for prophylactic measures, unspecified: Secondary | ICD-10-CM | POA: Diagnosis not present

## 2022-08-21 DIAGNOSIS — I10 Essential (primary) hypertension: Secondary | ICD-10-CM | POA: Diagnosis not present

## 2022-08-21 DIAGNOSIS — N184 Chronic kidney disease, stage 4 (severe): Secondary | ICD-10-CM | POA: Diagnosis not present

## 2022-08-21 DIAGNOSIS — I700 Atherosclerosis of aorta: Secondary | ICD-10-CM | POA: Diagnosis not present

## 2022-09-03 ENCOUNTER — Other Ambulatory Visit (HOSPITAL_COMMUNITY)
Admission: RE | Admit: 2022-09-03 | Discharge: 2022-09-03 | Disposition: A | Discharge status: Home / Self Care | Payer: Medicare Other | Source: Ambulatory Visit | Attending: Nephrology | Admitting: Nephrology

## 2022-09-03 DIAGNOSIS — D472 Monoclonal gammopathy: Secondary | ICD-10-CM | POA: Insufficient documentation

## 2022-09-03 DIAGNOSIS — E1129 Type 2 diabetes mellitus with other diabetic kidney complication: Secondary | ICD-10-CM | POA: Diagnosis not present

## 2022-09-03 DIAGNOSIS — D631 Anemia in chronic kidney disease: Secondary | ICD-10-CM | POA: Diagnosis not present

## 2022-09-03 DIAGNOSIS — N184 Chronic kidney disease, stage 4 (severe): Secondary | ICD-10-CM | POA: Diagnosis not present

## 2022-09-03 DIAGNOSIS — D509 Iron deficiency anemia, unspecified: Secondary | ICD-10-CM | POA: Insufficient documentation

## 2022-09-03 DIAGNOSIS — N179 Acute kidney failure, unspecified: Secondary | ICD-10-CM | POA: Diagnosis not present

## 2022-09-03 DIAGNOSIS — R809 Proteinuria, unspecified: Secondary | ICD-10-CM | POA: Insufficient documentation

## 2022-09-03 LAB — CBC WITH DIFFERENTIAL/PLATELET
Abs Immature Granulocytes: 0.08 10*3/uL — ABNORMAL HIGH (ref 0.00–0.07)
Basophils Absolute: 0.1 10*3/uL (ref 0.0–0.1)
Basophils Relative: 1 %
Eosinophils Absolute: 0.2 10*3/uL (ref 0.0–0.5)
Eosinophils Relative: 2 %
HCT: 28 % — ABNORMAL LOW (ref 36.0–46.0)
Hemoglobin: 8.9 g/dL — ABNORMAL LOW (ref 12.0–15.0)
Immature Granulocytes: 1 %
Lymphocytes Relative: 20 %
Lymphs Abs: 1.6 10*3/uL (ref 0.7–4.0)
MCH: 27.9 pg (ref 26.0–34.0)
MCHC: 31.8 g/dL (ref 30.0–36.0)
MCV: 87.8 fL (ref 80.0–100.0)
Monocytes Absolute: 1.1 10*3/uL — ABNORMAL HIGH (ref 0.1–1.0)
Monocytes Relative: 14 %
Neutro Abs: 4.9 10*3/uL (ref 1.7–7.7)
Neutrophils Relative %: 62 %
Platelets: 315 10*3/uL (ref 150–400)
RBC: 3.19 MIL/uL — ABNORMAL LOW (ref 3.87–5.11)
RDW: 14.4 % (ref 11.5–15.5)
WBC: 8 10*3/uL (ref 4.0–10.5)
nRBC: 0 % (ref 0.0–0.2)

## 2022-09-03 LAB — RENAL FUNCTION PANEL
Albumin: 3.9 g/dL (ref 3.5–5.0)
Anion gap: 11 (ref 5–15)
BUN: 67 mg/dL — ABNORMAL HIGH (ref 8–23)
CO2: 22 mmol/L (ref 22–32)
Calcium: 8.9 mg/dL (ref 8.9–10.3)
Chloride: 99 mmol/L (ref 98–111)
Creatinine, Ser: 2.92 mg/dL — ABNORMAL HIGH (ref 0.44–1.00)
GFR, Estimated: 15 mL/min — ABNORMAL LOW (ref >60)
Glucose, Bld: 97 mg/dL (ref 70–99)
Phosphorus: 4.5 mg/dL (ref 2.5–4.6)
Potassium: 4.5 mmol/L (ref 3.5–5.1)
Sodium: 132 mmol/L — ABNORMAL LOW (ref 135–145)

## 2022-09-03 LAB — PROTEIN / CREATININE RATIO, URINE
Creatinine, Urine: 49 mg/dL
Total Protein, Urine: <6 mg/dL

## 2022-09-03 LAB — IRON AND TIBC
Iron: 49 ug/dL (ref 28–170)
Saturation Ratios: 12 % (ref 10.4–31.8)
TIBC: 404 ug/dL (ref 250–450)
UIBC: 355 ug/dL

## 2022-09-03 LAB — FERRITIN: Ferritin: 27 ng/mL (ref 11–307)

## 2022-09-15 DIAGNOSIS — E1122 Type 2 diabetes mellitus with diabetic chronic kidney disease: Secondary | ICD-10-CM | POA: Diagnosis not present

## 2022-09-15 DIAGNOSIS — N184 Chronic kidney disease, stage 4 (severe): Secondary | ICD-10-CM | POA: Diagnosis not present

## 2022-09-15 DIAGNOSIS — D631 Anemia in chronic kidney disease: Secondary | ICD-10-CM | POA: Diagnosis not present

## 2022-09-15 DIAGNOSIS — N179 Acute kidney failure, unspecified: Secondary | ICD-10-CM | POA: Diagnosis not present

## 2022-09-16 DIAGNOSIS — I5022 Chronic systolic (congestive) heart failure: Secondary | ICD-10-CM | POA: Diagnosis not present

## 2022-09-16 DIAGNOSIS — D6869 Other thrombophilia: Secondary | ICD-10-CM | POA: Diagnosis not present

## 2022-09-16 DIAGNOSIS — N184 Chronic kidney disease, stage 4 (severe): Secondary | ICD-10-CM | POA: Diagnosis not present

## 2022-09-16 DIAGNOSIS — J069 Acute upper respiratory infection, unspecified: Secondary | ICD-10-CM | POA: Diagnosis not present

## 2022-09-16 DIAGNOSIS — I4891 Unspecified atrial fibrillation: Secondary | ICD-10-CM | POA: Diagnosis not present

## 2022-09-16 DIAGNOSIS — Z299 Encounter for prophylactic measures, unspecified: Secondary | ICD-10-CM | POA: Diagnosis not present

## 2022-09-22 DIAGNOSIS — N184 Chronic kidney disease, stage 4 (severe): Secondary | ICD-10-CM | POA: Diagnosis not present

## 2022-09-22 DIAGNOSIS — J069 Acute upper respiratory infection, unspecified: Secondary | ICD-10-CM | POA: Diagnosis not present

## 2022-09-22 DIAGNOSIS — D6869 Other thrombophilia: Secondary | ICD-10-CM | POA: Diagnosis not present

## 2022-09-22 DIAGNOSIS — Z299 Encounter for prophylactic measures, unspecified: Secondary | ICD-10-CM | POA: Diagnosis not present

## 2022-09-22 DIAGNOSIS — I700 Atherosclerosis of aorta: Secondary | ICD-10-CM | POA: Diagnosis not present

## 2022-09-22 DIAGNOSIS — R531 Weakness: Secondary | ICD-10-CM | POA: Diagnosis not present

## 2022-09-22 DIAGNOSIS — R918 Other nonspecific abnormal finding of lung field: Secondary | ICD-10-CM | POA: Diagnosis not present

## 2022-09-22 DIAGNOSIS — R059 Cough, unspecified: Secondary | ICD-10-CM | POA: Diagnosis not present

## 2022-10-03 ENCOUNTER — Other Ambulatory Visit: Payer: Self-pay

## 2022-10-03 DIAGNOSIS — D649 Anemia, unspecified: Secondary | ICD-10-CM

## 2022-10-06 ENCOUNTER — Inpatient Hospital Stay: Payer: Medicare Other | Attending: Physician Assistant

## 2022-10-06 DIAGNOSIS — D631 Anemia in chronic kidney disease: Secondary | ICD-10-CM | POA: Insufficient documentation

## 2022-10-06 DIAGNOSIS — Z955 Presence of coronary angioplasty implant and graft: Secondary | ICD-10-CM | POA: Insufficient documentation

## 2022-10-06 DIAGNOSIS — Z79899 Other long term (current) drug therapy: Secondary | ICD-10-CM | POA: Insufficient documentation

## 2022-10-06 DIAGNOSIS — I5032 Chronic diastolic (congestive) heart failure: Secondary | ICD-10-CM | POA: Insufficient documentation

## 2022-10-06 DIAGNOSIS — I4891 Unspecified atrial fibrillation: Secondary | ICD-10-CM | POA: Insufficient documentation

## 2022-10-06 DIAGNOSIS — R5383 Other fatigue: Secondary | ICD-10-CM | POA: Diagnosis not present

## 2022-10-06 DIAGNOSIS — R0609 Other forms of dyspnea: Secondary | ICD-10-CM | POA: Insufficient documentation

## 2022-10-06 DIAGNOSIS — D472 Monoclonal gammopathy: Secondary | ICD-10-CM | POA: Diagnosis not present

## 2022-10-06 DIAGNOSIS — N184 Chronic kidney disease, stage 4 (severe): Secondary | ICD-10-CM | POA: Diagnosis not present

## 2022-10-06 DIAGNOSIS — D649 Anemia, unspecified: Secondary | ICD-10-CM

## 2022-10-06 DIAGNOSIS — I252 Old myocardial infarction: Secondary | ICD-10-CM | POA: Diagnosis not present

## 2022-10-06 DIAGNOSIS — Z853 Personal history of malignant neoplasm of breast: Secondary | ICD-10-CM | POA: Diagnosis not present

## 2022-10-06 DIAGNOSIS — Z9011 Acquired absence of right breast and nipple: Secondary | ICD-10-CM | POA: Insufficient documentation

## 2022-10-06 DIAGNOSIS — Z7901 Long term (current) use of anticoagulants: Secondary | ICD-10-CM | POA: Insufficient documentation

## 2022-10-06 LAB — CBC WITH DIFFERENTIAL/PLATELET
Abs Immature Granulocytes: 0.1 10*3/uL — ABNORMAL HIGH (ref 0.00–0.07)
Basophils Absolute: 0.1 10*3/uL (ref 0.0–0.1)
Basophils Relative: 1 %
Eosinophils Absolute: 0.2 10*3/uL (ref 0.0–0.5)
Eosinophils Relative: 3 %
HCT: 29.9 % — ABNORMAL LOW (ref 36.0–46.0)
Hemoglobin: 9.5 g/dL — ABNORMAL LOW (ref 12.0–15.0)
Immature Granulocytes: 1 %
Lymphocytes Relative: 19 %
Lymphs Abs: 1.4 10*3/uL (ref 0.7–4.0)
MCH: 26.8 pg (ref 26.0–34.0)
MCHC: 31.8 g/dL (ref 30.0–36.0)
MCV: 84.5 fL (ref 80.0–100.0)
Monocytes Absolute: 1.2 10*3/uL — ABNORMAL HIGH (ref 0.1–1.0)
Monocytes Relative: 16 %
Neutro Abs: 4.5 10*3/uL (ref 1.7–7.7)
Neutrophils Relative %: 60 %
Platelets: 316 10*3/uL (ref 150–400)
RBC: 3.54 MIL/uL — ABNORMAL LOW (ref 3.87–5.11)
RDW: 15 % (ref 11.5–15.5)
WBC: 7.4 10*3/uL (ref 4.0–10.5)
nRBC: 0 % (ref 0.0–0.2)

## 2022-10-06 LAB — IRON AND TIBC
Iron: 42 ug/dL (ref 28–170)
Saturation Ratios: 11 % (ref 10.4–31.8)
TIBC: 373 ug/dL (ref 250–450)
UIBC: 331 ug/dL

## 2022-10-06 LAB — FERRITIN: Ferritin: 28 ng/mL (ref 11–307)

## 2022-10-07 ENCOUNTER — Other Ambulatory Visit: Payer: Self-pay | Admitting: Cardiology

## 2022-10-07 ENCOUNTER — Telehealth: Payer: Self-pay | Admitting: Cardiology

## 2022-10-07 DIAGNOSIS — I480 Paroxysmal atrial fibrillation: Secondary | ICD-10-CM

## 2022-10-07 MED ORDER — AMIODARONE HCL 200 MG PO TABS: 100.0000 mg | ORAL_TABLET | Freq: Every day | ORAL | 1 refills | Status: AC

## 2022-10-07 NOTE — Telephone Encounter (Signed)
Prescription refill request for Eliquis received. Indication: PAF Last office visit: 07/29/22  Ival Bible MD Scr: 2.92 on 09/03/22  Epic Age: 88 Weight: 58.5  Based on above findings Eliquis 2.5mg  twice daily is the appropriate dose.  Refill approved.

## 2022-10-07 NOTE — Telephone Encounter (Signed)
Done

## 2022-10-07 NOTE — Telephone Encounter (Signed)
*  STAT* If patient is at the pharmacy, call can be transferred to refill team.   1. Which medications need to be refilled? (please list name of each medication and dose if known)  amiodarone (PACERONE) 200 MG tablet    2. Would you like to learn more about the convenience, safety, & potential cost savings by using the [facility]?No   3. Are you open to using the [facility] No   4. Which pharmacy/location (including street and city if local pharmacy) is medication to be sent to? [facility] - [place], [place] - [address]     5. Do they need a 30 day or 90 day supply?90 Day Supply

## 2022-10-12 NOTE — Progress Notes (Unsigned)
[facility] [address] [place] [address]   CLINIC:  Medical Oncology/Hematology  PCP:  Ignatius Specking, MD [address] [place] [address] [phone]   REASON FOR VISIT:  Follow-up for MGUS and anemia  INTERVAL HISTORY:   Crystal [name] 88 y.o. female returns for routine follow-up of her MGUS and anemia.  She was last seen via telemedicine visit by Rojelio Brenner PA-C on 05/16/2020, and returns today to reestablish care after being referred back at the request of her nephrologist (Dr. Wolfgang Phoenix).  At today's visit, she reports feeling fair.  No recent hospitalizations, surgeries, or changes in baseline health status.  She continues to take Eliquis for atrial fibrillation, and reports that she has soft black bowel movements "most of the time."  She reports being told that this was a "side effect from the Eliquis."  She is symptomatic with fatigue and tires very easily.  She reports feeling cold and having dyspnea on exertion.  She denies any pica, chest pain, lightheadedness, or syncope.  She denies any new bone pain or recent fractures.  She denies any B symptoms such as fever, chills, night sweats, unintentional weight loss.  She has occasional tinnitus.  She denies any peripheral neuropathy or neurologic deficits.  No new masses or lymphadenopathy.  She has 25% energy and 80% appetite. She endorses that she is maintaining a stable weight.  ASSESSMENT & PLAN:  1.  Normocytic anemia secondary to CKD stage IV and functional iron deficiency  - Multifactorial anemia in the setting of CKD 4, iron deficiency, and history of GI bleeding - IV iron and Epogen previously managed by Dr. Wolfgang Phoenix (receiving Retacrit 3000 units every 2 weeks) - History of GI bleeding and Hemoccult stool positivity.  EGD (September 2021): Erosive gastropathy with multiple erosions and stigmata of recent bleeding in gastric antrum Colonoscopy (May 2019) unremarkable - She takes Eliquis 2.5  mg twice daily due to atrial fibrillation - No improvement on oral iron supplementation - Reports receiving PRBC x 1 at [facility] around 2020 01/2020 - Most recent labs (10/06/2022): Hgb 9.5/MCV 84.5, ferritin 28, iron saturation 11% - Reports melanotic stool with most bowel movements - Symptomatic with fatigue, cold intolerance, and dyspnea on exertion - PLAN: Recommend IV Monoferric x 1 - we will premedicate (IV cetirizine, IV famotidine, Tylenol) due to history of multiple medication allergies - Continue CBC + Retacrit 3,000 units every 2 weeks  - Repeat labs (CBC/D, ferritin, iron/TIBC) and RTC in 6 to 8 weeks  - Additional labs to be checked = B12, MMA, folate - We will refer back to GI (NP Lewie Loron per patient request) due to concern for ongoing GI blood loss  2.  Monoclonal gammopathy of unknown significance - IgG kappa - Diagnosed in April 2022 with M spike 0.4 and immunofixation showing IgG kappa monoclonal protein - Skeletal survey (05/02/2020): Diffuse osteopenia with several subtle small lucencies noted in the skull and questionable subtle lucency noted on the right and left humeri, myeloma cannot be excluded per radiology report - No B symptoms, new onset bone pain, or neurologic deficits - Bone marrow biopsy previously offered due to anemia, elevated creatinine, and possible lucencies on skeletal survey.  This was declined by patient and she was lost to follow-up after visit in April 2022. - PLAN: We will check skeletal survey today.  MGUS/myeloma panel including serum immunofixation and beta-2 microglobulin with labs in 6 weeks.  Check 24-hour urine study.  3.  Other history - Family history: Sister with breast cancer/lung cancer, nephew with non-Hodgkin's lymphoma - Social history: Retired from work in Optometrist.  Lives alone.  Lifelong non-smoker, no alcohol or illicit drugs. - Patient denies history of cancer, but her medical record does note that she  has a history of breast cancer s/p right mastectomy - Other PMH: MI with stents x2 in 2021, NSTEMI in January 2022, CKD 4, chronic diastolic heart failure, atrial fibrillation  PLAN SUMMARY: >> Stool cards x 3 >> 24-hour urine >> Skeletal survey (XRAY) today >> Monoferric x 1 >> CBC + Retacrit every 2 weeks >> Referral entered to GI >> Labs in 6 weeks = CBC/D, CMP, LDH, immunofixation, SPEP, light chains, beta-2-microglobulin, ferritin, iron/TIBC, B12, MMA, folate >> OFFICE visit in 8 weeks     REVIEW OF SYSTEMS:   Review of Systems  Constitutional:  Positive for fatigue. Negative for appetite change, chills, diaphoresis, fever and unexpected weight change.  HENT:   Negative for lump/mass and nosebleeds.   Eyes:  Negative for eye problems.  Respiratory:  Positive for shortness of breath (with exertion). Negative for cough and hemoptysis.   Cardiovascular:  Negative for chest pain, leg swelling and palpitations.  Gastrointestinal:  Negative for abdominal pain, blood in stool, constipation, diarrhea, nausea and vomiting.  Genitourinary:  Negative for hematuria.   Skin: Negative.   Neurological:  Negative for dizziness, headaches and light-headedness.  Hematological:  Bruises/bleeds easily.  Psychiatric/Behavioral:  Positive for sleep disturbance.      PHYSICAL EXAM:  ECOG PERFORMANCE STATUS: 1 - Symptomatic but completely ambulatory  Vitals:   10/13/22 1042  BP: (!) 138/46  Pulse: (!) 57  Resp: 18  Temp: 98.2 F (36.8 C)   Filed Weights   10/13/22 1042  Weight: 129 lb 4.8 oz (58.7 kg)   Physical Exam Constitutional:      Appearance: Normal appearance. She is normal weight.  Cardiovascular:     Rate and Rhythm: Rhythm irregular.     Heart sounds: Normal heart sounds.  Pulmonary:     Breath sounds: Normal breath sounds.  Neurological:     General: No focal deficit present.     Mental Status: Mental status is at baseline.  Psychiatric:        Behavior: Behavior  normal. Behavior is cooperative.     PAST MEDICAL/SURGICAL HISTORY:  Past Medical History:  Diagnosis Date   Anemia    Bell's palsy    Breast cancer (HCC) 1998   Right mastectomy   CAD (coronary artery disease)    a. s/p STEMI in 01/2019 with DES to LAD b. s/p NSTEMI in 12/2019 with DES to RCA c. cath in 01/2020 showing patent stents   CHF (congestive heart failure) (HCC)    a. EF 30-35% in 01/2019 b. 40-45% in 01/2020 c. EF at 65-70% by echo in 05/2020   Chronic kidney disease    Essential hypertension    GERD (gastroesophageal reflux disease)    Gout    History of skin cancer    Squamous cell, left shoulder   Mixed hyperlipidemia    NSTEMI (non-ST elevated myocardial infarction) (HCC)    12/2019   Osteopenia    Paroxysmal atrial fibrillation (HCC)    Thyroid nodule    Type 2 diabetes mellitus (HCC)    Past Surgical History:  Procedure Laterality Date   APPENDECTOMY     BIOPSY  10/13/2019   Procedure: BIOPSY;  Surgeon: Corbin Ade, MD;  [facility];  Service: Endoscopy;;   CATARACT EXTRACTION  2016   COLONOSCOPY  2019   Dr Gabriel Cirri   CORONARY ANGIOGRAPHY N/A 01/16/2020   Procedure: CORONARY ANGIOGRAPHY;  Surgeon: Corky Crafts, MD;  Location: [facility] INVASIVE CV LAB;  Service: Cardiovascular;  Laterality: N/A;   CORONARY STENT INTERVENTION N/A 01/16/2020   Procedure: CORONARY STENT INTERVENTION;  Surgeon: Corky Crafts, MD;  Location: [facility] INVASIVE CV LAB;  Service: Cardiovascular;  Laterality: N/A;   CORONARY ULTRASOUND/IVUS N/A 01/16/2020   Procedure: Intravascular Ultrasound/IVUS;  Surgeon: Corky Crafts, MD;  Location: [facility] INVASIVE CV LAB;  Service: Cardiovascular;  Laterality: N/A;   CORONARY/GRAFT ACUTE MI REVASCULARIZATION N/A 01/30/2019   Procedure: Coronary/Graft Acute MI Revascularization;  Surgeon: Kathleene Hazel, MD;  Location: [facility] INVASIVE CV LAB;  Service: Cardiovascular;  Laterality: N/A;   ESOPHAGOGASTRODUODENOSCOPY  (EGD) WITH PROPOFOL N/A 10/13/2019   Non-obstructing Schatzki ring at GE junction, s/p dilation, erosive gastropathy with stigmata of recent bleeding, normal duodenum. Negative H.pylori.    HYSTEROSCOPY     LAPAROSCOPIC APPENDECTOMY N/A 08/03/2020   Procedure: APPENDECTOMY LAPAROSCOPIC;  Surgeon: Campbell Lerner, MD;  Location: [facility];  Service: General;  Laterality: N/A;   LEFT HEART CATH AND CORONARY ANGIOGRAPHY N/A 01/30/2019   Procedure: LEFT HEART CATH AND CORONARY ANGIOGRAPHY;  Surgeon: Kathleene Hazel, MD;  Location: [facility] INVASIVE CV LAB;  Service: Cardiovascular;  Laterality: N/A;   LEFT HEART CATH AND CORONARY ANGIOGRAPHY N/A 01/16/2020   Procedure: LEFT HEART CATH AND CORONARY ANGIOGRAPHY;  Surgeon: Corky Crafts, MD;  Location: [facility] INVASIVE CV LAB;  Service: Cardiovascular;  Laterality: N/A;   MALONEY DILATION N/A 10/13/2019   Procedure: Elease Hashimoto DILATION;  Surgeon: Corbin Ade, MD;  Location: [facility];  Service: Endoscopy;  Laterality: N/A;   Right mastectomy  1998   [facility]   RIGHT/LEFT HEART CATH AND CORONARY ANGIOGRAPHY N/A 02/13/2020   Procedure: RIGHT/LEFT HEART CATH AND CORONARY ANGIOGRAPHY;  Surgeon: Swaziland, Peter M, MD;  Location: [facility] INVASIVE CV LAB;  Service: Cardiovascular;  Laterality: N/A;    SOCIAL HISTORY:  Social History   Socioeconomic History   Marital status: Divorced    Spouse name: Not on file   Number of children: Not on file   Years of education: Not on file   Highest education level: Not on file  Occupational History   Not on file  Tobacco Use   Smoking status: Never    Passive exposure: Past   Smokeless tobacco: Never  Vaping Use   Vaping status: Never Used  Substance and Sexual Activity   Alcohol use: Never   Drug use: Never   Sexual activity: Not on file  Other Topics Concern   Not on file  Social History Narrative   Not on file   Social Determinants of Health   Financial Resource Strain: Low Risk  (01/28/2022)    Overall Financial Resource Strain (CARDIA)    Difficulty of Paying Living Expenses: Not hard at all  Food Insecurity: No Food Insecurity (01/28/2022)   Hunger Vital Sign    Worried About Running Out of Food in the Last Year: Never true    Ran Out of Food in the Last Year: Never true  Transportation Needs: No Transportation Needs (01/28/2022)   PRAPARE - Administrator, Civil Service (Medical): No    Lack of Transportation (Non-Medical): No  Physical Activity: Insufficiently Active (05/01/2020)   Exercise Vital Sign    Days of Exercise per Week:  1 day    Minutes of Exercise per Session: 20 min  Stress: No Stress Concern Present (05/01/2020)   [employer] of Occupational Health - Occupational Stress Questionnaire    Feeling of Stress : Not at all  Social Connections: Unknown (05/15/2020)   Social Connection and Isolation Panel [NHANES]    Frequency of Communication with Friends and Family: More than three times a week    Frequency of Social Gatherings with Friends and Family: More than three times a week    Attends Religious Services: Not on Marketing executive or Organizations: Not on file    Attends Banker Meetings: Not on file    Marital Status: Divorced  Recent Concern: Social Connections - Moderately Isolated (05/01/2020)   Social Connection and Isolation Panel [NHANES]    Frequency of Communication with Friends and Family: More than three times a week    Frequency of Social Gatherings with Friends and Family: More than three times a week    Attends Religious Services: 1 to 4 times per year    Active Member of [employer] or Organizations: No    Attends Banker Meetings: Never    Marital Status: Divorced  Catering manager Violence: Not At Risk (10/03/2021)   Humiliation, Afraid, Rape, and Kick questionnaire    Fear of Current or Ex-Partner: No    Emotionally Abused: No    Physically Abused: No    Sexually Abused: No    FAMILY HISTORY:   Family History  Problem Relation Age of Onset   Stroke Mother    Heart attack Father    Aneurysm Sister    Heart attack Maternal Uncle    Heart disease Maternal Uncle    Heart attack Paternal Aunt    Heart disease Paternal Aunt    Heart attack Paternal Grandfather    Heart disease Paternal Grandfather    Heart attack Paternal Aunt    Heart disease Paternal Aunt    Heart attack Maternal Uncle    Heart disease Maternal Uncle    Heart attack Maternal Uncle    Heart disease Maternal Uncle    Heart attack Sister    Heart disease Sister    Cancer Sister    Colon cancer Neg Hx    Colon polyps Neg Hx     CURRENT MEDICATIONS:  Outpatient Encounter Medications as of 10/13/2022  Medication Sig   acetaminophen (TYLENOL) 500 MG tablet Take 500 mg by mouth every 6 (six) hours as needed for headache (pain).   amiodarone (PACERONE) 200 MG tablet Take 0.5 tablets (100 mg total) by mouth daily.   amLODipine (NORVASC) 5 MG tablet Take 5 mg by mouth daily.   apixaban (ELIQUIS) 2.5 MG TABS tablet TAKE 1 TABLET BY MOUTH TWICE DAILY   CALCIUM CARB-CHOLECALCIFEROL PO Take 1 tablet by mouth daily.   carvedilol (COREG) 6.25 MG tablet TAKE 1 TABLET BY MOUTH TWICE DAILY   cholecalciferol (VITAMIN D3) 25 MCG (1000 UT) tablet Take 1,000 Units by mouth daily with breakfast.   diazepam (VALIUM) 2 MG tablet Take 2 mg by mouth 2 (two) times daily as needed for anxiety.   docusate sodium (COLACE) 100 MG capsule Take 100 mg by mouth daily as needed for mild constipation.   ferrous sulfate 325 (65 FE) MG tablet Take 325 mg by mouth daily before lunch.   hydrALAZINE (APRESOLINE) 100 MG tablet Take 1 tablet (100 mg total) by mouth in the morning, at noon, and  at bedtime.   isosorbide mononitrate (IMDUR) 120 MG 24 hr tablet TAKE 1 TABLET BY MOUTH AT BEDTIME   loperamide (IMODIUM) 2 MG capsule Take 2 mg by mouth as needed for diarrhea or loose stools.   meclizine (ANTIVERT) 25 MG tablet Take 25 mg by mouth as needed  for dizziness.   Multiple Vitamin (MULTIVITAMIN WITH MINERALS) TABS tablet Take 1 tablet by mouth daily. Centrum Silver for Women   multivitamin-lutein (OCUVITE-LUTEIN) CAPS capsule Take 1 capsule by mouth daily before lunch.    nitroGLYCERIN (NITROSTAT) 0.4 MG SL tablet PLACE 1 TAB UNDER TONGUE EVERY 3 MINUTES AS DIRECTED UP TO 3 TIMES AS NEEDED FOR CHEST PAIN.   Simethicone (GAS-X PO) Take 1 capsule by mouth daily as needed (gas).   sodium chloride (OCEAN) 0.65 % SOLN nasal spray Place 1 spray into both nostrils as needed for congestion.   spironolactone (ALDACTONE) 25 MG tablet Take 25 mg by mouth daily.   torsemide (DEMADEX) 20 MG tablet Take 10-20 mg by mouth daily. Take 20 mg on M-W-F & 10 mg all other days   No facility-administered encounter medications on file as of 10/13/2022.    ALLERGIES:  Allergies  Allergen Reactions   Bactrim [Sulfamethoxazole-Trimethoprim] Nausea And Vomiting   Sulfa Antibiotics Nausea And Vomiting   Clonidine Derivatives Other (See Comments)    FATIGUE   Evista [Raloxifene Hcl] Other (See Comments)    GERD   Fosamax [Alendronate Sodium] Other (See Comments)    INCREASED GERD    Glipizide Other (See Comments)    PALPITATIONS   Lipitor [Atorvastatin Calcium] Other (See Comments)    ACHING   Losartan Other (See Comments)    FATIGUE    Pravastatin Other (See Comments)    ACHING   Azithromycin Rash and Other (See Comments)    FACIAL BURNING     LABORATORY DATA:  I have reviewed the labs as listed.  CBC    Component Value Date/Time   WBC 7.4 10/06/2022 1010   RBC 3.54 (L) 10/06/2022 1010   HGB 9.5 (L) 10/06/2022 1010   HCT 29.9 (L) 10/06/2022 1010   PLT 316 10/06/2022 1010   MCV 84.5 10/06/2022 1010   MCH 26.8 10/06/2022 1010   MCHC 31.8 10/06/2022 1010   RDW 15.0 10/06/2022 1010   LYMPHSABS 1.4 10/06/2022 1010   MONOABS 1.2 (H) 10/06/2022 1010   EOSABS 0.2 10/06/2022 1010   BASOSABS 0.1 10/06/2022 1010      Latest Ref Rng & Units  09/03/2022   11:39 AM 07/14/2022   11:07 AM 05/22/2022   11:02 AM  CMP  Glucose 70 - 99 mg/dL 97  161  096   BUN 8 - 23 mg/dL 67  77  68   Creatinine 0.44 - 1.00 mg/dL 0.45  4.09  8.11   Sodium 135 - 145 mmol/L 132  133  134   Potassium 3.5 - 5.1 mmol/L 4.5  4.7  4.1   Chloride 98 - 111 mmol/L 99  99  101   CO2 22 - 32 mmol/L 22  22  23    Calcium 8.9 - 10.3 mg/dL 8.7 - 91.4 mg/dL 8.9    9.3  9.0  9.2     DIAGNOSTIC IMAGING:  I have independently reviewed the relevant imaging and discussed with the patient.   WRAP UP:  All questions were answered. The patient knows to call the clinic with any problems, questions or concerns.  Medical decision making: Moderate  Time spent on visit:  I spent 20 minutes counseling the patient face to face. The total time spent in the appointment was 30 minutes and more than 50% was on counseling.  Carnella Guadalajara, PA-C  10/13/22 12:12 PM

## 2022-10-13 ENCOUNTER — Inpatient Hospital Stay: Payer: Medicare Other

## 2022-10-13 ENCOUNTER — Other Ambulatory Visit: Payer: Medicare Other

## 2022-10-13 ENCOUNTER — Ambulatory Visit (HOSPITAL_COMMUNITY)
Admission: RE | Admit: 2022-10-13 | Discharge: 2022-10-13 | Disposition: A | Discharge status: Home / Self Care | Payer: Medicare Other | Source: Ambulatory Visit | Attending: Physician Assistant | Admitting: Physician Assistant

## 2022-10-13 ENCOUNTER — Inpatient Hospital Stay (HOSPITAL_BASED_OUTPATIENT_CLINIC_OR_DEPARTMENT_OTHER): Payer: Medicare Other | Admitting: Physician Assistant

## 2022-10-13 VITALS — BP 138/46 | HR 57 | Temp 98.2°F | Resp 18 | Wt 129.3 lb

## 2022-10-13 DIAGNOSIS — D472 Monoclonal gammopathy: Secondary | ICD-10-CM | POA: Insufficient documentation

## 2022-10-13 DIAGNOSIS — R0609 Other forms of dyspnea: Secondary | ICD-10-CM | POA: Diagnosis not present

## 2022-10-13 DIAGNOSIS — M47812 Spondylosis without myelopathy or radiculopathy, cervical region: Secondary | ICD-10-CM | POA: Diagnosis not present

## 2022-10-13 DIAGNOSIS — I700 Atherosclerosis of aorta: Secondary | ICD-10-CM | POA: Diagnosis not present

## 2022-10-13 DIAGNOSIS — Z79899 Other long term (current) drug therapy: Secondary | ICD-10-CM | POA: Diagnosis not present

## 2022-10-13 DIAGNOSIS — D500 Iron deficiency anemia secondary to blood loss (chronic): Secondary | ICD-10-CM

## 2022-10-13 DIAGNOSIS — N184 Chronic kidney disease, stage 4 (severe): Secondary | ICD-10-CM

## 2022-10-13 DIAGNOSIS — D631 Anemia in chronic kidney disease: Secondary | ICD-10-CM | POA: Diagnosis not present

## 2022-10-13 DIAGNOSIS — R5383 Other fatigue: Secondary | ICD-10-CM | POA: Diagnosis not present

## 2022-10-13 MED ORDER — EPOETIN ALFA-EPBX 3000 UNIT/ML IJ SOLN
3000.0000 [IU] | Freq: Once | INTRAMUSCULAR | Status: AC
  Administered 2022-10-13: 3000 [IU] via SUBCUTANEOUS
  Filled 2022-10-13: qty 1

## 2022-10-13 NOTE — Progress Notes (Signed)
Message received from R. Pennington PA to give Retacrit 3,000 units injection. HGB 9.5.   Retacrit 3,000 units given today. No complaints at this time. Discharged from clinic ambulatory in stable condition. Alert and oriented x 3. F/U with [facility] as scheduled.

## 2022-10-13 NOTE — Patient Instructions (Signed)
[facility] at [facility] **VISIT SUMMARY & IMPORTANT INSTRUCTIONS **   You were seen today by Rojelio Brenner PA-C for your follow-up visit.    ANEMIA: Your anemia (low hemoglobin/low red blood cells) is caused by two separate issues. Anemia from iron deficiency: You most likely have some ongoing bleeding in your stomach or intestines related to your Eliquis blood thinner. We will schedule you for IV iron x 1 dose. We will check stool cards x 3 to see if you have any blood in your bowel movements. We will refer you to see GI specialist (gastroenterologist) for further evaluation of possible stomach/intestinal bleeding. Anemia from chronic kidney disease: We will check your blood and give Retacrit injection every 2 weeks  MGUS ("Monoclonal Gammopathy of Undetermined Significance"): As we discussed, this is a "precancerous" condition where you have increased plasma cells making an increased amount of abnormal immunoglobulin proteins.  MGUS can progress to a type of cancer called multiple myeloma in some patients.  We will need to check the following tests to make sure you do not have any signs of myeloma cancer in your body.   Whole-body x-rays (stop at radiology department TODAY) Blood work in 6 weeks We will also check a 24-hour urine study.  Kit has been provided for you. FIRST MORNING: Discard first urine of the morning into the toilet. Collect the rest of your urine in the orange jug for the next 24 hours. SECOND MORNING: Collect your first urine of the morning in the jug.  This ends your 24-hour urine collection. **Store the urine jug in the refrigerator but is not being used. Return urine jug to fourth floor front desk as soon as it is completed.   FOLLOW-UP APPOINTMENT: Office visit in 2 months  ** Thank you for trusting me with your healthcare!  I strive to provide all of my patients with quality care at each visit.  If you receive a survey for this visit, I  would be so grateful to you for taking the time to provide feedback.  Thank you in advance!  ~ Fran Mcree                   Dr. Doreatha Massed   &   Rojelio Brenner, PA-C   - - - - - - - - - - - - - - - - - -    Thank you for choosing [facility] at [facility] to provide your oncology and hematology care.  To afford each patient quality time with our provider, please arrive at least 15 minutes before your scheduled appointment time.   If you have a lab appointment with the Cancer Center please come in thru the Main Entrance and check in at the main information desk.  You need to re-schedule your appointment should you arrive 10 or more minutes late.  We strive to give you quality time with our providers, and arriving late affects you and other patients whose appointments are after yours.  Also, if you no show three or more times for appointments you may be dismissed from the clinic at the providers discretion.     Again, thank you for choosing [facility].  Our hope is that these requests will decrease the amount of time that you wait before being seen by our physicians.       _____________________________________________________________  Should you have questions after your visit to [facility], please contact  our office at [phone] and follow the prompts.  Our office hours are 8:00 a.m. and 4:30 p.m. Monday - Friday.  Please note that voicemails left after 4:00 p.m. may not be returned until the following business day.  We are closed weekends and major holidays.  You do have access to a nurse 24-7, just call the main number to the clinic [phone] and do not press any options, hold on the line and a nurse will answer the phone.    For prescription refill requests, have your pharmacy contact our office and allow 72 hours.

## 2022-10-16 ENCOUNTER — Inpatient Hospital Stay: Payer: Medicare Other

## 2022-10-16 ENCOUNTER — Other Ambulatory Visit: Payer: Self-pay

## 2022-10-16 VITALS — BP 166/42 | HR 67 | Temp 97.3°F | Resp 18

## 2022-10-16 DIAGNOSIS — N184 Chronic kidney disease, stage 4 (severe): Secondary | ICD-10-CM | POA: Diagnosis not present

## 2022-10-16 DIAGNOSIS — D631 Anemia in chronic kidney disease: Secondary | ICD-10-CM | POA: Diagnosis not present

## 2022-10-16 DIAGNOSIS — Z79899 Other long term (current) drug therapy: Secondary | ICD-10-CM | POA: Diagnosis not present

## 2022-10-16 DIAGNOSIS — R0609 Other forms of dyspnea: Secondary | ICD-10-CM | POA: Diagnosis not present

## 2022-10-16 DIAGNOSIS — R5383 Other fatigue: Secondary | ICD-10-CM | POA: Diagnosis not present

## 2022-10-16 DIAGNOSIS — D472 Monoclonal gammopathy: Secondary | ICD-10-CM | POA: Diagnosis not present

## 2022-10-16 MED ORDER — CETIRIZINE HCL 10 MG/ML IV SOLN
10.0000 mg | Freq: Once | INTRAVENOUS | Status: AC
  Administered 2022-10-16: 10 mg via INTRAVENOUS
  Filled 2022-10-16: qty 1

## 2022-10-16 MED ORDER — SODIUM CHLORIDE 0.9 % IV SOLN
1000.0000 mg | Freq: Once | INTRAVENOUS | Status: AC
  Administered 2022-10-16: 1000 mg via INTRAVENOUS
  Filled 2022-10-16: qty 1000

## 2022-10-16 MED ORDER — SODIUM CHLORIDE 0.9 % IV SOLN: Freq: Once | INTRAVENOUS | Status: AC

## 2022-10-16 MED ORDER — SODIUM CHLORIDE 0.9 % IV SOLN
10.0000 mg | Freq: Once | INTRAVENOUS | Status: DC
  Filled 2022-10-16: qty 1

## 2022-10-16 MED ORDER — ACETAMINOPHEN 325 MG PO TABS
650.0000 mg | ORAL_TABLET | Freq: Once | ORAL | Status: AC
  Administered 2022-10-16: 650 mg via ORAL
  Filled 2022-10-16: qty 2

## 2022-10-16 MED ORDER — SODIUM CHLORIDE 0.9 % IV SOLN
10.0000 mg | Freq: Once | INTRAVENOUS | Status: AC
  Administered 2022-10-16: 10 mg via INTRAVENOUS
  Filled 2022-10-16: qty 1

## 2022-10-16 NOTE — Progress Notes (Signed)
Patient presents today for iron infusion.  Patient is in satisfactory condition with no new complaints voiced.  Vital signs are stable.  IV placed in L arm.  IV flushed well with good blood return noted.  We will proceed with infusion per provider orders.

## 2022-10-16 NOTE — Patient Instructions (Signed)
[facility]  Discharge Instructions: Thank you for choosing [facility] to provide your oncology and hematology care.  If you have a lab appointment with the Cancer Center - please note that after April 8th, 2024, all labs will be drawn in the cancer center.  You do not have to check in or register with the main entrance as you have in the past but will complete your check-in in the cancer center.  Wear comfortable clothing and clothing appropriate for easy access to any Portacath or PICC line.   We strive to give you quality time with your provider. You may need to reschedule your appointment if you arrive late (15 or more minutes).  Arriving late affects you and other patients whose appointments are after yours.  Also, if you miss three or more appointments without notifying the office, you may be dismissed from the clinic at the provider's discretion.      For prescription refill requests, have your pharmacy contact our office and allow 72 hours for refills to be completed.    Today you received the following:  Feraheme.  Ferumoxytol Injection What is this medication? FERUMOXYTOL (FER ue MOX i tol) treats low levels of iron in your body (iron deficiency anemia). Iron is a mineral that plays an important role in making red blood cells, which carry oxygen from your lungs to the rest of your body. This medicine may be used for other purposes; ask your health care provider or pharmacist if you have questions. COMMON BRAND NAME(S): Feraheme What should I tell my care team before I take this medication? They need to know if you have any of these conditions: Anemia not caused by low iron levels High levels of iron in the blood Magnetic resonance imaging (MRI) test scheduled An unusual or allergic reaction to iron, other medications, foods, dyes, or preservatives Pregnant or trying to get pregnant Breastfeeding How should I use this medication? This medication  is injected into a vein. It is given by your care team in a hospital or clinic setting. Talk to your care team the use of this medication in children. Special care may be needed. Overdosage: If you think you have taken too much of this medicine contact a poison control center or emergency room at once. NOTE: This medicine is only for you. Do not share this medicine with others. What if I miss a dose? It is important not to miss your dose. Call your care team if you are unable to keep an appointment. What may interact with this medication? Other iron products This list may not describe all possible interactions. Give your health care provider a list of all the medicines, herbs, non-prescription drugs, or dietary supplements you use. Also tell them if you smoke, drink alcohol, or use illegal drugs. Some items may interact with your medicine. What should I watch for while using this medication? Visit your care team regularly. Tell your care team if your symptoms do not start to get better or if they get worse. You may need blood work done while you are taking this medication. You may need to follow a special diet. Talk to your care team. Foods that contain iron include: whole grains/cereals, dried fruits, beans, or peas, leafy green vegetables, and organ meats (liver, kidney). What side effects may I notice from receiving this medication? Side effects that you should report to your care team as soon as possible: Allergic reactions--skin rash, itching, hives, swelling of the  face, lips, tongue, or throat Low blood pressure--dizziness, feeling faint or lightheaded, blurry vision Shortness of breath Side effects that usually do not require medical attention (report to your care team if they continue or are bothersome): Flushing Headache Joint pain Muscle pain Nausea Pain, redness, or irritation at injection site This list may not describe all possible side effects. Call your doctor for medical  advice about side effects. You may report side effects to FDA at 1-800-FDA-1088. Where should I keep my medication? This medication is given in a hospital or clinic. It will not be stored at home. NOTE: This sheet is a summary. It may not cover all possible information. If you have questions about this medicine, talk to your doctor, pharmacist, or health care provider.  2024 Elsevier/Gold Standard (2022-06-20 00:00:00)     To help prevent nausea and vomiting after your treatment, we encourage you to take your nausea medication as directed.  BELOW ARE SYMPTOMS THAT SHOULD BE REPORTED IMMEDIATELY: *FEVER GREATER THAN 100.4 F (38 C) OR HIGHER *CHILLS OR SWEATING *NAUSEA AND VOMITING THAT IS NOT CONTROLLED WITH YOUR NAUSEA MEDICATION *UNUSUAL SHORTNESS OF BREATH *UNUSUAL BRUISING OR BLEEDING *URINARY PROBLEMS (pain or burning when urinating, or frequent urination) *BOWEL PROBLEMS (unusual diarrhea, constipation, pain near the anus) TENDERNESS IN MOUTH AND THROAT WITH OR WITHOUT PRESENCE OF ULCERS (sore throat, sores in mouth, or a toothache) UNUSUAL RASH, SWELLING OR PAIN  UNUSUAL VAGINAL DISCHARGE OR ITCHING   Items with * indicate a potential emergency and should be followed up as soon as possible or go to the Emergency Department if any problems should occur.  Please show the CHEMOTHERAPY ALERT CARD or IMMUNOTHERAPY ALERT CARD at check-in to the Emergency Department and triage nurse.  Should you have questions after your visit or need to cancel or reschedule your appointment, please contact [facility] [phone]  and follow the prompts.  Office hours are 8:00 a.m. to 4:30 p.m. Monday - Friday. Please note that voicemails left after 4:00 p.m. may not be returned until the following business day.  We are closed weekends and major holidays. You have access to a nurse at all times for urgent questions. Please call the main number to the clinic [phone] and follow  the prompts.  For any non-urgent questions, you may also contact your provider using MyChart. We now offer e-Visits for anyone 67 and older to request care online for non-urgent symptoms. For details visit [email or web address].   Also download the MyChart app! Go to the app store, search "MyChart", open the app, select [facility], and log in with your MyChart username and password.

## 2022-10-20 DIAGNOSIS — Z299 Encounter for prophylactic measures, unspecified: Secondary | ICD-10-CM | POA: Diagnosis not present

## 2022-10-20 DIAGNOSIS — M25562 Pain in left knee: Secondary | ICD-10-CM | POA: Diagnosis not present

## 2022-10-20 DIAGNOSIS — I10 Essential (primary) hypertension: Secondary | ICD-10-CM | POA: Diagnosis not present

## 2022-10-21 ENCOUNTER — Ambulatory Visit: Payer: Medicare Other | Admitting: Gastroenterology

## 2022-10-21 LAB — UPEP/UIFE/LIGHT CHAINS/TP, 24-HR UR
% BETA, Urine: 35.8 %
ALPHA 1 URINE: 9.7 %
Albumin, U: 18.7 %
Alpha 2, Urine: 16.6 %
Free Kappa Lt Chains,Ur: 12.05 mg/L (ref 1.17–86.46)
Free Kappa/Lambda Ratio: 5.13 (ref 1.83–14.26)
Free Lambda Lt Chains,Ur: 2.35 mg/L (ref 0.27–15.21)
GAMMA GLOBULIN URINE: 19.1 %
Total Protein, Urine-Ur/day: 156 mg/24 hr — ABNORMAL HIGH (ref 30–150)
Total Protein, Urine: 16.9 mg/dL
Total Volume: 925

## 2022-10-22 DIAGNOSIS — I4891 Unspecified atrial fibrillation: Secondary | ICD-10-CM | POA: Diagnosis not present

## 2022-10-22 DIAGNOSIS — N179 Acute kidney failure, unspecified: Secondary | ICD-10-CM | POA: Diagnosis not present

## 2022-10-22 DIAGNOSIS — D509 Iron deficiency anemia, unspecified: Secondary | ICD-10-CM | POA: Diagnosis present

## 2022-10-22 DIAGNOSIS — Z7901 Long term (current) use of anticoagulants: Secondary | ICD-10-CM | POA: Diagnosis not present

## 2022-10-22 DIAGNOSIS — J90 Pleural effusion, not elsewhere classified: Secondary | ICD-10-CM | POA: Diagnosis not present

## 2022-10-22 DIAGNOSIS — K5730 Diverticulosis of large intestine without perforation or abscess without bleeding: Secondary | ICD-10-CM | POA: Diagnosis not present

## 2022-10-22 DIAGNOSIS — Z723 Lack of physical exercise: Secondary | ICD-10-CM | POA: Diagnosis not present

## 2022-10-22 DIAGNOSIS — R0602 Shortness of breath: Secondary | ICD-10-CM | POA: Diagnosis not present

## 2022-10-22 DIAGNOSIS — Z66 Do not resuscitate: Secondary | ICD-10-CM | POA: Diagnosis present

## 2022-10-22 DIAGNOSIS — Z888 Allergy status to other drugs, medicaments and biological substances status: Secondary | ICD-10-CM | POA: Diagnosis not present

## 2022-10-22 DIAGNOSIS — I480 Paroxysmal atrial fibrillation: Secondary | ICD-10-CM | POA: Diagnosis not present

## 2022-10-22 DIAGNOSIS — N184 Chronic kidney disease, stage 4 (severe): Secondary | ICD-10-CM | POA: Diagnosis present

## 2022-10-22 DIAGNOSIS — Z79899 Other long term (current) drug therapy: Secondary | ICD-10-CM | POA: Diagnosis not present

## 2022-10-22 DIAGNOSIS — E871 Hypo-osmolality and hyponatremia: Secondary | ICD-10-CM | POA: Diagnosis present

## 2022-10-22 DIAGNOSIS — K449 Diaphragmatic hernia without obstruction or gangrene: Secondary | ICD-10-CM | POA: Diagnosis not present

## 2022-10-22 DIAGNOSIS — R54 Age-related physical debility: Secondary | ICD-10-CM | POA: Diagnosis present

## 2022-10-22 DIAGNOSIS — R0902 Hypoxemia: Secondary | ICD-10-CM | POA: Diagnosis not present

## 2022-10-22 DIAGNOSIS — I130 Hypertensive heart and chronic kidney disease with heart failure and stage 1 through stage 4 chronic kidney disease, or unspecified chronic kidney disease: Secondary | ICD-10-CM | POA: Diagnosis present

## 2022-10-22 DIAGNOSIS — R0989 Other specified symptoms and signs involving the circulatory and respiratory systems: Secondary | ICD-10-CM | POA: Diagnosis not present

## 2022-10-22 DIAGNOSIS — I129 Hypertensive chronic kidney disease with stage 1 through stage 4 chronic kidney disease, or unspecified chronic kidney disease: Secondary | ICD-10-CM | POA: Diagnosis not present

## 2022-10-22 DIAGNOSIS — D631 Anemia in chronic kidney disease: Secondary | ICD-10-CM | POA: Diagnosis present

## 2022-10-22 DIAGNOSIS — K529 Noninfective gastroenteritis and colitis, unspecified: Secondary | ICD-10-CM | POA: Diagnosis present

## 2022-10-22 DIAGNOSIS — R918 Other nonspecific abnormal finding of lung field: Secondary | ICD-10-CM | POA: Diagnosis not present

## 2022-10-22 DIAGNOSIS — Z882 Allergy status to sulfonamides status: Secondary | ICD-10-CM | POA: Diagnosis not present

## 2022-10-22 DIAGNOSIS — R112 Nausea with vomiting, unspecified: Secondary | ICD-10-CM | POA: Diagnosis not present

## 2022-10-22 DIAGNOSIS — I5032 Chronic diastolic (congestive) heart failure: Secondary | ICD-10-CM | POA: Diagnosis present

## 2022-10-22 DIAGNOSIS — K8020 Calculus of gallbladder without cholecystitis without obstruction: Secondary | ICD-10-CM | POA: Diagnosis not present

## 2022-10-22 DIAGNOSIS — D649 Anemia, unspecified: Secondary | ICD-10-CM | POA: Diagnosis not present

## 2022-10-22 DIAGNOSIS — I2510 Atherosclerotic heart disease of native coronary artery without angina pectoris: Secondary | ICD-10-CM | POA: Diagnosis not present

## 2022-10-22 DIAGNOSIS — E860 Dehydration: Secondary | ICD-10-CM | POA: Diagnosis not present

## 2022-10-22 DIAGNOSIS — Z1152 Encounter for screening for COVID-19: Secondary | ICD-10-CM | POA: Diagnosis not present

## 2022-10-22 DIAGNOSIS — R509 Fever, unspecified: Secondary | ICD-10-CM | POA: Diagnosis not present

## 2022-10-22 DIAGNOSIS — E1122 Type 2 diabetes mellitus with diabetic chronic kidney disease: Secondary | ICD-10-CM | POA: Diagnosis present

## 2022-10-22 DIAGNOSIS — Z7982 Long term (current) use of aspirin: Secondary | ICD-10-CM | POA: Diagnosis not present

## 2022-10-23 DIAGNOSIS — R0902 Hypoxemia: Secondary | ICD-10-CM | POA: Diagnosis present

## 2022-10-23 DIAGNOSIS — J90 Pleural effusion, not elsewhere classified: Secondary | ICD-10-CM | POA: Diagnosis not present

## 2022-10-23 DIAGNOSIS — Z7982 Long term (current) use of aspirin: Secondary | ICD-10-CM | POA: Diagnosis not present

## 2022-10-23 DIAGNOSIS — D649 Anemia, unspecified: Secondary | ICD-10-CM | POA: Diagnosis not present

## 2022-10-23 DIAGNOSIS — Z882 Allergy status to sulfonamides status: Secondary | ICD-10-CM | POA: Diagnosis not present

## 2022-10-23 DIAGNOSIS — Z7901 Long term (current) use of anticoagulants: Secondary | ICD-10-CM | POA: Diagnosis not present

## 2022-10-23 DIAGNOSIS — Z723 Lack of physical exercise: Secondary | ICD-10-CM | POA: Diagnosis not present

## 2022-10-23 DIAGNOSIS — E871 Hypo-osmolality and hyponatremia: Secondary | ICD-10-CM | POA: Diagnosis present

## 2022-10-23 DIAGNOSIS — Z888 Allergy status to other drugs, medicaments and biological substances status: Secondary | ICD-10-CM | POA: Diagnosis not present

## 2022-10-23 DIAGNOSIS — I480 Paroxysmal atrial fibrillation: Secondary | ICD-10-CM | POA: Diagnosis present

## 2022-10-23 DIAGNOSIS — I129 Hypertensive chronic kidney disease with stage 1 through stage 4 chronic kidney disease, or unspecified chronic kidney disease: Secondary | ICD-10-CM | POA: Diagnosis not present

## 2022-10-23 DIAGNOSIS — K529 Noninfective gastroenteritis and colitis, unspecified: Secondary | ICD-10-CM | POA: Diagnosis present

## 2022-10-23 DIAGNOSIS — R509 Fever, unspecified: Secondary | ICD-10-CM | POA: Diagnosis not present

## 2022-10-23 DIAGNOSIS — R54 Age-related physical debility: Secondary | ICD-10-CM | POA: Diagnosis present

## 2022-10-23 DIAGNOSIS — Z1152 Encounter for screening for COVID-19: Secondary | ICD-10-CM | POA: Diagnosis not present

## 2022-10-23 DIAGNOSIS — Z79899 Other long term (current) drug therapy: Secondary | ICD-10-CM | POA: Diagnosis not present

## 2022-10-23 DIAGNOSIS — I2510 Atherosclerotic heart disease of native coronary artery without angina pectoris: Secondary | ICD-10-CM | POA: Diagnosis present

## 2022-10-23 DIAGNOSIS — R918 Other nonspecific abnormal finding of lung field: Secondary | ICD-10-CM | POA: Diagnosis not present

## 2022-10-23 DIAGNOSIS — I4891 Unspecified atrial fibrillation: Secondary | ICD-10-CM | POA: Diagnosis not present

## 2022-10-23 DIAGNOSIS — N179 Acute kidney failure, unspecified: Secondary | ICD-10-CM | POA: Diagnosis present

## 2022-10-23 DIAGNOSIS — K449 Diaphragmatic hernia without obstruction or gangrene: Secondary | ICD-10-CM | POA: Diagnosis not present

## 2022-10-23 DIAGNOSIS — K8020 Calculus of gallbladder without cholecystitis without obstruction: Secondary | ICD-10-CM | POA: Diagnosis not present

## 2022-10-23 DIAGNOSIS — I130 Hypertensive heart and chronic kidney disease with heart failure and stage 1 through stage 4 chronic kidney disease, or unspecified chronic kidney disease: Secondary | ICD-10-CM | POA: Diagnosis present

## 2022-10-23 DIAGNOSIS — K5730 Diverticulosis of large intestine without perforation or abscess without bleeding: Secondary | ICD-10-CM | POA: Diagnosis not present

## 2022-10-23 DIAGNOSIS — I5032 Chronic diastolic (congestive) heart failure: Secondary | ICD-10-CM | POA: Diagnosis present

## 2022-10-23 DIAGNOSIS — R0989 Other specified symptoms and signs involving the circulatory and respiratory systems: Secondary | ICD-10-CM | POA: Diagnosis not present

## 2022-10-23 DIAGNOSIS — R0602 Shortness of breath: Secondary | ICD-10-CM | POA: Diagnosis not present

## 2022-10-23 DIAGNOSIS — E1122 Type 2 diabetes mellitus with diabetic chronic kidney disease: Secondary | ICD-10-CM | POA: Diagnosis present

## 2022-10-23 DIAGNOSIS — D631 Anemia in chronic kidney disease: Secondary | ICD-10-CM | POA: Diagnosis present

## 2022-10-23 DIAGNOSIS — Z66 Do not resuscitate: Secondary | ICD-10-CM | POA: Diagnosis present

## 2022-10-23 DIAGNOSIS — N184 Chronic kidney disease, stage 4 (severe): Secondary | ICD-10-CM | POA: Diagnosis present

## 2022-10-23 DIAGNOSIS — E860 Dehydration: Secondary | ICD-10-CM | POA: Diagnosis present

## 2022-10-23 DIAGNOSIS — D509 Iron deficiency anemia, unspecified: Secondary | ICD-10-CM | POA: Diagnosis present

## 2022-10-27 ENCOUNTER — Inpatient Hospital Stay: Payer: Medicare Other

## 2022-11-03 DIAGNOSIS — I5022 Chronic systolic (congestive) heart failure: Secondary | ICD-10-CM | POA: Diagnosis not present

## 2022-11-03 DIAGNOSIS — N184 Chronic kidney disease, stage 4 (severe): Secondary | ICD-10-CM | POA: Diagnosis not present

## 2022-11-03 DIAGNOSIS — E43 Unspecified severe protein-calorie malnutrition: Secondary | ICD-10-CM | POA: Diagnosis not present

## 2022-11-03 DIAGNOSIS — I4891 Unspecified atrial fibrillation: Secondary | ICD-10-CM | POA: Diagnosis not present

## 2022-11-03 DIAGNOSIS — Z09 Encounter for follow-up examination after completed treatment for conditions other than malignant neoplasm: Secondary | ICD-10-CM | POA: Diagnosis not present

## 2022-11-10 ENCOUNTER — Inpatient Hospital Stay: Payer: Medicare Other

## 2022-11-10 DIAGNOSIS — I5022 Chronic systolic (congestive) heart failure: Secondary | ICD-10-CM | POA: Diagnosis not present

## 2022-11-10 DIAGNOSIS — D692 Other nonthrombocytopenic purpura: Secondary | ICD-10-CM | POA: Diagnosis not present

## 2022-11-10 DIAGNOSIS — N184 Chronic kidney disease, stage 4 (severe): Secondary | ICD-10-CM | POA: Diagnosis not present

## 2022-11-10 DIAGNOSIS — I700 Atherosclerosis of aorta: Secondary | ICD-10-CM | POA: Diagnosis not present

## 2022-11-11 DIAGNOSIS — Z8679 Personal history of other diseases of the circulatory system: Secondary | ICD-10-CM | POA: Diagnosis not present

## 2022-11-11 DIAGNOSIS — R54 Age-related physical debility: Secondary | ICD-10-CM | POA: Diagnosis present

## 2022-11-11 DIAGNOSIS — J9811 Atelectasis: Secondary | ICD-10-CM | POA: Diagnosis not present

## 2022-11-11 DIAGNOSIS — M62562 Muscle wasting and atrophy, not elsewhere classified, left lower leg: Secondary | ICD-10-CM | POA: Diagnosis not present

## 2022-11-11 DIAGNOSIS — E1122 Type 2 diabetes mellitus with diabetic chronic kidney disease: Secondary | ICD-10-CM | POA: Diagnosis present

## 2022-11-11 DIAGNOSIS — Z1152 Encounter for screening for COVID-19: Secondary | ICD-10-CM | POA: Diagnosis not present

## 2022-11-11 DIAGNOSIS — Z7982 Long term (current) use of aspirin: Secondary | ICD-10-CM | POA: Diagnosis not present

## 2022-11-11 DIAGNOSIS — I10 Essential (primary) hypertension: Secondary | ICD-10-CM | POA: Diagnosis not present

## 2022-11-11 DIAGNOSIS — J189 Pneumonia, unspecified organism: Secondary | ICD-10-CM | POA: Diagnosis not present

## 2022-11-11 DIAGNOSIS — D649 Anemia, unspecified: Secondary | ICD-10-CM | POA: Diagnosis not present

## 2022-11-11 DIAGNOSIS — I5032 Chronic diastolic (congestive) heart failure: Secondary | ICD-10-CM | POA: Diagnosis present

## 2022-11-11 DIAGNOSIS — J811 Chronic pulmonary edema: Secondary | ICD-10-CM | POA: Diagnosis not present

## 2022-11-11 DIAGNOSIS — R069 Unspecified abnormalities of breathing: Secondary | ICD-10-CM | POA: Diagnosis not present

## 2022-11-11 DIAGNOSIS — D72829 Elevated white blood cell count, unspecified: Secondary | ICD-10-CM | POA: Diagnosis not present

## 2022-11-11 DIAGNOSIS — Z853 Personal history of malignant neoplasm of breast: Secondary | ICD-10-CM | POA: Diagnosis not present

## 2022-11-11 DIAGNOSIS — E871 Hypo-osmolality and hyponatremia: Secondary | ICD-10-CM | POA: Diagnosis present

## 2022-11-11 DIAGNOSIS — M62561 Muscle wasting and atrophy, not elsewhere classified, right lower leg: Secondary | ICD-10-CM | POA: Diagnosis not present

## 2022-11-11 DIAGNOSIS — E119 Type 2 diabetes mellitus without complications: Secondary | ICD-10-CM | POA: Diagnosis not present

## 2022-11-11 DIAGNOSIS — Z20822 Contact with and (suspected) exposure to covid-19: Secondary | ICD-10-CM | POA: Diagnosis not present

## 2022-11-11 DIAGNOSIS — I480 Paroxysmal atrial fibrillation: Secondary | ICD-10-CM | POA: Diagnosis not present

## 2022-11-11 DIAGNOSIS — Z882 Allergy status to sulfonamides status: Secondary | ICD-10-CM | POA: Diagnosis not present

## 2022-11-11 DIAGNOSIS — N179 Acute kidney failure, unspecified: Secondary | ICD-10-CM | POA: Diagnosis present

## 2022-11-11 DIAGNOSIS — N184 Chronic kidney disease, stage 4 (severe): Secondary | ICD-10-CM | POA: Diagnosis not present

## 2022-11-11 DIAGNOSIS — R0902 Hypoxemia: Secondary | ICD-10-CM | POA: Diagnosis not present

## 2022-11-11 DIAGNOSIS — Z9981 Dependence on supplemental oxygen: Secondary | ICD-10-CM | POA: Diagnosis not present

## 2022-11-11 DIAGNOSIS — J1569 Pneumonia due to other gram-negative bacteria: Secondary | ICD-10-CM | POA: Diagnosis present

## 2022-11-11 DIAGNOSIS — N189 Chronic kidney disease, unspecified: Secondary | ICD-10-CM | POA: Diagnosis not present

## 2022-11-11 DIAGNOSIS — R41841 Cognitive communication deficit: Secondary | ICD-10-CM | POA: Diagnosis not present

## 2022-11-11 DIAGNOSIS — R0602 Shortness of breath: Secondary | ICD-10-CM | POA: Diagnosis not present

## 2022-11-11 DIAGNOSIS — Z66 Do not resuscitate: Secondary | ICD-10-CM | POA: Diagnosis present

## 2022-11-11 DIAGNOSIS — D509 Iron deficiency anemia, unspecified: Secondary | ICD-10-CM | POA: Diagnosis not present

## 2022-11-11 DIAGNOSIS — Z79899 Other long term (current) drug therapy: Secondary | ICD-10-CM | POA: Diagnosis not present

## 2022-11-11 DIAGNOSIS — D631 Anemia in chronic kidney disease: Secondary | ICD-10-CM | POA: Diagnosis present

## 2022-11-11 DIAGNOSIS — J90 Pleural effusion, not elsewhere classified: Secondary | ICD-10-CM | POA: Diagnosis not present

## 2022-11-11 DIAGNOSIS — Z792 Long term (current) use of antibiotics: Secondary | ICD-10-CM | POA: Diagnosis not present

## 2022-11-11 DIAGNOSIS — I129 Hypertensive chronic kidney disease with stage 1 through stage 4 chronic kidney disease, or unspecified chronic kidney disease: Secondary | ICD-10-CM | POA: Diagnosis not present

## 2022-11-11 DIAGNOSIS — I130 Hypertensive heart and chronic kidney disease with heart failure and stage 1 through stage 4 chronic kidney disease, or unspecified chronic kidney disease: Secondary | ICD-10-CM | POA: Diagnosis present

## 2022-11-11 DIAGNOSIS — F419 Anxiety disorder, unspecified: Secondary | ICD-10-CM | POA: Diagnosis not present

## 2022-11-11 DIAGNOSIS — Z7901 Long term (current) use of anticoagulants: Secondary | ICD-10-CM | POA: Diagnosis not present

## 2022-11-11 DIAGNOSIS — I959 Hypotension, unspecified: Secondary | ICD-10-CM | POA: Diagnosis not present

## 2022-11-11 DIAGNOSIS — R059 Cough, unspecified: Secondary | ICD-10-CM | POA: Diagnosis not present

## 2022-11-11 DIAGNOSIS — Z888 Allergy status to other drugs, medicaments and biological substances status: Secondary | ICD-10-CM | POA: Diagnosis not present

## 2022-11-11 DIAGNOSIS — J188 Other pneumonia, unspecified organism: Secondary | ICD-10-CM | POA: Diagnosis not present

## 2022-11-11 DIAGNOSIS — I4891 Unspecified atrial fibrillation: Secondary | ICD-10-CM | POA: Diagnosis not present

## 2022-11-11 DIAGNOSIS — J441 Chronic obstructive pulmonary disease with (acute) exacerbation: Secondary | ICD-10-CM | POA: Diagnosis present

## 2022-11-11 DIAGNOSIS — I252 Old myocardial infarction: Secondary | ICD-10-CM | POA: Diagnosis not present

## 2022-11-11 DIAGNOSIS — R2689 Other abnormalities of gait and mobility: Secondary | ICD-10-CM | POA: Diagnosis not present

## 2022-11-12 DIAGNOSIS — J1569 Pneumonia due to other gram-negative bacteria: Secondary | ICD-10-CM | POA: Diagnosis present

## 2022-11-12 DIAGNOSIS — E1122 Type 2 diabetes mellitus with diabetic chronic kidney disease: Secondary | ICD-10-CM | POA: Diagnosis present

## 2022-11-12 DIAGNOSIS — Z853 Personal history of malignant neoplasm of breast: Secondary | ICD-10-CM | POA: Diagnosis not present

## 2022-11-12 DIAGNOSIS — N179 Acute kidney failure, unspecified: Secondary | ICD-10-CM | POA: Diagnosis present

## 2022-11-12 DIAGNOSIS — D649 Anemia, unspecified: Secondary | ICD-10-CM | POA: Diagnosis not present

## 2022-11-12 DIAGNOSIS — I10 Essential (primary) hypertension: Secondary | ICD-10-CM | POA: Diagnosis not present

## 2022-11-12 DIAGNOSIS — I4891 Unspecified atrial fibrillation: Secondary | ICD-10-CM | POA: Diagnosis not present

## 2022-11-12 DIAGNOSIS — Z8679 Personal history of other diseases of the circulatory system: Secondary | ICD-10-CM | POA: Diagnosis not present

## 2022-11-12 DIAGNOSIS — Z66 Do not resuscitate: Secondary | ICD-10-CM | POA: Diagnosis present

## 2022-11-12 DIAGNOSIS — F419 Anxiety disorder, unspecified: Secondary | ICD-10-CM | POA: Diagnosis not present

## 2022-11-12 DIAGNOSIS — E871 Hypo-osmolality and hyponatremia: Secondary | ICD-10-CM | POA: Diagnosis present

## 2022-11-12 DIAGNOSIS — Z7982 Long term (current) use of aspirin: Secondary | ICD-10-CM | POA: Diagnosis not present

## 2022-11-12 DIAGNOSIS — J189 Pneumonia, unspecified organism: Secondary | ICD-10-CM | POA: Diagnosis not present

## 2022-11-12 DIAGNOSIS — Z882 Allergy status to sulfonamides status: Secondary | ICD-10-CM | POA: Diagnosis not present

## 2022-11-12 DIAGNOSIS — I480 Paroxysmal atrial fibrillation: Secondary | ICD-10-CM | POA: Diagnosis present

## 2022-11-12 DIAGNOSIS — Z792 Long term (current) use of antibiotics: Secondary | ICD-10-CM | POA: Diagnosis not present

## 2022-11-12 DIAGNOSIS — R41841 Cognitive communication deficit: Secondary | ICD-10-CM | POA: Diagnosis not present

## 2022-11-12 DIAGNOSIS — J441 Chronic obstructive pulmonary disease with (acute) exacerbation: Secondary | ICD-10-CM | POA: Diagnosis present

## 2022-11-12 DIAGNOSIS — D509 Iron deficiency anemia, unspecified: Secondary | ICD-10-CM | POA: Diagnosis present

## 2022-11-12 DIAGNOSIS — D631 Anemia in chronic kidney disease: Secondary | ICD-10-CM | POA: Diagnosis present

## 2022-11-12 DIAGNOSIS — I5032 Chronic diastolic (congestive) heart failure: Secondary | ICD-10-CM | POA: Diagnosis present

## 2022-11-12 DIAGNOSIS — M62562 Muscle wasting and atrophy, not elsewhere classified, left lower leg: Secondary | ICD-10-CM | POA: Diagnosis not present

## 2022-11-12 DIAGNOSIS — Z79899 Other long term (current) drug therapy: Secondary | ICD-10-CM | POA: Diagnosis not present

## 2022-11-12 DIAGNOSIS — E119 Type 2 diabetes mellitus without complications: Secondary | ICD-10-CM | POA: Diagnosis not present

## 2022-11-12 DIAGNOSIS — R2689 Other abnormalities of gait and mobility: Secondary | ICD-10-CM | POA: Diagnosis not present

## 2022-11-12 DIAGNOSIS — Z7901 Long term (current) use of anticoagulants: Secondary | ICD-10-CM | POA: Diagnosis not present

## 2022-11-12 DIAGNOSIS — N184 Chronic kidney disease, stage 4 (severe): Secondary | ICD-10-CM | POA: Diagnosis present

## 2022-11-12 DIAGNOSIS — Z9981 Dependence on supplemental oxygen: Secondary | ICD-10-CM | POA: Diagnosis not present

## 2022-11-12 DIAGNOSIS — I252 Old myocardial infarction: Secondary | ICD-10-CM | POA: Diagnosis not present

## 2022-11-12 DIAGNOSIS — M62561 Muscle wasting and atrophy, not elsewhere classified, right lower leg: Secondary | ICD-10-CM | POA: Diagnosis not present

## 2022-11-12 DIAGNOSIS — I129 Hypertensive chronic kidney disease with stage 1 through stage 4 chronic kidney disease, or unspecified chronic kidney disease: Secondary | ICD-10-CM | POA: Diagnosis not present

## 2022-11-12 DIAGNOSIS — R54 Age-related physical debility: Secondary | ICD-10-CM | POA: Diagnosis present

## 2022-11-12 DIAGNOSIS — D72829 Elevated white blood cell count, unspecified: Secondary | ICD-10-CM | POA: Diagnosis not present

## 2022-11-12 DIAGNOSIS — I130 Hypertensive heart and chronic kidney disease with heart failure and stage 1 through stage 4 chronic kidney disease, or unspecified chronic kidney disease: Secondary | ICD-10-CM | POA: Diagnosis present

## 2022-11-12 DIAGNOSIS — Z888 Allergy status to other drugs, medicaments and biological substances status: Secondary | ICD-10-CM | POA: Diagnosis not present

## 2022-11-12 DIAGNOSIS — Z1152 Encounter for screening for COVID-19: Secondary | ICD-10-CM | POA: Diagnosis not present

## 2022-11-14 DIAGNOSIS — I480 Paroxysmal atrial fibrillation: Secondary | ICD-10-CM | POA: Diagnosis not present

## 2022-11-14 DIAGNOSIS — J189 Pneumonia, unspecified organism: Secondary | ICD-10-CM | POA: Diagnosis not present

## 2022-11-14 DIAGNOSIS — I10 Essential (primary) hypertension: Secondary | ICD-10-CM | POA: Diagnosis not present

## 2022-11-14 DIAGNOSIS — I5032 Chronic diastolic (congestive) heart failure: Secondary | ICD-10-CM | POA: Diagnosis not present

## 2022-11-14 DIAGNOSIS — E119 Type 2 diabetes mellitus without complications: Secondary | ICD-10-CM | POA: Diagnosis not present

## 2022-11-14 DIAGNOSIS — R41841 Cognitive communication deficit: Secondary | ICD-10-CM | POA: Diagnosis not present

## 2022-11-14 DIAGNOSIS — F419 Anxiety disorder, unspecified: Secondary | ICD-10-CM | POA: Diagnosis not present

## 2022-11-14 DIAGNOSIS — N184 Chronic kidney disease, stage 4 (severe): Secondary | ICD-10-CM | POA: Diagnosis not present

## 2022-11-14 DIAGNOSIS — E871 Hypo-osmolality and hyponatremia: Secondary | ICD-10-CM | POA: Diagnosis not present

## 2022-11-14 DIAGNOSIS — E1122 Type 2 diabetes mellitus with diabetic chronic kidney disease: Secondary | ICD-10-CM | POA: Diagnosis not present

## 2022-11-14 DIAGNOSIS — Z7901 Long term (current) use of anticoagulants: Secondary | ICD-10-CM | POA: Diagnosis not present

## 2022-11-14 DIAGNOSIS — R609 Edema, unspecified: Secondary | ICD-10-CM | POA: Diagnosis not present

## 2022-11-14 DIAGNOSIS — D631 Anemia in chronic kidney disease: Secondary | ICD-10-CM | POA: Diagnosis not present

## 2022-11-14 DIAGNOSIS — D649 Anemia, unspecified: Secondary | ICD-10-CM | POA: Diagnosis not present

## 2022-11-14 DIAGNOSIS — R0602 Shortness of breath: Secondary | ICD-10-CM | POA: Diagnosis not present

## 2022-11-14 DIAGNOSIS — M62562 Muscle wasting and atrophy, not elsewhere classified, left lower leg: Secondary | ICD-10-CM | POA: Diagnosis not present

## 2022-11-14 DIAGNOSIS — D72829 Elevated white blood cell count, unspecified: Secondary | ICD-10-CM | POA: Diagnosis not present

## 2022-11-14 DIAGNOSIS — R2689 Other abnormalities of gait and mobility: Secondary | ICD-10-CM | POA: Diagnosis not present

## 2022-11-14 DIAGNOSIS — Z299 Encounter for prophylactic measures, unspecified: Secondary | ICD-10-CM | POA: Diagnosis not present

## 2022-11-14 DIAGNOSIS — M62561 Muscle wasting and atrophy, not elsewhere classified, right lower leg: Secondary | ICD-10-CM | POA: Diagnosis not present

## 2022-11-14 DIAGNOSIS — I252 Old myocardial infarction: Secondary | ICD-10-CM | POA: Diagnosis not present

## 2022-11-14 DIAGNOSIS — R531 Weakness: Secondary | ICD-10-CM | POA: Diagnosis not present

## 2022-11-24 ENCOUNTER — Inpatient Hospital Stay: Payer: Medicare Other

## 2022-11-27 DIAGNOSIS — R609 Edema, unspecified: Secondary | ICD-10-CM | POA: Diagnosis not present

## 2022-11-28 DIAGNOSIS — Z299 Encounter for prophylactic measures, unspecified: Secondary | ICD-10-CM | POA: Diagnosis not present

## 2022-12-04 DIAGNOSIS — R531 Weakness: Secondary | ICD-10-CM | POA: Diagnosis not present

## 2022-12-08 ENCOUNTER — Inpatient Hospital Stay: Payer: Medicare Other

## 2022-12-08 ENCOUNTER — Inpatient Hospital Stay: Payer: Medicare Other | Admitting: Physician Assistant

## 2022-12-10 DIAGNOSIS — I5022 Chronic systolic (congestive) heart failure: Secondary | ICD-10-CM | POA: Diagnosis not present

## 2022-12-10 DIAGNOSIS — Z09 Encounter for follow-up examination after completed treatment for conditions other than malignant neoplasm: Secondary | ICD-10-CM | POA: Diagnosis not present

## 2022-12-10 DIAGNOSIS — R42 Dizziness and giddiness: Secondary | ICD-10-CM | POA: Diagnosis not present

## 2022-12-10 DIAGNOSIS — R110 Nausea: Secondary | ICD-10-CM | POA: Diagnosis not present

## 2022-12-10 DIAGNOSIS — F322 Major depressive disorder, single episode, severe without psychotic features: Secondary | ICD-10-CM | POA: Diagnosis not present

## 2022-12-17 DIAGNOSIS — Z9181 History of falling: Secondary | ICD-10-CM | POA: Diagnosis not present

## 2022-12-17 DIAGNOSIS — I480 Paroxysmal atrial fibrillation: Secondary | ICD-10-CM | POA: Diagnosis not present

## 2022-12-17 DIAGNOSIS — D631 Anemia in chronic kidney disease: Secondary | ICD-10-CM | POA: Diagnosis not present

## 2022-12-17 DIAGNOSIS — Z955 Presence of coronary angioplasty implant and graft: Secondary | ICD-10-CM | POA: Diagnosis not present

## 2022-12-17 DIAGNOSIS — F419 Anxiety disorder, unspecified: Secondary | ICD-10-CM | POA: Diagnosis not present

## 2022-12-17 DIAGNOSIS — J189 Pneumonia, unspecified organism: Secondary | ICD-10-CM | POA: Diagnosis not present

## 2022-12-17 DIAGNOSIS — I130 Hypertensive heart and chronic kidney disease with heart failure and stage 1 through stage 4 chronic kidney disease, or unspecified chronic kidney disease: Secondary | ICD-10-CM | POA: Diagnosis not present

## 2022-12-17 DIAGNOSIS — Z7901 Long term (current) use of anticoagulants: Secondary | ICD-10-CM | POA: Diagnosis not present

## 2022-12-17 DIAGNOSIS — Z853 Personal history of malignant neoplasm of breast: Secondary | ICD-10-CM | POA: Diagnosis not present

## 2022-12-17 DIAGNOSIS — I252 Old myocardial infarction: Secondary | ICD-10-CM | POA: Diagnosis not present

## 2022-12-17 DIAGNOSIS — I5032 Chronic diastolic (congestive) heart failure: Secondary | ICD-10-CM | POA: Diagnosis not present

## 2022-12-17 DIAGNOSIS — Z9011 Acquired absence of right breast and nipple: Secondary | ICD-10-CM | POA: Diagnosis not present

## 2022-12-17 DIAGNOSIS — N184 Chronic kidney disease, stage 4 (severe): Secondary | ICD-10-CM | POA: Diagnosis not present

## 2022-12-17 DIAGNOSIS — E1122 Type 2 diabetes mellitus with diabetic chronic kidney disease: Secondary | ICD-10-CM | POA: Diagnosis not present

## 2022-12-17 DIAGNOSIS — D509 Iron deficiency anemia, unspecified: Secondary | ICD-10-CM | POA: Diagnosis not present

## 2022-12-19 DIAGNOSIS — E1122 Type 2 diabetes mellitus with diabetic chronic kidney disease: Secondary | ICD-10-CM | POA: Diagnosis not present

## 2022-12-19 DIAGNOSIS — J189 Pneumonia, unspecified organism: Secondary | ICD-10-CM | POA: Diagnosis not present

## 2022-12-19 DIAGNOSIS — D631 Anemia in chronic kidney disease: Secondary | ICD-10-CM | POA: Diagnosis not present

## 2022-12-19 DIAGNOSIS — I130 Hypertensive heart and chronic kidney disease with heart failure and stage 1 through stage 4 chronic kidney disease, or unspecified chronic kidney disease: Secondary | ICD-10-CM | POA: Diagnosis not present

## 2022-12-19 DIAGNOSIS — I5032 Chronic diastolic (congestive) heart failure: Secondary | ICD-10-CM | POA: Diagnosis not present

## 2022-12-19 DIAGNOSIS — N184 Chronic kidney disease, stage 4 (severe): Secondary | ICD-10-CM | POA: Diagnosis not present

## 2022-12-23 DIAGNOSIS — J189 Pneumonia, unspecified organism: Secondary | ICD-10-CM | POA: Diagnosis not present

## 2022-12-23 DIAGNOSIS — D631 Anemia in chronic kidney disease: Secondary | ICD-10-CM | POA: Diagnosis not present

## 2022-12-23 DIAGNOSIS — I5032 Chronic diastolic (congestive) heart failure: Secondary | ICD-10-CM | POA: Diagnosis not present

## 2022-12-23 DIAGNOSIS — E1122 Type 2 diabetes mellitus with diabetic chronic kidney disease: Secondary | ICD-10-CM | POA: Diagnosis not present

## 2022-12-23 DIAGNOSIS — I130 Hypertensive heart and chronic kidney disease with heart failure and stage 1 through stage 4 chronic kidney disease, or unspecified chronic kidney disease: Secondary | ICD-10-CM | POA: Diagnosis not present

## 2022-12-23 DIAGNOSIS — N184 Chronic kidney disease, stage 4 (severe): Secondary | ICD-10-CM | POA: Diagnosis not present

## 2022-12-29 DIAGNOSIS — I5032 Chronic diastolic (congestive) heart failure: Secondary | ICD-10-CM | POA: Diagnosis not present

## 2022-12-29 DIAGNOSIS — E1122 Type 2 diabetes mellitus with diabetic chronic kidney disease: Secondary | ICD-10-CM | POA: Diagnosis not present

## 2022-12-29 DIAGNOSIS — J189 Pneumonia, unspecified organism: Secondary | ICD-10-CM | POA: Diagnosis not present

## 2022-12-29 DIAGNOSIS — D631 Anemia in chronic kidney disease: Secondary | ICD-10-CM | POA: Diagnosis not present

## 2022-12-29 DIAGNOSIS — I130 Hypertensive heart and chronic kidney disease with heart failure and stage 1 through stage 4 chronic kidney disease, or unspecified chronic kidney disease: Secondary | ICD-10-CM | POA: Diagnosis not present

## 2022-12-29 DIAGNOSIS — N184 Chronic kidney disease, stage 4 (severe): Secondary | ICD-10-CM | POA: Diagnosis not present

## 2023-01-01 ENCOUNTER — Other Ambulatory Visit (HOSPITAL_COMMUNITY)
Admission: RE | Admit: 2023-01-01 | Discharge: 2023-01-01 | Disposition: A | Discharge status: Home / Self Care | Payer: Medicare Other | Source: Ambulatory Visit | Attending: Nephrology | Admitting: Nephrology

## 2023-01-01 DIAGNOSIS — R809 Proteinuria, unspecified: Secondary | ICD-10-CM | POA: Insufficient documentation

## 2023-01-01 DIAGNOSIS — N184 Chronic kidney disease, stage 4 (severe): Secondary | ICD-10-CM | POA: Diagnosis not present

## 2023-01-01 DIAGNOSIS — D631 Anemia in chronic kidney disease: Secondary | ICD-10-CM | POA: Insufficient documentation

## 2023-01-01 LAB — CBC
HCT: 35.8 % — ABNORMAL LOW (ref 36.0–46.0)
Hemoglobin: 11.1 g/dL — ABNORMAL LOW (ref 12.0–15.0)
MCH: 26.5 pg (ref 26.0–34.0)
MCHC: 31 g/dL (ref 30.0–36.0)
MCV: 85.4 fL (ref 80.0–100.0)
Platelets: 349 10*3/uL (ref 150–400)
RBC: 4.19 MIL/uL (ref 3.87–5.11)
RDW: 18.6 % — ABNORMAL HIGH (ref 11.5–15.5)
WBC: 8.4 10*3/uL (ref 4.0–10.5)
nRBC: 0 % (ref 0.0–0.2)

## 2023-01-01 LAB — RENAL FUNCTION PANEL
Albumin: 3.6 g/dL (ref 3.5–5.0)
Anion gap: 12 (ref 5–15)
BUN: 52 mg/dL — ABNORMAL HIGH (ref 8–23)
CO2: 23 mmol/L (ref 22–32)
Calcium: 9.4 mg/dL (ref 8.9–10.3)
Chloride: 104 mmol/L (ref 98–111)
Creatinine, Ser: 2.76 mg/dL — ABNORMAL HIGH (ref 0.44–1.00)
GFR, Estimated: 16 mL/min — ABNORMAL LOW (ref >60)
Glucose, Bld: 141 mg/dL — ABNORMAL HIGH (ref 70–99)
Phosphorus: 4.8 mg/dL — ABNORMAL HIGH (ref 2.5–4.6)
Potassium: 4.2 mmol/L (ref 3.5–5.1)
Sodium: 139 mmol/L (ref 135–145)

## 2023-01-01 LAB — PROTEIN / CREATININE RATIO, URINE
Creatinine, Urine: 77 mg/dL
Protein Creatinine Ratio: 0.22 mg/mg{creat} — ABNORMAL HIGH (ref 0.00–0.15)
Total Protein, Urine: 17 mg/dL

## 2023-01-02 DIAGNOSIS — I130 Hypertensive heart and chronic kidney disease with heart failure and stage 1 through stage 4 chronic kidney disease, or unspecified chronic kidney disease: Secondary | ICD-10-CM | POA: Diagnosis not present

## 2023-01-02 DIAGNOSIS — J189 Pneumonia, unspecified organism: Secondary | ICD-10-CM | POA: Diagnosis not present

## 2023-01-02 DIAGNOSIS — N184 Chronic kidney disease, stage 4 (severe): Secondary | ICD-10-CM | POA: Diagnosis not present

## 2023-01-02 DIAGNOSIS — D631 Anemia in chronic kidney disease: Secondary | ICD-10-CM | POA: Diagnosis not present

## 2023-01-02 DIAGNOSIS — I5032 Chronic diastolic (congestive) heart failure: Secondary | ICD-10-CM | POA: Diagnosis not present

## 2023-01-02 DIAGNOSIS — E1122 Type 2 diabetes mellitus with diabetic chronic kidney disease: Secondary | ICD-10-CM | POA: Diagnosis not present

## 2023-01-05 DIAGNOSIS — E1122 Type 2 diabetes mellitus with diabetic chronic kidney disease: Secondary | ICD-10-CM | POA: Diagnosis not present

## 2023-01-05 DIAGNOSIS — J189 Pneumonia, unspecified organism: Secondary | ICD-10-CM | POA: Diagnosis not present

## 2023-01-05 DIAGNOSIS — I5032 Chronic diastolic (congestive) heart failure: Secondary | ICD-10-CM | POA: Diagnosis not present

## 2023-01-05 DIAGNOSIS — N184 Chronic kidney disease, stage 4 (severe): Secondary | ICD-10-CM | POA: Diagnosis not present

## 2023-01-05 DIAGNOSIS — I130 Hypertensive heart and chronic kidney disease with heart failure and stage 1 through stage 4 chronic kidney disease, or unspecified chronic kidney disease: Secondary | ICD-10-CM | POA: Diagnosis not present

## 2023-01-05 DIAGNOSIS — D631 Anemia in chronic kidney disease: Secondary | ICD-10-CM | POA: Diagnosis not present

## 2023-01-08 DIAGNOSIS — J189 Pneumonia, unspecified organism: Secondary | ICD-10-CM | POA: Diagnosis not present

## 2023-01-08 DIAGNOSIS — M79672 Pain in left foot: Secondary | ICD-10-CM | POA: Diagnosis not present

## 2023-01-08 DIAGNOSIS — D631 Anemia in chronic kidney disease: Secondary | ICD-10-CM | POA: Diagnosis not present

## 2023-01-08 DIAGNOSIS — I5032 Chronic diastolic (congestive) heart failure: Secondary | ICD-10-CM | POA: Diagnosis not present

## 2023-01-08 DIAGNOSIS — L03032 Cellulitis of left toe: Secondary | ICD-10-CM | POA: Diagnosis not present

## 2023-01-08 DIAGNOSIS — L600 Ingrowing nail: Secondary | ICD-10-CM | POA: Diagnosis not present

## 2023-01-08 DIAGNOSIS — N184 Chronic kidney disease, stage 4 (severe): Secondary | ICD-10-CM | POA: Diagnosis not present

## 2023-01-08 DIAGNOSIS — I130 Hypertensive heart and chronic kidney disease with heart failure and stage 1 through stage 4 chronic kidney disease, or unspecified chronic kidney disease: Secondary | ICD-10-CM | POA: Diagnosis not present

## 2023-01-08 DIAGNOSIS — E1122 Type 2 diabetes mellitus with diabetic chronic kidney disease: Secondary | ICD-10-CM | POA: Diagnosis not present

## 2023-01-08 DIAGNOSIS — M79675 Pain in left toe(s): Secondary | ICD-10-CM | POA: Diagnosis not present

## 2023-01-12 DIAGNOSIS — I5032 Chronic diastolic (congestive) heart failure: Secondary | ICD-10-CM | POA: Diagnosis not present

## 2023-01-12 DIAGNOSIS — N184 Chronic kidney disease, stage 4 (severe): Secondary | ICD-10-CM | POA: Diagnosis not present

## 2023-01-12 DIAGNOSIS — D631 Anemia in chronic kidney disease: Secondary | ICD-10-CM | POA: Diagnosis not present

## 2023-01-12 DIAGNOSIS — E1122 Type 2 diabetes mellitus with diabetic chronic kidney disease: Secondary | ICD-10-CM | POA: Diagnosis not present

## 2023-01-12 DIAGNOSIS — I130 Hypertensive heart and chronic kidney disease with heart failure and stage 1 through stage 4 chronic kidney disease, or unspecified chronic kidney disease: Secondary | ICD-10-CM | POA: Diagnosis not present

## 2023-01-12 DIAGNOSIS — J189 Pneumonia, unspecified organism: Secondary | ICD-10-CM | POA: Diagnosis not present

## 2023-01-15 DIAGNOSIS — I130 Hypertensive heart and chronic kidney disease with heart failure and stage 1 through stage 4 chronic kidney disease, or unspecified chronic kidney disease: Secondary | ICD-10-CM | POA: Diagnosis not present

## 2023-01-15 DIAGNOSIS — E1122 Type 2 diabetes mellitus with diabetic chronic kidney disease: Secondary | ICD-10-CM | POA: Diagnosis not present

## 2023-01-15 DIAGNOSIS — I5032 Chronic diastolic (congestive) heart failure: Secondary | ICD-10-CM | POA: Diagnosis not present

## 2023-01-15 DIAGNOSIS — J189 Pneumonia, unspecified organism: Secondary | ICD-10-CM | POA: Diagnosis not present

## 2023-01-15 DIAGNOSIS — N184 Chronic kidney disease, stage 4 (severe): Secondary | ICD-10-CM | POA: Diagnosis not present

## 2023-01-15 DIAGNOSIS — D631 Anemia in chronic kidney disease: Secondary | ICD-10-CM | POA: Diagnosis not present

## 2023-01-16 DIAGNOSIS — N184 Chronic kidney disease, stage 4 (severe): Secondary | ICD-10-CM | POA: Diagnosis not present

## 2023-01-16 DIAGNOSIS — I252 Old myocardial infarction: Secondary | ICD-10-CM | POA: Diagnosis not present

## 2023-01-16 DIAGNOSIS — Z7901 Long term (current) use of anticoagulants: Secondary | ICD-10-CM | POA: Diagnosis not present

## 2023-01-16 DIAGNOSIS — F419 Anxiety disorder, unspecified: Secondary | ICD-10-CM | POA: Diagnosis not present

## 2023-01-16 DIAGNOSIS — Z853 Personal history of malignant neoplasm of breast: Secondary | ICD-10-CM | POA: Diagnosis not present

## 2023-01-16 DIAGNOSIS — Z9181 History of falling: Secondary | ICD-10-CM | POA: Diagnosis not present

## 2023-01-16 DIAGNOSIS — I130 Hypertensive heart and chronic kidney disease with heart failure and stage 1 through stage 4 chronic kidney disease, or unspecified chronic kidney disease: Secondary | ICD-10-CM | POA: Diagnosis not present

## 2023-01-16 DIAGNOSIS — J189 Pneumonia, unspecified organism: Secondary | ICD-10-CM | POA: Diagnosis not present

## 2023-01-16 DIAGNOSIS — I480 Paroxysmal atrial fibrillation: Secondary | ICD-10-CM | POA: Diagnosis not present

## 2023-01-16 DIAGNOSIS — D509 Iron deficiency anemia, unspecified: Secondary | ICD-10-CM | POA: Diagnosis not present

## 2023-01-16 DIAGNOSIS — Z955 Presence of coronary angioplasty implant and graft: Secondary | ICD-10-CM | POA: Diagnosis not present

## 2023-01-16 DIAGNOSIS — I5032 Chronic diastolic (congestive) heart failure: Secondary | ICD-10-CM | POA: Diagnosis not present

## 2023-01-16 DIAGNOSIS — D631 Anemia in chronic kidney disease: Secondary | ICD-10-CM | POA: Diagnosis not present

## 2023-01-16 DIAGNOSIS — Z9011 Acquired absence of right breast and nipple: Secondary | ICD-10-CM | POA: Diagnosis not present

## 2023-01-16 DIAGNOSIS — E1122 Type 2 diabetes mellitus with diabetic chronic kidney disease: Secondary | ICD-10-CM | POA: Diagnosis not present

## 2023-01-23 DIAGNOSIS — I130 Hypertensive heart and chronic kidney disease with heart failure and stage 1 through stage 4 chronic kidney disease, or unspecified chronic kidney disease: Secondary | ICD-10-CM | POA: Diagnosis not present

## 2023-01-23 DIAGNOSIS — J189 Pneumonia, unspecified organism: Secondary | ICD-10-CM | POA: Diagnosis not present

## 2023-01-23 DIAGNOSIS — I5032 Chronic diastolic (congestive) heart failure: Secondary | ICD-10-CM | POA: Diagnosis not present

## 2023-01-23 DIAGNOSIS — D631 Anemia in chronic kidney disease: Secondary | ICD-10-CM | POA: Diagnosis not present

## 2023-01-23 DIAGNOSIS — E1122 Type 2 diabetes mellitus with diabetic chronic kidney disease: Secondary | ICD-10-CM | POA: Diagnosis not present

## 2023-01-23 DIAGNOSIS — N184 Chronic kidney disease, stage 4 (severe): Secondary | ICD-10-CM | POA: Diagnosis not present

## 2023-01-30 DIAGNOSIS — I5022 Chronic systolic (congestive) heart failure: Secondary | ICD-10-CM | POA: Diagnosis not present

## 2023-01-30 DIAGNOSIS — I4891 Unspecified atrial fibrillation: Secondary | ICD-10-CM | POA: Diagnosis not present

## 2023-01-30 DIAGNOSIS — N184 Chronic kidney disease, stage 4 (severe): Secondary | ICD-10-CM | POA: Diagnosis not present

## 2023-01-30 DIAGNOSIS — Z299 Encounter for prophylactic measures, unspecified: Secondary | ICD-10-CM | POA: Diagnosis not present

## 2023-01-30 DIAGNOSIS — I700 Atherosclerosis of aorta: Secondary | ICD-10-CM | POA: Diagnosis not present

## 2023-02-02 ENCOUNTER — Encounter: Payer: Self-pay | Admitting: Cardiology

## 2023-02-02 ENCOUNTER — Ambulatory Visit: Payer: Medicare Other | Attending: Cardiology | Admitting: Cardiology

## 2023-02-02 VITALS — BP 124/44 | HR 63 | Ht 65.0 in | Wt 120.4 lb

## 2023-02-02 DIAGNOSIS — N184 Chronic kidney disease, stage 4 (severe): Secondary | ICD-10-CM | POA: Insufficient documentation

## 2023-02-02 DIAGNOSIS — I25119 Atherosclerotic heart disease of native coronary artery with unspecified angina pectoris: Secondary | ICD-10-CM | POA: Insufficient documentation

## 2023-02-02 DIAGNOSIS — E1122 Type 2 diabetes mellitus with diabetic chronic kidney disease: Secondary | ICD-10-CM | POA: Diagnosis not present

## 2023-02-02 DIAGNOSIS — I10 Essential (primary) hypertension: Secondary | ICD-10-CM | POA: Insufficient documentation

## 2023-02-02 DIAGNOSIS — I130 Hypertensive heart and chronic kidney disease with heart failure and stage 1 through stage 4 chronic kidney disease, or unspecified chronic kidney disease: Secondary | ICD-10-CM | POA: Diagnosis not present

## 2023-02-02 DIAGNOSIS — I480 Paroxysmal atrial fibrillation: Secondary | ICD-10-CM | POA: Diagnosis not present

## 2023-02-02 DIAGNOSIS — I5032 Chronic diastolic (congestive) heart failure: Secondary | ICD-10-CM | POA: Diagnosis not present

## 2023-02-02 DIAGNOSIS — J189 Pneumonia, unspecified organism: Secondary | ICD-10-CM | POA: Diagnosis not present

## 2023-02-02 DIAGNOSIS — D631 Anemia in chronic kidney disease: Secondary | ICD-10-CM | POA: Diagnosis not present

## 2023-02-02 NOTE — Patient Instructions (Signed)

## 2023-02-02 NOTE — Progress Notes (Signed)
    Cardiology Office Note  Date: 02/02/2023   ID: Crystal [name], DOB November 14, 1934, MRN [id number]  History of Present Illness: Crystal [name] is an 89 y.o. female last seen in July 2024.  She is here for a routine visit.  Reports no angina or nitroglycerin  use, no palpitations or syncope.  She is living with family members in [place].  I reviewed her medications.  Current cardiovascular regimen includes Eliquis , amiodarone , Norvasc , Coreg , hydralazine , Imdur , Aldactone , Demadex , and as needed nitroglycerin .  She bruises easily, but does not report any major spontaneous bleeding episodes on Eliquis .  I reviewed her interval lab work.  I reviewed her ECG today which shows normal sinus rhythm with decreased R wave progression.  Physical Exam: VS:  BP (!) 124/44   Pulse 63   Ht 5' 5 (1.651 m)   Wt 120 lb 6.4 oz (54.6 kg)   SpO2 97%   BMI 20.04 kg/m , BMI Body mass index is 20.04 kg/m.  Wt Readings from Last 3 Encounters:  02/02/23 120 lb 6.4 oz (54.6 kg)  10/13/22 129 lb 4.8 oz (58.7 kg)  07/29/22 129 lb (58.5 kg)    General: Patient appears comfortable at rest. HEENT: Conjunctiva and lids normal. Neck: Supple, no elevated JVP or carotid bruits. Lungs: Clear to auscultation, nonlabored breathing at rest. Cardiac: Regular rate and rhythm, no S3, 2/6 systolic murmur. Extremities: No pitting edema.  ECG:  An ECG dated 09/28/2021 was personally reviewed today and demonstrated:  Sinus bradycardia with left atrial enlargement, decreased R wave progression.  Labwork: November 2023: Cholesterol 250, triglycerides 96, HDL 75, LDL 159 01/01/2023: BUN 52; Creatinine, Ser 2.76; Hemoglobin 11.1; Platelets 349; Potassium 4.2; Sodium 139   Other Studies Reviewed Today:  No interval cardiac testing for review today.  Assessment and Plan:  1.  CAD status post DES to the LAD and January 2021, DES to the RCA in December 2021, and patent stent sites by follow-up angiography in January  2022.  LVEF 60 to 65% by echocardiogram in September 2023.  She reports no interval angina or nitroglycerin  use.  Not on aspirin  given use of Eliquis .  Continue Imdur , Norvasc , and as needed nitroglycerin .   2.  Paroxysmal atrial fibrillation with CHA2DS2-VASc score of 7.  She is in sinus rhythm today and reports no significant palpitations.  Continue low-dose amiodarone  along with Eliquis .   3.  Mixed hyperlipidemia.  LDL 159 in November 2023.  She has a history of statin intolerance and has preferred to hold off on other agents.   4.  Primary hypertension.  Blood pressure is well-controlled today on current regimen, no changes were made.  She is on Norvasc , Coreg , Aldactone  and hydralazine .   5.  CKD stage IV.  Creatinine 2.76 in December 2024.  She follows with Dr. Rachele.  Disposition:  Follow up  6 months.  Signed, Crystal [name], M.D., F.A.C.C. [facility]

## 2023-02-03 DIAGNOSIS — M79671 Pain in right foot: Secondary | ICD-10-CM | POA: Diagnosis not present

## 2023-02-03 DIAGNOSIS — I739 Peripheral vascular disease, unspecified: Secondary | ICD-10-CM | POA: Diagnosis not present

## 2023-02-03 DIAGNOSIS — M79674 Pain in right toe(s): Secondary | ICD-10-CM | POA: Diagnosis not present

## 2023-02-03 DIAGNOSIS — M79672 Pain in left foot: Secondary | ICD-10-CM | POA: Diagnosis not present

## 2023-02-03 DIAGNOSIS — M79675 Pain in left toe(s): Secondary | ICD-10-CM | POA: Diagnosis not present

## 2023-02-03 DIAGNOSIS — L609 Nail disorder, unspecified: Secondary | ICD-10-CM | POA: Diagnosis not present

## 2023-02-10 DIAGNOSIS — I5022 Chronic systolic (congestive) heart failure: Secondary | ICD-10-CM | POA: Diagnosis not present

## 2023-02-10 DIAGNOSIS — N184 Chronic kidney disease, stage 4 (severe): Secondary | ICD-10-CM | POA: Diagnosis not present

## 2023-02-10 DIAGNOSIS — I700 Atherosclerosis of aorta: Secondary | ICD-10-CM | POA: Diagnosis not present

## 2023-02-10 DIAGNOSIS — R059 Cough, unspecified: Secondary | ICD-10-CM | POA: Diagnosis not present

## 2023-02-19 DIAGNOSIS — E1122 Type 2 diabetes mellitus with diabetic chronic kidney disease: Secondary | ICD-10-CM | POA: Diagnosis not present

## 2023-02-19 DIAGNOSIS — I5032 Chronic diastolic (congestive) heart failure: Secondary | ICD-10-CM | POA: Diagnosis not present

## 2023-02-19 DIAGNOSIS — E876 Hypokalemia: Secondary | ICD-10-CM | POA: Diagnosis not present

## 2023-02-19 DIAGNOSIS — D631 Anemia in chronic kidney disease: Secondary | ICD-10-CM | POA: Diagnosis not present

## 2023-03-18 DIAGNOSIS — I4891 Unspecified atrial fibrillation: Secondary | ICD-10-CM | POA: Diagnosis not present

## 2023-03-18 DIAGNOSIS — Z299 Encounter for prophylactic measures, unspecified: Secondary | ICD-10-CM | POA: Diagnosis not present

## 2023-03-18 DIAGNOSIS — I700 Atherosclerosis of aorta: Secondary | ICD-10-CM | POA: Diagnosis not present

## 2023-03-18 DIAGNOSIS — N184 Chronic kidney disease, stage 4 (severe): Secondary | ICD-10-CM | POA: Diagnosis not present

## 2023-03-18 DIAGNOSIS — J069 Acute upper respiratory infection, unspecified: Secondary | ICD-10-CM | POA: Diagnosis not present

## 2023-03-18 DIAGNOSIS — D692 Other nonthrombocytopenic purpura: Secondary | ICD-10-CM | POA: Diagnosis not present

## 2023-04-19 ENCOUNTER — Other Ambulatory Visit: Payer: Self-pay | Admitting: Cardiology

## 2023-04-19 DIAGNOSIS — I480 Paroxysmal atrial fibrillation: Secondary | ICD-10-CM

## 2023-04-20 NOTE — Telephone Encounter (Signed)
 Prescription refill request for Eliquis received. Indication: PAF Last office visit: 02/02/23  Ival Bible MD Scr: 2.76 on 01/01/23  Epic Age: 89 Weight: 54.6kg  Based on above findings Eliquis 2.5mg  twice daily is the appropriate dose.  Refill approved.

## 2023-04-21 DIAGNOSIS — M79675 Pain in left toe(s): Secondary | ICD-10-CM | POA: Diagnosis not present

## 2023-04-21 DIAGNOSIS — M79672 Pain in left foot: Secondary | ICD-10-CM | POA: Diagnosis not present

## 2023-04-21 DIAGNOSIS — M79674 Pain in right toe(s): Secondary | ICD-10-CM | POA: Diagnosis not present

## 2023-04-21 DIAGNOSIS — M79671 Pain in right foot: Secondary | ICD-10-CM | POA: Diagnosis not present

## 2023-04-21 DIAGNOSIS — L609 Nail disorder, unspecified: Secondary | ICD-10-CM | POA: Diagnosis not present

## 2023-04-21 DIAGNOSIS — L110 Acquired keratosis follicularis: Secondary | ICD-10-CM | POA: Diagnosis not present

## 2023-04-21 DIAGNOSIS — I739 Peripheral vascular disease, unspecified: Secondary | ICD-10-CM | POA: Diagnosis not present

## 2023-04-27 ENCOUNTER — Other Ambulatory Visit: Payer: Self-pay | Admitting: Cardiology

## 2023-05-04 ENCOUNTER — Other Ambulatory Visit: Payer: Self-pay | Admitting: Cardiology

## 2023-05-11 ENCOUNTER — Encounter: Payer: Self-pay | Admitting: Hematology

## 2023-05-11 ENCOUNTER — Other Ambulatory Visit (HOSPITAL_COMMUNITY)
Admission: RE | Admit: 2023-05-11 | Discharge: 2023-05-11 | Disposition: A | Discharge status: Home / Self Care | Source: Ambulatory Visit | Attending: Nephrology | Admitting: Nephrology

## 2023-05-11 DIAGNOSIS — N189 Chronic kidney disease, unspecified: Secondary | ICD-10-CM | POA: Diagnosis not present

## 2023-05-11 LAB — RENAL FUNCTION PANEL
Albumin: 3.8 g/dL (ref 3.5–5.0)
Anion gap: 13 (ref 5–15)
BUN: 82 mg/dL — ABNORMAL HIGH (ref 8–23)
CO2: 16 mmol/L — ABNORMAL LOW (ref 22–32)
Calcium: 9.6 mg/dL (ref 8.9–10.3)
Chloride: 103 mmol/L (ref 98–111)
Creatinine, Ser: 3.89 mg/dL — ABNORMAL HIGH (ref 0.44–1.00)
GFR, Estimated: 11 mL/min — ABNORMAL LOW (ref >60)
Glucose, Bld: 124 mg/dL — ABNORMAL HIGH (ref 70–99)
Phosphorus: 4.3 mg/dL (ref 2.5–4.6)
Potassium: 5.3 mmol/L — ABNORMAL HIGH (ref 3.5–5.1)
Sodium: 132 mmol/L — ABNORMAL LOW (ref 135–145)

## 2023-05-11 LAB — CBC
HCT: 33 % — ABNORMAL LOW (ref 36.0–46.0)
Hemoglobin: 10.4 g/dL — ABNORMAL LOW (ref 12.0–15.0)
MCH: 28.5 pg (ref 26.0–34.0)
MCHC: 31.5 g/dL (ref 30.0–36.0)
MCV: 90.4 fL (ref 80.0–100.0)
Platelets: 305 10*3/uL (ref 150–400)
RBC: 3.65 MIL/uL — ABNORMAL LOW (ref 3.87–5.11)
RDW: 16 % — ABNORMAL HIGH (ref 11.5–15.5)
WBC: 8.2 10*3/uL (ref 4.0–10.5)
nRBC: 0 % (ref 0.0–0.2)

## 2023-05-11 LAB — IRON AND TIBC
Iron: 75 ug/dL (ref 28–170)
Saturation Ratios: 23 % (ref 10.4–31.8)
TIBC: 328 ug/dL (ref 250–450)
UIBC: 253 ug/dL

## 2023-05-11 LAB — PROTEIN / CREATININE RATIO, URINE
Creatinine, Urine: 45 mg/dL
Total Protein, Urine: <6 mg/dL

## 2023-05-11 LAB — FERRITIN: Ferritin: 431 ng/mL — ABNORMAL HIGH (ref 11–307)

## 2023-05-12 LAB — PARATHYROID HORMONE, INTACT (NO CA): PTH: 31 pg/mL (ref 15–65)

## 2023-05-27 DIAGNOSIS — N179 Acute kidney failure, unspecified: Secondary | ICD-10-CM | POA: Diagnosis not present

## 2023-05-27 DIAGNOSIS — N184 Chronic kidney disease, stage 4 (severe): Secondary | ICD-10-CM | POA: Diagnosis not present

## 2023-05-27 DIAGNOSIS — R809 Proteinuria, unspecified: Secondary | ICD-10-CM | POA: Diagnosis not present

## 2023-05-27 DIAGNOSIS — E1129 Type 2 diabetes mellitus with other diabetic kidney complication: Secondary | ICD-10-CM | POA: Diagnosis not present

## 2023-06-05 DIAGNOSIS — I4891 Unspecified atrial fibrillation: Secondary | ICD-10-CM | POA: Diagnosis not present

## 2023-06-05 DIAGNOSIS — N184 Chronic kidney disease, stage 4 (severe): Secondary | ICD-10-CM | POA: Diagnosis not present

## 2023-06-05 DIAGNOSIS — I700 Atherosclerosis of aorta: Secondary | ICD-10-CM | POA: Diagnosis not present

## 2023-06-05 DIAGNOSIS — I5022 Chronic systolic (congestive) heart failure: Secondary | ICD-10-CM | POA: Diagnosis not present

## 2023-06-05 DIAGNOSIS — J069 Acute upper respiratory infection, unspecified: Secondary | ICD-10-CM | POA: Diagnosis not present

## 2023-06-06 ENCOUNTER — Emergency Department (HOSPITAL_COMMUNITY)

## 2023-06-06 ENCOUNTER — Encounter (HOSPITAL_COMMUNITY): Payer: Self-pay | Admitting: Emergency Medicine

## 2023-06-06 ENCOUNTER — Emergency Department (HOSPITAL_COMMUNITY)
Admission: EM | Admit: 2023-06-06 | Discharge: 2023-06-06 | Disposition: A | Discharge status: Home / Self Care | Attending: Emergency Medicine | Admitting: Emergency Medicine

## 2023-06-06 ENCOUNTER — Other Ambulatory Visit: Payer: Self-pay

## 2023-06-06 DIAGNOSIS — M4186 Other forms of scoliosis, lumbar region: Secondary | ICD-10-CM | POA: Diagnosis not present

## 2023-06-06 DIAGNOSIS — K5730 Diverticulosis of large intestine without perforation or abscess without bleeding: Secondary | ICD-10-CM | POA: Insufficient documentation

## 2023-06-06 DIAGNOSIS — I2510 Atherosclerotic heart disease of native coronary artery without angina pectoris: Secondary | ICD-10-CM | POA: Diagnosis not present

## 2023-06-06 DIAGNOSIS — Z7901 Long term (current) use of anticoagulants: Secondary | ICD-10-CM | POA: Insufficient documentation

## 2023-06-06 DIAGNOSIS — I959 Hypotension, unspecified: Secondary | ICD-10-CM | POA: Diagnosis not present

## 2023-06-06 DIAGNOSIS — E119 Type 2 diabetes mellitus without complications: Secondary | ICD-10-CM | POA: Insufficient documentation

## 2023-06-06 DIAGNOSIS — I509 Heart failure, unspecified: Secondary | ICD-10-CM | POA: Diagnosis not present

## 2023-06-06 DIAGNOSIS — M549 Dorsalgia, unspecified: Secondary | ICD-10-CM | POA: Diagnosis not present

## 2023-06-06 DIAGNOSIS — R29890 Loss of height: Secondary | ICD-10-CM | POA: Diagnosis not present

## 2023-06-06 DIAGNOSIS — S3993XA Unspecified injury of pelvis, initial encounter: Secondary | ICD-10-CM | POA: Diagnosis not present

## 2023-06-06 DIAGNOSIS — I10 Essential (primary) hypertension: Secondary | ICD-10-CM | POA: Diagnosis not present

## 2023-06-06 DIAGNOSIS — M545 Low back pain, unspecified: Secondary | ICD-10-CM | POA: Diagnosis not present

## 2023-06-06 DIAGNOSIS — S22000A Wedge compression fracture of unspecified thoracic vertebra, initial encounter for closed fracture: Secondary | ICD-10-CM

## 2023-06-06 DIAGNOSIS — M4854XA Collapsed vertebra, not elsewhere classified, thoracic region, initial encounter for fracture: Secondary | ICD-10-CM | POA: Diagnosis not present

## 2023-06-06 DIAGNOSIS — S22089A Unspecified fracture of T11-T12 vertebra, initial encounter for closed fracture: Secondary | ICD-10-CM | POA: Diagnosis not present

## 2023-06-06 DIAGNOSIS — S29002A Unspecified injury of muscle and tendon of back wall of thorax, initial encounter: Secondary | ICD-10-CM | POA: Diagnosis present

## 2023-06-06 DIAGNOSIS — S22080A Wedge compression fracture of T11-T12 vertebra, initial encounter for closed fracture: Secondary | ICD-10-CM | POA: Diagnosis not present

## 2023-06-06 DIAGNOSIS — M5136 Other intervertebral disc degeneration, lumbar region with discogenic back pain only: Secondary | ICD-10-CM | POA: Diagnosis not present

## 2023-06-06 DIAGNOSIS — W19XXXA Unspecified fall, initial encounter: Secondary | ICD-10-CM | POA: Diagnosis not present

## 2023-06-06 DIAGNOSIS — R109 Unspecified abdominal pain: Secondary | ICD-10-CM | POA: Diagnosis not present

## 2023-06-06 LAB — CBC WITH DIFFERENTIAL/PLATELET
Abs Immature Granulocytes: 0.45 10*3/uL — ABNORMAL HIGH (ref 0.00–0.07)
Basophils Absolute: 0.1 10*3/uL (ref 0.0–0.1)
Basophils Relative: 1 %
Eosinophils Absolute: 0.2 10*3/uL (ref 0.0–0.5)
Eosinophils Relative: 2 %
HCT: 31.6 % — ABNORMAL LOW (ref 36.0–46.0)
Hemoglobin: 9.8 g/dL — ABNORMAL LOW (ref 12.0–15.0)
Immature Granulocytes: 4 %
Lymphocytes Relative: 10 %
Lymphs Abs: 1.1 10*3/uL (ref 0.7–4.0)
MCH: 27.9 pg (ref 26.0–34.0)
MCHC: 31 g/dL (ref 30.0–36.0)
MCV: 90 fL (ref 80.0–100.0)
Monocytes Absolute: 1.1 10*3/uL — ABNORMAL HIGH (ref 0.1–1.0)
Monocytes Relative: 10 %
Neutro Abs: 8.4 10*3/uL — ABNORMAL HIGH (ref 1.7–7.7)
Neutrophils Relative %: 73 %
Platelets: 287 10*3/uL (ref 150–400)
RBC: 3.51 MIL/uL — ABNORMAL LOW (ref 3.87–5.11)
RDW: 14.6 % (ref 11.5–15.5)
WBC: 11.3 10*3/uL — ABNORMAL HIGH (ref 4.0–10.5)
nRBC: 0 % (ref 0.0–0.2)

## 2023-06-06 LAB — BASIC METABOLIC PANEL WITH GFR
Anion gap: 10 (ref 5–15)
BUN: 56 mg/dL — ABNORMAL HIGH (ref 8–23)
CO2: 18 mmol/L — ABNORMAL LOW (ref 22–32)
Calcium: 9.2 mg/dL (ref 8.9–10.3)
Chloride: 109 mmol/L (ref 98–111)
Creatinine, Ser: 2.7 mg/dL — ABNORMAL HIGH (ref 0.44–1.00)
GFR, Estimated: 17 mL/min — ABNORMAL LOW (ref >60)
Glucose, Bld: 124 mg/dL — ABNORMAL HIGH (ref 70–99)
Potassium: 4.4 mmol/L (ref 3.5–5.1)
Sodium: 137 mmol/L (ref 135–145)

## 2023-06-06 MED ORDER — OXYCODONE-ACETAMINOPHEN 5-325 MG PO TABS: 1.0000 | ORAL_TABLET | Freq: Four times a day (QID) | ORAL | 0 refills | Status: DC | PRN

## 2023-06-06 MED ORDER — HYDROMORPHONE HCL 1 MG/ML IJ SOLN
0.5000 mg | Freq: Once | INTRAMUSCULAR | Status: AC
  Administered 2023-06-06: 0.5 mg via INTRAVENOUS
  Filled 2023-06-06: qty 0.5

## 2023-06-06 NOTE — Discharge Instructions (Addendum)
 Follow-up with Dr. Charol Copas in [place] or Dr. Phyllis Breeze in [place] for your mild compression fracture in your spine.  Rest and take your pain medicine

## 2023-06-06 NOTE — ED Notes (Signed)
 Patient bladder scanned due to noted abdominal distension noted, patient taken to bathroom and obtained from urinating, MD made aware.

## 2023-06-06 NOTE — ED Notes (Signed)
 Pt placed on bedpan. Pt able to lift buttocks off the bed. Urine sample obtained and at bedside.  Pt repositioned and made more comfortable.

## 2023-06-06 NOTE — ED Triage Notes (Signed)
 Pt had fall on Friday morning around 8am. Pt c/o increasing lower back pain that radiates into L thigh area as well as pain to L arm after fall. Pt denies LOC. Pt on Eliquis . EMS administered 6mg  Morphine  IV en route.

## 2023-06-06 NOTE — ED Provider Notes (Signed)
 [place] EMERGENCY DEPARTMENT AT [facility] Provider Note   CSN: [id number] Arrival date & time: 06/06/23  0549     History  Chief Complaint  Patient presents with   Back Pain    Crystal [name] is a 89 y.o. female.  Patient is an 89 year old female with history of coronary artery disease, type 2 diabetes, hyperlipidemia, CHF.  Patient presenting today for evaluation of low back pain.  She reports falling backwards Monday morning and injuring her back.  She has been having increased pain since.  The pain radiates into her left leg, but she denies any weakness or numbness.  No bowel or bladder complaints.  She was brought here by EMS and has received 6 mg of morphine  in route.       Home Medications Prior to Admission medications   Medication Sig Start Date End Date Taking? Authorizing Provider  acetaminophen  (TYLENOL ) 500 MG tablet Take 500 mg by mouth every 6 (six) hours as needed for headache (pain).    [provider]  amiodarone  (PACERONE ) 200 MG tablet Take 0.5 tablets (100 mg total) by mouth daily. 10/07/22   Gerard Knight, MD  amLODipine  (NORVASC ) 5 MG tablet Take 5 mg by mouth daily.    [provider]  CALCIUM  CARB-CHOLECALCIFEROL  PO Take 1 tablet by mouth daily.    [provider]  carvedilol  (COREG ) 6.25 MG tablet TAKE 1 TABLET BY MOUTH TWICE DAILY 04/27/23   Gerard Knight, MD  cholecalciferol  (VITAMIN D3) 25 MCG (1000 UT) tablet Take 1,000 Units by mouth daily with breakfast.    [provider]  diazepam  (VALIUM ) 2 MG tablet Take 2 mg by mouth 2 (two) times daily as needed for anxiety.    [provider]  docusate sodium  (COLACE) 100 MG capsule Take 100 mg by mouth daily as needed for mild constipation.    [provider]  ELIQUIS  2.5 MG TABS tablet TAKE 1 TABLET BY MOUTH TWICE DAILY 04/20/23   Gerard Knight, MD  ferrous sulfate  325 (65 FE) MG tablet Take 325 mg by mouth daily before  lunch.    [provider]  hydrALAZINE  (APRESOLINE ) 100 MG tablet Take 1 tablet (100 mg total) by mouth in the morning, at noon, and at bedtime. 08/19/22 08/19/23  Gerard Knight, MD  isosorbide  mononitrate (IMDUR ) 120 MG 24 hr tablet TAKE 1 TABLET BY MOUTH AT BEDTIME 05/04/23   Gerard Knight, MD  loperamide  (IMODIUM ) 2 MG capsule Take 2 mg by mouth as needed for diarrhea or loose stools.    [provider]  meclizine  (ANTIVERT ) 25 MG tablet Take 25 mg by mouth as needed for dizziness.    [provider]  Multiple Vitamin (MULTIVITAMIN WITH MINERALS) TABS tablet Take 1 tablet by mouth daily. Centrum Silver for Women    [provider]  multivitamin-lutein  (OCUVITE-LUTEIN ) CAPS capsule Take 1 capsule by mouth daily before lunch.     [provider]  nitroGLYCERIN  (NITROSTAT ) 0.4 MG SL tablet PLACE 1 TAB UNDER TONGUE EVERY 3 MINUTES AS DIRECTED UP TO 3 TIMES AS NEEDED FOR CHEST PAIN. 01/07/21   Johnie Nailer B, NP  Simethicone  (GAS-X PO) Take 1 capsule by mouth daily as needed (gas).    [provider]  sodium chloride  (OCEAN) 0.65 % SOLN nasal spray Place 1 spray into both nostrils as needed for congestion.    [provider]  spironolactone  (ALDACTONE ) 25 MG tablet Take 25 mg by mouth  daily.    [provider]  torsemide  (DEMADEX ) 20 MG tablet Take 10-20 mg by mouth daily. Take 20 mg on M-W-F & 10 mg all other days    [provider]      Allergies    Bactrim [sulfamethoxazole-trimethoprim], Sulfa antibiotics, Clonidine derivatives, Evista [raloxifene hcl], Fosamax [alendronate sodium], Glipizide, Lipitor [atorvastatin calcium ], Losartan , Pravastatin, and Azithromycin    Review of Systems   Review of Systems  All other systems reviewed and are negative.   Physical Exam Updated Vital Signs BP (!) 190/67 (BP Location: Left Arm)   Pulse 79   Temp 98.5 F (36.9 C) (Oral)   Resp 13   Ht 5\' 5"  (1.651 m)    Wt 52.6 kg   SpO2 95%   BMI 19.30 kg/m  Physical Exam Vitals and nursing note reviewed.  Constitutional:      General: She is not in acute distress.    Appearance: She is well-developed. She is not diaphoretic.  HENT:     Head: Normocephalic and atraumatic.  Cardiovascular:     Rate and Rhythm: Normal rate and regular rhythm.     Heart sounds: No murmur heard.    No friction rub. No gallop.  Pulmonary:     Effort: Pulmonary effort is normal. No respiratory distress.     Breath sounds: Normal breath sounds. No wheezing.  Abdominal:     General: Bowel sounds are normal. There is no distension.     Palpations: Abdomen is soft.     Tenderness: There is no abdominal tenderness.  Musculoskeletal:        General: Normal range of motion.     Cervical back: Normal range of motion and neck supple.     Comments: There is tenderness to palpation in the lower lumbar region.  No bony tenderness or step-off.  Skin:    General: Skin is warm and dry.  Neurological:     General: No focal deficit present.     Mental Status: She is alert and oriented to person, place, and time.  Psychiatric:     Comments: Strength is 5 out of 5 in both lower extremities.  Sensation is intact to both lower extremities and feet.     ED Results / Procedures / Treatments   Labs (all labs ordered are listed, but only abnormal results are displayed) Labs Reviewed - No data to display  EKG None  Radiology No results found.  Procedures Procedures    Medications Ordered in ED Medications - No data to display  ED Course/ Medical Decision Making/ A&P  Patient presenting with low back pain after a fall that occurred 5 days ago.  She has had increasing pain in her low back and difficulty moving and ambulating.  Patient arrives with stable vital signs, but does know some tenderness to the lower lumbar region.  She appears to be neurologically intact otherwise.  CT scan of the lumbar spine and pelvis have been  ordered to rule out fracture.  Care to be signed out to oncoming provider at shift change to obtain the results of the imaging studies and determine final disposition.  Final Clinical Impression(s) / ED Diagnoses Final diagnoses:  None    Rx / DC Orders ED Discharge Orders     None         Orvilla Blander, MD 06/06/23 [phone]

## 2023-06-06 NOTE — ED Notes (Signed)
 ED Provider at bedside.

## 2023-06-06 NOTE — ED Provider Notes (Signed)
 Patient with mild T12 compression fracture.  She will be discharged home with Percocets and referred to orthopedics   Cheyenne Cotta, MD 06/06/23 1032

## 2023-06-06 NOTE — ED Notes (Signed)
 Patient transported to CT

## 2023-06-09 DIAGNOSIS — E042 Nontoxic multinodular goiter: Secondary | ICD-10-CM | POA: Diagnosis not present

## 2023-06-09 DIAGNOSIS — N184 Chronic kidney disease, stage 4 (severe): Secondary | ICD-10-CM | POA: Diagnosis not present

## 2023-06-09 DIAGNOSIS — Z888 Allergy status to other drugs, medicaments and biological substances status: Secondary | ICD-10-CM | POA: Diagnosis not present

## 2023-06-09 DIAGNOSIS — I3489 Other nonrheumatic mitral valve disorders: Secondary | ICD-10-CM | POA: Diagnosis not present

## 2023-06-09 DIAGNOSIS — I480 Paroxysmal atrial fibrillation: Secondary | ICD-10-CM | POA: Diagnosis not present

## 2023-06-09 DIAGNOSIS — G9340 Encephalopathy, unspecified: Secondary | ICD-10-CM | POA: Diagnosis not present

## 2023-06-09 DIAGNOSIS — W1830XA Fall on same level, unspecified, initial encounter: Secondary | ICD-10-CM | POA: Diagnosis not present

## 2023-06-09 DIAGNOSIS — M6281 Muscle weakness (generalized): Secondary | ICD-10-CM | POA: Diagnosis not present

## 2023-06-09 DIAGNOSIS — S22082A Unstable burst fracture of T11-T12 vertebra, initial encounter for closed fracture: Secondary | ICD-10-CM | POA: Diagnosis not present

## 2023-06-09 DIAGNOSIS — M545 Low back pain, unspecified: Secondary | ICD-10-CM | POA: Diagnosis not present

## 2023-06-09 DIAGNOSIS — Z20822 Contact with and (suspected) exposure to covid-19: Secondary | ICD-10-CM | POA: Diagnosis not present

## 2023-06-09 DIAGNOSIS — J189 Pneumonia, unspecified organism: Secondary | ICD-10-CM | POA: Diagnosis not present

## 2023-06-09 DIAGNOSIS — R54 Age-related physical debility: Secondary | ICD-10-CM | POA: Diagnosis not present

## 2023-06-09 DIAGNOSIS — I361 Nonrheumatic tricuspid (valve) insufficiency: Secondary | ICD-10-CM | POA: Diagnosis not present

## 2023-06-09 DIAGNOSIS — Z79899 Other long term (current) drug therapy: Secondary | ICD-10-CM | POA: Diagnosis not present

## 2023-06-09 DIAGNOSIS — I130 Hypertensive heart and chronic kidney disease with heart failure and stage 1 through stage 4 chronic kidney disease, or unspecified chronic kidney disease: Secondary | ICD-10-CM | POA: Diagnosis not present

## 2023-06-09 DIAGNOSIS — J984 Other disorders of lung: Secondary | ICD-10-CM | POA: Diagnosis not present

## 2023-06-09 DIAGNOSIS — I2510 Atherosclerotic heart disease of native coronary artery without angina pectoris: Secondary | ICD-10-CM | POA: Diagnosis not present

## 2023-06-09 DIAGNOSIS — D259 Leiomyoma of uterus, unspecified: Secondary | ICD-10-CM | POA: Diagnosis not present

## 2023-06-09 DIAGNOSIS — D649 Anemia, unspecified: Secondary | ICD-10-CM | POA: Diagnosis not present

## 2023-06-09 DIAGNOSIS — I509 Heart failure, unspecified: Secondary | ICD-10-CM | POA: Diagnosis not present

## 2023-06-09 DIAGNOSIS — R402362 Coma scale, best motor response, obeys commands, at arrival to emergency department: Secondary | ICD-10-CM | POA: Diagnosis not present

## 2023-06-09 DIAGNOSIS — M549 Dorsalgia, unspecified: Secondary | ICD-10-CM | POA: Diagnosis not present

## 2023-06-09 DIAGNOSIS — S065X0A Traumatic subdural hemorrhage without loss of consciousness, initial encounter: Secondary | ICD-10-CM | POA: Diagnosis not present

## 2023-06-09 DIAGNOSIS — I5032 Chronic diastolic (congestive) heart failure: Secondary | ICD-10-CM | POA: Diagnosis not present

## 2023-06-09 DIAGNOSIS — S065X0D Traumatic subdural hemorrhage without loss of consciousness, subsequent encounter: Secondary | ICD-10-CM | POA: Diagnosis not present

## 2023-06-09 DIAGNOSIS — E119 Type 2 diabetes mellitus without complications: Secondary | ICD-10-CM | POA: Diagnosis not present

## 2023-06-09 DIAGNOSIS — Z9181 History of falling: Secondary | ICD-10-CM | POA: Diagnosis not present

## 2023-06-09 DIAGNOSIS — M19112 Post-traumatic osteoarthritis, left shoulder: Secondary | ICD-10-CM | POA: Diagnosis not present

## 2023-06-09 DIAGNOSIS — R402252 Coma scale, best verbal response, oriented, at arrival to emergency department: Secondary | ICD-10-CM | POA: Diagnosis not present

## 2023-06-09 DIAGNOSIS — I272 Pulmonary hypertension, unspecified: Secondary | ICD-10-CM | POA: Diagnosis not present

## 2023-06-09 DIAGNOSIS — E875 Hyperkalemia: Secondary | ICD-10-CM | POA: Diagnosis not present

## 2023-06-09 DIAGNOSIS — I10 Essential (primary) hypertension: Secondary | ICD-10-CM | POA: Diagnosis not present

## 2023-06-09 DIAGNOSIS — I5030 Unspecified diastolic (congestive) heart failure: Secondary | ICD-10-CM | POA: Diagnosis not present

## 2023-06-09 DIAGNOSIS — S0990XA Unspecified injury of head, initial encounter: Secondary | ICD-10-CM | POA: Diagnosis not present

## 2023-06-09 DIAGNOSIS — R112 Nausea with vomiting, unspecified: Secondary | ICD-10-CM | POA: Diagnosis not present

## 2023-06-09 DIAGNOSIS — S22080D Wedge compression fracture of T11-T12 vertebra, subsequent encounter for fracture with routine healing: Secondary | ICD-10-CM | POA: Diagnosis not present

## 2023-06-09 DIAGNOSIS — I252 Old myocardial infarction: Secondary | ICD-10-CM | POA: Diagnosis not present

## 2023-06-09 DIAGNOSIS — Z66 Do not resuscitate: Secondary | ICD-10-CM | POA: Diagnosis not present

## 2023-06-09 DIAGNOSIS — N179 Acute kidney failure, unspecified: Secondary | ICD-10-CM | POA: Diagnosis not present

## 2023-06-09 DIAGNOSIS — Z741 Need for assistance with personal care: Secondary | ICD-10-CM | POA: Diagnosis not present

## 2023-06-09 DIAGNOSIS — S32030A Wedge compression fracture of third lumbar vertebra, initial encounter for closed fracture: Secondary | ICD-10-CM | POA: Diagnosis not present

## 2023-06-09 DIAGNOSIS — S22081A Stable burst fracture of T11-T12 vertebra, initial encounter for closed fracture: Secondary | ICD-10-CM | POA: Diagnosis not present

## 2023-06-09 DIAGNOSIS — I340 Nonrheumatic mitral (valve) insufficiency: Secondary | ICD-10-CM | POA: Diagnosis not present

## 2023-06-09 DIAGNOSIS — R221 Localized swelling, mass and lump, neck: Secondary | ICD-10-CM | POA: Diagnosis not present

## 2023-06-09 DIAGNOSIS — R41841 Cognitive communication deficit: Secondary | ICD-10-CM | POA: Diagnosis not present

## 2023-06-09 DIAGNOSIS — N2581 Secondary hyperparathyroidism of renal origin: Secondary | ICD-10-CM | POA: Diagnosis not present

## 2023-06-09 DIAGNOSIS — M62561 Muscle wasting and atrophy, not elsewhere classified, right lower leg: Secondary | ICD-10-CM | POA: Diagnosis not present

## 2023-06-09 DIAGNOSIS — W19XXXA Unspecified fall, initial encounter: Secondary | ICD-10-CM | POA: Diagnosis not present

## 2023-06-09 DIAGNOSIS — R140 Abdominal distension (gaseous): Secondary | ICD-10-CM | POA: Diagnosis not present

## 2023-06-09 DIAGNOSIS — R0902 Hypoxemia: Secondary | ICD-10-CM | POA: Diagnosis not present

## 2023-06-09 DIAGNOSIS — I959 Hypotension, unspecified: Secondary | ICD-10-CM | POA: Diagnosis not present

## 2023-06-09 DIAGNOSIS — M25532 Pain in left wrist: Secondary | ICD-10-CM | POA: Diagnosis not present

## 2023-06-09 DIAGNOSIS — S32010A Wedge compression fracture of first lumbar vertebra, initial encounter for closed fracture: Secondary | ICD-10-CM | POA: Diagnosis not present

## 2023-06-09 DIAGNOSIS — I499 Cardiac arrhythmia, unspecified: Secondary | ICD-10-CM | POA: Diagnosis not present

## 2023-06-09 DIAGNOSIS — K6389 Other specified diseases of intestine: Secondary | ICD-10-CM | POA: Diagnosis not present

## 2023-06-09 DIAGNOSIS — K8020 Calculus of gallbladder without cholecystitis without obstruction: Secondary | ICD-10-CM | POA: Diagnosis not present

## 2023-06-09 DIAGNOSIS — I3481 Nonrheumatic mitral (valve) annulus calcification: Secondary | ICD-10-CM | POA: Diagnosis not present

## 2023-06-09 DIAGNOSIS — K5900 Constipation, unspecified: Secondary | ICD-10-CM | POA: Diagnosis not present

## 2023-06-09 DIAGNOSIS — E785 Hyperlipidemia, unspecified: Secondary | ICD-10-CM | POA: Diagnosis not present

## 2023-06-09 DIAGNOSIS — D472 Monoclonal gammopathy: Secondary | ICD-10-CM | POA: Diagnosis not present

## 2023-06-09 DIAGNOSIS — S199XXA Unspecified injury of neck, initial encounter: Secondary | ICD-10-CM | POA: Diagnosis not present

## 2023-06-09 DIAGNOSIS — S065XAA Traumatic subdural hemorrhage with loss of consciousness status unknown, initial encounter: Secondary | ICD-10-CM | POA: Diagnosis not present

## 2023-06-09 DIAGNOSIS — I110 Hypertensive heart disease with heart failure: Secondary | ICD-10-CM | POA: Diagnosis not present

## 2023-06-09 DIAGNOSIS — Z882 Allergy status to sulfonamides status: Secondary | ICD-10-CM | POA: Diagnosis not present

## 2023-06-09 DIAGNOSIS — M62562 Muscle wasting and atrophy, not elsewhere classified, left lower leg: Secondary | ICD-10-CM | POA: Diagnosis not present

## 2023-06-09 DIAGNOSIS — E1122 Type 2 diabetes mellitus with diabetic chronic kidney disease: Secondary | ICD-10-CM | POA: Diagnosis not present

## 2023-06-09 DIAGNOSIS — M25511 Pain in right shoulder: Secondary | ICD-10-CM | POA: Diagnosis not present

## 2023-06-09 DIAGNOSIS — R1310 Dysphagia, unspecified: Secondary | ICD-10-CM | POA: Diagnosis not present

## 2023-06-09 DIAGNOSIS — S72111A Displaced fracture of greater trochanter of right femur, initial encounter for closed fracture: Secondary | ICD-10-CM | POA: Diagnosis not present

## 2023-06-09 DIAGNOSIS — R402142 Coma scale, eyes open, spontaneous, at arrival to emergency department: Secondary | ICD-10-CM | POA: Diagnosis not present

## 2023-06-09 DIAGNOSIS — R2689 Other abnormalities of gait and mobility: Secondary | ICD-10-CM | POA: Diagnosis not present

## 2023-06-09 DIAGNOSIS — Z881 Allergy status to other antibiotic agents status: Secondary | ICD-10-CM | POA: Diagnosis not present

## 2023-06-09 DIAGNOSIS — R918 Other nonspecific abnormal finding of lung field: Secondary | ICD-10-CM | POA: Diagnosis not present

## 2023-06-09 DIAGNOSIS — R0602 Shortness of breath: Secondary | ICD-10-CM | POA: Diagnosis not present

## 2023-06-09 DIAGNOSIS — S72114A Nondisplaced fracture of greater trochanter of right femur, initial encounter for closed fracture: Secondary | ICD-10-CM | POA: Diagnosis not present

## 2023-06-09 DIAGNOSIS — G9389 Other specified disorders of brain: Secondary | ICD-10-CM | POA: Diagnosis not present

## 2023-06-09 DIAGNOSIS — R42 Dizziness and giddiness: Secondary | ICD-10-CM | POA: Diagnosis not present

## 2023-06-09 DIAGNOSIS — K567 Ileus, unspecified: Secondary | ICD-10-CM | POA: Diagnosis not present

## 2023-06-09 DIAGNOSIS — E876 Hypokalemia: Secondary | ICD-10-CM | POA: Diagnosis not present

## 2023-06-09 DIAGNOSIS — S72111D Displaced fracture of greater trochanter of right femur, subsequent encounter for closed fracture with routine healing: Secondary | ICD-10-CM | POA: Diagnosis not present

## 2023-06-09 DIAGNOSIS — Z743 Need for continuous supervision: Secondary | ICD-10-CM | POA: Diagnosis not present

## 2023-06-09 DIAGNOSIS — S299XXA Unspecified injury of thorax, initial encounter: Secondary | ICD-10-CM | POA: Diagnosis not present

## 2023-06-09 DIAGNOSIS — I4510 Unspecified right bundle-branch block: Secondary | ICD-10-CM | POA: Diagnosis not present

## 2023-06-16 DIAGNOSIS — S72111D Displaced fracture of greater trochanter of right femur, subsequent encounter for closed fracture with routine healing: Secondary | ICD-10-CM | POA: Diagnosis not present

## 2023-06-16 DIAGNOSIS — E785 Hyperlipidemia, unspecified: Secondary | ICD-10-CM | POA: Diagnosis not present

## 2023-06-16 DIAGNOSIS — S065XAA Traumatic subdural hemorrhage with loss of consciousness status unknown, initial encounter: Secondary | ICD-10-CM | POA: Diagnosis not present

## 2023-06-16 DIAGNOSIS — Z8701 Personal history of pneumonia (recurrent): Secondary | ICD-10-CM | POA: Diagnosis not present

## 2023-06-16 DIAGNOSIS — Z79891 Long term (current) use of opiate analgesic: Secondary | ICD-10-CM | POA: Diagnosis not present

## 2023-06-16 DIAGNOSIS — R001 Bradycardia, unspecified: Secondary | ICD-10-CM | POA: Diagnosis not present

## 2023-06-16 DIAGNOSIS — R54 Age-related physical debility: Secondary | ICD-10-CM | POA: Diagnosis not present

## 2023-06-16 DIAGNOSIS — D72829 Elevated white blood cell count, unspecified: Secondary | ICD-10-CM | POA: Diagnosis not present

## 2023-06-16 DIAGNOSIS — Z743 Need for continuous supervision: Secondary | ICD-10-CM | POA: Diagnosis not present

## 2023-06-16 DIAGNOSIS — M62561 Muscle wasting and atrophy, not elsewhere classified, right lower leg: Secondary | ICD-10-CM | POA: Diagnosis not present

## 2023-06-16 DIAGNOSIS — E871 Hypo-osmolality and hyponatremia: Secondary | ICD-10-CM | POA: Diagnosis not present

## 2023-06-16 DIAGNOSIS — Z881 Allergy status to other antibiotic agents status: Secondary | ICD-10-CM | POA: Diagnosis not present

## 2023-06-16 DIAGNOSIS — I959 Hypotension, unspecified: Secondary | ICD-10-CM | POA: Diagnosis not present

## 2023-06-16 DIAGNOSIS — M62562 Muscle wasting and atrophy, not elsewhere classified, left lower leg: Secondary | ICD-10-CM | POA: Diagnosis not present

## 2023-06-16 DIAGNOSIS — Z888 Allergy status to other drugs, medicaments and biological substances status: Secondary | ICD-10-CM | POA: Diagnosis not present

## 2023-06-16 DIAGNOSIS — I5033 Acute on chronic diastolic (congestive) heart failure: Secondary | ICD-10-CM | POA: Diagnosis not present

## 2023-06-16 DIAGNOSIS — N179 Acute kidney failure, unspecified: Secondary | ICD-10-CM | POA: Diagnosis not present

## 2023-06-16 DIAGNOSIS — Z882 Allergy status to sulfonamides status: Secondary | ICD-10-CM | POA: Diagnosis not present

## 2023-06-16 DIAGNOSIS — E875 Hyperkalemia: Secondary | ICD-10-CM | POA: Diagnosis not present

## 2023-06-16 DIAGNOSIS — S065X0D Traumatic subdural hemorrhage without loss of consciousness, subsequent encounter: Secondary | ICD-10-CM | POA: Diagnosis not present

## 2023-06-16 DIAGNOSIS — R0902 Hypoxemia: Secondary | ICD-10-CM | POA: Diagnosis not present

## 2023-06-16 DIAGNOSIS — K5900 Constipation, unspecified: Secondary | ICD-10-CM | POA: Diagnosis not present

## 2023-06-16 DIAGNOSIS — S72001A Fracture of unspecified part of neck of right femur, initial encounter for closed fracture: Secondary | ICD-10-CM | POA: Diagnosis not present

## 2023-06-16 DIAGNOSIS — Z853 Personal history of malignant neoplasm of breast: Secondary | ICD-10-CM | POA: Diagnosis not present

## 2023-06-16 DIAGNOSIS — R42 Dizziness and giddiness: Secondary | ICD-10-CM | POA: Diagnosis not present

## 2023-06-16 DIAGNOSIS — I498 Other specified cardiac arrhythmias: Secondary | ICD-10-CM | POA: Diagnosis not present

## 2023-06-16 DIAGNOSIS — I130 Hypertensive heart and chronic kidney disease with heart failure and stage 1 through stage 4 chronic kidney disease, or unspecified chronic kidney disease: Secondary | ICD-10-CM | POA: Diagnosis not present

## 2023-06-16 DIAGNOSIS — W1830XA Fall on same level, unspecified, initial encounter: Secondary | ICD-10-CM | POA: Diagnosis not present

## 2023-06-16 DIAGNOSIS — Z1152 Encounter for screening for COVID-19: Secondary | ICD-10-CM | POA: Diagnosis not present

## 2023-06-16 DIAGNOSIS — I5032 Chronic diastolic (congestive) heart failure: Secondary | ICD-10-CM | POA: Diagnosis not present

## 2023-06-16 DIAGNOSIS — Z741 Need for assistance with personal care: Secondary | ICD-10-CM | POA: Diagnosis not present

## 2023-06-16 DIAGNOSIS — R2689 Other abnormalities of gait and mobility: Secondary | ICD-10-CM | POA: Diagnosis not present

## 2023-06-16 DIAGNOSIS — I214 Non-ST elevation (NSTEMI) myocardial infarction: Secondary | ICD-10-CM | POA: Diagnosis not present

## 2023-06-16 DIAGNOSIS — R918 Other nonspecific abnormal finding of lung field: Secondary | ICD-10-CM | POA: Diagnosis not present

## 2023-06-16 DIAGNOSIS — Z955 Presence of coronary angioplasty implant and graft: Secondary | ICD-10-CM | POA: Diagnosis not present

## 2023-06-16 DIAGNOSIS — E119 Type 2 diabetes mellitus without complications: Secondary | ICD-10-CM | POA: Diagnosis not present

## 2023-06-16 DIAGNOSIS — Z604 Social exclusion and rejection: Secondary | ICD-10-CM | POA: Diagnosis not present

## 2023-06-16 DIAGNOSIS — S22080D Wedge compression fracture of T11-T12 vertebra, subsequent encounter for fracture with routine healing: Secondary | ICD-10-CM | POA: Diagnosis not present

## 2023-06-16 DIAGNOSIS — R1310 Dysphagia, unspecified: Secondary | ICD-10-CM | POA: Diagnosis not present

## 2023-06-16 DIAGNOSIS — M6281 Muscle weakness (generalized): Secondary | ICD-10-CM | POA: Diagnosis not present

## 2023-06-16 DIAGNOSIS — R41841 Cognitive communication deficit: Secondary | ICD-10-CM | POA: Diagnosis not present

## 2023-06-16 DIAGNOSIS — E860 Dehydration: Secondary | ICD-10-CM | POA: Diagnosis not present

## 2023-06-16 DIAGNOSIS — D649 Anemia, unspecified: Secondary | ICD-10-CM | POA: Diagnosis not present

## 2023-06-16 DIAGNOSIS — I10 Essential (primary) hypertension: Secondary | ICD-10-CM | POA: Diagnosis not present

## 2023-06-16 DIAGNOSIS — Z9181 History of falling: Secondary | ICD-10-CM | POA: Diagnosis not present

## 2023-06-16 DIAGNOSIS — N184 Chronic kidney disease, stage 4 (severe): Secondary | ICD-10-CM | POA: Diagnosis not present

## 2023-06-16 DIAGNOSIS — J189 Pneumonia, unspecified organism: Secondary | ICD-10-CM | POA: Diagnosis not present

## 2023-06-16 DIAGNOSIS — E1122 Type 2 diabetes mellitus with diabetic chronic kidney disease: Secondary | ICD-10-CM | POA: Diagnosis not present

## 2023-06-16 DIAGNOSIS — I480 Paroxysmal atrial fibrillation: Secondary | ICD-10-CM | POA: Diagnosis not present

## 2023-06-16 DIAGNOSIS — R0689 Other abnormalities of breathing: Secondary | ICD-10-CM | POA: Diagnosis not present

## 2023-06-16 DIAGNOSIS — Z66 Do not resuscitate: Secondary | ICD-10-CM | POA: Diagnosis not present

## 2023-06-16 DIAGNOSIS — R079 Chest pain, unspecified: Secondary | ICD-10-CM | POA: Diagnosis not present

## 2023-06-18 DIAGNOSIS — S72001A Fracture of unspecified part of neck of right femur, initial encounter for closed fracture: Secondary | ICD-10-CM | POA: Diagnosis not present

## 2023-06-18 DIAGNOSIS — W1830XA Fall on same level, unspecified, initial encounter: Secondary | ICD-10-CM | POA: Diagnosis not present

## 2023-06-23 DIAGNOSIS — J811 Chronic pulmonary edema: Secondary | ICD-10-CM | POA: Diagnosis not present

## 2023-06-23 DIAGNOSIS — R079 Chest pain, unspecified: Secondary | ICD-10-CM | POA: Diagnosis not present

## 2023-06-23 DIAGNOSIS — I129 Hypertensive chronic kidney disease with stage 1 through stage 4 chronic kidney disease, or unspecified chronic kidney disease: Secondary | ICD-10-CM | POA: Diagnosis not present

## 2023-06-23 DIAGNOSIS — R531 Weakness: Secondary | ICD-10-CM | POA: Diagnosis not present

## 2023-06-23 DIAGNOSIS — E871 Hypo-osmolality and hyponatremia: Secondary | ICD-10-CM | POA: Diagnosis not present

## 2023-06-23 DIAGNOSIS — D72829 Elevated white blood cell count, unspecified: Secondary | ICD-10-CM | POA: Diagnosis not present

## 2023-06-23 DIAGNOSIS — N184 Chronic kidney disease, stage 4 (severe): Secondary | ICD-10-CM | POA: Diagnosis not present

## 2023-06-23 DIAGNOSIS — J90 Pleural effusion, not elsewhere classified: Secondary | ICD-10-CM | POA: Diagnosis not present

## 2023-06-23 DIAGNOSIS — S72111D Displaced fracture of greater trochanter of right femur, subsequent encounter for closed fracture with routine healing: Secondary | ICD-10-CM | POA: Diagnosis not present

## 2023-06-23 DIAGNOSIS — R54 Age-related physical debility: Secondary | ICD-10-CM | POA: Diagnosis not present

## 2023-06-23 DIAGNOSIS — D631 Anemia in chronic kidney disease: Secondary | ICD-10-CM | POA: Diagnosis not present

## 2023-06-23 DIAGNOSIS — D649 Anemia, unspecified: Secondary | ICD-10-CM | POA: Diagnosis not present

## 2023-06-23 DIAGNOSIS — I4891 Unspecified atrial fibrillation: Secondary | ICD-10-CM | POA: Diagnosis not present

## 2023-06-23 DIAGNOSIS — I130 Hypertensive heart and chronic kidney disease with heart failure and stage 1 through stage 4 chronic kidney disease, or unspecified chronic kidney disease: Secondary | ICD-10-CM | POA: Diagnosis not present

## 2023-06-23 DIAGNOSIS — Z9181 History of falling: Secondary | ICD-10-CM | POA: Diagnosis not present

## 2023-06-23 DIAGNOSIS — E875 Hyperkalemia: Secondary | ICD-10-CM | POA: Diagnosis not present

## 2023-06-23 DIAGNOSIS — Z79899 Other long term (current) drug therapy: Secondary | ICD-10-CM | POA: Diagnosis not present

## 2023-06-23 DIAGNOSIS — R7989 Other specified abnormal findings of blood chemistry: Secondary | ICD-10-CM | POA: Diagnosis not present

## 2023-06-23 DIAGNOSIS — I498 Other specified cardiac arrhythmias: Secondary | ICD-10-CM | POA: Diagnosis not present

## 2023-06-23 DIAGNOSIS — Z1152 Encounter for screening for COVID-19: Secondary | ICD-10-CM | POA: Diagnosis not present

## 2023-06-23 DIAGNOSIS — S065X0D Traumatic subdural hemorrhage without loss of consciousness, subsequent encounter: Secondary | ICD-10-CM | POA: Diagnosis not present

## 2023-06-23 DIAGNOSIS — R918 Other nonspecific abnormal finding of lung field: Secondary | ICD-10-CM | POA: Diagnosis not present

## 2023-06-23 DIAGNOSIS — Z66 Do not resuscitate: Secondary | ICD-10-CM | POA: Diagnosis not present

## 2023-06-23 DIAGNOSIS — J9811 Atelectasis: Secondary | ICD-10-CM | POA: Diagnosis not present

## 2023-06-23 DIAGNOSIS — R0902 Hypoxemia: Secondary | ICD-10-CM | POA: Diagnosis not present

## 2023-06-23 DIAGNOSIS — Z8781 Personal history of (healed) traumatic fracture: Secondary | ICD-10-CM | POA: Diagnosis not present

## 2023-06-23 DIAGNOSIS — S22080D Wedge compression fracture of T11-T12 vertebra, subsequent encounter for fracture with routine healing: Secondary | ICD-10-CM | POA: Diagnosis not present

## 2023-06-23 DIAGNOSIS — Z882 Allergy status to sulfonamides status: Secondary | ICD-10-CM | POA: Diagnosis not present

## 2023-06-23 DIAGNOSIS — Z604 Social exclusion and rejection: Secondary | ICD-10-CM | POA: Diagnosis not present

## 2023-06-23 DIAGNOSIS — E1122 Type 2 diabetes mellitus with diabetic chronic kidney disease: Secondary | ICD-10-CM | POA: Diagnosis not present

## 2023-06-23 DIAGNOSIS — S7290XA Unspecified fracture of unspecified femur, initial encounter for closed fracture: Secondary | ICD-10-CM | POA: Diagnosis not present

## 2023-06-23 DIAGNOSIS — R0689 Other abnormalities of breathing: Secondary | ICD-10-CM | POA: Diagnosis not present

## 2023-06-23 DIAGNOSIS — N179 Acute kidney failure, unspecified: Secondary | ICD-10-CM | POA: Diagnosis not present

## 2023-06-23 DIAGNOSIS — Z853 Personal history of malignant neoplasm of breast: Secondary | ICD-10-CM | POA: Diagnosis not present

## 2023-06-23 DIAGNOSIS — J189 Pneumonia, unspecified organism: Secondary | ICD-10-CM | POA: Diagnosis not present

## 2023-06-23 DIAGNOSIS — E860 Dehydration: Secondary | ICD-10-CM | POA: Diagnosis not present

## 2023-06-23 DIAGNOSIS — Z881 Allergy status to other antibiotic agents status: Secondary | ICD-10-CM | POA: Diagnosis not present

## 2023-06-23 DIAGNOSIS — R42 Dizziness and giddiness: Secondary | ICD-10-CM | POA: Diagnosis not present

## 2023-06-23 DIAGNOSIS — W19XXXA Unspecified fall, initial encounter: Secondary | ICD-10-CM | POA: Diagnosis not present

## 2023-06-23 DIAGNOSIS — I214 Non-ST elevation (NSTEMI) myocardial infarction: Secondary | ICD-10-CM | POA: Diagnosis not present

## 2023-06-23 DIAGNOSIS — R001 Bradycardia, unspecified: Secondary | ICD-10-CM | POA: Diagnosis not present

## 2023-06-23 DIAGNOSIS — Z8701 Personal history of pneumonia (recurrent): Secondary | ICD-10-CM | POA: Diagnosis not present

## 2023-06-23 DIAGNOSIS — I480 Paroxysmal atrial fibrillation: Secondary | ICD-10-CM | POA: Diagnosis not present

## 2023-06-23 DIAGNOSIS — I5033 Acute on chronic diastolic (congestive) heart failure: Secondary | ICD-10-CM | POA: Diagnosis not present

## 2023-06-23 DIAGNOSIS — Z955 Presence of coronary angioplasty implant and graft: Secondary | ICD-10-CM | POA: Diagnosis not present

## 2023-06-23 DIAGNOSIS — Z79891 Long term (current) use of opiate analgesic: Secondary | ICD-10-CM | POA: Diagnosis not present

## 2023-06-23 DIAGNOSIS — Z888 Allergy status to other drugs, medicaments and biological substances status: Secondary | ICD-10-CM | POA: Diagnosis not present

## 2023-06-26 ENCOUNTER — Encounter (HOSPITAL_COMMUNITY): Payer: Self-pay

## 2023-06-27 ENCOUNTER — Inpatient Hospital Stay (HOSPITAL_COMMUNITY)

## 2023-06-27 ENCOUNTER — Inpatient Hospital Stay (HOSPITAL_COMMUNITY)
Admission: EM | Admit: 2023-06-27 | Discharge: 2023-07-02 | DRG: 280 | Disposition: A | Discharge status: Hospice | Source: Other Acute Inpatient Hospital | Attending: Internal Medicine | Admitting: Internal Medicine

## 2023-06-27 DIAGNOSIS — I509 Heart failure, unspecified: Principal | ICD-10-CM

## 2023-06-27 DIAGNOSIS — Z7722 Contact with and (suspected) exposure to environmental tobacco smoke (acute) (chronic): Secondary | ICD-10-CM | POA: Diagnosis present

## 2023-06-27 DIAGNOSIS — I2510 Atherosclerotic heart disease of native coronary artery without angina pectoris: Secondary | ICD-10-CM | POA: Diagnosis present

## 2023-06-27 DIAGNOSIS — Z79899 Other long term (current) drug therapy: Secondary | ICD-10-CM

## 2023-06-27 DIAGNOSIS — E871 Hypo-osmolality and hyponatremia: Secondary | ICD-10-CM | POA: Diagnosis present

## 2023-06-27 DIAGNOSIS — I132 Hypertensive heart and chronic kidney disease with heart failure and with stage 5 chronic kidney disease, or end stage renal disease: Principal | ICD-10-CM | POA: Diagnosis present

## 2023-06-27 DIAGNOSIS — I071 Rheumatic tricuspid insufficiency: Secondary | ICD-10-CM | POA: Diagnosis present

## 2023-06-27 DIAGNOSIS — R030 Elevated blood-pressure reading, without diagnosis of hypertension: Secondary | ICD-10-CM | POA: Diagnosis not present

## 2023-06-27 DIAGNOSIS — M4854XA Collapsed vertebra, not elsewhere classified, thoracic region, initial encounter for fracture: Secondary | ICD-10-CM | POA: Diagnosis present

## 2023-06-27 DIAGNOSIS — E1122 Type 2 diabetes mellitus with diabetic chronic kidney disease: Secondary | ICD-10-CM | POA: Diagnosis present

## 2023-06-27 DIAGNOSIS — E119 Type 2 diabetes mellitus without complications: Secondary | ICD-10-CM

## 2023-06-27 DIAGNOSIS — N186 End stage renal disease: Secondary | ICD-10-CM | POA: Diagnosis present

## 2023-06-27 DIAGNOSIS — R001 Bradycardia, unspecified: Secondary | ICD-10-CM | POA: Diagnosis not present

## 2023-06-27 DIAGNOSIS — J9601 Acute respiratory failure with hypoxia: Secondary | ICD-10-CM | POA: Diagnosis present

## 2023-06-27 DIAGNOSIS — K5900 Constipation, unspecified: Secondary | ICD-10-CM | POA: Diagnosis not present

## 2023-06-27 DIAGNOSIS — I452 Bifascicular block: Secondary | ICD-10-CM | POA: Diagnosis present

## 2023-06-27 DIAGNOSIS — I10 Essential (primary) hypertension: Secondary | ICD-10-CM

## 2023-06-27 DIAGNOSIS — Z853 Personal history of malignant neoplasm of breast: Secondary | ICD-10-CM

## 2023-06-27 DIAGNOSIS — Z85828 Personal history of other malignant neoplasm of skin: Secondary | ICD-10-CM | POA: Diagnosis not present

## 2023-06-27 DIAGNOSIS — D631 Anemia in chronic kidney disease: Secondary | ICD-10-CM | POA: Diagnosis not present

## 2023-06-27 DIAGNOSIS — E2089 Other specified hypoparathyroidism: Secondary | ICD-10-CM | POA: Diagnosis present

## 2023-06-27 DIAGNOSIS — S0990XA Unspecified injury of head, initial encounter: Secondary | ICD-10-CM | POA: Diagnosis not present

## 2023-06-27 DIAGNOSIS — I272 Pulmonary hypertension, unspecified: Secondary | ICD-10-CM | POA: Diagnosis present

## 2023-06-27 DIAGNOSIS — Z8249 Family history of ischemic heart disease and other diseases of the circulatory system: Secondary | ICD-10-CM | POA: Diagnosis not present

## 2023-06-27 DIAGNOSIS — Z7901 Long term (current) use of anticoagulants: Secondary | ICD-10-CM

## 2023-06-27 DIAGNOSIS — D649 Anemia, unspecified: Secondary | ICD-10-CM | POA: Diagnosis not present

## 2023-06-27 DIAGNOSIS — Z66 Do not resuscitate: Secondary | ICD-10-CM | POA: Diagnosis present

## 2023-06-27 DIAGNOSIS — G8929 Other chronic pain: Secondary | ICD-10-CM | POA: Diagnosis present

## 2023-06-27 DIAGNOSIS — I214 Non-ST elevation (NSTEMI) myocardial infarction: Secondary | ICD-10-CM | POA: Diagnosis not present

## 2023-06-27 DIAGNOSIS — D509 Iron deficiency anemia, unspecified: Secondary | ICD-10-CM | POA: Diagnosis present

## 2023-06-27 DIAGNOSIS — Z515 Encounter for palliative care: Secondary | ICD-10-CM

## 2023-06-27 DIAGNOSIS — N184 Chronic kidney disease, stage 4 (severe): Secondary | ICD-10-CM | POA: Diagnosis not present

## 2023-06-27 DIAGNOSIS — R54 Age-related physical debility: Secondary | ICD-10-CM | POA: Diagnosis present

## 2023-06-27 DIAGNOSIS — N179 Acute kidney failure, unspecified: Secondary | ICD-10-CM | POA: Diagnosis present

## 2023-06-27 DIAGNOSIS — I5021 Acute systolic (congestive) heart failure: Secondary | ICD-10-CM | POA: Diagnosis not present

## 2023-06-27 DIAGNOSIS — F419 Anxiety disorder, unspecified: Secondary | ICD-10-CM | POA: Diagnosis present

## 2023-06-27 DIAGNOSIS — R531 Weakness: Secondary | ICD-10-CM | POA: Diagnosis not present

## 2023-06-27 DIAGNOSIS — I700 Atherosclerosis of aorta: Secondary | ICD-10-CM | POA: Diagnosis not present

## 2023-06-27 DIAGNOSIS — J90 Pleural effusion, not elsewhere classified: Secondary | ICD-10-CM | POA: Diagnosis not present

## 2023-06-27 DIAGNOSIS — Z7982 Long term (current) use of aspirin: Secondary | ICD-10-CM

## 2023-06-27 DIAGNOSIS — Z888 Allergy status to other drugs, medicaments and biological substances status: Secondary | ICD-10-CM

## 2023-06-27 DIAGNOSIS — R0689 Other abnormalities of breathing: Secondary | ICD-10-CM | POA: Diagnosis not present

## 2023-06-27 DIAGNOSIS — I252 Old myocardial infarction: Secondary | ICD-10-CM

## 2023-06-27 DIAGNOSIS — I2583 Coronary atherosclerosis due to lipid rich plaque: Secondary | ICD-10-CM | POA: Diagnosis not present

## 2023-06-27 DIAGNOSIS — Z823 Family history of stroke: Secondary | ICD-10-CM

## 2023-06-27 DIAGNOSIS — I6200 Nontraumatic subdural hemorrhage, unspecified: Secondary | ICD-10-CM | POA: Diagnosis not present

## 2023-06-27 DIAGNOSIS — I6782 Cerebral ischemia: Secondary | ICD-10-CM | POA: Diagnosis not present

## 2023-06-27 DIAGNOSIS — W19XXXA Unspecified fall, initial encounter: Secondary | ICD-10-CM | POA: Diagnosis not present

## 2023-06-27 DIAGNOSIS — J811 Chronic pulmonary edema: Secondary | ICD-10-CM | POA: Diagnosis not present

## 2023-06-27 DIAGNOSIS — E782 Mixed hyperlipidemia: Secondary | ICD-10-CM | POA: Diagnosis present

## 2023-06-27 DIAGNOSIS — R0602 Shortness of breath: Secondary | ICD-10-CM | POA: Diagnosis not present

## 2023-06-27 DIAGNOSIS — D72829 Elevated white blood cell count, unspecified: Secondary | ICD-10-CM | POA: Diagnosis not present

## 2023-06-27 DIAGNOSIS — K219 Gastro-esophageal reflux disease without esophagitis: Secondary | ICD-10-CM | POA: Diagnosis present

## 2023-06-27 DIAGNOSIS — I959 Hypotension, unspecified: Secondary | ICD-10-CM | POA: Diagnosis not present

## 2023-06-27 DIAGNOSIS — Z9011 Acquired absence of right breast and nipple: Secondary | ICD-10-CM

## 2023-06-27 DIAGNOSIS — E875 Hyperkalemia: Secondary | ICD-10-CM | POA: Diagnosis present

## 2023-06-27 DIAGNOSIS — S065X0S Traumatic subdural hemorrhage without loss of consciousness, sequela: Secondary | ICD-10-CM

## 2023-06-27 DIAGNOSIS — R4589 Other symptoms and signs involving emotional state: Secondary | ICD-10-CM | POA: Diagnosis not present

## 2023-06-27 DIAGNOSIS — I480 Paroxysmal atrial fibrillation: Secondary | ICD-10-CM | POA: Diagnosis present

## 2023-06-27 DIAGNOSIS — Z7401 Bed confinement status: Secondary | ICD-10-CM | POA: Diagnosis not present

## 2023-06-27 DIAGNOSIS — Z955 Presence of coronary angioplasty implant and graft: Secondary | ICD-10-CM

## 2023-06-27 DIAGNOSIS — I5023 Acute on chronic systolic (congestive) heart failure: Secondary | ICD-10-CM | POA: Diagnosis present

## 2023-06-27 DIAGNOSIS — Z882 Allergy status to sulfonamides status: Secondary | ICD-10-CM

## 2023-06-27 DIAGNOSIS — S7291XA Unspecified fracture of right femur, initial encounter for closed fracture: Secondary | ICD-10-CM | POA: Diagnosis not present

## 2023-06-27 DIAGNOSIS — R918 Other nonspecific abnormal finding of lung field: Secondary | ICD-10-CM | POA: Diagnosis not present

## 2023-06-27 DIAGNOSIS — Z7189 Other specified counseling: Secondary | ICD-10-CM | POA: Diagnosis not present

## 2023-06-27 DIAGNOSIS — Z881 Allergy status to other antibiotic agents status: Secondary | ICD-10-CM

## 2023-06-27 LAB — BASIC METABOLIC PANEL WITH GFR
Anion gap: 11 (ref 5–15)
BUN: 38 mg/dL — ABNORMAL HIGH (ref 8–23)
CO2: 19 mmol/L — ABNORMAL LOW (ref 22–32)
Calcium: 8.6 mg/dL — ABNORMAL LOW (ref 8.9–10.3)
Chloride: 103 mmol/L (ref 98–111)
Creatinine, Ser: 2.74 mg/dL — ABNORMAL HIGH (ref 0.44–1.00)
GFR, Estimated: 16 mL/min — ABNORMAL LOW (ref >60)
Glucose, Bld: 135 mg/dL — ABNORMAL HIGH (ref 70–99)
Potassium: 5.2 mmol/L — ABNORMAL HIGH (ref 3.5–5.1)
Sodium: 133 mmol/L — ABNORMAL LOW (ref 135–145)

## 2023-06-27 LAB — CBC WITH DIFFERENTIAL/PLATELET
Abs Immature Granulocytes: 0.25 10*3/uL — ABNORMAL HIGH (ref 0.00–0.07)
Basophils Absolute: 0.1 10*3/uL (ref 0.0–0.1)
Basophils Relative: 0 %
Eosinophils Absolute: 0.2 10*3/uL (ref 0.0–0.5)
Eosinophils Relative: 1 %
HCT: 28 % — ABNORMAL LOW (ref 36.0–46.0)
Hemoglobin: 8.6 g/dL — ABNORMAL LOW (ref 12.0–15.0)
Immature Granulocytes: 2 %
Lymphocytes Relative: 8 %
Lymphs Abs: 1.1 10*3/uL (ref 0.7–4.0)
MCH: 28.7 pg (ref 26.0–34.0)
MCHC: 30.7 g/dL (ref 30.0–36.0)
MCV: 93.3 fL (ref 80.0–100.0)
Monocytes Absolute: 1.7 10*3/uL — ABNORMAL HIGH (ref 0.1–1.0)
Monocytes Relative: 13 %
Neutro Abs: 10.5 10*3/uL — ABNORMAL HIGH (ref 1.7–7.7)
Neutrophils Relative %: 76 %
Platelets: 452 10*3/uL — ABNORMAL HIGH (ref 150–400)
RBC: 3 MIL/uL — ABNORMAL LOW (ref 3.87–5.11)
RDW: 17.4 % — ABNORMAL HIGH (ref 11.5–15.5)
WBC: 13.7 10*3/uL — ABNORMAL HIGH (ref 4.0–10.5)
nRBC: 0 % (ref 0.0–0.2)

## 2023-06-27 LAB — GLUCOSE, CAPILLARY: Glucose-Capillary: 146 mg/dL — ABNORMAL HIGH (ref 70–99)

## 2023-06-27 LAB — BRAIN NATRIURETIC PEPTIDE: B Natriuretic Peptide: >4500 pg/mL — ABNORMAL HIGH (ref 0.0–100.0)

## 2023-06-27 LAB — TROPONIN I (HIGH SENSITIVITY)
Troponin I (High Sensitivity): 1828 ng/L (ref <18)
Troponin I (High Sensitivity): 1613 ng/L (ref <18)

## 2023-06-27 MED ORDER — OXYCODONE-ACETAMINOPHEN 5-325 MG PO TABS
1.0000 | ORAL_TABLET | Freq: Four times a day (QID) | ORAL | Status: DC | PRN
  Administered 2023-06-27 – 2023-06-28 (×2): 1 via ORAL
  Filled 2023-06-27 (×3): qty 1

## 2023-06-27 MED ORDER — NITROGLYCERIN 0.4 MG SL SUBL: 0.4000 mg | SUBLINGUAL_TABLET | SUBLINGUAL | Status: DC | PRN

## 2023-06-27 MED ORDER — AMIODARONE HCL 100 MG PO TABS
50.0000 mg | ORAL_TABLET | Freq: Every day | ORAL | Status: DC
  Administered 2023-06-27 – 2023-07-02 (×6): 50 mg via ORAL
  Filled 2023-06-27 (×6): qty 1

## 2023-06-27 MED ORDER — ONDANSETRON HCL 4 MG/2ML IJ SOLN
4.0000 mg | Freq: Four times a day (QID) | INTRAMUSCULAR | Status: DC | PRN
  Administered 2023-07-01: 4 mg via INTRAVENOUS
  Filled 2023-06-27 (×2): qty 2

## 2023-06-27 MED ORDER — ASPIRIN 81 MG PO TBEC
81.0000 mg | DELAYED_RELEASE_TABLET | Freq: Every day | ORAL | Status: DC
  Administered 2023-06-28 – 2023-07-02 (×5): 81 mg via ORAL
  Filled 2023-06-27 (×5): qty 1

## 2023-06-27 MED ORDER — ACETAMINOPHEN 325 MG PO TABS: 650.0000 mg | ORAL_TABLET | ORAL | Status: DC | PRN

## 2023-06-27 NOTE — H&P (Signed)
 History and Physical    Patient: Crystal [name]:[id number] DOB: 1934/03/01 DOA: 06/27/2023 DOS: the patient was seen and examined on 06/27/2023 PCP: Orlena Bitters, MD  Patient coming from: [facility]  Chief Complaint: Dyspnea with hypoxia in 89 yr old with troponins trending up to 5K and a recend subddural hematoma.  HPI: Crystal [name] is a 89 y.o. female with medical history significant of paroxysmal AF, CKD IV (baseline creatinine 2.5 - 3.0), HTN, Hyperlipidemia, Iron deficiency anemia, CAD, secondary hypoparathyroidism, metabolic acidosis related to end stage renal disease.    On 06/05/2023 the patient presented to [facility] with complaints of low back pain. She was found to have a mild compression fracture of T12 that was not amenable to treatment. On 06/09/2023 the patient fell again, hitting her head. The patient was on eliquis  for atrial fibrillation. She is also complaining of bilateral leg pain. She required 3L in the ED there. The patient was found to have a subdural hematoma, and a pneumonia of the right lower lobe with resultant hypoxia. On 06/23/2023 she again presented to [facility] with complaints of HR in the 30's while working with PT. She felt weak and SOB with an ocassional cough. Her creatinine and potassium were 6. Her coreg  was held and her heart rate recovered on its own. She did not present with a clinical picture consistent with pneumonia at this time.  She was admitted with bradycardia, AKI, hyponatremia (127), hyperkalemia, and CKD IV, Weakness, leukocytosis, recent SDH and femur fracture. She was requiring 3L O2 to maintain saturations in the 90's. Cardiology was consulted. They endorsed avoidance of beta blockers, contiuation of amlodipine  and hydralazine .  On 06/26/2023 the patient became short of breath and apparently had a CXR that was consistent with CHF. EKG demonstrated Wide QRS with PVC's vs fusion complexes, a right bundle branch block.  BNP was 34,453. Troponins increased from 123 at 10:30 am on 06/26/2023, to 2775 at 1:05 pm on 06/26/2023 to 4436 at 4:48 pm on 06/26/2023.   They were attempting to transfer the patient to [facility], but there were no beds available. She was accepted in transfer to [facility] on 06/27/2023 by Dr. Brice Campi. Cardiology on-call was to have been consulted by [facility] physician for acceptance.  The patient arrived at 3E 01 at 16:57  I have discussed the patient with the cardiology fellow. Stat EKG, Echocardiogram, Troponins have been ordered along with CXR, CBC, BMP.  Family is at bedside.  I have discussed code status with the patient. Although she has previously been a DNR, at this time she expresses uncertainty and wishes to remain a Full Code for now.   Review of Systems: unable to review all systems due to the inability of the patient to answer questions. Past Medical History:  Diagnosis Date   Anemia    Bell's palsy    Breast cancer (HCC) 1998   Right mastectomy   CAD (coronary artery disease)    a. s/p STEMI in 01/2019 with DES to LAD b. s/p NSTEMI in 12/2019 with DES to RCA c. cath in 01/2020 showing patent stents   CHF (congestive heart failure) (HCC)    a. EF 30-35% in 01/2019 b. 40-45% in 01/2020 c. EF at 65-70% by echo in 05/2020   Chronic kidney disease    Essential hypertension    GERD (gastroesophageal reflux disease)    Gout    History of skin cancer    Squamous cell, left shoulder  Mixed hyperlipidemia    NSTEMI (non-ST elevated myocardial infarction) (HCC)    12/2019   Osteopenia    Paroxysmal atrial fibrillation (HCC)    Thyroid  nodule    Type 2 diabetes mellitus [facility])    Past Surgical History:  Procedure Laterality Date   APPENDECTOMY     BIOPSY  10/13/2019   Procedure: BIOPSY;  Surgeon: Suzette Espy, MD;  Location: [facility]O [facility];  Service: Endoscopy;;   CATARACT EXTRACTION  2016   COLONOSCOPY  2019   Dr Perri Brake   CORONARY ANGIOGRAPHY N/A 01/16/2020   Procedure:  CORONARY ANGIOGRAPHY;  Surgeon: Lucendia Rusk, MD;  Location: [facility] INVASIVE CV LAB;  Service: Cardiovascular;  Laterality: N/A;   CORONARY STENT INTERVENTION N/A 01/16/2020   Procedure: CORONARY STENT INTERVENTION;  Surgeon: Lucendia Rusk, MD;  Location: [facility] INVASIVE CV LAB;  Service: Cardiovascular;  Laterality: N/A;   CORONARY ULTRASOUND/IVUS N/A 01/16/2020   Procedure: Intravascular Ultrasound/IVUS;  Surgeon: Lucendia Rusk, MD;  Location: [facility] INVASIVE CV LAB;  Service: Cardiovascular;  Laterality: N/A;   CORONARY/GRAFT ACUTE MI REVASCULARIZATION N/A 01/30/2019   Procedure: Coronary/Graft Acute MI Revascularization;  Surgeon: Odie Benne, MD;  Location: [facility] INVASIVE CV LAB;  Service: Cardiovascular;  Laterality: N/A;   ESOPHAGOGASTRODUODENOSCOPY (EGD) WITH PROPOFOL  N/A 10/13/2019   Non-obstructing Schatzki ring at GE junction, s/p dilation, erosive gastropathy with stigmata of recent bleeding, normal duodenum. Negative H.pylori.    HYSTEROSCOPY     LAPAROSCOPIC APPENDECTOMY N/A 08/03/2020   Procedure: APPENDECTOMY LAPAROSCOPIC;  Surgeon: Flynn Hylan, MD;  Location: [facility] ORS;  Service: General;  Laterality: N/A;   LEFT HEART CATH AND CORONARY ANGIOGRAPHY N/A 01/30/2019   Procedure: LEFT HEART CATH AND CORONARY ANGIOGRAPHY;  Surgeon: Odie Benne, MD;  Location: [facility] INVASIVE CV LAB;  Service: Cardiovascular;  Laterality: N/A;   LEFT HEART CATH AND CORONARY ANGIOGRAPHY N/A 01/16/2020   Procedure: LEFT HEART CATH AND CORONARY ANGIOGRAPHY;  Surgeon: Lucendia Rusk, MD;  Location: [facility] INVASIVE CV LAB;  Service: Cardiovascular;  Laterality: N/A;   MALONEY DILATION N/A 10/13/2019   Procedure: Londa Rival DILATION;  Surgeon: Suzette Espy, MD;  Location: [facility];  Service: Endoscopy;  Laterality: N/A;   Right mastectomy  1998   [place]   RIGHT/LEFT HEART CATH AND CORONARY ANGIOGRAPHY N/A 02/13/2020   Procedure: RIGHT/LEFT HEART CATH AND CORONARY  ANGIOGRAPHY;  Surgeon: Swaziland, Peter M, MD;  Location: [facility] INVASIVE CV LAB;  Service: Cardiovascular;  Laterality: N/A;   Social History:  reports that she has never smoked. She has been exposed to tobacco smoke. She has never used smokeless tobacco. She reports that she does not drink alcohol  and does not use drugs.  Allergies  Allergen Reactions   Bactrim [Sulfamethoxazole-Trimethoprim] Nausea And Vomiting   Sulfa Antibiotics Nausea And Vomiting   Clonidine Derivatives Other (See Comments)    FATIGUE   Evista [Raloxifene Hcl] Other (See Comments)    GERD   Fosamax [Alendronate Sodium] Other (See Comments)    INCREASED GERD    Glipizide Other (See Comments)    PALPITATIONS   Lipitor [Atorvastatin Calcium ] Other (See Comments)    ACHING   Losartan  Other (See Comments)    FATIGUE    Pravastatin Other (See Comments)    ACHING   Azithromycin Rash and Other (See Comments)    FACIAL BURNING     Family History  Problem Relation Age of Onset   Stroke Mother    Heart attack Father    Aneurysm Sister  Heart attack Maternal Uncle    Heart disease Maternal Uncle    Heart attack Paternal Aunt    Heart disease Paternal Aunt    Heart attack Paternal Grandfather    Heart disease Paternal Grandfather    Heart attack Paternal Aunt    Heart disease Paternal Aunt    Heart attack Maternal Uncle    Heart disease Maternal Uncle    Heart attack Maternal Uncle    Heart disease Maternal Uncle    Heart attack Sister    Heart disease Sister    Cancer Sister    Colon cancer Neg Hx    Colon polyps Neg Hx     Prior to Admission medications   Medication Sig Start Date End Date Taking? Authorizing Provider  acetaminophen  (TYLENOL ) 500 MG tablet Take 500 mg by mouth every 6 (six) hours as needed for headache (pain).   Yes [provider]  amiodarone  (PACERONE ) 200 MG tablet Take 0.5 tablets (100 mg total) by mouth daily. 10/07/22  Yes Gerard Knight, MD  amLODipine  (NORVASC ) 5 MG  tablet Take 5 mg by mouth daily.   Yes [provider]  CALCIUM  CARB-CHOLECALCIFEROL  PO Take 1 tablet by mouth daily.   Yes [provider]  cholecalciferol  (VITAMIN D3) 25 MCG (1000 UT) tablet Take 1,000 Units by mouth daily with breakfast.   Yes [provider]  diazepam  (VALIUM ) 2 MG tablet Take 2 mg by mouth 2 (two) times daily as needed for anxiety.   Yes [provider]  docusate sodium  (COLACE) 100 MG capsule Take 100 mg by mouth daily as needed for mild constipation.   Yes [provider]  ELIQUIS  2.5 MG TABS tablet TAKE 1 TABLET BY MOUTH TWICE DAILY 04/20/23  Yes Gerard Knight, MD  ferrous sulfate  325 (65 FE) MG tablet Take 325 mg by mouth daily before lunch.   Yes [provider]  hydrALAZINE  (APRESOLINE ) 100 MG tablet Take 1 tablet (100 mg total) by mouth in the morning, at noon, and at bedtime. 08/19/22 08/19/23 Yes Gerard Knight, MD  isosorbide  mononitrate (IMDUR ) 120 MG 24 hr tablet TAKE 1 TABLET BY MOUTH AT BEDTIME 05/04/23  Yes Gerard Knight, MD  loperamide  (IMODIUM ) 2 MG capsule Take 2 mg by mouth as needed for diarrhea or loose stools.   Yes [provider]  meclizine  (ANTIVERT ) 25 MG tablet Take 25 mg by mouth as needed for dizziness.   Yes [provider]  Multiple Vitamin (MULTIVITAMIN WITH MINERALS) TABS tablet Take 1 tablet by mouth daily. Centrum Silver for Women   Yes [provider]  multivitamin-lutein  (OCUVITE-LUTEIN ) CAPS capsule Take 1 capsule by mouth daily before lunch.    Yes [provider]  nitroGLYCERIN  (NITROSTAT ) 0.4 MG SL tablet PLACE 1 TAB UNDER TONGUE EVERY 3 MINUTES AS DIRECTED UP TO 3 TIMES AS NEEDED FOR CHEST PAIN. 01/07/21  Yes Sanjuanita Cruz, NP  oxyCODONE -acetaminophen  (PERCOCET/ROXICET) 5-325 MG tablet Take 1 tablet by mouth every 6 (six) hours as needed for severe pain (pain score 7-10). 06/06/23  Yes Cheyenne Cotta, MD  Simethicone  (GAS-X PO) Take 1  capsule by mouth daily as needed (gas).   Yes [provider]  sodium chloride  (OCEAN) 0.65 % SOLN nasal spray Place 1 spray into both nostrils as needed for congestion.   Yes [provider]  spironolactone  (ALDACTONE ) 25 MG tablet Take 25 mg by mouth daily.   Yes [provider]  torsemide  (DEMADEX ) 20 MG tablet  Take 10-20 mg by mouth daily. Take 20 mg on M-W-F & 10 mg all other days   Yes [provider]  carvedilol  (COREG ) 6.25 MG tablet TAKE 1 TABLET BY MOUTH TWICE DAILY Patient not taking: Reported on 06/27/2023 04/27/23   Gerard Knight, MD    Physical Exam: Vitals:   06/27/23 1648  BP: (!) 131/57  Resp: (!) 30  Temp: 97.8 F (36.6 C)  TempSrc: Oral  SpO2: 94%   Exam:  Constitutional:  The patient is awake, alert, and oriented x 3. No acute distress. Eyes:  pupils and irises appear normal Normal lids and conjunctivae ENMT:  grossly normal hearing  Lips appear normal external ears, nose appear normal Oropharynx: mucosa, tongue,posterior pharynx appear normal Neck:  neck appears normal, no masses, normal ROM, supple no thyromegaly Respiratory:  The patient is tachypneic. No wheezes or rhonchi Positive for rales No tactile fremitus Cardiovascular:  Regular rate and rhythm No murmurs, ectopy, or gallups. There is a prominent S2 No lateral PMI. No thrills. Abdomen:  Abdomen is soft, non-tender, non-distended No hernias, masses, or organomegaly Normoactive bowel sounds.  Musculoskeletal:  No cyanosis, clubbing, or edema Skin:  No rashes, lesions, ulcers palpation of skin: no induration or nodules Multiple ecchymoses on patient's forearms bilaterally with some skin tears. Neurologic:  CN 2-12 intact Sensation all 4 extremities intact Psychiatric:  Mental status Mood, affect appropriate Orientation to person, place, time  judgment and insight appear intact  Data Reviewed:  There are no new results to review at this  time.  Assessment and Plan: No notes have been filed under this hospital service. Service: Hospitalist     Advance Care Planning: Code Status: Full Code: I have discussed code status with the patient. Although she has previously been a DNR, at this time she expresses uncertainty and wishes to remain a Full Code for now.  Consults: Cardiology  Family Communication: Family is at bedside. All questions answered to the best of my ability.  Severity of Illness: Severity of Illness: The appropriate patient status for this patient is INPATIENT. Inpatient status is judged to be reasonable and necessary in order to provide the required intensity of service to ensure the patient's safety. The patient's presenting symptoms, physical exam findings, and initial radiographic and laboratory data in the context of their chronic comorbidities is felt to place them at high risk for further clinical deterioration. Furthermore, it is not anticipated that the patient will be medically stable for discharge from the hospital within 2 midnights of admission.   * I certify that at the point of admission it is my clinical judgment that the patient will require inpatient hospital care spanning beyond 2 midnights from the point of admission due to high intensity of service, high risk for further deterioration and high frequency of surveillance required.*  Author: Junita Oliva, DO 06/27/2023 6:49 PM  For on call review [email or web address].

## 2023-06-27 NOTE — Consult Note (Signed)
 Cardiology Consultation:   Patient ID: Crystal [name] MRN: [id number]; DOB: 1934/01/30  Admit date: 06/27/2023 Date of Consult: 06/27/2023  Primary Care Provider: Orlena Bitters, MD [facility] Cardiologist: Teddie Favre, MD  [facility] Electrophysiologist:  None   Patient Profile:   Crystal [name] is a 89 y.o. female with CAD s/p DES to LAD (2021), DES RCA (12/2019), patent stents on coronary angiography (01/2020), paroxysmal AF, 2' hypoparathyroidism, HLD, HTN and CKD4 who is being seen today for the evaluation of NSTEMI at the request of Dr. Dixie Frederickson.  History of Present Illness:   She presented to [facility] on 06/05/23 for complaints of low back pain and was found to have a compression fracture of T12 that was not amenable to treatment.  She fell again on 05 13 was again evaluated in the emergency department.  She was found to have a subdural hematoma in the context of Eliquis  for A-fib with a traumatic fall.  There was also concern for RLL PNA with associated hypoxia. On 06/23/23 she again presented to [facility] with concern for bradycardia while working with PT. at that time she was on Coreg  which was discontinued.  She had worsening SOB and presented on 06/26/23 to the ED with CXR that was c/w CHF. ECG with RBBB but no ischemic changes. BNP was significantly elevated (34,453) and with significant hsT elevation/delta (123 on 05/30 at 100->2775 at 1305 on 05/30->4436 at 1648 on 0530).  During my evaluation she was resting in bed with shortness of breath on 10 L O2 (sPO2 97%). On 06/26/23 she had acute onset SOB at rest without any other associated chest sx. This has been fairly persistent over the past 24 hours although mildly improved today.  She does not use any supplemental oxygen  at baseline.  With her prior ACS presentation she has always had chest or shoulder pain but has not had associated shortness of breath.  Past Medical History:  Diagnosis Date    Anemia    Bell's palsy    Breast cancer (HCC) 1998   Right mastectomy   CAD (coronary artery disease)    a. s/p STEMI in 01/2019 with DES to LAD b. s/p NSTEMI in 12/2019 with DES to RCA c. cath in 01/2020 showing patent stents   CHF (congestive heart failure) (HCC)    a. EF 30-35% in 01/2019 b. 40-45% in 01/2020 c. EF at 65-70% by echo in 05/2020   Chronic kidney disease    Essential hypertension    GERD (gastroesophageal reflux disease)    Gout    History of skin cancer    Squamous cell, left shoulder   Mixed hyperlipidemia    NSTEMI (non-ST elevated myocardial infarction) (HCC)    12/2019   Osteopenia    Paroxysmal atrial fibrillation (HCC)    Thyroid  nodule    Type 2 diabetes mellitus (HCC)    Past Surgical History:  Procedure Laterality Date   APPENDECTOMY     BIOPSY  10/13/2019   Procedure: BIOPSY;  Surgeon: Suzette Espy, MD;  Location: [facility];  Service: Endoscopy;;   CATARACT EXTRACTION  2016   COLONOSCOPY  2019   Dr Perri Brake   CORONARY ANGIOGRAPHY N/A 01/16/2020   Procedure: CORONARY ANGIOGRAPHY;  Surgeon: Lucendia Rusk, MD;  Location: [facility] INVASIVE CV LAB;  Service: Cardiovascular;  Laterality: N/A;   CORONARY STENT INTERVENTION N/A 01/16/2020   Procedure: CORONARY STENT INTERVENTION;  Surgeon: Lucendia Rusk, MD;  Location: [facility] INVASIVE  CV LAB;  Service: Cardiovascular;  Laterality: N/A;   CORONARY ULTRASOUND/IVUS N/A 01/16/2020   Procedure: Intravascular Ultrasound/IVUS;  Surgeon: Lucendia Rusk, MD;  Location: [facility] INVASIVE CV LAB;  Service: Cardiovascular;  Laterality: N/A;   CORONARY/GRAFT ACUTE MI REVASCULARIZATION N/A 01/30/2019   Procedure: Coronary/Graft Acute MI Revascularization;  Surgeon: Odie Benne, MD;  Location: [facility] INVASIVE CV LAB;  Service: Cardiovascular;  Laterality: N/A;   ESOPHAGOGASTRODUODENOSCOPY (EGD) WITH PROPOFOL  N/A 10/13/2019   Non-obstructing Schatzki ring at GE junction, s/p dilation, erosive  gastropathy with stigmata of recent bleeding, normal duodenum. Negative H.pylori.    HYSTEROSCOPY     LAPAROSCOPIC APPENDECTOMY N/A 08/03/2020   Procedure: APPENDECTOMY LAPAROSCOPIC;  Surgeon: Flynn Hylan, MD;  Location: [facility] ORS;  Service: General;  Laterality: N/A;   LEFT HEART CATH AND CORONARY ANGIOGRAPHY N/A 01/30/2019   Procedure: LEFT HEART CATH AND CORONARY ANGIOGRAPHY;  Surgeon: Odie Benne, MD;  Location: [facility] INVASIVE CV LAB;  Service: Cardiovascular;  Laterality: N/A;   LEFT HEART CATH AND CORONARY ANGIOGRAPHY N/A 01/16/2020   Procedure: LEFT HEART CATH AND CORONARY ANGIOGRAPHY;  Surgeon: Lucendia Rusk, MD;  Location: [facility] INVASIVE CV LAB;  Service: Cardiovascular;  Laterality: N/A;   MALONEY DILATION N/A 10/13/2019   Procedure: Londa Rival DILATION;  Surgeon: Suzette Espy, MD;  Location: [facility];  Service: Endoscopy;  Laterality: N/A;   Right mastectomy  1998   [place]   RIGHT/LEFT HEART CATH AND CORONARY ANGIOGRAPHY N/A 02/13/2020   Procedure: RIGHT/LEFT HEART CATH AND CORONARY ANGIOGRAPHY;  Surgeon: Swaziland, Peter M, MD;  Location: [facility] INVASIVE CV LAB;  Service: Cardiovascular;  Laterality: N/A;    Home Medications:  Prior to Admission medications   Medication Sig Start Date End Date Taking? Authorizing Provider  acetaminophen  (TYLENOL ) 500 MG tablet Take 500 mg by mouth every 6 (six) hours as needed for headache (pain).   Yes [provider]  amiodarone  (PACERONE ) 200 MG tablet Take 0.5 tablets (100 mg total) by mouth daily. 10/07/22  Yes Gerard Knight, MD  amLODipine  (NORVASC ) 5 MG tablet Take 5 mg by mouth daily.   Yes [provider]  CALCIUM  CARB-CHOLECALCIFEROL  PO Take 1 tablet by mouth daily.   Yes [provider]  cholecalciferol  (VITAMIN D3) 25 MCG (1000 UT) tablet Take 1,000 Units by mouth daily with breakfast.   Yes [provider]  diazepam  (VALIUM ) 2 MG tablet Take 2 mg by mouth 2 (two) times daily as  needed for anxiety.   Yes [provider]  docusate sodium  (COLACE) 100 MG capsule Take 100 mg by mouth daily as needed for mild constipation.   Yes [provider]  ELIQUIS  2.5 MG TABS tablet TAKE 1 TABLET BY MOUTH TWICE DAILY 04/20/23  Yes Gerard Knight, MD  ferrous sulfate  325 (65 FE) MG tablet Take 325 mg by mouth daily before lunch.   Yes [provider]  hydrALAZINE  (APRESOLINE ) 100 MG tablet Take 1 tablet (100 mg total) by mouth in the morning, at noon, and at bedtime. 08/19/22 08/19/23 Yes Gerard Knight, MD  isosorbide  mononitrate (IMDUR ) 120 MG 24 hr tablet TAKE 1 TABLET BY MOUTH AT BEDTIME 05/04/23  Yes Gerard Knight, MD  loperamide  (IMODIUM ) 2 MG capsule Take 2 mg by mouth as needed for diarrhea or loose stools.   Yes [provider]  meclizine  (ANTIVERT ) 25 MG tablet Take 25 mg by mouth as needed for dizziness.   Yes [provider]  Multiple Vitamin (MULTIVITAMIN WITH  MINERALS) TABS tablet Take 1 tablet by mouth daily. Centrum Silver for Women   Yes [provider]  multivitamin-lutein  (OCUVITE-LUTEIN ) CAPS capsule Take 1 capsule by mouth daily before lunch.    Yes [provider]  nitroGLYCERIN  (NITROSTAT ) 0.4 MG SL tablet PLACE 1 TAB UNDER TONGUE EVERY 3 MINUTES AS DIRECTED UP TO 3 TIMES AS NEEDED FOR CHEST PAIN. 01/07/21  Yes Sanjuanita Cruz, NP  oxyCODONE -acetaminophen  (PERCOCET/ROXICET) 5-325 MG tablet Take 1 tablet by mouth every 6 (six) hours as needed for severe pain (pain score 7-10). 06/06/23  Yes Cheyenne Cotta, MD  Simethicone  (GAS-X PO) Take 1 capsule by mouth daily as needed (gas).   Yes [provider]  sodium chloride  (OCEAN) 0.65 % SOLN nasal spray Place 1 spray into both nostrils as needed for congestion.   Yes [provider]  spironolactone  (ALDACTONE ) 25 MG tablet Take 25 mg by mouth daily.   Yes [provider]  torsemide  (DEMADEX ) 20 MG tablet Take 10-20 mg by mouth  daily. Take 20 mg on M-W-F & 10 mg all other days   Yes [provider]  carvedilol  (COREG ) 6.25 MG tablet TAKE 1 TABLET BY MOUTH TWICE DAILY Patient not taking: Reported on 06/27/2023 04/27/23   Gerard Knight, MD   Inpatient Medications: Scheduled Meds:  amiodarone   50 mg Oral Daily   [START ON 06/28/2023] aspirin  EC  81 mg Oral Daily   Continuous Infusions:  PRN Meds: acetaminophen , nitroGLYCERIN , ondansetron  (ZOFRAN ) IV, oxyCODONE -acetaminophen  Allergies:    Allergies  Allergen Reactions   Bactrim [Sulfamethoxazole-Trimethoprim] Nausea And Vomiting   Sulfa Antibiotics Nausea And Vomiting   Clonidine Derivatives Other (See Comments)    FATIGUE   Evista [Raloxifene Hcl] Other (See Comments)    GERD   Fosamax [Alendronate Sodium] Other (See Comments)    INCREASED GERD    Glipizide Other (See Comments)    PALPITATIONS   Lipitor [Atorvastatin Calcium ] Other (See Comments)    ACHING   Losartan  Other (See Comments)    FATIGUE    Pravastatin Other (See Comments)    ACHING   Azithromycin Rash and Other (See Comments)    FACIAL BURNING    Social History:   Social History   Socioeconomic History   Marital status: Divorced    Spouse name: Not on file   Number of children: Not on file   Years of education: Not on file   Highest education level: Not on file  Occupational History   Not on file  Tobacco Use   Smoking status: Never    Passive exposure: Past   Smokeless tobacco: Never  Vaping Use   Vaping status: Never Used  Substance and Sexual Activity   Alcohol  use: Never   Drug use: Never   Sexual activity: Not on file  Other Topics Concern   Not on file  Social History Narrative   Not on file   Social Drivers of Health   Financial Resource Strain: Low Risk  (06/24/2023)   Received from [facility]   Overall Financial Resource Strain (CARDIA)    Difficulty of Paying Living Expenses: Not hard at all  Food Insecurity: No Food Insecurity (06/27/2023)    Hunger Vital Sign    Worried About Running Out of Food in the Last Year: Never true    Ran Out of Food in the Last Year: Never true  Transportation Needs: No Transportation Needs (06/27/2023)   PRAPARE - Administrator, Civil Service (Medical): No  Lack of Transportation (Non-Medical): No  Physical Activity: Insufficiently Active (06/24/2023)   Received from [facility]   Exercise Vital Sign    Days of Exercise per Week: 3 days    Minutes of Exercise per Session: 20 min  Stress: Stress Concern Present (06/24/2023)   Received from [facility] - Occupational Stress Questionnaire    Feeling of Stress : To some extent  Social Connections: Moderately Isolated (06/27/2023)   Social Connection and Isolation Panel [NHANES]    Frequency of Communication with Friends and Family: More than three times a week    Frequency of Social Gatherings with Friends and Family: More than three times a week    Attends Religious Services: 1 to 4 times per year    Active Member of [employer] or Organizations: No    Attends Banker Meetings: Never    Marital Status: Divorced  Catering manager Violence: Not At Risk (06/27/2023)   Humiliation, Afraid, Rape, and Kick questionnaire    Fear of Current or Ex-Partner: No    Emotionally Abused: No    Physically Abused: No    Sexually Abused: No    Family History:    Family History  Problem Relation Age of Onset   Stroke Mother    Heart attack Father    Aneurysm Sister    Heart attack Maternal Uncle    Heart disease Maternal Uncle    Heart attack Paternal Aunt    Heart disease Paternal Aunt    Heart attack Paternal Grandfather    Heart disease Paternal Grandfather    Heart attack Paternal Aunt    Heart disease Paternal Aunt    Heart attack Maternal Uncle    Heart disease Maternal Uncle    Heart attack Maternal Uncle    Heart disease Maternal Uncle    Heart attack Sister    Heart  disease Sister    Cancer Sister    Colon cancer Neg Hx    Colon polyps Neg Hx     ROS:  Review of Systems: [y] = yes, [ ]  = no      General: Weight gain [ ] ; Weight loss [ ] ; Anorexia [ ] ; Fatigue [ ] ; Fever [ ] ; Chills [ ] ; Weakness [ ]    Cardiac: Chest pain/pressure [ ] ; Resting SOB [y]; Exertional SOB [ ] ; Orthopnea [ ] ; Pedal Edema [ ] ; Palpitations [ ] ; Syncope [ ] ; Presyncope [ ] ; Paroxysmal nocturnal dyspnea [ ]    Pulmonary: Cough [ ] ; Wheezing [ ] ; Hemoptysis [ ] ; Sputum [ ] ; Snoring [ ]    GI: Vomiting [ ] ; Dysphagia [ ] ; Melena [ ] ; Hematochezia [ ] ; Heartburn [ ] ; Abdominal pain [ ] ; Constipation [ ] ; Diarrhea [ ] ; BRBPR [ ]    GU: Hematuria [ ] ; Dysuria [ ] ; Nocturia [ ]  Vascular: Pain in legs with walking [ ] ; Pain in feet with lying flat [ ] ; Non-healing sores [ ] ; Stroke [ ] ; TIA [ ] ; Slurred speech [ ] ;   Neuro: Headaches [ ] ; Vertigo [ ] ; Seizures [ ] ; Paresthesias [ ] ;Blurred vision [ ] ; Diplopia [ ] ; Vision changes [ ]    Ortho/Skin: Arthritis [ ] ; Joint pain [ ] ; Muscle pain [ ] ; Joint swelling [ ] ; Back Pain [ ] ; Rash [ ]    Psych: Depression [ ] ; Anxiety [ ]    Heme: Bleeding problems [ ] ; Clotting disorders [ ] ; Anemia [ ]    Endocrine: Diabetes [ ] ;  Thyroid  dysfunction [ ]    Physical Exam/Data:   Vitals:   06/27/23 1648 06/27/23 1949 06/27/23 1952  BP: (!) 131/57  (!) 121/57  Pulse:   78  Resp: (!) 30 18 20   Temp: 97.8 F (36.6 C)  (!) 97.5 F (36.4 C)  TempSrc: Oral  Axillary  SpO2: 94%  97%   No intake or output data in the 24 hours ending 06/27/23 2339    06/06/2023    5:59 AM 02/02/2023    1:36 PM 10/13/2022   10:42 AM  Last 3 Weights  Weight (lbs) 116 lb 120 lb 6.4 oz 129 lb 4.8 oz  Weight (kg) 52.617 kg 54.613 kg 58.65 kg     There is no height or weight on file to calculate BMI.  General:  Well nourished, well developed, moderate SOB at rest  HEENT: normal Lymph: no adenopathy Cardiac:  normal S1, S2; RRR; no murmur  Lungs:  clear to auscultation  bilaterally, no wheezing Ext: no edema Skin: warm and dry Psych:  Normal affect   EKG:  The prior EKG (06/27/23, 13:41:18) was personally reviewed and demonstrates: NSR 63, PR 144, QRS 78, QT/Qtc 442/452, no ischemic changes. The current ECG (06/26/23, 22:05:16) with NSR 87, PR 206, QRS 150, QT/Qtc 410/493, RBBB, 1' AVB Telemetry:  Telemetry was personally reviewed and demonstrates: NSR 80s   Relevant CV Studies:  TTE  Result date: 09/29/21  1. Left ventricular ejection fraction, by estimation, is 60 to 65%. The  left ventricle has normal function. The left ventricle has no regional  wall motion abnormalities. Left ventricular diastolic parameters were  normal.   2. Right ventricular systolic function is normal. The right ventricular  size is normal. There is normal pulmonary artery systolic pressure.   3. The mitral valve is degenerative. Trivial mitral valve regurgitation.  No evidence of mitral stenosis. Moderate mitral annular calcification.   4. The aortic valve is tricuspid. There is mild calcification of the  aortic valve. There is mild thickening of the aortic valve. Aortic valve  regurgitation is mild. Aortic valve sclerosis/calcification is present,  without any evidence of aortic  stenosis.   5. The inferior vena cava is dilated in size with >50% respiratory  variability, suggesting right atrial pressure of 8 mmHg.   Laboratory Data:  TIMI Risk Score for Unstable Angina or Non-ST Elevation MI:   The patient's TIMI risk score is 5, which indicates a 26% risk of all cause mortality, new or recurrent myocardial infarction or need for urgent revascularization in the next 14 days.   Assessment and Plan:  Crystal [name] is a 89 y.o. female with CAD s/p DES to LAD (2021), DES RCA (12/2019), patent stents on coronary angiography (01/2020), paroxysmal AF, 2' hypoparathyroidism, HLD, HTN and CKD4 who is being seen today for the evaluation of NSTEMI.   NSTEMI Crystal [name]  presents with significant troponin elevation over 24 hours along with elevated proBNP and associated shortness of breath at rest. Her symptoms are primary ACS vs acute PE vs ADHF. If ACS or acute PE (either of which could cause her significant hsT elevation) then difficult to manage appropriately in the setting of recent subdural hematoma managed non-operatively. She is just outside of 2 weeks post SDH and off OAC (was on apixaban ). She has CKD4 and is likely not a dialysis candidate. I discussed risk and benefits of contrasted CT scan with her and her daughter however the utility of this right now is questionable since even  low dose heparin  without bolus may lead to re-expansion of the SDH and worsening midline shift. She received ASA yesterday. Even if saddle PE there would need to be heparin  for aspiration thrombectomy or other intervention. I am more concerned right now with acute PE than ACS with her sx and hypoxia. She was on 10 L O2 during my initial evaluation and I was able to wean to 6 L with O2 sats >93%, < 3 L and her O2 dipped to 91% and off O2 she was at 85%. She is not on supplemental O2 at home.  - LVEF preserved 06/10/23 at [facility]  - continue ASA 81 mg daily monotherapy  - hold off on heparin  gtt with recent SDH - CT head non contrast for updated baseline SDH size to better define risk of heparin  and/or interventions, also with recent junctional bradycardia want to assess for any increase in size that would lead to increased ICP, updated-looks around 19 mm, similar to prior for SDH - will give lasix  40 mg IV (although doesn't look significantly volume overloaded) to try and improve oxygenation, this is probably neutral effect for PE and beneficial if ADHF is contributing  - TTE to assess for WMAs and for RV strain   Paroxysmal AF - hold apixaban  with recent SDH - continue OP amio 100 mg daily  - d/c coreg  6.25 mg bid with recent bradycardia   3. Intermittent bifascicular block  - new  onset RBBB, intermittent  - 1' AVB  - asx, monitor for now  HTN - continue OP hydral, imdur , spiro   CKD4 - chronic renal dysfxn, minimizing contrast as above   For questions or updates, please contact  [facility] Please consult [email or web address] for contact info under   Signed, Katrine Parody, MD  06/27/2023 11:39 PM

## 2023-06-27 NOTE — Progress Notes (Addendum)
 [facility] night cross cover note:   I was notified by the patient's RN  of the patient's troponin of 1828. RN conveys that the patient denies any chest pain at this time. Most recent VS appear stable, including AF, HR in the 80's, SBP's in the 130's, and the patient is now maintaining O2 sats in the mid-90's on RA. Per chart review, including review of admission h&p, this troponin appears to be trending down from most recent prior troponin of 5000, when checked earlier today at [facility], noting that the patient was transferred to [facility] earlier today with acute hypoxic respiratory failure, and that cardiology is already following. Will continue with existing plan for further trending of troponin and echo.    Additionally, patient has passed RN swallow screen. Diet updated to regular.      Camelia Cavalier, DO Hospitalist

## 2023-06-27 NOTE — Progress Notes (Signed)
 [facility] night cross cover note:   Per patient request, I have resumed her home prn Percocet for chronic back pain.    Camelia Cavalier, DO Hospitalist

## 2023-06-28 ENCOUNTER — Inpatient Hospital Stay (HOSPITAL_COMMUNITY)

## 2023-06-28 DIAGNOSIS — I10 Essential (primary) hypertension: Secondary | ICD-10-CM | POA: Diagnosis not present

## 2023-06-28 DIAGNOSIS — I480 Paroxysmal atrial fibrillation: Secondary | ICD-10-CM | POA: Diagnosis not present

## 2023-06-28 DIAGNOSIS — I5021 Acute systolic (congestive) heart failure: Secondary | ICD-10-CM

## 2023-06-28 DIAGNOSIS — I5023 Acute on chronic systolic (congestive) heart failure: Secondary | ICD-10-CM | POA: Diagnosis not present

## 2023-06-28 DIAGNOSIS — I2510 Atherosclerotic heart disease of native coronary artery without angina pectoris: Secondary | ICD-10-CM | POA: Diagnosis not present

## 2023-06-28 LAB — BASIC METABOLIC PANEL WITH GFR
Anion gap: 9 (ref 5–15)
BUN: 39 mg/dL — ABNORMAL HIGH (ref 8–23)
CO2: 20 mmol/L — ABNORMAL LOW (ref 22–32)
Calcium: 8.5 mg/dL — ABNORMAL LOW (ref 8.9–10.3)
Chloride: 103 mmol/L (ref 98–111)
Creatinine, Ser: 2.84 mg/dL — ABNORMAL HIGH (ref 0.44–1.00)
GFR, Estimated: 16 mL/min — ABNORMAL LOW (ref >60)
Glucose, Bld: 127 mg/dL — ABNORMAL HIGH (ref 70–99)
Potassium: 5.2 mmol/L — ABNORMAL HIGH (ref 3.5–5.1)
Sodium: 132 mmol/L — ABNORMAL LOW (ref 135–145)

## 2023-06-28 LAB — CBC
HCT: 25.7 % — ABNORMAL LOW (ref 36.0–46.0)
Hemoglobin: 7.8 g/dL — ABNORMAL LOW (ref 12.0–15.0)
MCH: 28.1 pg (ref 26.0–34.0)
MCHC: 30.4 g/dL (ref 30.0–36.0)
MCV: 92.4 fL (ref 80.0–100.0)
Platelets: 469 10*3/uL — ABNORMAL HIGH (ref 150–400)
RBC: 2.78 MIL/uL — ABNORMAL LOW (ref 3.87–5.11)
RDW: 17.2 % — ABNORMAL HIGH (ref 11.5–15.5)
WBC: 12.1 10*3/uL — ABNORMAL HIGH (ref 4.0–10.5)
nRBC: 0 % (ref 0.0–0.2)

## 2023-06-28 LAB — ECHOCARDIOGRAM COMPLETE
Area-P 1/2: 4.68 cm2
Est EF: <20
Height: 65 in
MV VTI: 0.93 cm2
S' Lateral: 4.4 cm
Single Plane A4C EF: 11.2 %
Weight: 2088.2 [oz_av]

## 2023-06-28 LAB — LIPID PANEL
Cholesterol: 139 mg/dL (ref 0–200)
HDL: 45 mg/dL (ref >40)
LDL Cholesterol: 74 mg/dL (ref 0–99)
Total CHOL/HDL Ratio: 3.1 ratio
Triglycerides: 99 mg/dL (ref <150)
VLDL: 20 mg/dL (ref 0–40)

## 2023-06-28 MED ORDER — FUROSEMIDE 10 MG/ML IJ SOLN
40.0000 mg | Freq: Two times a day (BID) | INTRAMUSCULAR | Status: DC
  Administered 2023-06-29 – 2023-06-30 (×3): 40 mg via INTRAVENOUS
  Filled 2023-06-28 (×4): qty 4

## 2023-06-28 MED ORDER — FUROSEMIDE 10 MG/ML IJ SOLN
60.0000 mg | Freq: Two times a day (BID) | INTRAMUSCULAR | Status: DC
  Administered 2023-06-28: 60 mg via INTRAVENOUS
  Filled 2023-06-28: qty 6

## 2023-06-28 MED ORDER — FUROSEMIDE 10 MG/ML IJ SOLN
40.0000 mg | Freq: Once | INTRAMUSCULAR | Status: AC
  Administered 2023-06-28: 40 mg via INTRAVENOUS
  Filled 2023-06-28: qty 4

## 2023-06-28 MED ORDER — OXYCODONE-ACETAMINOPHEN 5-325 MG PO TABS
1.0000 | ORAL_TABLET | Freq: Four times a day (QID) | ORAL | Status: DC | PRN
  Administered 2023-06-28 – 2023-06-30 (×3): 1 via ORAL
  Filled 2023-06-28 (×2): qty 1

## 2023-06-28 NOTE — Assessment & Plan Note (Signed)
 Hyponatremia, hyperkalemia,   Renal function with serum cr at 3.0 with K at 4,9 and serum bicarbonate at 19  Na 133   Very poor prognosis, likely not candidate for renal replacement therapy due to advanced heart failure.  Continue diuresis as tolerated.

## 2023-06-28 NOTE — Assessment & Plan Note (Signed)
 NSTEMI,.   New drop in LV systolic function, no regional wall motion abnormalities.  Today with no chest pain.  Not candidate for anticoagulation due to recent intracranial bleed.  She has a poor physical functional status.  Follow up with cardiology recommendations.

## 2023-06-28 NOTE — Hospital Course (Addendum)
 Crystal [name] was admitted to the hospital with the working diagnosis of acute heart failure decompensation in the setting of NSTEMI.   89 yo female with the past medical history of atrial fibrillation, CKD stage IV, subdural hematoma, hypertension, hyperlipidemia, coronary artery disease and iron deficiency anemia.    05/13 hospitalized with pneumonia and subdural hematoma due to mechanical fall, she was discharged to SNF.   05/27 transferred from SNF to [facility] with diagnosis of bradycardia, weakness, renal failure and hyperkalemia.  Carvedilol  was discontinued with improvement in heart rate, she received IV fluids with improvement in renal function, with a serum cr from 6.1 down to 3.2 05/30 patient had acute respiratory distress, rapid response called, found with volume overload, troponin went up from 120 to 2800. Cardiology was contacted in [facility] for transfer to tertiary care. Unfortunately no beds available, at [facility]. [facility] accepted [facility].   05/31 She was transferred to from [facility] to [facility], her blood pressure was 131/57, RR 30, HR 85, and 02 saturation 94% on room air.  Lungs with no wheezing or rhonchi, heart with S1 and S2 (prominent S2) present and regular with no gallops, rubs or murmurs, abdomen with no distention, no lower extremity edema.   Na 133, K 5.2 CL 103 bicarbonate 19 glucose 135 bun 38 Cr 2.74  BNP >4,500 High sensitive troponin 1,828 and 1,613  Wbc 13.7 hgb 8.6 plt 452   CT head with suspected trace 1-2 mm subacute subdural hematoma overlying the left frontal convexity. No significant mass effect or midline shift.  Generalized age related atrophy with moderate chronic small vessel ischemic disease with a few remote lacunar infarcts about the left frontal centrum ovale and left cerebellum.   Chest radiograph with right rotation, positive cardiomegaly with bilateral hilar vascular congestion, bilateral central interstitial infiltrates and bilateral moderate pleural  effusions, more right than left.   EKG 87 bpm, normal axis, right bundle branch block, qtc 493, sinus rhythm with no significant ST segment or T wave changes.   06/02 patient with worsening dyspnea and persistent volume overload. Poor prognosis.  06/03 patient off bipap, continue with high oxygen  demand, code status changed to DNR. No further escalation of care.

## 2023-06-28 NOTE — IPAL (Addendum)
 I spoke with patient's nice, (POA), Mrs. Gaylyn Keas, I updated her on patient's condition of severe heart failure decompensation, and possible myocardial infarction. Limited therapy due to intracranial bleed.  She understands and all questions were addressed.  She confirms that patient is currently full code.

## 2023-06-28 NOTE — Progress Notes (Signed)
 Patient notified RN of increasing jaw pain (lead to difficulty chewing her meals) that has occurred intermittently for the past two days while at [facility] as well as diffculty swallowing "for a while". Arrien MD notified. SLP eval ordered.

## 2023-06-28 NOTE — Plan of Care (Signed)
   Problem: Clinical Measurements: Goal: Will remain free from infection Outcome: Progressing Goal: Diagnostic test results will improve Outcome: Progressing   Problem: Nutrition: Goal: Adequate nutrition will be maintained Outcome: Progressing

## 2023-06-28 NOTE — Progress Notes (Addendum)
 Progress Note   Patient: KAMAYAH PILLAY UJW:[id number] DOB: July 25, 1934 DOA: 06/27/2023     1 DOS: the patient was seen and examined on 06/28/2023   Brief hospital course: Mrs. Deyarmin was admitted to the hospital with the working diagnosis of acute heart failure decompensation in the setting of NSTEMI.   89 yo female with the past medical history of atrial fibrillation, CKD stage IV, subdural hematoma, hypertension, hyperlipidemia, coronary artery disease and iron deficiency anemia.    05/13 hospitalized with pneumonia and subdural hematoma due to mechanical fall, she was discharged to SNF.   05/27 transferred from SNF to [facility] with diagnosis of bradycardia, weakness, renal failure and hyperkalemia.  Carvedilol  was discontinued with improvement in heart rate, she received IV fluids with improvement in renal function, with a serum cr from 6.1 down to 3.2 05/30 patient had acute respiratory distress, rapid response called, found with volume overload, troponin went up from 120 to 2800. Cardiology was contacted in [facility] for transfer to tertiary care. Unfortunately no beds available, at [facility]. [facility] accepted [facility] for [facility].   05/31 She was transferred to from [facility] to [facility], her blood pressure was 131/57, RR 30, HR 85, and 02 saturation 94% on room air.  Lungs with no wheezing or rhonchi, heart with S1 and S2 (prominent S2) present and regular with no gallops, rubs or murmurs, abdomen with no distention, no lower extremity edema.   Na 133, K 5.2 CL 103 bicarbonate 19 glucose 135 bun 38 Cr 2.74  BNP >4,500 High sensitive troponin 1,828 and 1,613  Wbc 13.7 hgb 8.6 plt 452   CT head with suspected trace 1-2 mm subacute subdural hematoma overlying the left frontal convexity. No significant mass effect or midline shift.  Generalized age related atrophy with moderate chronic small vessel ischemic disease with a few remote lacunar infarcts about the left frontal centrum ovale and left cerebellum.    Chest radiograph with right rotation, positive cardiomegaly with bilateral hilar vascular congestion, bilateral central interstitial infiltrates and bilateral moderate pleural effusions, more right than left.   EKG 87 bpm, normal axis, right bundle branch block, qtc 493, sinus rhythm with no significant ST segment or T wave changes.   Assessment and Plan: * Acute on chronic systolic CHF (congestive heart failure) (HCC) Echocardiogram with new reduction in LV systolic function down to <20%, global hypokinesis, RV systolic function preserved, RVSP 62.6 mmHg, LA with moderate dilatation, RA with mild dilatation, mild mitral stenosis, moderate to severe tricuspid regurgitation. No pericardial effusion,   Acute on chronic core pulmonale  Patient continue volume overloaded. Systolic blood pressure 115 mmHg range.   Plan to continue diuresis with furosemide , will increase dose to 60 mg bid.  Limited pharmacologic options due to risk of hypotension and acutely reduced GFR.   Acute cardiogenic pulmonary edema with bilateral pleural effusions.  Will request right sided thoracentesis.  Continue diuresis and supplemental 02 per [place]  Today 02 saturation is 91% on 2 L/min   CAD (coronary artery disease) NSTEMI,.   New drop in LV systolic function, no regional wall motion abnormalities.  Today with no chest pain.  Not candidate for anticoagulation due to recent intracranial bleed.  She has a poor physical functional status.  Follow up with cardiology recommendations.   Essential hypertension Continue blood pressure monitoring   AF (paroxysmal atrial fibrillation) (HCC) Currently sinus rhythm, continue amiodarone .  No B blocker due to bradycardia  No anticoagulation due to recent intracranial bleed.   CKD (  chronic kidney disease), stage IV (HCC) Hyponatremia, hyperkalemia,   Renal function with serum cr at 2,84 with K at 5.2 and serum bicarbonate at 20  Na 132   Continue diuresis with  furosemide  for volume overload.  Avoid hypotension and nephrotoxic medications.   Type 2 diabetes mellitus (HCC) Continue insulin  sliding scale for glucose cover and monitoring Fasting glucose today 127 mg/dl   Anemia Cell count with hgb at 7,8 and plt at 469  If continue to drop hgb may need transfusion in the setting of acute coronary syndrome.     Subjective: Patient this morning has no chest pain, dyspnea has improved some but not yet back to baseline.   Physical Exam: Vitals:   06/28/23 0820 06/28/23 0924 06/28/23 0926 06/28/23 1150  BP: 135/82 (!) 133/50 (!) 133/54   Pulse: 91 91 90 94  Resp: 20 20 18 19   Temp: 97.8 F (36.6 C)     TempSrc: Oral     SpO2: 92% 92% 92% 91%  Weight:      Height:       Neurology awake and alert, deconditioned and ill looking appearing ENT with mild pallor with no icterus Cardiovascular with S1 and S2 present, positive S3 gallop, with no rubs or murmurs Positive JVD Lower extremity edema trace Respiratory with rales bilaterally with no wheezing, decreased breath sounds at bases Abdomen with no distention  Data Reviewed:    Family Communication: no family at the bedside   Disposition: Status is: Inpatient Remains inpatient appropriate because: IV diuresis   Planned Discharge Destination: Home    Author: Albertus Alt, MD 06/28/2023 1:08 PM  For on call review [email or web address].

## 2023-06-28 NOTE — Progress Notes (Signed)
 Reviewed CT images. L frontal hematoma ~19 mm, most it was measured on prior was 2.3 cm (06/09/23, [facility]) although subsequent ones with smaller measurements.

## 2023-06-28 NOTE — Assessment & Plan Note (Signed)
 Continue blood pressure monitoring

## 2023-06-28 NOTE — Assessment & Plan Note (Addendum)
 Currently sinus rhythm, continue amiodarone .  No B blocker due to bradycardia  No anticoagulation due to recent intracranial bleed.

## 2023-06-28 NOTE — Progress Notes (Signed)
  Echocardiogram 2D Echocardiogram has been performed.   Crystal [name] 06/28/2023, 8:54 AM

## 2023-06-28 NOTE — Progress Notes (Signed)
 Progress Note  Patient Name: Crystal [name] Date of Encounter: 06/28/2023  Primary Cardiologist:   Teddie Favre, MD   Subjective   She says that she is breathing much better.   Now down to 4 liters O2.    Inpatient Medications    Scheduled Meds:  amiodarone   50 mg Oral Daily   aspirin  EC  81 mg Oral Daily   furosemide   60 mg Intravenous Q12H   Continuous Infusions:  PRN Meds: acetaminophen , nitroGLYCERIN , ondansetron  (ZOFRAN ) IV, oxyCODONE -acetaminophen    Vital Signs    Vitals:   06/28/23 0924 06/28/23 0926 06/28/23 1150 06/28/23 1619  BP: (!) 133/50 (!) 133/54  (!) 110/53  Pulse: 91 90 94 75  Resp: 20 18 19 18   Temp:    97.9 F (36.6 C)  TempSrc:    Oral  SpO2: 92% 92% 91% 100%  Weight:      Height:        Intake/Output Summary (Last 24 hours) at 06/28/2023 1623 Last data filed at 06/28/2023 1240 Gross per 24 hour  Intake 417 ml  Output 100 ml  Net 317 ml   Filed Weights   06/28/23 0010  Weight: 59.2 kg    Telemetry    NSR - Personally Reviewed  ECG    NA - Personally Reviewed  Physical Exam   GEN: No acute distress.   Very frail but pleasant Neck: No  JVD Cardiac: RRR, no murmurs, rubs, or gallops.  Respiratory:     Decreased breath sounds right greater than left GI: Soft, nontender, non-distended  MS:    Mild edema; No deformity. Neuro:  Nonfocal  Psych: Normal affect   Labs    Chemistry Recent Labs  Lab 06/27/23 1815 06/28/23 0256  NA 133* 132*  K 5.2* 5.2*  CL 103 103  CO2 19* 20*  GLUCOSE 135* 127*  BUN 38* 39*  CREATININE 2.74* 2.84*  CALCIUM  8.6* 8.5*  GFRNONAA 16* 16*  ANIONGAP 11 9     Hematology Recent Labs  Lab 06/27/23 1815 06/28/23 0256  WBC 13.7* 12.1*  RBC 3.00* 2.78*  HGB 8.6* 7.8*  HCT 28.0* 25.7*  MCV 93.3 92.4  MCH 28.7 28.1  MCHC 30.7 30.4  RDW 17.4* 17.2*  PLT 452* 469*    Cardiac EnzymesNo results for input(s): "TROPONINI" in the last 168 hours. No results for input(s): "TROPIPOC" in  the last 168 hours.   BNP Recent Labs  Lab 06/27/23 1815  BNP >4,500.0*     DDimer No results for input(s): "DDIMER" in the last 168 hours.   Radiology    ECHOCARDIOGRAM COMPLETE Result Date: 06/28/2023    ECHOCARDIOGRAM REPORT   Patient Name:   Crystal [name] Date of Exam: 06/28/2023 Medical Rec #:  [id number]          Height:       65.0 in Accession #:    [id number]         Weight:       130.5 lb Date of Birth:  1934-03-15          BSA:          1.650 m Patient Age:    88 years           BP:           115/47 mmHg Patient Gender: F                  HR:  90 bpm. Exam Location:  Inpatient Procedure: 2D Echo (Both Spectral and Color Flow Doppler were utilized during            procedure). STAT ECHO Indications:    acute systolic chf  History:        Patient has prior history of Echocardiogram examinations, most                 recent 09/29/2021. CHF, CAD; Risk Factors:Diabetes and                 Hypertension.  Sonographer:    Dione Franks RDCS Referring Phys: [id number] AVA SWAYZE IMPRESSIONS  1. Left ventricular ejection fraction, by estimation, is <20%. The left ventricle has severely decreased function. The left ventricle demonstrates global hypokinesis. Left ventricular diastolic parameters are consistent with Grade II diastolic dysfunction (pseudonormalization). Elevated left atrial pressure. The E/e' is 30.  2. Right ventricular systolic function is low normal. The right ventricular size is mildly enlarged. There is moderately elevated pulmonary artery systolic pressure. The estimated right ventricular systolic pressure is 62.6 mmHg.  3. Left atrial size was moderately dilated.  4. Right atrial size was mildly dilated.  5. The mitral valve is degenerative. Mild mitral valve regurgitation. Mild mitral stenosis. Moderate mitral annular calcification.  6. Tricuspid valve regurgitation is moderate to severe.  7. The aortic valve is calcified. Aortic valve regurgitation is trivial. aortic valve  sclerosis.  8. The inferior vena cava is dilated in size with <50% respiratory variability, suggesting right atrial pressure of 15 mmHg. Comparison(s): A prior study was performed on 02/17/2020. LVEF was reported 40-45% and now <20%, see prior report for details. FINDINGS  Left Ventricle: Left ventricular ejection fraction, by estimation, is <20%. The left ventricle has severely decreased function. The left ventricle demonstrates global hypokinesis. The left ventricular internal cavity size was normal in size. There is borderline left ventricular hypertrophy. Left ventricular diastolic parameters are consistent with Grade II diastolic dysfunction (pseudonormalization). Elevated left atrial pressure. The E/e' is 30. Right Ventricle: The right ventricular size is mildly enlarged. Right vetricular wall thickness was not well visualized. Right ventricular systolic function is low normal. There is moderately elevated pulmonary artery systolic pressure. The tricuspid regurgitant velocity is 3.45 m/s, and with an assumed right atrial pressure of 15 mmHg, the estimated right ventricular systolic pressure is 62.6 mmHg. Left [facility]: Left atrial size was moderately dilated. Right [facility]: Right atrial size was mildly dilated. Pericardium: There is no evidence of pericardial effusion. Mitral Valve: The mitral valve is degenerative in appearance. There is mild thickening of the mitral valve leaflet(s). There is moderate calcification of the mitral valve leaflet(s). Moderate mitral annular calcification. Mild mitral valve regurgitation.  Mild mitral valve stenosis. MV peak gradient, 11.7 mmHg. The mean mitral valve gradient is 5.0 mmHg. Tricuspid Valve: The tricuspid valve is normal in structure. Tricuspid valve regurgitation is moderate to severe. No evidence of tricuspid stenosis. Aortic Valve: The aortic valve is calcified. There is mild to moderate aortic valve annular calcification. Aortic valve regurgitation is trivial.  Aortic valve sclerosis. Pulmonic Valve: The pulmonic valve was not well visualized. Pulmonic valve regurgitation is trivial. No evidence of pulmonic stenosis. Aorta: The aortic root and ascending aorta are structurally normal, with no evidence of dilitation. Venous: The inferior vena cava is dilated in size with less than 50% respiratory variability, suggesting right atrial pressure of 15 mmHg. IAS/Shunts: The atrial septum is grossly normal. Additional Comments: There is pleural effusion in the left  lateral region.  LEFT VENTRICLE PLAX 2D LVIDd:         4.80 cm     Diastology LVIDs:         4.40 cm     LV e' medial:    4.90 cm/s LV PW:         0.90 cm     LV E/e' medial:  31.2 LV IVS:        1.10 cm     LV e' lateral:   5.33 cm/s LVOT diam:     1.80 cm     LV E/e' lateral: 28.7 LV SV:         32 LV SV Index:   19 LVOT Area:     2.54 cm  LV Volumes (MOD) LV vol d, MOD A4C: 69.7 ml LV vol s, MOD A4C: 61.9 ml LV SV MOD A4C:     69.7 ml RIGHT VENTRICLE            IVC RV Basal diam:  2.90 cm    IVC diam: 2.40 cm RV S prime:     9.68 cm/s TAPSE (M-mode): 1.9 cm LEFT ATRIUM           Index        RIGHT ATRIUM           Index LA diam:      3.70 cm 2.24 cm/m   RA Area:     19.90 cm LA Vol (A2C): 77.6 ml 47.03 ml/m  RA Volume:   60.80 ml  36.85 ml/m LA Vol (A4C): 69.1 ml 41.88 ml/m  AORTIC VALVE LVOT Vmax:   68.30 cm/s LVOT Vmean:  44.000 cm/s LVOT VTI:    0.126 m  AORTA Ao Root diam: 2.90 cm Ao Asc diam:  2.80 cm MITRAL VALVE                TRICUSPID VALVE MV Area (PHT): 4.68 cm     TR Peak grad:   47.6 mmHg MV Area VTI:   0.93 cm     TR Vmax:        345.00 cm/s MV Peak grad:  11.7 mmHg MV Mean grad:  5.0 mmHg     SHUNTS MV Vmax:       1.71 m/s     Systemic VTI:  0.13 m MV Vmean:      101.0 cm/s   Systemic Diam: 1.80 cm MV Decel Time: 162 msec MV E velocity: 153.00 cm/s MV A velocity: 123.00 cm/s MV E/A ratio:  1.24 Sunit Tolia Electronically signed by Olinda Bertrand Signature Date/Time: 06/28/2023/9:19:29 AM    Final     CT HEAD WO CONTRAST ( ) Result Date: 06/28/2023 CLINICAL DATA:  Initial evaluation for acute NSTEMI, history of recent head trauma with subdural hematoma. EXAM: CT HEAD WITHOUT CONTRAST TECHNIQUE: Contiguous axial images were obtained from the base of the skull through the vertex without intravenous contrast. RADIATION DOSE REDUCTION: This exam was performed according to the departmental dose-optimization program which includes automated exposure control, adjustment of the mA and/or kV according to patient size and/or use of iterative reconstruction technique. COMPARISON:  None Available. FINDINGS: Brain: Generalized age-related cerebral atrophy. Patchy hypodensity involving the supratentorial cerebral white matter, consistent with chronic small vessel ischemic disease, moderately advanced in nature. Few small remote lacunar infarcts about the left frontal centrum semi ovale and left cerebellum. Suspected trace subdural collection overlying the left frontal convexity, measuring no more than 1-2 mm in maximal thickness (  series 5, image 39). This is isodense to adjacent brain, and suspected to reflect a trace residual subacute subdural hematoma. No hyperdense acute blood products. No significant mass effect. No other acute intracranial hemorrhage. No acute large vessel territory infarct. No mass lesion or midline shift. Mild ventricular prominence related to global parenchymal volume loss of hydrocephalus. Vascular: No abnormal hyperdense vessel. Scattered calcified atherosclerosis present at the skull base. Skull: Scalp soft tissues demonstrate no acute finding. Calvarium intact. Sinuses/Orbits: Globes orbital soft tissues within normal limits. Mucosal thickening with small volume pneumatized secretions noted about the sphenoid sinuses. Paranasal sinuses are otherwise largely clear. Trace bilateral mastoid effusions noted, of doubtful significance. Other: None. IMPRESSION: 1. Suspected trace 1-2 mm subacute  subdural hematoma overlying the left frontal convexity as above. No significant mass effect or midline shift. 2. No other acute intracranial abnormality. 3. Generalized age-related cerebral atrophy with moderate chronic small vessel ischemic disease, with a few small remote lacunar infarcts about the left frontal centrum semi ovale and left cerebellum. Electronically Signed   By: Virgia Griffins M.D.   On: 06/28/2023 04:19   Portable chest x-ray 1 view Result Date: 06/27/2023 CLINICAL DATA:  [id number] with acute hypoxic respiratory failure. EXAM: PORTABLE CHEST 1 VIEW COMPARISON:  Portable chest 11/11/2022 FINDINGS: There are moderate to large sized layering pleural effusions. There are overlying opacities of the mid to lower lung fields which could be atelectasis, edema or pneumonia. Cardiomegaly. There is perihilar vascular congestion and radiating opacities consistent with moderate interstitial and alveolar edema. There is aortic atherosclerosis. Stable mediastinum. Right axillary surgical clips. Upper thoracic levoscoliosis and osteopenia noted with degenerative changes. IMPRESSION: 1. Moderate to large sized layering pleural effusions with overlying opacities which could be atelectasis, edema or pneumonia. 2. Cardiomegaly with moderate perihilar radiating interstitial and alveolar edema consistent with CHF or fluid overload. 3. Aortic atherosclerosis. Electronically Signed   By: Denman Fischer M.D.   On: 06/27/2023 20:26    Cardiac Studies   Echo:  1. Left ventricular ejection fraction, by estimation, is <20%. The left  ventricle has severely decreased function. The left ventricle demonstrates  global hypokinesis. Left ventricular diastolic parameters are consistent  with Grade II diastolic  dysfunction (pseudonormalization). Elevated left atrial pressure. The E/e'  is 30.   2. Right ventricular systolic function is low normal. The right  ventricular size is mildly enlarged. There is  moderately elevated  pulmonary artery systolic pressure. The estimated right ventricular  systolic pressure is 62.6 mmHg.   3. Left atrial size was moderately dilated.   4. Right atrial size was mildly dilated.   5. The mitral valve is degenerative. Mild mitral valve regurgitation.  Mild mitral stenosis. Moderate mitral annular calcification.   6. Tricuspid valve regurgitation is moderate to severe.   7. The aortic valve is calcified. Aortic valve regurgitation is trivial.  aortic valve sclerosis.   8. The inferior vena cava is dilated in size with <50% respiratory  variability, suggesting right atrial pressure of 15 mmHg.   Patient Profile     89 y.o. female with CAD s/p DES to LAD (2021), DES RCA (12/2019), patent stents on coronary angiography (01/2020), paroxysmal AF, 2' hypoparathyroidism, HLD, HTN and CKD4 who is being seen for the evaluation of NSTEMI at the request of Dr. Dixie Frederickson.   Assessment & Plan    Acute on chronic systolic heart failure: Her EF is less than 20%.  She has elevated pulmonary pressures.  There is moderate to severe tricuspid regurgitation.  Given her significant comorbidities prognosis is poor.   This EF is new compared to one done at [facility] in May 2025.  At that point her EF was 75%.    Therapy is really limited to diuresis as tolerated with Lasix .  I will reduce to 40 mg bid given increased creat this morning.  Remove NTG paste from outside hospital.   NSTEMI: The patient has significant troponin elevation.  However, she has significant comorbid illnesses and would not be an anticoagulation candidate.  She would not be an invasive evaluation candidate with her renal insufficiency.  Continue aspirin .  Would not use heparin .  Atrial fibrillation: She has paroxysmal atrial fibrillation.  She is not a candidate for anticoagulation.  She has been taken off beta-blockers because of symptomatic bradycardia.  OK to continue very low dose beta blocker.      CKD:   She  is having aggressive renal sufficiency but I do think she does need some diuresis.     DM: Per primary team  Disposition: Overall I think prognosis is quite poor and her therapies are limited.  Goals of therapy discussions will be appropriate.  For questions or updates, please contact [facility] Please consult [email or web address] for contact info under Cardiology/STEMI.   Signed, Eilleen Grates, MD  06/28/2023, 4:23 PM

## 2023-06-28 NOTE — Assessment & Plan Note (Signed)
 Continue insulin  sliding scale for glucose cover and monitoring Fasting glucose today 184 mg/dl

## 2023-06-28 NOTE — Assessment & Plan Note (Addendum)
 Cell count with hgb at 7,8 and plt at 469  If continue to drop hgb may need transfusion in the setting of acute coronary syndrome.

## 2023-06-28 NOTE — Assessment & Plan Note (Addendum)
 Echocardiogram with new reduction in LV systolic function down to <20%, global hypokinesis, RV systolic function preserved, RVSP 62.6 mmHg, LA with moderate dilatation, RA with mild dilatation, mild mitral stenosis, moderate to severe tricuspid regurgitation. No pericardial effusion,   Acute on chronic core pulmonale  Patient continue volume overloaded. Systolic blood pressure 115 mmHg range.   Plan to continue diuresis with furosemide , will increase dose to 60 mg bid.  Limited pharmacologic options due to risk of hypotension and acutely reduced GFR.   Acute cardiogenic pulmonary edema with bilateral pleural effusions.  Will request right sided thoracentesis.  Continue diuresis and supplemental 02 per   Today 02 saturation is 91% on 2 L/min

## 2023-06-29 ENCOUNTER — Inpatient Hospital Stay (HOSPITAL_COMMUNITY)

## 2023-06-29 ENCOUNTER — Other Ambulatory Visit: Payer: Self-pay

## 2023-06-29 DIAGNOSIS — Z515 Encounter for palliative care: Secondary | ICD-10-CM

## 2023-06-29 DIAGNOSIS — D649 Anemia, unspecified: Secondary | ICD-10-CM

## 2023-06-29 DIAGNOSIS — N184 Chronic kidney disease, stage 4 (severe): Secondary | ICD-10-CM

## 2023-06-29 DIAGNOSIS — E1122 Type 2 diabetes mellitus with diabetic chronic kidney disease: Secondary | ICD-10-CM

## 2023-06-29 DIAGNOSIS — I2583 Coronary atherosclerosis due to lipid rich plaque: Secondary | ICD-10-CM

## 2023-06-29 DIAGNOSIS — J9601 Acute respiratory failure with hypoxia: Principal | ICD-10-CM

## 2023-06-29 DIAGNOSIS — I5023 Acute on chronic systolic (congestive) heart failure: Secondary | ICD-10-CM | POA: Diagnosis not present

## 2023-06-29 DIAGNOSIS — I10 Essential (primary) hypertension: Secondary | ICD-10-CM | POA: Diagnosis not present

## 2023-06-29 DIAGNOSIS — R4589 Other symptoms and signs involving emotional state: Secondary | ICD-10-CM

## 2023-06-29 DIAGNOSIS — I480 Paroxysmal atrial fibrillation: Secondary | ICD-10-CM | POA: Diagnosis not present

## 2023-06-29 DIAGNOSIS — I2510 Atherosclerotic heart disease of native coronary artery without angina pectoris: Secondary | ICD-10-CM | POA: Diagnosis not present

## 2023-06-29 DIAGNOSIS — Z7189 Other specified counseling: Secondary | ICD-10-CM

## 2023-06-29 DIAGNOSIS — Z66 Do not resuscitate: Secondary | ICD-10-CM

## 2023-06-29 LAB — BASIC METABOLIC PANEL WITH GFR
Anion gap: 11 (ref 5–15)
BUN: 46 mg/dL — ABNORMAL HIGH (ref 8–23)
CO2: 19 mmol/L — ABNORMAL LOW (ref 22–32)
Calcium: 8.3 mg/dL — ABNORMAL LOW (ref 8.9–10.3)
Chloride: 99 mmol/L (ref 98–111)
Creatinine, Ser: 3.08 mg/dL — ABNORMAL HIGH (ref 0.44–1.00)
GFR, Estimated: 14 mL/min — ABNORMAL LOW (ref >60)
Glucose, Bld: 102 mg/dL — ABNORMAL HIGH (ref 70–99)
Potassium: 4.8 mmol/L (ref 3.5–5.1)
Sodium: 129 mmol/L — ABNORMAL LOW (ref 135–145)

## 2023-06-29 LAB — LIPOPROTEIN A (LPA): Lipoprotein (a): 279.1 nmol/L — ABNORMAL HIGH (ref <75.0)

## 2023-06-29 LAB — CBC
HCT: 26 % — ABNORMAL LOW (ref 36.0–46.0)
Hemoglobin: 7.7 g/dL — ABNORMAL LOW (ref 12.0–15.0)
MCH: 28.1 pg (ref 26.0–34.0)
MCHC: 29.6 g/dL — ABNORMAL LOW (ref 30.0–36.0)
MCV: 94.9 fL (ref 80.0–100.0)
Platelets: 387 10*3/uL (ref 150–400)
RBC: 2.74 MIL/uL — ABNORMAL LOW (ref 3.87–5.11)
RDW: 16.8 % — ABNORMAL HIGH (ref 11.5–15.5)
WBC: 10.5 10*3/uL (ref 4.0–10.5)
nRBC: 0 % (ref 0.0–0.2)

## 2023-06-29 LAB — IRON AND TIBC
Iron: 29 ug/dL (ref 28–170)
Saturation Ratios: 12 % (ref 10.4–31.8)
TIBC: 252 ug/dL (ref 250–450)
UIBC: 223 ug/dL

## 2023-06-29 LAB — MAGNESIUM: Magnesium: 2.3 mg/dL (ref 1.7–2.4)

## 2023-06-29 LAB — TRANSFERRIN: Transferrin: 182 mg/dL — ABNORMAL LOW (ref 192–382)

## 2023-06-29 LAB — FERRITIN: Ferritin: 471 ng/mL — ABNORMAL HIGH (ref 11–307)

## 2023-06-29 LAB — BRAIN NATRIURETIC PEPTIDE: B Natriuretic Peptide: 3602.4 pg/mL — ABNORMAL HIGH (ref 0.0–100.0)

## 2023-06-29 MED ORDER — POLYETHYLENE GLYCOL 3350 17 G PO PACK
17.0000 g | PACK | Freq: Once | ORAL | Status: AC
  Administered 2023-06-29: 17 g via ORAL
  Filled 2023-06-29: qty 1

## 2023-06-29 MED ORDER — HYDROMORPHONE HCL 1 MG/ML IJ SOLN
0.5000 mg | INTRAMUSCULAR | Status: DC | PRN
  Administered 2023-06-29: 0.5 mg via INTRAVENOUS
  Filled 2023-06-29: qty 1

## 2023-06-29 MED ORDER — HYDROMORPHONE HCL 1 MG/ML IJ SOLN
0.5000 mg | INTRAMUSCULAR | Status: DC | PRN
  Administered 2023-06-29 – 2023-06-30 (×5): 0.5 mg via INTRAVENOUS
  Administered 2023-07-01 (×2): 1 mg via INTRAVENOUS
  Administered 2023-07-01: 0.5 mg via INTRAVENOUS
  Administered 2023-07-01 – 2023-07-02 (×3): 1 mg via INTRAVENOUS
  Filled 2023-06-29 (×12): qty 1

## 2023-06-29 MED ORDER — FUROSEMIDE 10 MG/ML IJ SOLN
40.0000 mg | Freq: Once | INTRAMUSCULAR | Status: AC
  Administered 2023-06-29: 40 mg via INTRAVENOUS
  Filled 2023-06-29: qty 4

## 2023-06-29 MED ORDER — POLYETHYLENE GLYCOL 3350 17 G PO PACK
17.0000 g | PACK | Freq: Every day | ORAL | Status: DC | PRN
  Administered 2023-06-30: 17 g via ORAL
  Filled 2023-06-29: qty 1

## 2023-06-29 MED ORDER — FUROSEMIDE 10 MG/ML IJ SOLN
60.0000 mg | Freq: Once | INTRAMUSCULAR | Status: AC
  Administered 2023-06-29: 60 mg via INTRAVENOUS
  Filled 2023-06-29: qty 6

## 2023-06-29 MED ORDER — DOCUSATE SODIUM 100 MG PO CAPS
100.0000 mg | ORAL_CAPSULE | Freq: Two times a day (BID) | ORAL | Status: DC
  Administered 2023-06-29 – 2023-07-02 (×7): 100 mg via ORAL
  Filled 2023-06-29 (×8): qty 1

## 2023-06-29 MED ORDER — DIAZEPAM 5 MG PO TABS
2.5000 mg | ORAL_TABLET | Freq: Two times a day (BID) | ORAL | Status: DC | PRN
  Administered 2023-06-29: 2.5 mg via ORAL
  Filled 2023-06-29 (×2): qty 1

## 2023-06-29 NOTE — Progress Notes (Signed)
  Progress Note  Patient Name: Crystal [name] Date of Encounter: 06/29/2023 [facility] Cardiologist: Teddie Favre, MD   Interval Summary   Patient seen and examined at her bedside.  Vital Signs Vitals:   06/29/23 0700 06/29/23 0710 06/29/23 0716 06/29/23 0733  BP:  (!) 140/60    Pulse:  86    Resp:  18    Temp:  98.1 F (36.7 C)    TempSrc:  Oral    SpO2: 93% 90% (!) 86% 92%  Weight:      Height:        Intake/Output Summary (Last 24 hours) at 06/29/2023 0748 Last data filed at 06/29/2023 0358 Gross per 24 hour  Intake 277 ml  Output 600 ml  Net -323 ml      06/29/2023    5:33 AM 06/28/2023   12:10 AM 06/06/2023    5:59 AM  Last 3 Weights  Weight (lbs) 125 lb 14.1 oz 130 lb 8.2 oz 116 lb  Weight (kg) 57.1 kg 59.2 kg 52.617 kg      Telemetry/ECG  Sinus rhythm - Personally Reviewed  Physical Exam  GEN: No acute distress.   Neck: No JVD Cardiac: RRR, no murmurs, rubs, or gallops.  Respiratory: Clear to auscultation bilaterally. GI: Soft, nontender, non-distended  MS: No edema  Assessment & Plan  Acute on Chronic Systolic heart failure - recent EF 20% and this is new. Her best treatment plan at this time is diuretic and optimizing GDMT as renal function and blood pressure can tolerate. Her Lasix  was decreased due to cr yesterday - and still continues to worsened. Keep her on the current dose of Lasix  for now. I think she can benefit from having nephrology see her as well.  Recent bradycardia and beta blocker was stopped.   NSTEMI - continue aspirin . Invasive evaluation and anticoagulation at this time would not benefit this patient ( worsening kidney function and high risk of bleed). Thankfully no angina symptoms. If needed we can optimize antianginals   PAF - is sinus rhythm, keep off beta blocker which was stopped due to symptomatic bradycardia, if need we may try very low dose beta blocker  CKD - cr worsening, we are in the difficult clinical  situation. She needs diuretics. Please have nephrology help here as well.   DM - per primary team   Disposition - poor prognosis , will benefit fro    For questions or updates, please contact [facility] Please consult [email or web address] for contact info under       Signed, Alizeh Madril, DO

## 2023-06-29 NOTE — Progress Notes (Signed)
 Progress Note   Patient: Crystal [name] YNW:[id number] DOB: 1934-09-16 DOA: 06/27/2023     2 DOS: the patient was seen and examined on 06/29/2023   Brief hospital course: Mrs. Kollman was admitted to the hospital with the working diagnosis of acute heart failure decompensation in the setting of NSTEMI.   89 yo female with the past medical history of atrial fibrillation, CKD stage IV, subdural hematoma, hypertension, hyperlipidemia, coronary artery disease and iron deficiency anemia.    05/13 hospitalized with pneumonia and subdural hematoma due to mechanical fall, she was discharged to SNF.   05/27 transferred from SNF to [facility] with diagnosis of bradycardia, weakness, renal failure and hyperkalemia.  Carvedilol  was discontinued with improvement in heart rate, she received IV fluids with improvement in renal function, with a serum cr from 6.1 down to 3.2 05/30 patient had acute respiratory distress, rapid response called, found with volume overload, troponin went up from 120 to 2800. Cardiology was contacted in [facility] for transfer to tertiary care. Unfortunately no beds available, at [facility] accepted patient for transfer.   05/31 She was transferred to from [facility] to [facility], her blood pressure was 131/57, RR 30, HR 85, and 02 saturation 94% on room air.  Lungs with no wheezing or rhonchi, heart with S1 and S2 (prominent S2) present and regular with no gallops, rubs or murmurs, abdomen with no distention, no lower extremity edema.   Na 133, K 5.2 CL 103 bicarbonate 19 glucose 135 bun 38 Cr 2.74  BNP >4,500 High sensitive troponin 1,828 and 1,613  Wbc 13.7 hgb 8.6 plt 452   CT head with suspected trace 1-2 mm subacute subdural hematoma overlying the left frontal convexity. No significant mass effect or midline shift.  Generalized age related atrophy with moderate chronic small vessel ischemic disease with a few remote lacunar infarcts about the left frontal centrum ovale and left cerebellum.    Chest radiograph with right rotation, positive cardiomegaly with bilateral hilar vascular congestion, bilateral central interstitial infiltrates and bilateral moderate pleural effusions, more right than left.   EKG 87 bpm, normal axis, right bundle branch block, qtc 493, sinus rhythm with no significant ST segment or T wave changes.   06/02 patient with worsening dyspnea and persistent volume overload. Poor prognosis.   Assessment and Plan: * Acute on chronic systolic CHF (congestive heart failure) (HCC) Echocardiogram with new reduction in LV systolic function down to <20%, global hypokinesis, RV systolic function preserved, RVSP 62.6 mmHg, LA with moderate dilatation, RA with mild dilatation, mild mitral stenosis, moderate to severe tricuspid regurgitation. No pericardial effusion,   Acute on chronic core pulmonale  Patient continue volume overloaded. Documented urine output is 600 cc  Systolic blood pressure 120 mmHg range.   This morning with worsening respiratory distress, and worsening renal function, persistent volume overload.  Will add  one dose of IV furosemide  60 mg IV, continue with IV diuresis as tolerated.  Limited pharmacologic options due to risk of hypotension and acutely reduced GFR.   Acute cardiogenic pulmonary edema with bilateral pleural effusions.  06/02 follow up chest radiograph with right rotation, bilateral hilar vascular congestion, cephalization of the vasculature, bilateral central interstitial infiltrates, and bilateral pleural effusions.   This morning in respiratory distress, increased 02 requirements to 15 L per nasal cannula.  Will place patient on Bipap and hold on thoracentesis for now due to patient not stable for procedure.  Poor prognosis, will contact her nice POA.   CAD (coronary artery  disease) NSTEMI,.   New drop in LV systolic function, no regional wall motion abnormalities.  No chest pain but positive dyspnea.    Not candidate for  anticoagulation due to recent intracranial bleed.  She has a poor physical functional status.    Essential hypertension Continue blood pressure monitoring  Diuresis as tolerated.   AF (paroxysmal atrial fibrillation) (HCC) Currently sinus rhythm, continue amiodarone .  No B blocker due to bradycardia  No anticoagulation due to recent intracranial bleed.   CKD (chronic kidney disease), stage IV (HCC) Hyponatremia, hyperkalemia,   Worsening renal function with serum cr at 3,0 with K at 4,8 and serum bicarbonate at 19  Na 129 and Mg 2.3   Very poor prognosis, likely not candidate for renal replacement therapy due to advanced heart failure.  Continue diuresis as tolerated.  Will consult palliative care.   Type 2 diabetes mellitus (HCC) Continue insulin  sliding scale for glucose cover and monitoring Fasting glucose today 102 mg/dl   Anemia Cell count with hgb at 7,7 and plt at 469  Iron panel with serum iron at 29, TIBC 252, transferrin saturation 12 and transferrin 182, ferritin 471.      Subjective: Patient with worsening dyspnea and fatigue, no chest pain, increased 02 requirements.   Physical Exam: Vitals:   06/29/23 0716 06/29/23 0733 06/29/23 0817 06/29/23 0858  BP:   (!) 154/65 (!) 154/65  Pulse:   98 78  Resp:   (!) 24 (!) 31  Temp:      TempSrc:      SpO2: (!) 86% 92% 90% (!) 89%  Weight:      Height:       Neurology awake, deconditioned and ill looking appearing ENT with positive pallor Cardiovascular with S1 and S2 present and regular with no gallops or rubs, positive systolic murmur at the right lower sternal border.  Positive JVD Trace lower extremity edema Respiratory with bilateral diffuse rales with no wheezing or rhonchi  Abdomen with no distention  Data Reviewed:    Family Communication: no family at the bedside   Disposition: Status is: Inpatient Remains inpatient appropriate because: medical therapy, poor prognosis, may need palliative care.    Planned Discharge Destination: to be determined      Author: Albertus Alt, MD 06/29/2023 9:06 AM  For on call review [email or web address].

## 2023-06-29 NOTE — Progress Notes (Signed)
 [facility] night cross cover note:   I was notified by the patient's RN of the pt's request for medication for anxiety.  I subsequently resumed the patient's home Valium , which she takes on a prn basis for anxiety.      Camelia Cavalier, DO Hospitalist

## 2023-06-29 NOTE — Plan of Care (Signed)
  Problem: Pain Managment: Goal: General experience of comfort will improve and/or be controlled Outcome: Progressing

## 2023-06-29 NOTE — IPAL (Signed)
 I spoke with her nice about patient's worsening and poor prognosis.

## 2023-06-29 NOTE — Progress Notes (Signed)
 [facility] night cross cover note:   I was notified by the patient's RN of the patient's complaint of constipation.  I subsequently ordered MiraLAX  17 g p.o. x 1 dose now followed by MiraLAX  daily as needed.  Additionally, I ordered Colace 100 mg p.o. twice daily, with first dose now.     Camelia Cavalier, DO Hospitalist

## 2023-06-29 NOTE — Consult Note (Signed)
 Palliative Medicine Inpatient Consult Note  Consulting Provider:  Albertus Alt, MD   Reason for consult:   Palliative Care Consult Services Palliative Medicine Consult  Reason for Consult? worening clinical condition wiht poor prognosis   06/29/2023  HPI:  Per intake H&P -->  89 yo female with the past medical history of atrial fibrillation, CKD stage IV, subdural hematoma, hypertension, hyperlipidemia, coronary artery disease and iron deficiency anemia. The Palliative care team has been asked to support additional goals of care conversations in the setting of declining health d/t heart failure.   Clinical Assessment/Goals of Care:  *Please note that this is a verbal dictation therefore any spelling or grammatical errors are due to the "[employer]" system interpretation.  I have reviewed medical records including EPIC notes, labs and imaging, received report from bedside RN, assessed the patient who is lying in bed notably anxious.    I met with patients niece, Virginia  and niece-in-law to further discuss diagnosis prognosis, GOC, EOL wishes, disposition and options.   I introduced Palliative Medicine as specialized medical care for people living with serious illness. It focuses on providing relief from the symptoms and stress of a serious illness. The goal is to improve quality of life for both the patient and the family.  Medical History Review and Understanding:  A review of Kaytlyn's past medical history significant for coronary artery disease status post STEMI with drug-eluting stent placements, congestive heart failure, hypertension, chronic kidney disease, breast cancer, paroxysmal atrial fibrillation, and type 2 diabetes was completed.  Social History:  Tamikka lives in [place], [place] .  She has been a widow for the past 55 years.  She never had any children.  She has a niece and a nephew with whom she is incredibly close to.  She formally worked  for [employer].  Lexxus is someone who got great joy out of her autonomy.  She is a woman of the [place] faith.  Functional and Nutritional State:  Prior to hospitalization roughly 3 weeks ago Azaryah was doing fairly living with her niece, Virginia .  She was able to attend to all BADLs and had a good appetite.  Advance Directives:  A detailed discussion was had today regarding advanced directives. Kemi advanced directives and a living will completed and a dedicated healthcare power of attorney should she not be able to make her own decisions. Her dedicated power of attorney is her niece Virginia  Gaylyn Keas, her nephew Magdaline Schools, and her other niece Andreas Bandy. A copy of these have been requested for review.   Code Status:  Concepts specific to code status, artifical feeding and hydration, continued IV antibiotics and rehospitalization was had.  The difference between a aggressive medical intervention path  and a palliative comfort care path for this patient at this time was had.   A review of Merrily's prior MOST form was completed.  She had in the past elected to be a DO NOT RESUSCITATE, DO NOT INTUBATE CODE STATUS she had wanted antibiotics maintenance IV fluids though did not want artificial nutrition.    Jonalyn's niece Virginia  elected when she transition to an outside hospital for her to be full code/full scope of care.  I shared with patient's niece, Virginia  my concerns if we were to play surely through aggressive resuscitative efforts.  We reviewed the possible impacts that this could have unsurely's function and quality of life in the future.  Patient's niece agrees that this would not be consistent with which  really would desire therefore it was determined to make Jakin a DO NOT RESUSCITATE DO NOT INTUBATE CODE STATUS from a full CODE STATUS.  Discussion:  We discussed surely circumstances leading to this hospitalization.  Patient's niece shares she has been  in and out of the hospital over a period of months for things like pneumonia however she has been able to bounce back quite incredibly.  We reviewed that these last 3 weeks whereby she has been acutely in and out of the hospital have been a downward spiral.  We reviewed the chronic disease trajectory in patients who have multiple co-morbidities. I shared that often an event will occur leading to an  acute hospitalizationsuch as a fall, UTI, PNA, heart failure exacerbation, copd exacerbation, or another illness sort. We discussed that patients may have been functioning at a high plateau initially, then an acute event occurs. We discussed that after this event their function, mental, and nutritional states are compromised. Often with treatment and rehabilitation there is some regain in each individuals health, though often not to their prior baseline level. We discussed that then another event will occur causing a rehospitalization and a further decline. I shared that often this will become a pattern and each event causes greater burden to the individual, depleting them further or their function, cognition, or nutritional state.   Patient's niece, Virginia  agrees that she really has been declining and it has been 1 event after the other.  We reviewed Avanell's present state and the concerns associated with her illness at this time inclusive of profound systolic heart failure with a less than 20% ejection fraction.  We reviewed the effect that this has and how difficult patients like surely are to diuresis.  We discussed how her heart function and kidney function are closely related and unfortunately we are seeing worsening of her kidneys despite aggressive intervention for her heart failure.  In addition to patient's heart and kidneys we are also seeing as a result of her pulmonary edema Maecyn is having a much more difficult time with her breathing.  In the setting of patient's multiple organ system  dysfunction we discussed the short and long-term concerns of Desa's condition(s).   Patient's niece and I discussed the idea of continuing present measures not escalating care to the level of the ICU and the interventions which would be pursued there.  We reviewed if Adeana's body is able to improve with present interventions that would be wonderful though if her condition worsens or her ability to tolerate BiPAP decreases then a comfort based approach to care would be very much within reason.  We discussed comfort care would eliminate aggressive interventions, needlesticks, radiographical imaging, Accu-Chek monitoring, maintenance IV fluids/artificial nutrition, and intravenous antibiotics.  We further discussed that once someone is on the path of comfort care we would treat symptoms aggressively allowing for peaceful passing from this earth.  I shared in the setting of Deshea's hypoxia it would be very likely she would require Dilaudid  drip.  We reviewed the advantages and disadvantages of pursuing this the advantage being it would help her respiratory drive the disadvantage being it would make her more somnolent.  Plan at this time is to continue present interventions.  Patient's niece plans to speak to her brother to determine the best path moving forward.  Discussed the importance of continued conversation with family and their  medical providers regarding overall plan of care and treatment options, ensuring decisions are within the context of the patients values  and GOCs.  Decision Maker: Martin,Virginia  (Niece): [phone] (Mobile)   SUMMARY OF RECOMMENDATIONS   DNAR/DNI  Continue present care with no escalation  Open and honest conversations held in the setting of patient's multisystem organ dysfunction and likely poor outcomes  Should patient decline discussions related to comfort care were held  Ongoing palliative care support  Code Status/Advance Care Planning: DNAR/DNI    Palliative Prophylaxis:  Aspiration, Bowel Regimen, Delirium Protocol, Frequent Pain Assessment, Oral Care, Palliative Wound Care, and Turn Reposition  Additional Recommendations (Limitations, Scope, Preferences): Continue present level of care without escalation  Psycho-social/Spiritual:  Desire for further Chaplaincy support: Not at this time - family feels it might scare her Additional Recommendations: Education on multisystem organ dysfunction associated with end-stage heart failure   Prognosis: Limited overall.  Discharge Planning: Discharge plan is uncertain at this time.  Vitals:   06/29/23 1043 06/29/23 1113  BP: (!) 126/55 (!) 126/55  Pulse: 83   Resp: (!) 27 (!) 28  Temp: 98.6 F (37 C)   SpO2: 99% 97%    Intake/Output Summary (Last 24 hours) at 06/29/2023 1416 Last data filed at 06/29/2023 0981 Gross per 24 hour  Intake 100 ml  Output 1000 ml  Net -900 ml   Last Weight  Most recent update: 06/29/2023  5:58 AM    Weight  57.1 kg (125 lb 14.1 oz)            Gen: Elderly Caucasian female acutely ill-appearing HEENT: Dry mucous membranes CV: Regular rate and rhythm  PULM: On BiPAP ABD: soft/nontender  EXT: No edema  Neuro: Aware of self and place  PPS: 20%    This conversation/these recommendations were discussed with patient primary care team, Dr. Sunnie England ______________________________________________________ [facility] Palliative Medicine Team Team Cell Phone: [phone] Please utilize secure chat with additional questions, if there is no response within 30 minutes please call the above phone number  Total Time: 75 Billing based on MDM: High  Palliative Medicine Team providers are available by phone from 7am to 7pm daily and can be reached through the team cell phone.  Should this patient require assistance outside of these hours, please call the patient's attending physician.

## 2023-06-29 NOTE — Evaluation (Signed)
 Clinical/Bedside Swallow Evaluation Patient Details  Name: Crystal [name] MRN: [id number] Date of Birth: 1935-01-25  Today's Date: 06/29/2023 Time: SLP Start Time (ACUTE ONLY): 7564 SLP Stop Time (ACUTE ONLY): 0837 SLP Time Calculation (min) (ACUTE ONLY): 14 min  Past Medical History:  Past Medical History:  Diagnosis Date   Anemia    Bell's palsy    Breast cancer (HCC) 1998   Right mastectomy   CAD (coronary artery disease)    a. s/p STEMI in 01/2019 with DES to LAD b. s/p NSTEMI in 12/2019 with DES to RCA c. cath in 01/2020 showing patent stents   CHF (congestive heart failure) (HCC)    a. EF 30-35% in 01/2019 b. 40-45% in 01/2020 c. EF at 65-70% by echo in 05/2020   Chronic kidney disease    Essential hypertension    GERD (gastroesophageal reflux disease)    Gout    History of skin cancer    Squamous cell, left shoulder   Mixed hyperlipidemia    NSTEMI (non-ST elevated myocardial infarction) (HCC)    12/2019   Osteopenia    Paroxysmal atrial fibrillation (HCC)    Thyroid  nodule    Type 2 diabetes mellitus (HCC)    Past Surgical History:  Past Surgical History:  Procedure Laterality Date   APPENDECTOMY     BIOPSY  10/13/2019   Procedure: BIOPSY;  Surgeon: Suzette Espy, MD;  [facility]: [facility];  Service: Endoscopy;;   CATARACT EXTRACTION  2016   COLONOSCOPY  2019   Dr Perri Brake   CORONARY ANGIOGRAPHY N/A 01/16/2020   Procedure: CORONARY ANGIOGRAPHY;  Surgeon: Lucendia Rusk, MD;  Location: [facility] INVASIVE CV LAB;  Service: Cardiovascular;  Laterality: N/A;   CORONARY STENT INTERVENTION N/A 01/16/2020   Procedure: CORONARY STENT INTERVENTION;  Surgeon: Lucendia Rusk, MD;  Location: [facility] INVASIVE CV LAB;  Service: Cardiovascular;  Laterality: N/A;   CORONARY ULTRASOUND/IVUS N/A 01/16/2020   Procedure: Intravascular Ultrasound/IVUS;  Surgeon: Lucendia Rusk, MD;  Location: [facility] INVASIVE CV LAB;  Service: Cardiovascular;  Laterality: N/A;    CORONARY/GRAFT ACUTE MI REVASCULARIZATION N/A 01/30/2019   Procedure: Coronary/Graft Acute MI Revascularization;  Surgeon: Odie Benne, MD;  Location: [facility] INVASIVE CV LAB;  Service: Cardiovascular;  Laterality: N/A;   ESOPHAGOGASTRODUODENOSCOPY (EGD) WITH PROPOFOL  N/A 10/13/2019   Non-obstructing Schatzki ring at GE junction, s/p dilation, erosive gastropathy with stigmata of recent bleeding, normal duodenum. Negative H.pylori.    HYSTEROSCOPY     LAPAROSCOPIC APPENDECTOMY N/A 08/03/2020   Procedure: APPENDECTOMY LAPAROSCOPIC;  Surgeon: Flynn Hylan, MD;  Location: [facility];  Service: General;  Laterality: N/A;   LEFT HEART CATH AND CORONARY ANGIOGRAPHY N/A 01/30/2019   Procedure: LEFT HEART CATH AND CORONARY ANGIOGRAPHY;  Surgeon: Odie Benne, MD;  Location: [facility] INVASIVE CV LAB;  Service: Cardiovascular;  Laterality: N/A;   LEFT HEART CATH AND CORONARY ANGIOGRAPHY N/A 01/16/2020   Procedure: LEFT HEART CATH AND CORONARY ANGIOGRAPHY;  Surgeon: Lucendia Rusk, MD;  Location: [facility] INVASIVE CV LAB;  Service: Cardiovascular;  Laterality: N/A;   MALONEY DILATION N/A 10/13/2019   Procedure: Londa Rival DILATION;  Surgeon: Suzette Espy, MD;  Location: [facility];  Service: Endoscopy;  Laterality: N/A;   Right mastectomy  1998   [place]   RIGHT/LEFT HEART CATH AND CORONARY ANGIOGRAPHY N/A 02/13/2020   Procedure: RIGHT/LEFT HEART CATH AND CORONARY ANGIOGRAPHY;  Surgeon: Swaziland, Peter M, MD;  Location: [facility] INVASIVE CV LAB;  Service: Cardiovascular;  Laterality: N/A;   HPI:  89 y.o. female with CAD s/p DES to LAD (2021), DES RCA (12/2019), patent stents on coronary angiography (01/2020), paroxysmal AF, 2' hypoparathyroidism, HLD, HTN and CKD4 who is being seen for the evaluation of NSTEMI at the request of Dr. Dixie Frederickson Diagnosed with acute on chronic systolis hear failure. Pt notified RN of increassing jaw pain (leads to difficulty chewing her meals and difficulty swallowing  "for awhile." CXR Persistent interstitial edema and bilateral pleural effusions  compatible with CHF. Pleural effusions appear decreased in the interval. BSE 01/2020 some difficulty swallowing pills. Reg/thin recommended.    Assessment / Plan / Recommendation  Clinical Impression  Pt stated she does not feel good this morning and is SOB and appears anxious. She stated jaw pain began several days ago and hurts to chew. Oromotor abilities were functional. She denied coughing with po's but reports frequent belching which was noted during today's assessment. There were no s/s aspiration and she was able to masticate regular texture without difficulty. She states her appetite is decreased. Pt also reports ordering broth for breakfast. Dowgrading pt's diet will limit her choices therefore recommend continuing pt on regular texture with pt choosing foods/textures she feels she can masticate easier or choose broths/soups. As she is not feeling good/SOB her appetite will likely be low. Dietary arrived and pt ordered meatloaf for lunch. ST will plan to follow up. SLP Visit Diagnosis: Dysphagia, unspecified (R13.10)    Aspiration Risk  Mild aspiration risk    Diet Recommendation Regular;Thin liquid    Liquid Administration via: Straw;Cup Medication Administration: Whole meds with liquid Supervision: Patient able to self feed Compensations: Slow rate;Small sips/bites Postural Changes: Seated upright at 90 degrees    Other  Recommendations Oral Care Recommendations: Oral care BID    Recommendations for follow up therapy are one component of a multi-disciplinary discharge planning process, led by the attending physician.  Recommendations may be updated based on patient status, additional functional criteria and insurance authorization.  Follow up Recommendations No SLP follow up      Assistance Recommended at Discharge    Functional Status Assessment Patient has had a recent decline in their functional  status and demonstrates the ability to make significant improvements in function in a reasonable and predictable amount of time.  Frequency and Duration min 1 x/week  1 week       Prognosis Prognosis for improved oropharyngeal function: Good      Swallow Study   General HPI: 89 y.o. female with CAD s/p DES to LAD (2021), DES RCA (12/2019), patent stents on coronary angiography (01/2020), paroxysmal AF, 2' hypoparathyroidism, HLD, HTN and CKD4 who is being seen for the evaluation of NSTEMI at the request of Dr. Dixie Frederickson Diagnosed with acute on chronic systolis hear failure. Pt notified RN of increassing jaw pain (leads to difficulty chewing her meals and difficulty swallowing "for awhile." CXR Persistent interstitial edema and bilateral pleural effusions  compatible with CHF. Pleural effusions appear decreased in the interval. BSE 01/2020 some difficulty swallowing pills. Reg/thin recommended. Type of Study: Bedside Swallow Evaluation Previous Swallow Assessment:  (see HPI) Diet Prior to this Study: Regular;Thin liquids (Level 0) Temperature Spikes Noted: No Respiratory Status:  (HFNC 10 L) History of Recent Intubation: No Behavior/Cognition: Alert;Cooperative (SOB and appears anxious) Oral Cavity Assessment: Within Functional Limits Oral Care Completed by SLP: No Oral Cavity - Dentition: Adequate natural dentition Vision: Functional for self-feeding Self-Feeding Abilities: Able to feed self Patient Positioning: Upright in bed Baseline Vocal Quality: Normal Volitional  Cough: Strong Volitional Swallow: Able to elicit    Oral/Motor/Sensory Function Overall Oral Motor/Sensory Function: Within functional limits   Ice Chips Ice chips: Not tested   Thin Liquid Thin Liquid: Within functional limits Presentation: Straw    Nectar Thick Nectar Thick Liquid: Not tested   Honey Thick Honey Thick Liquid: Not tested   Puree Puree: Within functional limits   Solid     Solid: Within functional  limits      Aleda Ammon Nola Battiest 06/29/2023,8:58 AM

## 2023-06-29 NOTE — Progress Notes (Signed)
 [facility] night cross cover note:   I was notified by the patient's RN that the patient is experiencing some increased shortness of breath associated with interval increase in supplemental oxygen  requirements.  Relative to 5 L nasal cannula earlier this evening, upon which she had been maintaining oxygen  saturations in the mid 90s, she experienced desaturation into the high 80s, associated with increased respiratory rate into the high 20s to low 30s, with heart rate in the 80s to low 90s, with BP 151/64, prompting initiation of high flow nasal cannula, with rate as high as 11 L/min.  Gradually, the rate of her high flow nasal cannula has been reduced, now at 7 L/min, with corresponding oxygen  saturation 95%.  Respiratory rate is also improving, now, lower 20s.  Per brief chart review, this is an 89 year old female who is hospitalized with acute on chronic systolic heart failure as well as NSTEMI, for which she is deemed by cardiology to not be an invasive evaluation candidate.  Plan has included IV diuresis, although the dose of her Lasix  was recently decreased due to worsening renal function.  Preliminary labs this morning include hemoglobin of 7.7, relative to 7.8 yesterday morning.  In light of the above, I have ordered a stat chest x-ray, EKG, BNP, as well as an additional dose of Lasix  40 mg IV x 1 now.    Update: Chest x-ray, relative to CXR from 06/27/23, demonstrates persistent interstitial edema as well as bilateral pleural effusions consistent with CHF, with radiologist noting interval decrease in the size of the bilateral pleural effusions. Updated EKG, compared to EKG from 9:20 AM on 06/28/2023, shows sinus rhythm with heart rate 86, right bundle branch block, T wave inversions in V1 through V5, of which T wave inversion in V4, V5 appear new, while also demonstrating less than 1 mm ST depression in V2 through V4, of which the ST depression in V3 and V4 appears unchanged from most recent prior EKG,  with this current EKG showing no evidence of ST elevation.    Camelia Cavalier, DO Hospitalist

## 2023-06-30 DIAGNOSIS — I10 Essential (primary) hypertension: Secondary | ICD-10-CM | POA: Diagnosis not present

## 2023-06-30 DIAGNOSIS — I480 Paroxysmal atrial fibrillation: Secondary | ICD-10-CM | POA: Diagnosis not present

## 2023-06-30 DIAGNOSIS — I5023 Acute on chronic systolic (congestive) heart failure: Secondary | ICD-10-CM | POA: Diagnosis not present

## 2023-06-30 DIAGNOSIS — I2510 Atherosclerotic heart disease of native coronary artery without angina pectoris: Secondary | ICD-10-CM | POA: Diagnosis not present

## 2023-06-30 LAB — BASIC METABOLIC PANEL WITH GFR
Anion gap: 14 (ref 5–15)
BUN: 57 mg/dL — ABNORMAL HIGH (ref 8–23)
CO2: 19 mmol/L — ABNORMAL LOW (ref 22–32)
Calcium: 8.7 mg/dL — ABNORMAL LOW (ref 8.9–10.3)
Chloride: 100 mmol/L (ref 98–111)
Creatinine, Ser: 3.06 mg/dL — ABNORMAL HIGH (ref 0.44–1.00)
GFR, Estimated: 14 mL/min — ABNORMAL LOW (ref >60)
Glucose, Bld: 184 mg/dL — ABNORMAL HIGH (ref 70–99)
Potassium: 4.9 mmol/L (ref 3.5–5.1)
Sodium: 133 mmol/L — ABNORMAL LOW (ref 135–145)

## 2023-06-30 MED ORDER — FUROSEMIDE 10 MG/ML IJ SOLN
60.0000 mg | Freq: Two times a day (BID) | INTRAMUSCULAR | Status: DC
  Administered 2023-06-30 – 2023-07-02 (×4): 60 mg via INTRAVENOUS
  Filled 2023-06-30 (×4): qty 6

## 2023-06-30 MED ORDER — BISACODYL 5 MG PO TBEC
5.0000 mg | DELAYED_RELEASE_TABLET | Freq: Once | ORAL | Status: AC
  Administered 2023-06-30: 5 mg via ORAL
  Filled 2023-06-30: qty 1

## 2023-06-30 NOTE — Progress Notes (Signed)
 PROGRESS NOTE    Crystal [name]  ZOX:[id number] DOB: Dec 12, 1934 DOA: 06/27/2023 PCP: Orlena Bitters, MD  88/F w Pafib,  systolic CHF, CKD stage IV, subdural hematoma, hypertension, CAD and iron deficiency anemia.  -5/13 hospitalized with pneumonia and subdural hematoma due to mechanical fall, she was discharged to SNF.   -5/27 transferred from SNF to [facility] with diagnosis of bradycardia, weakness, renal failure and hyperkalemia.  Carvedilol  was discontinued with improvement in heart rate, she received IV fluids with improvement in renal function, with a serum cr from 6.1 down to 3.2 -5/30 patient had acute respiratory distress, rapid response called, found with volume overload, troponin went up from 120 to 2800. Cardiology was contacted in [facility] for transfer to tertiary care. Unfortunately no beds available, at [facility]. [facility] accepted patient for transfer.  -5/31 She was transferred to from [facility] to [facility],  Labs -bun 38 Cr 2.74, BNP >4,500,  troponin 1,828 and 1,613 , hgb 8.6 plt 452  -CT head with suspected trace 1-2 mm subacute subdural hematoma overlying the left frontal convexity. No significant mass effect or midline shift. Generalized age related atrophy with moderate chronic small vessel ischemic disease with a few remote lacunar infarcts about the left frontal centrum ovale and left cerebellum.  -CXR -right rotation, positive cardiomegaly with bilateral hilar vascular congestion, bilateral central interstitial infiltrates and bilateral moderate pleural effusions, more right than left.  -6/02 patient with worsening dyspnea and persistent volume overload. Poor prognosis.  -6/03 patient off bipap, continue with high oxygen  demand, code status changed to DNR. No further escalation of care.  Subjective:   Assessment and Plan:  Acute on chronic systolic CHF  Pulm  HTN -Echo with new reduction in LV systolic function down to <20%, global hypokinesis, RV systolic function preserved, moderate to severe  tricuspid regurgitation.  -Continue with furosemide  60 mg IV bid  Limited pharmacologic options due to risk of hypotension and acutely reduced GFR.   Acute hypoxemic respiratory failure  Acute cardiogenic pulmonary edema with bilateral pleural effusions.  06/02 follow up chest radiograph with right rotation, bilateral hilar vascular congestion, cephalization of the vasculature, bilateral central interstitial infiltrates, and bilateral pleural effusions.  -Continue diuresis, hold on thoracentesis for now.  Hydromorphone  as needed for severe dyspnea, as palliative measure.   CAD (coronary artery disease) NSTEMI,.  New drop in LV systolic function, no regional wall motion abnormalities.  Not candidate for anticoagulation due to recent intracranial bleed.  She has a poor physical functional status.    Essential hypertension Continue blood pressure monitoring  Diuresis as tolerated.   AF (paroxysmal atrial fibrillation) (HCC) Currently sinus rhythm, continue amiodarone .  No B blocker due to bradycardia  No anticoagulation due to recent intracranial bleed.   CKD (chronic kidney disease), stage IV (HCC) Hyponatremia, hyperkalemia,  -serum cr at 3.0 with K at 4,9  Very poor prognosis, likely not candidate for renal replacement therapy due to advanced heart failure.  Continue diuresis as tolerated.  Type 2 diabetes mellitus (HCC) Continue insulin  sliding scale for glucose cover and monitoring Fasting glucose today 184 mg/dl   Anemia Cell count with hgb at 7,7 and plt at 469  Iron panel with serum iron at 29, TIBC 252, transferrin saturation 12 and transferrin 182, ferritin 471.    DVT prophylaxis: SCDs Code Status: DNR Family Communication: Disposition Plan:   Consultants:    Procedures:   Antimicrobials:    Objective: Vitals:   06/30/23 0406 06/30/23 0744 06/30/23 0745 06/30/23 1118  BP: 126/60 (!) 146/63 (!) 146/63 (!) 126/56  Pulse: 80 92 91 87  Resp: 17 (!) 25 (!)  25 (!) 25  Temp: 97.8 F (36.6 C) 98 F (36.7 C)  (!) 97.5 F (36.4 C)  TempSrc: Oral Oral  Oral  SpO2: 98% 95% 94% 97%  Weight: 56.2 kg     Height:        Intake/Output Summary (Last 24 hours) at 06/30/2023 1612 Last data filed at 06/30/2023 0924 Gross per 24 hour  Intake 237 ml  Output 600 ml  Net -363 ml   Filed Weights   06/28/23 0010 06/29/23 0533 06/30/23 0406  Weight: 59.2 kg 57.1 kg 56.2 kg    Examination:  General exam: Appears calm and comfortable  Respiratory system: Clear to auscultation Cardiovascular system: S1 & S2 heard, RRR.  Abd: nondistended, soft and nontender.Normal bowel sounds heard. Central nervous system: Alert and oriented. No focal neurological deficits. Extremities: no edema Skin: No rashes Psychiatry:  Mood & affect appropriate.     Data Reviewed:   CBC: Recent Labs  Lab 06/27/23 1815 06/28/23 0256 06/29/23 0317  WBC 13.7* 12.1* 10.5  NEUTROABS 10.5*  --   --   HGB 8.6* 7.8* 7.7*  HCT 28.0* 25.7* 26.0*  MCV 93.3 92.4 94.9  PLT 452* 469* 387   Basic Metabolic Panel: Recent Labs  Lab 06/27/23 1815 06/28/23 0256 06/29/23 0317 06/30/23 0910  NA 133* 132* 129* 133*  K 5.2* 5.2* 4.8 4.9  CL 103 103 99 100  CO2 19* 20* 19* 19*  GLUCOSE 135* 127* 102* 184*  BUN 38* 39* 46* 57*  CREATININE 2.74* 2.84* 3.08* 3.06*  CALCIUM  8.6* 8.5* 8.3* 8.7*  MG  --   --  2.3  --    GFR: Estimated Creatinine Clearance: 11.7 mL/min (A) (by C-G formula based on SCr of 3.06 mg/dL (H)). Liver Function Tests: No results for input(s): "AST", "ALT", "ALKPHOS", "BILITOT", "PROT", "ALBUMIN " in the last 168 hours. No results for input(s): "LIPASE", "AMYLASE" in the last 168 hours. No results for input(s): "AMMONIA" in the last 168 hours. Coagulation Profile: No results for input(s): "INR", "PROTIME" in the last 168 hours. Cardiac Enzymes: No results for input(s): "CKTOTAL", "CKMB", "CKMBINDEX", "TROPONINI" in the last 168 hours. BNP (last 3  results) No results for input(s): "PROBNP" in the last 8760 hours. HbA1C: No results for input(s): "HGBA1C" in the last 72 hours. CBG: Recent Labs  Lab 06/27/23 1648  GLUCAP 146*   Lipid Profile: Recent Labs    06/28/23 0256  CHOL 139  HDL 45  LDLCALC 74  TRIG 99  CHOLHDL 3.1   Thyroid  Function Tests: No results for input(s): "TSH", "T4TOTAL", "FREET4", "T3FREE", "THYROIDAB" in the last 72 hours. Anemia Panel: Recent Labs    06/29/23 0317  FERRITIN 471*  TIBC 252  IRON 29   Urine analysis:    Component Value Date/Time   COLORURINE YELLOW 08/02/2020 0947   APPEARANCEUR CLEAR 08/02/2020 0947   LABSPEC 1.009 08/02/2020 0947   PHURINE 5.0 08/02/2020 0947   GLUCOSEU NEGATIVE 08/02/2020 0947   HGBUR SMALL (A) 08/02/2020 0947   BILIRUBINUR NEGATIVE 08/02/2020 0947   KETONESUR NEGATIVE 08/02/2020 0947   PROTEINUR 100 (A) 08/02/2020 0947   NITRITE NEGATIVE 08/02/2020 0947   LEUKOCYTESUR NEGATIVE 08/02/2020 0947   Sepsis Labs: @LABRCNTIP (procalcitonin:4,lacticidven:4)  )No results found for this or any previous visit (from the past 240 hours).   Radiology Studies: DG Chest Port 1 View Result Date: 06/29/2023 CLINICAL DATA:  Shortness of breath EXAM: PORTABLE CHEST 1 VIEW COMPARISON:  06/27/2023 FINDINGS: Heart size and mediastinal contours are stable. Aortic atherosclerosis. Lungs are hypoinflated. Bilateral pleural effusions and interstitial edema are again noted. Compared with the previous exam the pleural effusions appear decreased in the interval. IMPRESSION: Persistent interstitial edema and bilateral pleural effusions compatible with CHF. Pleural effusions appear decreased in the interval. Electronically Signed   By: Kimberley Penman M.D.   On: 06/29/2023 06:09     Scheduled Meds:  amiodarone   50 mg Oral Daily   aspirin  EC  81 mg Oral Daily   bisacodyl   5 mg Oral Once   docusate sodium   100 mg Oral BID   furosemide   60 mg Intravenous Q12H   Continuous  Infusions:   LOS: 3 days    Time spent:    Deforest Fast, MD [facility]   06/30/2023, 4:12 PM

## 2023-06-30 NOTE — Progress Notes (Signed)
 Palliative Medicine Inpatient Follow Up Note HPI: 89 yo female with the past medical history of atrial fibrillation, CKD stage IV, subdural hematoma, hypertension, hyperlipidemia, coronary artery disease and iron deficiency anemia. The Palliative care team has been asked to support additional goals of care conversations in the setting of declining health d/t heart failure.   Today's Discussion 06/30/2023  *Please note that this is a verbal dictation therefore any spelling or grammatical errors are due to the "[employer]agon [employer] One" system interpretation.  Chart reviewed inclusive of vital signs, progress notes, laboratory results, and diagnostic images.   I met with Crystal [name], her niece, Crystal [name] , and her nephew Crystal [name] this morning. We discussed patients present health condition in the setting of her heart failure, AKI on CKD, and hypoxic respiratory failure. Crystal [name] is able to speak more for herself today. She is able to share with me that her goal(s) as of presently are for improvement. We discussed to continue the present scope of care.  Discussed openly and honestly that Crystal [name] has endured a great deal over the past few weeks. Shared in the presence of her severe heart failure I do worry what her long term outlook may be. I discussed if she is able to improve then I do believe she will likely be in a similar position moving forward based upon her poor cardiac function.   We reviewed should Crystal [name] worsen or neglect to improve the option of hospice. I described hospice as a service for patients who have a life expectancy of 6 months or less. The goal of hospice is the preservation of dignity and quality at the end phases of life. Under hospice care, the focus changes from curative to symptom relief. Crystal [name] endorses understanding.  From a symptom perspective Crystal [name] feels great relief with the dilaudid  for her air hunger. She also notes it makes her less anxious.   Created space and opportunity for  patient to explore thoughts feelings and fears regarding current medical situation. She endorses good understanding of her present conditions and if quite cognitively aware. Crystal [name] shares she gets joy out of her great great grand nieces.   Plan will be to allow time for outcomes.  Questions and concerns addressed/Palliative Support Provided.   Objective Assessment: Vital Signs Vitals:   06/30/23 0745 06/30/23 1118  BP: (!) 146/63 (!) 126/56  Pulse: 91 87  Resp: (!) 25 (!) 25  Temp:  (!) 97.5 F (36.4 C)  SpO2: 94% 97%    Intake/Output Summary (Last 24 hours) at 06/30/2023 1220 Last data filed at 06/30/2023 0981 Gross per 24 hour  Intake 237 ml  Output 600 ml  Net -363 ml   Last Weight  Most recent update: 06/30/2023  5:44 AM    Weight  56.2 kg (123 lb 14.4 oz)            Gen: Elderly Caucasian female acutely ill-appearing HEENT: Dry mucous membranes CV: Regular rate and rhythm  PULM: On  8LPM HFNC, breathing is even and nonlabored ABD: soft/nontender  EXT: No edema  Neuro: A+O x3  SUMMARY OF RECOMMENDATIONS   DNAR/DNI   Continue present care allowing time for outcomes   Open and honest conversations held in the setting of patient's multisystem organ dysfunction and likely poor outcomes   Should patient decline discussions related to comfort care and hospice were reviewed   Ongoing palliative care support ______________________________________________________________________________________ [facility] Palliative Medicine Team Team Cell Phone: [phone] Please utilize secure chat with additional questions, if  there is no response within 30 minutes please call the above phone number  Time Spent: 65 Billing based on MDM: High  Palliative Medicine Team providers are available by phone from 7am to 7pm daily and can be reached through the team cell phone.  Should this patient require assistance outside of these hours, please call the patient's  attending physician.

## 2023-06-30 NOTE — Progress Notes (Signed)
 Progress Note   Patient: Crystal [name] YQM:[id number] DOB: May 18, 1934 DOA: 06/27/2023     3 DOS: the patient was seen and examined on 06/30/2023   Brief hospital course: Mrs. Krantz was admitted to the hospital with the working diagnosis of acute heart failure decompensation in the setting of NSTEMI.   89 yo female with the past medical history of atrial fibrillation, CKD stage IV, subdural hematoma, hypertension, hyperlipidemia, coronary artery disease and iron deficiency anemia.    05/13 hospitalized with pneumonia and subdural hematoma due to mechanical fall, she was discharged to SNF.   05/27 transferred from SNF to [facility] with diagnosis of bradycardia, weakness, renal failure and hyperkalemia.  Carvedilol  was discontinued with improvement in heart rate, she received IV fluids with improvement in renal function, with a serum cr from 6.1 down to 3.2 05/30 patient had acute respiratory distress, rapid response called, found with volume overload, troponin went up from 120 to 2800. Cardiology was contacted in [facility] for transfer to tertiary care. Unfortunately no beds available, at [facility]. [facility] accepted patient for transfer.   05/31 She was transferred to from [facility] to [facility], her blood pressure was 131/57, RR 30, HR 85, and 02 saturation 94% on room air.  Lungs with no wheezing or rhonchi, heart with S1 and S2 (prominent S2) present and regular with no gallops, rubs or murmurs, abdomen with no distention, no lower extremity edema.   Na 133, K 5.2 CL 103 bicarbonate 19 glucose 135 bun 38 Cr 2.74  BNP >4,500 High sensitive troponin 1,828 and 1,613  Wbc 13.7 hgb 8.6 plt 452   CT head with suspected trace 1-2 mm subacute subdural hematoma overlying the left frontal convexity. No significant mass effect or midline shift.  Generalized age related atrophy with moderate chronic small vessel ischemic disease with a few remote lacunar infarcts about the left frontal centrum ovale and left cerebellum.    Chest radiograph with right rotation, positive cardiomegaly with bilateral hilar vascular congestion, bilateral central interstitial infiltrates and bilateral moderate pleural effusions, more right than left.   EKG 87 bpm, normal axis, right bundle branch block, qtc 493, sinus rhythm with no significant ST segment or T wave changes.   06/02 patient with worsening dyspnea and persistent volume overload. Poor prognosis.  06/03 patient off bipap, continue with high oxygen  demand, code status changed to DNR. No further escalation of care.   Assessment and Plan: * Acute on chronic systolic CHF (congestive heart failure) (HCC) Echocardiogram with new reduction in LV systolic function down to <20%, global hypokinesis, RV systolic function preserved, RVSP 62.6 mmHg, LA with moderate dilatation, RA with mild dilatation, mild mitral stenosis, moderate to severe tricuspid regurgitation. No pericardial effusion,   Acute on chronic core pulmonale  Patient continue volume overloaded. Documented urine output is 1000 cc  Systolic blood pressure 120 mmHg range.   Continue with furosemide  60 mg IV bid  Limited pharmacologic options due to risk of hypotension and acutely reduced GFR.   Acute hypoxemic respiratory failure  Acute cardiogenic pulmonary edema with bilateral pleural effusions.  06/02 follow up chest radiograph with right rotation, bilateral hilar vascular congestion, cephalization of the vasculature, bilateral central interstitial infiltrates, and bilateral pleural effusions.   02 saturation today 94% on 8 L per HFNC.   Continue diuresis, hold on thoracentesis for now.  Hydromorphone  as needed for severe dyspnea, as palliative measure.   CAD (coronary artery disease) NSTEMI,.   New drop in LV systolic function, no regional  wall motion abnormalities.  No chest pain but positive dyspnea.    Not candidate for anticoagulation due to recent intracranial bleed.  She has a poor physical  functional status.    Essential hypertension Continue blood pressure monitoring  Diuresis as tolerated.   AF (paroxysmal atrial fibrillation) (HCC) Currently sinus rhythm, continue amiodarone .  No B blocker due to bradycardia  No anticoagulation due to recent intracranial bleed.   CKD (chronic kidney disease), stage IV (HCC) Hyponatremia, hyperkalemia,   Renal function with serum cr at 3.0 with K at 4,9 and serum bicarbonate at 19  Na 133   Very poor prognosis, likely not candidate for renal replacement therapy due to advanced heart failure.  Continue diuresis as tolerated.  Type 2 diabetes mellitus (HCC) Continue insulin  sliding scale for glucose cover and monitoring Fasting glucose today 184 mg/dl   Anemia Cell count with hgb at 7,7 and plt at 469  Iron panel with serum iron at 29, TIBC 252, transferrin saturation 12 and transferrin 182, ferritin 471.     Subjective: Patient with no chest pain, continue to have dyspnea but improved from yesterday, she is off bipap this morning. Hydromorphone  has helped with dyspnea.   Physical Exam: Vitals:   06/30/23 0406 06/30/23 0744 06/30/23 0745 06/30/23 1118  BP: 126/60 (!) 146/63 (!) 146/63 (!) 126/56  Pulse: 80 92 91 87  Resp: 17 (!) 25 (!) 25 (!) 25  Temp: 97.8 F (36.6 C) 98 F (36.7 C)  (!) 97.5 F (36.4 C)  TempSrc: Oral Oral  Oral  SpO2: 98% 95% 94% 97%  Weight: 56.2 kg     Height:       Neurology awake and alert, deconditioned and ill looking appearing ENT with mild pallor with no icterus Cardiovascular with S1 and S2 present. Positive S3 gallop, and positive systolic murmur at the apex,  Positive JVD Positive lower extremity edema ++ Respiratory with decreased inspiratory effort, decreased breath sounds at bases, with no wheezing Abdomen with no distention   Data Reviewed:    Family Communication: I spoke with patient's nice and brother at the bedside, we talked in detail about patient's condition, plan of  care and prognosis and all questions were addressed.   Disposition: Status is: Inpatient Remains inpatient appropriate because: respiratory and heart failure   Planned Discharge Destination: to be determined    Author: Albertus Alt, MD 06/30/2023 1:46 PM  For on call review [email or web address].

## 2023-06-30 NOTE — Progress Notes (Signed)
 Heart Failure Navigator Progress Note  Assessed for Heart & Vascular TOC clinic readiness.  Patient does not meet criteria due to . Per Dr. Sunnie England - more palliative approach with poor prognosis, No HF TOC.    Navigator will sign off at this time.   Randie Bustle, BSN, Scientist, clinical (histocompatibility and immunogenetics) Only

## 2023-07-01 DIAGNOSIS — Z515 Encounter for palliative care: Secondary | ICD-10-CM | POA: Diagnosis not present

## 2023-07-01 DIAGNOSIS — I5023 Acute on chronic systolic (congestive) heart failure: Secondary | ICD-10-CM | POA: Diagnosis not present

## 2023-07-01 LAB — BASIC METABOLIC PANEL WITH GFR
Anion gap: 12 (ref 5–15)
BUN: 57 mg/dL — ABNORMAL HIGH (ref 8–23)
CO2: 23 mmol/L (ref 22–32)
Calcium: 8.8 mg/dL — ABNORMAL LOW (ref 8.9–10.3)
Chloride: 99 mmol/L (ref 98–111)
Creatinine, Ser: 2.91 mg/dL — ABNORMAL HIGH (ref 0.44–1.00)
GFR, Estimated: 15 mL/min — ABNORMAL LOW (ref >60)
Glucose, Bld: 129 mg/dL — ABNORMAL HIGH (ref 70–99)
Potassium: 4.7 mmol/L (ref 3.5–5.1)
Sodium: 134 mmol/L — ABNORMAL LOW (ref 135–145)

## 2023-07-01 LAB — MAGNESIUM: Magnesium: 2.2 mg/dL (ref 1.7–2.4)

## 2023-07-01 MED ORDER — ACETAMINOPHEN 500 MG PO TABS
500.0000 mg | ORAL_TABLET | Freq: Three times a day (TID) | ORAL | Status: DC
  Administered 2023-07-01 – 2023-07-02 (×2): 500 mg via ORAL
  Filled 2023-07-01 (×3): qty 1

## 2023-07-01 MED ORDER — SENNOSIDES-DOCUSATE SODIUM 8.6-50 MG PO TABS
1.0000 | ORAL_TABLET | Freq: Two times a day (BID) | ORAL | Status: DC
  Administered 2023-07-01 – 2023-07-02 (×3): 1 via ORAL
  Filled 2023-07-01 (×3): qty 1

## 2023-07-01 NOTE — Plan of Care (Signed)
   Problem: Coping: Goal: Level of anxiety will decrease Outcome: Progressing

## 2023-07-01 NOTE — TOC Progression Note (Signed)
 Transition of Care [facility]) - Progression Note    Patient Details  Name: Crystal [name] MRN: [id number] Date of Birth: 20-Apr-1934  Transition of [facility]) CM/SW Contact  Omie Bickers, RN Phone Number: 07/01/2023, 1:49 PM  Clinical Narrative:     Crystal [name] w patient at bedside. She deferred assessment to her niece.  Notified by palliative that they would like to DC on home hospice with [facility]).  Spoke w niece and confirmed choice. Patient will need home oxygen , hospital bed delivered before discharge and will need transportation through [place].  Spoke with Adah Hollering at [place] and provided with referral. She will contact niece and process orders through [facility] DME and hospice provider.  Anticipate DC to home, address verified in chart, tomorrow with [facility] and [facility].   Expected Discharge Plan: Home w Hospice Care Barriers to Discharge: Continued Medical Work up  Expected Discharge Plan and Services   Discharge Planning Services: CM Consult   Living arrangements for the past 2 months: Single Family Home                             [facility] Agency: [facility] Date [facility] Agency Contacted: 07/01/23 Time HH Agency Contacted: 1348 Representative spoke with at [facility] Agency: Adah Hollering   Social Determinants of Health (SDOH) Interventions SDOH Screenings   Food Insecurity: No Food Insecurity (06/27/2023)  Housing: Low Risk  (06/27/2023)  Transportation Needs: No Transportation Needs (06/27/2023)  Utilities: Not At Risk (06/27/2023)  Alcohol  Screen: Low Risk  (05/01/2020)  Depression (PHQ2-9): Low Risk  (11/15/2020)  Financial Resource Strain: Low Risk  (06/24/2023)   Received from [facility]  Physical Activity: Insufficiently Active (06/24/2023)   Received from [facility]  Social Connections: Moderately Isolated (06/27/2023)  Stress: Stress Concern Present (06/24/2023)   Received from [facility]  Tobacco Use: Low Risk  (06/25/2023)   Received from  [facility] Literacy: Low Risk  (06/24/2023)   Received from [facility]    Readmission Risk Interventions    10/01/2021    8:08 AM  Readmission Risk Prevention Plan  Transportation Screening Complete  HRI or Home Care Consult Complete  Social Work Consult for Recovery Care Planning/Counseling Complete  Palliative Care Screening Not Applicable  Medication Review Oceanographer) Complete

## 2023-07-01 NOTE — Progress Notes (Addendum)
   Palliative Medicine Inpatient Follow Up Note HPI: 89 yo female with the past medical history of atrial fibrillation, CKD stage IV, subdural hematoma, hypertension, hyperlipidemia, coronary artery disease and iron deficiency anemia. The Palliative care team has been asked to support additional goals of care conversations in the setting of declining health d/t heart failure.   Today's Discussion 07/01/2023  *Please note that this is a verbal dictation therefore any spelling or grammatical errors are due to the "Dragon [employer] One" system interpretation.  Chart reviewed inclusive of vital signs, progress notes, laboratory results, and diagnostic images.   I met with Shelina's niece, [place]  today. WE discussed that she and her brother would ideally like to bring [place] home with hospice under the guise that she has an incurable illness - heart failure. This too was recommended by the prior provider(s). Patients niece prefers [facility] hospice as they have worked with this company for prior loved ones.  I was able to meet with Monroe [place] at bedside and confirm with her the goal for her to be optimized medically and transition back to home on hospice. She notes that she would be more comfortable going home though worries that she may be a burden. It was emphasized that her family love her and want to support her during this time. Sharlisa endorses the joy she gets out of being around her family.   Questions and concerns addressed/Palliative Support Provided.   Objective Assessment: Vital Signs Vitals:   07/01/23 0710 07/01/23 1108  BP: 136/67 135/63  Pulse: 80 81  Resp: 18 19  Temp: (!) 97.5 F (36.4 C) (!) 97.5 F (36.4 C)  SpO2: 93% 97%    Intake/Output Summary (Last 24 hours) at 07/01/2023 1224 Last data filed at 06/30/2023 1743 Gross per 24 hour  Intake --  Output 300 ml  Net -300 ml   Last Weight  Most recent update: 07/01/2023  5:28 AM    Weight  55.7 kg (122 lb 12.7 oz)             Gen: Elderly Caucasian female acutely ill-appearing HEENT: Dry mucous membranes CV: Regular rate and rhythm  PULM: On  8LPM HFNC, breathing is even and nonlabored ABD: soft/nontender  EXT: No edema  Neuro: A+O x3  SUMMARY OF RECOMMENDATIONS   DNAR/DNI   Continue present care for patient to be medically optimized prior to transition home   Plan for transition to home with [facility] TOC involved to support this arrangement   Ongoing palliative care support ___________________  Lower back pain: - Tylenol  500mg  PO TID  Constipation: - Senna 1 Tab PO BID  Air Hunger: - Dilaudid  0.5-1mg  IV Q2H PRN - when transitioning home can do 2-3mg  orally Q2H PRN ______________________________________________________________________________________ Camille Ced[place] Palliative Medicine Team Team Cell Phone: [phone] Please utilize secure chat with additional questions, if there is no response within 30 minutes please call the above phone number  Billing based on MDM: High  Palliative Medicine Team providers are available by phone from 7am to 7pm daily and can be reached through the team cell phone.  Should this patient require assistance outside of these hours, please call the patient's attending physician.

## 2023-07-01 NOTE — Evaluation (Signed)
 Physical Therapy Evaluation Patient Details Name: Crystal [name] MRN: [id number] DOB: [birth year] Today's Date: 07/01/2023  History of Present Illness  Pt is a 89 y.o. female admitted 06/27/23 for NSTEMI and CHF. CXR demonstrated persistent interstitial edema and bilateral pleural effusions. PMH of atrial fibrillation, CKD stage IV, subdural hematoma, hypertension, hyperlipidemia, coronary artery disease, and iron deficiency anemia. Of note, recent hospitalizations for falls - 5/10 found to have a mild T12 compression fx and was d/c home; fell again and was readmitted 5/13 and found to have a SDH and R greater trochanter fx and was d/c'd to SNF.   Clinical Impression  Pt admitted with above diagnosis. PTA, pt needed assistance with functional mobility and ADLs. She was residing at a SNF receiving therapy services. Pt is a limited historian and no family was present to confirm PLOF and home set-up. She reports she hasn't been OOB since 5/13. Pt currently with functional limitations due to the deficits listed below (see PT Problem List). Examination limited by pain, fatigue, and anxiety. She required +2 assist for bed mobility, educated her on log roll technique. Pt attempted to stand with modA x2, 2 person HHA, and demonstrated minimal hip clearance. Pt will benefit from acute skilled PT to maximize her independence and safety with mobility as well as reduce caregiver burden to allow discharge home with HHPT.     If plan is discharge home, recommend the following: Two people to help with walking and/or transfers;Two people to help with bathing/dressing/bathroom;Assistance with cooking/housework;Direct supervision/assist for medications management;Assist for transportation;Help with stairs or ramp for entrance   Can travel by private vehicle        Equipment Recommendations Hospital bed;Wheelchair (measurements PT)  Recommendations for Other Services       Functional Status Assessment Patient  has had a recent decline in their functional status and/or demonstrates limited ability to make significant improvements in function in a reasonable and predictable amount of time     Precautions / Restrictions Precautions Precautions: Fall Recall of Precautions/Restrictions: Impaired Precaution/Restrictions Comments: watch SpO2; no O2 at baseline Required Braces or Orthoses: Spinal Brace Spinal Brace: Thoracolumbosacral orthotic;Applied in sitting position (Per [facility] Neurosurgery note 5/13, pt to wear TLSO when OOB) Restrictions Weight Bearing Restrictions Per Provider Order: Yes RLE Weight Bearing Per Provider Order: Weight bearing as tolerated (Per [facility] Ortho note 5/15)      Mobility  Bed Mobility Overal bed mobility: Needs Assistance Bed Mobility: Sidelying to Sit, Rolling, Sit to Sidelying Rolling: Min assist, +2 for physical assistance, +2 for safety/equipment Sidelying to sit: Min assist, +2 for physical assistance, +2 for safety/equipment     Sit to sidelying: Min assist, +2 for physical assistance, +2 for safety/equipment General bed mobility comments: Educated pt on log roll technique and provided VC for sequencing throughout tasks. She needed light assist to bring BLE off EOB, elevate trunk, and scoot fwd til feet flat. Returning to bed required assist to bring BLE back into bed. Repositioned using bed features and +2 assist.    Transfers Overall transfer level: Needs assistance Equipment used: 2 person hand held assist Transfers: Sit to/from Stand Sit to Stand: Mod assist, +2 physical assistance, +2 safety/equipment           General transfer comment: Pt attempted to stand from lowest bed height with HHA. She required modA to power up and demonstrated limited hip clearence and lacked hip extension remaining in ~20deg of hip flex. Pt quickly fatigued and returned to sitting on EOB. She  scooted backwards and then return to bed.    Ambulation/Gait                   Stairs            Wheelchair Mobility     Tilt Bed    Modified Rankin (Stroke Patients Only)       Balance Overall balance assessment: History of Falls, Needs assistance Sitting-balance support: Feet supported, No upper extremity supported Sitting balance-Leahy Scale: Fair Sitting balance - Comments: Pt sat EOB with supervision.   Standing balance support: Bilateral upper extremity supported, During functional activity Standing balance-Leahy Scale: Poor Standing balance comment: Pt required modA x2 to attempt to stand and was unable to obtain erect upright posture.                             Pertinent Vitals/Pain Pain Assessment Pain Assessment: 0-10 Pain Score: 7  Pain Location: back Pain Descriptors / Indicators: Aching, Discomfort, Guarding, Grimacing Pain Intervention(s): Monitored during session, Limited activity within patient's tolerance, Repositioned, Patient requesting pain meds-RN notified    Home Living Family/patient expects to be discharged to:: Private residence Living Arrangements: Other relatives (Niece and her family) Available Help at Discharge: Family;Available 24 hours/day Type of Home: House Home Access: Stairs to enter Entrance Stairs-Rails: None Entrance Stairs-Number of Steps: 3   Home Layout: One level Home Equipment: Agricultural consultant (2 wheels);Tub bench;BSC/3in1      Prior Function Prior Level of Function : Patient poor historian/Family not available;Independent/Modified Independent;History of Falls (last six months)             Mobility Comments: Prior to falls she was ambulating without an AD. 2 falls one 5/10 resulting in T12 compression fx and one 5/13 resulting in SDH and R greater troch fx. Pt reports she hasn't been up and moving since her falls and was given a brace that was uncomfortable and didn't fit properly. She states she was given some exercises to complete in the bed. ADLs Comments: Prior to falls  she was independent with ADLs. Since the 2 falls she reports not getting out of bed, but assisting in moving around and lifting/scooting her body. She hasn't been OOB as she doesn't have a proper fitting back brace.     Extremity/Trunk Assessment   Upper Extremity Assessment Upper Extremity Assessment: Defer to OT evaluation    Lower Extremity Assessment Lower Extremity Assessment: Generalized weakness    Cervical / Trunk Assessment Cervical / Trunk Assessment: Kyphotic  Communication   Communication Communication: No apparent difficulties    Cognition Arousal: Alert Behavior During Therapy: Anxious   PT - Cognitive impairments: No apparent impairments                       PT - Cognition Comments: Pt A,Ox4 Following commands: Intact       Cueing Cueing Techniques: Verbal cues, Gestural cues     General Comments General comments (skin integrity, edema, etc.): BP: start of session 139/65 (88) and end of session 146/69 (91). HR 89. SpO2 >90% on 8L O2 via HFNC.    Exercises     Assessment/Plan    PT Assessment Patient needs continued PT services  PT Problem List Decreased strength;Decreased activity tolerance;Decreased balance;Decreased mobility;Pain;Cardiopulmonary status limiting activity       PT Treatment Interventions DME instruction;Gait training;Stair training;Functional mobility training;Therapeutic activities;Therapeutic exercise;Balance training;Patient/family education    PT Goals (Current  goals can be found in the Care Plan section)  Acute Rehab PT Goals Patient Stated Goal: Return Home and become more comfortable PT Goal Formulation: With patient Time For Goal Achievement: 07/15/23 Potential to Achieve Goals: Fair    Frequency Min 1X/week     Co-evaluation PT/OT/SLP Co-Evaluation/Treatment: Yes Reason for Co-Treatment: For patient/therapist safety;To address functional/ADL transfers PT goals addressed during session: Mobility/safety  with mobility;Balance         AM-PAC PT "6 Clicks" Mobility  Outcome Measure Help needed turning from your back to your side while in a flat bed without using bedrails?: Total Help needed moving from lying on your back to sitting on the side of a flat bed without using bedrails?: Total Help needed moving to and from a bed to a chair (including a wheelchair)?: Total Help needed standing up from a chair using your arms (e.g., wheelchair or bedside chair)?: Total Help needed to walk in hospital room?: Total Help needed climbing 3-5 steps with a railing? : Total 6 Click Score: 6    End of Session Equipment Utilized During Treatment: Oxygen  Activity Tolerance: Patient limited by fatigue;Patient limited by pain Patient left: in bed;with call bell/phone within reach;with bed alarm set Nurse Communication: Mobility status PT Visit Diagnosis: Pain;Muscle weakness (generalized) (M62.81);Difficulty in walking, not elsewhere classified (R26.2);Unsteadiness on feet (R26.81);Other abnormalities of gait and mobility (R26.89) Pain - Right/Left: Right Pain - part of body:  (Back)    Time: 6213-0865 PT Time Calculation (min) (ACUTE ONLY): 25 min   Charges:   PT Evaluation $PT Eval Moderate Complexity: 1 Mod   PT General Charges $$ ACUTE PT VISIT: 1 Visit         Glenford Lanes, PT, DPT Acute Rehabilitation Services Office: [phone] Secure Chat Preferred  Riva Chester 07/01/2023, 4:13 PM

## 2023-07-01 NOTE — Progress Notes (Signed)
 Orthopedic Tech Progress Note Patient Details:  Crystal [name] 01/02/1935 [id number] Medium size TLSO brace and extension panels were delivered to bedside.  Ortho Devices Type of Ortho Device: Thoracolumbar corset (TLSO) Ortho Device/Splint Interventions: Ordered      [name] E Commodore Bellew 07/01/2023, 5:47 PM

## 2023-07-01 NOTE — Progress Notes (Signed)
 Speech Language Pathology Treatment: Dysphagia  Patient Details Name: Crystal [name] MRN: [id number] DOB: July 26, 1934 Today's Date: 07/01/2023 Time: 4098-1191 SLP Time Calculation (min) (ACUTE ONLY): 12 min  Assessment / Plan / Recommendation Clinical Impression  Pt is calm today with stable respirations. She reports she is not having the jaw pain when chewing as she was on admission. Suspecting she may have esophageal involvement as she reports fairly frequent globus sensation in chest when she eats and stated she felt as if food wasn't "going all the way down" after po consumption during session. Sometimes reports feeling of food coming back up. There was frequent eructation on initial assessment. She states she was taken off her Protonix  because "it wasn't good for her bones." There was no concern with oropharyngeal swallow/mastication when observed with straw sips water  and regular texture. She takes small pills with water  and larger ones cut in half in applesauce. She has been ordering mostly broths, fruit to eat. SLP reviewed strategies to mitigate esophageal involvement and advised her if symptoms worsen to discuss with physician for further testing if necessary/GI consult. Continue regular texture, thin liquids and ST will sign off at this time.    HPI HPI: 89 y.o. female with CAD s/p DES to LAD (2021), DES RCA (12/2019), patent stents on coronary angiography (01/2020), paroxysmal AF, 2' hypoparathyroidism, HLD, HTN and CKD4 who is being seen for the evaluation of NSTEMI at the request of Dr. Dixie Frederickson Diagnosed with acute on chronic systolis hear failure. Pt notified RN of increassing jaw pain (leads to difficulty chewing her meals and difficulty swallowing "for awhile." CXR Persistent interstitial edema and bilateral pleural effusions  compatible with CHF. Pleural effusions appear decreased in the interval. BSE 01/2020 some difficulty swallowing pills. Reg/thin recommended.      SLP Plan   All goals met;Discharge SLP treatment due to (comment)      Recommendations for follow up therapy are one component of a multi-disciplinary discharge planning process, led by the attending physician.  Recommendations may be updated based on patient status, additional functional criteria and insurance authorization.    Recommendations  Diet recommendations: Regular;Thin liquid Liquids provided via: Cup;Straw Medication Administration: Whole meds with liquid (staff cuts larger pills in half with applesauce) Supervision: Patient able to self feed Compensations: Slow rate;Small sips/bites;Follow solids with liquid Postural Changes and/or Swallow Maneuvers: Seated upright 90 degrees;Upright 30-60 min after meal                  Oral care BID   Set up Supervision/Assistance Dysphagia, unspecified (R13.10)     All goals met;Discharge SLP treatment due to (comment)     Crystal [name]  07/01/2023, 10:15 AM

## 2023-07-01 NOTE — Plan of Care (Signed)
   Problem: Health Behavior/Discharge Planning: Goal: Ability to manage health-related needs will improve Outcome: Progressing

## 2023-07-01 NOTE — Evaluation (Addendum)
 Occupational Therapy Evaluation Patient Details Name: Crystal [name] MRN: [id number] DOB: October 14, 1934 Today's Date: 07/01/2023   History of Present Illness   Pt is a 89 y.o. female admitted 06/27/23 for NSTEMI and CHF. CXR demonstrated persistent interstitial edema and bilateral pleural effusions. PMH of atrial fibrillation, CKD stage IV, subdural hematoma, hypertension, hyperlipidemia, coronary artery disease, and iron deficiency anemia. Of note, recent hospitalizations for falls - 5/10 found to have a mild T12 compression fx and was d/c home. [facility] again and was readmitted 5/13 and found to have a SDH and R greater trochanter fx and was d/c'd to SNF.     Clinical Impressions Pt reports prior to hospital admissions in early May for falls, pt was ind with ADLs and mobility, since falls she has not been OOB except at [facility].  Pt currently needing up to max A for ADLs, min +2 for bed mobility and mod +2 for standing attempts, however pt minimally able to clear hips. Pt anxious with all pain/mobility, VSS on 8L O2 throughout. Per chart review, pt was to be refitted for TLSO brace for T12 fx, MD notified. Pt presenting with impairments listed below, will follow acutely. Recommend HHOT at d/c.     If plan is discharge home, recommend the following:   A lot of help with walking and/or transfers;A lot of help with bathing/dressing/bathroom;Assistance with cooking/housework;Direct supervision/assist for medications management;Direct supervision/assist for financial management;Assist for transportation;Help with stairs or ramp for entrance     Functional Status Assessment   Patient has had a recent decline in their functional status and demonstrates the ability to make significant improvements in function in a reasonable and predictable amount of time.     Equipment Recommendations   Hospital bed;Wheelchair (measurements OT);Wheelchair cushion (measurements OT)     Recommendations for Other  Services   PT consult     Precautions/Restrictions   Precautions Precautions: Fall;Back Recall of Precautions/Restrictions: Impaired Precaution/Restrictions Comments: watch SpO2; no O2 at baseline Required Braces or Orthoses: Spinal Brace Spinal Brace: Thoracolumbosacral orthotic;Applied in sitting position (Per [facility] note 5/13, pt to wear TLSO when OOB) -- no TLSO present on eval, MD notified. Restrictions Weight Bearing Restrictions Per Provider Order: Yes RLE Weight Bearing Per Provider Order: Weight bearing as tolerated (Per [facility] note 5/15)     Mobility Bed Mobility Overal bed mobility: Needs Assistance Bed Mobility: Sidelying to Sit, Rolling, Sit to Sidelying Rolling: Min assist, +2 for physical assistance, +2 for safety/equipment Sidelying to sit: Min assist, +2 for physical assistance, +2 for safety/equipment     Sit to sidelying: Min assist, +2 for physical assistance, +2 for safety/equipment General bed mobility comments: via log roll    Transfers Overall transfer level: Needs assistance Equipment used: 2 person hand held assist Transfers: Sit to/from Stand Sit to Stand: Mod assist, +2 physical assistance, +2 safety/equipment           General transfer comment: minimal hip clearance from EOB to adjust bed pad      Balance Overall balance assessment: History of Falls, Needs assistance Sitting-balance support: Feet supported, No upper extremity supported Sitting balance-Leahy Scale: Fair Sitting balance - Comments: Pt sat EOB with supervision.   Standing balance support: Bilateral upper extremity supported, During functional activity Standing balance-Leahy Scale: Poor Standing balance comment: Pt required modA x2 to attempt to stand and was unable to obtain erect upright posture.  ADL either performed or assessed with clinical judgement   ADL Overall ADL's : Needs  assistance/impaired Eating/Feeding: Set up;Sitting;Bed level   Grooming: Minimal assistance;Bed level;Sitting   Upper Body Bathing: Moderate assistance;Bed level;Sitting   Lower Body Bathing: Moderate assistance;Bed level;Sitting/lateral leans   Upper Body Dressing : Moderate assistance;Sitting;Bed level   Lower Body Dressing: Moderate assistance;Sitting/lateral leans;Bed level   Toilet Transfer: Moderate assistance;+2 for physical assistance   Toileting- Clothing Manipulation and Hygiene: Maximal assistance       Functional mobility during ADLs: Moderate assistance;+2 for physical assistance       Vision   Vision Assessment?: No apparent visual deficits     Perception Perception: Not tested       Praxis Praxis: Not tested       Pertinent Vitals/Pain Pain Assessment Pain Assessment: Faces Pain Score: 7  Faces Pain Scale: Hurts even more Pain Location: back Pain Descriptors / Indicators: Aching, Discomfort, Guarding, Grimacing Pain Intervention(s): Limited activity within patient's tolerance, Monitored during session, Repositioned     Extremity/Trunk Assessment Upper Extremity Assessment Upper Extremity Assessment: Generalized weakness   Lower Extremity Assessment Lower Extremity Assessment: Defer to PT evaluation   Cervical / Trunk Assessment Cervical / Trunk Assessment: Kyphotic   Communication Communication Communication: No apparent difficulties   Cognition Arousal: Alert Behavior During Therapy: Anxious               OT - Cognition Comments: anxious with mobility, pain, breating/O2 sats etc                 Following commands: Intact       Cueing  General Comments   Cueing Techniques: Verbal cues;Gestural cues  VSS on 8L O2 HFNC   Exercises     Shoulder Instructions      Home Living Family/patient expects to be discharged to:: Private residence Living Arrangements: Other relatives (niece and family) Available Help at  Discharge: Family;Available 24 hours/day Type of Home: House Home Access: Stairs to enter [employer] of Steps: 3 Entrance Stairs-Rails: None Home Layout: One level     Bathroom Shower/Tub: Chief Strategy Officer: Standard     Home Equipment: Agricultural consultant (2 wheels);Tub bench;BSC/3in1          Prior Functioning/Environment Prior Level of Function : Patient poor historian/Family not available;Independent/Modified Independent;History of Falls (last six months)             Mobility Comments: Prior to falls she was ambulating without an AD. 2 falls one 5/10 resulting in T12 compression fx and one 5/13 resulting in SDH and R greater troch fx. Pt reports she hasn't been up and moving since her falls and was given a brace that was uncomfortable and didn't fit properly. She states she was given some exercises to complete in the bed. ADLs Comments: Prior to falls she was independent with ADLs. Since the 2 falls she reports not getting out of bed, but assisting in moving around and lifting/scooting her body. She hasn't been OOB as she doesn't have a proper fitting back brace.    OT Problem List: Decreased range of motion;Decreased strength;Decreased activity tolerance;Impaired balance (sitting and/or standing);Decreased cognition;Decreased safety awareness   OT Treatment/Interventions: Self-care/ADL training;Therapeutic exercise;Energy conservation;DME and/or AE instruction;Therapeutic activities;Balance training;Patient/family education      OT Goals(Current goals can be found in the care plan section)   Acute Rehab OT Goals Patient Stated Goal: to get pain meds OT Goal Formulation: With patient Time For Goal Achievement: 07/15/23 Potential  to Achieve Goals: Good ADL Goals Pt Will Perform Upper Body Dressing: with caregiver independent in assisting;sitting Pt Will Perform Lower Body Dressing: with caregiver independent in assisting;sitting/lateral leans;sit  to/from stand Pt Will Transfer to Toilet: with min assist;stand pivot transfer;bedside commode Additional ADL Goal #1: pt will perform bed mobility with supervision in prep for seated ADLs   OT Frequency:  Min 1X/week    Co-evaluation PT/OT/SLP Co-Evaluation/Treatment: Yes Reason for Co-Treatment: For patient/therapist safety;To address functional/ADL transfers PT goals addressed during session: Mobility/safety with mobility;Balance OT goals addressed during session: ADL's and self-care;Strengthening/ROM      AM-PAC OT "6 Clicks" Daily Activity     Outcome Measure Help from another person eating meals?: A Little Help from another person taking care of personal grooming?: A Little Help from another person toileting, which includes using toliet, bedpan, or urinal?: A Lot Help from another person bathing (including washing, rinsing, drying)?: A Lot Help from another person to put on and taking off regular upper body clothing?: A Lot Help from another person to put on and taking off regular lower body clothing?: A Lot 6 Click Score: 14   End of Session Equipment Utilized During Treatment: Oxygen  (8L) Nurse Communication: Mobility status;Patient requests pain meds  Activity Tolerance: Patient tolerated treatment well Patient left: in bed;with call bell/phone within reach;with bed alarm set  OT Visit Diagnosis: Unsteadiness on feet (R26.81);Other abnormalities of gait and mobility (R26.89);Muscle weakness (generalized) (M62.81)                Time: 1610-9604 OT Time Calculation (min): 25 min Charges:  OT General Charges $OT Visit: 1 Visit OT Evaluation $OT Eval Moderate Complexity: 1 Mod  Chae Shuster K, OTD, OTR/L SecureChat Preferred Acute Rehab [phone]   Benedict Brain Koonce 07/01/2023, 4:36 PM

## 2023-07-01 NOTE — Progress Notes (Signed)
 Patient has no c/o being short of breath at this time. Patient wearing oxygen  set at 5lpm with sp02=100%.

## 2023-07-02 ENCOUNTER — Other Ambulatory Visit (HOSPITAL_COMMUNITY): Payer: Self-pay

## 2023-07-02 ENCOUNTER — Encounter: Payer: Self-pay | Admitting: Hematology

## 2023-07-02 DIAGNOSIS — I5023 Acute on chronic systolic (congestive) heart failure: Secondary | ICD-10-CM | POA: Diagnosis not present

## 2023-07-02 MED ORDER — TORSEMIDE 20 MG PO TABS
20.0000 mg | ORAL_TABLET | Freq: Every day | ORAL | 1 refills | Status: AC
  Filled 2023-07-02: qty 30, 30d supply, fill #0

## 2023-07-02 MED ORDER — SENNOSIDES-DOCUSATE SODIUM 8.6-50 MG PO TABS
1.0000 | ORAL_TABLET | Freq: Two times a day (BID) | ORAL | 0 refills | Status: DC
  Filled 2023-07-02: qty 20, 10d supply, fill #0

## 2023-07-02 MED ORDER — POLYETHYLENE GLYCOL 3350 17 GM/SCOOP PO POWD
17.0000 g | Freq: Every day | ORAL | 0 refills | Status: AC | PRN
  Filled 2023-07-02: qty 238, 14d supply, fill #0

## 2023-07-02 NOTE — Progress Notes (Signed)
 RN went over discharge instructions with Virginia , niece via phone call. RN educated niece on medication changes and follow up appointments. RN made Virginia  aware of patient belongings being sent (phone, charger, briefs, back brace and TOC meds). PIV and tele removed per RN. CCMD notifed of discharge.

## 2023-07-02 NOTE — TOC Transition Note (Addendum)
 Transition of Care [facility]) - Discharge Note   Patient Details  Name: Crystal [name] MRN: [id number] Date of Birth: [birth year]  [facility] CM/SW Contact:  Cosimo Diones, RN Phone Number: 07/02/2023, 11:19 AM   Clinical Narrative:  Case Manager received notification that the patient will transition home with Hospice. Case manager has called niece Ramsey,Trina and she states [employer] has delivered DME HB and oxygen . Patient will need [facility] transport home. Verified that family is there to receive the patient. No further needs identified at this time.   1129 07-02-23 Voicemail left with [place] liaison for [facility] and the office is aware that the patient will transition home today. Case Manager called [facility] for travel and ETA is within the next 30 minutes. Staff RN and family are aware. No further needs identified.  Final next level of care: Home w Hospice Care Barriers to Discharge: No Barriers Identified   Patient Goals and CMS Choice Patient states their goals for this hospitalization and ongoing recovery are:: to go home w hospice CMS Medicare.gov Compare Post Acute Care list provided to:: Other (Comment Required) Choice offered to / list presented to : Patient   Discharge Plan and Services Additional resources added to the After Visit Summary for     Discharge Planning Services: CM Consult  [facility] Agency: [facility] Date [facility] Agency Contacted: 07/01/23 Time HH Agency Contacted: 1348 Representative spoke with at [facility] Agency: Adah Hollering  Social Drivers of Health (SDOH) Interventions SDOH Screenings   Food Insecurity: No Food Insecurity (06/27/2023)  Housing: Low Risk  (06/27/2023)  Transportation Needs: No Transportation Needs (06/27/2023)  Utilities: Not At Risk (06/27/2023)  Alcohol  Screen: Low Risk  (05/01/2020)  Depression (PHQ2-9): Low Risk  (11/15/2020)  Financial Resource Strain: Low Risk  (06/24/2023)   Received from [facility]   Physical Activity: Insufficiently Active (06/24/2023)   Received from [facility]  Social Connections: Moderately Isolated (06/27/2023)  Stress: Stress Concern Present (06/24/2023)   Received from [facility]  Tobacco Use: Low Risk  (06/25/2023)   Received from [facility] Literacy: Low Risk  (06/24/2023)   Received from [facility]     Readmission Risk Interventions    10/01/2021    8:08 AM  Readmission Risk Prevention Plan  Transportation Screening Complete  HRI or Home Care Consult Complete  Social Work Consult for Recovery Care Planning/Counseling Complete  Palliative Care Screening Not Applicable  Medication Review Oceanographer) Complete

## 2023-07-03 DIAGNOSIS — D509 Iron deficiency anemia, unspecified: Secondary | ICD-10-CM | POA: Diagnosis not present

## 2023-07-03 DIAGNOSIS — E209 Hypoparathyroidism, unspecified: Secondary | ICD-10-CM | POA: Diagnosis not present

## 2023-07-03 DIAGNOSIS — Z853 Personal history of malignant neoplasm of breast: Secondary | ICD-10-CM | POA: Diagnosis not present

## 2023-07-03 DIAGNOSIS — I5022 Chronic systolic (congestive) heart failure: Secondary | ICD-10-CM | POA: Diagnosis not present

## 2023-07-03 DIAGNOSIS — I519 Heart disease, unspecified: Secondary | ICD-10-CM | POA: Diagnosis not present

## 2023-07-03 DIAGNOSIS — I10 Essential (primary) hypertension: Secondary | ICD-10-CM | POA: Diagnosis not present

## 2023-07-03 DIAGNOSIS — I480 Paroxysmal atrial fibrillation: Secondary | ICD-10-CM | POA: Diagnosis not present

## 2023-07-03 DIAGNOSIS — N184 Chronic kidney disease, stage 4 (severe): Secondary | ICD-10-CM | POA: Diagnosis not present

## 2023-07-03 DIAGNOSIS — K219 Gastro-esophageal reflux disease without esophagitis: Secondary | ICD-10-CM | POA: Diagnosis not present

## 2023-07-03 DIAGNOSIS — E118 Type 2 diabetes mellitus with unspecified complications: Secondary | ICD-10-CM | POA: Diagnosis not present

## 2023-07-03 DIAGNOSIS — M109 Gout, unspecified: Secondary | ICD-10-CM | POA: Diagnosis not present

## 2023-07-03 DIAGNOSIS — I214 Non-ST elevation (NSTEMI) myocardial infarction: Secondary | ICD-10-CM | POA: Diagnosis not present

## 2023-07-03 DIAGNOSIS — E785 Hyperlipidemia, unspecified: Secondary | ICD-10-CM | POA: Diagnosis not present

## 2023-07-04 DIAGNOSIS — N184 Chronic kidney disease, stage 4 (severe): Secondary | ICD-10-CM | POA: Diagnosis not present

## 2023-07-04 DIAGNOSIS — I480 Paroxysmal atrial fibrillation: Secondary | ICD-10-CM | POA: Diagnosis not present

## 2023-07-04 DIAGNOSIS — D509 Iron deficiency anemia, unspecified: Secondary | ICD-10-CM | POA: Diagnosis not present

## 2023-07-04 DIAGNOSIS — E785 Hyperlipidemia, unspecified: Secondary | ICD-10-CM | POA: Diagnosis not present

## 2023-07-04 DIAGNOSIS — I5022 Chronic systolic (congestive) heart failure: Secondary | ICD-10-CM | POA: Diagnosis not present

## 2023-07-04 DIAGNOSIS — I10 Essential (primary) hypertension: Secondary | ICD-10-CM | POA: Diagnosis not present

## 2023-07-06 DIAGNOSIS — I480 Paroxysmal atrial fibrillation: Secondary | ICD-10-CM | POA: Diagnosis not present

## 2023-07-06 DIAGNOSIS — I10 Essential (primary) hypertension: Secondary | ICD-10-CM | POA: Diagnosis not present

## 2023-07-06 DIAGNOSIS — E785 Hyperlipidemia, unspecified: Secondary | ICD-10-CM | POA: Diagnosis not present

## 2023-07-06 DIAGNOSIS — I5022 Chronic systolic (congestive) heart failure: Secondary | ICD-10-CM | POA: Diagnosis not present

## 2023-07-06 DIAGNOSIS — N184 Chronic kidney disease, stage 4 (severe): Secondary | ICD-10-CM | POA: Diagnosis not present

## 2023-07-06 DIAGNOSIS — Z09 Encounter for follow-up examination after completed treatment for conditions other than malignant neoplasm: Secondary | ICD-10-CM | POA: Diagnosis not present

## 2023-07-06 DIAGNOSIS — D509 Iron deficiency anemia, unspecified: Secondary | ICD-10-CM | POA: Diagnosis not present

## 2023-07-07 DIAGNOSIS — I5022 Chronic systolic (congestive) heart failure: Secondary | ICD-10-CM | POA: Diagnosis not present

## 2023-07-07 DIAGNOSIS — I10 Essential (primary) hypertension: Secondary | ICD-10-CM | POA: Diagnosis not present

## 2023-07-07 DIAGNOSIS — E785 Hyperlipidemia, unspecified: Secondary | ICD-10-CM | POA: Diagnosis not present

## 2023-07-07 DIAGNOSIS — N184 Chronic kidney disease, stage 4 (severe): Secondary | ICD-10-CM | POA: Diagnosis not present

## 2023-07-07 DIAGNOSIS — I480 Paroxysmal atrial fibrillation: Secondary | ICD-10-CM | POA: Diagnosis not present

## 2023-07-07 DIAGNOSIS — D509 Iron deficiency anemia, unspecified: Secondary | ICD-10-CM | POA: Diagnosis not present

## 2023-07-07 NOTE — Discharge Summary (Signed)
 Physician Discharge Summary  Crystal [name] ZSW:[id number] DOB: [birth year] DOA: 06/27/2023  PCP: Orlena Bitters, MD  Admit date: 06/27/2023 Discharge date: 07/02/2023  Time spent: 45 minutes  Recommendations for Outpatient Follow-up:  Home with hospice   Discharge Diagnoses:  Principal Problem:   Acute on chronic systolic CHF (congestive heart failure) (HCC) Pulmonary hypertension Acute hypoxic respiratory failure NSTEMI   CAD (coronary artery disease)   Essential hypertension   AF (paroxysmal atrial fibrillation) (HCC)   CKD (chronic kidney disease), stage IV (HCC)   Type 2 diabetes mellitus (HCC)   Anemia   Discharge Condition: guarded   Filed Weights   06/30/23 0406 07/01/23 0423 07/02/23 0411  Weight: 56.2 kg 55.7 kg 55.7 kg    History of present illness:  89/F w Pafib,  systolic CHF, CKD stage IV, subdural hematoma, hypertension, CAD and iron deficiency anemia.  -5/13 hospitalized with pneumonia and subdural hematoma due to mechanical fall, she was discharged to SNF.   -5/27 transferred from SNF to [facility] with diagnosis of bradycardia, weakness, renal failure and hyperkalemia.  Carvedilol  was discontinued with improvement in heart rate, she received IV fluids with improvement in renal function, with a serum cr from 6.1 down to 3.2 -5/30 patient had acute respiratory distress, rapid response called, found with volume overload, troponin went up from 120 to 2800. Cardiology was contacted in [facility] for transfer to tertiary care. Unfortunately no beds available, at [facility]. [facility] accepted [facility].  -5/31 She was transferred to from [facility] to [facility],  Labs -bun 38 Cr 2.74, BNP >4,500,  troponin 1,828 and 1,613 , hgb 8.6 plt 452  -CT head with suspected trace 1-2 mm subacute subdural hematoma overlying the left frontal convexity. No significant mass effect or midline shift. 89  related atrophy with moderate chronic small vessel ischemic disease with a few remote  lacunar infarcts about the left frontal centrum ovale and left cerebellum.  -CXR -right rotation, positive cardiomegaly with bilateral hilar vascular congestion, bilateral central interstitial infiltrates and bilateral moderate pleural effusions, more right than left.  -6/02 patient with worsening dyspnea and persistent volume overload. Poor prognosis.  -6/03 patient off bipap, continue with high oxygen  demand, code status changed to DNR. No further escalation of care.  Hospital Course:   Acute on chronic systolic CHF  Pulm  HTN -Echo with new reduction in LV systolic function down to <20%, global hypokinesis, RV systolic function preserved, moderate to severe tricuspid regurgitation.  -Poor response to diuretics so far, remains on Lasix  60 Mg twice daily -Limited pharmacologic options due to risk of hypotension and CKD -Chart and Dr. Geanie Keen notes reviewed - Poor prognosis, limited response to diuretics, discussed with patient and niece regarding consideration of hospice at [place], palliative team consulted, she will discharge home with hospice services today   Acute hypoxemic respiratory failure  Acute cardiogenic pulmonary edema with bilateral pleural effusions.  06/02 follow up chest radiograph with right rotation, bilateral hilar vascular congestion, cephalization of the vasculature, bilateral central interstitial infiltrates, and bilateral pleural effusions.  -Poor prognosis, as above   CAD (coronary artery disease) NSTEMI,.  -Troponin peaked at 1828 New drop in LV systolic function, no regional wall motion abnormalities.  Cards following, not a candidate for ischemic eval She has a poor physical functional status.     AF (paroxysmal atrial fibrillation) (HCC) Currently sinus rhythm, continue amiodarone .  No B blocker due to bradycardia  No anticoagulation due to recent intracranial bleed.    CKD (  chronic kidney disease), stage IV (HCC) Hyponatremia, hyperkalemia,  -serum cr at  3.0 with K at 4,9  Very poor prognosis, not candidate for renal replacement therapy due to advanced age, frailty and severe cardiomyopathy Continue diuresis as tolerated.,  Discussed need for consideration of home hospice   Type 2 diabetes mellitus (HCC) -SSI   Acute on chronic anemia Anemia panel suggests iron deficiency and chronic disease Iron panel with serum iron at 29, TIBC 252, transferrin saturation 12 and transferrin 182, ferritin 471  Discharge Exam: Vitals:   07/02/23 1135 07/02/23 1200  BP: (!) 127/59   Pulse: 76 81  Resp: 17   Temp:    SpO2: 98% 93%   General exam: Appears calm and comfortable  HEENT: no JVD Respiratory system: Decreased sounds at the bases Cardiovascular system: S1 & S2 heard, RRR.  Abd: nondistended, soft and nontender.Normal bowel sounds heard. Central nervous system: Alert and oriented. No focal neurological deficits. Extremities: Trace edema Skin: No rashes Psychiatry:  Mood & affect appropriate.     Discharge Instructions   Discharge Instructions     Diet - low sodium heart healthy   Complete by: As directed    Increase activity slowly   Complete by: As directed    No dressing needed   Complete by: As directed       Allergies as of 07/02/2023       Reactions   Bactrim [sulfamethoxazole-trimethoprim] Nausea And Vomiting   Sulfa Antibiotics Nausea And Vomiting   Clonidine Derivatives Other (See Comments)   FATIGUE   Evista [raloxifene Hcl] Other (See Comments)   GERD   Fosamax [alendronate Sodium] Other (See Comments)   INCREASED GERD    Glipizide Other (See Comments)   PALPITATIONS   Lipitor [atorvastatin Calcium ] Other (See Comments)   ACHING   Losartan  Other (See Comments)   FATIGUE    Pravastatin Other (See Comments)   ACHING   Azithromycin Rash, Other (See Comments)   FACIAL BURNING         Medication List     STOP taking these medications    amLODipine  5 MG tablet Commonly known as: NORVASC     carvedilol  6.25 MG tablet Commonly known as: COREG    hydrALAZINE  100 MG tablet Commonly known as: APRESOLINE    isosorbide  mononitrate 120 MG 24 hr tablet Commonly known as: IMDUR    spironolactone  25 MG tablet Commonly known as: ALDACTONE        TAKE these medications    acetaminophen  500 MG tablet Commonly known as: TYLENOL  Take 500 mg by mouth every 6 (six) hours as needed for headache (pain).   amiodarone  200 MG tablet Commonly known as: PACERONE  Take 0.5 tablets (100 mg total) by mouth daily.   CALCIUM  CARB-CHOLECALCIFEROL  PO Take 1 tablet by mouth daily.   cholecalciferol  25 MCG (1000 UNIT) tablet Commonly known as: VITAMIN D3 Take 1,000 Units by mouth daily with breakfast.   diazepam  2 MG tablet Commonly known as: VALIUM  Take 2 mg by mouth 2 (two) times daily as needed for anxiety.   docusate sodium  100 MG capsule Commonly known as: COLACE Take 100 mg by mouth daily as needed for mild constipation.   Eliquis  2.5 MG Tabs tablet Generic drug: apixaban  TAKE 1 TABLET BY MOUTH TWICE DAILY   ferrous sulfate  325 (65 FE) MG tablet Take 325 mg by mouth daily before lunch.   GAS-X PO Take 1 capsule by mouth daily as needed (gas).   loperamide  2 MG capsule Commonly known as: IMODIUM   Take 2 mg by mouth as needed for diarrhea or loose stools.   meclizine  25 MG tablet Commonly known as: ANTIVERT  Take 25 mg by mouth as needed for dizziness.   multivitamin with minerals Tabs tablet Take 1 tablet by mouth daily. Centrum Silver for Women   multivitamin-lutein  Caps capsule Take 1 capsule by mouth daily before lunch.   nitroGLYCERIN  0.4 MG SL tablet Commonly known as: NITROSTAT  PLACE 1 TAB UNDER TONGUE EVERY 3 MINUTES AS DIRECTED UP TO 3 TIMES AS NEEDED FOR CHEST PAIN.   oxyCODONE -acetaminophen  5-325 MG tablet Commonly known as: PERCOCET/ROXICET Take 1 tablet by mouth every 6 (six) hours as needed for severe pain (pain score 7-10).   polyethylene glycol  powder 17 GM/SCOOP powder Commonly known as: GLYCOLAX /MIRALAX  Take 17 g by mouth daily as needed for mild constipation.   Senna-S 8.6-50 MG tablet Generic drug: senna-docusate Take 1 tablet by mouth 2 (two) times daily.   sodium chloride  0.65 % Soln nasal spray Commonly known as: OCEAN Place 1 spray into both nostrils as needed for congestion.   torsemide  20 MG tablet Commonly known as: DEMADEX  Take 1 tablet (20 mg total) by mouth daily. What changed:  how much to take additional instructions               Discharge Care Instructions  (From admission, onward)           Start     Ordered   07/02/23 0000  No dressing needed        07/02/23 1012           Allergies  Allergen Reactions   Bactrim [Sulfamethoxazole-Trimethoprim] Nausea And Vomiting   Sulfa Antibiotics Nausea And Vomiting   Clonidine Derivatives Other (See Comments)    FATIGUE   Evista [Raloxifene Hcl] Other (See Comments)    GERD   Fosamax [Alendronate Sodium] Other (See Comments)    INCREASED GERD    Glipizide Other (See Comments)    PALPITATIONS   Lipitor [Atorvastatin Calcium ] Other (See Comments)    ACHING   Losartan  Other (See Comments)    FATIGUE    Pravastatin Other (See Comments)    ACHING   Azithromycin Rash and Other (See Comments)    FACIAL BURNING       The results of significant diagnostics from this hospitalization (including imaging, microbiology, ancillary and laboratory) are listed below for reference.    Significant Diagnostic Studies: DG Chest Port 1 View Result Date: 06/29/2023 CLINICAL DATA:  Shortness of breath EXAM: PORTABLE CHEST 1 VIEW COMPARISON:  06/27/2023 FINDINGS: Heart size and mediastinal contours are stable. Aortic atherosclerosis. Lungs are hypoinflated. Bilateral pleural effusions and interstitial edema are again noted. Compared with the previous exam the pleural effusions appear decreased in the interval. IMPRESSION: Persistent interstitial edema and  bilateral pleural effusions compatible with CHF. Pleural effusions appear decreased in the interval. Electronically Signed   By: Kimberley Penman M.D.   On: 06/29/2023 06:09   ECHOCARDIOGRAM COMPLETE Result Date: 06/28/2023    ECHOCARDIOGRAM REPORT   Patient Name:   FALANA CLAGG Date of Exam: 06/28/2023 Medical Rec #:  [id number]          Height:       65.0 in Accession #:    [id number]         Weight:       130.5 lb Date of Birth:  [birth year]          BSA:  1.650 m Patient Age:    88 years           BP:           115/47 mmHg Patient Gender: F                  HR:           90 bpm. Exam Location:  Inpatient Procedure: 2D Echo (Both Spectral and Color Flow Doppler were utilized during            procedure). STAT ECHO Indications:    acute systolic chf  History:        Patient has prior history of Echocardiogram examinations, most                 recent 09/29/2021. CHF, CAD; Risk Factors:Diabetes and                 Hypertension.  Sonographer:    Dione Franks RDCS Referring Phys: [id number] AVA SWAYZE IMPRESSIONS  1. Left ventricular ejection fraction, by estimation, is <20%. The left ventricle has severely decreased function. The left ventricle demonstrates global hypokinesis. Left ventricular diastolic parameters are consistent with Grade II diastolic dysfunction (pseudonormalization). Elevated left atrial pressure. The E/e' is 30.  2. Right ventricular systolic function is low normal. The right ventricular size is mildly enlarged. There is moderately elevated pulmonary artery systolic pressure. The estimated right ventricular systolic pressure is 62.6 mmHg.  3. Left atrial size was moderately dilated.  4. Right atrial size was mildly dilated.  5. The mitral valve is degenerative. Mild mitral valve regurgitation. Mild mitral stenosis. Moderate mitral annular calcification.  6. Tricuspid valve regurgitation is moderate to severe.  7. The aortic valve is calcified. Aortic valve regurgitation is trivial. aortic  valve sclerosis.  8. The inferior vena cava is dilated in size with <50% respiratory variability, suggesting right atrial pressure of 15 mmHg. Comparison(s): A prior study was performed on 02/17/2020. LVEF was reported 40-45% and now <20%, see prior report for details. FINDINGS  Left Ventricle: Left ventricular ejection fraction, by estimation, is <20%. The left ventricle has severely decreased function. The left ventricle demonstrates global hypokinesis. The left ventricular internal cavity size was normal in size. There is borderline left ventricular hypertrophy. Left ventricular diastolic parameters are consistent with Grade II diastolic dysfunction (pseudonormalization). Elevated left atrial pressure. The E/e' is 30. Right Ventricle: The right ventricular size is mildly enlarged. Right vetricular wall thickness was not well visualized. Right ventricular systolic function is low normal. There is moderately elevated pulmonary artery systolic pressure. The tricuspid regurgitant velocity is 3.45 m/s, and with an assumed right atrial pressure of 15 mmHg, the estimated right ventricular systolic pressure is 62.6 mmHg. Left Atrium: Left atrial size was moderately dilated. Right Atrium: Right atrial size was mildly dilated. Pericardium: There is no evidence of pericardial effusion. Mitral Valve: The mitral valve is degenerative in appearance. There is mild thickening of the mitral valve leaflet(s). There is moderate calcification of the mitral valve leaflet(s). Moderate mitral annular calcification. Mild mitral valve regurgitation.  Mild mitral valve stenosis. MV peak gradient, 11.7 mmHg. The mean mitral valve gradient is 5.0 mmHg. Tricuspid Valve: The tricuspid valve is normal in structure. Tricuspid valve regurgitation is moderate to severe. No evidence of tricuspid stenosis. Aortic Valve: The aortic valve is calcified. There is mild to moderate aortic valve annular calcification. Aortic valve regurgitation is  trivial. Aortic valve sclerosis. Pulmonic Valve: The pulmonic valve was not well visualized.  Pulmonic valve regurgitation is trivial. No evidence of pulmonic stenosis. Aorta: The aortic root and ascending aorta are structurally normal, with no evidence of dilitation. Venous: The inferior vena cava is dilated in size with less than 50% respiratory variability, suggesting right atrial pressure of 15 mmHg. IAS/Shunts: The atrial septum is grossly normal. Additional Comments: There is pleural effusion in the left lateral region.  LEFT VENTRICLE PLAX 2D LVIDd:         4.80 cm     Diastology LVIDs:         4.40 cm     LV e' medial:    4.90 cm/s LV PW:         0.90 cm     LV E/e' medial:  31.2 LV IVS:        1.10 cm     LV e' lateral:   5.33 cm/s LVOT diam:     1.80 cm     LV E/e' lateral: 28.7 LV SV:         32 LV SV Index:   19 LVOT Area:     2.54 cm  LV Volumes (MOD) LV vol d, MOD A4C: 69.7 ml LV vol s, MOD A4C: 61.9 ml LV SV MOD A4C:     69.7 ml RIGHT VENTRICLE            IVC RV Basal diam:  2.90 cm    IVC diam: 2.40 cm RV S prime:     9.68 cm/s TAPSE (M-mode): 1.9 cm LEFT ATRIUM           Index        RIGHT ATRIUM           Index LA diam:      3.70 cm 2.24 cm/m   RA Area:     19.90 cm LA Vol (A2C): 77.6 ml 47.03 ml/m  RA Volume:   60.80 ml  36.85 ml/m LA Vol (A4C): 69.1 ml 41.88 ml/m  AORTIC VALVE LVOT Vmax:   68.30 cm/s LVOT Vmean:  44.000 cm/s LVOT VTI:    0.126 m  AORTA Ao Root diam: 2.90 cm Ao Asc diam:  2.80 cm MITRAL VALVE                TRICUSPID VALVE MV Area (PHT): 4.68 cm     TR Peak grad:   47.6 mmHg MV Area VTI:   0.93 cm     TR Vmax:        345.00 cm/s MV Peak grad:  11.7 mmHg MV Mean grad:  5.0 mmHg     SHUNTS MV Vmax:       1.71 m/s     Systemic VTI:  0.13 m MV Vmean:      101.0 cm/s   Systemic Diam: 1.80 cm MV Decel Time: 162 msec MV E velocity: 153.00 cm/s MV A velocity: 123.00 cm/s MV E/A ratio:  1.24 Sunit Tolia Electronically signed by Olinda Bertrand Signature Date/Time: 06/28/2023/9:19:29 AM     Final    CT HEAD WO CONTRAST ( ) Result Date: 06/28/2023 CLINICAL DATA:  Initial evaluation for acute NSTEMI, history of recent head trauma with subdural hematoma. EXAM: CT HEAD WITHOUT CONTRAST TECHNIQUE: Contiguous axial images were obtained from the base of the skull through the vertex without intravenous contrast. RADIATION DOSE REDUCTION: This exam was performed according to the departmental dose-optimization program which includes automated exposure control, adjustment of the mA and/or kV according to patient size and/or use of iterative reconstruction  technique. COMPARISON:  None Available. FINDINGS: Brain: Generalized age-related cerebral atrophy. Patchy hypodensity involving the supratentorial cerebral white matter, consistent with chronic small vessel ischemic disease, moderately advanced in nature. Few small remote lacunar infarcts about the left frontal centrum semi ovale and left cerebellum. Suspected trace subdural collection overlying the left frontal convexity, measuring no more than 1-2 mm in maximal thickness (series 5, image 39). This is isodense to adjacent brain, and suspected to reflect a trace residual subacute subdural hematoma. No hyperdense acute blood products. No significant mass effect. No other acute intracranial hemorrhage. No acute large vessel territory infarct. No mass lesion or midline shift. Mild ventricular prominence related to global parenchymal volume loss of hydrocephalus. Vascular: No abnormal hyperdense vessel. Scattered calcified atherosclerosis present at the skull base. Skull: Scalp soft tissues demonstrate no acute finding. Calvarium intact. Sinuses/Orbits: Globes orbital soft tissues within normal limits. Mucosal thickening with small volume pneumatized secretions noted about the sphenoid sinuses. Paranasal sinuses are otherwise largely clear. Trace bilateral mastoid effusions noted, of doubtful significance. Other: None. IMPRESSION: 1. Suspected trace 1-2 mm  subacute subdural hematoma overlying the left frontal convexity as above. No significant mass effect or midline shift. 2. No other acute intracranial abnormality. 3. Generalized age-related cerebral atrophy with moderate chronic small vessel ischemic disease, with a few small remote lacunar infarcts about the left frontal centrum semi ovale and left cerebellum. Electronically Signed   By: Virgia Griffins M.D.   On: 06/28/2023 04:19   Portable chest x-ray 1 view Result Date: 06/27/2023 CLINICAL DATA:  [id number] with acute hypoxic respiratory failure. EXAM: PORTABLE CHEST 1 VIEW COMPARISON:  Portable chest 11/11/2022 FINDINGS: There are moderate to large sized layering pleural effusions. There are overlying opacities of the mid to lower lung fields which could be atelectasis, edema or pneumonia. Cardiomegaly. There is perihilar vascular congestion and radiating opacities consistent with moderate interstitial and alveolar edema. There is aortic atherosclerosis. Stable mediastinum. Right axillary surgical clips. Upper thoracic levoscoliosis and osteopenia noted with degenerative changes. IMPRESSION: 1. Moderate to large sized layering pleural effusions with overlying opacities which could be atelectasis, edema or pneumonia. 2. Cardiomegaly with moderate perihilar radiating interstitial and alveolar edema consistent with CHF or fluid overload. 3. Aortic atherosclerosis. Electronically Signed   By: Denman Fischer M.D.   On: 06/27/2023 20:26    Microbiology: No results found for this or any previous visit (from the past 240 hours).   Labs: Basic Metabolic Panel: Recent Labs  Lab 07/01/23 0235  NA 134*  K 4.7  CL 99  CO2 23  GLUCOSE 129*  BUN 57*  CREATININE 2.91*  CALCIUM  8.8*  MG 2.2   Liver Function Tests: No results for input(s): "AST", "ALT", "ALKPHOS", "BILITOT", "PROT", "ALBUMIN " in the last 168 hours. No results for input(s): "LIPASE", "AMYLASE" in the last 168 hours. No results for  input(s): "AMMONIA" in the last 168 hours. CBC: No results for input(s): "WBC", "NEUTROABS", "HGB", "HCT", "MCV", "PLT" in the last 168 hours. Cardiac Enzymes: No results for input(s): "CKTOTAL", "CKMB", "CKMBINDEX", "TROPONINI" in the last 168 hours. BNP: BNP (last 3 results) Recent Labs    06/27/23 1815 06/29/23 0317  BNP >4,500.0* 3,602.4*    ProBNP (last 3 results) No results for input(s): "PROBNP" in the last 8760 hours.  CBG: No results for input(s): "GLUCAP" in the last 168 hours.     Signed:  Deforest Fast MD.  [facility] 07/07/2023, 1:21 PM

## 2023-07-08 DIAGNOSIS — I10 Essential (primary) hypertension: Secondary | ICD-10-CM | POA: Diagnosis not present

## 2023-07-08 DIAGNOSIS — D509 Iron deficiency anemia, unspecified: Secondary | ICD-10-CM | POA: Diagnosis not present

## 2023-07-08 DIAGNOSIS — E785 Hyperlipidemia, unspecified: Secondary | ICD-10-CM | POA: Diagnosis not present

## 2023-07-08 DIAGNOSIS — N184 Chronic kidney disease, stage 4 (severe): Secondary | ICD-10-CM | POA: Diagnosis not present

## 2023-07-08 DIAGNOSIS — I5022 Chronic systolic (congestive) heart failure: Secondary | ICD-10-CM | POA: Diagnosis not present

## 2023-07-08 DIAGNOSIS — I480 Paroxysmal atrial fibrillation: Secondary | ICD-10-CM | POA: Diagnosis not present

## 2023-07-10 DIAGNOSIS — I5022 Chronic systolic (congestive) heart failure: Secondary | ICD-10-CM | POA: Diagnosis not present

## 2023-07-10 DIAGNOSIS — E785 Hyperlipidemia, unspecified: Secondary | ICD-10-CM | POA: Diagnosis not present

## 2023-07-10 DIAGNOSIS — I10 Essential (primary) hypertension: Secondary | ICD-10-CM | POA: Diagnosis not present

## 2023-07-10 DIAGNOSIS — I480 Paroxysmal atrial fibrillation: Secondary | ICD-10-CM | POA: Diagnosis not present

## 2023-07-10 DIAGNOSIS — D509 Iron deficiency anemia, unspecified: Secondary | ICD-10-CM | POA: Diagnosis not present

## 2023-07-10 DIAGNOSIS — N184 Chronic kidney disease, stage 4 (severe): Secondary | ICD-10-CM | POA: Diagnosis not present

## 2023-07-13 DIAGNOSIS — D509 Iron deficiency anemia, unspecified: Secondary | ICD-10-CM | POA: Diagnosis not present

## 2023-07-13 DIAGNOSIS — I10 Essential (primary) hypertension: Secondary | ICD-10-CM | POA: Diagnosis not present

## 2023-07-13 DIAGNOSIS — E785 Hyperlipidemia, unspecified: Secondary | ICD-10-CM | POA: Diagnosis not present

## 2023-07-13 DIAGNOSIS — I5022 Chronic systolic (congestive) heart failure: Secondary | ICD-10-CM | POA: Diagnosis not present

## 2023-07-13 DIAGNOSIS — N184 Chronic kidney disease, stage 4 (severe): Secondary | ICD-10-CM | POA: Diagnosis not present

## 2023-07-13 DIAGNOSIS — I480 Paroxysmal atrial fibrillation: Secondary | ICD-10-CM | POA: Diagnosis not present

## 2023-07-15 DIAGNOSIS — I10 Essential (primary) hypertension: Secondary | ICD-10-CM | POA: Diagnosis not present

## 2023-07-15 DIAGNOSIS — N184 Chronic kidney disease, stage 4 (severe): Secondary | ICD-10-CM | POA: Diagnosis not present

## 2023-07-15 DIAGNOSIS — E785 Hyperlipidemia, unspecified: Secondary | ICD-10-CM | POA: Diagnosis not present

## 2023-07-15 DIAGNOSIS — D509 Iron deficiency anemia, unspecified: Secondary | ICD-10-CM | POA: Diagnosis not present

## 2023-07-15 DIAGNOSIS — I5022 Chronic systolic (congestive) heart failure: Secondary | ICD-10-CM | POA: Diagnosis not present

## 2023-07-15 DIAGNOSIS — I480 Paroxysmal atrial fibrillation: Secondary | ICD-10-CM | POA: Diagnosis not present

## 2023-07-17 DIAGNOSIS — N184 Chronic kidney disease, stage 4 (severe): Secondary | ICD-10-CM | POA: Diagnosis not present

## 2023-07-17 DIAGNOSIS — I480 Paroxysmal atrial fibrillation: Secondary | ICD-10-CM | POA: Diagnosis not present

## 2023-07-17 DIAGNOSIS — N179 Acute kidney failure, unspecified: Secondary | ICD-10-CM | POA: Diagnosis not present

## 2023-07-17 DIAGNOSIS — E785 Hyperlipidemia, unspecified: Secondary | ICD-10-CM | POA: Diagnosis not present

## 2023-07-17 DIAGNOSIS — D509 Iron deficiency anemia, unspecified: Secondary | ICD-10-CM | POA: Diagnosis not present

## 2023-07-17 DIAGNOSIS — I10 Essential (primary) hypertension: Secondary | ICD-10-CM | POA: Diagnosis not present

## 2023-07-17 DIAGNOSIS — I5022 Chronic systolic (congestive) heart failure: Secondary | ICD-10-CM | POA: Diagnosis not present

## 2023-07-20 DIAGNOSIS — I5022 Chronic systolic (congestive) heart failure: Secondary | ICD-10-CM | POA: Diagnosis not present

## 2023-07-20 DIAGNOSIS — I10 Essential (primary) hypertension: Secondary | ICD-10-CM | POA: Diagnosis not present

## 2023-07-20 DIAGNOSIS — E785 Hyperlipidemia, unspecified: Secondary | ICD-10-CM | POA: Diagnosis not present

## 2023-07-20 DIAGNOSIS — N184 Chronic kidney disease, stage 4 (severe): Secondary | ICD-10-CM | POA: Diagnosis not present

## 2023-07-20 DIAGNOSIS — D509 Iron deficiency anemia, unspecified: Secondary | ICD-10-CM | POA: Diagnosis not present

## 2023-07-20 DIAGNOSIS — I480 Paroxysmal atrial fibrillation: Secondary | ICD-10-CM | POA: Diagnosis not present

## 2023-07-22 DIAGNOSIS — I5022 Chronic systolic (congestive) heart failure: Secondary | ICD-10-CM | POA: Diagnosis not present

## 2023-07-22 DIAGNOSIS — E785 Hyperlipidemia, unspecified: Secondary | ICD-10-CM | POA: Diagnosis not present

## 2023-07-22 DIAGNOSIS — I10 Essential (primary) hypertension: Secondary | ICD-10-CM | POA: Diagnosis not present

## 2023-07-22 DIAGNOSIS — I480 Paroxysmal atrial fibrillation: Secondary | ICD-10-CM | POA: Diagnosis not present

## 2023-07-22 DIAGNOSIS — N184 Chronic kidney disease, stage 4 (severe): Secondary | ICD-10-CM | POA: Diagnosis not present

## 2023-07-22 DIAGNOSIS — D509 Iron deficiency anemia, unspecified: Secondary | ICD-10-CM | POA: Diagnosis not present

## 2023-07-23 DIAGNOSIS — E785 Hyperlipidemia, unspecified: Secondary | ICD-10-CM | POA: Diagnosis not present

## 2023-07-23 DIAGNOSIS — N184 Chronic kidney disease, stage 4 (severe): Secondary | ICD-10-CM | POA: Diagnosis not present

## 2023-07-23 DIAGNOSIS — I480 Paroxysmal atrial fibrillation: Secondary | ICD-10-CM | POA: Diagnosis not present

## 2023-07-23 DIAGNOSIS — D509 Iron deficiency anemia, unspecified: Secondary | ICD-10-CM | POA: Diagnosis not present

## 2023-07-23 DIAGNOSIS — I5022 Chronic systolic (congestive) heart failure: Secondary | ICD-10-CM | POA: Diagnosis not present

## 2023-07-23 DIAGNOSIS — I10 Essential (primary) hypertension: Secondary | ICD-10-CM | POA: Diagnosis not present

## 2023-07-24 DIAGNOSIS — E785 Hyperlipidemia, unspecified: Secondary | ICD-10-CM | POA: Diagnosis not present

## 2023-07-24 DIAGNOSIS — I10 Essential (primary) hypertension: Secondary | ICD-10-CM | POA: Diagnosis not present

## 2023-07-24 DIAGNOSIS — I5022 Chronic systolic (congestive) heart failure: Secondary | ICD-10-CM | POA: Diagnosis not present

## 2023-07-24 DIAGNOSIS — I480 Paroxysmal atrial fibrillation: Secondary | ICD-10-CM | POA: Diagnosis not present

## 2023-07-24 DIAGNOSIS — D509 Iron deficiency anemia, unspecified: Secondary | ICD-10-CM | POA: Diagnosis not present

## 2023-07-24 DIAGNOSIS — N184 Chronic kidney disease, stage 4 (severe): Secondary | ICD-10-CM | POA: Diagnosis not present

## 2023-07-27 DIAGNOSIS — I10 Essential (primary) hypertension: Secondary | ICD-10-CM | POA: Diagnosis not present

## 2023-07-27 DIAGNOSIS — I5022 Chronic systolic (congestive) heart failure: Secondary | ICD-10-CM | POA: Diagnosis not present

## 2023-07-27 DIAGNOSIS — E785 Hyperlipidemia, unspecified: Secondary | ICD-10-CM | POA: Diagnosis not present

## 2023-07-27 DIAGNOSIS — N184 Chronic kidney disease, stage 4 (severe): Secondary | ICD-10-CM | POA: Diagnosis not present

## 2023-07-27 DIAGNOSIS — I480 Paroxysmal atrial fibrillation: Secondary | ICD-10-CM | POA: Diagnosis not present

## 2023-07-27 DIAGNOSIS — D509 Iron deficiency anemia, unspecified: Secondary | ICD-10-CM | POA: Diagnosis not present

## 2023-07-28 DIAGNOSIS — D509 Iron deficiency anemia, unspecified: Secondary | ICD-10-CM | POA: Diagnosis not present

## 2023-07-28 DIAGNOSIS — Z853 Personal history of malignant neoplasm of breast: Secondary | ICD-10-CM | POA: Diagnosis not present

## 2023-07-28 DIAGNOSIS — I214 Non-ST elevation (NSTEMI) myocardial infarction: Secondary | ICD-10-CM | POA: Diagnosis not present

## 2023-07-28 DIAGNOSIS — N184 Chronic kidney disease, stage 4 (severe): Secondary | ICD-10-CM | POA: Diagnosis not present

## 2023-07-28 DIAGNOSIS — I10 Essential (primary) hypertension: Secondary | ICD-10-CM | POA: Diagnosis not present

## 2023-07-28 DIAGNOSIS — E785 Hyperlipidemia, unspecified: Secondary | ICD-10-CM | POA: Diagnosis not present

## 2023-07-28 DIAGNOSIS — E209 Hypoparathyroidism, unspecified: Secondary | ICD-10-CM | POA: Diagnosis not present

## 2023-07-28 DIAGNOSIS — M109 Gout, unspecified: Secondary | ICD-10-CM | POA: Diagnosis not present

## 2023-07-28 DIAGNOSIS — I480 Paroxysmal atrial fibrillation: Secondary | ICD-10-CM | POA: Diagnosis not present

## 2023-07-28 DIAGNOSIS — K219 Gastro-esophageal reflux disease without esophagitis: Secondary | ICD-10-CM | POA: Diagnosis not present

## 2023-07-28 DIAGNOSIS — I5022 Chronic systolic (congestive) heart failure: Secondary | ICD-10-CM | POA: Diagnosis not present

## 2023-07-28 DIAGNOSIS — I519 Heart disease, unspecified: Secondary | ICD-10-CM | POA: Diagnosis not present

## 2023-07-28 DIAGNOSIS — E118 Type 2 diabetes mellitus with unspecified complications: Secondary | ICD-10-CM | POA: Diagnosis not present

## 2023-07-29 DIAGNOSIS — D509 Iron deficiency anemia, unspecified: Secondary | ICD-10-CM | POA: Diagnosis not present

## 2023-07-29 DIAGNOSIS — N184 Chronic kidney disease, stage 4 (severe): Secondary | ICD-10-CM | POA: Diagnosis not present

## 2023-07-29 DIAGNOSIS — I5022 Chronic systolic (congestive) heart failure: Secondary | ICD-10-CM | POA: Diagnosis not present

## 2023-07-29 DIAGNOSIS — I480 Paroxysmal atrial fibrillation: Secondary | ICD-10-CM | POA: Diagnosis not present

## 2023-07-29 DIAGNOSIS — E785 Hyperlipidemia, unspecified: Secondary | ICD-10-CM | POA: Diagnosis not present

## 2023-07-29 DIAGNOSIS — I10 Essential (primary) hypertension: Secondary | ICD-10-CM | POA: Diagnosis not present

## 2023-07-30 DIAGNOSIS — I5022 Chronic systolic (congestive) heart failure: Secondary | ICD-10-CM | POA: Diagnosis not present

## 2023-07-30 DIAGNOSIS — E785 Hyperlipidemia, unspecified: Secondary | ICD-10-CM | POA: Diagnosis not present

## 2023-07-30 DIAGNOSIS — I10 Essential (primary) hypertension: Secondary | ICD-10-CM | POA: Diagnosis not present

## 2023-07-30 DIAGNOSIS — I480 Paroxysmal atrial fibrillation: Secondary | ICD-10-CM | POA: Diagnosis not present

## 2023-07-30 DIAGNOSIS — N184 Chronic kidney disease, stage 4 (severe): Secondary | ICD-10-CM | POA: Diagnosis not present

## 2023-07-30 DIAGNOSIS — D509 Iron deficiency anemia, unspecified: Secondary | ICD-10-CM | POA: Diagnosis not present

## 2023-08-03 DIAGNOSIS — I5022 Chronic systolic (congestive) heart failure: Secondary | ICD-10-CM | POA: Diagnosis not present

## 2023-08-03 DIAGNOSIS — I480 Paroxysmal atrial fibrillation: Secondary | ICD-10-CM | POA: Diagnosis not present

## 2023-08-03 DIAGNOSIS — D509 Iron deficiency anemia, unspecified: Secondary | ICD-10-CM | POA: Diagnosis not present

## 2023-08-03 DIAGNOSIS — N184 Chronic kidney disease, stage 4 (severe): Secondary | ICD-10-CM | POA: Diagnosis not present

## 2023-08-03 DIAGNOSIS — E785 Hyperlipidemia, unspecified: Secondary | ICD-10-CM | POA: Diagnosis not present

## 2023-08-03 DIAGNOSIS — I10 Essential (primary) hypertension: Secondary | ICD-10-CM | POA: Diagnosis not present

## 2023-08-05 DIAGNOSIS — D509 Iron deficiency anemia, unspecified: Secondary | ICD-10-CM | POA: Diagnosis not present

## 2023-08-05 DIAGNOSIS — I10 Essential (primary) hypertension: Secondary | ICD-10-CM | POA: Diagnosis not present

## 2023-08-05 DIAGNOSIS — N184 Chronic kidney disease, stage 4 (severe): Secondary | ICD-10-CM | POA: Diagnosis not present

## 2023-08-05 DIAGNOSIS — I5022 Chronic systolic (congestive) heart failure: Secondary | ICD-10-CM | POA: Diagnosis not present

## 2023-08-05 DIAGNOSIS — I480 Paroxysmal atrial fibrillation: Secondary | ICD-10-CM | POA: Diagnosis not present

## 2023-08-05 DIAGNOSIS — E785 Hyperlipidemia, unspecified: Secondary | ICD-10-CM | POA: Diagnosis not present

## 2023-08-07 DIAGNOSIS — N184 Chronic kidney disease, stage 4 (severe): Secondary | ICD-10-CM | POA: Diagnosis not present

## 2023-08-07 DIAGNOSIS — I5022 Chronic systolic (congestive) heart failure: Secondary | ICD-10-CM | POA: Diagnosis not present

## 2023-08-07 DIAGNOSIS — D509 Iron deficiency anemia, unspecified: Secondary | ICD-10-CM | POA: Diagnosis not present

## 2023-08-07 DIAGNOSIS — I480 Paroxysmal atrial fibrillation: Secondary | ICD-10-CM | POA: Diagnosis not present

## 2023-08-07 DIAGNOSIS — I10 Essential (primary) hypertension: Secondary | ICD-10-CM | POA: Diagnosis not present

## 2023-08-07 DIAGNOSIS — E785 Hyperlipidemia, unspecified: Secondary | ICD-10-CM | POA: Diagnosis not present

## 2023-08-10 DIAGNOSIS — D509 Iron deficiency anemia, unspecified: Secondary | ICD-10-CM | POA: Diagnosis not present

## 2023-08-10 DIAGNOSIS — E785 Hyperlipidemia, unspecified: Secondary | ICD-10-CM | POA: Diagnosis not present

## 2023-08-10 DIAGNOSIS — N184 Chronic kidney disease, stage 4 (severe): Secondary | ICD-10-CM | POA: Diagnosis not present

## 2023-08-10 DIAGNOSIS — I5022 Chronic systolic (congestive) heart failure: Secondary | ICD-10-CM | POA: Diagnosis not present

## 2023-08-10 DIAGNOSIS — I480 Paroxysmal atrial fibrillation: Secondary | ICD-10-CM | POA: Diagnosis not present

## 2023-08-10 DIAGNOSIS — I10 Essential (primary) hypertension: Secondary | ICD-10-CM | POA: Diagnosis not present

## 2023-08-11 DIAGNOSIS — N184 Chronic kidney disease, stage 4 (severe): Secondary | ICD-10-CM | POA: Diagnosis not present

## 2023-08-11 DIAGNOSIS — D509 Iron deficiency anemia, unspecified: Secondary | ICD-10-CM | POA: Diagnosis not present

## 2023-08-11 DIAGNOSIS — E785 Hyperlipidemia, unspecified: Secondary | ICD-10-CM | POA: Diagnosis not present

## 2023-08-11 DIAGNOSIS — I480 Paroxysmal atrial fibrillation: Secondary | ICD-10-CM | POA: Diagnosis not present

## 2023-08-11 DIAGNOSIS — I5022 Chronic systolic (congestive) heart failure: Secondary | ICD-10-CM | POA: Diagnosis not present

## 2023-08-11 DIAGNOSIS — I10 Essential (primary) hypertension: Secondary | ICD-10-CM | POA: Diagnosis not present

## 2023-08-12 DIAGNOSIS — D509 Iron deficiency anemia, unspecified: Secondary | ICD-10-CM | POA: Diagnosis not present

## 2023-08-12 DIAGNOSIS — N184 Chronic kidney disease, stage 4 (severe): Secondary | ICD-10-CM | POA: Diagnosis not present

## 2023-08-12 DIAGNOSIS — I5022 Chronic systolic (congestive) heart failure: Secondary | ICD-10-CM | POA: Diagnosis not present

## 2023-08-12 DIAGNOSIS — E785 Hyperlipidemia, unspecified: Secondary | ICD-10-CM | POA: Diagnosis not present

## 2023-08-12 DIAGNOSIS — I10 Essential (primary) hypertension: Secondary | ICD-10-CM | POA: Diagnosis not present

## 2023-08-12 DIAGNOSIS — I480 Paroxysmal atrial fibrillation: Secondary | ICD-10-CM | POA: Diagnosis not present

## 2023-08-13 ENCOUNTER — Ambulatory Visit: Admitting: Cardiology

## 2023-08-14 DIAGNOSIS — I480 Paroxysmal atrial fibrillation: Secondary | ICD-10-CM | POA: Diagnosis not present

## 2023-08-14 DIAGNOSIS — D509 Iron deficiency anemia, unspecified: Secondary | ICD-10-CM | POA: Diagnosis not present

## 2023-08-14 DIAGNOSIS — I5022 Chronic systolic (congestive) heart failure: Secondary | ICD-10-CM | POA: Diagnosis not present

## 2023-08-14 DIAGNOSIS — E785 Hyperlipidemia, unspecified: Secondary | ICD-10-CM | POA: Diagnosis not present

## 2023-08-14 DIAGNOSIS — N184 Chronic kidney disease, stage 4 (severe): Secondary | ICD-10-CM | POA: Diagnosis not present

## 2023-08-14 DIAGNOSIS — I10 Essential (primary) hypertension: Secondary | ICD-10-CM | POA: Diagnosis not present

## 2023-08-17 DIAGNOSIS — I480 Paroxysmal atrial fibrillation: Secondary | ICD-10-CM | POA: Diagnosis not present

## 2023-08-17 DIAGNOSIS — I10 Essential (primary) hypertension: Secondary | ICD-10-CM | POA: Diagnosis not present

## 2023-08-17 DIAGNOSIS — I5022 Chronic systolic (congestive) heart failure: Secondary | ICD-10-CM | POA: Diagnosis not present

## 2023-08-17 DIAGNOSIS — N184 Chronic kidney disease, stage 4 (severe): Secondary | ICD-10-CM | POA: Diagnosis not present

## 2023-08-17 DIAGNOSIS — E785 Hyperlipidemia, unspecified: Secondary | ICD-10-CM | POA: Diagnosis not present

## 2023-08-17 DIAGNOSIS — D509 Iron deficiency anemia, unspecified: Secondary | ICD-10-CM | POA: Diagnosis not present

## 2023-08-19 DIAGNOSIS — I10 Essential (primary) hypertension: Secondary | ICD-10-CM | POA: Diagnosis not present

## 2023-08-19 DIAGNOSIS — I5022 Chronic systolic (congestive) heart failure: Secondary | ICD-10-CM | POA: Diagnosis not present

## 2023-08-19 DIAGNOSIS — N184 Chronic kidney disease, stage 4 (severe): Secondary | ICD-10-CM | POA: Diagnosis not present

## 2023-08-19 DIAGNOSIS — I480 Paroxysmal atrial fibrillation: Secondary | ICD-10-CM | POA: Diagnosis not present

## 2023-08-19 DIAGNOSIS — D509 Iron deficiency anemia, unspecified: Secondary | ICD-10-CM | POA: Diagnosis not present

## 2023-08-19 DIAGNOSIS — E785 Hyperlipidemia, unspecified: Secondary | ICD-10-CM | POA: Diagnosis not present

## 2023-08-21 DIAGNOSIS — N184 Chronic kidney disease, stage 4 (severe): Secondary | ICD-10-CM | POA: Diagnosis not present

## 2023-08-21 DIAGNOSIS — I480 Paroxysmal atrial fibrillation: Secondary | ICD-10-CM | POA: Diagnosis not present

## 2023-08-21 DIAGNOSIS — D509 Iron deficiency anemia, unspecified: Secondary | ICD-10-CM | POA: Diagnosis not present

## 2023-08-21 DIAGNOSIS — E785 Hyperlipidemia, unspecified: Secondary | ICD-10-CM | POA: Diagnosis not present

## 2023-08-21 DIAGNOSIS — I10 Essential (primary) hypertension: Secondary | ICD-10-CM | POA: Diagnosis not present

## 2023-08-21 DIAGNOSIS — I5022 Chronic systolic (congestive) heart failure: Secondary | ICD-10-CM | POA: Diagnosis not present

## 2023-08-24 DIAGNOSIS — I480 Paroxysmal atrial fibrillation: Secondary | ICD-10-CM | POA: Diagnosis not present

## 2023-08-24 DIAGNOSIS — I10 Essential (primary) hypertension: Secondary | ICD-10-CM | POA: Diagnosis not present

## 2023-08-24 DIAGNOSIS — I5022 Chronic systolic (congestive) heart failure: Secondary | ICD-10-CM | POA: Diagnosis not present

## 2023-08-24 DIAGNOSIS — E785 Hyperlipidemia, unspecified: Secondary | ICD-10-CM | POA: Diagnosis not present

## 2023-08-24 DIAGNOSIS — D509 Iron deficiency anemia, unspecified: Secondary | ICD-10-CM | POA: Diagnosis not present

## 2023-08-24 DIAGNOSIS — N184 Chronic kidney disease, stage 4 (severe): Secondary | ICD-10-CM | POA: Diagnosis not present

## 2023-08-26 DIAGNOSIS — I5022 Chronic systolic (congestive) heart failure: Secondary | ICD-10-CM | POA: Diagnosis not present

## 2023-08-26 DIAGNOSIS — E785 Hyperlipidemia, unspecified: Secondary | ICD-10-CM | POA: Diagnosis not present

## 2023-08-26 DIAGNOSIS — I10 Essential (primary) hypertension: Secondary | ICD-10-CM | POA: Diagnosis not present

## 2023-08-26 DIAGNOSIS — N184 Chronic kidney disease, stage 4 (severe): Secondary | ICD-10-CM | POA: Diagnosis not present

## 2023-08-26 DIAGNOSIS — I480 Paroxysmal atrial fibrillation: Secondary | ICD-10-CM | POA: Diagnosis not present

## 2023-08-26 DIAGNOSIS — D509 Iron deficiency anemia, unspecified: Secondary | ICD-10-CM | POA: Diagnosis not present

## 2023-08-28 DIAGNOSIS — Z853 Personal history of malignant neoplasm of breast: Secondary | ICD-10-CM | POA: Diagnosis not present

## 2023-08-28 DIAGNOSIS — E785 Hyperlipidemia, unspecified: Secondary | ICD-10-CM | POA: Diagnosis not present

## 2023-08-28 DIAGNOSIS — I519 Heart disease, unspecified: Secondary | ICD-10-CM | POA: Diagnosis not present

## 2023-08-28 DIAGNOSIS — M109 Gout, unspecified: Secondary | ICD-10-CM | POA: Diagnosis not present

## 2023-08-28 DIAGNOSIS — D509 Iron deficiency anemia, unspecified: Secondary | ICD-10-CM | POA: Diagnosis not present

## 2023-08-28 DIAGNOSIS — N184 Chronic kidney disease, stage 4 (severe): Secondary | ICD-10-CM | POA: Diagnosis not present

## 2023-08-28 DIAGNOSIS — E118 Type 2 diabetes mellitus with unspecified complications: Secondary | ICD-10-CM | POA: Diagnosis not present

## 2023-08-28 DIAGNOSIS — I480 Paroxysmal atrial fibrillation: Secondary | ICD-10-CM | POA: Diagnosis not present

## 2023-08-28 DIAGNOSIS — I5022 Chronic systolic (congestive) heart failure: Secondary | ICD-10-CM | POA: Diagnosis not present

## 2023-08-28 DIAGNOSIS — I214 Non-ST elevation (NSTEMI) myocardial infarction: Secondary | ICD-10-CM | POA: Diagnosis not present

## 2023-08-28 DIAGNOSIS — E209 Hypoparathyroidism, unspecified: Secondary | ICD-10-CM | POA: Diagnosis not present

## 2023-08-28 DIAGNOSIS — K219 Gastro-esophageal reflux disease without esophagitis: Secondary | ICD-10-CM | POA: Diagnosis not present

## 2023-08-28 DIAGNOSIS — I10 Essential (primary) hypertension: Secondary | ICD-10-CM | POA: Diagnosis not present

## 2023-08-31 DIAGNOSIS — I5022 Chronic systolic (congestive) heart failure: Secondary | ICD-10-CM | POA: Diagnosis not present

## 2023-08-31 DIAGNOSIS — I10 Essential (primary) hypertension: Secondary | ICD-10-CM | POA: Diagnosis not present

## 2023-08-31 DIAGNOSIS — N184 Chronic kidney disease, stage 4 (severe): Secondary | ICD-10-CM | POA: Diagnosis not present

## 2023-08-31 DIAGNOSIS — I480 Paroxysmal atrial fibrillation: Secondary | ICD-10-CM | POA: Diagnosis not present

## 2023-08-31 DIAGNOSIS — D509 Iron deficiency anemia, unspecified: Secondary | ICD-10-CM | POA: Diagnosis not present

## 2023-08-31 DIAGNOSIS — E785 Hyperlipidemia, unspecified: Secondary | ICD-10-CM | POA: Diagnosis not present

## 2023-09-02 DIAGNOSIS — N184 Chronic kidney disease, stage 4 (severe): Secondary | ICD-10-CM | POA: Diagnosis not present

## 2023-09-02 DIAGNOSIS — I10 Essential (primary) hypertension: Secondary | ICD-10-CM | POA: Diagnosis not present

## 2023-09-02 DIAGNOSIS — I480 Paroxysmal atrial fibrillation: Secondary | ICD-10-CM | POA: Diagnosis not present

## 2023-09-02 DIAGNOSIS — E785 Hyperlipidemia, unspecified: Secondary | ICD-10-CM | POA: Diagnosis not present

## 2023-09-02 DIAGNOSIS — I5022 Chronic systolic (congestive) heart failure: Secondary | ICD-10-CM | POA: Diagnosis not present

## 2023-09-02 DIAGNOSIS — D509 Iron deficiency anemia, unspecified: Secondary | ICD-10-CM | POA: Diagnosis not present

## 2023-09-04 DIAGNOSIS — E785 Hyperlipidemia, unspecified: Secondary | ICD-10-CM | POA: Diagnosis not present

## 2023-09-04 DIAGNOSIS — D509 Iron deficiency anemia, unspecified: Secondary | ICD-10-CM | POA: Diagnosis not present

## 2023-09-04 DIAGNOSIS — I480 Paroxysmal atrial fibrillation: Secondary | ICD-10-CM | POA: Diagnosis not present

## 2023-09-04 DIAGNOSIS — I5022 Chronic systolic (congestive) heart failure: Secondary | ICD-10-CM | POA: Diagnosis not present

## 2023-09-04 DIAGNOSIS — N184 Chronic kidney disease, stage 4 (severe): Secondary | ICD-10-CM | POA: Diagnosis not present

## 2023-09-04 DIAGNOSIS — I10 Essential (primary) hypertension: Secondary | ICD-10-CM | POA: Diagnosis not present

## 2023-09-07 DIAGNOSIS — I10 Essential (primary) hypertension: Secondary | ICD-10-CM | POA: Diagnosis not present

## 2023-09-07 DIAGNOSIS — I480 Paroxysmal atrial fibrillation: Secondary | ICD-10-CM | POA: Diagnosis not present

## 2023-09-07 DIAGNOSIS — E785 Hyperlipidemia, unspecified: Secondary | ICD-10-CM | POA: Diagnosis not present

## 2023-09-07 DIAGNOSIS — N184 Chronic kidney disease, stage 4 (severe): Secondary | ICD-10-CM | POA: Diagnosis not present

## 2023-09-07 DIAGNOSIS — I5022 Chronic systolic (congestive) heart failure: Secondary | ICD-10-CM | POA: Diagnosis not present

## 2023-09-07 DIAGNOSIS — D509 Iron deficiency anemia, unspecified: Secondary | ICD-10-CM | POA: Diagnosis not present

## 2023-09-09 DIAGNOSIS — I10 Essential (primary) hypertension: Secondary | ICD-10-CM | POA: Diagnosis not present

## 2023-09-09 DIAGNOSIS — I480 Paroxysmal atrial fibrillation: Secondary | ICD-10-CM | POA: Diagnosis not present

## 2023-09-09 DIAGNOSIS — I5022 Chronic systolic (congestive) heart failure: Secondary | ICD-10-CM | POA: Diagnosis not present

## 2023-09-09 DIAGNOSIS — D509 Iron deficiency anemia, unspecified: Secondary | ICD-10-CM | POA: Diagnosis not present

## 2023-09-09 DIAGNOSIS — N184 Chronic kidney disease, stage 4 (severe): Secondary | ICD-10-CM | POA: Diagnosis not present

## 2023-09-09 DIAGNOSIS — E785 Hyperlipidemia, unspecified: Secondary | ICD-10-CM | POA: Diagnosis not present

## 2023-09-10 DIAGNOSIS — I5022 Chronic systolic (congestive) heart failure: Secondary | ICD-10-CM | POA: Diagnosis not present

## 2023-09-10 DIAGNOSIS — I480 Paroxysmal atrial fibrillation: Secondary | ICD-10-CM | POA: Diagnosis not present

## 2023-09-10 DIAGNOSIS — I10 Essential (primary) hypertension: Secondary | ICD-10-CM | POA: Diagnosis not present

## 2023-09-10 DIAGNOSIS — N184 Chronic kidney disease, stage 4 (severe): Secondary | ICD-10-CM | POA: Diagnosis not present

## 2023-09-10 DIAGNOSIS — D509 Iron deficiency anemia, unspecified: Secondary | ICD-10-CM | POA: Diagnosis not present

## 2023-09-10 DIAGNOSIS — E785 Hyperlipidemia, unspecified: Secondary | ICD-10-CM | POA: Diagnosis not present

## 2023-09-11 DIAGNOSIS — I5022 Chronic systolic (congestive) heart failure: Secondary | ICD-10-CM | POA: Diagnosis not present

## 2023-09-11 DIAGNOSIS — I10 Essential (primary) hypertension: Secondary | ICD-10-CM | POA: Diagnosis not present

## 2023-09-11 DIAGNOSIS — N184 Chronic kidney disease, stage 4 (severe): Secondary | ICD-10-CM | POA: Diagnosis not present

## 2023-09-11 DIAGNOSIS — D509 Iron deficiency anemia, unspecified: Secondary | ICD-10-CM | POA: Diagnosis not present

## 2023-09-11 DIAGNOSIS — E785 Hyperlipidemia, unspecified: Secondary | ICD-10-CM | POA: Diagnosis not present

## 2023-09-11 DIAGNOSIS — I480 Paroxysmal atrial fibrillation: Secondary | ICD-10-CM | POA: Diagnosis not present

## 2023-09-14 DIAGNOSIS — I480 Paroxysmal atrial fibrillation: Secondary | ICD-10-CM | POA: Diagnosis not present

## 2023-09-14 DIAGNOSIS — N184 Chronic kidney disease, stage 4 (severe): Secondary | ICD-10-CM | POA: Diagnosis not present

## 2023-09-14 DIAGNOSIS — I10 Essential (primary) hypertension: Secondary | ICD-10-CM | POA: Diagnosis not present

## 2023-09-14 DIAGNOSIS — I5022 Chronic systolic (congestive) heart failure: Secondary | ICD-10-CM | POA: Diagnosis not present

## 2023-09-14 DIAGNOSIS — D509 Iron deficiency anemia, unspecified: Secondary | ICD-10-CM | POA: Diagnosis not present

## 2023-09-14 DIAGNOSIS — E785 Hyperlipidemia, unspecified: Secondary | ICD-10-CM | POA: Diagnosis not present

## 2023-09-16 DIAGNOSIS — I480 Paroxysmal atrial fibrillation: Secondary | ICD-10-CM | POA: Diagnosis not present

## 2023-09-16 DIAGNOSIS — I10 Essential (primary) hypertension: Secondary | ICD-10-CM | POA: Diagnosis not present

## 2023-09-16 DIAGNOSIS — N184 Chronic kidney disease, stage 4 (severe): Secondary | ICD-10-CM | POA: Diagnosis not present

## 2023-09-16 DIAGNOSIS — D509 Iron deficiency anemia, unspecified: Secondary | ICD-10-CM | POA: Diagnosis not present

## 2023-09-16 DIAGNOSIS — E785 Hyperlipidemia, unspecified: Secondary | ICD-10-CM | POA: Diagnosis not present

## 2023-09-16 DIAGNOSIS — I5022 Chronic systolic (congestive) heart failure: Secondary | ICD-10-CM | POA: Diagnosis not present

## 2023-09-18 DIAGNOSIS — N184 Chronic kidney disease, stage 4 (severe): Secondary | ICD-10-CM | POA: Diagnosis not present

## 2023-09-18 DIAGNOSIS — E785 Hyperlipidemia, unspecified: Secondary | ICD-10-CM | POA: Diagnosis not present

## 2023-09-18 DIAGNOSIS — I5022 Chronic systolic (congestive) heart failure: Secondary | ICD-10-CM | POA: Diagnosis not present

## 2023-09-18 DIAGNOSIS — I480 Paroxysmal atrial fibrillation: Secondary | ICD-10-CM | POA: Diagnosis not present

## 2023-09-18 DIAGNOSIS — I10 Essential (primary) hypertension: Secondary | ICD-10-CM | POA: Diagnosis not present

## 2023-09-18 DIAGNOSIS — D509 Iron deficiency anemia, unspecified: Secondary | ICD-10-CM | POA: Diagnosis not present

## 2023-09-21 DIAGNOSIS — I5022 Chronic systolic (congestive) heart failure: Secondary | ICD-10-CM | POA: Diagnosis not present

## 2023-09-21 DIAGNOSIS — I480 Paroxysmal atrial fibrillation: Secondary | ICD-10-CM | POA: Diagnosis not present

## 2023-09-21 DIAGNOSIS — D509 Iron deficiency anemia, unspecified: Secondary | ICD-10-CM | POA: Diagnosis not present

## 2023-09-21 DIAGNOSIS — E785 Hyperlipidemia, unspecified: Secondary | ICD-10-CM | POA: Diagnosis not present

## 2023-09-21 DIAGNOSIS — N184 Chronic kidney disease, stage 4 (severe): Secondary | ICD-10-CM | POA: Diagnosis not present

## 2023-09-21 DIAGNOSIS — I10 Essential (primary) hypertension: Secondary | ICD-10-CM | POA: Diagnosis not present

## 2023-09-23 DIAGNOSIS — I10 Essential (primary) hypertension: Secondary | ICD-10-CM | POA: Diagnosis not present

## 2023-09-23 DIAGNOSIS — I5022 Chronic systolic (congestive) heart failure: Secondary | ICD-10-CM | POA: Diagnosis not present

## 2023-09-23 DIAGNOSIS — I480 Paroxysmal atrial fibrillation: Secondary | ICD-10-CM | POA: Diagnosis not present

## 2023-09-23 DIAGNOSIS — N184 Chronic kidney disease, stage 4 (severe): Secondary | ICD-10-CM | POA: Diagnosis not present

## 2023-09-23 DIAGNOSIS — E785 Hyperlipidemia, unspecified: Secondary | ICD-10-CM | POA: Diagnosis not present

## 2023-09-23 DIAGNOSIS — D509 Iron deficiency anemia, unspecified: Secondary | ICD-10-CM | POA: Diagnosis not present

## 2023-09-25 DIAGNOSIS — D509 Iron deficiency anemia, unspecified: Secondary | ICD-10-CM | POA: Diagnosis not present

## 2023-09-25 DIAGNOSIS — I5022 Chronic systolic (congestive) heart failure: Secondary | ICD-10-CM | POA: Diagnosis not present

## 2023-09-25 DIAGNOSIS — I480 Paroxysmal atrial fibrillation: Secondary | ICD-10-CM | POA: Diagnosis not present

## 2023-09-25 DIAGNOSIS — I10 Essential (primary) hypertension: Secondary | ICD-10-CM | POA: Diagnosis not present

## 2023-09-25 DIAGNOSIS — N184 Chronic kidney disease, stage 4 (severe): Secondary | ICD-10-CM | POA: Diagnosis not present

## 2023-09-25 DIAGNOSIS — E785 Hyperlipidemia, unspecified: Secondary | ICD-10-CM | POA: Diagnosis not present

## 2023-09-28 DIAGNOSIS — K219 Gastro-esophageal reflux disease without esophagitis: Secondary | ICD-10-CM | POA: Diagnosis not present

## 2023-09-28 DIAGNOSIS — M109 Gout, unspecified: Secondary | ICD-10-CM | POA: Diagnosis not present

## 2023-09-28 DIAGNOSIS — E118 Type 2 diabetes mellitus with unspecified complications: Secondary | ICD-10-CM | POA: Diagnosis not present

## 2023-09-28 DIAGNOSIS — I10 Essential (primary) hypertension: Secondary | ICD-10-CM | POA: Diagnosis not present

## 2023-09-28 DIAGNOSIS — Z853 Personal history of malignant neoplasm of breast: Secondary | ICD-10-CM | POA: Diagnosis not present

## 2023-09-28 DIAGNOSIS — N184 Chronic kidney disease, stage 4 (severe): Secondary | ICD-10-CM | POA: Diagnosis not present

## 2023-09-28 DIAGNOSIS — D509 Iron deficiency anemia, unspecified: Secondary | ICD-10-CM | POA: Diagnosis not present

## 2023-09-28 DIAGNOSIS — E785 Hyperlipidemia, unspecified: Secondary | ICD-10-CM | POA: Diagnosis not present

## 2023-09-28 DIAGNOSIS — E209 Hypoparathyroidism, unspecified: Secondary | ICD-10-CM | POA: Diagnosis not present

## 2023-09-28 DIAGNOSIS — I519 Heart disease, unspecified: Secondary | ICD-10-CM | POA: Diagnosis not present

## 2023-09-28 DIAGNOSIS — I214 Non-ST elevation (NSTEMI) myocardial infarction: Secondary | ICD-10-CM | POA: Diagnosis not present

## 2023-09-28 DIAGNOSIS — I480 Paroxysmal atrial fibrillation: Secondary | ICD-10-CM | POA: Diagnosis not present

## 2023-09-28 DIAGNOSIS — R531 Weakness: Secondary | ICD-10-CM | POA: Diagnosis not present

## 2023-09-30 DIAGNOSIS — I10 Essential (primary) hypertension: Secondary | ICD-10-CM | POA: Diagnosis not present

## 2023-09-30 DIAGNOSIS — I519 Heart disease, unspecified: Secondary | ICD-10-CM | POA: Diagnosis not present

## 2023-09-30 DIAGNOSIS — D509 Iron deficiency anemia, unspecified: Secondary | ICD-10-CM | POA: Diagnosis not present

## 2023-09-30 DIAGNOSIS — I480 Paroxysmal atrial fibrillation: Secondary | ICD-10-CM | POA: Diagnosis not present

## 2023-09-30 DIAGNOSIS — E785 Hyperlipidemia, unspecified: Secondary | ICD-10-CM | POA: Diagnosis not present

## 2023-09-30 DIAGNOSIS — N184 Chronic kidney disease, stage 4 (severe): Secondary | ICD-10-CM | POA: Diagnosis not present

## 2023-10-02 DIAGNOSIS — D509 Iron deficiency anemia, unspecified: Secondary | ICD-10-CM | POA: Diagnosis not present

## 2023-10-02 DIAGNOSIS — I10 Essential (primary) hypertension: Secondary | ICD-10-CM | POA: Diagnosis not present

## 2023-10-02 DIAGNOSIS — I519 Heart disease, unspecified: Secondary | ICD-10-CM | POA: Diagnosis not present

## 2023-10-02 DIAGNOSIS — E785 Hyperlipidemia, unspecified: Secondary | ICD-10-CM | POA: Diagnosis not present

## 2023-10-02 DIAGNOSIS — N184 Chronic kidney disease, stage 4 (severe): Secondary | ICD-10-CM | POA: Diagnosis not present

## 2023-10-02 DIAGNOSIS — I480 Paroxysmal atrial fibrillation: Secondary | ICD-10-CM | POA: Diagnosis not present

## 2023-10-05 DIAGNOSIS — I519 Heart disease, unspecified: Secondary | ICD-10-CM | POA: Diagnosis not present

## 2023-10-05 DIAGNOSIS — I10 Essential (primary) hypertension: Secondary | ICD-10-CM | POA: Diagnosis not present

## 2023-10-05 DIAGNOSIS — E785 Hyperlipidemia, unspecified: Secondary | ICD-10-CM | POA: Diagnosis not present

## 2023-10-05 DIAGNOSIS — N184 Chronic kidney disease, stage 4 (severe): Secondary | ICD-10-CM | POA: Diagnosis not present

## 2023-10-05 DIAGNOSIS — I480 Paroxysmal atrial fibrillation: Secondary | ICD-10-CM | POA: Diagnosis not present

## 2023-10-05 DIAGNOSIS — D509 Iron deficiency anemia, unspecified: Secondary | ICD-10-CM | POA: Diagnosis not present

## 2023-10-07 DIAGNOSIS — I480 Paroxysmal atrial fibrillation: Secondary | ICD-10-CM | POA: Diagnosis not present

## 2023-10-07 DIAGNOSIS — D509 Iron deficiency anemia, unspecified: Secondary | ICD-10-CM | POA: Diagnosis not present

## 2023-10-07 DIAGNOSIS — E785 Hyperlipidemia, unspecified: Secondary | ICD-10-CM | POA: Diagnosis not present

## 2023-10-07 DIAGNOSIS — N184 Chronic kidney disease, stage 4 (severe): Secondary | ICD-10-CM | POA: Diagnosis not present

## 2023-10-07 DIAGNOSIS — I519 Heart disease, unspecified: Secondary | ICD-10-CM | POA: Diagnosis not present

## 2023-10-07 DIAGNOSIS — I10 Essential (primary) hypertension: Secondary | ICD-10-CM | POA: Diagnosis not present

## 2023-10-09 DIAGNOSIS — I519 Heart disease, unspecified: Secondary | ICD-10-CM | POA: Diagnosis not present

## 2023-10-09 DIAGNOSIS — D509 Iron deficiency anemia, unspecified: Secondary | ICD-10-CM | POA: Diagnosis not present

## 2023-10-09 DIAGNOSIS — I10 Essential (primary) hypertension: Secondary | ICD-10-CM | POA: Diagnosis not present

## 2023-10-09 DIAGNOSIS — I480 Paroxysmal atrial fibrillation: Secondary | ICD-10-CM | POA: Diagnosis not present

## 2023-10-09 DIAGNOSIS — E785 Hyperlipidemia, unspecified: Secondary | ICD-10-CM | POA: Diagnosis not present

## 2023-10-09 DIAGNOSIS — N184 Chronic kidney disease, stage 4 (severe): Secondary | ICD-10-CM | POA: Diagnosis not present

## 2023-10-12 DIAGNOSIS — N184 Chronic kidney disease, stage 4 (severe): Secondary | ICD-10-CM | POA: Diagnosis not present

## 2023-10-12 DIAGNOSIS — I519 Heart disease, unspecified: Secondary | ICD-10-CM | POA: Diagnosis not present

## 2023-10-12 DIAGNOSIS — I480 Paroxysmal atrial fibrillation: Secondary | ICD-10-CM | POA: Diagnosis not present

## 2023-10-12 DIAGNOSIS — D509 Iron deficiency anemia, unspecified: Secondary | ICD-10-CM | POA: Diagnosis not present

## 2023-10-12 DIAGNOSIS — I10 Essential (primary) hypertension: Secondary | ICD-10-CM | POA: Diagnosis not present

## 2023-10-12 DIAGNOSIS — E785 Hyperlipidemia, unspecified: Secondary | ICD-10-CM | POA: Diagnosis not present

## 2023-10-14 DIAGNOSIS — D509 Iron deficiency anemia, unspecified: Secondary | ICD-10-CM | POA: Diagnosis not present

## 2023-10-14 DIAGNOSIS — E785 Hyperlipidemia, unspecified: Secondary | ICD-10-CM | POA: Diagnosis not present

## 2023-10-14 DIAGNOSIS — N184 Chronic kidney disease, stage 4 (severe): Secondary | ICD-10-CM | POA: Diagnosis not present

## 2023-10-14 DIAGNOSIS — I10 Essential (primary) hypertension: Secondary | ICD-10-CM | POA: Diagnosis not present

## 2023-10-14 DIAGNOSIS — I480 Paroxysmal atrial fibrillation: Secondary | ICD-10-CM | POA: Diagnosis not present

## 2023-10-14 DIAGNOSIS — I519 Heart disease, unspecified: Secondary | ICD-10-CM | POA: Diagnosis not present

## 2023-10-16 DIAGNOSIS — N184 Chronic kidney disease, stage 4 (severe): Secondary | ICD-10-CM | POA: Diagnosis not present

## 2023-10-16 DIAGNOSIS — I10 Essential (primary) hypertension: Secondary | ICD-10-CM | POA: Diagnosis not present

## 2023-10-16 DIAGNOSIS — I480 Paroxysmal atrial fibrillation: Secondary | ICD-10-CM | POA: Diagnosis not present

## 2023-10-16 DIAGNOSIS — I519 Heart disease, unspecified: Secondary | ICD-10-CM | POA: Diagnosis not present

## 2023-10-16 DIAGNOSIS — E785 Hyperlipidemia, unspecified: Secondary | ICD-10-CM | POA: Diagnosis not present

## 2023-10-16 DIAGNOSIS — D509 Iron deficiency anemia, unspecified: Secondary | ICD-10-CM | POA: Diagnosis not present

## 2023-10-19 DIAGNOSIS — I10 Essential (primary) hypertension: Secondary | ICD-10-CM | POA: Diagnosis not present

## 2023-10-19 DIAGNOSIS — I519 Heart disease, unspecified: Secondary | ICD-10-CM | POA: Diagnosis not present

## 2023-10-19 DIAGNOSIS — E785 Hyperlipidemia, unspecified: Secondary | ICD-10-CM | POA: Diagnosis not present

## 2023-10-19 DIAGNOSIS — D509 Iron deficiency anemia, unspecified: Secondary | ICD-10-CM | POA: Diagnosis not present

## 2023-10-19 DIAGNOSIS — N184 Chronic kidney disease, stage 4 (severe): Secondary | ICD-10-CM | POA: Diagnosis not present

## 2023-10-19 DIAGNOSIS — I480 Paroxysmal atrial fibrillation: Secondary | ICD-10-CM | POA: Diagnosis not present

## 2023-10-21 DIAGNOSIS — I480 Paroxysmal atrial fibrillation: Secondary | ICD-10-CM | POA: Diagnosis not present

## 2023-10-21 DIAGNOSIS — I10 Essential (primary) hypertension: Secondary | ICD-10-CM | POA: Diagnosis not present

## 2023-10-21 DIAGNOSIS — E785 Hyperlipidemia, unspecified: Secondary | ICD-10-CM | POA: Diagnosis not present

## 2023-10-21 DIAGNOSIS — N184 Chronic kidney disease, stage 4 (severe): Secondary | ICD-10-CM | POA: Diagnosis not present

## 2023-10-21 DIAGNOSIS — D509 Iron deficiency anemia, unspecified: Secondary | ICD-10-CM | POA: Diagnosis not present

## 2023-10-21 DIAGNOSIS — I519 Heart disease, unspecified: Secondary | ICD-10-CM | POA: Diagnosis not present

## 2023-10-22 DIAGNOSIS — I10 Essential (primary) hypertension: Secondary | ICD-10-CM | POA: Diagnosis not present

## 2023-10-22 DIAGNOSIS — D509 Iron deficiency anemia, unspecified: Secondary | ICD-10-CM | POA: Diagnosis not present

## 2023-10-22 DIAGNOSIS — I480 Paroxysmal atrial fibrillation: Secondary | ICD-10-CM | POA: Diagnosis not present

## 2023-10-22 DIAGNOSIS — N184 Chronic kidney disease, stage 4 (severe): Secondary | ICD-10-CM | POA: Diagnosis not present

## 2023-10-22 DIAGNOSIS — E785 Hyperlipidemia, unspecified: Secondary | ICD-10-CM | POA: Diagnosis not present

## 2023-10-22 DIAGNOSIS — I519 Heart disease, unspecified: Secondary | ICD-10-CM | POA: Diagnosis not present

## 2023-10-23 DIAGNOSIS — E785 Hyperlipidemia, unspecified: Secondary | ICD-10-CM | POA: Diagnosis not present

## 2023-10-23 DIAGNOSIS — N184 Chronic kidney disease, stage 4 (severe): Secondary | ICD-10-CM | POA: Diagnosis not present

## 2023-10-23 DIAGNOSIS — D509 Iron deficiency anemia, unspecified: Secondary | ICD-10-CM | POA: Diagnosis not present

## 2023-10-23 DIAGNOSIS — I10 Essential (primary) hypertension: Secondary | ICD-10-CM | POA: Diagnosis not present

## 2023-10-23 DIAGNOSIS — I519 Heart disease, unspecified: Secondary | ICD-10-CM | POA: Diagnosis not present

## 2023-10-23 DIAGNOSIS — I480 Paroxysmal atrial fibrillation: Secondary | ICD-10-CM | POA: Diagnosis not present

## 2023-10-26 DIAGNOSIS — I480 Paroxysmal atrial fibrillation: Secondary | ICD-10-CM | POA: Diagnosis not present

## 2023-10-26 DIAGNOSIS — D509 Iron deficiency anemia, unspecified: Secondary | ICD-10-CM | POA: Diagnosis not present

## 2023-10-26 DIAGNOSIS — N184 Chronic kidney disease, stage 4 (severe): Secondary | ICD-10-CM | POA: Diagnosis not present

## 2023-10-26 DIAGNOSIS — E785 Hyperlipidemia, unspecified: Secondary | ICD-10-CM | POA: Diagnosis not present

## 2023-10-26 DIAGNOSIS — I10 Essential (primary) hypertension: Secondary | ICD-10-CM | POA: Diagnosis not present

## 2023-10-26 DIAGNOSIS — I519 Heart disease, unspecified: Secondary | ICD-10-CM | POA: Diagnosis not present

## 2023-10-28 DIAGNOSIS — K219 Gastro-esophageal reflux disease without esophagitis: Secondary | ICD-10-CM | POA: Diagnosis not present

## 2023-10-28 DIAGNOSIS — R531 Weakness: Secondary | ICD-10-CM | POA: Diagnosis not present

## 2023-10-28 DIAGNOSIS — I480 Paroxysmal atrial fibrillation: Secondary | ICD-10-CM | POA: Diagnosis not present

## 2023-10-28 DIAGNOSIS — E209 Hypoparathyroidism, unspecified: Secondary | ICD-10-CM | POA: Diagnosis not present

## 2023-10-28 DIAGNOSIS — I214 Non-ST elevation (NSTEMI) myocardial infarction: Secondary | ICD-10-CM | POA: Diagnosis not present

## 2023-10-28 DIAGNOSIS — Z853 Personal history of malignant neoplasm of breast: Secondary | ICD-10-CM | POA: Diagnosis not present

## 2023-10-28 DIAGNOSIS — I10 Essential (primary) hypertension: Secondary | ICD-10-CM | POA: Diagnosis not present

## 2023-10-28 DIAGNOSIS — E118 Type 2 diabetes mellitus with unspecified complications: Secondary | ICD-10-CM | POA: Diagnosis not present

## 2023-10-28 DIAGNOSIS — D509 Iron deficiency anemia, unspecified: Secondary | ICD-10-CM | POA: Diagnosis not present

## 2023-10-28 DIAGNOSIS — I519 Heart disease, unspecified: Secondary | ICD-10-CM | POA: Diagnosis not present

## 2023-10-28 DIAGNOSIS — N184 Chronic kidney disease, stage 4 (severe): Secondary | ICD-10-CM | POA: Diagnosis not present

## 2023-10-28 DIAGNOSIS — M109 Gout, unspecified: Secondary | ICD-10-CM | POA: Diagnosis not present

## 2023-10-28 DIAGNOSIS — E785 Hyperlipidemia, unspecified: Secondary | ICD-10-CM | POA: Diagnosis not present

## 2023-10-30 DIAGNOSIS — I480 Paroxysmal atrial fibrillation: Secondary | ICD-10-CM | POA: Diagnosis not present

## 2023-10-30 DIAGNOSIS — N184 Chronic kidney disease, stage 4 (severe): Secondary | ICD-10-CM | POA: Diagnosis not present

## 2023-10-30 DIAGNOSIS — I519 Heart disease, unspecified: Secondary | ICD-10-CM | POA: Diagnosis not present

## 2023-10-30 DIAGNOSIS — E785 Hyperlipidemia, unspecified: Secondary | ICD-10-CM | POA: Diagnosis not present

## 2023-10-30 DIAGNOSIS — I10 Essential (primary) hypertension: Secondary | ICD-10-CM | POA: Diagnosis not present

## 2023-10-30 DIAGNOSIS — D509 Iron deficiency anemia, unspecified: Secondary | ICD-10-CM | POA: Diagnosis not present

## 2023-11-02 DIAGNOSIS — I10 Essential (primary) hypertension: Secondary | ICD-10-CM | POA: Diagnosis not present

## 2023-11-02 DIAGNOSIS — I519 Heart disease, unspecified: Secondary | ICD-10-CM | POA: Diagnosis not present

## 2023-11-02 DIAGNOSIS — D509 Iron deficiency anemia, unspecified: Secondary | ICD-10-CM | POA: Diagnosis not present

## 2023-11-02 DIAGNOSIS — E785 Hyperlipidemia, unspecified: Secondary | ICD-10-CM | POA: Diagnosis not present

## 2023-11-02 DIAGNOSIS — N184 Chronic kidney disease, stage 4 (severe): Secondary | ICD-10-CM | POA: Diagnosis not present

## 2023-11-02 DIAGNOSIS — I480 Paroxysmal atrial fibrillation: Secondary | ICD-10-CM | POA: Diagnosis not present

## 2023-11-04 DIAGNOSIS — I4891 Unspecified atrial fibrillation: Secondary | ICD-10-CM | POA: Diagnosis not present

## 2023-11-04 DIAGNOSIS — I10 Essential (primary) hypertension: Secondary | ICD-10-CM | POA: Diagnosis not present

## 2023-11-04 DIAGNOSIS — I5022 Chronic systolic (congestive) heart failure: Secondary | ICD-10-CM | POA: Diagnosis not present

## 2023-11-04 DIAGNOSIS — Z299 Encounter for prophylactic measures, unspecified: Secondary | ICD-10-CM | POA: Diagnosis not present

## 2023-11-04 DIAGNOSIS — F322 Major depressive disorder, single episode, severe without psychotic features: Secondary | ICD-10-CM | POA: Diagnosis not present

## 2023-11-04 DIAGNOSIS — N184 Chronic kidney disease, stage 4 (severe): Secondary | ICD-10-CM | POA: Diagnosis not present

## 2023-11-24 DIAGNOSIS — E7800 Pure hypercholesterolemia, unspecified: Secondary | ICD-10-CM | POA: Diagnosis not present

## 2023-11-24 DIAGNOSIS — R5383 Other fatigue: Secondary | ICD-10-CM | POA: Diagnosis not present

## 2023-11-24 DIAGNOSIS — Z79899 Other long term (current) drug therapy: Secondary | ICD-10-CM | POA: Diagnosis not present

## 2023-11-24 DIAGNOSIS — E559 Vitamin D deficiency, unspecified: Secondary | ICD-10-CM | POA: Diagnosis not present

## 2023-11-25 DIAGNOSIS — Z0000 Encounter for general adult medical examination without abnormal findings: Secondary | ICD-10-CM | POA: Diagnosis not present

## 2023-11-25 DIAGNOSIS — I10 Essential (primary) hypertension: Secondary | ICD-10-CM | POA: Diagnosis not present

## 2023-11-25 DIAGNOSIS — I4891 Unspecified atrial fibrillation: Secondary | ICD-10-CM | POA: Diagnosis not present

## 2023-11-25 DIAGNOSIS — I5022 Chronic systolic (congestive) heart failure: Secondary | ICD-10-CM | POA: Diagnosis not present

## 2023-11-25 DIAGNOSIS — E7800 Pure hypercholesterolemia, unspecified: Secondary | ICD-10-CM | POA: Diagnosis not present

## 2023-11-25 DIAGNOSIS — Z1339 Encounter for screening examination for other mental health and behavioral disorders: Secondary | ICD-10-CM | POA: Diagnosis not present

## 2023-11-25 DIAGNOSIS — Z299 Encounter for prophylactic measures, unspecified: Secondary | ICD-10-CM | POA: Diagnosis not present

## 2023-11-25 DIAGNOSIS — Z1331 Encounter for screening for depression: Secondary | ICD-10-CM | POA: Diagnosis not present

## 2023-11-25 DIAGNOSIS — Z7189 Other specified counseling: Secondary | ICD-10-CM | POA: Diagnosis not present

## 2023-12-31 DIAGNOSIS — I10 Essential (primary) hypertension: Secondary | ICD-10-CM | POA: Diagnosis not present

## 2023-12-31 DIAGNOSIS — Z299 Encounter for prophylactic measures, unspecified: Secondary | ICD-10-CM | POA: Diagnosis not present

## 2023-12-31 DIAGNOSIS — N184 Chronic kidney disease, stage 4 (severe): Secondary | ICD-10-CM | POA: Diagnosis not present

## 2023-12-31 DIAGNOSIS — I4891 Unspecified atrial fibrillation: Secondary | ICD-10-CM | POA: Diagnosis not present

## 2023-12-31 DIAGNOSIS — I5022 Chronic systolic (congestive) heart failure: Secondary | ICD-10-CM | POA: Diagnosis not present

## 2024-01-06 DIAGNOSIS — D485 Neoplasm of uncertain behavior of skin: Secondary | ICD-10-CM | POA: Diagnosis not present

## 2024-01-13 ENCOUNTER — Other Ambulatory Visit (HOSPITAL_COMMUNITY)
Admission: RE | Admit: 2024-01-13 | Discharge: 2024-01-13 | Disposition: A | Discharge status: Home / Self Care | Source: Ambulatory Visit | Attending: Nephrology | Admitting: Nephrology

## 2024-01-13 DIAGNOSIS — N189 Chronic kidney disease, unspecified: Secondary | ICD-10-CM | POA: Insufficient documentation

## 2024-01-13 DIAGNOSIS — E211 Secondary hyperparathyroidism, not elsewhere classified: Secondary | ICD-10-CM | POA: Insufficient documentation

## 2024-01-13 DIAGNOSIS — E1122 Type 2 diabetes mellitus with diabetic chronic kidney disease: Secondary | ICD-10-CM | POA: Diagnosis not present

## 2024-01-13 DIAGNOSIS — D631 Anemia in chronic kidney disease: Secondary | ICD-10-CM | POA: Diagnosis not present

## 2024-01-13 DIAGNOSIS — R809 Proteinuria, unspecified: Secondary | ICD-10-CM | POA: Diagnosis not present

## 2024-01-13 DIAGNOSIS — I129 Hypertensive chronic kidney disease with stage 1 through stage 4 chronic kidney disease, or unspecified chronic kidney disease: Secondary | ICD-10-CM | POA: Diagnosis not present

## 2024-01-13 DIAGNOSIS — I10 Essential (primary) hypertension: Secondary | ICD-10-CM | POA: Diagnosis present

## 2024-01-13 DIAGNOSIS — E119 Type 2 diabetes mellitus without complications: Secondary | ICD-10-CM | POA: Diagnosis present

## 2024-01-13 LAB — CBC
HCT: 45.2 % (ref 36.0–46.0)
Hemoglobin: 14.3 g/dL (ref 12.0–15.0)
MCH: 27.6 pg (ref 26.0–34.0)
MCHC: 31.6 g/dL (ref 30.0–36.0)
MCV: 87.1 fL (ref 80.0–100.0)
Platelets: 281 K/uL (ref 150–400)
RBC: 5.19 MIL/uL — ABNORMAL HIGH (ref 3.87–5.11)
RDW: 15.9 % — ABNORMAL HIGH (ref 11.5–15.5)
WBC: 10.6 K/uL — ABNORMAL HIGH (ref 4.0–10.5)
nRBC: 0 % (ref 0.0–0.2)

## 2024-01-13 LAB — RENAL FUNCTION PANEL
Albumin: 4.2 g/dL (ref 3.5–5.0)
Anion gap: 18 — ABNORMAL HIGH (ref 5–15)
BUN: 56 mg/dL — ABNORMAL HIGH (ref 8–23)
CO2: 20 mmol/L — ABNORMAL LOW (ref 22–32)
Calcium: 9.7 mg/dL (ref 8.9–10.3)
Chloride: 100 mmol/L (ref 98–111)
Creatinine, Ser: 2.46 mg/dL — ABNORMAL HIGH (ref 0.44–1.00)
GFR, Estimated: 18 mL/min — ABNORMAL LOW (ref >60)
Glucose, Bld: 185 mg/dL — ABNORMAL HIGH (ref 70–99)
Phosphorus: 4.4 mg/dL (ref 2.5–4.6)
Potassium: 4.8 mmol/L (ref 3.5–5.1)
Sodium: 138 mmol/L (ref 135–145)

## 2024-01-14 LAB — MICROALBUMIN / CREATININE URINE RATIO
Creatinine, Urine: 64.8 mg/dL
Microalb Creat Ratio: 1169 mg/g{creat} — ABNORMAL HIGH (ref 0–29)
Microalb, Ur: 757.7 ug/mL — ABNORMAL HIGH

## 2024-01-14 LAB — PARATHYROID HORMONE, INTACT (NO CA): PTH: 48 pg/mL (ref 15–65)

## 2024-02-01 ENCOUNTER — Inpatient Hospital Stay (HOSPITAL_COMMUNITY)

## 2024-02-01 ENCOUNTER — Encounter (HOSPITAL_COMMUNITY): Payer: Self-pay

## 2024-02-01 ENCOUNTER — Emergency Department (HOSPITAL_COMMUNITY)

## 2024-02-01 ENCOUNTER — Inpatient Hospital Stay (HOSPITAL_COMMUNITY)
Admission: EM | Admit: 2024-02-01 | Discharge: 2024-02-06 | DRG: 193 | Disposition: A | Discharge status: Rehabilitation Facility | Attending: Hospitalist | Admitting: Hospitalist

## 2024-02-01 ENCOUNTER — Other Ambulatory Visit: Payer: Self-pay

## 2024-02-01 DIAGNOSIS — J102 Influenza due to other identified influenza virus with gastrointestinal manifestations: Secondary | ICD-10-CM | POA: Diagnosis present

## 2024-02-01 DIAGNOSIS — I10 Essential (primary) hypertension: Secondary | ICD-10-CM | POA: Diagnosis not present

## 2024-02-01 DIAGNOSIS — M109 Gout, unspecified: Secondary | ICD-10-CM | POA: Diagnosis present

## 2024-02-01 DIAGNOSIS — E782 Mixed hyperlipidemia: Secondary | ICD-10-CM | POA: Diagnosis present

## 2024-02-01 DIAGNOSIS — L89152 Pressure ulcer of sacral region, stage 2: Secondary | ICD-10-CM | POA: Diagnosis present

## 2024-02-01 DIAGNOSIS — N185 Chronic kidney disease, stage 5: Secondary | ICD-10-CM | POA: Diagnosis present

## 2024-02-01 DIAGNOSIS — J45909 Unspecified asthma, uncomplicated: Secondary | ICD-10-CM | POA: Diagnosis present

## 2024-02-01 DIAGNOSIS — I252 Old myocardial infarction: Secondary | ICD-10-CM

## 2024-02-01 DIAGNOSIS — R059 Cough, unspecified: Secondary | ICD-10-CM | POA: Diagnosis present

## 2024-02-01 DIAGNOSIS — R0902 Hypoxemia: Principal | ICD-10-CM

## 2024-02-01 DIAGNOSIS — I132 Hypertensive heart and chronic kidney disease with heart failure and with stage 5 chronic kidney disease, or end stage renal disease: Secondary | ICD-10-CM | POA: Diagnosis present

## 2024-02-01 DIAGNOSIS — N390 Urinary tract infection, site not specified: Secondary | ICD-10-CM | POA: Diagnosis present

## 2024-02-01 DIAGNOSIS — D631 Anemia in chronic kidney disease: Secondary | ICD-10-CM | POA: Diagnosis present

## 2024-02-01 DIAGNOSIS — I480 Paroxysmal atrial fibrillation: Secondary | ICD-10-CM | POA: Diagnosis present

## 2024-02-01 DIAGNOSIS — R296 Repeated falls: Secondary | ICD-10-CM | POA: Diagnosis present

## 2024-02-01 DIAGNOSIS — Z888 Allergy status to other drugs, medicaments and biological substances status: Secondary | ICD-10-CM

## 2024-02-01 DIAGNOSIS — Z7901 Long term (current) use of anticoagulants: Secondary | ICD-10-CM

## 2024-02-01 DIAGNOSIS — J189 Pneumonia, unspecified organism: Secondary | ICD-10-CM

## 2024-02-01 DIAGNOSIS — L8990 Pressure ulcer of unspecified site, unspecified stage: Secondary | ICD-10-CM | POA: Insufficient documentation

## 2024-02-01 DIAGNOSIS — Z823 Family history of stroke: Secondary | ICD-10-CM

## 2024-02-01 DIAGNOSIS — Z1611 Resistance to penicillins: Secondary | ICD-10-CM | POA: Diagnosis present

## 2024-02-01 DIAGNOSIS — R230 Cyanosis: Secondary | ICD-10-CM | POA: Diagnosis not present

## 2024-02-01 DIAGNOSIS — J9601 Acute respiratory failure with hypoxia: Secondary | ICD-10-CM | POA: Diagnosis present

## 2024-02-01 DIAGNOSIS — Z9011 Acquired absence of right breast and nipple: Secondary | ICD-10-CM

## 2024-02-01 DIAGNOSIS — E43 Unspecified severe protein-calorie malnutrition: Secondary | ICD-10-CM | POA: Diagnosis present

## 2024-02-01 DIAGNOSIS — E119 Type 2 diabetes mellitus without complications: Secondary | ICD-10-CM

## 2024-02-01 DIAGNOSIS — J1001 Influenza due to other identified influenza virus with the same other identified influenza virus pneumonia: Secondary | ICD-10-CM | POA: Diagnosis present

## 2024-02-01 DIAGNOSIS — E1122 Type 2 diabetes mellitus with diabetic chronic kidney disease: Secondary | ICD-10-CM | POA: Diagnosis present

## 2024-02-01 DIAGNOSIS — Z8249 Family history of ischemic heart disease and other diseases of the circulatory system: Secondary | ICD-10-CM

## 2024-02-01 DIAGNOSIS — K219 Gastro-esophageal reflux disease without esophagitis: Secondary | ICD-10-CM | POA: Diagnosis present

## 2024-02-01 DIAGNOSIS — J101 Influenza due to other identified influenza virus with other respiratory manifestations: Secondary | ICD-10-CM | POA: Diagnosis not present

## 2024-02-01 DIAGNOSIS — Z882 Allergy status to sulfonamides status: Secondary | ICD-10-CM

## 2024-02-01 DIAGNOSIS — B9620 Unspecified Escherichia coli [E. coli] as the cause of diseases classified elsewhere: Secondary | ICD-10-CM | POA: Diagnosis present

## 2024-02-01 DIAGNOSIS — I5042 Chronic combined systolic (congestive) and diastolic (congestive) heart failure: Secondary | ICD-10-CM | POA: Diagnosis present

## 2024-02-01 DIAGNOSIS — Z881 Allergy status to other antibiotic agents status: Secondary | ICD-10-CM

## 2024-02-01 DIAGNOSIS — Z681 Body mass index (BMI) 19 or less, adult: Secondary | ICD-10-CM

## 2024-02-01 DIAGNOSIS — I2510 Atherosclerotic heart disease of native coronary artery without angina pectoris: Secondary | ICD-10-CM | POA: Diagnosis present

## 2024-02-01 DIAGNOSIS — Z79899 Other long term (current) drug therapy: Secondary | ICD-10-CM

## 2024-02-01 DIAGNOSIS — N184 Chronic kidney disease, stage 4 (severe): Secondary | ICD-10-CM | POA: Diagnosis not present

## 2024-02-01 DIAGNOSIS — Z85828 Personal history of other malignant neoplasm of skin: Secondary | ICD-10-CM

## 2024-02-01 DIAGNOSIS — M8580 Other specified disorders of bone density and structure, unspecified site: Secondary | ICD-10-CM | POA: Diagnosis present

## 2024-02-01 DIAGNOSIS — Z853 Personal history of malignant neoplasm of breast: Secondary | ICD-10-CM

## 2024-02-01 DIAGNOSIS — Z66 Do not resuscitate: Secondary | ICD-10-CM | POA: Diagnosis present

## 2024-02-01 LAB — COMPREHENSIVE METABOLIC PANEL WITH GFR
ALT: 16 U/L (ref 0–44)
AST: 20 U/L (ref 15–41)
Albumin: 3.6 g/dL (ref 3.5–5.0)
Alkaline Phosphatase: 142 U/L — ABNORMAL HIGH (ref 38–126)
Anion gap: 18 — ABNORMAL HIGH (ref 5–15)
BUN: 56 mg/dL — ABNORMAL HIGH (ref 8–23)
CO2: 20 mmol/L — ABNORMAL LOW (ref 22–32)
Calcium: 9.5 mg/dL (ref 8.9–10.3)
Chloride: 97 mmol/L — ABNORMAL LOW (ref 98–111)
Creatinine, Ser: 2.39 mg/dL — ABNORMAL HIGH (ref 0.44–1.00)
GFR, Estimated: 19 mL/min — ABNORMAL LOW
Glucose, Bld: 188 mg/dL — ABNORMAL HIGH (ref 70–99)
Potassium: 3.3 mmol/L — ABNORMAL LOW (ref 3.5–5.1)
Sodium: 136 mmol/L (ref 135–145)
Total Bilirubin: 0.5 mg/dL (ref 0.0–1.2)
Total Protein: 7.6 g/dL (ref 6.5–8.1)

## 2024-02-01 LAB — RESP PANEL BY RT-PCR (RSV, FLU A&B, COVID)  RVPGX2
Influenza A by PCR: POSITIVE — AB
Influenza B by PCR: NEGATIVE
Resp Syncytial Virus by PCR: NEGATIVE
SARS Coronavirus 2 by RT PCR: NEGATIVE

## 2024-02-01 LAB — URINALYSIS, W/ REFLEX TO CULTURE (INFECTION SUSPECTED)
Bilirubin Urine: NEGATIVE
Glucose, UA: NEGATIVE mg/dL
Hgb urine dipstick: NEGATIVE
Ketones, ur: NEGATIVE mg/dL
Nitrite: NEGATIVE
Protein, ur: 100 mg/dL — AB
Specific Gravity, Urine: 1.011 (ref 1.005–1.030)
pH: 5 (ref 5.0–8.0)

## 2024-02-01 LAB — CBC WITH DIFFERENTIAL/PLATELET
Abs Immature Granulocytes: 0.45 K/uL — ABNORMAL HIGH (ref 0.00–0.07)
Basophils Absolute: 0.1 K/uL (ref 0.0–0.1)
Basophils Relative: 0 %
Eosinophils Absolute: 0 K/uL (ref 0.0–0.5)
Eosinophils Relative: 0 %
HCT: 38.7 % (ref 36.0–46.0)
Hemoglobin: 12.9 g/dL (ref 12.0–15.0)
Immature Granulocytes: 2 %
Lymphocytes Relative: 2 %
Lymphs Abs: 0.4 K/uL — ABNORMAL LOW (ref 0.7–4.0)
MCH: 26.9 pg (ref 26.0–34.0)
MCHC: 33.3 g/dL (ref 30.0–36.0)
MCV: 80.6 fL (ref 80.0–100.0)
Monocytes Absolute: 1.1 K/uL — ABNORMAL HIGH (ref 0.1–1.0)
Monocytes Relative: 5 %
Neutro Abs: 20.4 K/uL — ABNORMAL HIGH (ref 1.7–7.7)
Neutrophils Relative %: 91 %
Platelets: 509 K/uL — ABNORMAL HIGH (ref 150–400)
RBC: 4.8 MIL/uL (ref 3.87–5.11)
RDW: 16.1 % — ABNORMAL HIGH (ref 11.5–15.5)
WBC: 22.5 K/uL — ABNORMAL HIGH (ref 4.0–10.5)
nRBC: 0 % (ref 0.0–0.2)

## 2024-02-01 LAB — PROTIME-INR
INR: 1 (ref 0.8–1.2)
Prothrombin Time: 13.6 s (ref 11.4–15.2)

## 2024-02-01 LAB — PRO BRAIN NATRIURETIC PEPTIDE: Pro Brain Natriuretic Peptide: 32275 pg/mL — ABNORMAL HIGH

## 2024-02-01 LAB — LACTIC ACID, PLASMA: Lactic Acid, Venous: 1.5 mmol/L (ref 0.5–1.9)

## 2024-02-01 MED ORDER — SODIUM CHLORIDE 0.9 % IV SOLN
1.0000 g | Freq: Once | INTRAVENOUS | Status: AC
  Administered 2024-02-01: 1 g via INTRAVENOUS
  Filled 2024-02-01: qty 10

## 2024-02-01 MED ORDER — ACETAMINOPHEN 650 MG RE SUPP: 650.0000 mg | Freq: Four times a day (QID) | RECTAL | Status: DC | PRN

## 2024-02-01 MED ORDER — TORSEMIDE 20 MG PO TABS
20.0000 mg | ORAL_TABLET | Freq: Every day | ORAL | Status: DC
  Administered 2024-02-02 – 2024-02-06 (×5): 20 mg via ORAL
  Filled 2024-02-01 (×5): qty 1

## 2024-02-01 MED ORDER — POLYETHYLENE GLYCOL 3350 17 G PO PACK: 17.0000 g | PACK | Freq: Every day | ORAL | Status: DC | PRN

## 2024-02-01 MED ORDER — HEPARIN SODIUM (PORCINE) 5000 UNIT/ML IJ SOLN
5000.0000 [IU] | Freq: Three times a day (TID) | INTRAMUSCULAR | Status: DC
  Administered 2024-02-01 – 2024-02-06 (×14): 5000 [IU] via SUBCUTANEOUS
  Filled 2024-02-01 (×14): qty 1

## 2024-02-01 MED ORDER — ALBUTEROL SULFATE (2.5 MG/3ML) 0.083% IN NEBU
2.5000 mg | INHALATION_SOLUTION | RESPIRATORY_TRACT | Status: DC | PRN
  Administered 2024-02-02 – 2024-02-05 (×6): 2.5 mg via RESPIRATORY_TRACT
  Filled 2024-02-01 (×6): qty 3

## 2024-02-01 MED ORDER — LABETALOL HCL 5 MG/ML IV SOLN
10.0000 mg | INTRAVENOUS | Status: DC | PRN
  Administered 2024-02-01 – 2024-02-06 (×10): 10 mg via INTRAVENOUS
  Filled 2024-02-01 (×9): qty 4

## 2024-02-01 MED ORDER — SODIUM CHLORIDE 0.9 % IV SOLN: 12.5000 mg | Freq: Four times a day (QID) | INTRAVENOUS | Status: DC | PRN

## 2024-02-01 MED ORDER — GUAIFENESIN-DM 100-10 MG/5ML PO SYRP
15.0000 mL | ORAL_SOLUTION | Freq: Three times a day (TID) | ORAL | Status: DC
  Administered 2024-02-01 – 2024-02-02 (×2): 15 mL via ORAL
  Filled 2024-02-01 (×2): qty 15

## 2024-02-01 MED ORDER — ACETAMINOPHEN 325 MG PO TABS: 650.0000 mg | ORAL_TABLET | Freq: Four times a day (QID) | ORAL | Status: DC | PRN

## 2024-02-01 MED ORDER — DOXYCYCLINE HYCLATE 100 MG PO TABS
100.0000 mg | ORAL_TABLET | Freq: Once | ORAL | Status: AC
  Administered 2024-02-01: 100 mg via ORAL
  Filled 2024-02-01: qty 1

## 2024-02-01 NOTE — Progress Notes (Signed)
 Nurse at bedside,patient received from ED to room 338,department 300.Patient alert and oriented to person,place,and situation, a little confused to time.Blood pressure 189/66,Dr Ejiroghene Emokpae notified. Plan of care on going.

## 2024-02-01 NOTE — H&P (Signed)
 " History and Physical    Crystal [name] FMW:[id number] DOB: 10-29-1934 DOA: 02/01/2024  PCP: Rosamond Leta NOVAK, MD   Patient coming from: Home  I have personally briefly reviewed patient's old medical records in [facility]  Chief Complaint: Difficulty breathing  HPI: Crystal [name] is a 89 y.o. female with medical history significant for systolic and diastolic CHF, CKD 5, hypertension, diabetes mellitus, coronary artery disease, gout. Patient presented to the ED with complaints of generalized weakness of 2 weeks duration, with cough productive of brownish sputum, nausea and vomiting.  She reports she has not been able to keep anything down.  No abdominal pain no chest pain.  No lower extremity swelling or abdominal bloating.  She has lost weight over the past 2 weeks because she has been ill and not eating, she does not check her weight regularly.  She reports her weight is down to 101.  Has been compliant with torsemide .  She is no longer on Eliquis . She reports she last fell about a week and a half ago.  In December she fell about 6 times.  She lives with her niece.  Ambulates with a walker at baseline.  ED Course: Temperature 98.7.  Heart rate 71-76.  Respirate rate 18-22.  Blood pressure systolic 180s.  O2 sats down to 84% on room air, placed on 2 L sats 93%. WBC 22.5. COVID influenza RSV pending Lactic acid 1.5. Chest x-ray shows cardiomegaly, central vascular congestion, lower lobes with scarring or atelectasis in the right infrahilar lung.  IV ceftriaxone  and doxycycline  started. Hospitalist to admit for acute hypoxic respiratory failure, possibly pneumonia.  Review of Systems: As per HPI all other systems reviewed and negative.  Past Medical History:  Diagnosis Date   Anemia    Bell's palsy    Breast cancer (HCC) 1998   Right mastectomy   CAD (coronary artery disease)    a. s/p STEMI in 01/2019 with DES to LAD b. s/p NSTEMI in 12/2019 with DES to RCA c. cath in  01/2020 showing patent stents   CHF (congestive heart failure) (HCC)    a. EF 30-35% in 01/2019 b. 40-45% in 01/2020 c. EF at 65-70% by echo in 05/2020   Chronic kidney disease    Essential hypertension    GERD (gastroesophageal reflux disease)    Gout    History of skin cancer    Squamous cell, left shoulder   Mixed hyperlipidemia    NSTEMI (non-ST elevated myocardial infarction) (HCC)    12/2019   Osteopenia    Paroxysmal atrial fibrillation (HCC)    Thyroid  nodule    Type 2 diabetes mellitus (HCC)     Past Surgical History:  Procedure Laterality Date   APPENDECTOMY     BIOPSY  10/13/2019   Procedure: BIOPSY;  Surgeon: Shaaron Lamar HERO, MD;  Location: [facility];  Service: Endoscopy;;   CATARACT EXTRACTION  2016   COLONOSCOPY  2019   Dr Ivery   CORONARY ANGIOGRAPHY N/A 01/16/2020   Procedure: CORONARY ANGIOGRAPHY;  Surgeon: Dann Candyce RAMAN, MD;  Location: [facility] INVASIVE CV LAB;  Service: Cardiovascular;  Laterality: N/A;   CORONARY STENT INTERVENTION N/A 01/16/2020   Procedure: CORONARY STENT INTERVENTION;  Surgeon: Dann Candyce RAMAN, MD;  Location: [facility] INVASIVE CV LAB;  Service: Cardiovascular;  Laterality: N/A;   CORONARY ULTRASOUND/IVUS N/A 01/16/2020   Procedure: Intravascular Ultrasound/IVUS;  Surgeon: Dann Candyce RAMAN, MD;  Location: [facility] INVASIVE CV LAB;  Service: Cardiovascular;  Laterality: N/A;  CORONARY/GRAFT ACUTE MI REVASCULARIZATION N/A 01/30/2019   Procedure: Coronary/Graft Acute MI Revascularization;  Surgeon: Verlin Lonni BIRCH, MD;  Location: [facility] INVASIVE CV LAB;  Service: Cardiovascular;  Laterality: N/A;   ESOPHAGOGASTRODUODENOSCOPY (EGD) WITH PROPOFOL  N/A 10/13/2019   Non-obstructing Schatzki ring at GE junction, s/p dilation, erosive gastropathy with stigmata of recent bleeding, normal duodenum. Negative H.pylori.    HYSTEROSCOPY     LAPAROSCOPIC APPENDECTOMY N/A 08/03/2020   Procedure: APPENDECTOMY LAPAROSCOPIC;  Surgeon: Lane Shope,  MD;  Location: [facility] ORS;  Service: General;  Laterality: N/A;   LEFT HEART CATH AND CORONARY ANGIOGRAPHY N/A 01/30/2019   Procedure: LEFT HEART CATH AND CORONARY ANGIOGRAPHY;  Surgeon: Verlin Lonni BIRCH, MD;  Location: [facility] INVASIVE CV LAB;  Service: Cardiovascular;  Laterality: N/A;   LEFT HEART CATH AND CORONARY ANGIOGRAPHY N/A 01/16/2020   Procedure: LEFT HEART CATH AND CORONARY ANGIOGRAPHY;  Surgeon: Dann Candyce RAMAN, MD;  Location: [facility] INVASIVE CV LAB;  Service: Cardiovascular;  Laterality: N/A;   MALONEY DILATION N/A 10/13/2019   Procedure: AGAPITO DILATION;  Surgeon: Shaaron Lamar HERO, MD;  Location: [facility];  Service: Endoscopy;  Laterality: N/A;   Right mastectomy  1998   [place]   RIGHT/LEFT HEART CATH AND CORONARY ANGIOGRAPHY N/A 02/13/2020   Procedure: RIGHT/LEFT HEART CATH AND CORONARY ANGIOGRAPHY;  Surgeon: Jordan, Peter M, MD;  Location: [facility] INVASIVE CV LAB;  Service: Cardiovascular;  Laterality: N/A;     reports that she has never smoked. She has been exposed to tobacco smoke. She has never used smokeless tobacco. She reports that she does not drink alcohol  and does not use drugs.  Allergies[1]  Family History  Problem Relation Age of Onset   Stroke Mother    Heart attack Father    Aneurysm Sister    Heart attack Maternal Uncle    Heart disease Maternal Uncle    Heart attack Paternal Aunt    Heart disease Paternal Aunt    Heart attack Paternal Grandfather    Heart disease Paternal Grandfather    Heart attack Paternal Aunt    Heart disease Paternal Aunt    Heart attack Maternal Uncle    Heart disease Maternal Uncle    Heart attack Maternal Uncle    Heart disease Maternal Uncle    Heart attack Sister    Heart disease Sister    Cancer Sister    Colon cancer Neg Hx    Colon polyps Neg Hx     Prior to Admission medications  Medication Sig Start Date End Date Taking? Authorizing Provider  acetaminophen  (TYLENOL ) 500 MG tablet Take 500 mg by mouth every 6  (six) hours as needed for headache (pain).    [provider]  amiodarone  (PACERONE ) 200 MG tablet Take 0.5 tablets (100 mg total) by mouth daily. 10/07/22   Debera Jayson MATSU, MD  CALCIUM  CARB-CHOLECALCIFEROL  PO Take 1 tablet by mouth daily.    [provider]  cholecalciferol  (VITAMIN D3) 25 MCG (1000 UT) tablet Take 1,000 Units by mouth daily with breakfast.    [provider]  diazepam  (VALIUM ) 2 MG tablet Take 2 mg by mouth 2 (two) times daily as needed for anxiety.    [provider]  docusate sodium  (COLACE) 100 MG capsule Take 100 mg by mouth daily as needed for mild constipation.    [provider]  ELIQUIS  2.5 MG TABS tablet TAKE 1 TABLET BY MOUTH TWICE DAILY 04/20/23   Debera Jayson MATSU, MD  ferrous sulfate  325 (65 FE) MG  tablet Take 325 mg by mouth daily before lunch.    [provider]  loperamide  (IMODIUM ) 2 MG capsule Take 2 mg by mouth as needed for diarrhea or loose stools.    [provider]  meclizine  (ANTIVERT ) 25 MG tablet Take 25 mg by mouth as needed for dizziness.    [provider]  Multiple Vitamin (MULTIVITAMIN WITH MINERALS) TABS tablet Take 1 tablet by mouth daily. Centrum Silver for Women    [provider]  multivitamin-lutein  (OCUVITE-LUTEIN ) CAPS capsule Take 1 capsule by mouth daily before lunch.     [provider]  nitroGLYCERIN  (NITROSTAT ) 0.4 MG SL tablet PLACE 1 TAB UNDER TONGUE EVERY 3 MINUTES AS DIRECTED UP TO 3 TIMES AS NEEDED FOR CHEST PAIN. 01/07/21   Henry Manuelita NOVAK, NP  oxyCODONE -acetaminophen  (PERCOCET/ROXICET) 5-325 MG tablet Take 1 tablet by mouth every 6 (six) hours as needed for severe pain (pain score 7-10). 06/06/23   Zammit, Joseph, MD  polyethylene glycol powder (GLYCOLAX /MIRALAX ) 17 GM/SCOOP powder Take 17 g by mouth daily as needed for mild constipation. 07/02/23   Fairy Frames, MD  senna-docusate (SENOKOT-S) 8.6-50 MG tablet Take 1 tablet by mouth 2  (two) times daily. 07/02/23   Fairy Frames, MD  Simethicone  (GAS-X PO) Take 1 capsule by mouth daily as needed (gas).    [provider]  sodium chloride  (OCEAN) 0.65 % SOLN nasal spray Place 1 spray into both nostrils as needed for congestion.    [provider]  torsemide  (DEMADEX ) 20 MG tablet Take 1 tablet (20 mg total) by mouth daily. 07/02/23   Fairy Frames, MD    Physical Exam: Vitals:   02/01/24 1420 02/01/24 1423 02/01/24 1424 02/01/24 1630  BP: (!) 184/64   (!) 189/63  Pulse:   76 71  Resp: (!) 22 20 (!) 21 18  Temp:  98.7 F (37.1 C)    TempSrc:  Oral    SpO2:   93% 96%  Weight:   50 kg     Constitutional:  weak cough, calm, comfortable Vitals:   02/01/24 1420 02/01/24 1423 02/01/24 1424 02/01/24 1630  BP: (!) 184/64   (!) 189/63  Pulse:   76 71  Resp: (!) 22 20 (!) 21 18  Temp:  98.7 F (37.1 C)    TempSrc:  Oral    SpO2:   93% 96%  Weight:   50 kg    Eyes: PERRL, lids and conjunctivae normal ENMT: Mucous membranes are moist. .  Neck: normal, supple, no masses, no thyromegaly Respiratory: clear to auscultation bilaterally, no wheezing, no crackles. Normal respiratory effort. No accessory muscle use.  Cardiovascular: Regular rate and rhythm, no murmurs / rubs / gallops. No extremity edema.  Extremities warm. Abdomen: no tenderness, no masses palpated. No hepatosplenomegaly. Bowel sounds positive.  Musculoskeletal: no clubbing / cyanosis. No joint deformity upper and lower extremities.  Skin: Ecchymotic area to right temporal region, no rashes, lesions, ulcers. No induration Neurologic: No facial asymmetry, moving extremity spontaneously, speech fluent.  Psychiatric: Normal judgment and insight. Alert and oriented x 3. Normal mood.   Labs on Admission: I have personally reviewed following labs and imaging studies  CBC: Recent Labs  Lab 02/01/24 1436  WBC 22.5*  NEUTROABS 20.4*  HGB 12.9  HCT 38.7  MCV 80.6  PLT 509*   Basic  Metabolic Panel: Recent Labs  Lab 02/01/24 1436  NA 136  K 3.3*  CL 97*  CO2 20*  GLUCOSE 188*  BUN 56*  CREATININE 2.39*  CALCIUM  9.5   GFR: CrCl cannot be calculated (Unknown ideal weight.). Liver Function Tests: Recent Labs  Lab 02/01/24 1436  AST 20  ALT 16  ALKPHOS 142*  BILITOT 0.5  PROT 7.6  ALBUMIN  3.6   Coagulation Profile: Recent Labs  Lab 02/01/24 1436  INR 1.0   Urine analysis:    Component Value Date/Time   COLORURINE YELLOW 02/01/2024 1642   APPEARANCEUR HAZY (A) 02/01/2024 1642   LABSPEC 1.011 02/01/2024 1642   PHURINE 5.0 02/01/2024 1642   GLUCOSEU NEGATIVE 02/01/2024 1642   HGBUR NEGATIVE 02/01/2024 1642   BILIRUBINUR NEGATIVE 02/01/2024 1642   KETONESUR NEGATIVE 02/01/2024 1642   PROTEINUR 100 (A) 02/01/2024 1642   NITRITE NEGATIVE 02/01/2024 1642   LEUKOCYTESUR LARGE (A) 02/01/2024 1642    Radiological Exams on Admission: DG Chest Port 1 View Result Date: 02/01/2024 CLINICAL DATA:  Weakness cough EXAM: PORTABLE CHEST 1 VIEW COMPARISON:  06/29/2023, 06/27/2023, 09/28/2021 FINDINGS: Cardiomegaly with central vascular congestion. No focal consolidation, pleural effusion or pneumothorax. Aortic atherosclerosis. Apical pleuroparenchymal scarring. Clips in the right axilla. Bandlike atelectasis or scarring in the right infrahilar lung. Bronchitic changes. IMPRESSION: Cardiomegaly with central vascular congestion. Bronchitic changes in the lower lungs with scarring or atelectasis in the right infrahilar lung. Electronically Signed   By: Luke Bun M.D.   On: 02/01/2024 15:55    EKG: Independently reviewed.  Sinus rhythm, rate 68, QTc 488.  No significant change from prior.  Assessment/Plan Principal Problem:   Acute respiratory failure with hypoxemia (HCC) Active Problems:   Chronic combined systolic and diastolic CHF (congestive heart failure) (HCC)   Influenza A   Essential hypertension   AF (paroxysmal atrial fibrillation) (HCC)   CKD  (chronic kidney disease), stage IV (HCC)   Type 2 diabetes mellitus (HCC)   Assessment and Plan:  Acute hypoxic respiratory failure, influenza A infection-O2 sats 84% on room air.  Currently on 2 L sats greater than 93%.  Presenting with dyspnea, productive cough, leukocytosis of 22.5 suggestive of infectious etiology.  Influenza positive.  Normal lactic acid 1.5. Chest x-ray shows central vascular congestion, lower lung bronchitic changes, scarring or atelectasis right infrahilar lung. - With significant leukocytosis will obtain CT chest without contrast - May need antibiotics pending imaging results - DuoNebs, mucolytics - proBNP - Follow-up blood cultures - Flu positive  Chronic combined systolic and diastolic CHF-no peripheral signs of volume overload, chest x-ray with central vascular congestion.  Last echo 06/2023 EF <30%,G2DD. - Resume torsemide  20 mg daily, she reports compliance  Hypertension-elevated systolic 180s. -Resume Norvasc  5mg  daily - As needed labetalol  10mg  for systolic greater than 180  CKD 4-creatinine 2.39.  Stable.  Paroxysmal atrial fibrillation-EKG currently shows sinus rhythm.  Anticoagulation with Eliquis  was discontinued due to multiple falls. -Resume amiodarone   Diabetes mellitus- diet controlled.  A1c 06/09/2023- 5.3 - Monitor CBG daily  Multiple falls-last fall week and a half ago.  Ambulates with a walker.  Lives with her niece. -PT eval  DVT prophylaxis: Heparin   code Status: DNR confirmed with patient at bedside.  Universal DNR form on file. Family Communication: None at bedside Disposition Plan: ~ 2 Days Consults called: None Admission status: Inpt Tele I certify that at the point of admission it is my clinical judgment that the patient will require inpatient hospital care spanning beyond 2 midnights from the point of admission due to high intensity of service, high risk for further deterioration and high frequency of surveillance required.  Author: Tully FORBES Carwin, MD 02/01/2024 7:53 PM  For on call review [email or web address].      [1]  Allergies Allergen Reactions   Bactrim [Sulfamethoxazole-Trimethoprim] Nausea And Vomiting   Sulfa Antibiotics Nausea And Vomiting   Clonidine And Derivatives Other (See Comments)    FATIGUE   Evista [Raloxifene Hcl] Other (See Comments)    GERD   Fosamax [Alendronate Sodium] Other (See Comments)    INCREASED GERD    Glipizide Other (See Comments)    PALPITATIONS   Lipitor [Atorvastatin Calcium ] Other (See Comments)    ACHING   Losartan  Other (See Comments)    FATIGUE    Pravastatin Other (See Comments)    ACHING   Azithromycin Rash and Other (See Comments)    FACIAL BURNING    "

## 2024-02-01 NOTE — ED Provider Notes (Signed)
 " [place] EM[facility]GENC[facility] DEPARTMENT AT [facility] Provider Note   CSN: [id number] Arrival date & time: 02/01/24  1410     Patient presents with: Weakness   Crystal [name] is a 89 y.o. female.   HPI Patient presents from home via EMS with concern for cough, congestion, weakness. Since onset no relief with antibiotics, antitussive medication.  She has had worsening generalized weakness without focality.  She also has a history of recent increased frequency of falls, stopped her blood thinning medication end of last year due to this. Otherwise no new medication. EMS reports patient was hypoxic on room air, which is unusual for her, reportedly 84%, corrected to 93% with 2 L via nasal cannula.    Prior to Admission medications  Medication Sig Start Date End Date Taking? Authorizing Provider  acetaminophen  (TYLENOL ) 500 MG tablet Take 500 mg by mouth every 6 (six) hours as needed for headache (pain).    [provider]  amiodarone  (PACERONE ) 200 MG tablet Take 0.5 tablets (100 mg total) by mouth daily. 10/07/22   Debera Jayson MATSU, MD  CALCIUM  CARB-CHOLECALCIFEROL  PO Take 1 tablet by mouth daily.    [provider]  cholecalciferol  (VITAMIN D3) 25 MCG (1000 UT) tablet Take 1,000 Units by mouth daily with breakfast.    [provider]  diazepam  (VALIUM ) 2 MG tablet Take 2 mg by mouth 2 (two) times daily as needed for anxiety.    [provider]  docusate sodium  (COLACE) 100 MG capsule Take 100 mg by mouth daily as needed for mild constipation.    [provider]  ELIQUIS  2.5 MG TABS tablet TAKE 1 TABLET BY MOUTH TWICE DAILY 04/20/23   Debera Jayson MATSU, MD  ferrous sulfate  325 (65 FE) MG tablet Take 325 mg by mouth daily before lunch.    [provider]  loperamide  (IMODIUM ) 2 MG capsule Take 2 mg by mouth as needed for diarrhea or loose stools.    [provider]  meclizine  (ANTIVERT ) 25 MG tablet Take 25 mg by mouth  as needed for dizziness.    [provider]  Multiple Vitamin (MULTIVITAMIN WITH MINERALS) TABS tablet Take 1 tablet by mouth daily. Centrum Silver for Women    [provider]  multivitamin-lutein  (OCUVITE-LUTEIN ) CAPS capsule Take 1 capsule by mouth daily before lunch.     [provider]  nitroGLYCERIN  (NITROSTAT ) 0.4 MG SL tablet PLACE 1 TAB UNDER TONGUE EVERY 3 MINUTES AS DIRECTED UP TO 3 TIMES AS NEEDED FOR CHEST PAIN. 01/07/21   Henry Manuelita NOVAK, NP  oxyCODONE -acetaminophen  (PERCOCET/ROXICET) 5-325 MG tablet Take 1 tablet by mouth every 6 (six) hours as needed for severe pain (pain score 7-10). 06/06/23   Zammit, Joseph, MD  polyethylene glycol powder (GLYCOLAX /MIRALAX ) 17 GM/SCOOP powder Take 17 g by mouth daily as needed for mild constipation. 07/02/23   Fairy Frames, MD  senna-docusate (SENOKOT-S) 8.6-50 MG tablet Take 1 tablet by mouth 2 (two) times daily. 07/02/23   Fairy Frames, MD  Simethicone  (GAS-X PO) Take 1 capsule by mouth daily as needed (gas).    [provider]  sodium chloride  (OCEAN) 0.65 % SOLN nasal spray Place 1 spray into both nostrils as needed for congestion.    [provider]  torsemide  (DEMADEX ) 20 MG tablet Take 1 tablet (20 mg total) by mouth daily. 07/02/23   Fairy Frames, MD    Allergies: Bactrim [sulfamethoxazole-trimethoprim], Sulfa antibiotics, Clonidine and derivatives, Evista [raloxifene hcl], Fosamax [alendronate sodium], Glipizide,  Lipitor [atorvastatin calcium ], Losartan , Pravastatin, and Azithromycin    Review of Systems  Updated Vital Signs BP (!) 189/63   Pulse 71   Temp 98.7 F (37.1 C) (Oral)   Resp 18   Wt 50 kg   SpO2 96%   BMI 18.34 kg/m   Physical Exam Vitals and nursing note reviewed.  Constitutional:      General: She is not in acute distress.    Appearance: She is well-developed. She is ill-appearing. She is not toxic-appearing.  HENT:     Head: Normocephalic.   Eyes:      Conjunctiva/sclera: Conjunctivae normal.  Cardiovascular:     Rate and Rhythm: Regular rhythm.  Pulmonary:     Effort: Tachypnea present.     Breath sounds: Decreased air movement present. No stridor.  Abdominal:     General: There is no distension.  Skin:    General: Skin is warm and dry.  Neurological:     Mental Status: She is alert and oriented to person, place, and time.     Cranial Nerves: No cranial nerve deficit.     Motor: Atrophy present.  Psychiatric:        Cognition and Memory: Cognition is not impaired. Memory is not impaired.     (all labs ordered are listed, but only abnormal results are displayed) Labs Reviewed  COMPREHENSIVE METABOLIC PANEL WITH GFR - Abnormal; Notable for the following components:      Result Value   Potassium 3.3 (*)    Chloride 97 (*)    CO2 20 (*)    Glucose, Bld 188 (*)    BUN 56 (*)    Creatinine, Ser 2.39 (*)    Alkaline Phosphatase 142 (*)    GFR, Estimated 19 (*)    Anion gap 18 (*)    All other components within normal limits  CBC WITH DIFFERENTIAL/PLATELET - Abnormal; Notable for the following components:   WBC 22.5 (*)    RDW 16.1 (*)    Platelets 509 (*)    Neutro Abs 20.4 (*)    Lymphs Abs 0.4 (*)    Monocytes Absolute 1.1 (*)    Abs Immature Granulocytes 0.45 (*)    All other components within normal limits  CULTURE, BLOOD (ROUTINE X 2)  CULTURE, BLOOD (ROUTINE X 2)  LACTIC ACID, PLASMA  PROTIME-INR  URINALYSIS, W/ REFLEX TO CULTURE (INFECTION SUSPECTED)    EKG: None  Radiology: DG Chest Port 1 View Result Date: 02/01/2024 CLINICAL DATA:  Weakness cough EXAM: PORTABLE CHEST 1 VIEW COMPARISON:  06/29/2023, 06/27/2023, 09/28/2021 FINDINGS: Cardiomegaly with central vascular congestion. No focal consolidation, pleural effusion or pneumothorax. Aortic atherosclerosis. Apical pleuroparenchymal scarring. Clips in the right axilla. Bandlike atelectasis or scarring in the right infrahilar lung. Bronchitic changes.  IMPRESSION: Cardiomegaly with central vascular congestion. Bronchitic changes in the lower lungs with scarring or atelectasis in the right infrahilar lung. Electronically Signed   By: Luke Bun M.D.   On: 02/01/2024 15:55     Procedures   Medications Ordered in the ED  cefTRIAXone  (ROCEPHIN ) 1 g in sodium chloride  0.9 % 100 mL IVPB (has no administration in time range)  doxycycline  (VIBRA -TABS) tablet 100 mg (has no administration in time range)                                    Medical Decision Making Elderly female presents with cough, congestion, hypoxia at  home.  Patient is awake, alert, mentating appropriately. Patient has mild tachypnea, with hypoxia, differential including pneumonia, bronchitis, viral syndrome/bacteremia, sepsis all considered. Labs cultures sent. Cardiac 75 sinus normal pulse ox 94% on 2 L nasal cannula abnormal  Amount and/or Complexity of Data Reviewed Independent Historian: EMS External Data Reviewed: notes. Labs: ordered. Decision-making details documented in ED Course. Radiology: ordered and independent interpretation performed. Decision-making details documented in ED Course. ECG/medicine tests: ordered and independent interpretation performed. Decision-making details documented in ED Course.  Risk Prescription drug management. Decision regarding hospitalization. Diagnosis or treatment significantly limited by social determinants of health.   5:15 PM Patient in similar condition.  X-ray, leukocytosis, hypoxia all concerning for pneumonia.  Patient started antibiotics, given new ox requirement will require admission for further monitoring, management. Patient is on Eliquis , has no chest pain, low suspicion for PE.  Patient's renal dysfunction is chronic, she does have mild anion gap which is new, consistent with concern for pneumonia with leukocytosis, but normal lactic acid level, no hypotension reassuring for low suspicion for sepsis.  Flu test  pending on admission.  Urinalysis pending on admission     Final diagnoses:  Hypoxia  Community acquired pneumonia of right lower lobe of lung    ED Discharge Orders     None          Garrick Charleston, MD 02/01/24 1718  "

## 2024-02-01 NOTE — ED Triage Notes (Signed)
 Pt BIB [facility] for weakness for 2 weeks. Pt complaining of cough, weakness, N/V. Pt has been having repeated falls. Denies thinners. No LOC. Pt has bruises to both sides of face.

## 2024-02-02 DIAGNOSIS — E43 Unspecified severe protein-calorie malnutrition: Secondary | ICD-10-CM | POA: Diagnosis not present

## 2024-02-02 DIAGNOSIS — I480 Paroxysmal atrial fibrillation: Secondary | ICD-10-CM | POA: Diagnosis not present

## 2024-02-02 DIAGNOSIS — I5042 Chronic combined systolic (congestive) and diastolic (congestive) heart failure: Secondary | ICD-10-CM | POA: Diagnosis not present

## 2024-02-02 DIAGNOSIS — J9601 Acute respiratory failure with hypoxia: Secondary | ICD-10-CM | POA: Diagnosis not present

## 2024-02-02 DIAGNOSIS — I10 Essential (primary) hypertension: Secondary | ICD-10-CM | POA: Diagnosis not present

## 2024-02-02 DIAGNOSIS — N184 Chronic kidney disease, stage 4 (severe): Secondary | ICD-10-CM | POA: Diagnosis not present

## 2024-02-02 DIAGNOSIS — E1122 Type 2 diabetes mellitus with diabetic chronic kidney disease: Secondary | ICD-10-CM | POA: Diagnosis not present

## 2024-02-02 DIAGNOSIS — J101 Influenza due to other identified influenza virus with other respiratory manifestations: Secondary | ICD-10-CM | POA: Diagnosis not present

## 2024-02-02 LAB — CBC
HCT: 31.1 % — ABNORMAL LOW (ref 36.0–46.0)
Hemoglobin: 10.2 g/dL — ABNORMAL LOW (ref 12.0–15.0)
MCH: 27 pg (ref 26.0–34.0)
MCHC: 32.8 g/dL (ref 30.0–36.0)
MCV: 82.3 fL (ref 80.0–100.0)
Platelets: 456 K/uL — ABNORMAL HIGH (ref 150–400)
RBC: 3.78 MIL/uL — ABNORMAL LOW (ref 3.87–5.11)
RDW: 16.1 % — ABNORMAL HIGH (ref 11.5–15.5)
WBC: 17.1 K/uL — ABNORMAL HIGH (ref 4.0–10.5)
nRBC: 0 % (ref 0.0–0.2)

## 2024-02-02 LAB — BASIC METABOLIC PANEL WITH GFR
Anion gap: 15 (ref 5–15)
BUN: 52 mg/dL — ABNORMAL HIGH (ref 8–23)
CO2: 22 mmol/L (ref 22–32)
Calcium: 8.9 mg/dL (ref 8.9–10.3)
Chloride: 102 mmol/L (ref 98–111)
Creatinine, Ser: 2.13 mg/dL — ABNORMAL HIGH (ref 0.44–1.00)
GFR, Estimated: 22 mL/min — ABNORMAL LOW
Glucose, Bld: 82 mg/dL (ref 70–99)
Potassium: 2.9 mmol/L — ABNORMAL LOW (ref 3.5–5.1)
Sodium: 139 mmol/L (ref 135–145)

## 2024-02-02 MED ORDER — BOOST / RESOURCE BREEZE PO LIQD CUSTOM
1.0000 | Freq: Three times a day (TID) | ORAL | Status: DC
  Administered 2024-02-03 – 2024-02-06 (×7): 1 via ORAL

## 2024-02-02 MED ORDER — DM-GUAIFENESIN ER 30-600 MG PO TB12
1.0000 | ORAL_TABLET | Freq: Two times a day (BID) | ORAL | Status: DC
  Administered 2024-02-02 – 2024-02-06 (×9): 1 via ORAL
  Filled 2024-02-02 (×9): qty 1

## 2024-02-02 MED ORDER — ADULT MULTIVITAMIN W/MINERALS CH
1.0000 | ORAL_TABLET | Freq: Every day | ORAL | Status: DC
  Administered 2024-02-02 – 2024-02-06 (×5): 1 via ORAL
  Filled 2024-02-02 (×5): qty 1

## 2024-02-02 MED ORDER — PREDNISONE 20 MG PO TABS
40.0000 mg | ORAL_TABLET | Freq: Every day | ORAL | Status: DC
  Administered 2024-02-02 – 2024-02-04 (×3): 40 mg via ORAL
  Filled 2024-02-02 (×3): qty 2

## 2024-02-02 MED ORDER — SODIUM CHLORIDE 0.9 % IV SOLN
2.0000 g | INTRAVENOUS | Status: DC
  Administered 2024-02-02 – 2024-02-05 (×4): 2 g via INTRAVENOUS
  Filled 2024-02-02 (×3): qty 20

## 2024-02-02 MED ORDER — SODIUM CHLORIDE 0.9 % IV SOLN
100.0000 mg | Freq: Two times a day (BID) | INTRAVENOUS | Status: DC
  Administered 2024-02-02 – 2024-02-06 (×9): 100 mg via INTRAVENOUS
  Filled 2024-02-02 (×12): qty 100

## 2024-02-02 MED ORDER — PANTOPRAZOLE SODIUM 40 MG PO TBEC
40.0000 mg | DELAYED_RELEASE_TABLET | Freq: Every day | ORAL | Status: DC
  Administered 2024-02-02 – 2024-02-06 (×5): 40 mg via ORAL
  Filled 2024-02-02 (×5): qty 1

## 2024-02-02 MED ORDER — POTASSIUM CHLORIDE CRYS ER 20 MEQ PO TBCR
40.0000 meq | EXTENDED_RELEASE_TABLET | Freq: Once | ORAL | Status: AC
  Administered 2024-02-02: 40 meq via ORAL
  Filled 2024-02-02: qty 2

## 2024-02-02 MED ORDER — BUDESONIDE 0.5 MG/2ML IN SUSP
0.5000 mg | Freq: Two times a day (BID) | RESPIRATORY_TRACT | Status: DC
  Administered 2024-02-02 – 2024-02-06 (×9): 0.5 mg via RESPIRATORY_TRACT
  Filled 2024-02-02 (×9): qty 2

## 2024-02-02 MED ORDER — ENSURE PLUS HIGH PROTEIN PO LIQD
237.0000 mL | Freq: Two times a day (BID) | ORAL | Status: DC
  Administered 2024-02-02: 237 mL via ORAL

## 2024-02-02 NOTE — Progress Notes (Addendum)
 Initial Nutrition Assessment  DOCUMENTATION CODES:  Severe malnutrition in context of acute illness/injury, Underweight  INTERVENTION:  Boost Breeze po TID, each supplement provides 250 kcal and 9 grams of protein Magic cup TID with meals, each supplement provides 290 kcal and 9 grams of protein MVI with minerals daily Liberalize diet to regular  NUTRITION DIAGNOSIS:  Severe Malnutrition related to acute illness (influenza) as evidenced by moderate muscle depletion, moderate fat depletion, energy intake < or equal to 50% for > or equal to 5 days.  GOAL:  Patient will meet greater than or equal to 90% of their needs  MONITOR:  PO intake, Supplement acceptance  REASON FOR ASSESSMENT:  Rounds, Malnutrition Screening Tool    ASSESSMENT:  89 yo female admitted with acute respiratory failure, influenza A. PMH includes CHF, CKD 5, HTN, DM-2, CAD, gout, Bell's palsy, HLD, osteopenia.  Patient reports poor intake for the past few weeks d/t feeling bad and having no appetite. She has been eating less than half of her usual intake at home x 1-2 weeks. She has lost weight, but unable to quantify. She likes [employer], drank 1 this morning, but does not like Ensure supplements. She agreed to try magic cups with meals. For lunch today, she ate half of the baked sweet potato and drank half of her tea, but nothing else.  Usual weight: 55.7 kg (07/02/23) Current weight: 50 kg (02/02/23) 10% weight loss x 7 months is not clinically significant. Weight fluctuations could be r/t fluids with hx of CHF.  Nutritionally Relevant Medications: Scheduled Meds:  budesonide  (PULMICORT ) nebulizer solution  0.5 mg Nebulization BID   dextromethorphan -guaiFENesin   1 tablet Oral BID   feeding supplement  1 Container Oral TID BM   heparin   5,000 Units Subcutaneous Q8H   multivitamin with minerals  1 tablet Oral Daily   pantoprazole   40 mg Oral Daily   predniSONE   40 mg Oral Q breakfast   torsemide   20 mg Oral  Daily   Continuous Infusions:  cefTRIAXone  (ROCEPHIN )  IV     doxycycline  (VIBRAMYCIN ) IV 100 mg (02/02/24 0606)   promethazine (PHENERGAN) injection (IM or IVPB)     PRN Meds:.acetaminophen  **OR** acetaminophen , albuterol , labetalol , polyethylene glycol, promethazine (PHENERGAN) injection (IM or IVPB)  Labs Reviewed: K 3.3 --> 2.9  NUTRITION - FOCUSED PHYSICAL EXAM: Flowsheet Row Most Recent Value  Orbital Region Moderate depletion  Upper Arm Region Moderate depletion  Thoracic and Lumbar Region Moderate depletion  Buccal Region Moderate depletion  Temple Region Moderate depletion  Clavicle Bone Region Moderate depletion  Clavicle and Acromion Bone Region Mild depletion  Scapular Bone Region Mild depletion  Dorsal Hand Moderate depletion  Patellar Region Moderate depletion  Anterior Thigh Region Moderate depletion  Posterior Calf Region Severe depletion  Edema (RD Assessment) None  Hair Reviewed  Eyes Reviewed  Mouth Reviewed  Skin Reviewed  Nails Reviewed    Diet Order:   Diet Order             Diet regular Room service appropriate? Yes; Fluid consistency: Thin  Diet effective now                   EDUCATION NEEDS:  Education needs have been addressed  Skin:  Skin Assessment: Skin Integrity Issues: Skin Integrity Issues:: Stage II Stage II: coccyx  Last BM:  1/4  Height:  Ht Readings from Last 1 Encounters:  02/02/24 5' 5 (1.651 m)   Weight:  Wt Readings from Last 1 Encounters:  02/02/24 50 kg   Ideal Body Weight:  56.8 kg  BMI:  Body mass index is 18.34 kg/m.  Estimated Nutritional Needs:  Kcal:  1350-1550 Protein:  65-75 gm Fluid:  1.5 L   Suzen HUNT RD, LDN, CNSC Contact via secure chat. If unavailable, use group chat RD Inpatient.

## 2024-02-02 NOTE — Assessment & Plan Note (Signed)
-   Continue current antihypertensive agents.

## 2024-02-02 NOTE — Assessment & Plan Note (Signed)
-   Appears to be stable - Minimize nephrotoxic agents - Follow renal function trend.

## 2024-02-02 NOTE — Plan of Care (Signed)

## 2024-02-02 NOTE — Progress Notes (Signed)
 " PROGRESS NOTE    [name]  FMW:[id number] DOB: [birth year] DOA: 02/01/2024  PCP: Rosamond Leta NOVAK, MD    Chief Complaint  Patient presents with   Weakness    Brief admission narrative:  As per H&P written by Dr. Pearlean on 02/01/2024 Crystal [name] Oddi is a 89 y.o. female with medical history significant for systolic and diastolic CHF, CKD 5, hypertension, diabetes mellitus, coronary artery disease, gout. Patient presented to the ED with complaints of generalized weakness of 2 weeks duration, with cough productive of brownish sputum, nausea and vomiting.  She reports she has not been able to keep anything down.  No abdominal pain no chest pain.  No lower extremity swelling or abdominal bloating.  She has lost weight over the past 2 weeks because she has been ill and not eating, she does not check her weight regularly.  She reports her weight is down to 101.  Has been compliant with torsemide .  She is no longer on Eliquis . She reports she last fell about a week and a half ago.  In December she fell about 6 times.  She lives with her niece.  Ambulates with a walker at baseline.   ED Course: Temperature 98.7.  Heart rate 71-76.  Respirate rate 18-22.  Blood pressure systolic 180s.  O2 sats down to 84% on room air, placed on 2 L sats 93%. WBC 22.5. COVID influenza RSV pending Lactic acid 1.5. Chest x-ray shows cardiomegaly, central vascular congestion, lower lobes with scarring or atelectasis in the right infrahilar lung.  IV ceftriaxone  and doxycycline  started. Hospitalist to admit for acute hypoxic respiratory failure, possibly pneumonia.  Assessment & Plan:   Assessment & Plan Acute respiratory failure with hypoxemia (HCC) - In the setting of influenza A with pneumonia -Continue IV antibiotics, mucolytics, bronchodilator management and steroids - Based on patient's symptom presentation out of window for the use of Tamiflu at this time - Continue supportive care and wean off  oxygen  supplementation as tolerated. Chronic combined systolic and diastolic CHF (congestive heart failure) (HCC) - No significant crackles appreciated on exam - Continue home diuretic regimen - Follow daily weights, low-sodium diet and strict intake and output. Influenza A - Present at time of admission as mentioned above; patient out of window for the use of Tamiflu - There is component of reactive airway disease and patient has been started on steroids along with antibiotics with concern for subsequent bacterial pneumonia - Continue the use of flutter valve, mucolytic's, bronchodilator management and supportive care. Essential hypertension - Continue current antihypertensive agents. AF (paroxysmal atrial fibrillation) (HCC) - No longer on anticoagulation - Continue telemetry monitoring - CKD (chronic kidney disease), stage IV (HCC) - Appears to be stable - Minimize nephrotoxic agents - Follow renal function trend. Type 2 diabetes mellitus (HCC) - Currently stable - Diet control as an outpatient - Follow CBG fluctuation. Protein-calorie malnutrition, severe -Body mass index is 18.34 kg/m. - Appreciate assistance and recommendation by nutritional service - Continue feeding supplements.  DVT prophylaxis: Heparin  Code Status: DNR/DNI. Family Communication: No family at bedside. Disposition:   Status is: Inpatient Remains inpatient appropriate because: Continue IV antibiotics.   Consultants:  None  Procedures:  See below for x-ray reports.  Antimicrobials:  Zithromax and ceftriaxone    Subjective: Afebrile; complaining of short winded sensation with minimal exertion, intermittent coughing spells and shortness of breath.  2 L nasal cannula supplementation in place to maintain saturation.  Objective: Vitals:   02/02/24 0802 02/02/24 1015 02/02/24  1208 02/02/24 1335  BP:  (!) 199/57 (!) 173/49 (!) 166/57  Pulse:   64 64  Resp:    20  Temp:  98.8 F (37.1 C) 97.9 F  (36.6 C) 99 F (37.2 C)  TempSrc:  Oral Oral Oral  SpO2: 91% 96% 98% 96%  Weight:      Height:        Intake/Output Summary (Last 24 hours) at 02/02/2024 1808 Last data filed at 02/02/2024 1527 Gross per 24 hour  Intake 845 ml  Output 500 ml  Net 345 ml   Filed Weights   02/01/24 1424 02/02/24 0442  Weight: 50 kg 50 kg    Examination:  General exam: Appears acutely ill and underweight in appearance; complaining of intermittent coughing spells and shortness of breath. Respiratory system: Positive expiratory wheezing and bilateral rhonchi appreciated on exam.  2 L nasal cannula supplementation in place.  No using accessory muscles (at rest). Cardiovascular system: Rate controlled, no rubs, no gallops, no JVD. Gastrointestinal system: Abdomen is nondistended, soft and nontender.  Positive bowel sounds appreciated on exam. Central nervous system: No focal neurological deficits. Extremities: Cyanosis or clubbing; trace edema appreciated bilaterally. Skin: No petechiae. Psychiatry: Judgement and insight appear normal.  Flat affect appreciated on exam.    Data Reviewed: I have personally reviewed following labs and imaging studies  CBC: Recent Labs  Lab 02/01/24 1436 02/02/24 0428  WBC 22.5* 17.1*  NEUTROABS 20.4*  --   HGB 12.9 10.2*  HCT 38.7 31.1*  MCV 80.6 82.3  PLT 509* 456*    Basic Metabolic Panel: Recent Labs  Lab 02/01/24 1436 02/02/24 0428  NA 136 139  K 3.3* 2.9*  CL 97* 102  CO2 20* 22  GLUCOSE 188* 82  BUN 56* 52*  CREATININE 2.39* 2.13*  CALCIUM  9.5 8.9    GFR: Estimated Creatinine Clearance: 14.7 mL/min (A) (by C-G formula based on SCr of 2.13 mg/dL (H)).  Liver Function Tests: Recent Labs  Lab 02/01/24 1436  AST 20  ALT 16  ALKPHOS 142*  BILITOT 0.5  PROT 7.6  ALBUMIN  3.6    CBG: No results for input(s): GLUCAP in the last 168 hours.   Recent Results (from the past 240 hours)  Blood Culture (routine x 2)     Status: None  (Preliminary result)   Collection Time: 02/01/24  2:47 PM   Specimen: BLOOD  Result Value Ref Range Status   Specimen Description BLOOD LEFT ARM  Final   Special Requests   Final    BOTTLES DRAWN AEROBIC AND ANAEROBIC Blood Culture results may not be optimal due to an inadequate volume of blood received in culture bottles   Culture   Final    NO GROWTH < 24 HOURS Performed at [facility], [address] [place], [place] [address]    Report Status PENDING  Incomplete  Blood Culture (routine x 2)     Status: None (Preliminary result)   Collection Time: 02/01/24  3:26 PM   Specimen: Right Antecubital; Blood  Result Value Ref Range Status   Specimen Description   Final    RIGHT ANTECUBITAL BOTTLES DRAWN AEROBIC AND ANAEROBIC   Special Requests   Final    Blood Culture results may not be optimal due to an inadequate volume of blood received in culture bottles   Culture   Final    NO GROWTH < 24 HOURS Performed at [facility], [address] [place], [place] [address]    Report  Status PENDING  Incomplete  Urine Culture     Status: Abnormal (Preliminary result)   Collection Time: 02/01/24  4:42 PM   Specimen: Urine, Random  Result Value Ref Range Status   Specimen Description   Final    URINE, RANDOM Performed at [facility], [address] [place], [place] [address]    Special Requests   Final    NONE Reflexed from [id number] Performed at [facility], [address] [place], [place] [address]    Culture (A)  Final    >=100,000 COLONIES/mL GRAM NEGATIVE RODS SUSCEPTIBILITIES TO FOLLOW Performed at [facility], [address] [place], [place] [address]    Report Status PENDING  Incomplete  Resp panel by RT-PCR (RSV, Flu A&B, Covid) Anterior Nasal Swab     Status: Abnormal   Collection Time: 02/01/24  5:18 PM   Specimen: Anterior Nasal Swab  Result Value Ref Range Status   SARS Coronavirus 2 by RT PCR NEGATIVE NEGATIVE Final    Comment: (NOTE) SARS-CoV-2 target nucleic  acids are NOT DETECTED.  The SARS-CoV-2 RNA is generally detectable in upper respiratory specimens during the acute phase of infection. The lowest concentration of SARS-CoV-2 viral copies this assay can detect is 138 copies/mL. A negative result does not preclude SARS-Cov-2 infection and should not be used as the sole basis for treatment or other patient management decisions. A negative result may occur with  improper specimen collection/handling, submission of specimen other than nasopharyngeal swab, presence of viral mutation(s) within the areas targeted by this assay, and inadequate number of viral copies(<138 copies/mL). A negative result must be combined with clinical observations, patient history, and epidemiological information. The expected result is Negative.  Fact Sheet for Patients:  bloggercourse.com  Fact Sheet for Healthcare Providers:  seriousbroker.it  This test is no t yet approved or cleared by the [place]  FDA and  has been authorized for detection and/or diagnosis of SARS-CoV-2 by FDA under an Emergency Use Authorization (EUA). This EUA will remain  in effect (meaning this test can be used) for the duration of the COVID-19 declaration under Section 564(b)(1) of the Act, 21 U.S.C.section 360bbb-3(b)(1), unless the authorization is terminated  or revoked sooner.       Influenza A by PCR POSITIVE (A) NEGATIVE Final   Influenza B by PCR NEGATIVE NEGATIVE Final    Comment: (NOTE) The Xpert Xpress SARS-CoV-2/FLU/RSV plus assay is intended as an aid in the diagnosis of influenza from Nasopharyngeal swab specimens and should not be used as a sole basis for treatment. Nasal washings and aspirates are unacceptable for Xpert Xpress SARS-CoV-2/FLU/RSV testing.  Fact Sheet for Patients: bloggercourse.com  Fact Sheet for Healthcare  Providers: seriousbroker.it  This test is not yet approved or cleared by the [place]  FDA and has been authorized for detection and/or diagnosis of SARS-CoV-2 by FDA under an Emergency Use Authorization (EUA). This EUA will remain in effect (meaning this test can be used) for the duration of the COVID-19 declaration under Section 564(b)(1) of the Act, 21 U.S.C. section 360bbb-3(b)(1), unless the authorization is terminated or revoked.     Resp Syncytial Virus by PCR NEGATIVE NEGATIVE Final    Comment: (NOTE) Fact Sheet for Patients: bloggercourse.com  Fact Sheet for Healthcare Providers: seriousbroker.it  This test is not yet approved or cleared by the [place]  FDA and has been authorized for detection and/or diagnosis of SARS-CoV-2 by FDA under an Emergency Use Authorization (EUA). This EUA will remain in effect (meaning  this test can be used) for the duration of the COVID-19 declaration under Section 564(b)(1) of the Act, 21 U.S.C. section 360bbb-3(b)(1), unless the authorization is terminated or revoked.  Performed at [facility], [address], [place], [place] [address]      Radiology Studies: CT CHEST WO CONTRAST Result Date: 02/01/2024 CLINICAL DATA:  Cough dyspnea leukocytosis EXAM: CT CHEST WITHOUT CONTRAST TECHNIQUE: Multidetector CT imaging of the chest was performed following the standard protocol without IV contrast. RADIATION DOSE REDUCTION: This exam was performed according to the departmental dose-optimization program which includes automated exposure control, adjustment of the mA and/or kV according to patient size and/or use of iterative reconstruction technique. COMPARISON:  Chest x-ray 02/01/2024, lumbar CT 06/06/2023 FINDINGS: Cardiovascular: Limited without intravenous contrast. Moderate aortic atherosclerosis. No aneurysm. Multi vessel coronary vascular calcification. Mild  aortic valve calcifications. Cardiomegaly. No sizable pericardial effusion Mediastinum/Nodes: Patent trachea. Multiple thyroid  nodules, on the right measuring up to 2.1 cm. Multiple mildly enlarged mediastinal nodes. Precarinal node measures 14 mm. Subcarinal lymph node measures 14 mm. Esophagus within normal limits. Lungs/Pleura: Partial bilateral lower lobe consolidations. Lower lobe bronchial wall thickening and peribronchovascular thickening. Widespread bilateral tree-in-bud density consistent with respiratory infection. No pleural effusion or pneumothorax Upper Abdomen: No acute finding Musculoskeletal: Degenerative changes. Severe compression fracture T12 with gas in the adjacent disc spaces. Progressive loss of height compared with CT from May. About 5 mm retropulsion of the upper vertebral body results in mass effect on the ventral thecal sac. IMPRESSION: 1. Widespread bilateral tree-in-bud density and partial lower lobe consolidations consistent with respiratory infection/pneumonia. Imaging follow-up to resolution recommended. 2. Mildly enlarged mediastinal nodes, likely reactive. 3. Cardiomegaly. 4. Multiple thyroid  nodules measuring up to 2.1 cm. This has been evaluated on previous imaging. (ref: J Am Coll Radiol. 2015 Feb;12(2): 143-50). 5. Severe compression fracture T12 is chronic but significant progression in loss of vertebral body height compared with CT from May last year Aortic Atherosclerosis (ICD10-I70.0). Electronically Signed   By: Luke Bun M.D.   On: 02/01/2024 20:15   DG Chest Port 1 View Result Date: 02/01/2024 CLINICAL DATA:  Weakness cough EXAM: PORTABLE CHEST 1 VIEW COMPARISON:  06/29/2023, 06/27/2023, 09/28/2021 FINDINGS: Cardiomegaly with central vascular congestion. No focal consolidation, pleural effusion or pneumothorax. Aortic atherosclerosis. Apical pleuroparenchymal scarring. Clips in the right axilla. Bandlike atelectasis or scarring in the right infrahilar lung.  Bronchitic changes. IMPRESSION: Cardiomegaly with central vascular congestion. Bronchitic changes in the lower lungs with scarring or atelectasis in the right infrahilar lung. Electronically Signed   By: Luke Bun M.D.   On: 02/01/2024 15:55    Scheduled Meds:  budesonide  (PULMICORT ) nebulizer solution  0.5 mg Nebulization BID   dextromethorphan -guaiFENesin   1 tablet Oral BID   feeding supplement  1 Container Oral TID BM   heparin   5,000 Units Subcutaneous Q8H   multivitamin with minerals  1 tablet Oral Daily   pantoprazole   40 mg Oral Daily   predniSONE   40 mg Oral Q breakfast   torsemide   20 mg Oral Daily   Continuous Infusions:  cefTRIAXone  (ROCEPHIN )  IV 2 g (02/02/24 1724)   doxycycline  (VIBRAMYCIN ) IV 100 mg (02/02/24 0606)   promethazine (PHENERGAN) injection (IM or IVPB)       LOS: 1 day    Time spent: 50 minutes    Eric Nunnery, MD [facility]   To contact the attending provider between 7A-7P or the covering provider during after hours 7P-7A, please log into the web site [email or web address] and  access using universal [place] password for that web site. If you do not have the password, please call the hospital operator.  02/02/2024, 6:08 PM    "

## 2024-02-02 NOTE — Assessment & Plan Note (Signed)
-   Currently stable - Diet control as an outpatient - Follow CBG fluctuation.

## 2024-02-02 NOTE — Assessment & Plan Note (Signed)
-  Body mass index is 18.34 kg/m. - Appreciate assistance and recommendation by nutritional service - Continue feeding supplements.

## 2024-02-02 NOTE — Progress Notes (Signed)
" °   02/02/24 1430  TOC Brief Assessment  Insurance and Status Reviewed  Patient has primary care physician Yes  Home environment has been reviewed Home with family  Prior level of function: needs assistance  Prior/Current Home Services No current home services  Social Drivers of Health Review SDOH reviewed no interventions necessary  Readmission risk has been reviewed Yes  Transition of care needs no transition of care needs at this time   Inpatient Care Manager (ICM) has reviewed patient and no ICM needs have been identified at this time. We will continue to monitor patient advancement through interdisciplinary progression rounds. If new patient transition needs arise, please place a ICM consult.   "

## 2024-02-02 NOTE — Plan of Care (Signed)
   Problem: Education: Goal: Knowledge of General Education information will improve Description Including pain rating scale, medication(s)/side effects and non-pharmacologic comfort measures Outcome: Progressing   Problem: Education: Goal: Knowledge of General Education information will improve Description Including pain rating scale, medication(s)/side effects and non-pharmacologic comfort measures Outcome: Progressing

## 2024-02-02 NOTE — Assessment & Plan Note (Signed)
-   In the setting of influenza A with pneumonia -Continue IV antibiotics, mucolytics, bronchodilator management and steroids - Based on patient's symptom presentation out of window for the use of Tamiflu at this time - Continue supportive care and wean off oxygen  supplementation as tolerated.

## 2024-02-02 NOTE — Assessment & Plan Note (Signed)
-   No longer on anticoagulation - Continue telemetry monitoring -

## 2024-02-02 NOTE — Progress Notes (Signed)
 Patient's potassium was 2.9 this am,Dr Eric Nunnery notified. Plan of care on going.

## 2024-02-02 NOTE — Plan of Care (Signed)
   Problem: Education: Goal: Knowledge of General Education information will improve Description: Including pain rating scale, medication(s)/side effects and non-pharmacologic comfort measures Outcome: Progressing   Problem: Clinical Measurements: Goal: Ability to maintain clinical measurements within normal limits will improve Outcome: Progressing Goal: Diagnostic test results will improve Outcome: Progressing

## 2024-02-02 NOTE — Assessment & Plan Note (Signed)
-   No significant crackles appreciated on exam - Continue home diuretic regimen - Follow daily weights, low-sodium diet and strict intake and output.

## 2024-02-02 NOTE — Assessment & Plan Note (Signed)
-   Present at time of admission as mentioned above; patient out of window for the use of Tamiflu - There is component of reactive airway disease and patient has been started on steroids along with antibiotics with concern for subsequent bacterial pneumonia - Continue the use of flutter valve, mucolytic's, bronchodilator management and supportive care.

## 2024-02-02 NOTE — Progress Notes (Addendum)
 Nurse at bedside,patient alert and oriented to person,place and situation,a little confused with the date,patient knows what month it is and year.Blood pressure 199/57,heart rate 69,Dr Eric Nunnery.Plan of care on going.

## 2024-02-03 DIAGNOSIS — J9601 Acute respiratory failure with hypoxia: Secondary | ICD-10-CM | POA: Diagnosis not present

## 2024-02-03 LAB — BASIC METABOLIC PANEL WITH GFR
Anion gap: 9 (ref 5–15)
BUN: 46 mg/dL — ABNORMAL HIGH (ref 8–23)
CO2: 26 mmol/L (ref 22–32)
Calcium: 8.7 mg/dL — ABNORMAL LOW (ref 8.9–10.3)
Chloride: 104 mmol/L (ref 98–111)
Creatinine, Ser: 1.98 mg/dL — ABNORMAL HIGH (ref 0.44–1.00)
GFR, Estimated: 24 mL/min — ABNORMAL LOW
Glucose, Bld: 116 mg/dL — ABNORMAL HIGH (ref 70–99)
Potassium: 3.8 mmol/L (ref 3.5–5.1)
Sodium: 139 mmol/L (ref 135–145)

## 2024-02-03 LAB — URINE CULTURE: Culture: >=100000 — AB

## 2024-02-03 LAB — CBC
HCT: 30.1 % — ABNORMAL LOW (ref 36.0–46.0)
Hemoglobin: 9.8 g/dL — ABNORMAL LOW (ref 12.0–15.0)
MCH: 26.9 pg (ref 26.0–34.0)
MCHC: 32.6 g/dL (ref 30.0–36.0)
MCV: 82.7 fL (ref 80.0–100.0)
Platelets: 425 K/uL — ABNORMAL HIGH (ref 150–400)
RBC: 3.64 MIL/uL — ABNORMAL LOW (ref 3.87–5.11)
RDW: 15.9 % — ABNORMAL HIGH (ref 11.5–15.5)
WBC: 13.9 K/uL — ABNORMAL HIGH (ref 4.0–10.5)
nRBC: 0 % (ref 0.0–0.2)

## 2024-02-03 LAB — MAGNESIUM: Magnesium: 2.3 mg/dL (ref 1.7–2.4)

## 2024-02-03 MED ORDER — AMLODIPINE BESYLATE 5 MG PO TABS
5.0000 mg | ORAL_TABLET | Freq: Every day | ORAL | Status: DC
  Administered 2024-02-03: 5 mg via ORAL
  Filled 2024-02-03: qty 1

## 2024-02-03 MED ORDER — AMLODIPINE BESYLATE 5 MG PO TABS
5.0000 mg | ORAL_TABLET | Freq: Two times a day (BID) | ORAL | Status: DC
  Administered 2024-02-03 – 2024-02-06 (×6): 5 mg via ORAL
  Filled 2024-02-03 (×6): qty 1

## 2024-02-03 MED ORDER — AMIODARONE HCL 200 MG PO TABS
100.0000 mg | ORAL_TABLET | Freq: Every day | ORAL | Status: DC
  Administered 2024-02-03 – 2024-02-06 (×4): 100 mg via ORAL
  Filled 2024-02-03 (×4): qty 1

## 2024-02-03 NOTE — Assessment & Plan Note (Signed)
-   No significant crackles appreciated on exam - Continue home diuretic regimen - Follow daily weights, low-sodium diet and strict intake and output.

## 2024-02-03 NOTE — Assessment & Plan Note (Signed)
-  Body mass index is 18.34 kg/m. - Appreciate assistance and recommendation by nutritional service - Continue feeding supplements.

## 2024-02-03 NOTE — Assessment & Plan Note (Signed)
-   Continue current antihypertensive agents.

## 2024-02-03 NOTE — Assessment & Plan Note (Signed)
-   Appears to be stable - Minimize nephrotoxic agents - Follow renal function trend.

## 2024-02-03 NOTE — Assessment & Plan Note (Signed)
-   No longer on anticoagulation - Continue telemetry monitoring - Patient's family says still takes amiodarone , will continue

## 2024-02-03 NOTE — Progress Notes (Signed)
 " PROGRESS NOTE    Crystal [name]  FMW:[id number] DOB: 11-09-1934 DOA: 02/01/2024  PCP: Rosamond Leta NOVAK, MD    Chief Complaint  Patient presents with   Weakness    Brief admission narrative:  As per H&P written by Dr. Pearlean on 02/01/2024 Crystal [name] is a 89 y.o. female with medical history significant for systolic and diastolic CHF, CKD 5, hypertension, diabetes mellitus, coronary artery disease, gout. Patient presented to the ED with complaints of generalized weakness of 2 weeks duration, with cough productive of brownish sputum, nausea and vomiting.  She reports she has not been able to keep anything down.  No abdominal pain no chest pain.  No lower extremity swelling or abdominal bloating.  She has lost weight over the past 2 weeks because she has been ill and not eating, she does not check her weight regularly.  She reports her weight is down to 101.  Has been compliant with torsemide .  She is no longer on Eliquis . She reports she last fell about a week and a half ago.  In December she fell about 6 times.  She lives with her niece.  Ambulates with a walker at baseline.  She was graduated from hospice care recently.  ED Course: Temperature 98.7.  Heart rate 71-76.  Respirate rate 18-22.  Blood pressure systolic 180s.  O2 sats down to 84% on room air, placed on 2 L sats 93%. WBC 22.5. COVID influenza RSV pending Lactic acid 1.5. Chest x-ray shows cardiomegaly, central vascular congestion, lower lobes with scarring or atelectasis in the right infrahilar lung.  IV ceftriaxone  and doxycycline  started. Hospitalist to admit for acute hypoxic respiratory failure, possibly pneumonia.  Assessment & Plan:   Assessment & Plan Acute respiratory failure with hypoxemia (HCC) - In the setting of influenza A with pneumonia -CT shows diffuse tree-in-bud appearance consistent with pneumonia. -Continue IV antibiotics, mucolytics, bronchodilator management and steroids - Based on patient's  symptom presentation out of window for the use of Tamiflu at this time - Continue supportive care and wean off oxygen  supplementation as tolerated. Chronic combined systolic and diastolic CHF (congestive heart failure) (HCC) - No significant crackles appreciated on exam - Continue home diuretic regimen - Follow daily weights, low-sodium diet and strict intake and output. Influenza A - Present at time of admission as mentioned above; patient out of window for the use of Tamiflu - There is component of reactive airway disease and patient has been started on steroids along with antibiotics with concern for subsequent bacterial pneumonia - Continue the use of flutter valve, mucolytic's, bronchodilator management and supportive care.  Urinary tract infection: Urine growing E. coli, continue ceftriaxone , follow susceptibilities Essential hypertension - Continue current antihypertensive agents. AF (paroxysmal atrial fibrillation) (HCC) - No longer on anticoagulation - Continue telemetry monitoring - Patient's family says still takes amiodarone , will continue CKD (chronic kidney disease), stage IV (HCC) - Appears to be stable - Minimize nephrotoxic agents - Follow renal function trend. Type 2 diabetes mellitus (HCC) - Currently stable - Diet control as an outpatient - Follow CBG fluctuation. Protein-calorie malnutrition, severe -Body mass index is 18.34 kg/m. - Appreciate assistance and recommendation by nutritional service - Continue feeding supplements.  DVT prophylaxis: Heparin  Code Status: DNR/DNI. Family Communication: Spoke with niece over the phone  disposition:   Status is: Inpatient Remains inpatient appropriate because: Continue IV antibiotics.   Consultants:  None  Procedures:  See below for x-ray reports.  Antimicrobials:  Zithromax and ceftriaxone   Subjective: Afebrile; says he feels better.  Less coughing.  On 2 L/min oxygen . Objective: Vitals:   02/03/24  0755 02/03/24 0904 02/03/24 1040 02/03/24 1417  BP:  (!) 199/70 (!) 182/82 (!) 173/61  Pulse:  78 68 68  Resp:      Temp:  98.5 F (36.9 C)  99.1 F (37.3 C)  TempSrc:  Oral  Oral  SpO2: 96% 90%  96%  Weight:      Height:        Intake/Output Summary (Last 24 hours) at 02/03/2024 1639 Last data filed at 02/03/2024 1417 Gross per 24 hour  Intake 220 ml  Output 500 ml  Net -280 ml   Filed Weights   02/01/24 1424 02/02/24 0442  Weight: 50 kg 50 kg    Examination:  General exam: Appears acutely ill and underweight in appearance; complaining of intermittent coughing spells and shortness of breath. Respiratory system: Positive expiratory wheezing and bilateral rhonchi appreciated on exam.  2 L nasal cannula supplementation in place.  No using accessory muscles (at rest). Cardiovascular system: Rate controlled, no rubs, no gallops, no JVD. Gastrointestinal system: Abdomen is nondistended, soft and nontender.  Positive bowel sounds appreciated on exam. Central nervous system: No focal neurological deficits. Extremities: Cyanosis or clubbing; trace edema appreciated bilaterally. Skin: No petechiae. Psychiatry: Judgement and insight appear normal.  Flat affect appreciated on exam.    Data Reviewed: I have personally reviewed following labs and imaging studies  CBC: Recent Labs  Lab 02/01/24 1436 02/02/24 0428 02/03/24 0438  WBC 22.5* 17.1* 13.9*  NEUTROABS 20.4*  --   --   HGB 12.9 10.2* 9.8*  HCT 38.7 31.1* 30.1*  MCV 80.6 82.3 82.7  PLT 509* 456* 425*    Basic Metabolic Panel: Recent Labs  Lab 02/01/24 1436 02/02/24 0428 02/03/24 0438  NA 136 139 139  K 3.3* 2.9* 3.8  CL 97* 102 104  CO2 20* 22 26  GLUCOSE 188* 82 116*  BUN 56* 52* 46*  CREATININE 2.39* 2.13* 1.98*  CALCIUM  9.5 8.9 8.7*  MG  --   --  2.3    GFR: Estimated Creatinine Clearance: 15.8 mL/min (A) (by C-G formula based on SCr of 1.98 mg/dL (H)).  Liver Function Tests: Recent Labs  Lab  02/01/24 1436  AST 20  ALT 16  ALKPHOS 142*  BILITOT 0.5  PROT 7.6  ALBUMIN  3.6    CBG: No results for input(s): GLUCAP in the last 168 hours.   Recent Results (from the past 240 hours)  Blood Culture (routine x 2)     Status: None (Preliminary result)   Collection Time: 02/01/24  2:47 PM   Specimen: BLOOD  Result Value Ref Range Status   Specimen Description BLOOD LEFT ARM  Final   Special Requests   Final    BOTTLES DRAWN AEROBIC AND ANAEROBIC Blood Culture results may not be optimal due to an inadequate volume of blood received in culture bottles   Culture   Final    NO GROWTH 2 DAYS Performed at [facility], [address], [place], [place] [address]    Report Status PENDING  Incomplete  Blood Culture (routine x 2)     Status: None (Preliminary result)   Collection Time: 02/01/24  3:26 PM   Specimen: Right Antecubital; Blood  Result Value Ref Range Status   Specimen Description   Final    RIGHT ANTECUBITAL BOTTLES DRAWN AEROBIC AND ANAEROBIC   Special Requests   Final  Blood Culture results may not be optimal due to an inadequate volume of blood received in culture bottles   Culture   Final    NO GROWTH 2 DAYS Performed at [facility], [address], [place], [place] [address]    Report Status PENDING  Incomplete  Urine Culture     Status: Abnormal   Collection Time: 02/01/24  4:42 PM   Specimen: Urine, Random  Result Value Ref Range Status   Specimen Description   Final    URINE, RANDOM Performed at [facility], [address] [place], [place] [address]    Special Requests   Final    NONE Reflexed from [id number] Performed at [facility], [address], [place], [place] [address]    Culture >=100,000 COLONIES/mL ESCHERICHIA COLI (A)  Final   Report Status 02/03/2024 FINAL  Final   Organism ID, Bacteria ESCHERICHIA COLI (A)  Final      Susceptibility   Escherichia coli - MIC*    AMPICILLIN >=32 RESISTANT Resistant     CEFAZOLIN (URINE) Value in next  row Resistant      >=32 RESISTANTThis is a modified FDA-approved test that has been validated and its performance characteristics determined by the reporting laboratory.  This laboratory is certified under the Clinical Laboratory Improvement Amendments CLIA as qualified to perform high complexity clinical laboratory testing.    CEFEPIME Value in next row Sensitive      >=32 RESISTANTThis is a modified FDA-approved test that has been validated and its performance characteristics determined by the reporting laboratory.  This laboratory is certified under the Clinical Laboratory Improvement Amendments CLIA as qualified to perform high complexity clinical laboratory testing.    ERTAPENEM Value in next row Sensitive      >=32 RESISTANTThis is a modified FDA-approved test that has been validated and its performance characteristics determined by the reporting laboratory.  This laboratory is certified under the Clinical Laboratory Improvement Amendments CLIA as qualified to perform high complexity clinical laboratory testing.    CEFTRIAXONE  Value in next row Sensitive      >=32 RESISTANTThis is a modified FDA-approved test that has been validated and its performance characteristics determined by the reporting laboratory.  This laboratory is certified under the Clinical Laboratory Improvement Amendments CLIA as qualified to perform high complexity clinical laboratory testing.    CIPROFLOXACIN  Value in next row Resistant      >=32 RESISTANTThis is a modified FDA-approved test that has been validated and its performance characteristics determined by the reporting laboratory.  This laboratory is certified under the Clinical Laboratory Improvement Amendments CLIA as qualified to perform high complexity clinical laboratory testing.    GENTAMICIN Value in next row Sensitive      >=32 RESISTANTThis is a modified FDA-approved test that has been validated and its performance characteristics determined by the reporting  laboratory.  This laboratory is certified under the Clinical Laboratory Improvement Amendments CLIA as qualified to perform high complexity clinical laboratory testing.    NITROFURANTOIN Value in next row Sensitive      >=32 RESISTANTThis is a modified FDA-approved test that has been validated and its performance characteristics determined by the reporting laboratory.  This laboratory is certified under the Clinical Laboratory Improvement Amendments CLIA as qualified to perform high complexity clinical laboratory testing.    TRIMETH/SULFA Value in next row Sensitive      >=32 RESISTANTThis is a modified FDA-approved test that has been validated and its performance characteristics determined by the reporting laboratory.  This  laboratory is certified under the Clinical Laboratory Improvement Amendments CLIA as qualified to perform high complexity clinical laboratory testing.    AMPICILLIN/SULBACTAM Value in next row Resistant      >=32 RESISTANTThis is a modified FDA-approved test that has been validated and its performance characteristics determined by the reporting laboratory.  This laboratory is certified under the Clinical Laboratory Improvement Amendments CLIA as qualified to perform high complexity clinical laboratory testing.    PIP/TAZO Value in next row Resistant      >=128 RESISTANTThis is a modified FDA-approved test that has been validated and its performance characteristics determined by the reporting laboratory.  This laboratory is certified under the Clinical Laboratory Improvement Amendments CLIA as qualified to perform high complexity clinical laboratory testing.    MEROPENEM Value in next row Sensitive      >=128 RESISTANTThis is a modified FDA-approved test that has been validated and its performance characteristics determined by the reporting laboratory.  This laboratory is certified under the Clinical Laboratory Improvement Amendments CLIA as qualified to perform high complexity clinical  laboratory testing.    * >=100,000 COLONIES/mL ESCHERICHIA COLI  Resp panel by RT-PCR (RSV, Flu A&B, Covid) Anterior Nasal Swab     Status: Abnormal   Collection Time: 02/01/24  5:18 PM   Specimen: Anterior Nasal Swab  Result Value Ref Range Status   SARS Coronavirus 2 by RT PCR NEGATIVE NEGATIVE Final    Comment: (NOTE) SARS-CoV-2 target nucleic acids are NOT DETECTED.  The SARS-CoV-2 RNA is generally detectable in upper respiratory specimens during the acute phase of infection. The lowest concentration of SARS-CoV-2 viral copies this assay can detect is 138 copies/mL. A negative result does not preclude SARS-Cov-2 infection and should not be used as the sole basis for treatment or other patient management decisions. A negative result may occur with  improper specimen collection/handling, submission of specimen other than nasopharyngeal swab, presence of viral mutation(s) within the areas targeted by this assay, and inadequate number of viral copies(<138 copies/mL). A negative result must be combined with clinical observations, patient history, and epidemiological information. The expected result is Negative.  Fact Sheet for Patients:  bloggercourse.com  Fact Sheet for Healthcare Providers:  seriousbroker.it  This test is no t yet approved or cleared by the [place]  FDA and  has been authorized for detection and/or diagnosis of SARS-CoV-2 by FDA under an Emergency Use Authorization (EUA). This EUA will remain  in effect (meaning this test can be used) for the duration of the COVID-19 declaration under Section 564(b)(1) of the Act, 21 U.S.C.section 360bbb-3(b)(1), unless the authorization is terminated  or revoked sooner.       Influenza A by PCR POSITIVE (A) NEGATIVE Final   Influenza B by PCR NEGATIVE NEGATIVE Final    Comment: (NOTE) The Xpert Xpress SARS-CoV-2/FLU/RSV plus assay is intended as an aid in the diagnosis  of influenza from Nasopharyngeal swab specimens and should not be used as a sole basis for treatment. Nasal washings and aspirates are unacceptable for Xpert Xpress SARS-CoV-2/FLU/RSV testing.  Fact Sheet for Patients: bloggercourse.com  Fact Sheet for Healthcare Providers: seriousbroker.it  This test is not yet approved or cleared by the [place]  FDA and has been authorized for detection and/or diagnosis of SARS-CoV-2 by FDA under an Emergency Use Authorization (EUA). This EUA will remain in effect (meaning this test can be used) for the duration of the COVID-19 declaration under Section 564(b)(1) of the Act, 21 U.S.C. section 360bbb-3(b)(1), unless the authorization is terminated  or revoked.     Resp Syncytial Virus by PCR NEGATIVE NEGATIVE Final    Comment: (NOTE) Fact Sheet for Patients: bloggercourse.com  Fact Sheet for Healthcare Providers: seriousbroker.it  This test is not yet approved or cleared by the [place]  FDA and has been authorized for detection and/or diagnosis of SARS-CoV-2 by FDA under an Emergency Use Authorization (EUA). This EUA will remain in effect (meaning this test can be used) for the duration of the COVID-19 declaration under Section 564(b)(1) of the Act, 21 U.S.C. section 360bbb-3(b)(1), unless the authorization is terminated or revoked.  Performed at [facility], [address], [place], [place] [address]      Radiology Studies: CT CHEST WO CONTRAST Result Date: 02/01/2024 CLINICAL DATA:  Cough dyspnea leukocytosis EXAM: CT CHEST WITHOUT CONTRAST TECHNIQUE: Multidetector CT imaging of the chest was performed following the standard protocol without IV contrast. RADIATION DOSE REDUCTION: This exam was performed according to the departmental dose-optimization program which includes automated exposure control, adjustment of the mA and/or kV  according to patient size and/or use of iterative reconstruction technique. COMPARISON:  Chest x-ray 02/01/2024, lumbar CT 06/06/2023 FINDINGS: Cardiovascular: Limited without intravenous contrast. Moderate aortic atherosclerosis. No aneurysm. Multi vessel coronary vascular calcification. Mild aortic valve calcifications. Cardiomegaly. No sizable pericardial effusion Mediastinum/Nodes: Patent trachea. Multiple thyroid  nodules, on the right measuring up to 2.1 cm. Multiple mildly enlarged mediastinal nodes. Precarinal node measures 14 mm. Subcarinal lymph node measures 14 mm. Esophagus within normal limits. Lungs/Pleura: Partial bilateral lower lobe consolidations. Lower lobe bronchial wall thickening and peribronchovascular thickening. Widespread bilateral tree-in-bud density consistent with respiratory infection. No pleural effusion or pneumothorax Upper Abdomen: No acute finding Musculoskeletal: Degenerative changes. Severe compression fracture T12 with gas in the adjacent disc spaces. Progressive loss of height compared with CT from May. About 5 mm retropulsion of the upper vertebral body results in mass effect on the ventral thecal sac. IMPRESSION: 1. Widespread bilateral tree-in-bud density and partial lower lobe consolidations consistent with respiratory infection/pneumonia. Imaging follow-up to resolution recommended. 2. Mildly enlarged mediastinal nodes, likely reactive. 3. Cardiomegaly. 4. Multiple thyroid  nodules measuring up to 2.1 cm. This has been evaluated on previous imaging. (ref: J Am Coll Radiol. 2015 Feb;12(2): 143-50). 5. Severe compression fracture T12 is chronic but significant progression in loss of vertebral body height compared with CT from May last year Aortic Atherosclerosis (ICD10-I70.0). Electronically Signed   By: Luke Bun M.D.   On: 02/01/2024 20:15    Scheduled Meds:  amiodarone   100 mg Oral Daily   amLODipine   5 mg Oral BID   budesonide  (PULMICORT ) nebulizer solution  0.5  mg Nebulization BID   dextromethorphan -guaiFENesin   1 tablet Oral BID   feeding supplement  1 Container Oral TID BM   heparin   5,000 Units Subcutaneous Q8H   multivitamin with minerals  1 tablet Oral Daily   pantoprazole   40 mg Oral Daily   predniSONE   40 mg Oral Q breakfast   torsemide   20 mg Oral Daily   Continuous Infusions:  cefTRIAXone  (ROCEPHIN )  IV 2 g (02/02/24 1724)   doxycycline  (VIBRAMYCIN ) IV 100 mg (02/03/24 0524)   promethazine (PHENERGAN) injection (IM or IVPB)       LOS: 2 days    Time spent: 50 minutes    Derryl Duval, MD [facility]   To contact the attending provider between 7A-7P or the covering provider during after hours 7P-7A, please log into the web site [email or web address] and access using universal [place] password for that  web site. If you do not have the password, please call the hospital operator.  02/03/2024, 4:39 PM    "

## 2024-02-03 NOTE — Progress Notes (Signed)
 Patient medical POA called requesting Hospitalist call her in the AM with update and plan of care for patient. Poa states she is unable to come to hospital as she care for grandchildren during the day and does not want to give them the flu

## 2024-02-03 NOTE — Plan of Care (Signed)
  Problem: Education: Goal: Knowledge of General Education information will improve Description: Including pain rating scale, medication(s)/side effects and non-pharmacologic comfort measures Outcome: Adequate for Discharge   Problem: Clinical Measurements: Goal: Ability to maintain clinical measurements within normal limits will improve Outcome: Adequate for Discharge Goal: Diagnostic test results will improve Outcome: Adequate for Discharge

## 2024-02-03 NOTE — Plan of Care (Signed)
   Problem: Education: Goal: Knowledge of General Education information will improve Description Including pain rating scale, medication(s)/side effects and non-pharmacologic comfort measures Outcome: Progressing   Problem: Education: Goal: Knowledge of General Education information will improve Description Including pain rating scale, medication(s)/side effects and non-pharmacologic comfort measures Outcome: Progressing

## 2024-02-03 NOTE — Progress Notes (Signed)
 Nurse at bedside,patient alert and oriented times four. Patient's blood pressure was 199/70,heart rate 80,Dr Santosh Sigdel notified.Plan of care on going.

## 2024-02-03 NOTE — Assessment & Plan Note (Signed)
-   In the setting of influenza A with pneumonia -CT shows diffuse tree-in-bud appearance consistent with pneumonia. -Continue IV antibiotics, mucolytics, bronchodilator management and steroids - Based on patient's symptom presentation out of window for the use of Tamiflu at this time - Continue supportive care and wean off oxygen  supplementation as tolerated.

## 2024-02-03 NOTE — Assessment & Plan Note (Signed)
-   Currently stable - Diet control as an outpatient - Follow CBG fluctuation.

## 2024-02-03 NOTE — Assessment & Plan Note (Signed)
-   Present at time of admission as mentioned above; patient out of window for the use of Tamiflu - There is component of reactive airway disease and patient has been started on steroids along with antibiotics with concern for subsequent bacterial pneumonia - Continue the use of flutter valve, mucolytic's, bronchodilator management and supportive care.  Urinary tract infection: Urine growing E. coli, continue ceftriaxone , follow susceptibilities

## 2024-02-04 DIAGNOSIS — J9601 Acute respiratory failure with hypoxia: Secondary | ICD-10-CM | POA: Diagnosis not present

## 2024-02-04 LAB — CBC WITH DIFFERENTIAL/PLATELET
Abs Immature Granulocytes: 0.69 K/uL — ABNORMAL HIGH (ref 0.00–0.07)
Basophils Absolute: 0.1 K/uL (ref 0.0–0.1)
Basophils Relative: 0 %
Eosinophils Absolute: 0 K/uL (ref 0.0–0.5)
Eosinophils Relative: 0 %
HCT: 30.2 % — ABNORMAL LOW (ref 36.0–46.0)
Hemoglobin: 9.8 g/dL — ABNORMAL LOW (ref 12.0–15.0)
Immature Granulocytes: 5 %
Lymphocytes Relative: 7 %
Lymphs Abs: 1 K/uL (ref 0.7–4.0)
MCH: 26.8 pg (ref 26.0–34.0)
MCHC: 32.5 g/dL (ref 30.0–36.0)
MCV: 82.7 fL (ref 80.0–100.0)
Monocytes Absolute: 1 K/uL (ref 0.1–1.0)
Monocytes Relative: 7 %
Neutro Abs: 11.9 K/uL — ABNORMAL HIGH (ref 1.7–7.7)
Neutrophils Relative %: 81 %
Platelets: 477 K/uL — ABNORMAL HIGH (ref 150–400)
RBC: 3.65 MIL/uL — ABNORMAL LOW (ref 3.87–5.11)
RDW: 16 % — ABNORMAL HIGH (ref 11.5–15.5)
WBC: 14.7 K/uL — ABNORMAL HIGH (ref 4.0–10.5)
nRBC: 0 % (ref 0.0–0.2)

## 2024-02-04 MED ORDER — BENZONATATE 100 MG PO CAPS: 200.0000 mg | ORAL_CAPSULE | Freq: Three times a day (TID) | ORAL | Status: DC | PRN

## 2024-02-04 MED ORDER — ALPRAZOLAM 1 MG PO TABS
1.0000 mg | ORAL_TABLET | Freq: Every day | ORAL | Status: DC
  Administered 2024-02-04 – 2024-02-05 (×2): 1 mg via ORAL
  Filled 2024-02-04 (×2): qty 1

## 2024-02-04 NOTE — TOC Initial Note (Signed)
 Transition of Care [facility]) - Initial/Assessment Note    Patient Details  Name: Crystal [name] MRN: [id number] Date of Birth: 04/05/1934  Transition of Care [facility]) CM/SW Contact:    Lucie Lunger, LCSWA Phone Number: 02/04/2024, 4:08 PM  Clinical Narrative:                 Pt is high risk for readmission. CSW spoke with pts nieces Virginia  and Trina to complete assessment. Pt lives with Virginia  who states that pt is fairly independent in completing her ADLs. Pts family states they cook for pt and provide transportation when needed. Pt has had HH in the past. Pt has a walker in the home that she uses to ambulate. Pt was on hospice services with [facility], pt was able to graduate from services. CSW spoke to Virginia  about SNF placement, at this time. Family is interested in SNF at [facility], SNF referral to be sent to [facility] for review. TOC to follow.   Expected Discharge Plan: Home w Home Health Services Barriers to Discharge: Continued Medical Work up   Patient Goals and CMS Choice Patient states their goals for this hospitalization and ongoing recovery are:: return home CMS Medicare.gov Compare Post Acute Care list provided to:: Patient Represenative (must comment) Choice offered to / list presented to :  ([place])      Expected Discharge Plan and Services In-house Referral: Clinical Social Work Discharge Planning Services: CM Consult   Living arrangements for the past 2 months: Single Family Home                                      Prior Living Arrangements/Services Living arrangements for the past 2 months: Single Family Home Lives with:: Relatives Patient language and need for interpreter reviewed:: Yes Do you feel safe going back to the place where you live?: Yes      Need for Family Participation in Patient Care: Yes (Comment) Care giver support system in place?: Yes (comment) Current home services: DME Criminal Activity/Legal Involvement Pertinent to Current  Situation/Hospitalization: No - Comment as needed  Activities of Daily Living   ADL Screening (condition at time of admission) Independently performs ADLs?: No Does the patient have a NEW difficulty with bathing/dressing/toileting/self-feeding that is expected to last >3 days?: Yes (Initiates electronic notice to provider for possible OT consult) Does the patient have a NEW difficulty with getting in/out of bed, walking, or climbing stairs that is expected to last >3 days?: Yes (Initiates electronic notice to provider for possible PT consult) Does the patient have a NEW difficulty with communication that is expected to last >3 days?: No Is the patient deaf or have difficulty hearing?: Yes Does the patient have difficulty seeing, even when wearing glasses/contacts?: No Does the patient have difficulty concentrating, remembering, or making decisions?: No  Permission Sought/Granted                  Emotional Assessment Appearance:: Appears stated age Attitude/Demeanor/Rapport: Engaged Affect (typically observed): Accepting   Alcohol  / Substance Use: Not Applicable Psych Involvement: No (comment)  Admission diagnosis:  Hypoxia [R09.02] Community acquired pneumonia of right lower lobe of lung [J18.9] Acute hypoxic respiratory failure (HCC) [J96.01] Patient Active Problem List   Diagnosis Date Noted   Protein-calorie malnutrition, severe 02/02/2024   Influenza A 02/01/2024   Essential hypertension 06/28/2023   Acute on chronic systolic CHF (congestive heart failure) (HCC) 06/27/2023  Anemia due to chronic kidney disease 12/02/2021   Gout 10/01/2021   Nausea & vomiting 10/01/2021   Hypertensive crisis 09/28/2021   CKD (chronic kidney disease), stage IV (HCC) 09/28/2021   Hypokalemia    Hyponatremia 08/01/2020   Ileus (HCC)    Melena 07/12/2020   Iron deficiency anemia 07/12/2020   Constipation 07/12/2020   Pleural effusion    Acute kidney injury    Shortness of breath     Flash pulmonary edema (HCC)    Hypertensive urgency    AF (paroxysmal atrial fibrillation) (HCC)    Statin myopathy 02/02/2020   Nonspecific abnormal electrocardiogram (ECG) (EKG)    Dysphagia 09/21/2019   Anemia 09/21/2019   Diarrhea 07/06/2019   Chronic combined systolic and diastolic CHF (congestive heart failure) (HCC) 02/28/2019   Acute respiratory failure with hypoxemia (HCC) 02/28/2019   Acute pulmonary edema (HCC)    Hyperlipidemia LDL goal <70    Acute renal failure superimposed on chronic kidney disease    CHF exacerbation (HCC) 02/27/2019   CAD (coronary artery disease) 02/27/2019   STEMI (ST elevation myocardial infarction) (HCC) 01/30/2019   NSTEMI (non-ST elevated myocardial infarction) (HCC) 01/30/2019   Non-ST elevation (NSTEMI) myocardial infarction [facility])    Renal artery stenosis 12/02/2017   Type 2 diabetes mellitus (HCC) 03/04/2011   Hyperlipidemia 03/04/2011   Resistant hypertension 03/04/2011   PCP:  Rosamond Leta NOVAK, MD Pharmacy:   [facility] - [place], [place] - [address] [place] [address] Phone: [phone] Fax: [phone]  [facility] [address] [place] [address] Phone: [phone] Fax: [phone]     Social Drivers of Health (SDOH) Social History: SDOH Screenings   Food Insecurity: No Food Insecurity (02/02/2024)  Housing: Low Risk (02/02/2024)  Transportation Needs: No Transportation Needs (02/02/2024)  Utilities: Not At Risk (02/02/2024)  Financial Resource Strain: Low Risk (06/24/2023)   Received from [facility]  Physical Activity: Insufficiently Active (06/24/2023)   Received from [facility]  Social Connections: Moderately Isolated (02/02/2024)  Stress: Stress Concern Present (06/24/2023)   Received from [facility]  Tobacco Use: Low Risk (02/01/2024)  Health Literacy: Low Risk (06/24/2023)   [facility] from [facility]   SDOH Interventions:     Readmission  Risk Interventions    02/04/2024    4:05 PM 10/01/2021    8:08 AM  Readmission Risk Prevention Plan  Transportation Screening Complete Complete  HRI or Home Care Consult Complete Complete  Social Work Consult for Recovery Care Planning/Counseling Complete Complete  Palliative Care Screening Not Applicable Not Applicable  Medication Review [facility]) Complete Complete

## 2024-02-04 NOTE — Progress Notes (Signed)
 " PROGRESS NOTE    Crystal [name]  FMW:[id number] DOB: Oct 27, 1934 DOA: 02/01/2024  PCP: Rosamond Leta NOVAK, MD    Chief Complaint  Patient presents with   Weakness    Brief admission narrative:  Crystal [name] is a 89 y.o. female with medical history significant for systolic and diastolic CHF, CKD 5, hypertension, diabetes mellitus, coronary artery disease, gout. Patient presented to the ED with complaints of generalized weakness of 2 weeks duration, with cough productive of brownish sputum, nausea and vomiting. She reports she last fell about a week and a half ago.  In December she fell about 6 times.  She lives with her niece.  Ambulates with a walker at baseline.  She was graduated from hospice care recently.  ED Course: Temperature 98.7.  Heart rate 71-76.  Respirate rate 18-22.  Blood pressure systolic 180s.  O2 sats down to 84% on room air, placed on 2 L sats 93%. WBC 22.5. Influenza A positive CT showed bilateral pneumonia Admitted for acute hypoxic respiratory failure, influenza, pneumonia, found to have UTI as well  Assessment & Plan:  Acute respiratory failure with hypoxemia (HCC) Influenza A, suspected bacterial pneumonia -CT shows diffuse tree-in-bud appearance consistent with pneumonia. -Continue IV antibiotics, mucolytics, bronchodilator management and steroids - Based on patient's symptom presentation out of window for the use of Tamiflu at this time - Continue supportive care and wean off oxygen  supplementation as tolerated.  Chronic combined systolic and diastolic CHF (congestive heart failure) (HCC) - Continue torsemide , daily weight, low-salt diet, intake and output monitoring   Urinary tract infection: Urine growing E. coli, continue ceftriaxone , follow susceptibilities  Essential hypertension - Continue amlodipine  5 mg twice daily  AF (paroxysmal atrial fibrillation) (HCC) - No longer on anticoagulation - Continue telemetry monitoring - Patient's  family says still takes amiodarone , will continue  CKD (chronic kidney disease), stage IV (HCC) - Appears to be stable - Minimize nephrotoxic agents - Follow renal function trend.  Type 2 diabetes mellitus (HCC) - Currently stable - Diet control as an outpatient - Follow CBG fluctuation.  Protein-calorie malnutrition, severe -Body mass index is 18.34 kg/m. - Appreciate assistance and recommendation by nutritional service - Continue feeding supplements.  Goals of care: Continue current management.  Patient graduated from hospice  DVT prophylaxis: Heparin  Code Status: DNR/DNI. Family Communication: Spoke with niece over the phone 1/7 disposition:   Status is: Inpatient Remains inpatient appropriate because: Continue IV antibiotics.   Consultants:  None  Procedures:  See below for x-ray reports.  Antimicrobials:  Zithromax and ceftriaxone    Subjective: Afebrile; says he feels better.  Less coughing.  On 2 L/min oxygen . Objective: Vitals:   02/03/24 2038 02/03/24 2100 02/04/24 0520 02/04/24 0751  BP:  (!) 156/58 (!) 187/65   Pulse:   72   Resp:      Temp:   99.2 F (37.3 C)   TempSrc:   Oral   SpO2: 96%  95% 94%  Weight:      Height:        Intake/Output Summary (Last 24 hours) at 02/04/2024 1428 Last data filed at 02/03/2024 2012 Gross per 24 hour  Intake --  Output 200 ml  Net -200 ml   Filed Weights   02/01/24 1424 02/02/24 0442  Weight: 50 kg 50 kg    Examination:  General exam: Alert and oriented, not in distress  respiratory system: Positive expiratory wheezing and bilateral rhonchi appreciated on exam. Cardiovascular system: Rate controlled, no rubs, no  gallops, no JVD. Gastrointestinal system: Abdomen is nondistended, soft and nontender.  Positive bowel sounds appreciated on exam. Central nervous system: No focal neurological deficits. Extremities: Cyanosis or clubbing; trace edema appreciated bilaterally. Skin: No petechiae. Psychiatry:  Judgement and insight appear normal.  Flat affect appreciated on exam.    Data Reviewed: I have personally reviewed following labs and imaging studies  CBC: Recent Labs  Lab 02/01/24 1436 02/02/24 0428 02/03/24 0438 02/04/24 0449  WBC 22.5* 17.1* 13.9* 14.7*  NEUTROABS 20.4*  --   --  11.9*  HGB 12.9 10.2* 9.8* 9.8*  HCT 38.7 31.1* 30.1* 30.2*  MCV 80.6 82.3 82.7 82.7  PLT 509* 456* 425* 477*    Basic Metabolic Panel: Recent Labs  Lab 02/01/24 1436 02/02/24 0428 02/03/24 0438  NA 136 139 139  K 3.3* 2.9* 3.8  CL 97* 102 104  CO2 20* 22 26  GLUCOSE 188* 82 116*  BUN 56* 52* 46*  CREATININE 2.39* 2.13* 1.98*  CALCIUM  9.5 8.9 8.7*  MG  --   --  2.3    GFR: Estimated Creatinine Clearance: 15.8 mL/min (A) (by C-G formula based on SCr of 1.98 mg/dL (H)).  Liver Function Tests: Recent Labs  Lab 02/01/24 1436  AST 20  ALT 16  ALKPHOS 142*  BILITOT 0.5  PROT 7.6  ALBUMIN  3.6    CBG: No results for input(s): GLUCAP in the last 168 hours.   Recent Results (from the past 240 hours)  Blood Culture (routine x 2)     Status: None (Preliminary result)   Collection Time: 02/01/24  2:47 PM   Specimen: BLOOD  Result Value Ref Range Status   Specimen Description BLOOD LEFT ARM  Final   Special Requests   Final    BOTTLES DRAWN AEROBIC AND ANAEROBIC Blood Culture results may not be optimal due to an inadequate volume of blood received in culture bottles   Culture   Final    NO GROWTH 3 DAYS Performed at [facility], [address] [place], [place] [address]    Report Status PENDING  Incomplete  Blood Culture (routine x 2)     Status: None (Preliminary result)   Collection Time: 02/01/24  3:26 PM   Specimen: Right Antecubital; Blood  Result Value Ref Range Status   Specimen Description   Final    RIGHT ANTECUBITAL BOTTLES DRAWN AEROBIC AND ANAEROBIC   Special Requests   Final    Blood Culture results may not be optimal due to an inadequate volume of blood  received in culture bottles   Culture   Final    NO GROWTH 3 DAYS Performed at [facility], [address] [place], [place] [address]    Report Status PENDING  Incomplete  Urine Culture     Status: Abnormal   Collection Time: 02/01/24  4:42 PM   Specimen: Urine, Random  Result Value Ref Range Status   Specimen Description   Final    URINE, RANDOM Performed at [facility], [address] [place], [place] [address]    Special Requests   Final    NONE Reflexed from [phone] Performed at [facility], [address], [place], [place] [address]    Culture >=100,000 COLONIES/mL ESCHERICHIA COLI (A)  Final   Report Status 02/03/2024 FINAL  Final   Organism ID, Bacteria ESCHERICHIA COLI (A)  Final      Susceptibility   Escherichia coli - MIC*    AMPICILLIN >=32 RESISTANT Resistant  CEFAZOLIN (URINE) Value in next row Resistant      >=32 RESISTANTThis is a modified FDA-approved test that has been validated and its performance characteristics determined by the reporting laboratory.  This laboratory is certified under the Clinical Laboratory Improvement Amendments CLIA as qualified to perform high complexity clinical laboratory testing.    CEFEPIME Value in next row Sensitive      >=32 RESISTANTThis is a modified FDA-approved test that has been validated and its performance characteristics determined by the reporting laboratory.  This laboratory is certified under the Clinical Laboratory Improvement Amendments CLIA as qualified to perform high complexity clinical laboratory testing.    ERTAPENEM Value in next row Sensitive      >=32 RESISTANTThis is a modified FDA-approved test that has been validated and its performance characteristics determined by the reporting laboratory.  This laboratory is certified under the Clinical Laboratory Improvement Amendments CLIA as qualified to perform high complexity clinical laboratory testing.    CEFTRIAXONE  Value in next row Sensitive      >=32  RESISTANTThis is a modified FDA-approved test that has been validated and its performance characteristics determined by the reporting laboratory.  This laboratory is certified under the Clinical Laboratory Improvement Amendments CLIA as qualified to perform high complexity clinical laboratory testing.    CIPROFLOXACIN  Value in next row Resistant      >=32 RESISTANTThis is a modified FDA-approved test that has been validated and its performance characteristics determined by the reporting laboratory.  This laboratory is certified under the Clinical Laboratory Improvement Amendments CLIA as qualified to perform high complexity clinical laboratory testing.    GENTAMICIN Value in next row Sensitive      >=32 RESISTANTThis is a modified FDA-approved test that has been validated and its performance characteristics determined by the reporting laboratory.  This laboratory is certified under the Clinical Laboratory Improvement Amendments CLIA as qualified to perform high complexity clinical laboratory testing.    NITROFURANTOIN Value in next row Sensitive      >=32 RESISTANTThis is a modified FDA-approved test that has been validated and its performance characteristics determined by the reporting laboratory.  This laboratory is certified under the Clinical Laboratory Improvement Amendments CLIA as qualified to perform high complexity clinical laboratory testing.    TRIMETH/SULFA Value in next row Sensitive      >=32 RESISTANTThis is a modified FDA-approved test that has been validated and its performance characteristics determined by the reporting laboratory.  This laboratory is certified under the Clinical Laboratory Improvement Amendments CLIA as qualified to perform high complexity clinical laboratory testing.    AMPICILLIN/SULBACTAM Value in next row Resistant      >=32 RESISTANTThis is a modified FDA-approved test that has been validated and its performance characteristics determined by the reporting  laboratory.  This laboratory is certified under the Clinical Laboratory Improvement Amendments CLIA as qualified to perform high complexity clinical laboratory testing.    PIP/TAZO Value in next row Resistant      >=128 RESISTANTThis is a modified FDA-approved test that has been validated and its performance characteristics determined by the reporting laboratory.  This laboratory is certified under the Clinical Laboratory Improvement Amendments CLIA as qualified to perform high complexity clinical laboratory testing.    MEROPENEM Value in next row Sensitive      >=128 RESISTANTThis is a modified FDA-approved test that has been validated and its performance characteristics determined by the reporting laboratory.  This laboratory is certified under the Clinical Laboratory Improvement Amendments CLIA as qualified to perform  high complexity clinical laboratory testing.    * >=100,000 COLONIES/mL ESCHERICHIA COLI  Resp panel by RT-PCR (RSV, Flu A&B, Covid) Anterior Nasal Swab     Status: Abnormal   Collection Time: 02/01/24  5:18 PM   Specimen: Anterior Nasal Swab  Result Value Ref Range Status   SARS Coronavirus 2 by RT PCR NEGATIVE NEGATIVE Final    Comment: (NOTE) SARS-CoV-2 target nucleic acids are NOT DETECTED.  The SARS-CoV-2 RNA is generally detectable in upper respiratory specimens during the acute phase of infection. The lowest concentration of SARS-CoV-2 viral copies this assay can detect is 138 copies/mL. A negative result does not preclude SARS-Cov-2 infection and should not be used as the sole basis for treatment or other patient management decisions. A negative result may occur with  improper specimen collection/handling, submission of specimen other than nasopharyngeal swab, presence of viral mutation(s) within the areas targeted by this assay, and inadequate number of viral copies(<138 copies/mL). A negative result must be combined with clinical observations, patient history, and  epidemiological information. The expected result is Negative.  Fact Sheet for Patients:  bloggercourse.com  Fact Sheet for Healthcare Providers:  seriousbroker.it  This test is no t yet approved or cleared by the [place]  FDA and  has been authorized for detection and/or diagnosis of SARS-CoV-2 by FDA under an Emergency Use Authorization (EUA). This EUA will remain  in effect (meaning this test can be used) for the duration of the COVID-19 declaration under Section 564(b)(1) of the Act, 21 U.S.C.section 360bbb-3(b)(1), unless the authorization is terminated  or revoked sooner.       Influenza A by PCR POSITIVE (A) NEGATIVE Final   Influenza B by PCR NEGATIVE NEGATIVE Final    Comment: (NOTE) The Xpert Xpress SARS-CoV-2/FLU/RSV plus assay is intended as an aid in the diagnosis of influenza from Nasopharyngeal swab specimens and should not be used as a sole basis for treatment. Nasal washings and aspirates are unacceptable for Xpert Xpress SARS-CoV-2/FLU/RSV testing.  Fact Sheet for Patients: bloggercourse.com  Fact Sheet for Healthcare Providers: seriousbroker.it  This test is not yet approved or cleared by the [place]  FDA and has been authorized for detection and/or diagnosis of SARS-CoV-2 by FDA under an Emergency Use Authorization (EUA). This EUA will remain in effect (meaning this test can be used) for the duration of the COVID-19 declaration under Section 564(b)(1) of the Act, 21 U.S.C. section 360bbb-3(b)(1), unless the authorization is terminated or revoked.     Resp Syncytial Virus by PCR NEGATIVE NEGATIVE Final    Comment: (NOTE) Fact Sheet for Patients: bloggercourse.com  Fact Sheet for Healthcare Providers: seriousbroker.it  This test is not yet approved or cleared by the [place]  FDA and has  been authorized for detection and/or diagnosis of SARS-CoV-2 by FDA under an Emergency Use Authorization (EUA). This EUA will remain in effect (meaning this test can be used) for the duration of the COVID-19 declaration under Section 564(b)(1) of the Act, 21 U.S.C. section 360bbb-3(b)(1), unless the authorization is terminated or revoked.  Performed at [facility], [address] [place], [place] [address]      Radiology Studies: No results found.   Scheduled Meds:  amiodarone   100 mg Oral Daily   amLODipine   5 mg Oral BID   budesonide  (PULMICORT ) nebulizer solution  0.5 mg Nebulization BID   dextromethorphan -guaiFENesin   1 tablet Oral BID   feeding supplement  1 Container Oral TID BM   heparin   5,000 Units Subcutaneous Q8H  multivitamin with minerals  1 tablet Oral Daily   pantoprazole   40 mg Oral Daily   predniSONE   40 mg Oral Q breakfast   torsemide   20 mg Oral Daily   Continuous Infusions:  cefTRIAXone  (ROCEPHIN )  IV 2 g (02/03/24 1734)   doxycycline  (VIBRAMYCIN ) IV 100 mg (02/04/24 0457)   promethazine (PHENERGAN) injection (IM or IVPB)       LOS: 3 days    Time spent: 50 minutes    Derryl Duval, MD [facility]   To contact the attending provider between 7A-7P or the covering provider during after hours 7P-7A, please log into the web site [email or web address] and access using universal [place] password for that web site. If you do not have the password, please call the hospital operator.  02/04/2024, 2:28 PM    "

## 2024-02-04 NOTE — Evaluation (Signed)
 Physical Therapy Evaluation Patient Details Name: Crystal [name] MRN: [id number] DOB: 1934/02/28 Today's Date: 02/04/2024  History of Present Illness  Per MD:  Crystal [name] is a 89 y.o. female with medical history significant for systolic and diastolic CHF, CKD 5, hypertension, diabetes mellitus, coronary artery disease, gout.  Patient presented to the ED with complaints of generalized weakness of 2 weeks duration, with cough productive of brownish sputum, nausea and vomiting. She reports she last fell about a week and a half ago.  In December she fell about 6 times.  She lives with her niece.  Ambulates with a walker at baseline.  She was graduated from hospice care recently.   PT was found to have B pneumonia, UTI  and Flu A  Clinical Impression  PT very agreeable to PT states she wants to be able to walk again as soon as possible.  At this time pt becomes SOB quickly with motion.  Pt will benefit from SNF to maximize her functional ability and decrease the burden of care  on her care taker.      If plan is discharge home, recommend the following: A lot of help with walking and/or transfers;A lot of help with bathing/dressing/bathroom;Assistance with cooking/housework;Assist for transportation;Help with stairs or ramp for entrance   Can travel by private vehicle    yes    Equipment Recommendations None recommended by PT  Recommendations for Other Services       Functional Status Assessment Patient has had a recent decline in their functional status and demonstrates the ability to make significant improvements in function in a reasonable and predictable amount of time.     Precautions / Restrictions Precautions Precautions: Fall Restrictions Weight Bearing Restrictions Per Provider Order: No      Mobility  Bed Mobility Overal bed mobility: Needs Assistance Bed Mobility: Rolling Rolling: Min assist           Patient Response: Cooperative  Transfers Overall  transfer level: Needs assistance   Transfers: Sit to/from Stand Sit to Stand: Mod assist                Ambulation/Gait               General Gait Details: pt becomes to SOB  Stairs            Wheelchair Mobility     Tilt Bed Tilt Bed Patient Response: Cooperative          Pertinent Vitals/Pain Pain Assessment Pain Assessment: No/denies pain    Home Living Family/patient expects to be discharged to:: Private residence Living Arrangements: Other relatives Available Help at Discharge: Family;Available 24 hours/day Type of Home: House Home Access: Stairs to enter Entrance Stairs-Rails: None Entrance Stairs-Number of Steps: 2   Home Layout: One level Home Equipment: Agricultural Consultant (2 wheels);Tub bench;BSC/3in1 Additional Comments: PT has been living with niece    Prior Function Prior Level of Function : Patient poor historian/Family not available;Independent/Modified Independent;History of Falls (last six months)             Mobility Comments: PT wants to become I with ambulation again she was I 7 months ago, at this time she needs assist sit to stand and then can walk less than 10 feet  due to weakness and SOB       Extremity/Trunk Assessment        Lower Extremity Assessment Lower Extremity Assessment: Generalized weakness       Communication   Communication Communication:  No apparent difficulties    Cognition Arousal: Alert Behavior During Therapy: WFL for tasks assessed/performed   PT - Cognitive impairments: No apparent impairments                         Following commands: Intact       Cueing Cueing Techniques: Verbal cues     General Comments      Exercises General Exercises - Lower Extremity Ankle Circles/Pumps: Both, 10 reps Gluteal Sets: Both, 10 reps Heel Slides: Both, 10 reps Hip ABduction/ADduction: Both, 10 reps   Assessment/Plan    PT Assessment Patient needs continued PT services  PT  Problem List Decreased strength;Decreased activity tolerance;Decreased balance;Decreased mobility;Cardiopulmonary status limiting activity       PT Treatment Interventions Gait training;Functional mobility training;Therapeutic activities;Therapeutic exercise    PT Goals (Current goals can be found in the Care Plan section)       Frequency Min 3X/week     Co-evaluation               AM-PAC PT 6 Clicks Mobility  Outcome Measure Help needed turning from your back to your side while in a flat bed without using bedrails?: A Little Help needed moving from lying on your back to sitting on the side of a flat bed without using bedrails?: A Lot Help needed moving to and from a bed to a chair (including a wheelchair)?: A Lot Help needed standing up from a chair using your arms (e.g., wheelchair or bedside chair)?: A Lot Help needed to walk in hospital room?: A Little Help needed climbing 3-5 steps with a railing? : A Lot 6 Click Score: 14    End of Session Equipment Utilized During Treatment: Oxygen  Activity Tolerance: Other (comment) (PT is limited by SOB) Patient left: in bed;with call bell/phone within reach   PT Visit Diagnosis: Unsteadiness on feet (R26.81);Other abnormalities of gait and mobility (R26.89);History of falling (Z91.81);Repeated falls (R29.6);Muscle weakness (generalized) (M62.81);Difficulty in walking, not elsewhere classified (R26.2)    Time: 8460-8395 PT Time Calculation (min) (ACUTE ONLY): 25 min   Charges:   PT Evaluation $PT Eval Low Complexity: 1 Low   PT General Charges $$ ACUTE PT VISIT: 1 Visit          Montie Metro, PT CLT [phone]  02/04/2024, 4:10 PM

## 2024-02-04 NOTE — NC FL2 (Signed)
 " [place]  MEDICAID FL2 LEVEL OF CARE FORM     IDENTIFICATION  Patient Name: Crystal [name] Birthdate: Jan 10, 1935 Sex: female Admission Date (Current Location): 02/01/2024  [facility] and [place] Number:  [employer] and Address:  [facility],  [address], [place] [address]      Provider Number: [id number]  Attending Physician Name and Address:  Mcarthur Pick, MD  Relative Name and Phone Number:  [place]  Gladis (niece) [phone]    Current Level of Care: Hospital Recommended Level of Care: Skilled Nursing Facility Prior Approval Number:    Date Approved/Denied:   PASRR Number: [id number]  Discharge Plan: SNF    Current Diagnoses: Patient Active Problem List   Diagnosis Date Noted   Protein-calorie malnutrition, severe 02/02/2024   Influenza A 02/01/2024   Essential hypertension 06/28/2023   Acute on chronic systolic CHF (congestive heart failure) (HCC) 06/27/2023   Anemia due to chronic kidney disease 12/02/2021   Gout 10/01/2021   Nausea & vomiting 10/01/2021   Hypertensive crisis 09/28/2021   CKD (chronic kidney disease), stage IV (HCC) 09/28/2021   Hypokalemia    Hyponatremia 08/01/2020   Ileus (HCC)    Melena 07/12/2020   Iron deficiency anemia 07/12/2020   Constipation 07/12/2020   Pleural effusion    Acute kidney injury    Shortness of breath    Flash pulmonary edema (HCC)    Hypertensive urgency    AF (paroxysmal atrial fibrillation) (HCC)    Statin myopathy 02/02/2020   Nonspecific abnormal electrocardiogram (ECG) (EKG)    Dysphagia 09/21/2019   Anemia 09/21/2019   Diarrhea 07/06/2019   Chronic combined systolic and diastolic CHF (congestive heart failure) (HCC) 02/28/2019   Acute respiratory failure with hypoxemia (HCC) 02/28/2019   Acute pulmonary edema (HCC)    Hyperlipidemia LDL goal <70    Acute renal failure superimposed on chronic kidney disease    CHF exacerbation (HCC) 02/27/2019   CAD (coronary artery  disease) 02/27/2019   STEMI (ST elevation myocardial infarction) (HCC) 01/30/2019   NSTEMI (non-ST elevated myocardial infarction) (HCC) 01/30/2019   Non-ST elevation (NSTEMI) myocardial infarction [facility])    Renal artery stenosis 12/02/2017   Type 2 diabetes mellitus (HCC) 03/04/2011   Hyperlipidemia 03/04/2011   Resistant hypertension 03/04/2011    Orientation RESPIRATION BLADDER Height & Weight     Self, Time, Place  O2 (2L) Continent Weight: 110 lb 3.7 oz (50 kg) Height:  5' 5 (165.1 cm)  BEHAVIORAL SYMPTOMS/MOOD NEUROLOGICAL BOWEL NUTRITION STATUS      Continent Diet (Regular)  AMBULATORY STATUS COMMUNICATION OF NEEDS Skin   Extensive Assist Verbally Normal                       Personal Care Assistance Level of Assistance  Bathing, Feeding, Dressing Bathing Assistance: Maximum assistance Feeding assistance: Independent Dressing Assistance: Maximum assistance     Functional Limitations Info  Sight, Hearing, Speech Sight Info: Adequate Hearing Info: Adequate Speech Info: Adequate    SPECIAL CARE FACTORS FREQUENCY  PT (By licensed PT), OT (By licensed OT)     PT Frequency: 5 x a week OT Frequency: 5 x a week            Contractures Contractures Info: Not present    Additional Factors Info  Code Status, Allergies Code Status Info: DNR-Limited Allergies Info: Bactrim (Sulfamethoxazole-trimethoprim), Sulfa Antibiotics, Clonidine And Derivatives, Evista (Raloxifene Hcl), Fosamax (Alendronate Sodium), Glipizide, Lipitor (Atorvastatin Calcium ), Losartan , Pravastatin, Azithromycin  Current Medications (02/04/2024):  This is the current hospital active medication list Current Facility-Administered Medications  Medication Dose Route Frequency Provider Last Rate Last Admin   acetaminophen  (TYLENOL ) tablet 650 mg  650 mg Oral Q6H PRN Emokpae, Ejiroghene E, MD       Or   acetaminophen  (TYLENOL ) suppository 650 mg  650 mg Rectal Q6H PRN Emokpae,  Ejiroghene E, MD       albuterol  (PROVENTIL ) (2.5 MG/3ML) 0.083% nebulizer solution 2.5 mg  2.5 mg Nebulization Q4H PRN Emokpae, Ejiroghene E, MD   2.5 mg at 02/04/24 0751   ALPRAZolam  (XANAX ) tablet 1 mg  1 mg Oral QHS Sigdel, Santosh, MD       amiodarone  (PACERONE ) tablet 100 mg  100 mg Oral Daily Sigdel, Santosh, MD   100 mg at 02/04/24 0910   amLODipine  (NORVASC ) tablet 5 mg  5 mg Oral BID Sigdel, Santosh, MD   5 mg at 02/04/24 0909   benzonatate  (TESSALON ) capsule 200 mg  200 mg Oral TID PRN Sigdel, Santosh, MD       budesonide  (PULMICORT ) nebulizer solution 0.5 mg  0.5 mg Nebulization BID Ricky Fines, MD   0.5 mg at 02/04/24 9247   cefTRIAXone  (ROCEPHIN ) 2 g in sodium chloride  0.9 % 100 mL IVPB  2 g Intravenous Q24H Shona Laurence N, DO 200 mL/hr at 02/03/24 1734 2 g at 02/03/24 1734   dextromethorphan -guaiFENesin  (MUCINEX  DM) 30-600 MG per 12 hr tablet 1 tablet  1 tablet Oral BID Ricky Fines, MD   1 tablet at 02/04/24 0910   doxycycline  (VIBRAMYCIN ) 100 mg in sodium chloride  0.9 % 250 mL IVPB  100 mg Intravenous Q12H Shona Laurence SAILOR, DO 125 mL/hr at 02/04/24 0457 100 mg at 02/04/24 0457   feeding supplement (BOOST / RESOURCE BREEZE) liquid 1 Container  1 Container Oral TID BM Ricky Fines, MD   1 Container at 02/04/24 1424   heparin  injection 5,000 Units  5,000 Units Subcutaneous Q8H Emokpae, Ejiroghene E, MD   5,000 Units at 02/04/24 1424   labetalol  (NORMODYNE ) injection 10 mg  10 mg Intravenous Q2H PRN Emokpae, Ejiroghene E, MD   10 mg at 02/03/24 1952   multivitamin with minerals tablet 1 tablet  1 tablet Oral Daily Ricky Fines, MD   1 tablet at 02/04/24 9090   pantoprazole  (PROTONIX ) EC tablet 40 mg  40 mg Oral Daily Madera, Carlos, MD   40 mg at 02/04/24 9090   polyethylene glycol (MIRALAX  / GLYCOLAX ) packet 17 g  17 g Oral Daily PRN Emokpae, Ejiroghene E, MD       predniSONE  (DELTASONE ) tablet 40 mg  40 mg Oral Q breakfast Ricky Fines, MD   40 mg at 02/04/24 9089    promethazine (PHENERGAN) 12.5 mg in sodium chloride  0.9 % 50 mL IVPB  12.5 mg Intravenous Q6H PRN Emokpae, Ejiroghene E, MD       torsemide  (DEMADEX ) tablet 20 mg  20 mg Oral Daily Emokpae, Ejiroghene E, MD   20 mg at 02/04/24 0909     Discharge Medications: Please see discharge summary for a list of discharge medications.  Relevant Imaging Results:  Relevant Lab Results:   Additional Information SS# [id number]  Noreen KATHEE Pinal, LCSWA     "

## 2024-02-05 DIAGNOSIS — J9601 Acute respiratory failure with hypoxia: Secondary | ICD-10-CM | POA: Diagnosis not present

## 2024-02-05 MED ORDER — METOPROLOL SUCCINATE ER 25 MG PO TB24
25.0000 mg | ORAL_TABLET | Freq: Every day | ORAL | Status: DC
  Administered 2024-02-05 – 2024-02-06 (×2): 25 mg via ORAL
  Filled 2024-02-05 (×2): qty 1

## 2024-02-05 NOTE — Plan of Care (Signed)
" °  Problem: Acute Rehab PT Goals(only PT should resolve) Goal: Pt Will Go Supine/Side To Sit Outcome: Progressing Flowsheets (Taken 02/05/2024 1516) Pt will go Supine/Side to Sit:  with contact guard assist  with minimal assist Goal: Patient Will Transfer Sit To/From Stand Outcome: Progressing Flowsheets (Taken 02/05/2024 1516) Patient will transfer sit to/from stand: with minimal assist Goal: Pt Will Transfer Bed To Chair/Chair To Bed Outcome: Progressing Flowsheets (Taken 02/05/2024 1516) Pt will Transfer Bed to Chair/Chair to Bed: with min assist Goal: Pt Will Ambulate Outcome: Progressing Flowsheets (Taken 02/05/2024 1516) Pt will Ambulate:  25 feet  with minimal assist  with rolling walker  with moderate assist   3:16 PM, 02/05/2024 Lynwood Music, MPT Physical Therapist with [facility] [phone] office [phone] mobile phone  "

## 2024-02-05 NOTE — Care Management Important Message (Signed)
 Important Message  Patient Details  Name: Crystal [name] MRN: [id number] Date of Birth: [birth year]   Important Message Given:  Yes - Medicare IM     [name] [name] 02/05/2024, 11:49 AM

## 2024-02-05 NOTE — TOC Progression Note (Signed)
 Transition of Care [facility]) - Progression Note    Patient Details  Name: Crystal [name] MRN: [id number] Date of Birth: 1934/07/30  Transition of Care [facility]) CM/SW Contact  Sharlyne Stabs, RN Phone Number: 02/05/2024, 11:55 AM  Clinical Narrative:   No bed offers. [facility] will not have beds until next week due to flu, CM spoke with [place]  to explain. Expanded the bed search and will follow up with [place]  to make a bed choice.    Expected Discharge Plan: Home w Home Health Services Barriers to Discharge: Continued Medical Work up    Expected Discharge Plan and Services In-house Referral: Clinical Social Work Discharge Planning Services: CM Consult   Living arrangements for the past 2 months: Single Family Home       Social Drivers of Health (SDOH) Interventions SDOH Screenings   Food Insecurity: No Food Insecurity (02/02/2024)  Housing: Low Risk (02/02/2024)  Transportation Needs: No Transportation Needs (02/02/2024)  Utilities: Not At Risk (02/02/2024)  Financial Resource Strain: Low Risk (06/24/2023)   Received from [facility]  Physical Activity: Insufficiently Active (06/24/2023)   Received from [facility]  Social Connections: Moderately Isolated (02/02/2024)  Stress: Stress Concern Present (06/24/2023)   Received from [facility]  Tobacco Use: Low Risk (02/01/2024)  Health Literacy: Low Risk (06/24/2023)   Received from [facility]    Readmission Risk Interventions    02/04/2024    4:05 PM 10/01/2021    8:08 AM  Readmission Risk Prevention Plan  Transportation Screening Complete Complete  HRI or Home Care Consult Complete Complete  Social Work Consult for Recovery Care Planning/Counseling Complete Complete  Palliative Care Screening Not Applicable Not Applicable  Medication Review Oceanographer) Complete Complete

## 2024-02-05 NOTE — Progress Notes (Signed)
 Physical Therapy Treatment Patient Details Name: Crystal [name] MRN: [id number] DOB: 08-Oct-1934 Today's Date: 02/05/2024   History of Present Illness Per MD:  Crystal [name] Oddi is a 89 y.o. female with medical history significant for systolic and diastolic CHF, CKD 5, hypertension, diabetes mellitus, coronary artery disease, gout.  Patient presented to the ED with complaints of generalized weakness of 2 weeks duration, with cough productive of brownish sputum, nausea and vomiting. She reports she last fell about a week and a half ago.  In December she fell about 6 times.  She lives with her niece.  Ambulates with a walker at baseline.  She was graduated from hospice care recently.   PT was found to have B pneumonia, UTI  and Flu A    PT Comments  Patient agreeable for therapy. Patient demonstrates slow labored movement for sitting up at bedside, once seated able to maintain balance while completing BLE exercise, unsteady on feet an limited to a few side steps before having to sit due to weakness and fatigue. Patient tolerated sitting up in chair after therapy on room air with SpO2 at 93% on room air - RN notified. Patient will benefit from continued skilled physical therapy in hospital and recommended venue below to increase strength, balance, endurance for safe ADLs and gait.      If plan is discharge home, recommend the following: A lot of help with bathing/dressing/bathroom;A lot of help with walking and/or transfers;Help with stairs or ramp for entrance;Assist for transportation;Assistance with cooking/housework   Can travel by private vehicle     Yes  Equipment Recommendations  None recommended by PT    Recommendations for Other Services       Precautions / Restrictions Precautions Precautions: Fall Recall of Precautions/Restrictions: Intact Restrictions Weight Bearing Restrictions Per Provider Order: No     Mobility  Bed Mobility Overal bed mobility: Needs Assistance Bed  Mobility: Supine to Sit Rolling: Min assist         General bed mobility comments: increased time, labored movement    Transfers Overall transfer level: Needs assistance Equipment used: Rolling walker (2 wheels) Transfers: Sit to/from Stand, Bed to chair/wheelchair/BSC Sit to Stand: Mod assist   Step pivot transfers: Mod assist       General transfer comment: unsteady labored movement with buckling of knees due to  weakness    Ambulation/Gait Ambulation/Gait assistance: Mod assist, Max assist Gait Distance (Feet): 4 Feet Assistive device: Rolling walker (2 wheels) Gait Pattern/deviations: Decreased step length - right, Decreased step length - left, Decreased stride length Gait velocity: decreased     General Gait Details: limited to a few slow labored unsteady side steps with buckling of knees due to weakness   Stairs             Wheelchair Mobility     Tilt Bed    Modified Rankin (Stroke Patients Only)       Balance Overall balance assessment: Needs assistance Sitting-balance support: Feet supported, No upper extremity supported Sitting balance-Leahy Scale: Fair Sitting balance - Comments: fair/good seated at EOB   Standing balance support: Reliant on assistive device for balance, During functional activity, Bilateral upper extremity supported Standing balance-Leahy Scale: Poor Standing balance comment: using RW                            Communication Communication Communication: No apparent difficulties  Cognition Arousal: Alert Behavior During Therapy: WFL for tasks assessed/performed  Following commands: Intact      Cueing Cueing Techniques: Verbal cues  Exercises General Exercises - Lower Extremity Long Arc Quad: Seated, AROM, Strengthening, Both, 10 reps Hip Flexion/Marching: Seated, AROM, Strengthening, Both, 10 reps Toe Raises: Seated, AROM, Strengthening, Both, 10 reps Heel  Raises: Seated, AROM, Strengthening, Both, 10 reps    General Comments        Pertinent Vitals/Pain Pain Assessment Pain Assessment: No/denies pain    Home Living                          Prior Function            PT Goals (current goals can now be found in the care plan section) Progress towards PT goals: Progressing toward goals    Frequency    Min 3X/week      PT Plan      Co-evaluation              AM-PAC PT 6 Clicks Mobility   Outcome Measure  Help needed turning from your back to your side while in a flat bed without using bedrails?: A Little Help needed moving from lying on your back to sitting on the side of a flat bed without using bedrails?: A Little Help needed moving to and from a bed to a chair (including a wheelchair)?: A Lot Help needed standing up from a chair using your arms (e.g., wheelchair or bedside chair)?: A Lot Help needed to walk in hospital room?: A Lot Help needed climbing 3-5 steps with a railing? : A Lot 6 Click Score: 14    End of Session   Activity Tolerance: Patient tolerated treatment well;Patient limited by fatigue Patient left: in chair;with call bell/phone within reach Nurse Communication: Mobility status PT Visit Diagnosis: Unsteadiness on feet (R26.81);Other abnormalities of gait and mobility (R26.89);Muscle weakness (generalized) (M62.81)     Time: 8881-8859 PT Time Calculation (min) (ACUTE ONLY): 22 min  Charges:    $Therapeutic Exercise: 8-22 mins $Therapeutic Activity: 8-22 mins PT General Charges $$ ACUTE PT VISIT: 1 Visit                     3:15 PM, 02/05/2024 Lynwood Music, MPT Physical Therapist with [facility] 336 [phone] office [phone] mobile phone

## 2024-02-05 NOTE — Progress Notes (Signed)
 " PROGRESS NOTE    Crystal [name]  FMW:[id number] DOB: May 29, 1934 DOA: 02/01/2024  PCP: Crystal Leta NOVAK, MD    Chief Complaint  Patient presents with   Weakness    Brief admission narrative:  Crystal [name] is a 89 y.o. female with medical history significant for systolic and diastolic CHF, CKD 5, hypertension, diabetes mellitus, coronary artery disease, gout. Patient presented to the ED with complaints of generalized weakness of 2 weeks duration, with cough productive of brownish sputum, nausea and vomiting. She reports she last fell about a week and a half ago.  In December she fell about 6 times.  She lives with her niece.  Ambulates with a walker at baseline.  She was graduated from hospice care recently.  ED Course: Temperature 98.7.  Heart rate 71-76.  Respirate rate 18-22.  Blood pressure systolic 180s.  O2 sats down to 84% on room air, placed on 2 L sats 93%. WBC 22.5. Influenza A positive CT showed bilateral pneumonia Admitted for acute hypoxic respiratory failure, influenza, pneumonia, found to have UTI as well  Assessment & Plan:  Acute respiratory failure with hypoxemia (HCC) Influenza A, suspected bacterial pneumonia -CT shows diffuse tree-in-bud appearance consistent with pneumonia. -Continue IV antibiotics, mucolytics, bronchodilator management and steroids - Based on patient's symptom presentation out of window for the use of Tamiflu at this time - Continue supportive care and wean off oxygen  supplementation as tolerated.  Chronic combined systolic and diastolic CHF (congestive heart failure) (HCC) - Continue torsemide , daily weight, low-salt diet, intake and output monitoring   Urinary tract infection: Urine growing E. coli, resistant to ampicillin/sulbactam, cefazolin, piperacillin tazobactam.  Will continue ceftriaxone   Essential hypertension, not controlled. - Continue amlodipine  5 mg twice daily, add metoprolol .  AF (paroxysmal atrial fibrillation)  (HCC) - No longer on anticoagulation - Continue telemetry monitoring - Patient's family says still takes amiodarone , will continue  CKD (chronic kidney disease), stage IV (HCC) - Appears to be stable - Minimize nephrotoxic agents - Follow renal function trend.  Type 2 diabetes mellitus (HCC) - Currently stable - Diet control as an outpatient - Follow CBG fluctuation.  Protein-calorie malnutrition, severe -Body mass index is 18.34 kg/m. - Appreciate assistance and recommendation by nutritional service - Continue feeding supplements.  Goals of care: Continue current management.  Patient was recently at hospice and graduated.  Plan to discharge to skilled nursing facility for therapies.  Currently no bed offers  DVT prophylaxis: Heparin  Code Status: DNR/DNI. Family Communication: Spoke with niece over the phone 1/7 disposition: SNF  Status is: Inpatient Remains inpatient appropriate because: Continue IV antibiotics.   Consultants:  None  Procedures:  See below for x-ray reports.  Antimicrobials:  Zithromax and ceftriaxone    Subjective: Afebrile;  feels better.  Less coughing.  On 2 L/min oxygen .  Overall improving.  Denies urinary symptoms.  Blood pressure above goal. Objective: Vitals:   02/04/24 2040 02/04/24 2349 02/05/24 0444 02/05/24 0753  BP: (!) 198/70 (!) 187/69 (!) 179/69   Pulse: 81  65   Resp: 20  16   Temp: 98.5 F (36.9 C)  98.2 F (36.8 C)   TempSrc: Oral  Oral   SpO2: 95%  93% 93%  Weight:      Height:        Intake/Output Summary (Last 24 hours) at 02/05/2024 1230 Last data filed at 02/05/2024 0918 Gross per 24 hour  Intake 540 ml  Output 1125 ml  Net -585 ml   [employer]  02/01/24 1424 02/02/24 0442  Weight: 50 kg 50 kg    Examination:  General exam: Alert and oriented, not in distress  respiratory system: Positive expiratory wheezing and bilateral rhonchi appreciated on exam. Cardiovascular system: Rate controlled, no rubs, no  gallops, no JVD. Gastrointestinal system: Abdomen is nondistended, soft and nontender.  Positive bowel sounds appreciated on exam. Central nervous system: No focal neurological deficits. Extremities: Cyanosis or clubbing; trace edema appreciated bilaterally. Skin: No petechiae. Psychiatry: Judgement and insight appear normal.  Flat affect appreciated on exam.    Data Reviewed: I have personally reviewed following labs and imaging studies  CBC: Recent Labs  Lab 02/01/24 1436 02/02/24 0428 02/03/24 0438 02/04/24 0449  WBC 22.5* 17.1* 13.9* 14.7*  NEUTROABS 20.4*  --   --  11.9*  HGB 12.9 10.2* 9.8* 9.8*  HCT 38.7 31.1* 30.1* 30.2*  MCV 80.6 82.3 82.7 82.7  PLT 509* 456* 425* 477*    Basic Metabolic Panel: Recent Labs  Lab 02/01/24 1436 02/02/24 0428 02/03/24 0438  NA 136 139 139  K 3.3* 2.9* 3.8  CL 97* 102 104  CO2 20* 22 26  GLUCOSE 188* 82 116*  BUN 56* 52* 46*  CREATININE 2.39* 2.13* 1.98*  CALCIUM  9.5 8.9 8.7*  MG  --   --  2.3    GFR: Estimated Creatinine Clearance: 15.8 mL/min (A) (by C-G formula based on SCr of 1.98 mg/dL (H)).  Liver Function Tests: Recent Labs  Lab 02/01/24 1436  AST 20  ALT 16  ALKPHOS 142*  BILITOT 0.5  PROT 7.6  ALBUMIN  3.6    CBG: No results for input(s): GLUCAP in the last 168 hours.   Recent Results (from the past 240 hours)  Blood Culture (routine x 2)     Status: None (Preliminary result)   Collection Time: 02/01/24  2:47 PM   Specimen: BLOOD  Result Value Ref Range Status   Specimen Description BLOOD LEFT ARM  Final   Special Requests   Final    BOTTLES DRAWN AEROBIC AND ANAEROBIC Blood Culture results may not be optimal due to an inadequate volume of blood received in culture bottles   Culture   Final    NO GROWTH 4 DAYS Performed at [facility], [address], [place], [place] [address]    Report Status PENDING  Incomplete  Blood Culture (routine x 2)     Status: None (Preliminary result)   Collection  Time: 02/01/24  3:26 PM   Specimen: Right Antecubital; Blood  Result Value Ref Range Status   Specimen Description   Final    RIGHT ANTECUBITAL BOTTLES DRAWN AEROBIC AND ANAEROBIC   Special Requests   Final    Blood Culture results may not be optimal due to an inadequate volume of blood received in culture bottles   Culture   Final    NO GROWTH 4 DAYS Performed at [facility], [address], [place], [place] [address]    Report Status PENDING  Incomplete  Urine Culture     Status: Abnormal   Collection Time: 02/01/24  4:42 PM   Specimen: Urine, Random  Result Value Ref Range Status   Specimen Description   Final    URINE, RANDOM Performed at [facility], [address], [place], [place] [address]    Special Requests   Final    NONE Reflexed from [id number] Performed at [facility], [address], [place], [place] [address]    Culture >=100,000 COLONIES/mL ESCHERICHIA COLI (A)  Final   Report Status 02/03/2024 FINAL  Final   Organism ID, Bacteria ESCHERICHIA COLI (A)  Final      Susceptibility   Escherichia coli - MIC*    AMPICILLIN >=32 RESISTANT Resistant     CEFAZOLIN (URINE) Value in next row Resistant      >=32 RESISTANTThis is a modified FDA-approved test that has been validated and its performance characteristics determined by the reporting laboratory.  This laboratory is certified under the Clinical Laboratory Improvement Amendments CLIA as qualified to perform high complexity clinical laboratory testing.    CEFEPIME Value in next row Sensitive      >=32 RESISTANTThis is a modified FDA-approved test that has been validated and its performance characteristics determined by the reporting laboratory.  This laboratory is certified under the Clinical Laboratory Improvement Amendments CLIA as qualified to perform high complexity clinical laboratory testing.    ERTAPENEM Value in next row Sensitive      >=32 RESISTANTThis is a modified FDA-approved test that has been validated and  its performance characteristics determined by the reporting laboratory.  This laboratory is certified under the Clinical Laboratory Improvement Amendments CLIA as qualified to perform high complexity clinical laboratory testing.    CEFTRIAXONE  Value in next row Sensitive      >=32 RESISTANTThis is a modified FDA-approved test that has been validated and its performance characteristics determined by the reporting laboratory.  This laboratory is certified under the Clinical Laboratory Improvement Amendments CLIA as qualified to perform high complexity clinical laboratory testing.    CIPROFLOXACIN  Value in next row Resistant      >=32 RESISTANTThis is a modified FDA-approved test that has been validated and its performance characteristics determined by the reporting laboratory.  This laboratory is certified under the Clinical Laboratory Improvement Amendments CLIA as qualified to perform high complexity clinical laboratory testing.    GENTAMICIN Value in next row Sensitive      >=32 RESISTANTThis is a modified FDA-approved test that has been validated and its performance characteristics determined by the reporting laboratory.  This laboratory is certified under the Clinical Laboratory Improvement Amendments CLIA as qualified to perform high complexity clinical laboratory testing.    NITROFURANTOIN Value in next row Sensitive      >=32 RESISTANTThis is a modified FDA-approved test that has been validated and its performance characteristics determined by the reporting laboratory.  This laboratory is certified under the Clinical Laboratory Improvement Amendments CLIA as qualified to perform high complexity clinical laboratory testing.    TRIMETH/SULFA Value in next row Sensitive      >=32 RESISTANTThis is a modified FDA-approved test that has been validated and its performance characteristics determined by the reporting laboratory.  This laboratory is certified under the Clinical Laboratory Improvement  Amendments CLIA as qualified to perform high complexity clinical laboratory testing.    AMPICILLIN/SULBACTAM Value in next row Resistant      >=32 RESISTANTThis is a modified FDA-approved test that has been validated and its performance characteristics determined by the reporting laboratory.  This laboratory is certified under the Clinical Laboratory Improvement Amendments CLIA as qualified to perform high complexity clinical laboratory testing.    PIP/TAZO Value in next row Resistant      >=128 RESISTANTThis is a modified FDA-approved test that has been validated and its performance characteristics determined by the reporting laboratory.  This laboratory is certified under the Clinical Laboratory Improvement Amendments CLIA as qualified to perform high complexity clinical laboratory testing.    MEROPENEM Value in next row  Sensitive      >=128 RESISTANTThis is a modified FDA-approved test that has been validated and its performance characteristics determined by the reporting laboratory.  This laboratory is certified under the Clinical Laboratory Improvement Amendments CLIA as qualified to perform high complexity clinical laboratory testing.    * >=100,000 COLONIES/mL ESCHERICHIA COLI  Resp panel by RT-PCR (RSV, Flu A&B, Covid) Anterior Nasal Swab     Status: Abnormal   Collection Time: 02/01/24  5:18 PM   Specimen: Anterior Nasal Swab  Result Value Ref Range Status   SARS Coronavirus 2 by RT PCR NEGATIVE NEGATIVE Final    Comment: (NOTE) SARS-CoV-2 target nucleic acids are NOT DETECTED.  The SARS-CoV-2 RNA is generally detectable in upper respiratory specimens during the acute phase of infection. The lowest concentration of SARS-CoV-2 viral copies this assay can detect is 138 copies/mL. A negative result does not preclude SARS-Cov-2 infection and should not be used as the sole basis for treatment or other patient management decisions. A negative result may occur with  improper specimen  collection/handling, submission of specimen other than nasopharyngeal swab, presence of viral mutation(s) within the areas targeted by this assay, and inadequate number of viral copies(<138 copies/mL). A negative result must be combined with clinical observations, patient history, and epidemiological information. The expected result is Negative.  Fact Sheet for Patients:  bloggercourse.com  Fact Sheet for Healthcare Providers:  seriousbroker.it  This test is no t yet approved or cleared by the [place]  FDA and  has been authorized for detection and/or diagnosis of SARS-CoV-2 by FDA under an Emergency Use Authorization (EUA). This EUA will remain  in effect (meaning this test can be used) for the duration of the COVID-19 declaration under Section 564(b)(1) of the Act, 21 U.S.C.section 360bbb-3(b)(1), unless the authorization is terminated  or revoked sooner.       Influenza A by PCR POSITIVE (A) NEGATIVE Final   Influenza B by PCR NEGATIVE NEGATIVE Final    Comment: (NOTE) The Xpert Xpress SARS-CoV-2/FLU/RSV plus assay is intended as an aid in the diagnosis of influenza from Nasopharyngeal swab specimens and should not be used as a sole basis for treatment. Nasal washings and aspirates are unacceptable for Xpert Xpress SARS-CoV-2/FLU/RSV testing.  Fact Sheet for Patients: bloggercourse.com  Fact Sheet for Healthcare Providers: seriousbroker.it  This test is not yet approved or cleared by the [place]  FDA and has been authorized for detection and/or diagnosis of SARS-CoV-2 by FDA under an Emergency Use Authorization (EUA). This EUA will remain in effect (meaning this test can be used) for the duration of the COVID-19 declaration under Section 564(b)(1) of the Act, 21 U.S.C. section 360bbb-3(b)(1), unless the authorization is terminated or revoked.     Resp Syncytial  Virus by PCR NEGATIVE NEGATIVE Final    Comment: (NOTE) Fact Sheet for Patients: bloggercourse.com  Fact Sheet for Healthcare Providers: seriousbroker.it  This test is not yet approved or cleared by the [place]  FDA and has been authorized for detection and/or diagnosis of SARS-CoV-2 by FDA under an Emergency Use Authorization (EUA). This EUA will remain in effect (meaning this test can be used) for the duration of the COVID-19 declaration under Section 564(b)(1) of the Act, 21 U.S.C. section 360bbb-3(b)(1), unless the authorization is terminated or revoked.  Performed at [facility], [address] [place], [place] [address]      Radiology Studies: No results found.   Scheduled Meds:  ALPRAZolam   1 mg Oral QHS   amiodarone   100  mg Oral Daily   amLODipine   5 mg Oral BID   budesonide  (PULMICORT ) nebulizer solution  0.5 mg Nebulization BID   dextromethorphan -guaiFENesin   1 tablet Oral BID   feeding supplement  1 Container Oral TID BM   heparin   5,000 Units Subcutaneous Q8H   metoprolol  succinate  25 mg Oral Daily   multivitamin with minerals  1 tablet Oral Daily   pantoprazole   40 mg Oral Daily   torsemide   20 mg Oral Daily   Continuous Infusions:  cefTRIAXone  (ROCEPHIN )  IV Stopped (02/04/24 1743)   doxycycline  (VIBRAMYCIN ) IV 100 mg (02/05/24 0526)   promethazine (PHENERGAN) injection (IM or IVPB)       LOS: 4 days    Time spent: 50 minutes    Derryl Duval, MD [facility]   To contact the attending provider between 7A-7P or the covering provider during after hours 7P-7A, please log into the web site [email or web address] and access using universal  password for that web site. If you do not have the password, please call the hospital operator.  02/05/2024, 12:30 PM    "

## 2024-02-06 DIAGNOSIS — L8990 Pressure ulcer of unspecified site, unspecified stage: Secondary | ICD-10-CM | POA: Insufficient documentation

## 2024-02-06 DIAGNOSIS — J9601 Acute respiratory failure with hypoxia: Secondary | ICD-10-CM | POA: Diagnosis not present

## 2024-02-06 LAB — CULTURE, BLOOD (ROUTINE X 2)
Culture: NO GROWTH
Culture: NO GROWTH

## 2024-02-06 MED ORDER — AMLODIPINE BESYLATE 5 MG PO TABS: 5.0000 mg | ORAL_TABLET | Freq: Two times a day (BID) | ORAL | 0 refills | Status: AC

## 2024-02-06 MED ORDER — CEFDINIR 300 MG PO CAPS: 300.0000 mg | ORAL_CAPSULE | Freq: Every day | ORAL | 0 refills | Status: AC

## 2024-02-06 MED ORDER — METOPROLOL SUCCINATE ER 25 MG PO TB24: 25.0000 mg | ORAL_TABLET | Freq: Every day | ORAL | 0 refills | Status: DC

## 2024-02-06 MED ORDER — METOPROLOL SUCCINATE ER 50 MG PO TB24: 50.0000 mg | ORAL_TABLET | Freq: Every day | ORAL | 0 refills | Status: AC

## 2024-02-06 MED ORDER — IPRATROPIUM-ALBUTEROL 0.5-2.5 (3) MG/3ML IN SOLN: 3.0000 mL | Freq: Four times a day (QID) | RESPIRATORY_TRACT | 0 refills | Status: AC

## 2024-02-06 MED ORDER — DM-GUAIFENESIN ER 30-600 MG PO TB12: 1.0000 | ORAL_TABLET | Freq: Two times a day (BID) | ORAL | Status: AC | PRN

## 2024-02-06 NOTE — Discharge Summary (Addendum)
 Physician Discharge Summary  Crystal [name] FMW:[id number] DOB: 11/05/1934 DOA: 02/01/2024  PCP: Rosamond Leta NOVAK, MD  Admit date: 02/01/2024 Discharge date: 02/06/2024  Admitted From: Home Disposition: SNF  Recommendations for Outpatient Follow-up:  Follow up with PCP in 1 week with repeat CBC/BMP Follow up in ED if symptoms worsen   Discharge Condition: Stable CODE STATUS: DNR Diet recommendation: Heart healthy/fluid restriction/low sodium  Brief/Interim Summary:  Crystal [name] is a 89 y.o. female with medical history significant for systolic and diastolic CHF, LVEF less than 20%, CKD 5, hypertension, diabetes mellitus, coronary artery disease, gout. Patient presented to the ED with complaints of generalized weakness of 2 weeks duration, with cough productive of brownish sputum, nausea and vomiting. She reports she last fell about a week and a half ago.  In December she fell about 6 times.  She lives with her niece.  Ambulates with a walker at baseline.  She was graduated from hospice care recently.   ED Course: Temperature 98.7.  Heart rate 71-76.  Respirate rate 18-22.  Blood pressure systolic 180s.  O2 sats down to 84% on room air, placed on 2 L sats 93%. WBC 22.5. Influenza A positive CT showed bilateral pneumonia Admitted for acute hypoxic respiratory failure, influenza, pneumonia, found to have UTI as well   Discharge Diagnoses:  Principal Problem:   Acute respiratory failure with hypoxemia (HCC) Active Problems:   Chronic combined systolic and diastolic CHF (congestive heart failure) (HCC)   Influenza A   Essential hypertension   AF (paroxysmal atrial fibrillation) (HCC)   CKD (chronic kidney disease), stage IV (HCC)   Type 2 diabetes mellitus (HCC)   Protein-calorie malnutrition, severe   Pressure injury of skin   Acute respiratory failure with hypoxemia (HCC) Influenza A, suspected bacterial pneumonia -CT shows diffuse tree-in-bud appearance consistent with  pneumonia. -Continue IV antibiotics, mucolytics, bronchodilator management and steroids - Will prescribe cefdinir  for 3 more days to complete about 7 days of treatment. Stop date 02/09/24 --Out of window for Tamiflu, does not need to be on precautions anymore - Continue supportive care and wean off oxygen  supplementation as tolerated.   Chronic combined systolic and diastolic CHF (congestive heart failure) (HCC) - LVEF less than 20% per echo in June 2025. -Continue torsemide , daily weight, low-salt diet, intake and output monitoring  -Fluid restriction less than 2L/d - Started on metoprolol  50 daily.  GDMT limited by CKD.  Patient was under hospice care and graduated.  Can follow-up with cardiology or continue follow-up with PCP -BMP in 1 week   Urinary tract infection: Urine growing E. coli, resistant to ampicillin/sulbactam, cefazolin, piperacillin tazobactam.  Will prescribe cefdinir  300 mg daily for 3 more doses. Stop 02/09/24  Essential hypertension, not controlled. - Amlodipine  increased to 5 mg twice daily, metoprolol  50 mg daily.  Follow-up outpatient with PCP  AF (paroxysmal atrial fibrillation) (HCC) - No longer on anticoagulation - Continue telemetry monitoring - Patient's family says still takes amiodarone , will continue   CKD (chronic kidney disease), stage IV (HCC) - Appears to be stable - Minimize nephrotoxic agents - Follow renal function trend.   Type 2 diabetes mellitus (HCC) - Currently stable - Diet control as an outpatient - Follow CBG fluctuation.   Protein-calorie malnutrition, severe -Body mass index is 18.34 kg/m. - Appreciate assistance and recommendation by nutritional service - Continue feeding supplements.  sacral pressure ulcer , continue daily dressing   Goals of care: Continue current management.  Patient was recently at hospice and  graduated.  Plan to discharge to skilled nursing facility on 1/10  DVT prophylaxis: Heparin  Code Status:  DNR/DNI.    Discharge Instructions  Discharge Instructions     (HEART FAILURE PATIENTS) Call MD:  Anytime you have any of the following symptoms: 1) 3 pound weight gain in 24 hours or 5 pounds in 1 week 2) shortness of breath, with or without a dry hacking cough 3) swelling in the hands, feet or stomach 4) if you have to sleep on extra pillows at night in order to breathe.   Complete by: As directed    Ambulatory referral to Cardiology   Complete by: As directed    F/u 2 weeks   Call MD for:  difficulty breathing, headache or visual disturbances   Complete by: As directed    Call MD for:  temperature >100.4   Complete by: As directed    Change dressing (specify)   Complete by: As directed    Dressing change: daily   Diet - low sodium heart healthy   Complete by: As directed    Fluid restriction 2000 mL/day, 2 g sodium diet   Discharge instructions   Complete by: As directed    1.  Complete 3 more days of antibiotics for pneumonia as well as UTI 2.  Follow fluid restriction/low-salt diet 3.  Follow-up with PCP within a week.   Increase activity slowly   Complete by: As directed       Allergies as of 02/06/2024       Reactions   Bactrim [sulfamethoxazole-trimethoprim] Nausea And Vomiting   Sulfa Antibiotics Nausea And Vomiting   Clonidine And Derivatives Other (See Comments)   FATIGUE   Evista [raloxifene Hcl] Other (See Comments)   GERD   Fosamax [alendronate Sodium] Other (See Comments)   INCREASED GERD    Glipizide Other (See Comments)   PALPITATIONS   Lipitor [atorvastatin Calcium ] Other (See Comments)   ACHING   Losartan  Other (See Comments)   FATIGUE    Pravastatin Other (See Comments)   ACHING   Azithromycin Rash, Other (See Comments)   FACIAL BURNING         Medication List     STOP taking these medications    amoxicillin 500 MG capsule Commonly known as: AMOXIL   Eliquis  2.5 MG Tabs tablet Generic drug: apixaban    predniSONE  10 MG  tablet Commonly known as: DELTASONE        TAKE these medications    acetaminophen  500 MG tablet Commonly known as: TYLENOL  Take 500 mg by mouth every 6 (six) hours as needed for headache (pain).   ALPRAZolam  1 MG tablet Commonly known as: XANAX  Take 1 mg by mouth at bedtime.   amiodarone  200 MG tablet Commonly known as: PACERONE  Take 0.5 tablets (100 mg total) by mouth daily.   amLODipine  5 MG tablet Commonly known as: NORVASC  Take 1 tablet (5 mg total) by mouth 2 (two) times daily. What changed: when to take this   benzonatate  200 MG capsule Commonly known as: TESSALON  Take 200 mg by mouth 3 (three) times daily as needed.   cefdinir  300 MG capsule Commonly known as: OMNICEF  Take 1 capsule (300 mg total) by mouth daily.   dextromethorphan -guaiFENesin  30-600 MG 12hr tablet Commonly known as: MUCINEX  DM Take 1 tablet by mouth 2 (two) times daily as needed for cough.   docusate sodium  100 MG capsule Commonly known as: COLACE Take 100 mg by mouth daily as needed for mild constipation.   ipratropium-albuterol   0.5-2.5 (3) MG/3ML Soln Commonly known as: DUONEB Take 3 mLs by nebulization every 6 (six) hours.   meclizine  25 MG tablet Commonly known as: ANTIVERT  Take 25 mg by mouth as needed for dizziness.   metoprolol  succinate 50 MG 24 hr tablet Commonly known as: Toprol  XL Take 1 tablet (50 mg total) by mouth daily. Take with or immediately following a meal.   multivitamin with minerals Tabs tablet Take 1 tablet by mouth daily. Centrum Silver for Women   nitroGLYCERIN  0.4 MG SL tablet Commonly known as: NITROSTAT  PLACE 1 TAB UNDER TONGUE EVERY 3 MINUTES AS DIRECTED UP TO 3 TIMES AS NEEDED FOR CHEST PAIN.   polyethylene glycol powder 17 GM/SCOOP powder Commonly known as: GLYCOLAX /MIRALAX  Take 17 g by mouth daily as needed for mild constipation.   torsemide  20 MG tablet Commonly known as: DEMADEX  Take 1 tablet (20 mg total) by mouth daily.                Discharge Care Instructions  (From admission, onward)           Start     Ordered   02/06/24 0000  Change dressing (specify)       Comments: Dressing change: daily   02/06/24 1231            Contact information for follow-up providers     Vyas, Leta B, MD. Schedule an appointment as soon as possible for a visit in 1 week(s).   Specialty: Internal Medicine Contact information: [address] [place] [address] [phone]              Contact information for after-discharge care     Destination     [facility] .   Service: Skilled Nursing Contact information: [address] [facility] [place]  [address] [phone]                    Allergies[1]  Consultations:    Procedures/Studies: CT CHEST WO CONTRAST Result Date: 02/01/2024 CLINICAL DATA:  Cough dyspnea leukocytosis EXAM: CT CHEST WITHOUT CONTRAST TECHNIQUE: Multidetector CT imaging of the chest was performed following the standard protocol without IV contrast. RADIATION DOSE REDUCTION: This exam was performed according to the departmental dose-optimization program which includes automated exposure control, adjustment of the mA and/or kV according to patient size and/or use of iterative reconstruction technique. COMPARISON:  Chest x-ray 02/01/2024, lumbar CT 06/06/2023 FINDINGS: Cardiovascular: Limited without intravenous contrast. Moderate aortic atherosclerosis. No aneurysm. Multi vessel coronary vascular calcification. Mild aortic valve calcifications. Cardiomegaly. No sizable pericardial effusion Mediastinum/Nodes: Patent trachea. Multiple thyroid  nodules, on the right measuring up to 2.1 cm. Multiple mildly enlarged mediastinal nodes. Precarinal node measures 14 mm. Subcarinal lymph node measures 14 mm. Esophagus within normal limits. Lungs/Pleura: Partial bilateral lower lobe consolidations. Lower lobe bronchial wall thickening and peribronchovascular thickening. Widespread  bilateral tree-in-bud density consistent with respiratory infection. No pleural effusion or pneumothorax Upper Abdomen: No acute finding Musculoskeletal: Degenerative changes. Severe compression fracture T12 with gas in the adjacent disc spaces. Progressive loss of height compared with CT from May. About 5 mm retropulsion of the upper vertebral body results in mass effect on the ventral thecal sac. IMPRESSION: 1. Widespread bilateral tree-in-bud density and partial lower lobe consolidations consistent with respiratory infection/pneumonia. Imaging follow-up to resolution recommended. 2. Mildly enlarged mediastinal nodes, likely reactive. 3. Cardiomegaly. 4. Multiple thyroid  nodules measuring up to 2.1 cm. This has been evaluated on previous imaging. (ref: J Am Coll Radiol. 2015 Feb;12(2): 143-50).  5. Severe compression fracture T12 is chronic but significant progression in loss of vertebral body height compared with CT from May last year Aortic Atherosclerosis (ICD10-I70.0). Electronically Signed   By: Luke Bun M.D.   On: 02/01/2024 20:15   DG Chest Port 1 View Result Date: 02/01/2024 CLINICAL DATA:  Weakness cough EXAM: PORTABLE CHEST 1 VIEW COMPARISON:  06/29/2023, 06/27/2023, 09/28/2021 FINDINGS: Cardiomegaly with central vascular congestion. No focal consolidation, pleural effusion or pneumothorax. Aortic atherosclerosis. Apical pleuroparenchymal scarring. Clips in the right axilla. Bandlike atelectasis or scarring in the right infrahilar lung. Bronchitic changes. IMPRESSION: Cardiomegaly with central vascular congestion. Bronchitic changes in the lower lungs with scarring or atelectasis in the right infrahilar lung. Electronically Signed   By: Luke Bun M.D.   On: 02/01/2024 15:55      Subjective:   Discharge Exam: Vitals:   02/06/24 0835 02/06/24 0917  BP: (!) 182/58   Pulse:    Resp:    Temp:    SpO2:  94%    General: Pt is alert, awake, not in acute distress Cardiovascular: rate  controlled, S1/S2 + Respiratory: bilateral decreased breath sounds at bases Abdominal: Soft, NT, ND, bowel sounds + Extremities: no edema, no cyanosis    The results of significant diagnostics from this hospitalization (including imaging, microbiology, ancillary and laboratory) are listed below for reference.     Microbiology: Recent Results (from the past 240 hours)  Blood Culture (routine x 2)     Status: None   Collection Time: 02/01/24  2:47 PM   Specimen: BLOOD  Result Value Ref Range Status   Specimen Description BLOOD LEFT ARM  Final   Special Requests   Final    BOTTLES DRAWN AEROBIC AND ANAEROBIC Blood Culture results may not be optimal due to an inadequate volume of blood received in culture bottles   Culture   Final    NO GROWTH 5 DAYS Performed at [facility], [address] [place], [place] [address]    Report Status 02/06/2024 FINAL  Final  Blood Culture (routine x 2)     Status: None   Collection Time: 02/01/24  3:26 PM   Specimen: Right Antecubital; Blood  Result Value Ref Range Status   Specimen Description   Final    RIGHT ANTECUBITAL BOTTLES DRAWN AEROBIC AND ANAEROBIC   Special Requests   Final    Blood Culture results may not be optimal due to an inadequate volume of blood received in culture bottles   Culture   Final    NO GROWTH 5 DAYS Performed at [facility], [address] [place], [place] [address]    Report Status 02/06/2024 FINAL  Final  Urine Culture     Status: Abnormal   Collection Time: 02/01/24  4:42 PM   Specimen: Urine, Random  Result Value Ref Range Status   Specimen Description   Final    URINE, RANDOM Performed at [facility], [address] [place], [place] [address]    Special Requests   Final    NONE Reflexed from [phone] Performed at [facility], [address] [place], [place] [address]    Culture >=100,000 COLONIES/mL ESCHERICHIA COLI (A)  Final   Report Status 02/03/2024 FINAL  Final   Organism ID, Bacteria  ESCHERICHIA COLI (A)  Final      Susceptibility   Escherichia coli - MIC*    AMPICILLIN >=32 RESISTANT Resistant     CEFAZOLIN (URINE) Value in next row Resistant      >=  32 RESISTANTThis is a modified FDA-approved test that has been validated and its performance characteristics determined by the reporting laboratory.  This laboratory is certified under the Clinical Laboratory Improvement Amendments CLIA as qualified to perform high complexity clinical laboratory testing.    CEFEPIME Value in next row Sensitive      >=32 RESISTANTThis is a modified FDA-approved test that has been validated and its performance characteristics determined by the reporting laboratory.  This laboratory is certified under the Clinical Laboratory Improvement Amendments CLIA as qualified to perform high complexity clinical laboratory testing.    ERTAPENEM Value in next row Sensitive      >=32 RESISTANTThis is a modified FDA-approved test that has been validated and its performance characteristics determined by the reporting laboratory.  This laboratory is certified under the Clinical Laboratory Improvement Amendments CLIA as qualified to perform high complexity clinical laboratory testing.    CEFTRIAXONE  Value in next row Sensitive      >=32 RESISTANTThis is a modified FDA-approved test that has been validated and its performance characteristics determined by the reporting laboratory.  This laboratory is certified under the Clinical Laboratory Improvement Amendments CLIA as qualified to perform high complexity clinical laboratory testing.    CIPROFLOXACIN  Value in next row Resistant      >=32 RESISTANTThis is a modified FDA-approved test that has been validated and its performance characteristics determined by the reporting laboratory.  This laboratory is certified under the Clinical Laboratory Improvement Amendments CLIA as qualified to perform high complexity clinical laboratory testing.    GENTAMICIN Value in next row  Sensitive      >=32 RESISTANTThis is a modified FDA-approved test that has been validated and its performance characteristics determined by the reporting laboratory.  This laboratory is certified under the Clinical Laboratory Improvement Amendments CLIA as qualified to perform high complexity clinical laboratory testing.    NITROFURANTOIN Value in next row Sensitive      >=32 RESISTANTThis is a modified FDA-approved test that has been validated and its performance characteristics determined by the reporting laboratory.  This laboratory is certified under the Clinical Laboratory Improvement Amendments CLIA as qualified to perform high complexity clinical laboratory testing.    TRIMETH/SULFA Value in next row Sensitive      >=32 RESISTANTThis is a modified FDA-approved test that has been validated and its performance characteristics determined by the reporting laboratory.  This laboratory is certified under the Clinical Laboratory Improvement Amendments CLIA as qualified to perform high complexity clinical laboratory testing.    AMPICILLIN/SULBACTAM Value in next row Resistant      >=32 RESISTANTThis is a modified FDA-approved test that has been validated and its performance characteristics determined by the reporting laboratory.  This laboratory is certified under the Clinical Laboratory Improvement Amendments CLIA as qualified to perform high complexity clinical laboratory testing.    PIP/TAZO Value in next row Resistant      >=128 RESISTANTThis is a modified FDA-approved test that has been validated and its performance characteristics determined by the reporting laboratory.  This laboratory is certified under the Clinical Laboratory Improvement Amendments CLIA as qualified to perform high complexity clinical laboratory testing.    MEROPENEM Value in next row Sensitive      >=128 RESISTANTThis is a modified FDA-approved test that has been validated and its performance characteristics determined by the  reporting laboratory.  This laboratory is certified under the Clinical Laboratory Improvement Amendments CLIA as qualified to perform high complexity clinical laboratory testing.    * >=100,000 COLONIES/mL ESCHERICHIA  COLI  Resp panel by RT-PCR (RSV, Flu A&B, Covid) Anterior Nasal Swab     Status: Abnormal   Collection Time: 02/01/24  5:18 PM   Specimen: Anterior Nasal Swab  Result Value Ref Range Status   SARS Coronavirus 2 by RT PCR NEGATIVE NEGATIVE Final    Comment: (NOTE) SARS-CoV-2 target nucleic acids are NOT DETECTED.  The SARS-CoV-2 RNA is generally detectable in upper respiratory specimens during the acute phase of infection. The lowest concentration of SARS-CoV-2 viral copies this assay can detect is 138 copies/mL. A negative result does not preclude SARS-Cov-2 infection and should not be used as the sole basis for treatment or other patient management decisions. A negative result may occur with  improper specimen collection/handling, submission of specimen other than nasopharyngeal swab, presence of viral mutation(s) within the areas targeted by this assay, and inadequate number of viral copies(<138 copies/mL). A negative result must be combined with clinical observations, patient history, and epidemiological information. The expected result is Negative.  Fact Sheet for Patients:  bloggercourse.com  Fact Sheet for Healthcare Providers:  seriousbroker.it  This test is no t yet approved or cleared by the [place]  FDA and  has been authorized for detection and/or diagnosis of SARS-CoV-2 by FDA under an Emergency Use Authorization (EUA). This EUA will remain  in effect (meaning this test can be used) for the duration of the COVID-19 declaration under Section 564(b)(1) of the Act, 21 U.S.C.section 360bbb-3(b)(1), unless the authorization is terminated  or revoked sooner.       Influenza A by PCR POSITIVE (A) NEGATIVE  Final   Influenza B by PCR NEGATIVE NEGATIVE Final    Comment: (NOTE) The Xpert Xpress SARS-CoV-2/FLU/RSV plus assay is intended as an aid in the diagnosis of influenza from Nasopharyngeal swab specimens and should not be used as a sole basis for treatment. Nasal washings and aspirates are unacceptable for Xpert Xpress SARS-CoV-2/FLU/RSV testing.  Fact Sheet for Patients: bloggercourse.com  Fact Sheet for Healthcare Providers: seriousbroker.it  This test is not yet approved or cleared by the [place]  FDA and has been authorized for detection and/or diagnosis of SARS-CoV-2 by FDA under an Emergency Use Authorization (EUA). This EUA will remain in effect (meaning this test can be used) for the duration of the COVID-19 declaration under Section 564(b)(1) of the Act, 21 U.S.C. section 360bbb-3(b)(1), unless the authorization is terminated or revoked.     Resp Syncytial Virus by PCR NEGATIVE NEGATIVE Final    Comment: (NOTE) Fact Sheet for Patients: bloggercourse.com  Fact Sheet for Healthcare Providers: seriousbroker.it  This test is not yet approved or cleared by the [place]  FDA and has been authorized for detection and/or diagnosis of SARS-CoV-2 by FDA under an Emergency Use Authorization (EUA). This EUA will remain in effect (meaning this test can be used) for the duration of the COVID-19 declaration under Section 564(b)(1) of the Act, 21 U.S.C. section 360bbb-3(b)(1), unless the authorization is terminated or revoked.  Performed at [facility], [address] [place], [place] [address]      Labs: BNP (last 3 results) Recent Labs    06/27/23 1815 06/29/23 0317  BNP >4,500.0* 3,602.4*   Basic Metabolic Panel: Recent Labs  Lab 02/01/24 1436 02/02/24 0428 02/03/24 0438  NA 136 139 139  K 3.3* 2.9* 3.8  CL 97* 102 104  CO2 20* 22 26  GLUCOSE 188* 82  116*  BUN 56* 52* 46*  CREATININE 2.39* 2.13* 1.98*  CALCIUM  9.5 8.9 8.7*  MG  --   --  2.3   Liver Function Tests: Recent Labs  Lab 02/01/24 1436  AST 20  ALT 16  ALKPHOS 142*  BILITOT 0.5  PROT 7.6  ALBUMIN  3.6   No results for input(s): LIPASE, AMYLASE in the last 168 hours. No results for input(s): AMMONIA in the last 168 hours. CBC: Recent Labs  Lab 02/01/24 1436 02/02/24 0428 02/03/24 0438 02/04/24 0449  WBC 22.5* 17.1* 13.9* 14.7*  NEUTROABS 20.4*  --   --  11.9*  HGB 12.9 10.2* 9.8* 9.8*  HCT 38.7 31.1* 30.1* 30.2*  MCV 80.6 82.3 82.7 82.7  PLT 509* 456* 425* 477*   Cardiac Enzymes: No results for input(s): CKTOTAL, CKMB, CKMBINDEX, TROPONINI in the last 168 hours. BNP: Invalid input(s): POCBNP CBG: No results for input(s): GLUCAP in the last 168 hours. D-Dimer No results for input(s): DDIMER in the last 72 hours. Hgb A1c No results for input(s): HGBA1C in the last 72 hours. Lipid Profile No results for input(s): CHOL, HDL, LDLCALC, TRIG, CHOLHDL, LDLDIRECT in the last 72 hours. Thyroid  function studies No results for input(s): TSH, T4TOTAL, T3FREE, THYROIDAB in the last 72 hours.  Invalid input(s): FREET3 Anemia work up No results for input(s): VITAMINB12, FOLATE, FERRITIN, TIBC, IRON, RETICCTPCT in the last 72 hours. Urinalysis    Component Value Date/Time   COLORURINE YELLOW 02/01/2024 1642   APPEARANCEUR HAZY (A) 02/01/2024 1642   LABSPEC 1.011 02/01/2024 1642   PHURINE 5.0 02/01/2024 1642   GLUCOSEU NEGATIVE 02/01/2024 1642   HGBUR NEGATIVE 02/01/2024 1642   BILIRUBINUR NEGATIVE 02/01/2024 1642   KETONESUR NEGATIVE 02/01/2024 1642   PROTEINUR 100 (A) 02/01/2024 1642   NITRITE NEGATIVE 02/01/2024 1642   LEUKOCYTESUR LARGE (A) 02/01/2024 1642   Sepsis Labs Recent Labs  Lab 02/01/24 1436 02/02/24 0428 02/03/24 0438 02/04/24 0449  WBC 22.5* 17.1* 13.9* 14.7*    Microbiology Recent Results (from the past 240 hours)  Blood Culture (routine x 2)     Status: None   Collection Time: 02/01/24  2:47 PM   Specimen: BLOOD  Result Value Ref Range Status   Specimen Description BLOOD LEFT ARM  Final   Special Requests   Final    BOTTLES DRAWN AEROBIC AND ANAEROBIC Blood Culture results may not be optimal due to an inadequate volume of blood received in culture bottles   Culture   Final    NO GROWTH 5 DAYS Performed at [facility], [address], [place], [place] [address]    Report Status 02/06/2024 FINAL  Final  Blood Culture (routine x 2)     Status: None   Collection Time: 02/01/24  3:26 PM   Specimen: Right Antecubital; Blood  Result Value Ref Range Status   Specimen Description   Final    RIGHT ANTECUBITAL BOTTLES DRAWN AEROBIC AND ANAEROBIC   Special Requests   Final    Blood Culture results may not be optimal due to an inadequate volume of blood received in culture bottles   Culture   Final    NO GROWTH 5 DAYS Performed at [facility], [address] [place], [place] [address]    Report Status 02/06/2024 FINAL  Final  Urine Culture     Status: Abnormal   Collection Time: 02/01/24  4:42 PM   Specimen: Urine, Random  Result Value Ref Range Status   Specimen Description   Final    URINE, RANDOM Performed at [facility], [address] [place], [place] [address]    Special Requests   Final  NONE Reflexed from [id number] Performed at [facility], [address], [place], [place] [address]    Culture >=100,000 COLONIES/mL ESCHERICHIA COLI (A)  Final   Report Status 02/03/2024 FINAL  Final   Organism ID, Bacteria ESCHERICHIA COLI (A)  Final      Susceptibility   Escherichia coli - MIC*    AMPICILLIN >=32 RESISTANT Resistant     CEFAZOLIN (URINE) Value in next row Resistant      >=32 RESISTANTThis is a modified FDA-approved test that has been validated and its performance characteristics determined by the reporting laboratory.  This  laboratory is certified under the Clinical Laboratory Improvement Amendments CLIA as qualified to perform high complexity clinical laboratory testing.    CEFEPIME Value in next row Sensitive      >=32 RESISTANTThis is a modified FDA-approved test that has been validated and its performance characteristics determined by the reporting laboratory.  This laboratory is certified under the Clinical Laboratory Improvement Amendments CLIA as qualified to perform high complexity clinical laboratory testing.    ERTAPENEM Value in next row Sensitive      >=32 RESISTANTThis is a modified FDA-approved test that has been validated and its performance characteristics determined by the reporting laboratory.  This laboratory is certified under the Clinical Laboratory Improvement Amendments CLIA as qualified to perform high complexity clinical laboratory testing.    CEFTRIAXONE  Value in next row Sensitive      >=32 RESISTANTThis is a modified FDA-approved test that has been validated and its performance characteristics determined by the reporting laboratory.  This laboratory is certified under the Clinical Laboratory Improvement Amendments CLIA as qualified to perform high complexity clinical laboratory testing.    CIPROFLOXACIN  Value in next row Resistant      >=32 RESISTANTThis is a modified FDA-approved test that has been validated and its performance characteristics determined by the reporting laboratory.  This laboratory is certified under the Clinical Laboratory Improvement Amendments CLIA as qualified to perform high complexity clinical laboratory testing.    GENTAMICIN Value in next row Sensitive      >=32 RESISTANTThis is a modified FDA-approved test that has been validated and its performance characteristics determined by the reporting laboratory.  This laboratory is certified under the Clinical Laboratory Improvement Amendments CLIA as qualified to perform high complexity clinical laboratory testing.     NITROFURANTOIN Value in next row Sensitive      >=32 RESISTANTThis is a modified FDA-approved test that has been validated and its performance characteristics determined by the reporting laboratory.  This laboratory is certified under the Clinical Laboratory Improvement Amendments CLIA as qualified to perform high complexity clinical laboratory testing.    TRIMETH/SULFA Value in next row Sensitive      >=32 RESISTANTThis is a modified FDA-approved test that has been validated and its performance characteristics determined by the reporting laboratory.  This laboratory is certified under the Clinical Laboratory Improvement Amendments CLIA as qualified to perform high complexity clinical laboratory testing.    AMPICILLIN/SULBACTAM Value in next row Resistant      >=32 RESISTANTThis is a modified FDA-approved test that has been validated and its performance characteristics determined by the reporting laboratory.  This laboratory is certified under the Clinical Laboratory Improvement Amendments CLIA as qualified to perform high complexity clinical laboratory testing.    PIP/TAZO Value in next row Resistant      >=128 RESISTANTThis is a modified FDA-approved test that has been validated and its performance characteristics determined by the reporting laboratory.  This laboratory is  certified under the Clinical Laboratory Improvement Amendments CLIA as qualified to perform high complexity clinical laboratory testing.    MEROPENEM Value in next row Sensitive      >=128 RESISTANTThis is a modified FDA-approved test that has been validated and its performance characteristics determined by the reporting laboratory.  This laboratory is certified under the Clinical Laboratory Improvement Amendments CLIA as qualified to perform high complexity clinical laboratory testing.    * >=100,000 COLONIES/mL ESCHERICHIA COLI  Resp panel by RT-PCR (RSV, Flu A&B, Covid) Anterior Nasal Swab     Status: Abnormal   Collection Time:  02/01/24  5:18 PM   Specimen: Anterior Nasal Swab  Result Value Ref Range Status   SARS Coronavirus 2 by RT PCR NEGATIVE NEGATIVE Final    Comment: (NOTE) SARS-CoV-2 target nucleic acids are NOT DETECTED.  The SARS-CoV-2 RNA is generally detectable in upper respiratory specimens during the acute phase of infection. The lowest concentration of SARS-CoV-2 viral copies this assay can detect is 138 copies/mL. A negative result does not preclude SARS-Cov-2 infection and should not be used as the sole basis for treatment or other patient management decisions. A negative result may occur with  improper specimen collection/handling, submission of specimen other than nasopharyngeal swab, presence of viral mutation(s) within the areas targeted by this assay, and inadequate number of viral copies(<138 copies/mL). A negative result must be combined with clinical observations, patient history, and epidemiological information. The expected result is Negative.  Fact Sheet for Patients:  bloggercourse.com  Fact Sheet for Healthcare Providers:  seriousbroker.it  This test is no t yet approved or cleared by the [place]  FDA and  has been authorized for detection and/or diagnosis of SARS-CoV-2 by FDA under an Emergency Use Authorization (EUA). This EUA will remain  in effect (meaning this test can be used) for the duration of the COVID-19 declaration under Section 564(b)(1) of the Act, 21 U.S.C.section 360bbb-3(b)(1), unless the authorization is terminated  or revoked sooner.       Influenza A by PCR POSITIVE (A) NEGATIVE Final   Influenza B by PCR NEGATIVE NEGATIVE Final    Comment: (NOTE) The Xpert Xpress SARS-CoV-2/FLU/RSV plus assay is intended as an aid in the diagnosis of influenza from Nasopharyngeal swab specimens and should not be used as a sole basis for treatment. Nasal washings and aspirates are unacceptable for Xpert Xpress  SARS-CoV-2/FLU/RSV testing.  Fact Sheet for Patients: bloggercourse.com  Fact Sheet for Healthcare Providers: seriousbroker.it  This test is not yet approved or cleared by the [place]  FDA and has been authorized for detection and/or diagnosis of SARS-CoV-2 by FDA under an Emergency Use Authorization (EUA). This EUA will remain in effect (meaning this test can be used) for the duration of the COVID-19 declaration under Section 564(b)(1) of the Act, 21 U.S.C. section 360bbb-3(b)(1), unless the authorization is terminated or revoked.     Resp Syncytial Virus by PCR NEGATIVE NEGATIVE Final    Comment: (NOTE) Fact Sheet for Patients: bloggercourse.com  Fact Sheet for Healthcare Providers: seriousbroker.it  This test is not yet approved or cleared by the [place]  FDA and has been authorized for detection and/or diagnosis of SARS-CoV-2 by FDA under an Emergency Use Authorization (EUA). This EUA will remain in effect (meaning this test can be used) for the duration of the COVID-19 declaration under Section 564(b)(1) of the Act, 21 U.S.C. section 360bbb-3(b)(1), unless the authorization is terminated or revoked.  Performed at [facility], [address] [place], [place] [address]  Time coordinating discharge: 35 minutes  SIGNED:   Derryl Duval, MD  [facility] 02/06/2024, 12:38 PM       [1]  Allergies Allergen Reactions   Bactrim [Sulfamethoxazole-Trimethoprim] Nausea And Vomiting   Sulfa Antibiotics Nausea And Vomiting   Clonidine And Derivatives Other (See Comments)    FATIGUE   Evista [Raloxifene Hcl] Other (See Comments)    GERD   Fosamax [Alendronate Sodium] Other (See Comments)    INCREASED GERD    Glipizide Other (See Comments)    PALPITATIONS   Lipitor [Atorvastatin Calcium ] Other (See Comments)    ACHING   Losartan  Other (See  Comments)    FATIGUE    Pravastatin Other (See Comments)    ACHING   Azithromycin Rash and Other (See Comments)    FACIAL BURNING

## 2024-02-06 NOTE — Plan of Care (Signed)

## 2024-02-06 NOTE — TOC Transition Note (Signed)
 Transition of Care [facility]) - Discharge Note   Patient Details  Name: Crystal [name] MRN: [id number] Date of Birth: [birth year]  Transition of [facility]) CM/SW Contact:  Ronnald MARLA Sil, RN Phone Number: 02/06/2024, 1:25 PM   Clinical Narrative:    Patient discussed during Progression rounds this morning, team made aware of bed availability at [facility], facility liaison - Isaiah made aware patient to discharge / transport via [facility].  Nurse - Damien provided Rm # [phone] and Ph [phone] to call report.  D/C Summary & Transfer Report forwarded to SNF via Epic Hub.   Final next level of care: Skilled Nursing Facility Barriers to Discharge: No Barriers Identified   Patient Goals and CMS Choice Patient states their goals for this hospitalization and ongoing recovery are:: SNF for Rehab CMS Medicare.gov Compare Post Acute Care list provided to:: Patient Choice offered to / list presented to : Patient   Discharge Placement  Patient chooses bed at: [facility] Patient to be transferred to facility by: [facility] Name of family member notified: Niece - [place]  Patient and family notified of of transfer: 02/06/24  Discharge Plan and Services Additional resources added to the After Visit Summary for   In-house Referral: Clinical Social Work Discharge Planning Services: CM Consult            DME Arranged: N/A DME Agency: NA  Social Drivers of Health (SDOH) Interventions SDOH Screenings   Food Insecurity: No Food Insecurity (02/02/2024)  Housing: Low Risk (02/02/2024)  Transportation Needs: No Transportation Needs (02/02/2024)  Utilities: Not At Risk (02/02/2024)  Financial Resource Strain: Low Risk (06/24/2023)   Received from [facility]  Physical Activity: Insufficiently Active (06/24/2023)   Received from [facility]  Social Connections: Moderately Isolated (02/02/2024)  Stress: Stress Concern Present (06/24/2023)    Received from [facility]  Tobacco Use: Low Risk (02/01/2024)  Health Literacy: Low Risk (06/24/2023)   Received from [facility]     Readmission Risk Interventions    02/04/2024    4:05 PM 10/01/2021    8:08 AM  Readmission Risk Prevention Plan  Transportation Screening Complete Complete  HRI or Home Care Consult Complete Complete  Social Work Consult for Recovery Care Planning/Counseling Complete Complete  Palliative Care Screening Not Applicable Not Applicable  Medication Review Oceanographer) Complete Complete
